# Patient Record
Sex: Male | Born: 1940 | Race: White | Hispanic: No | Smoking: Former smoker
Health system: Southern US, Community
[De-identification: ages and names within clinical notes are randomized; demographics above are authoritative.]

## PROBLEM LIST (undated history)

## (undated) ENCOUNTER — Ambulatory Visit: Admission: EM | Payer: Medicare Other | Source: Home / Self Care

## (undated) DIAGNOSIS — I4510 Unspecified right bundle-branch block: Secondary | ICD-10-CM

## (undated) DIAGNOSIS — E785 Hyperlipidemia, unspecified: Secondary | ICD-10-CM

## (undated) DIAGNOSIS — I480 Paroxysmal atrial fibrillation: Secondary | ICD-10-CM

## (undated) DIAGNOSIS — I10 Essential (primary) hypertension: Secondary | ICD-10-CM

## (undated) DIAGNOSIS — E119 Type 2 diabetes mellitus without complications: Secondary | ICD-10-CM

## (undated) DIAGNOSIS — I2510 Atherosclerotic heart disease of native coronary artery without angina pectoris: Secondary | ICD-10-CM

## (undated) DIAGNOSIS — I739 Peripheral vascular disease, unspecified: Secondary | ICD-10-CM

## (undated) DIAGNOSIS — I255 Ischemic cardiomyopathy: Secondary | ICD-10-CM

## (undated) HISTORY — DX: Hyperlipidemia, unspecified: E78.5

## (undated) HISTORY — DX: Peripheral vascular disease, unspecified: I73.9

## (undated) HISTORY — DX: Essential (primary) hypertension: I10

## (undated) HISTORY — DX: Atherosclerotic heart disease of native coronary artery without angina pectoris: I25.10

## (undated) HISTORY — DX: Type 2 diabetes mellitus without complications: E11.9

## (undated) HISTORY — DX: Unspecified right bundle-branch block: I45.10

## (undated) HISTORY — DX: Ischemic cardiomyopathy: I25.5

## (undated) HISTORY — DX: Paroxysmal atrial fibrillation: I48.0

---

## 1997-10-25 HISTORY — PX: CHOLECYSTECTOMY: SHX55

## 1999-01-16 ENCOUNTER — Ambulatory Visit (HOSPITAL_COMMUNITY): Admission: RE | Admit: 1999-01-16 | Discharge: 1999-01-16 | Payer: Self-pay | Admitting: Cardiology

## 1999-01-16 HISTORY — PX: CARDIAC CATHETERIZATION: SHX172

## 1999-01-29 ENCOUNTER — Ambulatory Visit (HOSPITAL_COMMUNITY): Admission: RE | Admit: 1999-01-29 | Discharge: 1999-01-29 | Payer: Self-pay | Admitting: Cardiology

## 1999-01-29 ENCOUNTER — Encounter: Payer: Self-pay | Admitting: Cardiology

## 1999-05-21 ENCOUNTER — Ambulatory Visit (HOSPITAL_COMMUNITY): Admission: RE | Admit: 1999-05-21 | Discharge: 1999-05-21 | Payer: Self-pay | Admitting: Internal Medicine

## 1999-05-21 ENCOUNTER — Encounter: Payer: Self-pay | Admitting: Internal Medicine

## 1999-05-21 HISTORY — PX: CARDIAC ELECTROPHYSIOLOGY STUDY AND ABLATION: SHX1294

## 2007-12-28 ENCOUNTER — Ambulatory Visit: Admission: RE | Admit: 2007-12-28 | Discharge: 2007-12-28 | Payer: Self-pay | Admitting: Family Medicine

## 2007-12-28 ENCOUNTER — Encounter (INDEPENDENT_AMBULATORY_CARE_PROVIDER_SITE_OTHER): Payer: Self-pay | Admitting: Orthopedic Surgery

## 2007-12-28 ENCOUNTER — Ambulatory Visit: Payer: Self-pay | Admitting: Vascular Surgery

## 2008-01-09 ENCOUNTER — Ambulatory Visit: Payer: Self-pay | Admitting: Vascular Surgery

## 2008-01-15 ENCOUNTER — Ambulatory Visit (HOSPITAL_COMMUNITY): Admission: RE | Admit: 2008-01-15 | Discharge: 2008-01-15 | Payer: Self-pay | Admitting: Vascular Surgery

## 2010-09-22 ENCOUNTER — Ambulatory Visit: Payer: Self-pay | Admitting: Cardiology

## 2010-11-15 ENCOUNTER — Encounter: Payer: Self-pay | Admitting: Vascular Surgery

## 2011-02-05 ENCOUNTER — Other Ambulatory Visit: Payer: Self-pay | Admitting: *Deleted

## 2011-02-05 DIAGNOSIS — I739 Peripheral vascular disease, unspecified: Secondary | ICD-10-CM

## 2011-02-05 MED ORDER — CILOSTAZOL 100 MG PO TABS: 100.0000 mg | ORAL_TABLET | Freq: Two times a day (BID) | ORAL | Status: DC

## 2011-02-05 NOTE — Telephone Encounter (Signed)
escribe medication per fax request  

## 2011-03-09 NOTE — Op Note (Signed)
NAME:  ANDRETTI, STONES                  ACCOUNT NO.:  [id number]   MEDICAL RECORD NO.:  [id number]          PATIENT TYPE:  AMB   LOCATION:  SDS                          FACILITY:  [facility]   PHYSICIAN:  Judeth Cornfield. Scot Dock, M.D.DATE OF BIRTH:  January 07, 1941   DATE OF PROCEDURE:  01/15/2008  DATE OF DISCHARGE:                               OPERATIVE REPORT   PREOPERATIVE DIAGNOSIS:  Rest pain of the right foot, with infrainguinal  arterial occlusive disease   POSTOPERATIVE DIAGNOSIS:  Rest pain of the right foot, with  infrainguinal arterial occlusive disease.   PROCEDURES:  1. Ultrasound-guided left common femoral artery access.  2. Aortogram, with bilateral iliac arteriogram and bilateral lower      extremity runoff.  3. Selective catheterization of the right external iliac artery.   SURGEON:  Judeth Cornfield. Scot Dock, M.D.   ASSISTANT:  Nurse.   ANESTHESIA:  Local, with sedation.   INDICATIONS:  This is a 70 year old gentleman who presented with  progressive ischemic symptoms in his right lower extremity.  He had  claudication, which progressed to the point of rest pain.  His symptoms  had come on gradually.  He had an ABI of 43% on the right, and by exam  he had evidence of infrainguinal arterial occlusive disease.  He was  brought in for diagnostic arteriography to determine what his options  would be for revascularization.   TECHNIQUE:  The patient was taken to the PV lab at [facility] and sedated with  1 mg of Versed and 50 mcg of fentanyl.  Both groins were prepped and  draped in the usual sterile fashion.  The skin was anesthetized with 1%  lidocaine on the left, and under ultrasound guidance the left common  femoral artery was cannulated and a guidewire introduced into the  infrarenal aorta under fluoroscopic control.  The tract over the wire  was dilated, and then a dilator and peel-away sheath were advanced over  the wire, and the dilator was removed.  The pigtail catheter was  positioned at the L1 vertebral body and flush aortogram obtained.  Catheter was then repositioned above the aortic bifurcation, and oblique  iliac projections were obtained.  Next, the pigtail catheter was  exchanged for an IMA catheter, which was positioned into the right  external iliac artery.  An angled Glidewire was advanced down into the  external iliac artery, and then the IMA catheter was exchanged for an  end-hole catheter, which was passed down into the external iliac artery.  Selective right external iliac arteriogram was obtained, with additional  spot films obtained of the right leg.  This catheter was then removed  over a wire, and left lower extremity runoff films obtained through the  left femoral sheath.   FINDINGS:  There is a small accessory renal artery on the right.  The  superior renal artery on the right has an approximately 40% proximal  stenosis.  There is a single renal artery on the left, with no  significant stenosis noted.  The infrarenal aorta, bilateral common  iliac arteries, bilateral external iliac arteries, and  bilateral  hypogastric arteries are patent.   On the right side, the common femoral, superficial femoral, and deep  femoral artery are patent.  The superficial femoral artery is then  occluded at the adductor canal, with some mild disease just proximal to  this.  There is reconstitution of a blind popliteal segment below the  knee which is quite diseased.  The anterior tibial, posterior tibial,  and peroneal arteries are occluded.  There is reconstitution of a blind  peroneal segment in the midsegment of the right leg.  The peroneal  artery is then occluded.  There is then reconstitution of the dorsalis  pedis artery in the right foot.  The artery is somewhat small but  patent.   On the left side, the common femoral, superficial femoral, and deep  femoral arteries are patent.  There is some mild diffuse disease of the  popliteal artery on the  left.  The popliteal artery is then occluded  below the knee.  The anterior tibial, posterior tibial, and peroneal  arteries are occluded.  Again, there is reconstitution of a blind  peroneal segment and the mid to distal third of the left leg.  There is  also reconstitution of the dorsalis pedis artery in the foot, which is  small but patent.   CONCLUSIONS:  1. Right superficial femoral artery occlusion at the adductor canal,      with severe tibial occlusive disease.  2. Occluded popliteal artery on the left, with severe tibial occlusive      disease.      Judeth Cornfield. Scot Dock, M.D.  Electronically Signed     CSD/MEDQ  D:  01/15/2008  T:  01/15/2008  Job:  [id number]   cc:   Claris Gower, M.D.

## 2011-03-09 NOTE — Consult Note (Signed)
NEW PATIENT CONSULTATION   Steve [name]  DOB:  01-18-1941                                       01/09/2008  [id number]   I saw the patient in the office today in consultation concerning  claudication in the right lower extremity and rest pain in the right  foot.  He was referred by Dr. Arelia Sneddon.  This is a pleasant 70 year old  gentleman who states that he has had some right calf claudication for  some time, but over the last 3 weeks, his symptoms have gradually become  worse.  He experiences pain in the right calf associated with ambulating  less than 20 yards.  This pain is relieved with rest.  He has also  developed some rest pain in the right foot.  He has no history of non-  healing ulcers.  He has had no significant thigh or hip claudication on  the right.  He has had no significant symptoms in the left leg.  He was  having some significant pain in his right great toe, but this pain has  improved.   PAST MEDICAL HISTORY:  Significant for non-insulin-dependent diabetes.  He was diagnosed with this in 2001.  He additionally has hypertension.  He also has elevated triglycerides.  He denies any history of previous  myocardial infarction, history of congestive heart failure, or history  of COPD.   FAMILY HISTORY:  There is no history of premature cardiovascular  disease.  His mother died from an intracranial bleed at age 31.   SOCIAL HISTORY:  He is married.  He has 1 child.  He does not smoke  tobacco.  He has 1 to 2 drinks a day.   REVIEW OF SYSTEMS AND MEDICATIONS:  Documented on the medical history  form in his chart.   PHYSICAL EXAMINATION:  This is a pleasant 69 year old gentleman who  appears his stated age.  Blood pressure is 147/83, heart rate is 72.  I  do not detect any carotid bruits.  There is no cervical lymphadenopathy.  Neck is supple.  Lungs are clear bilaterally to auscultation.  On  cardiac exam, he has a regular rate and rhythm.   Abdomen is soft and  nontender . I cannot palpate an aneurysm.  He has normal-pitched bowel  sounds.  He has palpable femoral pulses, which are normal.  I cannot  palpate a right popliteal or pedal pulse on the right.  He does have a  palpable left popliteal pulse with no pedal pulses on the left.  Both  feet are somewhat cool.  He has no ischemic ulcers.  He has no  significant lower extremity swelling.  Neurologic exam is nonfocal.   He did have a Doppler study done at [facility], which showed an ABI of  43% on the right and 76% on the left.  Great toe pressure on the right  was 30 mmHg and on the left 50 mmHg.   This patient has evidence of infrainguinal arterial occlusive disease  bilaterally, but more significantly on the right side.  Given that his  symptoms have progressed to the point of rest pain, I have recommended  that we proceed with arteriography to see what options he has for  revascularization.  I suspect that he has diffuse multilevel disease and  is likely not a good candidate for  endovascular approach.  We have  discussed the indications for arteriography and the potential  complications, including, but not limited to bleeding, arterial injury.  We have also discussed potentially proceeding with angioplasty if we  find a lesion amenable to angioplasty.  Again, we have discussed the  potential complications associated with this.  All of his questions are  answered and he is agreeable to proceed.  His procedure has been  scheduled for 01/21/2008.   Judeth Cornfield. Scot Dock, M.D.  Electronically Signed   CSD/MEDQ  D:  01/09/2008  T:  01/10/2008  Job:  [id number]   cc:   Claris Gower, M.D.

## 2011-03-11 ENCOUNTER — Other Ambulatory Visit: Payer: Self-pay | Admitting: Cardiology

## 2011-03-12 NOTE — Telephone Encounter (Signed)
escribe medication per fax request  

## 2011-05-27 ENCOUNTER — Encounter: Payer: Self-pay | Admitting: *Deleted

## 2011-05-31 ENCOUNTER — Encounter: Payer: Self-pay | Admitting: Cardiology

## 2011-05-31 ENCOUNTER — Ambulatory Visit (INDEPENDENT_AMBULATORY_CARE_PROVIDER_SITE_OTHER): Payer: Medicare Other | Admitting: Cardiology

## 2011-05-31 DIAGNOSIS — I739 Peripheral vascular disease, unspecified: Secondary | ICD-10-CM | POA: Insufficient documentation

## 2011-05-31 DIAGNOSIS — I10 Essential (primary) hypertension: Secondary | ICD-10-CM

## 2011-05-31 DIAGNOSIS — E785 Hyperlipidemia, unspecified: Secondary | ICD-10-CM

## 2011-05-31 DIAGNOSIS — E119 Type 2 diabetes mellitus without complications: Secondary | ICD-10-CM | POA: Insufficient documentation

## 2011-05-31 NOTE — Progress Notes (Signed)
Steve [name] Date of Birth: April 21, 1941   History of Present Illness: Steve [name] is seen today for followup. He states he is feeling very well. He still has discomfort in his feet and legs particularly at night with a burning sensation. His claudication symptoms are actually improved. It was noted on his recent blood work that his triglycerides were elevated to 334. His weight has also increased 3 pounds. He attributes this to eating too much sweets. His last A1c was 6%. He denies any chest pain or shortness of breath.  Current Outpatient Prescriptions on File Prior to Visit  Medication Sig Dispense Refill  . amLODipine (NORVASC) 5 MG tablet Take 5 mg by mouth daily.        Marland Kitchen atenolol (TENORMIN) 100 MG tablet Take 100 mg by mouth daily.        . cilostazol (PLETAL) 100 MG tablet TAKE 1 TABLET BY MOUTH TWICE A DAY  60 tablet  5  . fenofibrate micronized (LOFIBRA) 200 MG capsule Take 200 mg by mouth daily before breakfast.        . glyBURIDE (DIABETA) 5 MG tablet Take 5 mg by mouth 2 (two) times daily with a meal.        . lisinopril (PRINIVIL,ZESTRIL) 10 MG tablet Take 10 mg by mouth daily.        Marland Kitchen METFORMIN HCL PO Take by mouth.        . niacin 500 MG tablet Take 500 mg by mouth daily with breakfast.          No Known Allergies  Past Medical History  Diagnosis Date  . Diabetes mellitus     type 2  . Hypertension   . Peripheral arterial disease   . Hyperlipidemia     Past Surgical History  Procedure Date  . Cholecystectomy 1999  . Cardiac catheterization 01/16/1999    Est. EF of 65% -- Nonobstruction atherosclerotic coronary artery disease -- Normal left ventricular function      . Cardiac electrophysiology study and ablation 05/21/1999    Normal sinus funtion -- Mildly prolonged interatrial conduction times -- Normal A-V node funtion -- Normal His Purkinje system function -- fNo accessory pathway -- No inducible ventricular tachycardia in the presence of the or in the absence of  isoproterenol with programmed stimulation or with burst pacing -- Nikki Dom, M.D. FACC surgan            History  Smoking status  . Former Smoker -- 1.5 packs/day for 20 years  . Types: Cigarettes  . Quit date: 05/27/1979  Smokeless tobacco  . Not on file    History  Alcohol Use  . Yes    Family History  Problem Relation Age of Onset  . Stroke Mother 23    Cerebellar hemorrhage    Review of Systems: The review of systems is positive for exertional calf discomfort which has improved.  All other systems were reviewed and are negative.  Physical Exam: BP 138/76  Pulse 64  Ht 5\' 11"  (1.803 m)  Wt 205 lb 6.4 oz (93.169 kg)  BMI 28.65 kg/m2 Is an obese white male in no acute distress. His HEENT exam is unremarkable. He has no JVD, adenopathy, thyromegaly, or bruits. Lungs are clear. Cardiac exam reveals a regular rate and rhythm without gallop, murmur, or click. Abdomen is obese, soft, nontender. He has no masses or bruits. Extremities are without edema. He has poor pedal pulses. Skin is warm and dry. He  is alert and oriented x4. Cranial nerves II through XII are intact. LABORATORY DATA:   Assessment / Plan:

## 2011-05-31 NOTE — Patient Instructions (Signed)
Continue your current medications.  Cut out the sweets.  I will see you again in 6 months.

## 2011-05-31 NOTE — Assessment & Plan Note (Signed)
His claudication symptoms are improved on Pletal. Continue regular walking program.

## 2011-05-31 NOTE — Assessment & Plan Note (Signed)
Recent lipid panel showed total cholesterol 168, HDL 28, and triglycerides of 334. I stressed the importance of dietary modification with particular attention to avoid sweets. He needs to lose weight. He needs to continue aerobic activity.

## 2011-05-31 NOTE — Assessment & Plan Note (Signed)
Blood pressure is well controlled today. We'll continue with his lisinopril, amlodipine, and atenolol.

## 2011-07-19 LAB — COMPREHENSIVE METABOLIC PANEL
AST: 40 — ABNORMAL HIGH
Albumin: 4.1
Alkaline Phosphatase: 27 — ABNORMAL LOW
CO2: 23
Chloride: 102
Creatinine, Ser: 1.24
GFR calc Af Amer: >60
GFR calc non Af Amer: 58 — ABNORMAL LOW
Potassium: 3.8
Total Bilirubin: 0.8

## 2011-07-19 LAB — CBC
HCT: 37.7 — ABNORMAL LOW
MCV: 93.3
RBC: 4.04 — ABNORMAL LOW
WBC: 5.1

## 2011-11-30 ENCOUNTER — Other Ambulatory Visit: Payer: Self-pay | Admitting: *Deleted

## 2011-11-30 MED ORDER — CILOSTAZOL 100 MG PO TABS: 100.0000 mg | ORAL_TABLET | Freq: Two times a day (BID) | ORAL | Status: DC

## 2011-12-07 ENCOUNTER — Ambulatory Visit (INDEPENDENT_AMBULATORY_CARE_PROVIDER_SITE_OTHER): Payer: Medicare Other | Admitting: Cardiology

## 2011-12-07 ENCOUNTER — Other Ambulatory Visit: Payer: Self-pay | Admitting: Cardiology

## 2011-12-07 ENCOUNTER — Ambulatory Visit (INDEPENDENT_AMBULATORY_CARE_PROVIDER_SITE_OTHER)
Admission: RE | Admit: 2011-12-07 | Discharge: 2011-12-07 | Disposition: A | Discharge status: Home / Self Care | Payer: Medicare Other | Source: Ambulatory Visit | Attending: Cardiology | Admitting: Cardiology

## 2011-12-07 ENCOUNTER — Encounter (HOSPITAL_COMMUNITY): Payer: Self-pay | Admitting: Pharmacy Technician

## 2011-12-07 ENCOUNTER — Encounter: Payer: Self-pay | Admitting: Cardiology

## 2011-12-07 DIAGNOSIS — Z01818 Encounter for other preprocedural examination: Secondary | ICD-10-CM

## 2011-12-07 DIAGNOSIS — I10 Essential (primary) hypertension: Secondary | ICD-10-CM

## 2011-12-07 DIAGNOSIS — I200 Unstable angina: Secondary | ICD-10-CM

## 2011-12-07 DIAGNOSIS — E119 Type 2 diabetes mellitus without complications: Secondary | ICD-10-CM

## 2011-12-07 DIAGNOSIS — E785 Hyperlipidemia, unspecified: Secondary | ICD-10-CM

## 2011-12-07 DIAGNOSIS — I779 Disorder of arteries and arterioles, unspecified: Secondary | ICD-10-CM

## 2011-12-07 DIAGNOSIS — I2510 Atherosclerotic heart disease of native coronary artery without angina pectoris: Secondary | ICD-10-CM

## 2011-12-07 DIAGNOSIS — I739 Peripheral vascular disease, unspecified: Secondary | ICD-10-CM

## 2011-12-07 DIAGNOSIS — Z79899 Other long term (current) drug therapy: Secondary | ICD-10-CM

## 2011-12-07 DIAGNOSIS — Z951 Presence of aortocoronary bypass graft: Secondary | ICD-10-CM | POA: Insufficient documentation

## 2011-12-07 LAB — BASIC METABOLIC PANEL
CO2: 22 mEq/L (ref 19–32)
Glucose, Bld: 284 mg/dL — ABNORMAL HIGH (ref 70–99)
Potassium: 4.2 mEq/L (ref 3.5–5.1)
Sodium: 137 mEq/L (ref 135–145)

## 2011-12-07 LAB — CBC WITH DIFFERENTIAL/PLATELET
Basophils Absolute: 0 10*3/uL (ref 0.0–0.1)
Eosinophils Absolute: 0.1 10*3/uL (ref 0.0–0.7)
HCT: 37.9 % — ABNORMAL LOW (ref 39.0–52.0)
Hemoglobin: 13.1 g/dL (ref 13.0–17.0)
Lymphs Abs: 1.2 10*3/uL (ref 0.7–4.0)
MCHC: 34.7 g/dL (ref 30.0–36.0)
Neutro Abs: 4.7 10*3/uL (ref 1.4–7.7)
RDW: 13.1 % (ref 11.5–14.6)

## 2011-12-07 MED ORDER — NITROGLYCERIN 0.4 MG SL SUBL: 0.4000 mg | SUBLINGUAL_TABLET | SUBLINGUAL | Status: DC | PRN

## 2011-12-07 MED ORDER — ASPIRIN EC 81 MG PO TBEC: 81.0000 mg | DELAYED_RELEASE_TABLET | Freq: Every day | ORAL | Status: DC

## 2011-12-07 NOTE — Assessment & Plan Note (Signed)
He has a crescendo pattern of chest pain concerning for unstable angina. He has known coronary disease by cardiac catheterization in 2000 but was nonobstructive at that time. He has multiple cardiac risk factors. Given his very typical symptoms I recommended proceeding directly to cardiac catheterization with potential intervention if indicated. Procedure and risks were discussed in detail. Patient is agreeable to proceed. Baseline blood work and chest x-ray will be obtained today.

## 2011-12-07 NOTE — Assessment & Plan Note (Signed)
He'll need to hold his metformin for cardiac catheterization for 48 hours post procedure.

## 2011-12-07 NOTE — Assessment & Plan Note (Addendum)
Patient does have poor pedal pulses. We may need to consider extremity Doppler studies at a later date.

## 2011-12-07 NOTE — Patient Instructions (Addendum)
We will schedule you for a cardiac cath with possible intervention.  We will get blood work today and a chest Xray.  Take nitroglycerin sublingual prn chest pain. If pain is unresolved call 911.

## 2011-12-07 NOTE — Progress Notes (Signed)
[name] Date of Birth: Jun 18, 1941   History of Present Illness: [name] is seen today for followup. Over the past 2 months he has been experiencing symptoms of increased chest pain. He describes this as this central mid substernal chest pain without radiation. He denies any shortness of breath. He states he can no longer do anything physical without developing chest pain. This includes shoveling snow or raking. Typically if he rests his pain goes away within 15 minutes. He has noticed just walking up incline from his mailbox gives him chest pain. Today he did develop some mild chest discomfort just while drinking coffee. He has a known history of coronary disease with cardiac catheterization in 2000 showing nonobstructive disease. He does have multiple cardiac risk factors including diabetes, hypertension, and hyperlipidemia.  Current Outpatient Prescriptions on File Prior to Visit  Medication Sig Dispense Refill  . amLODipine (NORVASC) 5 MG tablet Take 5 mg by mouth daily.        Marland Kitchen atenolol (TENORMIN) 100 MG tablet Take 100 mg by mouth daily.        . fenofibrate micronized (LOFIBRA) 200 MG capsule Take 200 mg by mouth daily before breakfast.        . glyBURIDE (DIABETA) 5 MG tablet Take 5 mg by mouth 2 (two) times daily with a meal.        . lisinopril (PRINIVIL,ZESTRIL) 10 MG tablet Take 10 mg by mouth daily.        . niacin 500 MG tablet Take 500 mg by mouth daily with breakfast.        . DISCONTD: cilostazol (PLETAL) 100 MG tablet Take 1 tablet (100 mg total) by mouth 2 (two) times daily.  60 tablet  5    No Known Allergies  Past Medical History  Diagnosis Date  . Diabetes mellitus     type 2  . Hypertension   . Peripheral arterial disease   . Hyperlipidemia     Past Surgical History  Procedure Date  . Cholecystectomy 1999  . Cardiac catheterization 01/16/1999    Est. EF of 65% -- Nonobstruction atherosclerotic coronary artery disease -- Normal left ventricular  function      . Cardiac electrophysiology study and ablation 05/21/1999    Normal sinus funtion -- Mildly prolonged interatrial conduction times -- Normal A-V node funtion -- Normal His Purkinje system function -- fNo accessory pathway -- No inducible ventricular tachycardia in the presence of the or in the absence of isoproterenol with programmed stimulation or with burst pacing -- Nikki Dom, M.D. FACC surgan            History  Smoking status  . Former Smoker -- 1.5 packs/day for 20 years  . Types: Cigarettes  . Quit date: 05/27/1979  Smokeless tobacco  . Not on file    History  Alcohol Use  . Yes    Family History  Problem Relation Age of Onset  . Stroke Mother 68    Cerebellar hemorrhage    Review of Systems: The review of systems is positive for some exertional calf discomfort. He describes a burning sensation on the soles of his feet and at times his toes feel like they're on fire. He only has cramping in his calves at night. All other systems were reviewed and are negative.  Physical Exam: BP 124/66  Pulse 72  Ht 5\' 11"  (1.803 m)  Wt 205 lb 3.2 oz (93.078 kg)  BMI 28.62 kg/m2 Is an  obese white male in no acute distress. His HEENT exam is unremarkable. He has no JVD, adenopathy, thyromegaly, or bruits. Lungs are clear. Cardiac exam reveals a regular rate and rhythm without gallop, murmur, or click. Abdomen is obese, soft, nontender. He has no masses or bruits. Extremities are without edema. He has poor pedal pulses. Skin is warm and dry. He is alert and oriented x4. Cranial nerves II through XII are intact. LABORATORY DATA: ECG today demonstrates normal sinus rhythm with a left anterior fascicular block. He has voltage criteria for LVH. He has nonspecific T-wave abnormality.  Assessment / Plan:

## 2011-12-07 NOTE — Assessment & Plan Note (Signed)
He has a history of statin intolerance. He is now on fenofibrate and niacin.

## 2011-12-09 ENCOUNTER — Inpatient Hospital Stay (HOSPITAL_COMMUNITY)
Admission: RE | Admit: 2011-12-09 | Discharge: 2011-12-17 | DRG: 234 | Disposition: A | Discharge status: Home / Self Care | Payer: Medicare Other | Source: Ambulatory Visit | Attending: Cardiothoracic Surgery | Admitting: Cardiothoracic Surgery

## 2011-12-09 ENCOUNTER — Encounter (HOSPITAL_COMMUNITY)
Admission: RE | Disposition: A | Discharge status: Home / Self Care | Payer: Self-pay | Source: Ambulatory Visit | Attending: Cardiothoracic Surgery

## 2011-12-09 ENCOUNTER — Other Ambulatory Visit: Payer: Self-pay

## 2011-12-09 DIAGNOSIS — D62 Acute posthemorrhagic anemia: Secondary | ICD-10-CM | POA: Diagnosis not present

## 2011-12-09 DIAGNOSIS — E785 Hyperlipidemia, unspecified: Secondary | ICD-10-CM | POA: Diagnosis present

## 2011-12-09 DIAGNOSIS — I200 Unstable angina: Secondary | ICD-10-CM | POA: Diagnosis present

## 2011-12-09 DIAGNOSIS — I10 Essential (primary) hypertension: Secondary | ICD-10-CM | POA: Diagnosis present

## 2011-12-09 DIAGNOSIS — D696 Thrombocytopenia, unspecified: Secondary | ICD-10-CM | POA: Diagnosis not present

## 2011-12-09 DIAGNOSIS — K5900 Constipation, unspecified: Secondary | ICD-10-CM | POA: Diagnosis not present

## 2011-12-09 DIAGNOSIS — I2510 Atherosclerotic heart disease of native coronary artery without angina pectoris: Principal | ICD-10-CM | POA: Diagnosis present

## 2011-12-09 DIAGNOSIS — Z951 Presence of aortocoronary bypass graft: Secondary | ICD-10-CM | POA: Diagnosis present

## 2011-12-09 DIAGNOSIS — Z01810 Encounter for preprocedural cardiovascular examination: Secondary | ICD-10-CM

## 2011-12-09 DIAGNOSIS — Z87891 Personal history of nicotine dependence: Secondary | ICD-10-CM

## 2011-12-09 DIAGNOSIS — I471 Supraventricular tachycardia, unspecified: Secondary | ICD-10-CM | POA: Diagnosis not present

## 2011-12-09 DIAGNOSIS — I4891 Unspecified atrial fibrillation: Secondary | ICD-10-CM | POA: Diagnosis not present

## 2011-12-09 DIAGNOSIS — I4949 Other premature depolarization: Secondary | ICD-10-CM | POA: Diagnosis not present

## 2011-12-09 DIAGNOSIS — I739 Peripheral vascular disease, unspecified: Secondary | ICD-10-CM | POA: Diagnosis present

## 2011-12-09 DIAGNOSIS — E119 Type 2 diabetes mellitus without complications: Secondary | ICD-10-CM | POA: Diagnosis present

## 2011-12-09 DIAGNOSIS — E8779 Other fluid overload: Secondary | ICD-10-CM | POA: Diagnosis not present

## 2011-12-09 HISTORY — PX: LEFT HEART CATHETERIZATION WITH CORONARY ANGIOGRAM: SHX5451

## 2011-12-09 LAB — BLOOD GAS, ARTERIAL
Acid-base deficit: 2.6 mmol/L — ABNORMAL HIGH (ref 0.0–2.0)
Bicarbonate: 21.2 mEq/L (ref 20.0–24.0)
Drawn by: 352351
FIO2: 0.21 %
O2 Saturation: 96.1 %
Patient temperature: 97.8
TCO2: 22.2 mmol/L (ref 0–100)
pCO2 arterial: 32.7 mmHg — ABNORMAL LOW (ref 35.0–45.0)
pH, Arterial: 7.424 (ref 7.350–7.450)
pO2, Arterial: 75 mmHg — ABNORMAL LOW (ref 80.0–100.0)

## 2011-12-09 LAB — URINALYSIS, ROUTINE W REFLEX MICROSCOPIC
Bilirubin Urine: NEGATIVE
Glucose, UA: 100 mg/dL — AB
Hgb urine dipstick: NEGATIVE
Ketones, ur: NEGATIVE mg/dL
Nitrite: NEGATIVE
Protein, ur: NEGATIVE mg/dL
Specific Gravity, Urine: 1.033 — ABNORMAL HIGH (ref 1.005–1.030)
Urobilinogen, UA: 1 mg/dL (ref 0.0–1.0)
pH: 7 (ref 5.0–8.0)

## 2011-12-09 LAB — URINE MICROSCOPIC-ADD ON

## 2011-12-09 LAB — PREPARE RBC (CROSSMATCH)

## 2011-12-09 LAB — MRSA PCR SCREENING: MRSA by PCR: NEGATIVE

## 2011-12-09 LAB — GLUCOSE, CAPILLARY
Glucose-Capillary: 249 mg/dL — ABNORMAL HIGH (ref 70–99)
Glucose-Capillary: 192 mg/dL — ABNORMAL HIGH (ref 70–99)

## 2011-12-09 SURGERY — LEFT HEART CATHETERIZATION WITH CORONARY ANGIOGRAM
Anesthesia: LOCAL

## 2011-12-09 MED ORDER — LIDOCAINE HCL (PF) 1 % IJ SOLN
INTRAMUSCULAR | Status: AC
  Filled 2011-12-09: qty 30

## 2011-12-09 MED ORDER — CHLORHEXIDINE GLUCONATE 4 % EX LIQD
60.0000 mL | Freq: Once | CUTANEOUS | Status: DC
  Filled 2011-12-09: qty 60

## 2011-12-09 MED ORDER — CEFUROXIME SODIUM 1.5 G IJ SOLR
1.5000 g | INTRAMUSCULAR | Status: AC
  Administered 2011-12-10: 1.5 g via INTRAVENOUS
  Filled 2011-12-09: qty 1.5

## 2011-12-09 MED ORDER — FENOFIBRATE 160 MG PO TABS
160.0000 mg | ORAL_TABLET | Freq: Every day | ORAL | Status: DC
  Administered 2011-12-12 – 2011-12-17 (×6): 160 mg via ORAL
  Filled 2011-12-09 (×9): qty 1

## 2011-12-09 MED ORDER — SODIUM CHLORIDE 0.9 % IV SOLN
0.1000 ug/kg/h | INTRAVENOUS | Status: AC
  Administered 2011-12-10: 0.6 ug/kg/h via INTRAVENOUS
  Filled 2011-12-09: qty 4

## 2011-12-09 MED ORDER — SODIUM CHLORIDE 0.9 % IV SOLN: 250.0000 mL | INTRAVENOUS | Status: DC | PRN

## 2011-12-09 MED ORDER — NITROGLYCERIN 0.2 MG/ML ON CALL CATH LAB
INTRAVENOUS | Status: AC
  Filled 2011-12-09: qty 1

## 2011-12-09 MED ORDER — DIAZEPAM 5 MG PO TABS
5.0000 mg | ORAL_TABLET | ORAL | Status: AC
  Administered 2011-12-09: 5 mg via ORAL

## 2011-12-09 MED ORDER — ASPIRIN 81 MG PO CHEW
CHEWABLE_TABLET | ORAL | Status: AC
  Filled 2011-12-09: qty 1

## 2011-12-09 MED ORDER — DIAZEPAM 5 MG PO TABS
ORAL_TABLET | ORAL | Status: AC
  Filled 2011-12-09: qty 1

## 2011-12-09 MED ORDER — ACETAMINOPHEN 325 MG PO TABS: 650.0000 mg | ORAL_TABLET | ORAL | Status: DC | PRN

## 2011-12-09 MED ORDER — CHLORHEXIDINE GLUCONATE 4 % EX LIQD
60.0000 mL | Freq: Once | CUTANEOUS | Status: AC
  Administered 2011-12-10: 4 via TOPICAL
  Filled 2011-12-09: qty 60

## 2011-12-09 MED ORDER — NITROGLYCERIN 0.4 MG SL SUBL: 0.4000 mg | SUBLINGUAL_TABLET | SUBLINGUAL | Status: DC | PRN

## 2011-12-09 MED ORDER — ASPIRIN 81 MG PO CHEW
324.0000 mg | CHEWABLE_TABLET | ORAL | Status: AC
  Administered 2011-12-09: 324 mg via ORAL

## 2011-12-09 MED ORDER — INSULIN GLARGINE 100 UNIT/ML ~~LOC~~ SOLN
20.0000 [IU] | Freq: Every day | SUBCUTANEOUS | Status: DC
  Filled 2011-12-09: qty 3

## 2011-12-09 MED ORDER — DEXTROSE 5 % IV SOLN
750.0000 mg | INTRAVENOUS | Status: DC
  Filled 2011-12-09: qty 750

## 2011-12-09 MED ORDER — ASPIRIN 81 MG PO CHEW
CHEWABLE_TABLET | ORAL | Status: AC
  Filled 2011-12-09: qty 4

## 2011-12-09 MED ORDER — CHLORHEXIDINE GLUCONATE 4 % EX LIQD
60.0000 mL | Freq: Once | CUTANEOUS | Status: AC
  Administered 2011-12-09: 4 via TOPICAL
  Filled 2011-12-09: qty 60

## 2011-12-09 MED ORDER — SODIUM CHLORIDE 0.9 % IV SOLN
INTRAVENOUS | Status: DC
  Administered 2011-12-09: 100 mL/h via INTRAVENOUS

## 2011-12-09 MED ORDER — ASPIRIN EC 81 MG PO TBEC
81.0000 mg | DELAYED_RELEASE_TABLET | Freq: Every day | ORAL | Status: DC
  Filled 2011-12-09: qty 1

## 2011-12-09 MED ORDER — DEXTROSE 5 % IV SOLN: 750.0000 mg | INTRAVENOUS | Status: DC

## 2011-12-09 MED ORDER — SODIUM CHLORIDE 0.9 % IJ SOLN: 3.0000 mL | Freq: Two times a day (BID) | INTRAMUSCULAR | Status: DC

## 2011-12-09 MED ORDER — BISACODYL 5 MG PO TBEC
5.0000 mg | DELAYED_RELEASE_TABLET | Freq: Once | ORAL | Status: AC
  Administered 2011-12-09: 5 mg via ORAL
  Filled 2011-12-09: qty 1

## 2011-12-09 MED ORDER — DOPAMINE-DEXTROSE 3.2-5 MG/ML-% IV SOLN
2.0000 ug/kg/min | INTRAVENOUS | Status: DC
  Filled 2011-12-09: qty 250

## 2011-12-09 MED ORDER — SODIUM CHLORIDE 0.9 % IV SOLN
INTRAVENOUS | Status: AC
  Administered 2011-12-10: 70 mL/h via INTRAVENOUS
  Filled 2011-12-09: qty 40

## 2011-12-09 MED ORDER — LISINOPRIL 10 MG PO TABS
10.0000 mg | ORAL_TABLET | Freq: Every day | ORAL | Status: DC
  Filled 2011-12-09: qty 1

## 2011-12-09 MED ORDER — FENTANYL CITRATE 0.05 MG/ML IJ SOLN
INTRAMUSCULAR | Status: AC
  Filled 2011-12-09: qty 2

## 2011-12-09 MED ORDER — AMLODIPINE BESYLATE 5 MG PO TABS
5.0000 mg | ORAL_TABLET | Freq: Every day | ORAL | Status: DC
  Filled 2011-12-09: qty 1

## 2011-12-09 MED ORDER — SODIUM CHLORIDE 0.9 % IV SOLN
INTRAVENOUS | Status: AC
  Administered 2011-12-10: 4.4 [IU]/h via INTRAVENOUS
  Filled 2011-12-09: qty 1

## 2011-12-09 MED ORDER — HEPARIN (PORCINE) IN NACL 2-0.9 UNIT/ML-% IJ SOLN
INTRAMUSCULAR | Status: AC
  Filled 2011-12-09: qty 2000

## 2011-12-09 MED ORDER — MIDAZOLAM HCL 2 MG/2ML IJ SOLN
INTRAMUSCULAR | Status: AC
  Filled 2011-12-09: qty 2

## 2011-12-09 MED ORDER — GLYBURIDE 5 MG PO TABS
5.0000 mg | ORAL_TABLET | Freq: Two times a day (BID) | ORAL | Status: DC
  Administered 2011-12-09: 5 mg via ORAL
  Filled 2011-12-09 (×4): qty 1

## 2011-12-09 MED ORDER — POTASSIUM CHLORIDE 2 MEQ/ML IV SOLN
80.0000 meq | INTRAVENOUS | Status: DC
  Filled 2011-12-09: qty 40

## 2011-12-09 MED ORDER — ALPRAZOLAM 0.25 MG PO TABS: 0.2500 mg | ORAL_TABLET | ORAL | Status: DC | PRN

## 2011-12-09 MED ORDER — EPINEPHRINE HCL 1 MG/ML IJ SOLN
0.5000 ug/min | INTRAVENOUS | Status: DC
  Filled 2011-12-09: qty 4

## 2011-12-09 MED ORDER — TEMAZEPAM 15 MG PO CAPS: 15.0000 mg | ORAL_CAPSULE | Freq: Once | ORAL | Status: AC | PRN

## 2011-12-09 MED ORDER — ATENOLOL 100 MG PO TABS
100.0000 mg | ORAL_TABLET | Freq: Every day | ORAL | Status: DC
  Filled 2011-12-09: qty 1

## 2011-12-09 MED ORDER — DIAZEPAM 5 MG PO TABS
5.0000 mg | ORAL_TABLET | Freq: Once | ORAL | Status: AC
  Administered 2011-12-10: 5 mg via ORAL
  Filled 2011-12-09: qty 1

## 2011-12-09 MED ORDER — PHENYLEPHRINE HCL 10 MG/ML IJ SOLN
30.0000 ug/min | INTRAVENOUS | Status: DC
  Filled 2011-12-09: qty 2

## 2011-12-09 MED ORDER — ONDANSETRON HCL 4 MG/2ML IJ SOLN: 4.0000 mg | Freq: Four times a day (QID) | INTRAMUSCULAR | Status: DC | PRN

## 2011-12-09 MED ORDER — SODIUM CHLORIDE 0.9 % IJ SOLN: 3.0000 mL | INTRAMUSCULAR | Status: DC | PRN

## 2011-12-09 MED ORDER — VERAPAMIL HCL 2.5 MG/ML IV SOLN
INTRAVENOUS | Status: AC
  Administered 2011-12-10: 16:00:00
  Filled 2011-12-09 (×2): qty 2.5

## 2011-12-09 MED ORDER — NIACIN 500 MG PO TABS
500.0000 mg | ORAL_TABLET | Freq: Every day | ORAL | Status: DC
  Administered 2011-12-10 – 2011-12-16 (×3): 500 mg via ORAL
  Filled 2011-12-09 (×10): qty 1

## 2011-12-09 MED ORDER — SODIUM CHLORIDE 0.9 % IV SOLN
1.0000 mL/kg/h | INTRAVENOUS | Status: DC
  Administered 2011-12-09: 1 mL/kg/h via INTRAVENOUS

## 2011-12-09 MED ORDER — MAGNESIUM SULFATE 50 % IJ SOLN
40.0000 meq | INTRAMUSCULAR | Status: DC
  Filled 2011-12-09: qty 10

## 2011-12-09 MED ORDER — CILOSTAZOL 100 MG PO TABS
100.0000 mg | ORAL_TABLET | Freq: Two times a day (BID) | ORAL | Status: DC
  Filled 2011-12-09: qty 1

## 2011-12-09 MED ORDER — NITROGLYCERIN IN D5W 200-5 MCG/ML-% IV SOLN
2.0000 ug/min | INTRAVENOUS | Status: AC
  Administered 2011-12-10: 10 ug/min via INTRAVENOUS
  Filled 2011-12-09: qty 250

## 2011-12-09 MED ORDER — METOPROLOL TARTRATE 12.5 MG HALF TABLET
12.5000 mg | ORAL_TABLET | Freq: Once | ORAL | Status: AC
  Administered 2011-12-10: 12.5 mg via ORAL
  Filled 2011-12-09: qty 1

## 2011-12-09 MED ORDER — VANCOMYCIN HCL 1000 MG IV SOLR
1500.0000 mg | INTRAVENOUS | Status: AC
  Administered 2011-12-10: 1500 mg via INTRAVENOUS
  Filled 2011-12-09: qty 1500

## 2011-12-09 NOTE — Consults (Signed)
Cardiothoracic surgical consultation  Reason for consultation--severe three-vessel coronary artery disease with unstable angina, normal LV systolic function  Present illness The patient is a 71 year old Caucasian male diabetic ex-smoker with a history of coronary disease and progressive angina. The patient is noted his chest pain to increase in severity and frequency as well as with less amount of exertion. He had a episode of resting pain the day before admission. His cardiac enzymes are negative. He is in a sinus rhythm. Cardiac catheterization by Dr. Martinique demonstrated a 60% left main stenosis with a 90% ostial LAD stenosis 50% circumflex stenosis and a 80% stenosis of a codominant right coronary. EF was 60%. LVEDP 10 mmHg. The patient was recommended for surgical coronary revascularization.  Past medical history  Diabetes mellitus last A1c 6.1 Peripheral vascular disease bilateral SFA occlusion by arteriogram Hypertension No known drug allergies  Social history patient is retired Nature conservation officer. Is not smoking 30 years. He is married with adult shoulder. He remains very active around the house and yard. Alcohol intake minimal.  Family history--positive for diabetes mellitus, hypertension  Review of systems Constitutional review negative for fever weight loss HEENT review negative for dental complaints or difficulty swallowing he is edentulous Thoracic negative for history of thoracic trauma recent pneumonia abnormal chest x-ray Cardiac history of CAD, no history of murmur, remote history of arrhythmia for which he underwent EP mapping but no arrhythmia was elicited and an AICD was not placed Gastrointestinal status post cholecystectomy no history of blood per rectum or jaundice Endocrine positive diabetes negative for thyroid disease last hemoglobin A1c 6.1 vascular bilateral femoral disease with ABI 0.5 on the left 0.4 on the right Musculoskeletal no significant injuries to the lower  extremities  Neurologic no stroke or seizure physical exam  Physical exam  Vital signs blood pressure 130/70 pulse 80 regular saturation 97% on room air weight 205 BMI 28.7 General appearance 71 year old male his hospital room following cath in no distress HEENT pupils equal edentulous normocephalic Neck without JVD or bruit Lymphatics no palpable adenopathy in the neck Thorax no deformity or tenderness clear breath sounds Cardiac regular rhythm without murmur or gallop Abdomen soft nontender without pulsatile mass Extremities mild clubbing no tenderness cyanosis or edema Vascular nonpalpable pedal pulses but extremities warm with slight atrophic skin changes Neurologic alert and oriented without focal motor deficit  Laboratory data  Coronary. Is reviewed and he has severe left main and multivessel disease. Plan multivessel bypass grafting every 15. The procedure indications benefits risks and been discussed the patient he understands and agrees to proceed.

## 2011-12-09 NOTE — Op Note (Signed)
   Cardiac Catheterization Procedure Note  Name: Steve [name]. MRN: [id number] DOB: 1940/11/22  Procedure: Left Heart Cath, Selective Coronary Angiography, LV angiography  Indication: 71 year old white male with history of diabetes, hypertension, hyperlipidemia, and peripheral arterial disease presents with symptoms of new-onset angina. Symptoms began with exertion but are now occurring at rest.   Procedural details: The right groin was prepped, draped, and anesthetized with 1% lidocaine. Using modified Seldinger technique, a 5 French sheath was introduced into the left femoral artery. Standard Judkins catheters were used for coronary angiography and left ventriculography. Catheter exchanges were performed over a guidewire. There were no immediate procedural complications. The patient was transferred to the post catheterization recovery area for further monitoring.  Procedural Findings: Hemodynamics:  AO 121/57 with a mean of 79 mmHg. There is no significant aortic valve gradient. LV 122/14 mmHg   Coronary angiography: Coronary dominance: Left  Left mainstem: The left main coronary is heavily calcified. There is 50% narrowing in the distal left main.  Left anterior descending (LAD): This is a large vessel which is heavily calcified proximally. There is a 90-95% stenosis at the ostium. There is TIMI grade 2 flow to the distal LAD.  There is a ramus intermediate branch which has a 50% stenosis proximally.  Left circumflex (LCx): This is a large dominant vessel. There is 50% narrowing at the ostium at its takeoff from the left main. Otherwise no significant disease noted.  Right coronary artery (RCA): This is a nondominant vessel with an 80% stenosis in the mid vessel.  Left ventriculography: Left ventricular systolic function is normal, LVEF is estimated at 55-65%, there is no significant mitral regurgitation   Final Conclusions:   1. Severe obstructive atherosclerotic coronary  disease. There is moderate narrowing of the distal left main coronary. There is a critical ostial LAD stenosis. 2. Normal left ventricular function.  Recommendations: Given the recent acceleration of his chest pain patient be admitted to telemetry. We'll consult CT surgery for consideration of coronary bypass surgery.  Armandina Iman Martinique 12/09/2011, 1:33 PM

## 2011-12-09 NOTE — Progress Notes (Addendum)
Bilateral carotid artery duplex completed.  Preliminary report is 40-59% (low to mid range) ICA stenosis on the right and no evidence of significant stenosis on the left.  ABI indicates a moderate reduction in arterial flow bilaterally.

## 2011-12-09 NOTE — H&P (View-Only) (Signed)
[name] Date of Birth: 1940-12-06   History of Present Illness: [name] is seen today for followup. Over the past 2 months he has been experiencing symptoms of increased chest pain. He describes this as this central mid substernal chest pain without radiation. He denies any shortness of breath. He states he can no longer do anything physical without developing chest pain. This includes shoveling snow or raking. Typically if he rests his pain goes away within 15 minutes. He has noticed just walking up incline from his mailbox gives him chest pain. Today he did develop some mild chest discomfort just while drinking coffee. He has a known history of coronary disease with cardiac catheterization in 2000 showing nonobstructive disease. He does have multiple cardiac risk factors including diabetes, hypertension, and hyperlipidemia.  Current Outpatient Prescriptions on File Prior to Visit  Medication Sig Dispense Refill  . amLODipine (NORVASC) 5 MG tablet Take 5 mg by mouth daily.        Marland Kitchen atenolol (TENORMIN) 100 MG tablet Take 100 mg by mouth daily.        . fenofibrate micronized (LOFIBRA) 200 MG capsule Take 200 mg by mouth daily before breakfast.        . glyBURIDE (DIABETA) 5 MG tablet Take 5 mg by mouth 2 (two) times daily with a meal.        . lisinopril (PRINIVIL,ZESTRIL) 10 MG tablet Take 10 mg by mouth daily.        . niacin 500 MG tablet Take 500 mg by mouth daily with breakfast.        . DISCONTD: cilostazol (PLETAL) 100 MG tablet Take 1 tablet (100 mg total) by mouth 2 (two) times daily.  60 tablet  5    No Known Allergies  Past Medical History  Diagnosis Date  . Diabetes mellitus     type 2  . Hypertension   . Peripheral arterial disease   . Hyperlipidemia     Past Surgical History  Procedure Date  . Cholecystectomy 1999  . Cardiac catheterization 01/16/1999    Est. EF of 65% -- Nonobstruction atherosclerotic coronary artery disease -- Normal left ventricular  function      . Cardiac electrophysiology study and ablation 05/21/1999    Normal sinus funtion -- Mildly prolonged interatrial conduction times -- Normal A-V node funtion -- Normal His Purkinje system function -- fNo accessory pathway -- No inducible ventricular tachycardia in the presence of the or in the absence of isoproterenol with programmed stimulation or with burst pacing -- Nikki Dom, M.D. FACC surgan            History  Smoking status  . Former Smoker -- 1.5 packs/day for 20 years  . Types: Cigarettes  . Quit date: 05/27/1979  Smokeless tobacco  . Not on file    History  Alcohol Use  . Yes    Family History  Problem Relation Age of Onset  . Stroke Mother 26    Cerebellar hemorrhage    Review of Systems: The review of systems is positive for some exertional calf discomfort. He describes a burning sensation on the soles of his feet and at times his toes feel like they're on fire. He only has cramping in his calves at night. All other systems were reviewed and are negative.  Physical Exam: BP 124/66  Pulse 72  Ht 5\' 11"  (1.803 m)  Wt 205 lb 3.2 oz (93.078 kg)  BMI 28.62 kg/m2 Is an  obese white male in no acute distress. His HEENT exam is unremarkable. He has no JVD, adenopathy, thyromegaly, or bruits. Lungs are clear. Cardiac exam reveals a regular rate and rhythm without gallop, murmur, or click. Abdomen is obese, soft, nontender. He has no masses or bruits. Extremities are without edema. He has poor pedal pulses. Skin is warm and dry. He is alert and oriented x4. Cranial nerves II through XII are intact. LABORATORY DATA: ECG today demonstrates normal sinus rhythm with a left anterior fascicular block. He has voltage criteria for LVH. He has nonspecific T-wave abnormality.  Assessment / Plan:

## 2011-12-09 NOTE — Interval H&P Note (Signed)
History and Physical Interval Note:  12/09/2011 12:52 PM  [name]  has presented today for surgery, with the diagnosis of chest pain  The various methods of treatment have been discussed with the patient and family. After consideration of risks, benefits and other options for treatment, the patient has consented to  Procedure(s) (LRB): LEFT HEART CATHETERIZATION WITH CORONARY ANGIOGRAM (N/A) as a surgical intervention .  The patients' history has been reviewed, patient examined, no change in status, stable for surgery.  I have reviewed the patients' chart and labs.  Questions were answered to the patient's satisfaction.     Luana Shu, [facility] 12/09/2011 12:53 PM

## 2011-12-10 ENCOUNTER — Ambulatory Visit (HOSPITAL_COMMUNITY): Payer: Medicare Other

## 2011-12-10 ENCOUNTER — Encounter (HOSPITAL_COMMUNITY): Payer: Self-pay | Admitting: Certified Registered"

## 2011-12-10 ENCOUNTER — Ambulatory Visit (HOSPITAL_COMMUNITY): Payer: Medicare Other | Admitting: Certified Registered"

## 2011-12-10 ENCOUNTER — Other Ambulatory Visit: Payer: Self-pay

## 2011-12-10 ENCOUNTER — Encounter (HOSPITAL_COMMUNITY)
Admission: RE | Disposition: A | Discharge status: Home / Self Care | Payer: Self-pay | Source: Ambulatory Visit | Attending: Cardiothoracic Surgery

## 2011-12-10 DIAGNOSIS — I2510 Atherosclerotic heart disease of native coronary artery without angina pectoris: Secondary | ICD-10-CM

## 2011-12-10 HISTORY — PX: CORONARY ARTERY BYPASS GRAFT: SHX141

## 2011-12-10 LAB — POCT I-STAT 3, ART BLOOD GAS (G3+)
Acid-base deficit: 2 mmol/L (ref 0.0–2.0)
Acid-base deficit: 5 mmol/L — ABNORMAL HIGH (ref 0.0–2.0)
Bicarbonate: 22.1 mEq/L (ref 20.0–24.0)
Bicarbonate: 19.9 mEq/L — ABNORMAL LOW (ref 20.0–24.0)
O2 Saturation: 92 %
Patient temperature: 36.5
TCO2: 21 mmol/L (ref 0–100)
pCO2 arterial: 34.9 mmHg — ABNORMAL LOW (ref 35.0–45.0)
pH, Arterial: 7.391 (ref 7.350–7.450)
pH, Arterial: 7.403 (ref 7.350–7.450)
pH, Arterial: 7.41 (ref 7.350–7.450)
pO2, Arterial: 63 mmHg — ABNORMAL LOW (ref 80.0–100.0)

## 2011-12-10 LAB — POCT I-STAT 4, (NA,K, GLUC, HGB,HCT)
Glucose, Bld: 205 mg/dL — ABNORMAL HIGH (ref 70–99)
Glucose, Bld: 134 mg/dL — ABNORMAL HIGH (ref 70–99)
Glucose, Bld: 170 mg/dL — ABNORMAL HIGH (ref 70–99)
Glucose, Bld: 194 mg/dL — ABNORMAL HIGH (ref 70–99)
HCT: 34 % — ABNORMAL LOW (ref 39.0–52.0)
HCT: 26 % — ABNORMAL LOW (ref 39.0–52.0)
HCT: 26 % — ABNORMAL LOW (ref 39.0–52.0)
HCT: 24 % — ABNORMAL LOW (ref 39.0–52.0)
Hemoglobin: 11.6 g/dL — ABNORMAL LOW (ref 13.0–17.0)
Hemoglobin: 8.8 g/dL — ABNORMAL LOW (ref 13.0–17.0)
Hemoglobin: 8.5 g/dL — ABNORMAL LOW (ref 13.0–17.0)
Hemoglobin: 8.2 g/dL — ABNORMAL LOW (ref 13.0–17.0)
Hemoglobin: 10.9 g/dL — ABNORMAL LOW (ref 13.0–17.0)
Potassium: 5.1 mEq/L (ref 3.5–5.1)
Potassium: 4.1 mEq/L (ref 3.5–5.1)
Potassium: 3.9 mEq/L (ref 3.5–5.1)
Potassium: 4.7 mEq/L (ref 3.5–5.1)
Sodium: 141 mEq/L (ref 135–145)
Sodium: 136 mEq/L (ref 135–145)

## 2011-12-10 LAB — PROTIME-INR
INR: 1.45 (ref 0.00–1.49)
Prothrombin Time: 17.9 seconds — ABNORMAL HIGH (ref 11.6–15.2)

## 2011-12-10 LAB — BASIC METABOLIC PANEL
BUN: 10 mg/dL (ref 6–23)
CO2: 21 mEq/L (ref 19–32)
Calcium: 9.8 mg/dL (ref 8.4–10.5)
Chloride: 103 mEq/L (ref 96–112)
Creatinine, Ser: 1.27 mg/dL (ref 0.50–1.35)
GFR calc Af Amer: 63 mL/min — ABNORMAL LOW (ref >90)
GFR calc non Af Amer: 55 mL/min — ABNORMAL LOW (ref >90)
Glucose, Bld: 200 mg/dL — ABNORMAL HIGH (ref 70–99)
Potassium: 3.6 mEq/L (ref 3.5–5.1)
Sodium: 139 mEq/L (ref 135–145)

## 2011-12-10 LAB — PULMONARY FUNCTION TEST

## 2011-12-10 LAB — CBC
HCT: 43 % (ref 39.0–52.0)
Hemoglobin: 15.2 g/dL (ref 13.0–17.0)
MCH: 31.3 pg (ref 26.0–34.0)
MCH: 31.4 pg (ref 26.0–34.0)
MCHC: 35.3 g/dL (ref 30.0–36.0)
MCHC: 35.7 g/dL (ref 30.0–36.0)
MCV: 88.7 fL (ref 78.0–100.0)
Platelets: 127 10*3/uL — ABNORMAL LOW (ref 150–400)
Platelets: 112 10*3/uL — ABNORMAL LOW (ref 150–400)
RBC: 4.85 MIL/uL (ref 4.22–5.81)
RBC: 3.7 MIL/uL — ABNORMAL LOW (ref 4.22–5.81)
RDW: 13.1 % (ref 11.5–15.5)
WBC: 5.2 10*3/uL (ref 4.0–10.5)

## 2011-12-10 LAB — PLATELET COUNT: Platelets: 143 10*3/uL — ABNORMAL LOW (ref 150–400)

## 2011-12-10 LAB — HEMOGLOBIN AND HEMATOCRIT, BLOOD
HCT: 25.6 % — ABNORMAL LOW (ref 39.0–52.0)
Hemoglobin: 9.4 g/dL — ABNORMAL LOW (ref 13.0–17.0)

## 2011-12-10 LAB — HEMOGLOBIN A1C
Hgb A1c MFr Bld: 7 % — ABNORMAL HIGH (ref <5.7)
Mean Plasma Glucose: 154 mg/dL — ABNORMAL HIGH (ref <117)

## 2011-12-10 LAB — PREPARE RBC (CROSSMATCH)

## 2011-12-10 LAB — GLUCOSE, CAPILLARY

## 2011-12-10 SURGERY — CORONARY ARTERY BYPASS GRAFTING (CABG)
Anesthesia: General | Site: Chest | Wound class: Clean

## 2011-12-10 MED ORDER — LACTATED RINGERS IV SOLN
INTRAVENOUS | Status: DC | PRN
  Administered 2011-12-10: 13:00:00 via INTRAVENOUS

## 2011-12-10 MED ORDER — AMIODARONE HCL IN DEXTROSE 360-4.14 MG/200ML-% IV SOLN
INTRAVENOUS | Status: DC | PRN
  Administered 2011-12-10: 15 mg/min via INTRAVENOUS

## 2011-12-10 MED ORDER — ACETAMINOPHEN 650 MG RE SUPP
650.0000 mg | RECTAL | Status: AC
  Administered 2011-12-10: 650 mg via RECTAL

## 2011-12-10 MED ORDER — METOPROLOL TARTRATE 12.5 MG HALF TABLET
12.5000 mg | ORAL_TABLET | Freq: Two times a day (BID) | ORAL | Status: DC
  Administered 2011-12-12 – 2011-12-13 (×4): 12.5 mg via ORAL
  Filled 2011-12-10 (×7): qty 1

## 2011-12-10 MED ORDER — VECURONIUM BROMIDE 10 MG IV SOLR
INTRAVENOUS | Status: DC | PRN
  Administered 2011-12-10: 10 mg via INTRAVENOUS
  Administered 2011-12-10: 5 mg via INTRAVENOUS

## 2011-12-10 MED ORDER — MORPHINE SULFATE 4 MG/ML IJ SOLN
2.0000 mg | INTRAMUSCULAR | Status: DC | PRN
  Administered 2011-12-11: 4 mg via INTRAVENOUS
  Administered 2011-12-11: 3 mg via INTRAVENOUS
  Administered 2011-12-11: 4 mg via INTRAVENOUS
  Administered 2011-12-11: 1 mg via INTRAVENOUS
  Filled 2011-12-10 (×3): qty 1

## 2011-12-10 MED ORDER — METOPROLOL TARTRATE 1 MG/ML IV SOLN: 2.5000 mg | INTRAVENOUS | Status: DC | PRN

## 2011-12-10 MED ORDER — AMIODARONE HCL IN DEXTROSE 360-4.14 MG/200ML-% IV SOLN
60.0000 mg/h | INTRAVENOUS | Status: DC
  Filled 2011-12-10: qty 200

## 2011-12-10 MED ORDER — FAMOTIDINE IN NACL 20-0.9 MG/50ML-% IV SOLN
20.0000 mg | Freq: Two times a day (BID) | INTRAVENOUS | Status: AC
  Administered 2011-12-10: 20 mg via INTRAVENOUS

## 2011-12-10 MED ORDER — FENTANYL CITRATE 0.05 MG/ML IJ SOLN
INTRAMUSCULAR | Status: DC | PRN
  Administered 2011-12-10: 100 ug via INTRAVENOUS
  Administered 2011-12-10 (×2): 125 ug via INTRAVENOUS
  Administered 2011-12-10: 250 ug via INTRAVENOUS
  Administered 2011-12-10: 450 ug via INTRAVENOUS
  Administered 2011-12-10: 150 ug via INTRAVENOUS
  Administered 2011-12-10: 100 ug via INTRAVENOUS
  Administered 2011-12-10 (×2): 150 ug via INTRAVENOUS

## 2011-12-10 MED ORDER — SODIUM CHLORIDE 0.9 % IV SOLN: INTRAVENOUS | Status: DC

## 2011-12-10 MED ORDER — SODIUM CHLORIDE 0.45 % IV SOLN
INTRAVENOUS | Status: DC
  Administered 2011-12-10 (×2): via INTRAVENOUS

## 2011-12-10 MED ORDER — DEXTROSE 5 % IV SOLN
1.5000 g | Freq: Two times a day (BID) | INTRAVENOUS | Status: DC
  Administered 2011-12-10 – 2011-12-11 (×3): 1.5 g via INTRAVENOUS
  Filled 2011-12-10 (×4): qty 1.5

## 2011-12-10 MED ORDER — ACETAMINOPHEN 160 MG/5ML PO SOLN
975.0000 mg | Freq: Four times a day (QID) | ORAL | Status: AC
  Filled 2011-12-10: qty 40.6

## 2011-12-10 MED ORDER — ALBUMIN HUMAN 5 % IV SOLN
250.0000 mL | INTRAVENOUS | Status: AC | PRN
  Administered 2011-12-10 – 2011-12-11 (×4): 250 mL via INTRAVENOUS
  Filled 2011-12-10 (×2): qty 250

## 2011-12-10 MED ORDER — ACETAMINOPHEN 500 MG PO TABS
1000.0000 mg | ORAL_TABLET | Freq: Four times a day (QID) | ORAL | Status: AC
  Administered 2011-12-11 – 2011-12-15 (×18): 1000 mg via ORAL
  Filled 2011-12-10 (×19): qty 2

## 2011-12-10 MED ORDER — AMIODARONE HCL IN DEXTROSE 360-4.14 MG/200ML-% IV SOLN
60.0000 mg/h | INTRAVENOUS | Status: DC
  Administered 2011-12-10: 60 mg/h via INTRAVENOUS
  Administered 2011-12-12: 30 mg/h via INTRAVENOUS
  Filled 2011-12-10 (×12): qty 200

## 2011-12-10 MED ORDER — BISACODYL 5 MG PO TBEC
10.0000 mg | DELAYED_RELEASE_TABLET | Freq: Every day | ORAL | Status: DC
  Administered 2011-12-11 – 2011-12-13 (×3): 10 mg via ORAL
  Filled 2011-12-10 (×3): qty 2

## 2011-12-10 MED ORDER — HEMOSTATIC AGENTS (NO CHARGE) OPTIME
TOPICAL | Status: DC | PRN
  Administered 2011-12-10: 3 via TOPICAL

## 2011-12-10 MED ORDER — SODIUM CHLORIDE 0.9 % IV SOLN
0.1000 ug/kg/h | INTRAVENOUS | Status: DC
  Filled 2011-12-10: qty 2

## 2011-12-10 MED ORDER — DEXTROSE 5 % IV SOLN
10.0000 mg | INTRAVENOUS | Status: DC | PRN
  Administered 2011-12-10: 40 ug via INTRAVENOUS

## 2011-12-10 MED ORDER — INSULIN REGULAR HUMAN 100 UNIT/ML IJ SOLN
INTRAMUSCULAR | Status: DC
  Administered 2011-12-11: 03:00:00 via INTRAVENOUS
  Administered 2011-12-12: 6.2 [IU]/h via INTRAVENOUS
  Administered 2011-12-12: 10.5 [IU]/h via INTRAVENOUS
  Filled 2011-12-10 (×2): qty 1

## 2011-12-10 MED ORDER — SODIUM CHLORIDE 0.9 % IJ SOLN
3.0000 mL | Freq: Two times a day (BID) | INTRAMUSCULAR | Status: DC
  Administered 2011-12-11 (×2): 3 mL via INTRAVENOUS

## 2011-12-10 MED ORDER — HEMOSTATIC AGENTS (NO CHARGE) OPTIME
TOPICAL | Status: DC | PRN
  Administered 2011-12-10: 1 via TOPICAL

## 2011-12-10 MED ORDER — NITROGLYCERIN IN D5W 200-5 MCG/ML-% IV SOLN: 0.0000 ug/min | INTRAVENOUS | Status: DC

## 2011-12-10 MED ORDER — PROPOFOL 10 MG/ML IV EMUL
INTRAVENOUS | Status: DC | PRN
  Administered 2011-12-10: 50 mg via INTRAVENOUS

## 2011-12-10 MED ORDER — LACTATED RINGERS IV SOLN: 500.0000 mL | Freq: Once | INTRAVENOUS | Status: AC | PRN

## 2011-12-10 MED ORDER — LACTATED RINGERS IV SOLN: INTRAVENOUS | Status: DC

## 2011-12-10 MED ORDER — INSULIN REGULAR BOLUS VIA INFUSION
0.0000 [IU] | Freq: Three times a day (TID) | INTRAVENOUS | Status: DC
Start: 2011-12-11 — End: 2011-12-12
  Filled 2011-12-10: qty 10

## 2011-12-10 MED ORDER — METOPROLOL TARTRATE 25 MG/10 ML ORAL SUSPENSION
12.5000 mg | Freq: Two times a day (BID) | ORAL | Status: DC
  Filled 2011-12-10 (×7): qty 5

## 2011-12-10 MED ORDER — OXYCODONE HCL 5 MG PO TABS
5.0000 mg | ORAL_TABLET | ORAL | Status: DC | PRN
  Administered 2011-12-11 (×2): 10 mg via ORAL
  Administered 2011-12-11: 5 mg via ORAL
  Administered 2011-12-12: 10 mg via ORAL
  Filled 2011-12-10: qty 2
  Filled 2011-12-10: qty 1
  Filled 2011-12-10 (×2): qty 2

## 2011-12-10 MED ORDER — MAGNESIUM SULFATE 40 MG/ML IJ SOLN
4.0000 g | Freq: Once | INTRAMUSCULAR | Status: AC
  Administered 2011-12-10: 4 g via INTRAVENOUS
  Filled 2011-12-10: qty 100

## 2011-12-10 MED ORDER — DEXTROSE 5 % IV SOLN
0.7500 g | INTRAVENOUS | Status: DC | PRN
  Administered 2011-12-10: .75 g via INTRAVENOUS

## 2011-12-10 MED ORDER — POTASSIUM CHLORIDE 10 MEQ/50ML IV SOLN: 10.0000 meq | INTRAVENOUS | Status: AC

## 2011-12-10 MED ORDER — HEPARIN SODIUM (PORCINE) 1000 UNIT/ML IJ SOLN
INTRAMUSCULAR | Status: DC | PRN
  Administered 2011-12-10: 20000 [IU] via INTRAVENOUS
  Administered 2011-12-10: 5000 [IU] via INTRAVENOUS

## 2011-12-10 MED ORDER — LACTATED RINGERS IV SOLN
INTRAVENOUS | Status: DC
  Administered 2011-12-10: 13:00:00 via INTRAVENOUS

## 2011-12-10 MED ORDER — ACETAMINOPHEN 160 MG/5ML PO SOLN: 650.0000 mg | ORAL | Status: AC

## 2011-12-10 MED ORDER — SODIUM CHLORIDE 0.9 % IJ SOLN: 3.0000 mL | INTRAMUSCULAR | Status: DC | PRN

## 2011-12-10 MED ORDER — ONDANSETRON HCL 4 MG/2ML IJ SOLN: 4.0000 mg | Freq: Four times a day (QID) | INTRAMUSCULAR | Status: DC | PRN

## 2011-12-10 MED ORDER — SODIUM CHLORIDE 0.9 % IV SOLN
INTRAVENOUS | Status: DC | PRN
  Administered 2011-12-10: 19:00:00 via INTRAVENOUS

## 2011-12-10 MED ORDER — 0.9 % SODIUM CHLORIDE (POUR BTL) OPTIME
TOPICAL | Status: DC | PRN
  Administered 2011-12-10: 6000 mL

## 2011-12-10 MED ORDER — BISACODYL 10 MG RE SUPP: 10.0000 mg | Freq: Every day | RECTAL | Status: DC

## 2011-12-10 MED ORDER — DEXTROSE 5 % IV SOLN
0.0000 ug/min | INTRAVENOUS | Status: DC
  Filled 2011-12-10: qty 2

## 2011-12-10 MED ORDER — PANTOPRAZOLE SODIUM 40 MG PO TBEC
40.0000 mg | DELAYED_RELEASE_TABLET | Freq: Every day | ORAL | Status: DC
  Administered 2011-12-12 – 2011-12-16 (×5): 40 mg via ORAL
  Filled 2011-12-10 (×6): qty 1

## 2011-12-10 MED ORDER — MORPHINE SULFATE 2 MG/ML IJ SOLN
1.0000 mg | INTRAMUSCULAR | Status: AC | PRN
  Administered 2011-12-11: 2 mg via INTRAVENOUS
  Filled 2011-12-10: qty 2

## 2011-12-10 MED ORDER — PROTAMINE SULFATE 10 MG/ML IV SOLN
INTRAVENOUS | Status: DC | PRN
  Administered 2011-12-10: 380 mg via INTRAVENOUS

## 2011-12-10 MED ORDER — SODIUM CHLORIDE 0.9 % IV SOLN
250.0000 mL | INTRAVENOUS | Status: DC
  Administered 2011-12-11: 250 mL via INTRAVENOUS

## 2011-12-10 MED ORDER — VANCOMYCIN HCL 1000 MG IV SOLR
1000.0000 mg | Freq: Once | INTRAVENOUS | Status: AC
  Administered 2011-12-11: 1000 mg via INTRAVENOUS
  Filled 2011-12-10: qty 1000

## 2011-12-10 MED ORDER — MIDAZOLAM HCL 2 MG/2ML IJ SOLN: 2.0000 mg | INTRAMUSCULAR | Status: DC | PRN

## 2011-12-10 MED ORDER — DOCUSATE SODIUM 100 MG PO CAPS
200.0000 mg | ORAL_CAPSULE | Freq: Every day | ORAL | Status: DC
  Administered 2011-12-11 – 2011-12-14 (×4): 200 mg via ORAL
  Filled 2011-12-10 (×5): qty 2

## 2011-12-10 MED ORDER — MIDAZOLAM HCL 5 MG/5ML IJ SOLN
INTRAMUSCULAR | Status: DC | PRN
  Administered 2011-12-10 (×2): 3 mg via INTRAVENOUS
  Administered 2011-12-10: 4 mg via INTRAVENOUS
  Administered 2011-12-10: 1 mg via INTRAVENOUS

## 2011-12-10 MED ORDER — ASPIRIN 81 MG PO CHEW: 324.0000 mg | CHEWABLE_TABLET | Freq: Every day | ORAL | Status: DC

## 2011-12-10 MED ORDER — ASPIRIN EC 325 MG PO TBEC
325.0000 mg | DELAYED_RELEASE_TABLET | Freq: Every day | ORAL | Status: DC
  Administered 2011-12-12 – 2011-12-17 (×6): 325 mg via ORAL
  Filled 2011-12-10 (×7): qty 1

## 2011-12-10 SURGICAL SUPPLY — 136 items
ADAPTER CARDIO PERF ANTE/RETRO (ADAPTER) ×3 IMPLANT
ADPR PRFSN 84XANTGRD RTRGD (ADAPTER) ×1
APL SKNCLS STERI-STRIP NONHPOA (GAUZE/BANDAGES/DRESSINGS) ×3
APPLIER CLIP 9.375 MED OPEN (MISCELLANEOUS)
APPLIER CLIP 9.375 SM OPEN (CLIP)
APR CLP MED 9.3 20 MLT OPN (MISCELLANEOUS)
APR CLP SM 9.3 20 MLT OPN (CLIP)
ATTRACTOMAT 16X20 MAGNETIC DRP (DRAPES) ×3 IMPLANT
BAG DECANTER FOR FLEXI CONT (MISCELLANEOUS) ×3 IMPLANT
BANDAGE ACE 4 STERILE (GAUZE/BANDAGES/DRESSINGS) ×2 IMPLANT
BANDAGE ELASTIC 4 VELCRO ST LF (GAUZE/BANDAGES/DRESSINGS) ×3 IMPLANT
BANDAGE ELASTIC 6 VELCRO ST LF (GAUZE/BANDAGES/DRESSINGS) ×3 IMPLANT
BANDAGE GAUZE ELAST BULKY 4 IN (GAUZE/BANDAGES/DRESSINGS) ×3 IMPLANT
BASKET HEART  (ORDER IN 25'S) (MISCELLANEOUS) ×1
BASKET HEART (ORDER IN 25'S) (MISCELLANEOUS) ×1
BASKET HEART (ORDER IN 25S) (MISCELLANEOUS) ×1 IMPLANT
BENZOIN TINCTURE PRP APPL 2/3 (GAUZE/BANDAGES/DRESSINGS) ×6 IMPLANT
BLADE SAW STERNAL (BLADE) ×3 IMPLANT
BLADE SURG 11 STRL SS (BLADE) ×4 IMPLANT
BLADE SURG 12 STRL SS (BLADE) ×3 IMPLANT
BLADE SURG ROTATE 9660 (MISCELLANEOUS) ×2 IMPLANT
CANISTER SUCTION 2500CC (MISCELLANEOUS) ×3 IMPLANT
CANNULA GUNDRY RCSP 15FR (MISCELLANEOUS) ×3 IMPLANT
CATH CPB KIT VANTRIGT (MISCELLANEOUS) ×3 IMPLANT
CATH ROBINSON RED A/P 18FR (CATHETERS) ×9 IMPLANT
CATH THORACIC 28FR (CATHETERS) IMPLANT
CATH THORACIC 28FR RT ANG (CATHETERS) IMPLANT
CATH THORACIC 36FR (CATHETERS) IMPLANT
CATH THORACIC 36FR RT ANG (CATHETERS) ×6 IMPLANT
CLIP APPLIE 9.375 MED OPEN (MISCELLANEOUS) IMPLANT
CLIP APPLIE 9.375 SM OPEN (CLIP) IMPLANT
CLIP FOGARTY SPRING 6M (CLIP) IMPLANT
CLIP TI MEDIUM 24 (CLIP) IMPLANT
CLIP TI WIDE RED SMALL 24 (CLIP) ×2 IMPLANT
CLOSURE WOUND 1/2 X4 (GAUZE/BANDAGES/DRESSINGS) ×3
CLOTH BEACON ORANGE TIMEOUT ST (SAFETY) ×3 IMPLANT
CONN Y 3/8X3/8X3/8  BEN (MISCELLANEOUS)
CONN Y 3/8X3/8X3/8 BEN (MISCELLANEOUS) IMPLANT
COVER SURGICAL LIGHT HANDLE (MISCELLANEOUS) ×6 IMPLANT
CRADLE DONUT ADULT HEAD (MISCELLANEOUS) ×3 IMPLANT
DRAIN CHANNEL 32F RND 10.7 FF (WOUND CARE) ×3 IMPLANT
DRAPE CARDIOVASCULAR INCISE (DRAPES) ×3
DRAPE SLUSH MACHINE 52X66 (DRAPES) IMPLANT
DRAPE SLUSH/WARMER DISC (DRAPES) ×2 IMPLANT
DRAPE SRG 135X102X78XABS (DRAPES) ×1 IMPLANT
DRSG COVADERM 4X14 (GAUZE/BANDAGES/DRESSINGS) ×3 IMPLANT
ELECT BLADE 4.0 EZ CLEAN MEGAD (MISCELLANEOUS) ×3
ELECT BLADE 6.5 EXT (BLADE) ×3 IMPLANT
ELECT CAUTERY BLADE 6.4 (BLADE) ×3 IMPLANT
ELECT REM PT RETURN 9FT ADLT (ELECTROSURGICAL) ×6
ELECTRODE BLDE 4.0 EZ CLN MEGD (MISCELLANEOUS) ×1 IMPLANT
ELECTRODE REM PT RTRN 9FT ADLT (ELECTROSURGICAL) ×2 IMPLANT
GLOVE BIO SURGEON STRL SZ 6 (GLOVE) IMPLANT
GLOVE BIO SURGEON STRL SZ 6.5 (GLOVE) ×6 IMPLANT
GLOVE BIO SURGEON STRL SZ7 (GLOVE) ×4 IMPLANT
GLOVE BIO SURGEON STRL SZ7.5 (GLOVE) ×8 IMPLANT
GLOVE BIO SURGEONS STRL SZ 6.5 (GLOVE) ×6
GLOVE BIOGEL PI IND STRL 6 (GLOVE) IMPLANT
GLOVE BIOGEL PI IND STRL 6.5 (GLOVE) IMPLANT
GLOVE BIOGEL PI IND STRL 7.0 (GLOVE) IMPLANT
GLOVE BIOGEL PI INDICATOR 6 (GLOVE) ×6
GLOVE BIOGEL PI INDICATOR 6.5 (GLOVE)
GLOVE BIOGEL PI INDICATOR 7.0 (GLOVE) ×20
GLOVE EUDERMIC 7 POWDERFREE (GLOVE) IMPLANT
GLOVE ORTHO TXT STRL SZ7.5 (GLOVE) IMPLANT
GOWN PREVENTION PLUS XLARGE (GOWN DISPOSABLE) ×4 IMPLANT
GOWN STRL NON-REIN LRG LVL3 (GOWN DISPOSABLE) ×18 IMPLANT
HEMOSTAT POWDER SURGIFOAM 1G (HEMOSTASIS) ×9 IMPLANT
HEMOSTAT SURGICEL 2X14 (HEMOSTASIS) ×3 IMPLANT
INSERT FOGARTY 61MM (MISCELLANEOUS) IMPLANT
INSERT FOGARTY XLG (MISCELLANEOUS) IMPLANT
KIT BASIN OR (CUSTOM PROCEDURE TRAY) ×3 IMPLANT
KIT ROOM TURNOVER OR (KITS) ×3 IMPLANT
KIT SUCTION CATH 14FR (SUCTIONS) ×3 IMPLANT
KIT VASOVIEW W/TROCAR VH 2000 (KITS) ×3 IMPLANT
LEAD PACING MYOCARDI (MISCELLANEOUS) ×3 IMPLANT
MARKER GRAFT CORONARY BYPASS (MISCELLANEOUS) ×13 IMPLANT
NS IRRIG 1000ML POUR BTL (IV SOLUTION) ×17 IMPLANT
PACK OPEN HEART (CUSTOM PROCEDURE TRAY) ×3 IMPLANT
PAD ARMBOARD 7.5X6 YLW CONV (MISCELLANEOUS) ×6 IMPLANT
PENCIL BUTTON HOLSTER BLD 10FT (ELECTRODE) ×3 IMPLANT
PUNCH AORTIC ROTATE 4.0MM (MISCELLANEOUS) IMPLANT
PUNCH AORTIC ROTATE 4.5MM 8IN (MISCELLANEOUS) IMPLANT
PUNCH AORTIC ROTATE 5MM 8IN (MISCELLANEOUS) IMPLANT
SET CARDIOPLEGIA MPS 5001102 (MISCELLANEOUS) ×2 IMPLANT
SOLUTION ANTI FOG 6CC (MISCELLANEOUS) ×2 IMPLANT
SPONGE GAUZE 4X4 12PLY (GAUZE/BANDAGES/DRESSINGS) ×8 IMPLANT
SPONGE INTESTINAL PEANUT (DISPOSABLE) IMPLANT
SPONGE LAP 18X18 X RAY DECT (DISPOSABLE) IMPLANT
SPONGE LAP 4X18 X RAY DECT (DISPOSABLE) IMPLANT
STRIP CLOSURE SKIN 1/2X4 (GAUZE/BANDAGES/DRESSINGS) ×3 IMPLANT
SURGIFLO TRUKIT (HEMOSTASIS) ×2 IMPLANT
SUT BONE WAX W31G (SUTURE) ×3 IMPLANT
SUT MNCRL AB 4-0 PS2 18 (SUTURE) IMPLANT
SUT PROLENE 3 0 SH DA (SUTURE) IMPLANT
SUT PROLENE 3 0 SH1 36 (SUTURE) IMPLANT
SUT PROLENE 4 0 RB 1 (SUTURE) ×3
SUT PROLENE 4 0 SH DA (SUTURE) ×3 IMPLANT
SUT PROLENE 4-0 RB1 .5 CRCL 36 (SUTURE) ×1 IMPLANT
SUT PROLENE 5 0 C 1 36 (SUTURE) IMPLANT
SUT PROLENE 6 0 C 1 30 (SUTURE) ×2 IMPLANT
SUT PROLENE 6 0 CC (SUTURE) IMPLANT
SUT PROLENE 7 0 BV 1 (SUTURE) IMPLANT
SUT PROLENE 7 0 BV1 MDA (SUTURE) IMPLANT
SUT PROLENE 7 0 DA (SUTURE) IMPLANT
SUT PROLENE 7.0 RB 3 (SUTURE) ×9 IMPLANT
SUT PROLENE 8 0 BV175 6 (SUTURE) IMPLANT
SUT PROLENE BLUE 7 0 (SUTURE) ×3 IMPLANT
SUT PROLENE POLY MONO (SUTURE) IMPLANT
SUT SILK  1 MH (SUTURE)
SUT SILK 1 MH (SUTURE) IMPLANT
SUT SILK 2 0 SH CR/8 (SUTURE) ×2 IMPLANT
SUT SILK 3 0 SH CR/8 (SUTURE) IMPLANT
SUT STEEL 6MS V (SUTURE) ×6 IMPLANT
SUT STEEL STERNAL CCS#1 18IN (SUTURE) IMPLANT
SUT STEEL SZ 6 DBL 3X14 BALL (SUTURE) ×3 IMPLANT
SUT VIC AB 1 CTX 18 (SUTURE) IMPLANT
SUT VIC AB 1 CTX 36 (SUTURE) ×6
SUT VIC AB 1 CTX36XBRD ANBCTR (SUTURE) ×2 IMPLANT
SUT VIC AB 2-0 CT1 27 (SUTURE) ×3
SUT VIC AB 2-0 CT1 TAPERPNT 27 (SUTURE) IMPLANT
SUT VIC AB 2-0 CTX 27 (SUTURE) IMPLANT
SUT VIC AB 3-0 SH 27 (SUTURE)
SUT VIC AB 3-0 SH 27X BRD (SUTURE) IMPLANT
SUT VIC AB 3-0 X1 27 (SUTURE) IMPLANT
SUT VICRYL 4-0 PS2 18IN ABS (SUTURE) IMPLANT
SUTURE E-PAK OPEN HEART (SUTURE) ×3 IMPLANT
SYR TOOMEY 50ML (SYRINGE) ×4 IMPLANT
SYSTEM SAHARA CHEST DRAIN ATS (WOUND CARE) ×3 IMPLANT
TAPE CLOTH SURG 4X10 WHT LF (GAUZE/BANDAGES/DRESSINGS) ×4 IMPLANT
TOWEL OR 17X24 6PK STRL BLUE (TOWEL DISPOSABLE) ×6 IMPLANT
TOWEL OR 17X26 10 PK STRL BLUE (TOWEL DISPOSABLE) ×8 IMPLANT
TRAY FOLEY IC TEMP SENS 16FR (CATHETERS) ×3 IMPLANT
TUBING INSUFFLATION 10FT LAP (TUBING) ×3 IMPLANT
UNDERPAD 30X30 INCONTINENT (UNDERPADS AND DIAPERS) ×3 IMPLANT
WATER STERILE IRR 1000ML POUR (IV SOLUTION) ×6 IMPLANT

## 2011-12-10 NOTE — Brief Op Note (Signed)
12/09/2011 - 12/10/2011  7:28 PM  PATIENT:  Steve [name].  71 y.o. male  PRE-OPERATIVE DIAGNOSIS:  CAD with 70% left main stenosis POST-OPERATIVE DIAGNOSIS:  CAD with 70% left main stenosis  PROCEDURE:  Procedure(s) (LRB): CORONARY ARTERY BYPASS GRAFTING (CABG) (N/A) x4 (left IMA to LAD, sequential saphenous vein graft to OM1 and distal circumflex, saphenous vein graft to ramus intermediate) SURGEON:  Surgeon(s) and Role:    * Len Childs, MD - Primary  PHYSICIAN ASSISTANT Suzzanne Cloud PA-C:     ANESTHESIA:   general  EBL:  Total I/O In: 800 [I.V.:800] Out: 1285 [Urine:285; Blood:1000]  BLOOD ADMINISTERED: None  DRAINS: One mediastinal tube one left pleural tube   LOCAL MEDICATIONS USED: None  SPECIMEN:  None  DISPOSITION OF SPECIMEN:  N/A  COUNTS:  Correct    DICTATION: . Her eyes  PLAN OF CARE: To ICU in patient  PATIENT DISPOSITION:  ICU - intubated and hemodynamically stable.   Delay start of Pharmacological VTE agent (>24hrs) due to surgical blood loss or risk of bleeding: Yes

## 2011-12-10 NOTE — Progress Notes (Signed)
TELEMETRY: Reviewed telemetry pt in NSR: Filed Vitals:   12/09/11 1859 12/09/11 2047 12/10/11 0609 12/10/11 0641  BP: 149/66 142/79 154/77   Pulse: 71 63 62   Temp:  97.8 F (36.6 C) 97.9 F (36.6 C)   TempSrc:  Oral Oral   Resp:  19 18   Height:      Weight:    88.5 kg (195 lb 1.7 oz)  SpO2:  97% 95%     Intake/Output Summary (Last 24 hours) at 12/10/11 0841 Last data filed at 12/10/11 0831  Gross per 24 hour  Intake 410.05 ml  Output    950 ml  Net -539.95 ml    SUBJECTIVE Feels well. No chest pain overnight   LABS: Basic Metabolic Panel:  Basename 12/10/11 0700 12/07/11 1017  NA 139 137  K 3.6 4.2  CL 103 106  CO2 21 22  GLUCOSE 200* 284*  BUN 10 13  CREATININE 1.27 1.3  CALCIUM 9.8 9.1  MG -- --  PHOS -- --   Liver Function Tests: No results found for this basename: AST:2,ALT:2,ALKPHOS:2,BILITOT:2,PROT:2,ALBUMIN:2 in the last 72 hours No results found for this basename: LIPASE:2,AMYLASE:2 in the last 72 hours CBC:  Basename 12/10/11 0700 12/07/11 1017  WBC 5.2 6.5  NEUTROABS -- 4.7  HGB 15.2 13.1  HCT 43.0 37.9*  MCV 88.7 93.0  PLT 127* 157.0   Cardiac Enzymes: No results found for this basename: CKTOTAL:3,CKMB:3,CKMBINDEX:3,TROPONINI:3 in the last 72 hours BNP: No components found with this basename: POCBNP:3 D-Dimer: No results found for this basename: DDIMER:2 in the last 72 hours Hemoglobin A1C:  Basename 12/09/11 1826  HGBA1C 7.0*   Fasting Lipid Panel: No results found for this basename: CHOL,HDL,LDLCALC,TRIG,CHOLHDL,LDLDIRECT in the last 72 hours Thyroid Function Tests: No results found for this basename: TSH,T4TOTAL,FREET3,T3FREE,THYROIDAB in the last 72 hours Anemia Panel: No results found for this basename: VITAMINB12,FOLATE,FERRITIN,TIBC,IRON,RETICCTPCT in the last 72 hours  Radiology/Studies:  Dg Chest 2 View  12/07/2011  *RADIOLOGY REPORT*  Clinical Data: Pain in the midchest.  Hypertension.  Ex-smoker.  CHEST - 2 VIEW   Comparison: 01/15/2008.01/15/2008.  Findings: Examination is slightly rotated. The cardiac silhouette is normal size and shape. Ectasia and nonaneurysmal calcification of the thoracic aorta are seen.  Mediastinal and hilar contours appear stable.  No pulmonary edema, pneumonia, or pleural effusion is evident. On the PA image nipple density is again seen over the left base. Bones appear average for age.  IMPRESSION: No acute or active cardiopulmonary or pleural abnormality is evident.  Original Report Authenticated By: Delane Ginger, M.D.    PHYSICAL EXAM General: Well developed, well nourished, in no acute distress. Head: Normocephalic, atraumatic, sclera non-icteric, no xanthomas, nares are without discharge. edentulous Neck: Negative for carotid bruits. JVD not elevated. Lungs: Clear bilaterally to auscultation without wheezes, rales, or rhonchi. Breathing is unlabored. Heart: RRR S1 S2 without murmurs, rubs, or gallops.  Abdomen: Soft, non-tender, non-distended with normoactive bowel sounds. No hepatomegaly. No rebound/guarding. No obvious abdominal masses. Msk:  Strength and tone appears normal for age. Extremities: No clubbing, cyanosis or edema.  Distal pedal pulses nonpalpable. Left groin without hematoma Neuro: Alert and oriented X 3. Moves all extremities spontaneously. Psych:  Responds to questions appropriately with a normal affect.  ASSESSMENT AND PLAN: 1. Unstable angina. Severe 3 vessel CAD with critical ostial LAD stenosis. For CABG today. 2. DM 3. HL 4. HTN 5. PAD chronic- note arteriogram by Dr. Scot Dock in 2009.   Principal Problem:  *Unstable angina Active  Problems:  Hypertension  Peripheral arterial disease  Hyperlipidemia  Diabetes mellitus, type 2  CAD (coronary artery disease)    Signed, Ahni Bradwell Martin[place] MD 12/10/2011 8:41 AM

## 2011-12-10 NOTE — Progress Notes (Signed)
Patient has a procedure today and is NPO. Notified PA Barrett about morning meds and was told that it is okay to hold all meds due to procedure. All morning meds held per PA orders.

## 2011-12-10 NOTE — Transfer of Care (Signed)
Immediate Anesthesia Transfer of Care Note  Patient: Steve [name].  Procedure(s) Performed: Procedure(s) (LRB): CORONARY ARTERY BYPASS GRAFTING (CABG) (N/A)  Patient Location: SICU  Anesthesia Type: General  Level of Consciousness: sedated and unresponsive  Airway & Oxygen Therapy: Patient remains intubated per anesthesia plan  Post-op Assessment: Report given to PACU RN and Post -op Vital signs reviewed and stable  Post vital signs: Reviewed and stable  Complications: No apparent anesthesia complications

## 2011-12-10 NOTE — Anesthesia Postprocedure Evaluation (Signed)
  Anesthesia Post-op Note  Patient: Steve [name].  Procedure(s) Performed: Procedure(s) (LRB): CORONARY ARTERY BYPASS GRAFTING (CABG) (N/A)  Patient Location: PACU and SICU  Anesthesia Type: General  Level of Consciousness: Patient remains intubated per anesthesia plan  Airway and Oxygen Therapy: Patient remains intubated per anesthesia plan and Patient placed on Ventilator (see vital sign flow sheet for setting)  Post-op Pain: none  Post-op Assessment: Post-op Vital signs reviewed, Patient's Cardiovascular Status Stable, Respiratory Function Stable, Patent Airway and Pain level controlled  Post-op Vital Signs: stable  Complications: No apparent anesthesia complications

## 2011-12-10 NOTE — Preoperative (Signed)
Beta Blockers   Reason not to administer Beta Blockers:Not Applicable 

## 2011-12-10 NOTE — Anesthesia Preprocedure Evaluation (Addendum)
Anesthesia Evaluation  Patient identified by MRN, date of birth, ID band Patient awake    Reviewed: Allergy & Precautions, H&P , NPO status , Patient's Chart, lab work & pertinent test results, reviewed documented beta blocker date and time   Airway Mallampati: II TM Distance: <3 FB Neck ROM: Full  Mouth opening: Limited Mouth Opening  Dental  (+) Edentulous Upper and Edentulous Lower   Pulmonary  clear to auscultation  Pulmonary exam normal       Cardiovascular hypertension, Pt. on medications + angina with exertion + CAD Irregular Normal    Neuro/Psych    GI/Hepatic   Endo/Other  Diabetes mellitus-, Well Controlled, Type 2, Oral Hypoglycemic Agents  Renal/GU      Musculoskeletal   Abdominal (+)  Abdomen: soft. Bowel sounds: normal.  Peds  Hematology   Anesthesia Other Findings   Reproductive/Obstetrics                       Anesthesia Physical Anesthesia Plan  ASA: IV  Anesthesia Plan: General   Post-op Pain Management:    Induction: Intravenous  Airway Management Planned: Oral ETT  Additional Equipment:   Intra-op Plan:   Post-operative Plan: Post-operative intubation/ventilation  Informed Consent: I have reviewed the patients History and Physical, chart, labs and discussed the procedure including the risks, benefits and alternatives for the proposed anesthesia with the patient or authorized representative who has indicated his/her understanding and acceptance.   Dental advisory given  Plan Discussed with: CRNA, Anesthesiologist and Surgeon  Anesthesia Plan Comments:         Anesthesia Quick Evaluation

## 2011-12-10 NOTE — Progress Notes (Signed)
Patient ID: Steve Mask., male   DOB: Mar 13, 1941, 71 y.o.   MRN: [id number]    The patient was examined and preop studies reviewed. There has been no change from the prior exam and the patient is ready for surgery. Plan multivessel CABG for L main 3vessel CAD.

## 2011-12-10 NOTE — Brief Op Note (Signed)
12/09/2011 - 12/10/2011  6:03 PM  PATIENT:  Steve [name].  71 y.o. male  PRE-OPERATIVE DIAGNOSIS:  CAD  POST-OPERATIVE DIAGNOSIS:  CAD  PROCEDURE:  Procedure(s): CORONARY ARTERY BYPASS GRAFTING (CABG) x 4 (LIMA-LAD, SVG-Ramus, SVG-OM1-OM2), EVH Left leg  SURGEON:  Surgeon(s): Tharon Aquas Trigt III, MD  ASSISTANT: Suzzanne Cloud, PA-C  ANESTHESIA:   general  PATIENT CONDITION:  ICU - intubated and hemodynamically stable.  PRE-OPERATIVE WEIGHT: 89 kg

## 2011-12-11 ENCOUNTER — Inpatient Hospital Stay (HOSPITAL_COMMUNITY): Payer: Medicare Other

## 2011-12-11 LAB — PREPARE PLATELET PHERESIS: Unit division: 0

## 2011-12-11 LAB — CBC
HCT: 31.3 % — ABNORMAL LOW (ref 39.0–52.0)
Hemoglobin: 10 g/dL — ABNORMAL LOW (ref 13.0–17.0)
Hemoglobin: 10.8 g/dL — ABNORMAL LOW (ref 13.0–17.0)
MCH: 31.6 pg (ref 26.0–34.0)
MCHC: 35.7 g/dL (ref 30.0–36.0)
MCHC: 34.5 g/dL (ref 30.0–36.0)
MCV: 88.6 fL (ref 78.0–100.0)
RBC: 3.16 MIL/uL — ABNORMAL LOW (ref 4.22–5.81)
RBC: 3.46 MIL/uL — ABNORMAL LOW (ref 4.22–5.81)
WBC: 16.1 10*3/uL — ABNORMAL HIGH (ref 4.0–10.5)

## 2011-12-11 LAB — BASIC METABOLIC PANEL
CO2: 19 mEq/L (ref 19–32)
Calcium: 7.8 mg/dL — ABNORMAL LOW (ref 8.4–10.5)
Creatinine, Ser: 1.07 mg/dL (ref 0.50–1.35)
GFR calc non Af Amer: 67 mL/min — ABNORMAL LOW (ref >90)
Glucose, Bld: 125 mg/dL — ABNORMAL HIGH (ref 70–99)
Sodium: 142 mEq/L (ref 135–145)

## 2011-12-11 LAB — POCT I-STAT 3, ART BLOOD GAS (G3+)
Bicarbonate: 19.1 mEq/L — ABNORMAL LOW (ref 20.0–24.0)
O2 Saturation: 95 %
Patient temperature: 37.3
pCO2 arterial: 32 mmHg — ABNORMAL LOW (ref 35.0–45.0)
pCO2 arterial: 36.2 mmHg (ref 35.0–45.0)
pH, Arterial: 7.33 — ABNORMAL LOW (ref 7.350–7.450)
pH, Arterial: 7.337 — ABNORMAL LOW (ref 7.350–7.450)

## 2011-12-11 LAB — POCT I-STAT, CHEM 8
Creatinine, Ser: 1.4 mg/dL — ABNORMAL HIGH (ref 0.50–1.35)
Glucose, Bld: 199 mg/dL — ABNORMAL HIGH (ref 70–99)
Hemoglobin: 9.9 g/dL — ABNORMAL LOW (ref 13.0–17.0)
Potassium: 3.8 mEq/L (ref 3.5–5.1)

## 2011-12-11 LAB — GLUCOSE, CAPILLARY
Glucose-Capillary: 101 mg/dL — ABNORMAL HIGH (ref 70–99)
Glucose-Capillary: 103 mg/dL — ABNORMAL HIGH (ref 70–99)
Glucose-Capillary: 105 mg/dL — ABNORMAL HIGH (ref 70–99)
Glucose-Capillary: 121 mg/dL — ABNORMAL HIGH (ref 70–99)
Glucose-Capillary: 132 mg/dL — ABNORMAL HIGH (ref 70–99)
Glucose-Capillary: 140 mg/dL — ABNORMAL HIGH (ref 70–99)
Glucose-Capillary: 158 mg/dL — ABNORMAL HIGH (ref 70–99)

## 2011-12-11 LAB — MAGNESIUM: Magnesium: 2.3 mg/dL (ref 1.5–2.5)

## 2011-12-11 LAB — CREATININE, SERUM
Creatinine, Ser: 1.21 mg/dL (ref 0.50–1.35)
GFR calc Af Amer: 67 mL/min — ABNORMAL LOW (ref >90)
GFR calc non Af Amer: 58 mL/min — ABNORMAL LOW (ref >90)

## 2011-12-11 MED ORDER — INSULIN GLARGINE 100 UNIT/ML ~~LOC~~ SOLN
15.0000 [IU] | Freq: Two times a day (BID) | SUBCUTANEOUS | Status: DC
  Administered 2011-12-11 (×2): 15 [IU] via SUBCUTANEOUS
  Filled 2011-12-11: qty 3

## 2011-12-11 MED ORDER — FUROSEMIDE 10 MG/ML IJ SOLN
20.0000 mg | Freq: Four times a day (QID) | INTRAMUSCULAR | Status: AC
  Administered 2011-12-11 (×3): 20 mg via INTRAVENOUS
  Filled 2011-12-11 (×3): qty 2

## 2011-12-11 MED ORDER — INSULIN ASPART 100 UNIT/ML ~~LOC~~ SOLN
0.0000 [IU] | SUBCUTANEOUS | Status: DC
  Administered 2011-12-11: 2 [IU] via SUBCUTANEOUS
  Administered 2011-12-11 – 2011-12-12 (×2): 4 [IU] via SUBCUTANEOUS
  Administered 2011-12-12 – 2011-12-14 (×8): 2 [IU] via SUBCUTANEOUS
  Filled 2011-12-11: qty 3

## 2011-12-11 NOTE — Progress Notes (Signed)
   CARDIOTHORACIC SURGERY PROGRESS NOTE   R1 Day Post-Op Procedure(s) (LRB): CORONARY ARTERY BYPASS GRAFTING (CABG) (N/A)  Subjective: Looks good. Mild soreness in chest.  Breathing comfortably.  Objective: Vital signs: BP Readings from Last 1 Encounters:  12/11/11 125/58   Pulse Readings from Last 1 Encounters:  12/11/11 72   Resp Readings from Last 1 Encounters:  12/11/11 26   Temp Readings from Last 1 Encounters:  12/11/11 100 F (37.8 C)     Hemodynamics: PAP: (16-35)/(9-20) 34/16 mmHg CO:  [4 L/min-6.5 L/min] 6.5 L/min CI:  [1.9 L/min/m2-3.1 L/min/m2] 3.1 L/min/m2  Physical Exam:  Rhythm:   sinus  Breath sounds: clear  Heart sounds:  RRR  Incisions:  Dressings dry  Abdomen:  soft  Extremities:  warm   Intake/Output from previous day: 02/15 0701 - 02/16 0700 In: 6075.1 [P.O.:270; I.V.:3955.1; Blood:500; NG/GT:30; IV [id number] Out: 3905 [Urine:2485; Blood:1000; Chest Tube:420] Intake/Output this shift: Total I/O In: 100 [P.O.:60; I.V.:40] Out: 80 [Urine:60; Chest Tube:20]  Lab Results:  Basename 12/11/11 0430 12/10/11 2005 12/10/11 2000  WBC 11.6* -- 12.5*  HGB 10.0* 10.9* --  HCT 28.0* 32.0* --  PLT 115* -- 112*   BMET:  Basename 12/11/11 0430 12/10/11 2005 12/10/11 0700  NA 142 137 --  K 4.0 4.3 --  CL 111 -- 103  CO2 19 -- 21  GLUCOSE 125* 182* --  BUN 9 -- 10  CREATININE 1.07 -- 1.27  CALCIUM 7.8* -- 9.8    CBG (last 3)   Basename 12/11/11 1025 12/11/11 0920 12/11/11 0817  GLUCAP 98 105* 101*   ABG    Component Value Date/Time   PHART 7.330* 12/11/2011 0215   HCO3 19.1* 12/11/2011 0215   TCO2 20 12/11/2011 0215   ACIDBASEDEF 6.0* 12/11/2011 0215   O2SAT 90.0 12/11/2011 0215   CXR: Stable, clear  Assessment/Plan: S/P Procedure(s) (LRB): CORONARY ARTERY BYPASS GRAFTING (CABG) (N/A)  Doing well POD1 Expected post op acute blood loss anemia, mild Expected post op volume excess, mild DMII, still on insulin  drip   Mobilize  D/C tubes and lines  Diuresis  lantus insulin  Steve [name] 12/11/2011 11:22 AM

## 2011-12-11 NOTE — Progress Notes (Signed)
TCTS BRIEF SICU PROGRESS NOTE  1 Day Post-Op  S/P Procedure(s) (LRB): CORONARY ARTERY BYPASS GRAFTING (CABG) (N/A)   Stable day NSR BP stable UOP adequate  Plan: Continue current plan  Dortha Neighbors H 12/11/2011 6:02 PM

## 2011-12-11 NOTE — Progress Notes (Signed)
Pt sleeping, sats 87-89% on 6LNC, placed on venti mask 40%, sats 92-94%.

## 2011-12-11 NOTE — Procedures (Signed)
Extubation Procedure Note  Patient Details:   Name: Steve [name]. DOB: [birth year] MRN: [id number]   Airway Documentation:  Airway 8 mm (Active)  Secured at (cm) 23 cm 12/10/2011 11:36 PM  Measured From Lips 12/10/2011 11:36 PM  Secured By Rana Snare Tape 12/10/2011 11:36 PM    Evaluation  O2 sats: stable throughout Complications: No apparent complications Patient did tolerate procedure well. Bilateral Breath Sounds: Clear;Diminished   Yes Prior to extubation NIF -28, VC 552ml. Pt  Extubated and placed on 3l Wallace, RT instructed pt on use of IS.  Sigurd Sos 12/11/2011, 1:06 AM

## 2011-12-12 ENCOUNTER — Inpatient Hospital Stay (HOSPITAL_COMMUNITY): Payer: Medicare Other

## 2011-12-12 DIAGNOSIS — IMO0001 Reserved for inherently not codable concepts without codable children: Secondary | ICD-10-CM

## 2011-12-12 DIAGNOSIS — E1165 Type 2 diabetes mellitus with hyperglycemia: Secondary | ICD-10-CM

## 2011-12-12 LAB — BASIC METABOLIC PANEL
BUN: 12 mg/dL (ref 6–23)
Chloride: 103 mEq/L (ref 96–112)
GFR calc Af Amer: 66 mL/min — ABNORMAL LOW (ref >90)
Glucose, Bld: 193 mg/dL — ABNORMAL HIGH (ref 70–99)
Potassium: 3.9 mEq/L (ref 3.5–5.1)

## 2011-12-12 LAB — GLUCOSE, CAPILLARY
Glucose-Capillary: 109 mg/dL — ABNORMAL HIGH (ref 70–99)
Glucose-Capillary: 100 mg/dL — ABNORMAL HIGH (ref 70–99)
Glucose-Capillary: 100 mg/dL — ABNORMAL HIGH (ref 70–99)
Glucose-Capillary: 191 mg/dL — ABNORMAL HIGH (ref 70–99)
Glucose-Capillary: 137 mg/dL — ABNORMAL HIGH (ref 70–99)

## 2011-12-12 LAB — CBC
HCT: 28.2 % — ABNORMAL LOW (ref 39.0–52.0)
Hemoglobin: 9.7 g/dL — ABNORMAL LOW (ref 13.0–17.0)
MCHC: 34.4 g/dL (ref 30.0–36.0)

## 2011-12-12 MED ORDER — SODIUM CHLORIDE 0.9 % IJ SOLN
3.0000 mL | Freq: Two times a day (BID) | INTRAMUSCULAR | Status: DC
  Administered 2011-12-12 – 2011-12-16 (×10): 3 mL via INTRAVENOUS

## 2011-12-12 MED ORDER — POTASSIUM CHLORIDE CRYS ER 20 MEQ PO TBCR
20.0000 meq | EXTENDED_RELEASE_TABLET | Freq: Every day | ORAL | Status: DC
  Administered 2011-12-12 – 2011-12-14 (×3): 20 meq via ORAL
  Filled 2011-12-12 (×3): qty 1

## 2011-12-12 MED ORDER — MAGNESIUM HYDROXIDE 400 MG/5ML PO SUSP: 30.0000 mL | Freq: Every day | ORAL | Status: DC | PRN

## 2011-12-12 MED ORDER — ALBUTEROL SULFATE (5 MG/ML) 0.5% IN NEBU
2.5000 mg | INHALATION_SOLUTION | Freq: Four times a day (QID) | RESPIRATORY_TRACT | Status: DC
  Administered 2011-12-12 – 2011-12-15 (×14): 2.5 mg via RESPIRATORY_TRACT
  Filled 2011-12-12 (×14): qty 0.5

## 2011-12-12 MED ORDER — INSULIN GLARGINE 100 UNIT/ML ~~LOC~~ SOLN
30.0000 [IU] | Freq: Two times a day (BID) | SUBCUTANEOUS | Status: DC
  Administered 2011-12-12 – 2011-12-13 (×3): 30 [IU] via SUBCUTANEOUS

## 2011-12-12 MED ORDER — ALBUTEROL SULFATE (5 MG/ML) 0.5% IN NEBU
INHALATION_SOLUTION | RESPIRATORY_TRACT | Status: AC
  Administered 2011-12-12: 2.5 mg via RESPIRATORY_TRACT
  Filled 2011-12-12: qty 0.5

## 2011-12-12 MED ORDER — SODIUM CHLORIDE 0.9 % IV SOLN: 250.0000 mL | INTRAVENOUS | Status: DC | PRN

## 2011-12-12 MED ORDER — OXYCODONE HCL 5 MG PO TABS
5.0000 mg | ORAL_TABLET | ORAL | Status: DC | PRN
  Administered 2011-12-12: 10 mg via ORAL
  Administered 2011-12-12 – 2011-12-16 (×2): 5 mg via ORAL
  Filled 2011-12-12: qty 2
  Filled 2011-12-12 (×3): qty 1

## 2011-12-12 MED ORDER — FUROSEMIDE 40 MG PO TABS
40.0000 mg | ORAL_TABLET | Freq: Every day | ORAL | Status: DC
Start: 2011-12-12 — End: 2011-12-13
  Administered 2011-12-12: 40 mg via ORAL
  Filled 2011-12-12 (×2): qty 1

## 2011-12-12 MED ORDER — SODIUM CHLORIDE 0.9 % IJ SOLN: 3.0000 mL | INTRAMUSCULAR | Status: DC | PRN

## 2011-12-12 MED ORDER — MOVING RIGHT ALONG BOOK
Freq: Once | Status: AC
  Administered 2011-12-12: 12:00:00
  Filled 2011-12-12: qty 1

## 2011-12-12 NOTE — Progress Notes (Signed)
TCTS BRIEF SICU PROGRESS NOTE  2 Days Post-Op  S/P Procedure(s) (LRB): CORONARY ARTERY BYPASS GRAFTING (CABG) (N/A)   Stable day Waiting for bed on step down for transfer  Plan: Continue current plan  Rexene Alberts 12/12/2011 6:26 PM

## 2011-12-12 NOTE — Progress Notes (Signed)
   CARDIOTHORACIC SURGERY PROGRESS NOTE   R2 Days Post-Op Procedure(s) (LRB): CORONARY ARTERY BYPASS GRAFTING (CABG) (N/A)  Subjective: Feels weak, poor appetite, but otherwise no specific complaints  Objective: Vital signs: BP Readings from Last 1 Encounters:  12/12/11 117/53   Pulse Readings from Last 1 Encounters:  12/12/11 66   Resp Readings from Last 1 Encounters:  12/12/11 21   Temp Readings from Last 1 Encounters:  12/12/11 98.1 F (36.7 C) Oral    Hemodynamics: PAP: (33-37)/(16-18) 37/18 mmHg  Physical Exam:  Rhythm:   Sinus with PVC's, occasional bigeminy  Breath sounds: clear  Heart sounds:  RRR  Incisions:  Clean and dry  Abdomen:  Soft, non distended, non tender  Extremities:  warm   Intake/Output from previous day: 02/16 0701 - 02/17 0700 In: 1446.3 [P.O.:360; I.V.:980.3; IV Piggyback:106] Out: 1655 [Urine:1615; Chest Tube:40] Intake/Output this shift: Total I/O In: 81.5 [I.V.:81.5] Out: 50 [Urine:50]  Lab Results:  Basename 12/12/11 0400 12/11/11 1843 12/11/11 1630  WBC 13.4* -- 16.1*  HGB 9.7* 9.9* --  HCT 28.2* 29.0* --  PLT 110* -- 124*   BMET:  Basename 12/12/11 0400 12/11/11 1843 12/11/11 0430  NA 137 139 --  K 3.9 3.8 --  CL 103 105 --  CO2 23 -- 19  GLUCOSE 193* 199* --  BUN 12 8 --  CREATININE 1.23 1.40* --  CALCIUM 8.2* -- 7.8*    CBG (last 3)   Basename 12/12/11 0957 12/12/11 0854 12/12/11 0749  GLUCAP 92 102* 119*   ABG    Component Value Date/Time   PHART 7.330* 12/11/2011 0215   HCO3 19.1* 12/11/2011 0215   TCO2 21 12/11/2011 1843   ACIDBASEDEF 6.0* 12/11/2011 0215   O2SAT 90.0 12/11/2011 0215   CXR: Mild LLL atelectasis +/- small left effusion  Assessment/Plan: S/P Procedure(s) (LRB): CORONARY ARTERY BYPASS GRAFTING (CABG) (N/A)  Doing well POD2 Expected post op acute blood loss anemia, mild, stable Expected post op volume excess, mild, diuresing DMII now back on insulin drip No  arrhythmias   Mobilize  Diuresis  D/C amiodarone  Increase lantus insulin and wean insulin drip as tolerated  Transfer step down  Burgundy Matuszak H 12/12/2011 10:33 AM

## 2011-12-12 NOTE — Progress Notes (Signed)
Nursing  Pt restarted on insulin gtt at 0530 per previous orders and protocol.  CBG's climbing: 160, 189, 190 and 191 on labs this morning.  Noted pt is on Lantus 15 units BID and have received two doses.  Osa Craver RN

## 2011-12-13 ENCOUNTER — Encounter (HOSPITAL_COMMUNITY): Payer: Self-pay | Admitting: Cardiothoracic Surgery

## 2011-12-13 ENCOUNTER — Inpatient Hospital Stay (HOSPITAL_COMMUNITY): Payer: Medicare Other

## 2011-12-13 DIAGNOSIS — I200 Unstable angina: Secondary | ICD-10-CM

## 2011-12-13 DIAGNOSIS — E1165 Type 2 diabetes mellitus with hyperglycemia: Secondary | ICD-10-CM

## 2011-12-13 DIAGNOSIS — IMO0001 Reserved for inherently not codable concepts without codable children: Secondary | ICD-10-CM

## 2011-12-13 LAB — TYPE AND SCREEN
ABO/RH(D): O POS
Antibody Screen: NEGATIVE
Unit division: 0
Unit division: 0
Unit division: 0
Unit division: 0
Unit division: 0
Unit division: 0
Unit division: 0

## 2011-12-13 LAB — CBC
HCT: 28.1 % — ABNORMAL LOW (ref 39.0–52.0)
Hemoglobin: 9.6 g/dL — ABNORMAL LOW (ref 13.0–17.0)
RBC: 3.05 MIL/uL — ABNORMAL LOW (ref 4.22–5.81)
RDW: 13.8 % (ref 11.5–15.5)
WBC: 9.6 10*3/uL (ref 4.0–10.5)

## 2011-12-13 LAB — BASIC METABOLIC PANEL
BUN: 19 mg/dL (ref 6–23)
CO2: 23 mEq/L (ref 19–32)
Chloride: 105 mEq/L (ref 96–112)
GFR calc Af Amer: 77 mL/min — ABNORMAL LOW (ref >90)
Glucose, Bld: 128 mg/dL — ABNORMAL HIGH (ref 70–99)
Potassium: 3.6 mEq/L (ref 3.5–5.1)

## 2011-12-13 LAB — GLUCOSE, CAPILLARY

## 2011-12-13 MED ORDER — GLYBURIDE 5 MG PO TABS
5.0000 mg | ORAL_TABLET | Freq: Two times a day (BID) | ORAL | Status: DC
  Administered 2011-12-14 – 2011-12-17 (×7): 5 mg via ORAL
  Filled 2011-12-13 (×10): qty 1

## 2011-12-13 MED ORDER — POTASSIUM CHLORIDE CRYS ER 20 MEQ PO TBCR
40.0000 meq | EXTENDED_RELEASE_TABLET | Freq: Once | ORAL | Status: AC
  Administered 2011-12-13: 40 meq via ORAL
  Filled 2011-12-13: qty 2

## 2011-12-13 MED ORDER — METFORMIN HCL 500 MG PO TABS
500.0000 mg | ORAL_TABLET | Freq: Two times a day (BID) | ORAL | Status: DC
  Administered 2011-12-14 – 2011-12-17 (×7): 500 mg via ORAL
  Filled 2011-12-13 (×10): qty 1

## 2011-12-13 MED ORDER — ENSURE CLINICAL ST REVIGOR PO LIQD
237.0000 mL | Freq: Three times a day (TID) | ORAL | Status: DC
  Administered 2011-12-15 – 2011-12-16 (×3): 237 mL via ORAL
  Filled 2011-12-13 (×6): qty 237

## 2011-12-13 MED ORDER — LACTULOSE 10 GM/15ML PO SOLN
20.0000 g | Freq: Once | ORAL | Status: AC
  Administered 2011-12-13: 20 g via ORAL
  Filled 2011-12-13 (×2): qty 30

## 2011-12-13 MED ORDER — ENOXAPARIN SODIUM 40 MG/0.4ML ~~LOC~~ SOLN
40.0000 mg | SUBCUTANEOUS | Status: DC
  Administered 2011-12-13 – 2011-12-17 (×4): 40 mg via SUBCUTANEOUS
  Filled 2011-12-13 (×5): qty 0.4

## 2011-12-13 MED ORDER — FUROSEMIDE 10 MG/ML IJ SOLN
40.0000 mg | Freq: Every day | INTRAMUSCULAR | Status: DC
  Administered 2011-12-13 – 2011-12-14 (×2): 40 mg via INTRAVENOUS
  Filled 2011-12-13 (×3): qty 4

## 2011-12-13 MED FILL — Nitroglycerin IV Soln 5 MG/ML: INTRAVENOUS | Qty: 10 | Status: AC

## 2011-12-13 MED FILL — Heparin Sodium (Porcine) Inj 1000 Unit/ML: INTRAMUSCULAR | Qty: 10 | Status: AC

## 2011-12-13 MED FILL — Verapamil HCl IV Soln 2.5 MG/ML: INTRAVENOUS | Qty: 4 | Status: AC

## 2011-12-13 MED FILL — Potassium Chloride Inj 2 mEq/ML: INTRAVENOUS | Qty: 40 | Status: AC

## 2011-12-13 MED FILL — Magnesium Sulfate Inj 50%: INTRAMUSCULAR | Qty: 10 | Status: AC

## 2011-12-13 MED FILL — Lactated Ringer's Solution: INTRAVENOUS | Qty: 500 | Status: AC

## 2011-12-13 NOTE — Progress Notes (Signed)
UR Completed.  Vergie Living [id number] 12/13/2011

## 2011-12-13 NOTE — Progress Notes (Signed)
CARDIAC REHAB PHASE I   PRE:  Rate/Rhythm: 86 SR PVC  BP:  Supine: 157/97  Sitting:   Standing:    SaO2: 93 6L  MODE:  Ambulation: 350 ft   POST:  Rate/Rhythem: 95 SR PVC  BP:  Supine:   Sitting: 133/64  Standing:    SaO2: 87- 89 6L 0900-1000 Assisted X 1 used walker and O2 6L to ambulate. Gait steady with walker is DOE. Took two standing rest stops related to SOB.To recliner after walk with call light in reach.   Deon Pilling

## 2011-12-13 NOTE — Progress Notes (Signed)
   CARE MANAGEMENT NOTE 12/13/2011  Patient:  Steve [name], Steve [name]   Account Number:  [id number]  Date Initiated:  12/13/2011  Documentation initiated by:  Jeanluc Wegman  Subjective/Objective Assessment:   PT S/P CABG ON 12/10/11.  PTA, PT INDEPENDENT, LIVES WITH SPOUSE.     Action/Plan:   MET WITH PT TO DISCUSS DISCHARGE NEEDS.   Anticipated DC Date:  12/15/2011   Anticipated DC Plan:  [place]  CM consult      Choice offered to / List presented to:             Status of service:  In process, will continue to follow Medicare Important Message given?   (If response is "NO", the following Medicare IM given date fields will be blank) Date Medicare IM given:   Date Additional Medicare IM given:    Discharge Disposition:    Per UR Regulation:    Comments:  12/13/11 Bella Brummet,RN,BSN PT STATES WIFE TO PROVIDE 24HR CARE AT DC; SON WILL ASSIST WITH CARE AS WELL.  REQUESTS RW FOR HOME.  REFERRAL TO [facility] FOR DME NEEDS.  WILL CONT TO FOLLOW FOR HOME NEEDS AS PT PROGRESSES. Phone [phone]

## 2011-12-13 NOTE — Progress Notes (Signed)
3 Days Post-Op Procedure(s) (LRB): CORONARY ARTERY BYPASS GRAFTING (CABG) (N/A)  Subjective: Patient ate breakfast but not much appetite for lunch. Does have occasional nausea but no emesis.  Objective: Vital signs in last 24 hours: Patient Vitals for the past 24 hrs:  BP Temp Temp src Pulse Resp SpO2 Weight  12/13/11 0819 - - - - - 91 % -  12/13/11 0407 150/77 mmHg 98.5 F (36.9 C) Oral 70  18  92 % 204 lb (92.534 kg)  12/12/11 2038 151/65 mmHg 97.8 F (36.6 C) Oral 82  17  94 % 207 lb 4.8 oz (94.031 kg)  12/12/11 2000 143/52 mmHg - - 71  18  92 % -  12/12/11 1956 - 97.7 F (36.5 C) Oral - - - -  12/12/11 1900 141/63 mmHg - - 65  14  92 % -  12/12/11 1800 - - - 70  17  92 % -  12/12/11 1700 124/58 mmHg - - - 16  92 % -  12/12/11 1600 116/50 mmHg - - 62  16  91 % -  12/12/11 1543 - 97.9 F (36.6 C) Oral - - - -  12/12/11 1500 - - - 69  17  92 % -  12/12/11 1400 120/52 mmHg - - 63  16  91 % -  12/12/11 1300 114/65 mmHg - - 67  20  92 % -  12/12/11 1225 127/55 mmHg 98.8 F (37.1 C) Oral 72  20  90 % -  12/12/11 1200 127/55 mmHg - - 68  23  90 % -   Pre op weight 89  kg Current Weight  12/13/11 204 lb (92.534 kg)       Intake/Output from previous day: 02/17 0701 - 02/18 0700 In: 702.6 [P.O.:600; I.V.:102.6] Out: 630 [Urine:630]   Physical Exam:  Cardiovascular: RRR, no murmurs, gallops, or rubs. Pulmonary: Decreased at bases bilaterally; no rales, wheezes, or rhonchi. Abdomen: Soft, non tender, bowel sounds present. Extremities: Mild bilateral lower extremity edema. Wounds: Clean and dry.  No erythema or signs of infection.  Lab Results: CBC: Basename 12/13/11 0825 12/12/11 0400  WBC 9.6 13.4*  HGB 9.6* 9.7*  HCT 28.1* 28.2*  PLT 87* 110*   BMET:  Basename 12/13/11 0825 12/12/11 0400  NA 139 137  K 3.6 3.9  CL 105 103  CO2 23 23  GLUCOSE 128* 193*  BUN 19 12  CREATININE 1.08 1.23  CALCIUM 8.7 8.2*    PT/INR:  Basename 12/10/11 2000  LABPROT 17.9*    INR 1.45   ABG:  INR: Will add last result for INR, ABG once components are confirmed Will add last 4 CBG results once components are confirmed  Assessment/Plan:  1. CV - PVCs. 3 beats of Vtach.Would like to increase Lopressor to 25 mg bid when HR will tolerate. 2.  Pulmonary - Encourage incentive spirometer.CXR this am shows right pneumothorax, bilateral pleural effusions and atelectasis. Check CXR in am. 3. Volume Overload - Diurese. 4.  Acute blood loss anemia - H/H this am 9.6/28.1. 5.Supplement potassium 6.DM-CBGs 123/132/158.Pre op HGA1C 7.Stop scheduled insulin and restart oral medications at a reduced dose. 7.Thrombocytopenia-Platelets decreased from 110 to 87,000.On lovenox.Monitor. 8.LOC constipation 9.Protein shakes per patient request.    ZIMMERMAN,DONIELLE MPA-C 12/13/2011

## 2011-12-13 NOTE — Op Note (Signed)
NAME:  Steve [name], Steve [name]                       ACCOUNT NO.:  MEDICAL RECORD NO.:  [id number]  LOCATION:                                 FACILITY:  PHYSICIAN:  Ivin Poot, M.D.  DATE OF BIRTH:  Dec 11, 1940  DATE OF PROCEDURE:  12/10/2011 DATE OF DISCHARGE:                              OPERATIVE REPORT   OPERATION PERFORMED: 1. Coronary artery bypass grafting x4 (left internal mammary artery to     left anterior descending, saphenous vein graft to ramus     intermediate, sequential saphenous vein graft to obtuse marginal 1     and distal posterolateral circumflex). 2. Endoscopic harvest of left leg greater saphenous vein.  SURGEON:  Ivin Poot, M.D.  ASSISTANT:  Lars Pinks, PA-C.  ANESTHESIA:  General.  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  INDICATIONS:  The patient is a 71 year old Caucasian male with symptoms of accelerating angina-acute coronary syndrome, who presented to the hospital with some nonspecific EKG changes and negative enzymes. Cardiac catheterization by Dr. Loveah Like [place] demonstrated a significant left main stenosis, which compromised flow to the circumflex, ramus intermediate, and the LAD.  The right coronary was small and nondominant with some disease before it bifurcated into too small vessels.  LV function was fairly well preserved.  The patient was felt to be a candidate for surgical revascularization.  Prior to surgery, I examined the patient in his hospital room and reviewed the results of the cardiac cath with the patient and family.  I discussed indications and expected benefits of coronary artery bypass surgery for treatment of his coronary artery disease.  I reviewed the major details of surgery including use of general anesthesia, cardiopulmonary bypass, the expected hospital recovery, and the alternatives to surgery.  I  discussed with the patient risks to him of this operation including risks of stroke, MI, bleeding, infection, and death.  After reviewing these issues, he demonstrated his understanding and agreed to proceed with the surgery under what I felt was an informed consent.  OPERATIVE FINDINGS: 1. Severe multivessel coronary artery disease with adequate targets,     but with intramyocardial ramus and LAD. 2. Adequate quality conduit. 3. No blood transfusion products needed.  PROCEDURE:  The patient was brought to the operating room and placed supine on the operating table.  General anesthesia was induced under invasive hemodynamic monitoring.  A proper time-out was performed.  A chest incision was made as the saphenous vein was harvested endoscopically from the left leg.  The left internal mammary artery was harvested as a pedicle graft from its origin at the subclavian vessels. It was a good vessel with excellent flow.  The pericardium was opened and the sternal retractor was placed.  Pursestrings were placed in the ascending aorta and right atrium and heparin was administered.  When the ACT was documented as being therapeutic, the patient was cannulated and placed on cardiopulmonary bypass.  The coronaries were identified for grafting, and the mammary artery and vein grafts were prepared for the distal anastomoses.  Cardioplegic cannula was replaced  for both antegrade and retrograde cold blood cardioplegia and the patient was cooled to 32 degrees.  The aortic crossclamp was applied.  1 L of cold blood cardioplegia was delivered in split doses between the antegrade aortic and retrograde coronary sinus catheters.  There is good cardioplegic arrest and septal temperature dropped less than 14 degrees. Cardioplegia was delivered every 20 minutes while the cross-clamp was applied.  The distal coronary anastomoses were then performed.  The first distal anastomosis was to the ramus branch of the  LAD.  It was intramyocardial, 1.5 mm, and a reverse saphenous vein was sewn end-to-side with running 7- 0 Prolene with good flow.  The second and third distal anastomoses consisted of a sequential vein graft to the OM1 and distal posterolateral branch of the circumflex.  The OM1 was 1.5 mm vessel and the vein was sewn side-to-side with running 7-0 Prolene with good flow through the graft.  The third distal anastomosis was the continuation of the sequential vein graft to the posterolateral.  It was a large 1.5-1.6 mm vessel.  In the end of the vein, it was sewn end-to-side running 7-0 Prolene with good flow through graft.  Cardioplegia was redosed.  The fourth distal anastomosis was to the distal LAD.  More proximally, it was deeply intramyocardial.  The left IMA pedicle was brought through an opening in the left lateral pericardium and was brought down onto the LAD and sewn end-to-side with running 8-0 Prolene.  There was good flow through the anastomosis after briefly releasing the pedicle bulldog on the mammary artery.  The bulldog was reapplied and the pedicle was secured to the epicardium.  Cardioplegia was redosed.  The crossclamp was still in place, two proximal vein anastomoses were performed, one for the sequential vein graft to the OM1 posterolateral and one to the ramus intermediate.  Prior to tying down the final proximal anastomosis, air was vented from the coronaries with a dose of retrograde warm blood cardioplegia.  The crossclamp was removed.  Heart resumed a spontaneous rhythm.  The grafts were checked and each had good flow.  Hemostasis was documented at the proximal and distal anastomoses.  The patient was rewarmed and reperfused.  Temporary pacing wires were applied.  The lungs were re-expanded.  The patient was weaned off bypass without inotropes.  Cardiac output and blood pressure were stable.  Protamine was administered without adverse reaction.  The cannula was  removed.  The mediastinum was irrigated with warm saline. The superior pericardial fat was closed over the aorta.  One anterior mediastinal and one left pleural chest tube were placed and brought out through separate incisions.  The sternum was closed with steel wire. The pectoralis fascia was closed with a running #1 Vicryl and subcutaneous was closed with a 2-0 Vicryl.  The skin was closed with a 3- 0 subcuticular.  Sterile dressings were applied.  Total cardiopulmonary bypass time was 110 minutes.     Ivin Poot, M.D.    PV/MEDQ  D:  12/12/2011  T:  12/12/2011  Job:  [id number]  cc:   Donyale Berthold M. [place], M.D.

## 2011-12-14 ENCOUNTER — Inpatient Hospital Stay (HOSPITAL_COMMUNITY): Payer: Medicare Other

## 2011-12-14 ENCOUNTER — Other Ambulatory Visit: Payer: Self-pay

## 2011-12-14 LAB — BASIC METABOLIC PANEL
BUN: 14 mg/dL (ref 6–23)
Chloride: 100 mEq/L (ref 96–112)
GFR calc Af Amer: >90 mL/min (ref >90)
Potassium: 3.2 mEq/L — ABNORMAL LOW (ref 3.5–5.1)

## 2011-12-14 LAB — CBC
HCT: 28.8 % — ABNORMAL LOW (ref 39.0–52.0)
MCV: 91.7 fL (ref 78.0–100.0)
RBC: 3.14 MIL/uL — ABNORMAL LOW (ref 4.22–5.81)
WBC: 8.2 10*3/uL (ref 4.0–10.5)

## 2011-12-14 LAB — MAGNESIUM: Magnesium: 1.9 mg/dL (ref 1.5–2.5)

## 2011-12-14 LAB — GLUCOSE, CAPILLARY
Glucose-Capillary: 107 mg/dL — ABNORMAL HIGH (ref 70–99)
Glucose-Capillary: 149 mg/dL — ABNORMAL HIGH (ref 70–99)

## 2011-12-14 MED ORDER — INSULIN ASPART 100 UNIT/ML ~~LOC~~ SOLN
0.0000 [IU] | Freq: Three times a day (TID) | SUBCUTANEOUS | Status: DC
  Administered 2011-12-15 – 2011-12-17 (×5): 2 [IU] via SUBCUTANEOUS

## 2011-12-14 MED ORDER — GUAIFENESIN ER 600 MG PO TB12
600.0000 mg | ORAL_TABLET | Freq: Two times a day (BID) | ORAL | Status: DC
  Administered 2011-12-14 – 2011-12-17 (×7): 600 mg via ORAL
  Filled 2011-12-14 (×9): qty 1

## 2011-12-14 MED ORDER — METOPROLOL TARTRATE 12.5 MG HALF TABLET
12.5000 mg | ORAL_TABLET | Freq: Four times a day (QID) | ORAL | Status: DC
  Administered 2011-12-14 (×2): 12.5 mg via ORAL
  Filled 2011-12-14 (×6): qty 1

## 2011-12-14 MED ORDER — METOPROLOL TARTRATE 12.5 MG HALF TABLET
12.5000 mg | ORAL_TABLET | Freq: Once | ORAL | Status: AC
  Administered 2011-12-14: 12.5 mg via ORAL
  Filled 2011-12-14: qty 1

## 2011-12-14 MED ORDER — POTASSIUM CHLORIDE CRYS ER 20 MEQ PO TBCR
40.0000 meq | EXTENDED_RELEASE_TABLET | Freq: Once | ORAL | Status: AC
  Administered 2011-12-14: 40 meq via ORAL
  Filled 2011-12-14: qty 2

## 2011-12-14 MED ORDER — INSULIN ASPART 100 UNIT/ML ~~LOC~~ SOLN
0.0000 [IU] | SUBCUTANEOUS | Status: DC
  Filled 2011-12-14: qty 3

## 2011-12-14 MED ORDER — METOPROLOL TARTRATE 25 MG PO TABS
25.0000 mg | ORAL_TABLET | Freq: Four times a day (QID) | ORAL | Status: DC
  Administered 2011-12-14 – 2011-12-15 (×4): 25 mg via ORAL
  Filled 2011-12-14 (×8): qty 1

## 2011-12-14 MED FILL — Sodium Bicarbonate IV Soln 8.4%: INTRAVENOUS | Qty: 50 | Status: AC

## 2011-12-14 MED FILL — Lidocaine HCl IV Inj 20 MG/ML: INTRAVENOUS | Qty: 5 | Status: AC

## 2011-12-14 MED FILL — Heparin Sodium (Porcine) Inj 1000 Unit/ML: INTRAMUSCULAR | Qty: 30 | Status: AC

## 2011-12-14 MED FILL — Heparin Sodium (Porcine) Inj 1000 Unit/ML: INTRAMUSCULAR | Qty: 10 | Status: AC

## 2011-12-14 MED FILL — Sodium Chloride IV Soln 0.9%: INTRAVENOUS | Qty: 1000 | Status: AC

## 2011-12-14 MED FILL — Electrolyte-R (PH 7.4) Solution: INTRAVENOUS | Qty: 1000 | Status: AC

## 2011-12-14 MED FILL — Sodium Chloride Irrigation Soln 0.9%: Qty: 3000 | Status: AC

## 2011-12-14 MED FILL — Mannitol IV Soln 20%: INTRAVENOUS | Qty: 500 | Status: AC

## 2011-12-14 NOTE — Progress Notes (Signed)
Pt having runs of vtach 4-30 beats, PA on call notified, orders received, will continue to treat and monitor closely. Steve [name]

## 2011-12-14 NOTE — Plan of Care (Signed)
Problem: Phase III Progression Outcomes Goal: Transfer to PCTU/Telemetry POD Outcome: Completed in [facility] Date Met:  12/14/11 POD 3

## 2011-12-14 NOTE — Progress Notes (Signed)
Pt HR and rhythm is irregular with frequent PVCs,B/P 145/84,O2 sturation is 95% on 4LPM/[place],Dr.Gerhardt made aware with order to care cardiologist fellow to check the rhythm.Will continue to monitor. Tamila Gaulin Therapist, sports. At [facility] Dr Dorothea Ogle (cardiology) came with orders made.Will continue to monitor. Pilar Corrales Rn

## 2011-12-14 NOTE — Progress Notes (Addendum)
                    [address]            [place] [address]          [phone]     4 Days Post-Op Procedure(s) (LRB): CORONARY ARTERY BYPASS GRAFTING (CABG) (N/A)  Subjective: Feels better today.  Less SOB, but coughing more, nonproductive.  Objective: Vital signs in last 24 hours: Patient Vitals for the past 24 hrs:  BP Temp Temp src Pulse Resp SpO2 Weight  12/14/11 0849 - - - - - 92 % -  12/14/11 0300 149/57 mmHg 97.7 F (36.5 C) Oral 77  18  95 % 89 kg (196 lb 3.4 oz)  12/14/11 0030 138/64 mmHg 98.8 F (37.1 C) Oral 102  18  - -  12/13/11 2047 - - - - - 94 % -  12/13/11 2010 156/65 mmHg 98.9 F (37.2 C) Oral 79  18  92 % -  12/13/11 1343 153/65 mmHg 98.3 F (36.8 C) Oral 77  18  90 % -  12/13/11 1238 - - - - - 92 % -   Current Weight  12/14/11 89 kg (196 lb 3.4 oz)   Pre-op wt= 89 kg  Intake/Output from previous day: 02/18 0701 - 02/19 0700 In: 240 [P.O.:240] Out: 1050 [Urine:1050]  CBGs 132-141-133-132  PHYSICAL EXAM:  Heart: RRR, freq PVCs Lungs: clear Wound: clean and dry Extremities: trace LE edema  Lab Results: CBC: Basename 12/13/11 0825 12/12/11 0400  WBC 9.6 13.4*  HGB 9.6* 9.7*  HCT 28.1* 28.2*  PLT 87* 110*   BMET:  Basename 12/13/11 0825 12/12/11 0400  NA 139 137  K 3.6 3.9  CL 105 103  CO2 23 23  GLUCOSE 128* 193*  BUN 19 12  CREATININE 1.08 1.23  CALCIUM 8.7 8.2*    PT/INR: No results found for this basename: LABPROT,INR in the last 72 hours   Assessment/Plan: S/P Procedure(s) (LRB): CORONARY ARTERY BYPASS GRAFTING (CABG) (N/A) CV- SR with frequent PVCs.  BPs trending up. Will increase beta blocker dose and monitor. DM- sugars stable on po meds. Pulm- continue IS, wean O2, will add Mucinex . Vol overload- diurese CRPI   LOS: 5 days    COLLINS,GINA H 12/14/2011   seen by cardiology for rhythm last night, no note left Continue beta blocker  I have seen and examined [name] and agree  with the above assessment  and plan.  Grace Isaac MD [facility] [phone] Office [phone] 12/14/2011 3:35 PM

## 2011-12-14 NOTE — Progress Notes (Signed)
CARDIAC REHAB PHASE I   PRE:  Rate/Rhythm: 98SR  BP:  Supine:   Sitting: 159/71  Standing:    SaO2: 93%4L  MODE:  Ambulation: 240 ft   POST:  Rate/Rhythem: 126St with 3-6 beat runs VT  BP:  Supine:   Sitting: 152/70  Standing:    SaO2: 94%6L 1407-1425 Pt walked 240 ft on 6L oxygen with rolling walker and asst x 1. Did not need to rest during walk. Had monitor tech watch for arrhythmia. Pt had 3-6 beat runs VT during walk asymptomatically. To recliner with call bell after walk.  Pt back to 4L.  Jeani Sow

## 2011-12-15 ENCOUNTER — Other Ambulatory Visit: Payer: Self-pay

## 2011-12-15 LAB — GLUCOSE, CAPILLARY
Glucose-Capillary: 133 mg/dL — ABNORMAL HIGH (ref 70–99)
Glucose-Capillary: 101 mg/dL — ABNORMAL HIGH (ref 70–99)
Glucose-Capillary: 114 mg/dL — ABNORMAL HIGH (ref 70–99)

## 2011-12-15 LAB — BASIC METABOLIC PANEL
Chloride: 101 mEq/L (ref 96–112)
GFR calc Af Amer: >90 mL/min (ref >90)
Potassium: 3.5 mEq/L (ref 3.5–5.1)
Sodium: 137 mEq/L (ref 135–145)

## 2011-12-15 MED ORDER — OXYCODONE HCL 5 MG PO TABS: 5.0000 mg | ORAL_TABLET | ORAL | Status: AC | PRN

## 2011-12-15 MED ORDER — METFORMIN HCL 1000 MG PO TABS: 1000.0000 mg | ORAL_TABLET | Freq: Two times a day (BID) | ORAL | Status: DC

## 2011-12-15 MED ORDER — METOPROLOL TARTRATE 50 MG PO TABS: 50.0000 mg | ORAL_TABLET | Freq: Two times a day (BID) | ORAL | Status: DC

## 2011-12-15 MED ORDER — LISINOPRIL 10 MG PO TABS: 10.0000 mg | ORAL_TABLET | Freq: Every day | ORAL | Status: DC

## 2011-12-15 MED ORDER — ASPIRIN 325 MG PO TBEC: 325.0000 mg | DELAYED_RELEASE_TABLET | Freq: Every day | ORAL | Status: AC

## 2011-12-15 MED ORDER — LISINOPRIL 10 MG PO TABS
10.0000 mg | ORAL_TABLET | Freq: Every day | ORAL | Status: DC
  Administered 2011-12-16: 10 mg via ORAL
  Filled 2011-12-15 (×2): qty 1

## 2011-12-15 MED ORDER — POTASSIUM CHLORIDE CRYS ER 20 MEQ PO TBCR
EXTENDED_RELEASE_TABLET | ORAL | Status: AC
  Administered 2011-12-15: 20 meq via ORAL
  Filled 2011-12-15: qty 1

## 2011-12-15 MED ORDER — METOPROLOL TARTRATE 50 MG PO TABS
50.0000 mg | ORAL_TABLET | Freq: Two times a day (BID) | ORAL | Status: DC
  Administered 2011-12-15: 50 mg via ORAL
  Filled 2011-12-15 (×3): qty 1

## 2011-12-15 MED ORDER — FUROSEMIDE 40 MG PO TABS: 40.0000 mg | ORAL_TABLET | Freq: Every day | ORAL | Status: DC

## 2011-12-15 MED ORDER — FUROSEMIDE 40 MG PO TABS
40.0000 mg | ORAL_TABLET | Freq: Every day | ORAL | Status: DC
  Administered 2011-12-15 – 2011-12-17 (×3): 40 mg via ORAL
  Filled 2011-12-15 (×3): qty 1

## 2011-12-15 MED ORDER — LIDOCAINE HCL (CARDIAC) 10 MG/ML IV SOLN
100.0000 mg | Freq: Once | INTRAVENOUS | Status: AC
  Administered 2011-12-15: 100 mg via INTRAVENOUS
  Filled 2011-12-15: qty 10

## 2011-12-15 MED ORDER — GUAIFENESIN ER 600 MG PO TB12: 600.0000 mg | ORAL_TABLET | Freq: Two times a day (BID) | ORAL | Status: DC

## 2011-12-15 MED ORDER — POTASSIUM CHLORIDE CRYS ER 20 MEQ PO TBCR
20.0000 meq | EXTENDED_RELEASE_TABLET | Freq: Every day | ORAL | Status: DC
  Administered 2011-12-15: 20 meq via ORAL
  Filled 2011-12-15 (×3): qty 1

## 2011-12-15 MED ORDER — LIDOCAINE IN D5W 4-5 MG/ML-% IV SOLN
1.0000 mg/min | INTRAVENOUS | Status: DC
  Administered 2011-12-15: 1 mg/min via INTRAVENOUS
  Filled 2011-12-15: qty 500
  Filled 2011-12-15: qty 250

## 2011-12-15 MED ORDER — POTASSIUM CHLORIDE CRYS ER 20 MEQ PO TBCR
20.0000 meq | EXTENDED_RELEASE_TABLET | Freq: Two times a day (BID) | ORAL | Status: DC
  Administered 2011-12-15: 20 meq via ORAL
  Filled 2011-12-15 (×2): qty 1

## 2011-12-15 MED ORDER — POTASSIUM CHLORIDE CRYS ER 20 MEQ PO TBCR: 20.0000 meq | EXTENDED_RELEASE_TABLET | Freq: Every day | ORAL | Status: DC

## 2011-12-15 NOTE — Progress Notes (Addendum)
5 Days Post-Op Procedure(s) (LRB): CORONARY ARTERY BYPASS GRAFTING (CABG) (N/A)  Subjective: Patient receiving a breathing treatment this am. Wants to go home.  Objective: Vital signs in last 24 hours: Patient Vitals for the past 24 hrs:  BP Temp Temp src Pulse Resp SpO2 Weight  12/15/11 0613 159/72 mmHg 98.1 F (36.7 C) Oral 82  18  97 % 194 lb 0.1 oz (88 kg)  12/14/11 2123 150/64 mmHg 98.2 F (36.8 C) Oral 91  18  97 % -  12/14/11 2022 - - - - - 96 % -  12/14/11 1541 - - - - - 94 % -  12/14/11 1323 143/61 mmHg 98.7 F (37.1 C) Oral 83  20  95 % -  12/14/11 1311 143/67 mmHg - - 92  - - -  12/14/11 1155 - - - - - 91 % -  12/14/11 0849 - - - - - 92 % -   Pre op weight 89  kg Current Weight  12/15/11 194 lb 0.1 oz (88 kg)      Intake/Output from previous day: 02/19 0701 - 02/20 0700 In: 480 [P.O.:480] Out: 1750 [Urine:1750]   Physical Exam:  Cardiovascular: RRR, no murmurs, gallops, or rubs. Pulmonary: Diminished at bases; no rales, wheezes, or rhonchi. Abdomen: Soft, non tender, bowel sounds present. Extremities: Mild bilateral lower extremity edema. Wounds: Clean and dry.  No erythema or signs of infection.  Lab Results: CBC: Basename 12/14/11 1016 12/13/11 0825  WBC 8.2 9.6  HGB 9.9* 9.6*  HCT 28.8* 28.1*  PLT 126* 87*   BMET:  Basename 12/15/11 0503 12/14/11 1231  NA 137 140  K 3.5 3.2*  CL 101 100  CO2 25 27  GLUCOSE 121* 64*  BUN 16 14  CREATININE 0.93 0.97  CALCIUM 9.2 9.3    PT/INR: No results found for this basename: LABPROT,INR in the last 72 hours ABG:  INR: Will add last result for INR, ABG once components are confirmed Will add last 4 CBG results once components are confirmed  Assessment/Plan:  1. CV - SR. On lopressor 25 Q 6. Will change to 50 bid. 2.  Pulmonary - Encourage incentive spirometer.Wean off O2 as tolerates.Is requiring 4-6 L via [place]. May need home O2. 3. Volume Overload - Change IV Lasix to PO. 4.  Acute blood loss anemia  - H/H this am slightly increased to 9.9/28.8. 5.Thrombocytopenia-Platelets increased from 87 to 126,000. 6.DM-CBGs 107/149/133.Continue Glyburide 5 bid, Metformin 500 bid. 7.Loose stools so will stop stool softeners. 8.Possibly discharge in am.   ZIMMERMAN,DONIELLE MPA-C 12/15/2011   patient examined and medical record reviewed,agree with above note. [name],Ramiyah Mcclenahan 12/15/2011

## 2011-12-15 NOTE — Progress Notes (Signed)
Pt ambulatedabout 400 ft using walker on 4LPM/[place] tolerrated well. Lovetta Condie RN

## 2011-12-15 NOTE — Discharge Summary (Signed)
patient examined and medical record reviewed,agree with above note. [name],Steve [name] 12/15/2011

## 2011-12-15 NOTE — Discharge Instructions (Signed)
Activity: 1.May walk up steps                2.No lifting more than ten pounds for four weeks.                 3.No driving for four weeks.                4.Stop any activity that causes chest pain, shortness of breath, dizziness, sweating or excessive weakness.                5.Avoid straining.                6.Continue with your breathing exercises daily.  Diet: Diabetic diet and Low fat, Low salt diet  Wound Care: May shower.  Clean wounds with mild soap and water daily. Contact the office at [phone] if any problems arise. Coronary Artery Bypass Grafting Care After Refer to this sheet in the next few weeks. These instructions provide you with information on caring for yourself after your procedure. Your caregiver may also give you more specific instructions. Your treatment has been planned according to current medical practices, but problems sometimes occur. Call your caregiver if you have any problems or questions after your procedure.  Recovery from open heart surgery will be different for everyone. Some people feel well after 3 or 4 weeks, while for others it takes longer. After heart surgery, it may be normal to:  Not have an appetite, feel nauseated by the smell of food, or only want to eat a small amount.   Be constipated because of changes in your diet, activity, and medicines. Eat foods high in fiber. Add fresh fruits and vegetables to your diet. Stool softeners may be helpful.   Feel sad or unhappy. You may be frustrated or cranky. You may have good days and bad days. Do not give up. Talk to your caregiver if you do not feel better.   Feel weakness and fatigue. You many need physical therapy or cardiac rehabilitation to get your strength back.   Develop an irregular heartbeat called atrial fibrillation. Symptoms of atrial fibrillation are a fast, irregular heartbeat or feelings of fluttery heartbeats, shortness of breath, low blood pressure, and dizziness. If these symptoms  develop, see your caregiver right away.  MEDICATION  Have a list of all the medicines you will be taking when you leave the hospital. For every medicine, know the following:   Name.   Exact dose.   Time of day to be taken.   How often it should be taken.   Why you are taking it.   Ask which medicines should or should not be taken together. If you take more than one heart medicine, ask if it is okay to take them together. Some heart medicines should not be taken at the same time because they may lower your blood pressure too much.   Narcotic pain medicine can cause constipation. Eat fresh fruits and vegetables. Add fiber to your diet. Stool softener medicine may help relieve constipation.   Keep a copy of your medicines with you at all times.   Do not add or stop taking any medicine until you check with your caregiver.   Medicines can have side effects. Call your caregiver who prescribed the medicine if you:   Start throwing up, have diarrhea, or have stomach pain.   Feel dizzy or lightheaded when you stand up.   Feel your heart is skipping beats or is beating  too fast or too slow.   Develop a rash.   Notice unusual bruising or bleeding.  HOME CARE INSTRUCTIONS  After heart surgery, it is important to learn how to take your pulse. Have your caregiver show you how to take your pulse.   Use your incentive spirometer. Ask your caregiver how long after surgery you need to use it.  Care of your chest incision  Tell your caregiver right away if you notice clicking in your chest (sternum).   Support your chest with a pillow or your arms when you take deep breaths and cough.   Follow your caregiver's instructions about when you can bathe or swim.   Protect your incision from sunlight during the first year to keep the scar from getting dark.   Tell your caregiver if you notice:   Increased tenderness of your incision.   Increased redness or swelling around your incision.    Drainage or pus from your incision.  Care of your leg incision(s)  Avoid crossing your legs.   Avoid sitting for long periods of time. Change positions every half hour.   Elevate your leg(s) when you are sitting.   Check your leg(s) daily for swelling. Check the incisions for redness or drainage.   Wear your elastic stockings as told by your caregiver. Take them off at bedtime.  Diet  Diet is very important to heart health.   Eat plenty of fresh fruits and vegetables. Meats should be lean cut. Avoid canned, processed, and fried foods.   Talk to a dietician. They can teach you how to make healthy food and drink choices.  Weight  Weigh yourself every day. This is important because it helps to know if you are retaining fluid that may make your heart and lungs work harder.   Use the same scale each time.   Weigh yourself every morning at the same time. You should do this after you go to the bathroom, but before you eat breakfast.   Your weight will be more accurate if you do not wear any clothes.   Record your weight.   Tell your caregiver if you have gained 2 pounds or more overnight.  Activity Stop any activity at once if you have chest pain, shortness of breath, irregular heartbeats, or dizziness. Get help right away if you have any of these symptoms.  Bathing.  Avoid soaking in a bath or hot tub until your incisions are healed.   Rest. You need a balance of rest and activity.   Exercise. Exercise per your caregiver's advice. You may need physical therapy or cardiac rehabilitation to help strengthen your muscles and build your endurance.   Climbing stairs. Unless your caregiver tells you not to climb stairs, go up stairs slowly and rest if you tire. Do not pull yourself up by the handrail.   Driving a car. Follow your caregiver's advice on when you may drive. You may ride as a passenger at any time. When traveling for long periods of time in a car, get out of the car and  walk around for a few minutes every 2 hours.   Lifting. Avoid lifting, pushing, or pulling anything heavier than 10 pounds for 6 weeks after surgery or as told by your caregiver.   Returning to work. Check with your caregiver. People heal at different rates. Most people will be able to go back to work 6 to 12 weeks after surgery.   Sexual activity. You may resume sexual relations as told  by your caregiver.  SEEK MEDICAL CARE IF:  Any of your incisions are red, painful, or have any type of drainage coming from them.   You have an oral temperature above 102 F (38.9 C).   You have ankle or leg swelling.   You have pain in your legs.   You have weight gain of 2 or more pounds a day.   You feel dizzy or lightheaded when you stand up.  SEEK IMMEDIATE MEDICAL CARE IF:  You have angina or chest pain that goes to your jaw or arms. Call your local emergency services right away.   You have shortness of breath at rest or with activity.   You have a fast or irregular heartbeat (arrhythmia).   There is a "clicking" in your sternum when you move.   You have numbness or weakness in your arms or legs.  MAKE SURE YOU:  Understand these instructions.   Will watch your condition.   Will get help right away if you are not doing well or get worse.  Document Released: 04/30/2005 Document Revised: 06/23/2011 Document Reviewed: 12/16/2010 [facility] Patient Information 2012 [place].

## 2011-12-15 NOTE — Discharge Summary (Addendum)
Physician Discharge Summary  Patient ID: Steve [name]. MRN: [id number] DOB/AGE: 1941/02/12 71 y.o.  Admit date: 12/09/2011 Discharge date: 12/16/2011  Admission Diagnoses: 1.History of CAD 2.History of DM 3.History of hypertension 4.History of PAD 5.History of remote tobacco abuse  Discharge Diagnoses:  1.History of CAD 2.History of DM 3.History of hypertension 4.History of PAD 5.History of remote tobacco abuse 6.ABL anemia 7.Thrombocytopenia 8.PVCs,possible afib post op   Procedure (s): 1.Cardiac Catheterization done by Dr. Martinique 12/09/2011 Coronary dominance: Left  Left mainstem: The left main coronary is heavily calcified. There is 50% narrowing in the distal left main.  Left anterior descending (LAD): This is a large vessel which is heavily calcified proximally. There is a 90-95% stenosis at the ostium. There is TIMI grade 2 flow to the distal LAD.  There is a ramus intermediate branch which has a 50% stenosis proximally.  Left circumflex (LCx): This is a large dominant vessel. There is 50% narrowing at the ostium at its takeoff from the left main. Otherwise no significant disease noted.  Right coronary artery (RCA): This is a nondominant vessel with an 80% stenosis in the mid vessel.  Left ventriculography: Left ventricular systolic function is normal, LVEF is estimated at 55-65%, there is no significant mitral regurgitation  2.CORONARY ARTERY BYPASS GRAFTING (CABG) (N/A) x4 (left IMA to LAD, sequential saphenous vein graft to OM1 and distal circumflex, saphenous vein graft to ramus intermediate) with EVH from the left leg by Dr. Prescott Gum on 12/10/2011.   History of Presenting Illness: The patient is a 71 year old Caucasian male diabetic ex-smoker with a history of coronary disease and progressive angina. The patient  noted his chest pain to increase in severity and frequency as well as with less amount of exertion. He had an episode of resting pain the day before admission.  His cardiac enzymes were negative. . Cardiac catheterization by Dr. Martinique on 12/09/2011 demonstrated a 60% left main stenosis,  a 90% ostial LAD stenosis,a 50% circumflex stenosis, and a 80% stenosis of a codominant right coronary. His EF was 60% and LVEDP 10 mmHg. A cardiothoracic consultation was obtained with Dr. Prescott Gum for the consideration of coronary artery bypass grafting surgery. Potential risks, benefits, complications of the surgery discussed with the patient and he agreed to proceed. He underwent CABG x4 on 12/10/2011.  Brief Hospital Course:  He was extubated later the evening of surgery without difficulty. He remained afebrile and hemodynamic is stable. His Swan-Ganz, A-line, chest tubes, and Foley were all removed early in his postoperative course. He was started on a low-dose beta blocker, which was titrated accordingly. He was found to be volume overloaded and diuresed. He was also found have acute blood loss anemia. His H&H was low as 9.6 and 28.1. His last H&H was up to 9.9 and 20.8 He did not require postoperative transfusion. He also had thrombocytopenia postoperatively. His last platelet count was up to 126,000. He was requiring a couple liters of oxygen via nasal cannula postoperatively. He will most likely require oxygen at the time of discharge. He was felt surgically stable for transfer from the intensive care to [facility] for further convalescence on 12/12/2011. He was tolerating a diet and had a bowel movement. He continued to progress with cardiac rehabilitation. Epicardial pacing wires were removed on 12/15/2011. Chest tube sutures will be removed the day of discharge. Provided he remains afebrile, him in a stable, is weaned down to at least 2 L or off O2, and pending around evaluation, he'll  be surgically stable for discharge on 12/16/2011.  Filed Vitals:   12/15/11 0613  BP: 159/72  Pulse: 82  Temp: 98.1 F (36.7 C)  Resp: 18     Latest Vital Signs: Blood pressure 159/72,  pulse 82, temperature 98.1 F (36.7 C), temperature source Oral, resp. rate 18, height 5\' 10"  (1.778 m), weight 194 lb 0.1 oz (88 kg), SpO2 99.00%.  Physical Exam: Cardiovascular: RRR, no murmurs, gallops, or rubs.  Pulmonary: Diminished at bases; no rales, wheezes, or rhonchi.  Abdomen: Soft, non tender, bowel sounds present.  Extremities: Mild bilateral lower extremity edema.  Wounds: Clean and dry. No erythema or signs of infection.  Discharge Condition:Stable  Recent laboratory studies:  Lab Results  Component Value Date   WBC 8.2 12/14/2011   HGB 9.9* 12/14/2011   HCT 28.8* 12/14/2011   MCV 91.7 12/14/2011   PLT 126* 12/14/2011   Lab Results  Component Value Date   NA 137 12/15/2011   K 3.5 12/15/2011   CL 101 12/15/2011   CO2 25 12/15/2011   CREATININE 0.93 12/15/2011   GLUCOSE 121* 12/15/2011      Diagnostic Studies: Dg Chest 2 View  12/14/2011  *RADIOLOGY REPORT*  Clinical Data: Evaluate right-sided pneumothorax.  CHEST - 2 VIEW  Comparison: Multiple priors, most recently 12/13/2011.  Findings: There continues to be a trace right apical pneumothorax, significantly decreased compared to the prior examination.  Small bilateral pleural effusions are also present.  Bibasilar opacities are similar, and are favored to represent areas of bilateral lower lobe atelectasis and/or scarring (underlying air space consolidation is difficult to exclude but is not favored). Pulmonary vascular crowding without frank pulmonary edema.  Heart size is upper limits of normal. The patient is rotated to the right on today's exam, resulting in distortion of the mediastinal contours and reduced diagnostic sensitivity and specificity for mediastinal pathology.  Atherosclerosis in the thoracic aorta. Status post median sternotomy for CABG.  Epicardial pacing wires remain in place.  IMPRESSION: 1.  Decreasing small right sided hydropneumothorax. 2.  Unchanged small left pleural effusion. 3.  Unchanged bibasilar  atelectasis.  Original Report Authenticated By: Etheleen Mayhew, M.D.    Discharge Orders    Future Appointments: Provider: Department: Dept Phone: Center:   01/05/2012 12:30 PM Len Childs, MD [facility] [phone] [facility]      Discharge Medications: Medication List  As of 12/15/2011  1:12 PM   STOP taking these medications         amLODipine 5 MG tablet      atenolol 100 MG tablet      nitroGLYCERIN 0.4 MG SL tablet         TAKE these medications         aspirin 325 MG EC tablet   Take 1 tablet (325 mg total) by mouth daily.      cilostazol 100 MG tablet   Commonly known as: PLETAL   Take 100 mg by mouth 2 (two) times daily.      fenofibrate micronized 200 MG capsule   Commonly known as: LOFIBRA   Take 200 mg by mouth daily before breakfast.      furosemide 40 MG tablet   Commonly known as: LASIX   Take 1 tablet (40 mg total) by mouth daily. For 4 days then stop.      glyBURIDE 5 MG tablet   Commonly known as: DIABETA   Take 5 mg by mouth 2 (two) times daily with a meal.  guaiFENesin 600 MG 12 hr tablet   Commonly known as: MUCINEX   Take 1 tablet (600 mg total) by mouth 2 (two) times daily. For cough.      lisinopril 20 MG tablet   Commonly known as: PRINIVIL,ZESTRIL   Take 1 tablet 20 mg total) by mouth daily.      metFORMIN 1000 MG tablet   Commonly known as: GLUCOPHAGE   Take 1 tablet (1,000 mg total) by mouth 2 (two) times daily with a meal.      metoprolol 50 MG tablet   Commonly known as: LOPRESSOR   Take 1.5 tablet (75 mg total) by mouth 2 (two) times daily.      niacin 500 MG tablet   Take 500 mg by mouth daily with breakfast.      oxyCODONE 5 MG immediate release tablet   Commonly known as: Oxy IR/ROXICODONE   Take 1-2 tablets (5-10 mg total) by mouth every 4 (four) hours as needed for pain.      potassium chloride SA 20 MEQ tablet   Commonly known as: K-DUR,KLOR-CON   Take 1 tablet (20 mEq total) by mouth daily. For 4 days  the stop.      VICKS VAPOR INHALER IN   Inhale into the lungs daily as needed. For congestion Amiodarone 400 MG tablet Commonly known as: PACERONE Take 1 tablet (400 mg total) by mouth two times daily for 7 days;then take Amiodarone 400 mg  By mouth daily thereafter.            Follow Up Appointments: Follow-up Information    Follow up with VAN Wilber Oliphant, MD. (PA/LAT CXR to be taken on 01/05/2012 at 11:30 am;Appointment with Dr. Darcey Nora is on 01/05/2012 at 12:30 pm)    Contact information:   [place] [phone]       Follow up with Peter Martinique, MD. (Please call for a follow up appointment for 2 weeks)    Contact information:   [address]          Signed: Lars Pinks MPA-C 12/15/2011, 1:12 PM

## 2011-12-15 NOTE — Progress Notes (Signed)
CARDIAC REHAB PHASE I   PRE:  Rate/Rhythm: 84SR  BP:  Supine:   Sitting: 134/74  Standing:    SaO2: 97%3L  MODE:  Ambulation: 550 ft   POST:  Rate/Rhythem: 103-135 ST runs of3-4 beats VT  BP:  Supine:   Sitting: 152/84  Standing:    SaO2: walked on RA--stayed 89-94% until 300 ft when he dropped to 86% . Had pt rest. He immediately went to 94% and stayed above 89% for rest of walk 0855-0920 Pt did well on RA walk. Stated he did not feel SOB. Dropped only once to 86% when HR was also 135. Quickly recovered as soon as he stopped to rest. Used rolling walker to walk 550 ft. Left off oxygen in room.  Jeani Sow

## 2011-12-15 NOTE — Progress Notes (Signed)
EPW discontinued per protocol. Tips intact. Patient tolerated well. Last INR INR/Prothrombin Time on   .  Patient advised Bedrest X 1 hour. Steve [name] [place]

## 2011-12-16 ENCOUNTER — Other Ambulatory Visit: Payer: Self-pay

## 2011-12-16 DIAGNOSIS — I4719 Other supraventricular tachycardia: Secondary | ICD-10-CM | POA: Diagnosis not present

## 2011-12-16 DIAGNOSIS — I471 Supraventricular tachycardia: Secondary | ICD-10-CM | POA: Diagnosis not present

## 2011-12-16 LAB — BASIC METABOLIC PANEL
BUN: 18 mg/dL (ref 6–23)
CO2: 25 mEq/L (ref 19–32)
Calcium: 9.4 mg/dL (ref 8.4–10.5)
Chloride: 99 mEq/L (ref 96–112)
Creatinine, Ser: 1 mg/dL (ref 0.50–1.35)
GFR calc Af Amer: 85 mL/min — ABNORMAL LOW (ref >90)
GFR calc non Af Amer: 73 mL/min — ABNORMAL LOW (ref >90)
Glucose, Bld: 108 mg/dL — ABNORMAL HIGH (ref 70–99)
Potassium: 3.5 mEq/L (ref 3.5–5.1)
Sodium: 136 mEq/L (ref 135–145)

## 2011-12-16 LAB — GLUCOSE, CAPILLARY

## 2011-12-16 MED ORDER — METOPROLOL TARTRATE 25 MG PO TABS
75.0000 mg | ORAL_TABLET | Freq: Two times a day (BID) | ORAL | Status: DC
  Administered 2011-12-16 – 2011-12-17 (×3): 75 mg via ORAL
  Filled 2011-12-16 (×4): qty 1

## 2011-12-16 MED ORDER — MAGNESIUM OXIDE 400 MG PO TABS
400.0000 mg | ORAL_TABLET | Freq: Every day | ORAL | Status: DC
  Administered 2011-12-16 – 2011-12-17 (×2): 400 mg via ORAL
  Filled 2011-12-16 (×2): qty 1

## 2011-12-16 MED ORDER — AMIODARONE HCL 400 MG PO TABS: 400.0000 mg | ORAL_TABLET | Freq: Two times a day (BID) | ORAL | Status: DC

## 2011-12-16 MED ORDER — POTASSIUM CHLORIDE CRYS ER 20 MEQ PO TBCR
20.0000 meq | EXTENDED_RELEASE_TABLET | Freq: Every day | ORAL | Status: DC
  Administered 2011-12-16: 20 meq via ORAL
  Filled 2011-12-16: qty 1

## 2011-12-16 MED ORDER — AMIODARONE HCL 200 MG PO TABS
400.0000 mg | ORAL_TABLET | Freq: Two times a day (BID) | ORAL | Status: DC
  Administered 2011-12-16 – 2011-12-17 (×4): 400 mg via ORAL
  Filled 2011-12-16 (×5): qty 2

## 2011-12-16 MED ORDER — LIDOCAINE IN D5W 4-5 MG/ML-% IV SOLN
1.0000 mg/min | INTRAVENOUS | Status: DC
  Filled 2011-12-16: qty 250

## 2011-12-16 MED ORDER — AMIODARONE HCL IN DEXTROSE 360-4.14 MG/200ML-% IV SOLN
60.0000 mg/h | INTRAVENOUS | Status: DC
  Administered 2011-12-16: 60 mg/h via INTRAVENOUS
  Filled 2011-12-16 (×4): qty 200

## 2011-12-16 MED ORDER — AMIODARONE HCL IN DEXTROSE 360-4.14 MG/200ML-% IV SOLN
60.0000 mg/h | INTRAVENOUS | Status: AC
  Administered 2011-12-16: 60 mg/h via INTRAVENOUS
  Filled 2011-12-16: qty 200

## 2011-12-16 MED ORDER — POTASSIUM CHLORIDE CRYS ER 20 MEQ PO TBCR
40.0000 meq | EXTENDED_RELEASE_TABLET | Freq: Every day | ORAL | Status: DC
  Administered 2011-12-16 – 2011-12-17 (×2): 40 meq via ORAL
  Filled 2011-12-16: qty 2

## 2011-12-16 MED ORDER — METOPROLOL TARTRATE 50 MG PO TABS: 75.0000 mg | ORAL_TABLET | Freq: Two times a day (BID) | ORAL | Status: DC

## 2011-12-16 NOTE — Progress Notes (Signed)
CARDIAC REHAB PHASE I   PRE:  Rate/Rhythm: 66 SR with PAC and PVC  BP:  Supine:   Sitting: 110/60  Standing:   SaO2: 96 RA MODE:  Ambulation: 450 ft   POST:  Rate/Rhythem: 92 SR with PAC and PVC  BP:  Supine:   Sitting: 130/50  Standing:    SaO2: 97 RA [id number] Assisted X 1 and used walker to ambulate. Gait steady with walker. Does c/o of being more tired today, states he did not sleep well last night. VS stable. To recliner after walk with call light in reach.  Deon Pilling

## 2011-12-16 NOTE — Progress Notes (Signed)
Stopped amino drip per md order.  Will continue to monitor patient and monitor.

## 2011-12-16 NOTE — Progress Notes (Signed)
Pt. Having frequent PVC's and V-tach, heart rate increasing with ambulation. Pt encouraged to go back to room to rest. No complaints of pain or SOB. VS taken, B/P 161/78. EKG done, and MD notified and orders given. Orders carried out. Charge nurse called Physiological scientist and both said it was fine for patient to stay on this floor with medication he was given. Pt. Stable, and now resting will continue to monitor.

## 2011-12-16 NOTE — Progress Notes (Signed)
D/c'd CTS per MD order.  Steris applied.  Will continue to monitor.

## 2011-12-16 NOTE — Progress Notes (Signed)
Ambulated with patient 500 feet with rolling walker, assist x 1.  Tolerated well.  Heart rate was sinus in the 90's.  Returned to bed.  Will continue to monitor.

## 2011-12-16 NOTE — Progress Notes (Signed)
UR Completed.  Steve [name] [id number] 12/16/2011

## 2011-12-16 NOTE — Progress Notes (Signed)
Pt. Having V-tach and frequent PVC's. Pt asymptomatic, B/P 171/76, HR 88. Pulse ox 96 ra. EKG done, MD notified, gave orders to give Amiodarone PO and start pt on Amiodarone drip , orders were carried out. Pt on q2 hr vitals, will continue to monitor.

## 2011-12-16 NOTE — Progress Notes (Signed)
TELEMETRY: Reviewed telemetry pt in sinus tachy rate 109, Frequent PACs with aberrancy: Filed Vitals:   12/15/11 2100 12/16/11 0225 12/16/11 0334 12/16/11 0441  BP: 161/75 171/76 135/69 153/53  Pulse: 85 88 84 84  Temp:      TempSrc:    Oral  Resp:    18  Height:      Weight:      SpO2: 96% 96% 99% 97%    Intake/Output Summary (Last 24 hours) at 12/16/11 0813 Last data filed at 12/15/11 1700  Gross per 24 hour  Intake    240 ml  Output    401 ml  Net   -161 ml    SUBJECTIVE Feels well, no chest pain or SOB. Denies any palpitations.  LABS: Basic Metabolic Panel:  Basename 12/16/11 0330 12/15/11 0503 12/14/11 1231  NA 136 137 --  K 3.5 3.5 --  CL 99 101 --  CO2 25 25 --  GLUCOSE 108* 121* --  BUN 18 16 --  CREATININE 1.00 0.93 --  CALCIUM 9.4 9.2 --  MG -- -- 1.9  PHOS -- -- --   Liver Function Tests: No results found for this basename: AST:2,ALT:2,ALKPHOS:2,BILITOT:2,PROT:2,ALBUMIN:2 in the last 72 hours No results found for this basename: LIPASE:2,AMYLASE:2 in the last 72 hours CBC:  Basename 12/14/11 1016 12/13/11 0825  WBC 8.2 9.6  NEUTROABS -- --  HGB 9.9* 9.6*  HCT 28.8* 28.1*  MCV 91.7 92.1  PLT 126* 87*   Radiology/Studies:  Dg Chest 2 View  12/14/2011  *RADIOLOGY REPORT*  Clinical Data: Evaluate right-sided pneumothorax.  CHEST - 2 VIEW  Comparison: Multiple priors, most recently 12/13/2011.  Findings: There continues to be a trace right apical pneumothorax, significantly decreased compared to the prior examination.  Small bilateral pleural effusions are also present.  Bibasilar opacities are similar, and are favored to represent areas of bilateral lower lobe atelectasis and/or scarring (underlying air space consolidation is difficult to exclude but is not favored). Pulmonary vascular crowding without frank pulmonary edema.  Heart size is upper limits of normal. The patient is rotated to the right on today's exam, resulting in distortion of the  mediastinal contours and reduced diagnostic sensitivity and specificity for mediastinal pathology.  Atherosclerosis in the thoracic aorta. Status post median sternotomy for CABG.  Epicardial pacing wires remain in place.  IMPRESSION: 1.  Decreasing small right sided hydropneumothorax. 2.  Unchanged small left pleural effusion. 3.  Unchanged bibasilar atelectasis.  Original Report Authenticated By: Etheleen Mayhew, M.D.    PHYSICAL EXAM General: Well developed, well nourished, in no acute distress. Head: Normocephalic, atraumatic, sclera non-icteric, no xanthomas, nares are without discharge. Neck: Negative for carotid bruits. JVD not elevated. Lungs: Clear bilaterally to auscultation without wheezes, rales, or rhonchi. Breathing is unlabored. Heart: RRR S1 S2 without murmurs, rubs, or gallops.  Abdomen: Soft, non-tender, non-distended with normoactive bowel sounds. No hepatomegaly. No rebound/guarding. No obvious abdominal masses. Msk:  Strength and tone appears normal for age. Extremities: No clubbing, cyanosis or edema.  Distal pedal pulses are 2+ and equal bilaterally. Neuro: Alert and oriented X 3. Moves all extremities spontaneously. Psych:  Responds to questions appropriately with a normal affect.  ASSESSMENT AND PLAN: 1. Arrhythmia. I don't think this is Afib. He does have frequent PACs with runs and aberrancy. 2. CAD s/p CABG 3. Htn 4. PAD 5. DM  Plan: Amiodarone reasonable- will switch to po today. Would increase Lopressor dose since he is still hypertensive. If BP remains high  could increase ACEi.  Principal Problem:  *Unstable angina Active Problems:  Hypertension  Peripheral arterial disease  Hyperlipidemia  Diabetes mellitus, type 2  CAD (coronary artery disease)    Signed, Alexandera Kuntzman Martinique MD, [facility] 12/16/2011 8:18 AM

## 2011-12-16 NOTE — Progress Notes (Addendum)
Rn contacted MD, Nils Pyle concerning Md's previous order given to assigned Rn to start pt on continuous Lidocaine gtt. Per Rapid Response RN Glenard Haring, patient should be transferred to stepdown or ICU if placed on continuous Lidocaine gtt. Per MD, Nils Pyle new orders given,d/c lidocaine gtt, vitals q 2 hours,2l O2 n/c, pt's ectopy possible afib with wide QRS complex, d/c lidocaine gtt, start pt on continuous Amiodarone gtt no bolus @ 60mg  per hour. Orders placed in by Rn. Pt's vitals signs stable, 171/76, 965 ra, heart rate 88. Will continue to monitor.  Alize Borrayo,RN,BSN,PCCN

## 2011-12-16 NOTE — Progress Notes (Addendum)
6 Days Post-Op Procedure(s) (LRB): CORONARY ARTERY BYPASS GRAFTING (CABG) (N/A)  Subjective: Patient was walking and nurse made him get back into bed as was questionable brief NSVT then afib with RVR. Patient stated "he did not feel bad or feel his heart beating funny".  Objective: Vital signs in last 24 hours: Patient Vitals for the past 24 hrs:  BP Temp Temp src Pulse Resp SpO2  12/16/11 0441 153/53 mmHg - Oral 84  18  97 %  12/16/11 0334 135/69 mmHg - - 84  - 99 %  12/16/11 0225 171/76 mmHg - - 88  - 96 %  12/15/11 2100 161/75 mmHg - - 85  - 96 %  12/15/11 2054 161/78 mmHg 98.3 F (36.8 C) Oral 73  18  93 %  12/15/11 2016 - - - 92  18  92 %  12/15/11 1405 147/73 mmHg 98.2 F (36.8 C) Oral 79  18  93 %  12/15/11 0814 - - - - - 99 %   Pre op weight 89  kg Current Weight  12/15/11 194 lb 0.1 oz (88 kg)      Intake/Output from previous day: 02/20 0701 - 02/21 0700 In: 480 [P.O.:480] Out: 701 [Urine:700; Stool:1]   Physical Exam:  Cardiovascular: RRR, no murmurs, gallops, or rubs. Pulmonary: Diminished at bases; no rales, wheezes, or rhonchi. Abdomen: Soft, non tender, bowel sounds present. Extremities: Mild bilateral lower extremity edema. Wounds: Clean and dry.  No erythema or signs of infection.  Lab Results: CBC:  Basename 12/14/11 1016 12/13/11 0825  WBC 8.2 9.6  HGB 9.9* 9.6*  HCT 28.8* 28.1*  PLT 126* 87*   BMET:   Basename 12/16/11 0330 12/15/11 0503  NA 136 137  K 3.5 3.5  CL 99 101  CO2 25 25  GLUCOSE 108* 121*  BUN 18 16  CREATININE 1.00 0.93  CALCIUM 9.4 9.2    PT/INR: No results found for this basename: LABPROT,INR in the last 72 hours ABG:  INR: Will add last result for INR, ABG once components are confirmed Will add last 4 CBG results once components are confirmed  Assessment/Plan:  1. CV -Afib with RVR last evening. Questionalbe brief run of NSVT.SR/ PVCs this am.On Lopressor 50 bid.Stop Amiodarone gttp after oral Amiodarone  given. 2.  Pulmonary - Encourage incentive spirometer. 3. Volume Overload - Continue PO lasix. 4.  Acute blood loss anemia - Last H/H  slightly increased to 9.9/28.8. 5.Thrombocytopenia-Platelets increased from 87 to 126,000. 6.DM-CBGs 101/114/124 .Continue Glyburide 5 bid, Metformin 500 bid. 7.Supplement Potassium. 8.Possible discharge in am.     ZIMMERMAN,DONIELLE MPA-C 12/16/2011    patient examined and medical record reviewed,agree with above note.Rapid irreg HR c/w afib with aberrant conduction. Cont w/ amio and supp K+ [name],Kallen Mccrystal 12/16/2011

## 2011-12-17 ENCOUNTER — Encounter (HOSPITAL_COMMUNITY): Payer: Self-pay

## 2011-12-17 ENCOUNTER — Encounter: Payer: Self-pay | Admitting: Cardiothoracic Surgery

## 2011-12-17 MED ORDER — LISINOPRIL 20 MG PO TABS
20.0000 mg | ORAL_TABLET | Freq: Every day | ORAL | Status: DC
  Administered 2011-12-17: 20 mg via ORAL
  Filled 2011-12-17: qty 1

## 2011-12-17 MED ORDER — AMIODARONE HCL 400 MG PO TABS: 400.0000 mg | ORAL_TABLET | Freq: Two times a day (BID) | ORAL | Status: DC

## 2011-12-17 MED ORDER — LISINOPRIL 20 MG PO TABS: 20.0000 mg | ORAL_TABLET | Freq: Every day | ORAL | Status: DC

## 2011-12-17 NOTE — Progress Notes (Signed)
7 Days Post-Op Procedure(s) (LRB): CORONARY ARTERY BYPASS GRAFTING (CABG) (N/A)  Subjective: Patient without complaints-really wants to go home.  Objective: Vital signs in last 24 hours: Patient Vitals for the past 24 hrs:  BP Temp Temp src Pulse Resp SpO2 Weight  12/17/11 0502 156/73 mmHg 98 F (36.7 C) Oral 79  19  95 % 189 lb 6 oz (85.9 kg)  12/16/11 2225 - - - 77  - - -  12/16/11 2215 141/73 mmHg 97.4 F (36.3 C) Oral 77  18  93 % -  12/16/11 1345 121/66 mmHg 97 F (36.1 C) Oral 77  18  95 % -   Pre op weight 89  kg Current Weight  12/17/11 189 lb 6 oz (85.9 kg)      Intake/Output from previous day: 02/21 0701 - 02/22 0700 In: 2466.4 [P.O.:600; I.V.:1866.4] Out: 450 [Urine:450]   Physical Exam:  Cardiovascular: RRR, no murmurs, gallops, or rubs. Pulmonary: Diminished at bases; no rales, wheezes, or rhonchi. Abdomen: Soft, non tender, bowel sounds present. Extremities: Mild bilateral lower extremity edema. Wounds: Clean and dry.  No erythema or signs of infection.  Lab Results: CBC:  Basename 12/14/11 1016  WBC 8.2  HGB 9.9*  HCT 28.8*  PLT 126*   BMET:   Basename 12/16/11 0330 12/15/11 0503  NA 136 137  K 3.5 3.5  CL 99 101  CO2 25 25  GLUCOSE 108* 121*  BUN 18 16  CREATININE 1.00 0.93  CALCIUM 9.4 9.2    PT/INR: No results found for this basename: LABPROT,INR in the last 72 hours ABG:  INR: Will add last result for INR, ABG once components are confirmed Will add last 4 CBG results once components are confirmed  Assessment/Plan:  1. CV -Afib with RVR, NSVT, PVCs,and some aberrancy.Maintaining SR since mid day yesterday .Continue  Lopressor 75 bid, Amiodarone 400 bid, and will increae Lisinopril to 20 daily as SBP still remains eleveated. 2.  Pulmonary - Encourage incentive spirometer. 3. Volume Overload - Continue PO lasix. 4.  Acute blood loss anemia - Last H/H  slightly increased to 9.9/28.8. 5.Thrombocytopenia-Platelets increased from 87  to 126,000. 6.DM-CBGs  98/104/126.Continue Glyburide 5 bid, Metformin 500 bid. 7.Likely discharge later today.      Jerimy Johanson MPA-C 12/17/2011

## 2011-12-17 NOTE — Progress Notes (Signed)
   CARE MANAGEMENT NOTE 12/17/2011  Patient:  Steve [name], Steve [name]   Account Number:  [id number]  Date Initiated:  12/13/2011  Documentation initiated by:  Tamakia Porto  Subjective/Objective Assessment:   PT S/P CABG ON 12/10/11.  PTA, PT INDEPENDENT, LIVES WITH SPOUSE.     Action/Plan:   MET WITH PT TO DISCUSS DISCHARGE NEEDS.   Anticipated DC Date:  12/15/2011   Anticipated DC Plan:  [place]  CM consult      Choice offered to / List presented to:             Status of service:  Completed, signed off Medicare Important Message given?   (If response is "NO", the following Medicare IM given date fields will be blank) Date Medicare IM given:   Date Additional Medicare IM given:    Discharge Disposition:  HOME/SELF CARE  Per UR Regulation:    Comments:  12/17/11 Pamlea Finder,RN,BSN PT DISCHARGING [facility] NEEDED. Phone [phone]   12/13/11 [place] WIFE TO PROVIDE 24HR CARE AT DC; SON WILL ASSIST WITH CARE AS WELL.  REQUESTS RW FOR HOME.  REFERRAL TO [facility] FOR DME NEEDS.  WILL CONT TO FOLLOW FOR HOME NEEDS AS PT PROGRESSES. Phone [phone]

## 2011-12-17 NOTE — Progress Notes (Signed)
   TELEMETRY: Reviewed telemetry pt in sinus rhythm. Much less ectopy. No significant tachy. Filed Vitals:   12/16/11 1345 12/16/11 2215 12/16/11 2225 12/17/11 0502  BP: 121/66 141/73  156/73  Pulse: 77 77 77 79  Temp: 97 F (36.1 C) 97.4 F (36.3 C)  98 F (36.7 C)  TempSrc: Oral Oral  Oral  Resp: 18 18  19   Height:      Weight:    85.9 kg (189 lb 6 oz)  SpO2: 95% 93%  95%    Intake/Output Summary (Last 24 hours) at 12/17/11 0818 Last data filed at 12/17/11 0504  Gross per 24 hour  Intake 2466.39 ml  Output    450 ml  Net 2016.39 ml    SUBJECTIVE Feels well, no chest pain or SOB. Denies any palpitations.  LABS: Basic Metabolic Panel:  Basename 12/16/11 0330 12/15/11 0503 12/14/11 1231  NA 136 137 --  K 3.5 3.5 --  CL 99 101 --  CO2 25 25 --  GLUCOSE 108* 121* --  BUN 18 16 --  CREATININE 1.00 0.93 --  CALCIUM 9.4 9.2 --  MG -- -- 1.9  PHOS -- -- --   Liver Function Tests: No results found for this basename: AST:2,ALT:2,ALKPHOS:2,BILITOT:2,PROT:2,ALBUMIN:2 in the last 72 hours No results found for this basename: LIPASE:2,AMYLASE:2 in the last 72 hours CBC:  Basename 12/14/11 1016  WBC 8.2  NEUTROABS --  HGB 9.9*  HCT 28.8*  MCV 91.7  PLT 126*   Radiology/Studies:   PHYSICAL EXAM General: Well developed, well nourished, in no acute distress. Head: Normocephalic, atraumatic, sclera non-icteric, no xanthomas, nares are without discharge. Neck: Negative for carotid bruits. JVD not elevated. Lungs: Clear bilaterally to auscultation without wheezes, rales, or rhonchi. Breathing is unlabored. Heart: RRR S1 S2 without murmurs, rubs, or gallops.  Abdomen: Soft, non-tender, non-distended with normoactive bowel sounds. No hepatomegaly. No rebound/guarding.  Msk:  Strength and tone appears normal for age. Extremities: No clubbing, cyanosis or edema.  Distal pedal pulses are 2+ and equal bilaterally. Neuro: Alert and oriented X 3. Moves all extremities  spontaneously. Psych:  Responds to questions appropriately with a normal affect.  ASSESSMENT AND PLAN: 1. Arrhythmia.PACs with aberrancy. Short runs of PAT. Much better with med adjustment. 2. CAD s/p CABG 3. Htn 4. PAD 5. DM  Plan: Agree with DC plans as outlined.  Principal Problem:  *Unstable angina Active Problems:  Hypertension  Peripheral arterial disease  Hyperlipidemia  Diabetes mellitus, type 2  CAD (coronary artery disease)  PAT (paroxysmal atrial tachycardia)    Signed, Khyrin Trevathan Martinique MD, [facility] 12/17/2011 8:18 AM

## 2011-12-27 NOTE — Discharge Summary (Signed)
patient examined and medical record reviewed,agree with above note. [name],Juron Vorhees 12/27/2011

## 2011-12-31 ENCOUNTER — Other Ambulatory Visit: Payer: Self-pay | Admitting: Cardiothoracic Surgery

## 2011-12-31 DIAGNOSIS — I2510 Atherosclerotic heart disease of native coronary artery without angina pectoris: Secondary | ICD-10-CM

## 2012-01-05 ENCOUNTER — Ambulatory Visit
Admission: RE | Admit: 2012-01-05 | Discharge: 2012-01-05 | Disposition: A | Discharge status: Home / Self Care | Payer: Medicare Other | Source: Ambulatory Visit | Attending: Cardiothoracic Surgery | Admitting: Cardiothoracic Surgery

## 2012-01-05 ENCOUNTER — Ambulatory Visit (INDEPENDENT_AMBULATORY_CARE_PROVIDER_SITE_OTHER): Payer: Self-pay | Admitting: Physician Assistant

## 2012-01-05 ENCOUNTER — Encounter: Payer: Self-pay | Admitting: Cardiothoracic Surgery

## 2012-01-05 VITALS — BP 153/66 | HR 92 | Resp 20 | Ht 71.0 in | Wt 188.0 lb

## 2012-01-05 DIAGNOSIS — I2510 Atherosclerotic heart disease of native coronary artery without angina pectoris: Secondary | ICD-10-CM

## 2012-01-05 DIAGNOSIS — Z951 Presence of aortocoronary bypass graft: Secondary | ICD-10-CM

## 2012-01-05 NOTE — Progress Notes (Signed)
  HPI:  Patient returns for routine postoperative follow-up having undergone a CABG x 4 by Dr. Prescott Gum on 12/10/2011. Since hospital discharge the patient reports that he is" feeling better than ever". He denies any complaints.   Current Outpatient Prescriptions  Medication Sig Dispense Refill  . amiodarone (PACERONE) 400 MG tablet Take 1 tablet (400 mg total) by mouth 2 (two) times daily. For 7 days; then take Amiodarone 400 mg po daily thereafter.  60 tablet  1  . Aromatic Inhalants (VICKS VAPOR INHALER IN) Inhale into the lungs daily as needed. For congestion      . cilostazol (PLETAL) 100 MG tablet Take 100 mg by mouth 2 (two) times daily.      . fenofibrate micronized (LOFIBRA) 200 MG capsule Take 200 mg by mouth daily before breakfast.        . glyBURIDE (DIABETA) 5 MG tablet Take 5 mg by mouth 2 (two) times daily with a meal.        . guaiFENesin (MUCINEX) 600 MG 12 hr tablet Take 1 tablet (600 mg total) by mouth 2 (two) times daily. For cough.      Marland Kitchen lisinopril (PRINIVIL,ZESTRIL) 20 MG tablet Take 1 tablet (20 mg total) by mouth daily.  30 tablet  1  . metFORMIN (GLUCOPHAGE) 1000 MG tablet Take 1 tablet (1,000 mg total) by mouth 2 (two) times daily with a meal.      . metoprolol (LOPRESSOR) 50 MG tablet Take 1.5 tablets (75 mg total) by mouth 2 (two) times daily.  60 tablet  1  . niacin 500 MG tablet Take 500 mg by mouth daily with breakfast.        Vital Signs: BP 153/66, heart rate 92, respiration rate 20, O2 sat 90% on room air Physical Exam: Cardiovascular: Regular rate and rhythm; no murmurs, gallops, or rub. Pulmonary: Clear to auscultation bilaterally; no rales, wheezes, or rhonchi. Abdomen: Soft, nontender, bowel sounds present. Extremities: No cyanosis, clubbing, or edema. Wounds: Well healed and no signs of infection.  Diagnostic Tests: PA and lateral chest x-ray done today shows normal lung volumes, solution of previously seen small right apical pneumothorax, and no  pleural effusions  Impression and Plan: Overall, the patient is surgically stable. His systolic blood pressure is elevated upon this visit. I will not make any changes to his medications as he states he has an appointment to see Dr. Martinique in the morning. He takes an occasional narcotic when necessary for pain. He was instructed that providing is not taking any narcotics, he may begin driving short distances i.e. 30 minutes or less during the day. He may then gradually increase his frequency and duration as tolerates. He was encouraged to continue with sternal precautions i.e. no lifting more than 10 pounds for the next 3-4 weeks. He has been contacted by cardiac rehabilitation and I've instructed  and encouraged him to participate. He'll be seen on a when necessary basis by Dr. Prescott Gum and continue to be followed  by Dr. Martinique.

## 2012-01-06 ENCOUNTER — Ambulatory Visit (INDEPENDENT_AMBULATORY_CARE_PROVIDER_SITE_OTHER): Payer: Medicare Other | Admitting: Nurse Practitioner

## 2012-01-06 ENCOUNTER — Encounter: Payer: Self-pay | Admitting: Nurse Practitioner

## 2012-01-06 VITALS — BP 130/68 | HR 82 | Ht 71.0 in | Wt 184.0 lb

## 2012-01-06 DIAGNOSIS — I2510 Atherosclerotic heart disease of native coronary artery without angina pectoris: Secondary | ICD-10-CM

## 2012-01-06 DIAGNOSIS — Z951 Presence of aortocoronary bypass graft: Secondary | ICD-10-CM

## 2012-01-06 DIAGNOSIS — I471 Supraventricular tachycardia: Secondary | ICD-10-CM

## 2012-01-06 LAB — BASIC METABOLIC PANEL
BUN: 11 mg/dL (ref 6–23)
CO2: 20 mEq/L (ref 19–32)
Calcium: 9.6 mg/dL (ref 8.4–10.5)
Chloride: 106 mEq/L (ref 96–112)
Creatinine, Ser: 1.7 mg/dL — ABNORMAL HIGH (ref 0.4–1.5)
GFR: 42.5 mL/min — ABNORMAL LOW (ref >60.00)
Glucose, Bld: 66 mg/dL — ABNORMAL LOW (ref 70–99)
Potassium: 4.1 mEq/L (ref 3.5–5.1)
Sodium: 139 mEq/L (ref 135–145)

## 2012-01-06 LAB — CBC WITH DIFFERENTIAL/PLATELET
Basophils Absolute: 0 10*3/uL (ref 0.0–0.1)
Basophils Relative: 0.5 % (ref 0.0–3.0)
Eosinophils Absolute: 0.3 10*3/uL (ref 0.0–0.7)
Eosinophils Relative: 4.4 % (ref 0.0–5.0)
HCT: 31.7 % — ABNORMAL LOW (ref 39.0–52.0)
Hemoglobin: 10.5 g/dL — ABNORMAL LOW (ref 13.0–17.0)
Lymphocytes Relative: 10.2 % — ABNORMAL LOW (ref 12.0–46.0)
Lymphs Abs: 0.6 10*3/uL — ABNORMAL LOW (ref 0.7–4.0)
MCHC: 33.2 g/dL (ref 30.0–36.0)
MCV: 90 fl (ref 78.0–100.0)
Monocytes Absolute: 0.3 10*3/uL (ref 0.1–1.0)
Monocytes Relative: 5.6 % (ref 3.0–12.0)
Neutro Abs: 4.8 10*3/uL (ref 1.4–7.7)
Neutrophils Relative %: 79.3 % — ABNORMAL HIGH (ref 43.0–77.0)
Platelets: 185 10*3/uL (ref 150.0–400.0)
RBC: 3.53 Mil/uL — ABNORMAL LOW (ref 4.22–5.81)
RDW: 14 % (ref 11.5–14.6)
WBC: 6.1 10*3/uL (ref 4.5–10.5)

## 2012-01-06 MED ORDER — AMIODARONE HCL 400 MG PO TABS: 200.0000 mg | ORAL_TABLET | Freq: Every day | ORAL | Status: DC

## 2012-01-06 NOTE — Patient Instructions (Signed)
We are going to cut the amiodarone back to just 200 mg per day.  We are going to check some labs today.  I will see you back in about 3 weeks.  You may walk daily. Work up to 45 minutes per day. Ok to go to cardiac rehab.  Call the [facility] office at [phone] if you have any questions, problems or concerns.

## 2012-01-06 NOTE — Assessment & Plan Note (Signed)
Felt to have had some possible PAF post op. Has known PVC's. I have cut the amiodarone back to just 200 mg per day. Will see him back in 3 weeks. Hope to discontinue altogether at that visit. Patient is agreeable to this plan and will call if any problems develop in the interim.

## 2012-01-06 NOTE — Progress Notes (Signed)
Steve [name]. Date of Birth: 02/07/1941 Medical Record [id number]  History of Present Illness: Steve [name] is seen today for a post hospital visit. He is seen for Dr. Martinique. He has had recent CABG x 4 per Dr. Darcey Nora. He has done well. He did apparently have some PAF post op and is on amiodarone. He feels good. Says he feels the best he has in many years. Not short of breath. Not dizzy. No real soreness. No palpitations. Appetite is good. Bowels are ok. Seen by the PA at [facility] yesterday and was released.   Current Outpatient Prescriptions on File Prior to Visit  Medication Sig Dispense Refill  . Aromatic Inhalants (VICKS VAPOR INHALER IN) Inhale into the lungs daily as needed. For congestion      . cilostazol (PLETAL) 100 MG tablet Take 100 mg by mouth 2 (two) times daily.      . fenofibrate micronized (LOFIBRA) 200 MG capsule Take 200 mg by mouth daily before breakfast.        . glyBURIDE (DIABETA) 5 MG tablet Take 5 mg by mouth 2 (two) times daily with a meal.        . guaiFENesin (MUCINEX) 600 MG 12 hr tablet Take 1 tablet (600 mg total) by mouth 2 (two) times daily. For cough.      Marland Kitchen lisinopril (PRINIVIL,ZESTRIL) 20 MG tablet Take 1 tablet (20 mg total) by mouth daily.  30 tablet  1  . metFORMIN (GLUCOPHAGE) 1000 MG tablet Take 1 tablet (1,000 mg total) by mouth 2 (two) times daily with a meal.      . metoprolol (LOPRESSOR) 50 MG tablet Take 1.5 tablets (75 mg total) by mouth 2 (two) times daily.  60 tablet  1  . niacin 500 MG tablet Take 500 mg by mouth daily with breakfast.          No Known Allergies  Past Medical History  Diagnosis Date  . Diabetes mellitus     type 2  . Hypertension   . Peripheral arterial disease   . Hyperlipidemia   . CAD (coronary artery disease)     s/p CABG x 29 Nov 2011 with LIMA to LAD, SVG to OM1 and distal LCX, SVG to ramus intermediate  . PAF (paroxysmal atrial fibrillation)     post op atrial fib 11/2011    Past Surgical History  Procedure  Date  . Cholecystectomy 1999  . Cardiac catheterization 01/16/1999    Est. EF of 65% -- Nonobstruction atherosclerotic coronary artery disease -- Normal left ventricular function      . Cardiac electrophysiology study and ablation 05/21/1999    Normal sinus funtion -- Mildly prolonged interatrial conduction times -- Normal A-V node funtion -- Normal His Purkinje system function -- fNo accessory pathway -- No inducible ventricular tachycardia in the presence of the or in the absence of isoproterenol with programmed stimulation or with burst pacing -- Nikki Dom, M.D. FACC surgan          . Coronary artery bypass graft 12/10/2011    Procedure: CORONARY ARTERY BYPASS GRAFTING (CABG);  Surgeon: Tharon Aquas Adelene Idler, MD;  Location: [facility];  Service: Open Heart Surgery;  Laterality: N/A;  Coronary Artery bypass graft on pump times four utilizing left internal mammary artery and left saphenous vein harvested endoscopically    History  Smoking status  . Former Smoker -- 1.5 packs/day for 20 years  . Types: Cigarettes  . Quit date: 05/27/1979  Smokeless tobacco  .  Not on file    History  Alcohol Use No    Family History  Problem Relation Age of Onset  . Stroke Mother 60    Cerebellar hemorrhage    Review of Systems: The review of systems is per the HPI.  All other systems were reviewed and are negative.  Physical Exam: BP 130/68  Pulse 82  Ht 5\' 11"  (1.803 m)  Wt 184 lb (83.462 kg)  BMI 25.66 kg/m2 Patient is very pleasant and in no acute distress. Skin is warm and dry. Color is a little pale.  HEENT is unremarkable. Normocephalic/atraumatic. PERRL. Sclera are nonicteric. Neck is supple. No masses. No JVD. Lungs are clear. Cardiac exam shows a regular rate and rhythm. Abdomen is soft. Extremities are without edema. Gait and ROM are intact. No gross neurologic deficits noted.   LABORATORY DATA: EKG today shows sinus rhythm. PVC noted. Has T wave changes. Does have inferior Q's.    Dg Chest 2 View  01/05/2012  *RADIOLOGY REPORT*  Clinical Data: History of heart surgery in February 2013.  Follow- up 0.9.  CHEST - 2 VIEW  Comparison: Chest x-ray 12/14/2011.  Findings: Lung volumes are normal.  No consolidative airspace disease.  No pleural effusions.  No pneumothorax.  No pulmonary nodule or mass noted.  Pulmonary vasculature and the cardiomediastinal silhouette are within normal limits.  Status post median sternotomy for CABG with LIMA.  Atherosclerotic calcifications within the arch of the aorta.  Previously noted right apical pneumothorax is no longer visualized.  IMPRESSION: 1.  No radiographic evidence of acute cardiopulmonary disease. Previously noted right-sided hydropneumothorax no longer noted. 2.  Status post median sternotomy for CABG with a LIMA. 3.  Atherosclerosis.  Original Report Authenticated By: Etheleen Mayhew, M.D.   Assessment / Plan:

## 2012-01-06 NOTE — Assessment & Plan Note (Signed)
Patient is s/p CABG. He is doing very well. Has been released by CT surgery. CXR looks good. He is doing very well clinically. Will check labs today. Ok to go to cardiac rehab. We will see him back in 3 weeks. Patient is agreeable to this plan and will call if any problems develop in the interim.

## 2012-01-25 ENCOUNTER — Ambulatory Visit (INDEPENDENT_AMBULATORY_CARE_PROVIDER_SITE_OTHER): Payer: Medicare Other | Admitting: Nurse Practitioner

## 2012-01-25 ENCOUNTER — Other Ambulatory Visit: Payer: Self-pay | Admitting: *Deleted

## 2012-01-25 ENCOUNTER — Encounter: Payer: Self-pay | Admitting: Nurse Practitioner

## 2012-01-25 VITALS — BP 112/70 | HR 92 | Ht 71.0 in | Wt 183.0 lb

## 2012-01-25 DIAGNOSIS — Z951 Presence of aortocoronary bypass graft: Secondary | ICD-10-CM

## 2012-01-25 DIAGNOSIS — E785 Hyperlipidemia, unspecified: Secondary | ICD-10-CM

## 2012-01-25 DIAGNOSIS — I471 Supraventricular tachycardia, unspecified: Secondary | ICD-10-CM

## 2012-01-25 DIAGNOSIS — I2510 Atherosclerotic heart disease of native coronary artery without angina pectoris: Secondary | ICD-10-CM

## 2012-01-25 MED ORDER — METOPROLOL TARTRATE 50 MG PO TABS: 75.0000 mg | ORAL_TABLET | Freq: Two times a day (BID) | ORAL | Status: DC

## 2012-01-25 NOTE — Assessment & Plan Note (Signed)
Steve [name] looks good. He is felt to be doing well. He will be starting cardiac rehab later this week. We will see him back in about 4 to 6 weeks with fasting labs. His amiodarone is stopped today. I have refilled his Metoprolol. No other medicine changes today. Patient is agreeable to this plan and will call if any problems develop in the interim.

## 2012-01-25 NOTE — Assessment & Plan Note (Signed)
Had post op atrial fib. Remains in sinus rhythm. Amiodarone is stopped today. He will continue on his beta blocker therapy.

## 2012-01-25 NOTE — Patient Instructions (Signed)
I think you are doing well.  Ok to start cardiac rehab later this week.  Stop your amiodarone.  I have refilled your metoprolol  We will see you back in a month with fasting labs with Dr. Martinique.

## 2012-01-25 NOTE — Progress Notes (Signed)
Steve [name]. Date of Birth: 1940/11/20 Medical Record [id number]  History of Present Illness: Steve [name] is seen today for a 2 week follow up visit. He is seen for Dr. Martinique. He has had CABG x 4 in February. He did have some post op atrial fib. He is on low dose amiodarone. 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   He comes in today. He is here alone. He is doing quite well. He will be starting cardiac rehab later this week. No pain, shortness of breath or dizziness reported. Does get some occasional burning in his feet. Blood sugars have improved with a change in his diabetic medicines. Overall, he thinks he is doing quite well.   Current Outpatient Prescriptions on File Prior to Visit  Medication Sig Dispense Refill  . Aromatic Inhalants (VICKS VAPOR INHALER IN) Inhale into the lungs daily as needed. For congestion      . cilostazol (PLETAL) 100 MG tablet Take 100 mg by mouth 2 (two) times daily.      . fenofibrate micronized (LOFIBRA) 200 MG capsule Take 200 mg by mouth daily before breakfast.        . glyBURIDE (DIABETA) 5 MG tablet Take 5 mg by mouth 2 (two) times daily with a meal.        . lisinopril (PRINIVIL,ZESTRIL) 20 MG tablet Take 1 tablet (20 mg total) by mouth daily.  30 tablet  1  . metFORMIN (GLUCOPHAGE) 1000 MG tablet Take 1 tablet (1,000 mg total) by mouth 2 (two) times daily with a meal.      . niacin 500 MG tablet Take 500 mg by mouth daily with breakfast.        . DISCONTD: metoprolol (LOPRESSOR) 50 MG tablet Take 1.5 tablets (75 mg total) by mouth 2 (two) times daily.  60 tablet  1  . DISCONTD: furosemide (LASIX) 40 MG tablet Take 1 tablet (40 mg total) by mouth daily. For 4 days then stop.  4 tablet  0  . DISCONTD: potassium chloride SA (K-DUR,KLOR-CON) 20 MEQ tablet Take 1 tablet (20 mEq total) by mouth daily. For 4 days the stop.  4 tablet  0    No Known Allergies  Past Medical History  Diagnosis Date  . Diabetes mellitus     type 2  .  Hypertension   . Peripheral arterial disease   . Hyperlipidemia   . CAD (coronary artery disease)     s/p CABG x 29 Nov 2011 with LIMA to LAD, SVG to OM1 and distal LCX, SVG to ramus intermediate  . PAF (paroxysmal atrial fibrillation)     post op atrial fib 11/2011; short course of amiodarone; stopped 01/25/12  . Left bundle branch block   . Right bundle branch block     Past Surgical History  Procedure Date  . Cholecystectomy 1999  . Cardiac catheterization 01/16/1999    Est. EF of 65% -- Nonobstruction atherosclerotic coronary artery disease -- Normal left ventricular function      . Cardiac electrophysiology study and ablation 05/21/1999    Normal sinus funtion -- Mildly prolonged interatrial conduction times -- Normal A-V node funtion -- Normal His Purkinje system function -- fNo accessory pathway -- No inducible ventricular tachycardia in the presence of the or in the absence of isoproterenol with programmed stimulation or with burst pacing -- Nikki Dom, M.D. FACC surgan          . Coronary artery bypass graft 12/10/2011  Procedure: CORONARY ARTERY BYPASS GRAFTING (CABG);  Surgeon: Tharon Aquas Adelene Idler, MD;  Location: [place];  Service: Open Heart Surgery;  Laterality: N/A;  Coronary Artery bypass graft on pump times four utilizing left internal mammary artery and left saphenous vein harvested endoscopically    History  Smoking status  . Former Smoker -- 1.5 packs/day for 20 years  . Types: Cigarettes  . Quit date: 05/27/1979  Smokeless tobacco  . Not on file    History  Alcohol Use No    Family History  Problem Relation Age of Onset  . Stroke Mother 52    Cerebellar hemorrhage    Review of Systems: The review of systems is positive for burning in his feet. He has no cardiac complaints.  All other systems were reviewed and are negative.  Physical Exam: BP 112/70  Pulse 92  Ht 5\' 11"  (1.803 m)  Wt 183 lb (83.008 kg)  BMI 25.52 kg/m2 Patient is very pleasant  and in no acute distress. Skin is warm and dry. Color is normal.  HEENT is unremarkable. Normocephalic/atraumatic. PERRL. Sclera are nonicteric. Neck is supple. No masses. No JVD. Lungs are clear. Cardiac exam shows a regular rate and rhythm. Sternum looks good. Abdomen is soft. Extremities are without edema. Gait and ROM are intact. No gross neurologic deficits noted.  LABORATORY DATA: EKG today shows sinus rhythm. Left and right BBB noted. Tracing reviewed with Dr. Martinique.   Lab Results  Component Value Date   WBC 6.1 01/06/2012   HGB 10.5* 01/06/2012   HCT 31.7* 01/06/2012   PLT 185.0 01/06/2012   GLUCOSE 66* 01/06/2012   ALT 22 01/15/2008   AST 40* 01/15/2008   NA 139 01/06/2012   K 4.1 01/06/2012   CL 106 01/06/2012   CREATININE 1.7* 01/06/2012   BUN 11 01/06/2012   CO2 20 01/06/2012   INR 1.45 12/10/2011   HGBA1C 7.0* 12/09/2011     Assessment / Plan:

## 2012-01-27 ENCOUNTER — Encounter (HOSPITAL_COMMUNITY)
Admission: RE | Admit: 2012-01-27 | Discharge: 2012-01-27 | Disposition: A | Discharge status: Home / Self Care | Payer: Medicare Other | Source: Ambulatory Visit | Attending: Cardiology | Admitting: Cardiology

## 2012-01-27 DIAGNOSIS — I471 Supraventricular tachycardia, unspecified: Secondary | ICD-10-CM | POA: Insufficient documentation

## 2012-01-27 DIAGNOSIS — I2510 Atherosclerotic heart disease of native coronary artery without angina pectoris: Secondary | ICD-10-CM | POA: Insufficient documentation

## 2012-01-27 DIAGNOSIS — I200 Unstable angina: Secondary | ICD-10-CM | POA: Insufficient documentation

## 2012-01-27 DIAGNOSIS — I10 Essential (primary) hypertension: Secondary | ICD-10-CM | POA: Insufficient documentation

## 2012-01-27 DIAGNOSIS — Z87891 Personal history of nicotine dependence: Secondary | ICD-10-CM | POA: Insufficient documentation

## 2012-01-27 DIAGNOSIS — I4949 Other premature depolarization: Secondary | ICD-10-CM | POA: Insufficient documentation

## 2012-01-27 DIAGNOSIS — E785 Hyperlipidemia, unspecified: Secondary | ICD-10-CM | POA: Insufficient documentation

## 2012-01-27 DIAGNOSIS — I4891 Unspecified atrial fibrillation: Secondary | ICD-10-CM | POA: Insufficient documentation

## 2012-01-27 DIAGNOSIS — E119 Type 2 diabetes mellitus without complications: Secondary | ICD-10-CM | POA: Insufficient documentation

## 2012-01-27 DIAGNOSIS — Z951 Presence of aortocoronary bypass graft: Secondary | ICD-10-CM | POA: Insufficient documentation

## 2012-01-27 DIAGNOSIS — Z5189 Encounter for other specified aftercare: Secondary | ICD-10-CM | POA: Insufficient documentation

## 2012-01-27 NOTE — Progress Notes (Signed)
Cardiac Rehab Medication Review by a Pharmacist  Does the patient  feel that his/her medications are working for him/her?  yes  Has the patient been experiencing any side effects to the medications prescribed?  no  Does the patient measure his/her own blood pressure or blood glucose at home?  yes   Does the patient have any problems obtaining medications due to transportation or finances?   no  Understanding of regimen: good Understanding of indications: good Potential of compliance: good    Pharmacist comments:     Leland Her 01/27/2012 8:07 AM

## 2012-01-31 ENCOUNTER — Encounter (HOSPITAL_COMMUNITY)
Admission: RE | Admit: 2012-01-31 | Discharge: 2012-01-31 | Disposition: A | Discharge status: Home / Self Care | Payer: Medicare Other | Source: Ambulatory Visit | Attending: Cardiology | Admitting: Cardiology

## 2012-01-31 ENCOUNTER — Encounter (HOSPITAL_COMMUNITY): Payer: Self-pay

## 2012-01-31 LAB — GLUCOSE, CAPILLARY
Glucose-Capillary: 74 mg/dL (ref 70–99)
Glucose-Capillary: 124 mg/dL — ABNORMAL HIGH (ref 70–99)
Glucose-Capillary: 68 mg/dL — ABNORMAL LOW (ref 70–99)

## 2012-01-31 NOTE — Progress Notes (Addendum)
Pt CBG-74 upon arrival at cardiac rehab.  Pt given lemonade and graham crackers. Recheck cbg-68 fifteen minutes later.  Pt given 15gm of glucose gel. CBG- 124 twenty minutes later. Pt did not exercise today.   Pt had RD consult with Derek Mound, RD, Diabetic Coordinator.  Per Parke Simmers, pt instructed to add protein to lunch, as pt ate 2 tangerines for lunch today.  Pt also advised per Parke Simmers to hold glyburide qAM and check CBG BID.  Understanding verbalized-jrion,rn

## 2012-02-02 ENCOUNTER — Encounter (HOSPITAL_COMMUNITY)
Admission: RE | Admit: 2012-02-02 | Discharge: 2012-02-02 | Disposition: A | Discharge status: Home / Self Care | Payer: Medicare Other | Source: Ambulatory Visit | Attending: Cardiology | Admitting: Cardiology

## 2012-02-02 LAB — GLUCOSE, CAPILLARY
Glucose-Capillary: 83 mg/dL (ref 70–99)
Glucose-Capillary: 101 mg/dL — ABNORMAL HIGH (ref 70–99)

## 2012-02-04 ENCOUNTER — Encounter (HOSPITAL_COMMUNITY)
Admission: RE | Admit: 2012-02-04 | Discharge: 2012-02-04 | Disposition: A | Discharge status: Home / Self Care | Payer: Medicare Other | Source: Ambulatory Visit | Attending: Cardiology | Admitting: Cardiology

## 2012-02-06 ENCOUNTER — Other Ambulatory Visit: Payer: Self-pay | Admitting: Physician Assistant

## 2012-02-07 ENCOUNTER — Encounter (HOSPITAL_COMMUNITY)
Admission: RE | Admit: 2012-02-07 | Discharge: 2012-02-07 | Disposition: A | Discharge status: Home / Self Care | Payer: Medicare Other | Source: Ambulatory Visit | Attending: Cardiology | Admitting: Cardiology

## 2012-02-07 LAB — GLUCOSE, CAPILLARY
Glucose-Capillary: 89 mg/dL (ref 70–99)
Glucose-Capillary: 92 mg/dL (ref 70–99)

## 2012-02-07 NOTE — Progress Notes (Signed)
Nutrition Note Consult re: labile CBG's/ frequent low pre-exercise CBG's. Spoke with pt. Pt ate an apple turnover and an apple fritter for breakfast at 8 am. Pt did not check CBG prior to breakfast. Before lunch CBG 313 mg/dL, so pt states he "ate a tangerine and took the supplement Chromium." Pt has been holding Glyburide in the morning and at night despite being encouraged to hold only the am Glyburide.  Pre-exercise CBG 86 mg/dL. Pt again encouraged to decrease simple sugars in his diet and add protein to lunch (e.g. Peanut butter crackers and milk). Pt reports he eats an apple fritter "daily, but I don't have to." Pt eats egg substitute, sausage, juice, and coffee 3 mornings a week. Pt states he will try his "egg breakfast" before exercise. Continue client-centered nutrition education by RD as part of interdisciplinary care.  Monitor and evaluate progress toward nutrition goal with team.

## 2012-02-09 ENCOUNTER — Encounter (HOSPITAL_COMMUNITY)
Admission: RE | Admit: 2012-02-09 | Discharge: 2012-02-09 | Disposition: A | Discharge status: Home / Self Care | Payer: Medicare Other | Source: Ambulatory Visit | Attending: Cardiology | Admitting: Cardiology

## 2012-02-09 LAB — GLUCOSE, CAPILLARY
Glucose-Capillary: 171 mg/dL — ABNORMAL HIGH (ref 70–99)
Glucose-Capillary: 93 mg/dL (ref 70–99)

## 2012-02-11 ENCOUNTER — Encounter (HOSPITAL_COMMUNITY): Payer: Medicare Other

## 2012-02-14 ENCOUNTER — Encounter (HOSPITAL_COMMUNITY): Payer: Medicare Other

## 2012-02-14 NOTE — Progress Notes (Addendum)
                            Cardiac Rehabilitation Program Outcomes Report   Orientation:  01/27/2012 Graduate Date:   Discharge Date:  02/11/2012 # of sessions completed: 3  Cardiologist: Martinique Family MD:  Donnella Bi Time:  [id number]  A.  Exercise Program:  Tolerates exercise @ 2.0 METS for 30 minutes and Discharged  B.  Mental Health:    C.  Education/Instruction/Skills  Uses Perceived Exertion Scale and Attended 0 education classes    D.  Nutrition/Weight Control/Body Composition:  Patient has gained 1.6 kg BMI 26.8, Pt CBG's frequently too low for exercise. Pt consumes a significant amount of simple CHO. Pt needs continued encouragement to eat a more healthful diet. 02/16/12 1542 Derek Mound, Georgiann Hahn, RD, LDN, CDE E.  Blood Lipids  No results found for this basename: CHOL, HDL, LDLCALC, LDLDIRECT, TRIG, CHOLHDL  F.  Lifestyle Changes:   G.  Symptoms noted with exercise:  Hypoglycemic  Report Completed By:  Electronically signed by Thea Silversmith MS on Monday February 14 2012 at [place]  Comments:         Pt exited cardiac rehab program after 1 week of participation due to family obligations.  Pt states he plans to exercise on his own at home.  VSS, Telemetry-NSR, glucose low normal, often too low to exercise.  Pt will need MD encourgement to make positive lifestyle changes.

## 2012-02-16 ENCOUNTER — Encounter (HOSPITAL_COMMUNITY): Payer: Medicare Other

## 2012-02-18 ENCOUNTER — Encounter (HOSPITAL_COMMUNITY): Payer: Medicare Other

## 2012-02-21 ENCOUNTER — Encounter (HOSPITAL_COMMUNITY): Payer: Medicare Other

## 2012-02-23 ENCOUNTER — Encounter (HOSPITAL_COMMUNITY): Payer: Medicare Other

## 2012-02-25 ENCOUNTER — Ambulatory Visit (INDEPENDENT_AMBULATORY_CARE_PROVIDER_SITE_OTHER): Payer: Medicare Other | Admitting: Nurse Practitioner

## 2012-02-25 ENCOUNTER — Encounter (HOSPITAL_COMMUNITY): Payer: Medicare Other

## 2012-02-25 ENCOUNTER — Encounter: Payer: Self-pay | Admitting: Nurse Practitioner

## 2012-02-25 ENCOUNTER — Other Ambulatory Visit: Payer: Medicare Other

## 2012-02-25 VITALS — BP 132/70 | HR 74 | Ht 71.0 in | Wt 185.0 lb

## 2012-02-25 DIAGNOSIS — Z951 Presence of aortocoronary bypass graft: Secondary | ICD-10-CM

## 2012-02-25 DIAGNOSIS — E785 Hyperlipidemia, unspecified: Secondary | ICD-10-CM

## 2012-02-25 DIAGNOSIS — I2510 Atherosclerotic heart disease of native coronary artery without angina pectoris: Secondary | ICD-10-CM

## 2012-02-25 DIAGNOSIS — I10 Essential (primary) hypertension: Secondary | ICD-10-CM

## 2012-02-25 DIAGNOSIS — E119 Type 2 diabetes mellitus without complications: Secondary | ICD-10-CM

## 2012-02-25 LAB — LIPID PANEL
Cholesterol: 169 mg/dL (ref 0–200)
HDL: 32.6 mg/dL — ABNORMAL LOW (ref >39.00)
Total CHOL/HDL Ratio: 5
Triglycerides: 249 mg/dL — ABNORMAL HIGH (ref 0.0–149.0)
VLDL: 49.8 mg/dL — ABNORMAL HIGH (ref 0.0–40.0)

## 2012-02-25 LAB — CBC WITH DIFFERENTIAL/PLATELET
Basophils Absolute: 0 10*3/uL (ref 0.0–0.1)
Basophils Relative: 0.5 % (ref 0.0–3.0)
Eosinophils Absolute: 0.1 10*3/uL (ref 0.0–0.7)
Eosinophils Relative: 3.1 % (ref 0.0–5.0)
HCT: 34.5 % — ABNORMAL LOW (ref 39.0–52.0)
Hemoglobin: 11.6 g/dL — ABNORMAL LOW (ref 13.0–17.0)
Lymphocytes Relative: 15.1 % (ref 12.0–46.0)
Lymphs Abs: 0.7 10*3/uL (ref 0.7–4.0)
MCHC: 33.5 g/dL (ref 30.0–36.0)
MCV: 88.2 fl (ref 78.0–100.0)
Monocytes Absolute: 0.3 10*3/uL (ref 0.1–1.0)
Monocytes Relative: 6.2 % (ref 3.0–12.0)
Neutro Abs: 3.4 10*3/uL (ref 1.4–7.7)
Neutrophils Relative %: 75.1 % (ref 43.0–77.0)
Platelets: 154 10*3/uL (ref 150.0–400.0)
RBC: 3.91 Mil/uL — ABNORMAL LOW (ref 4.22–5.81)
RDW: 16.2 % — ABNORMAL HIGH (ref 11.5–14.6)
WBC: 4.6 10*3/uL (ref 4.5–10.5)

## 2012-02-25 LAB — HEPATIC FUNCTION PANEL
ALT: 29 U/L (ref 0–53)
AST: 50 U/L — ABNORMAL HIGH (ref 0–37)
Albumin: 4 g/dL (ref 3.5–5.2)
Alkaline Phosphatase: 31 U/L — ABNORMAL LOW (ref 39–117)
Bilirubin, Direct: 0.1 mg/dL (ref 0.0–0.3)
Total Bilirubin: 0.8 mg/dL (ref 0.3–1.2)
Total Protein: 6.7 g/dL (ref 6.0–8.3)

## 2012-02-25 LAB — BASIC METABOLIC PANEL
BUN: 11 mg/dL (ref 6–23)
CO2: 24 mEq/L (ref 19–32)
Calcium: 9.1 mg/dL (ref 8.4–10.5)
Chloride: 105 mEq/L (ref 96–112)
Creatinine, Ser: 1.3 mg/dL (ref 0.4–1.5)
GFR: 58.55 mL/min — ABNORMAL LOW (ref >60.00)
Glucose, Bld: 129 mg/dL — ABNORMAL HIGH (ref 70–99)
Potassium: 4.4 mEq/L (ref 3.5–5.1)
Sodium: 137 mEq/L (ref 135–145)

## 2012-02-25 LAB — LDL CHOLESTEROL, DIRECT: Direct LDL: 112.4 mg/dL

## 2012-02-25 NOTE — Progress Notes (Signed)
Steve [name]. Date of Birth: 1940/10/31 Medical Record [id number]  History of Present Illness: Steve [name] is seen today for a one month check. He is seen for Dr. Martinique. He had CABG x 4 in February. Has a normal EF. Has done well. Now off of amiodarone. Other problems include HTN, DM, and HLD. He does have some neuropathy of his feet.  He comes in today. He is here alone. He is doing quite well. Feels good. Had to stop cardiac rehab due to too many personal issues. Trying to walk some at home. No chest pain. Not short of breath. Blood sugars have been running low and he has been holding his glyburide. He is going to follow up with Dr. Arelia Sneddon. He is fasting today and needs labs. He is tolerating his medicines and overall, has no complaint.   Current Outpatient Prescriptions on File Prior to Visit  Medication Sig Dispense Refill  . Aromatic Inhalants (VICKS VAPOR INHALER IN) Inhale into the lungs daily as needed. For congestion      . aspirin 325 MG tablet Take 325 mg by mouth daily.      . cilostazol (PLETAL) 100 MG tablet Take 100 mg by mouth 2 (two) times daily.      . fenofibrate micronized (LOFIBRA) 200 MG capsule Take 200 mg by mouth daily before breakfast.        . Inositol Niacinate (NIACIN FLUSH FREE) 500 MG CAPS Take 500 mg by mouth daily.      Marland Kitchen lisinopril (PRINIVIL,ZESTRIL) 20 MG tablet TAKE 1 TABLET BY MOUTH EVERY DAY  30 tablet  1  . metFORMIN (GLUCOPHAGE) 1000 MG tablet Take 1 tablet (1,000 mg total) by mouth 2 (two) times daily with a meal.      . metoprolol (LOPRESSOR) 50 MG tablet Take 1.5 tablets (75 mg total) by mouth 2 (two) times daily.  270 tablet  3  . glyBURIDE (DIABETA) 5 MG tablet Take 5 mg by mouth 2 (two) times daily with a meal.        . DISCONTD: furosemide (LASIX) 40 MG tablet Take 1 tablet (40 mg total) by mouth daily. For 4 days then stop.  4 tablet  0  . DISCONTD: potassium chloride SA (K-DUR,KLOR-CON) 20 MEQ tablet Take 1 tablet (20 mEq total) by mouth daily.  For 4 days the stop.  4 tablet  0    No Known Allergies  Past Medical History  Diagnosis Date  . Diabetes mellitus     type 2  . Hypertension   . Peripheral arterial disease   . Hyperlipidemia   . CAD (coronary artery disease)     s/p CABG x 29 Nov 2011 with LIMA to LAD, SVG to OM1 and distal LCX, SVG to ramus intermediate  . PAF (paroxysmal atrial fibrillation)     post op atrial fib 11/2011; short course of amiodarone; stopped 01/25/12  . Left bundle branch block   . Right bundle branch block     Past Surgical History  Procedure Date  . Cholecystectomy 1999  . Cardiac catheterization 01/16/1999    Est. EF of 65% -- Nonobstruction atherosclerotic coronary artery disease -- Normal left ventricular function      . Cardiac electrophysiology study and ablation 05/21/1999    Normal sinus funtion -- Mildly prolonged interatrial conduction times -- Normal A-V node funtion -- Normal His Purkinje system function -- fNo accessory pathway -- No inducible ventricular tachycardia in the presence of the or in the absence  of isoproterenol with programmed stimulation or with burst pacing -- Nikki Dom, M.D. FACC surgan          . Coronary artery bypass graft 12/10/2011    Procedure: CORONARY ARTERY BYPASS GRAFTING (CABG);  Surgeon: Tharon Aquas Adelene Idler, MD;  Location: [facility];  Service: Open Heart Surgery;  Laterality: N/A;  Coronary Artery bypass graft on pump times four utilizing left internal mammary artery and left saphenous vein harvested endoscopically    History  Smoking status  . Former Smoker -- 1.5 packs/day for 20 years  . Types: Cigarettes  . Quit date: 05/27/1979  Smokeless tobacco  . Former User    History  Alcohol Use No    Family History  Problem Relation Age of Onset  . 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  All other systems were reviewed and are negative.  Physical  Exam: BP 132/70  Pulse 74  Ht 5\' 11"  (1.803 m)  Wt 185 lb (83.915 kg)  BMI 25.80 kg/m2 Patient is very pleasant and in no acute distress. Skin is warm and dry. Color is normal.  HEENT is unremarkable. Normocephalic/atraumatic. PERRL. Sclera are nonicteric. Neck is supple. No masses. No JVD. Lungs are clear. Cardiac exam shows a regular rate and rhythm. Abdomen is soft. Extremities are without edema. Gait and ROM are intact. No gross neurologic deficits noted.  LABORATORY DATA: PENDING  Assessment / Plan:

## 2012-02-25 NOTE — Patient Instructions (Signed)
I think you are doing well.  Stay on your current medicines.  We will check your labs today  I will have you see Dr. Martinique in 4 months.  Call the [facility] office at [phone] if you have any questions, problems or concerns.

## 2012-02-25 NOTE — Assessment & Plan Note (Signed)
Blood pressure looks good. No change with his medicines.

## 2012-02-25 NOTE — Assessment & Plan Note (Signed)
He has been holding his glyburide because of low blood sugars. He is planning on following up with Dr. Arelia Sneddon.

## 2012-02-25 NOTE — Assessment & Plan Note (Signed)
He is doing well post op. I encouraged him to stay active. No change in his medicines. We are check fasting labs today. Will see him back in 4 months.

## 2012-02-28 ENCOUNTER — Encounter (HOSPITAL_COMMUNITY): Payer: Medicare Other

## 2012-03-01 ENCOUNTER — Encounter (HOSPITAL_COMMUNITY): Payer: Medicare Other

## 2012-03-03 ENCOUNTER — Encounter (HOSPITAL_COMMUNITY): Payer: Medicare Other

## 2012-03-06 ENCOUNTER — Encounter (HOSPITAL_COMMUNITY): Payer: Medicare Other

## 2012-03-08 ENCOUNTER — Encounter (HOSPITAL_COMMUNITY): Payer: Medicare Other

## 2012-03-10 ENCOUNTER — Encounter (HOSPITAL_COMMUNITY): Payer: Medicare Other

## 2012-03-13 ENCOUNTER — Encounter (HOSPITAL_COMMUNITY): Payer: Medicare Other

## 2012-03-15 ENCOUNTER — Encounter (HOSPITAL_COMMUNITY): Payer: Medicare Other

## 2012-03-17 ENCOUNTER — Encounter (HOSPITAL_COMMUNITY): Payer: Medicare Other

## 2012-03-20 ENCOUNTER — Encounter (HOSPITAL_COMMUNITY): Payer: Medicare Other

## 2012-03-22 ENCOUNTER — Encounter (HOSPITAL_COMMUNITY): Payer: Medicare Other

## 2012-03-24 ENCOUNTER — Encounter (HOSPITAL_COMMUNITY): Payer: Medicare Other

## 2012-03-27 ENCOUNTER — Encounter (HOSPITAL_COMMUNITY): Payer: Medicare Other

## 2012-03-29 ENCOUNTER — Encounter (HOSPITAL_COMMUNITY): Payer: Medicare Other

## 2012-03-31 ENCOUNTER — Encounter (HOSPITAL_COMMUNITY): Payer: Medicare Other

## 2012-04-03 ENCOUNTER — Encounter (HOSPITAL_COMMUNITY): Payer: Medicare Other

## 2012-04-05 ENCOUNTER — Other Ambulatory Visit: Payer: Self-pay | Admitting: Cardiothoracic Surgery

## 2012-04-05 ENCOUNTER — Encounter (HOSPITAL_COMMUNITY): Payer: Medicare Other

## 2012-04-07 ENCOUNTER — Encounter (HOSPITAL_COMMUNITY): Payer: Medicare Other

## 2012-04-08 ENCOUNTER — Other Ambulatory Visit: Payer: Self-pay | Admitting: Cardiothoracic Surgery

## 2012-04-10 ENCOUNTER — Other Ambulatory Visit: Payer: Self-pay | Admitting: Cardiology

## 2012-04-10 ENCOUNTER — Encounter (HOSPITAL_COMMUNITY): Payer: Medicare Other

## 2012-04-12 ENCOUNTER — Encounter (HOSPITAL_COMMUNITY): Payer: Medicare Other

## 2012-04-14 ENCOUNTER — Encounter (HOSPITAL_COMMUNITY): Payer: Medicare Other

## 2012-04-17 ENCOUNTER — Encounter (HOSPITAL_COMMUNITY): Payer: Medicare Other

## 2012-04-19 ENCOUNTER — Encounter (HOSPITAL_COMMUNITY): Payer: Medicare Other

## 2012-04-21 ENCOUNTER — Encounter (HOSPITAL_COMMUNITY): Payer: Medicare Other

## 2012-04-24 ENCOUNTER — Encounter (HOSPITAL_COMMUNITY): Payer: Medicare Other

## 2012-04-26 ENCOUNTER — Encounter (HOSPITAL_COMMUNITY): Payer: Medicare Other

## 2012-04-28 ENCOUNTER — Encounter (HOSPITAL_COMMUNITY): Payer: Medicare Other

## 2012-05-01 ENCOUNTER — Encounter (HOSPITAL_COMMUNITY): Payer: Medicare Other

## 2012-05-03 ENCOUNTER — Encounter (HOSPITAL_COMMUNITY): Payer: Medicare Other

## 2012-05-05 ENCOUNTER — Encounter (HOSPITAL_COMMUNITY): Payer: Medicare Other

## 2012-05-16 ENCOUNTER — Other Ambulatory Visit: Payer: Self-pay | Admitting: *Deleted

## 2012-05-16 MED ORDER — CILOSTAZOL 100 MG PO TABS: 100.0000 mg | ORAL_TABLET | Freq: Two times a day (BID) | ORAL | Status: DC

## 2012-06-28 ENCOUNTER — Ambulatory Visit (INDEPENDENT_AMBULATORY_CARE_PROVIDER_SITE_OTHER): Payer: Medicare Other | Admitting: Cardiology

## 2012-06-28 ENCOUNTER — Encounter: Payer: Self-pay | Admitting: Cardiology

## 2012-06-28 VITALS — BP 154/78 | HR 88 | Ht 69.5 in | Wt 189.0 lb

## 2012-06-28 DIAGNOSIS — E119 Type 2 diabetes mellitus without complications: Secondary | ICD-10-CM

## 2012-06-28 DIAGNOSIS — I25810 Atherosclerosis of coronary artery bypass graft(s) without angina pectoris: Secondary | ICD-10-CM

## 2012-06-28 DIAGNOSIS — E785 Hyperlipidemia, unspecified: Secondary | ICD-10-CM

## 2012-06-28 DIAGNOSIS — I10 Essential (primary) hypertension: Secondary | ICD-10-CM

## 2012-06-28 MED ORDER — LISINOPRIL 20 MG PO TABS: 20.0000 mg | ORAL_TABLET | Freq: Two times a day (BID) | ORAL | Status: DC

## 2012-06-28 NOTE — Patient Instructions (Signed)
Increase lisinopril to 20 mg twice a day.  Continue your other medication.  I will see you again in 6 months.

## 2012-06-28 NOTE — Progress Notes (Signed)
Steve [name]. Date of Birth: Apr 09, 1941 Medical Record [id number]  History of Present Illness: Steve [name] is seen today for followup. He had CABG x 4 in February.  EF was normal.  Other problems include HTN, DM, and HLD. He does have some neuropathy of his feet. On followup today he reports he is feeling very well. The pain in his feet still limits his walking. He still has a little bit of soreness in the left parasternal region from his surgery. He denies any anginal symptoms or shortness of breath. He reports his last A1c was 5.2%. He does note that his blood pressure has been higher over the past month.   Current Outpatient Prescriptions on File Prior to Visit  Medication Sig Dispense Refill  . aspirin 325 MG tablet Take 325 mg by mouth daily.      . cilostazol (PLETAL) 100 MG tablet Take 1 tablet (100 mg total) by mouth 2 (two) times daily.  60 tablet  9  . fenofibrate micronized (LOFIBRA) 200 MG capsule Take 200 mg by mouth daily before breakfast.        . Inositol Niacinate (NIACIN FLUSH FREE) 500 MG CAPS Take 500 mg by mouth daily.      . metFORMIN (GLUCOPHAGE) 1000 MG tablet Take 1 tablet (1,000 mg total) by mouth 2 (two) times daily with a meal.      . metoprolol (LOPRESSOR) 50 MG tablet Take 1.5 tablets (75 mg total) by mouth 2 (two) times daily.  270 tablet  3  . DISCONTD: lisinopril (PRINIVIL,ZESTRIL) 20 MG tablet TAKE 1 TABLET BY MOUTH EVERY DAY  30 tablet  5  . DISCONTD: furosemide (LASIX) 40 MG tablet Take 1 tablet (40 mg total) by mouth daily. For 4 days then stop.  4 tablet  0  . DISCONTD: potassium chloride SA (K-DUR,KLOR-CON) 20 MEQ tablet Take 1 tablet (20 mEq total) by mouth daily. For 4 days the stop.  4 tablet  0    No Known Allergies  Past Medical History  Diagnosis Date  . Diabetes mellitus     type 2  . Hypertension   . Peripheral arterial disease   . Hyperlipidemia   . CAD (coronary artery disease)     s/p CABG x 29 Nov 2011 with LIMA to LAD, SVG to OM1 and  distal LCX, SVG to ramus intermediate  . PAF (paroxysmal atrial fibrillation)     post op atrial fib 11/2011; short course of amiodarone; stopped 01/25/12  . Left bundle branch block   . Right bundle branch block     Past Surgical History  Procedure Date  . Cholecystectomy 1999  . Cardiac catheterization 01/16/1999    Est. EF of 65% -- Nonobstruction atherosclerotic coronary artery disease -- Normal left ventricular function      . Cardiac electrophysiology study and ablation 05/21/1999    Normal sinus funtion -- Mildly prolonged interatrial conduction times -- Normal A-V node funtion -- Normal His Purkinje system function -- fNo accessory pathway -- No inducible ventricular tachycardia in the presence of the or in the absence of isoproterenol with programmed stimulation or with burst pacing -- Nikki Dom, M.D. FACC surgan          . Coronary artery bypass graft 12/10/2011    Procedure: CORONARY ARTERY BYPASS GRAFTING (CABG);  Surgeon: Tharon Aquas Adelene Idler, MD;  Location: [facility];  Service: Open Heart Surgery;  Laterality: N/A;  Coronary Artery bypass graft on pump times four utilizing  left internal mammary artery and left saphenous vein harvested endoscopically    History  Smoking status  . Former Smoker -- 1.5 packs/day for 20 years  . Types: Cigarettes  . Quit date: 05/27/1979  Smokeless tobacco  . Former User    History  Alcohol Use No    Family History  Problem Relation Age of Onset  . 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  All other systems were reviewed and are negative.  Physical Exam: BP 154/78  Pulse 88  Ht 5' 9.5" (1.765 m)  Wt 85.73 kg (189 lb)  BMI 27.51 kg/m2  SpO2 99% Patient is very pleasant and in no acute distress. Skin is warm and dry. Color is normal.  HEENT is unremarkable. Normocephalic/atraumatic. PERRL. Sclera are nonicteric. Neck is supple. No masses. No  JVD. Lungs are clear. Cardiac exam shows a regular rate and rhythm. Abdomen is soft. Extremities are without edema. Gait and ROM are intact. No gross neurologic deficits noted.  LABORATORY DATA:   Assessment / Plan: 1. Coronary disease status post CABG. He has made an excellent recovery. He has no restrictions at this time. We will continue with risk factor modification and followup in 6 months.  2. Diabetes mellitus type 2, controlled.  3. Hypertension, poorly controlled. I recommended increasing his lisinopril to 20 mg twice a day. Continue metoprolol.  4. Hyperlipidemia, controlled on fenofibrate, niacin, and fish oil.

## 2012-12-14 ENCOUNTER — Ambulatory Visit (INDEPENDENT_AMBULATORY_CARE_PROVIDER_SITE_OTHER): Payer: Medicare Other | Admitting: Cardiology

## 2012-12-14 ENCOUNTER — Encounter: Payer: Self-pay | Admitting: Cardiology

## 2012-12-14 VITALS — BP 154/70 | HR 77 | Ht 69.5 in | Wt 198.4 lb

## 2012-12-14 DIAGNOSIS — E785 Hyperlipidemia, unspecified: Secondary | ICD-10-CM

## 2012-12-14 DIAGNOSIS — I10 Essential (primary) hypertension: Secondary | ICD-10-CM

## 2012-12-14 DIAGNOSIS — I2510 Atherosclerotic heart disease of native coronary artery without angina pectoris: Secondary | ICD-10-CM

## 2012-12-14 DIAGNOSIS — E119 Type 2 diabetes mellitus without complications: Secondary | ICD-10-CM

## 2012-12-14 DIAGNOSIS — Z951 Presence of aortocoronary bypass graft: Secondary | ICD-10-CM

## 2012-12-14 MED ORDER — AMLODIPINE BESYLATE 2.5 MG PO TABS: 2.5000 mg | ORAL_TABLET | Freq: Every day | ORAL | Status: DC

## 2012-12-14 MED ORDER — LISINOPRIL 20 MG PO TABS: 20.0000 mg | ORAL_TABLET | Freq: Two times a day (BID) | ORAL | Status: DC

## 2012-12-14 MED ORDER — METOPROLOL TARTRATE 50 MG PO TABS: 75.0000 mg | ORAL_TABLET | Freq: Two times a day (BID) | ORAL | Status: DC

## 2012-12-14 MED ORDER — CILOSTAZOL 100 MG PO TABS: 100.0000 mg | ORAL_TABLET | Freq: Two times a day (BID) | ORAL | Status: DC

## 2012-12-14 NOTE — Patient Instructions (Signed)
Continue your current medication  We will add amlodipine 2.5 mg daily for your blood pressure.  Have Dr. Arelia Sneddon check fasting blood work and send me a copy. We may want to consider a statin drug.  I will see you in 6 months.

## 2012-12-15 NOTE — Progress Notes (Signed)
Steve [name]. Date of Birth: 1941/01/25 Medical Record [id number]  History of Present Illness: Steve [name] is seen today for followup. He had CABG x 4 in February 2013.  EF was normal.  Other problems include HTN, DM, and HLD. He does have some neuropathy of his feet. On followup today he reports he is feeling very well. He has a treadmill now and is planning to do more walking. He has gained 9 pounds since his last visit. His blood pressure has been elevated at home similar to his readings today.  Current Outpatient Prescriptions on File Prior to Visit  Medication Sig Dispense Refill  . aspirin 325 MG tablet Take 325 mg by mouth daily.      . fenofibrate micronized (LOFIBRA) 200 MG capsule Take 200 mg by mouth daily before breakfast.        . Inositol Niacinate (NIACIN FLUSH FREE) 500 MG CAPS Take 500 mg by mouth daily.      . metFORMIN (GLUCOPHAGE) 1000 MG tablet Take 1 tablet (1,000 mg total) by mouth 2 (two) times daily with a meal.      . Omega-3 Fatty Acids (OMEGA 3 PO) Take 1,200 mg by mouth 3 (three) times daily.      . [DISCONTINUED] furosemide (LASIX) 40 MG tablet Take 1 tablet (40 mg total) by mouth daily. For 4 days then stop.  4 tablet  0  . [DISCONTINUED] potassium chloride SA (K-DUR,KLOR-CON) 20 MEQ tablet Take 1 tablet (20 mEq total) by mouth daily. For 4 days the stop.  4 tablet  0   No current facility-administered medications on file prior to visit.    No Known Allergies  Past Medical History  Diagnosis Date  . Diabetes mellitus     type 2  . Hypertension   . Peripheral arterial disease   . Hyperlipidemia   . CAD (coronary artery disease)     s/p CABG x 29 Nov 2011 with LIMA to LAD, SVG to OM1 and distal LCX, SVG to ramus intermediate  . PAF (paroxysmal atrial fibrillation)     post op atrial fib 11/2011; short course of amiodarone; stopped 01/25/12  . Left bundle branch block   . Right bundle branch block     Past Surgical History  Procedure Laterality Date   . Cholecystectomy  1999  . Cardiac catheterization  01/16/1999    Est. EF of 65% -- Nonobstruction atherosclerotic coronary artery disease -- Normal left ventricular function      . Cardiac electrophysiology study and ablation  05/21/1999    Normal sinus funtion -- Mildly prolonged interatrial conduction times -- Normal A-V node funtion -- Normal His Purkinje system function -- fNo accessory pathway -- No inducible ventricular tachycardia in the presence of the or in the absence of isoproterenol with programmed stimulation or with burst pacing -- Nikki Dom, M.D. FACC surgan          . Coronary artery bypass graft  12/10/2011    Procedure: CORONARY ARTERY BYPASS GRAFTING (CABG);  Surgeon: Tharon Aquas Adelene Idler, MD;  Location: [facility];  Service: Open Heart Surgery;  Laterality: N/A;  Coronary Artery bypass graft on pump times four utilizing left internal mammary artery and left saphenous vein harvested endoscopically    History  Smoking status  . Former Smoker -- 1.50 packs/day for 20 years  . Types: Cigarettes  . Quit date: 05/27/1979  Smokeless tobacco  . Former User    History  Alcohol Use No  Family History  Problem Relation Age of Onset  . Stroke Mother 59    Cerebellar hemorrhage    Review of Systems: The review of systems is positive for neuropathy of his feet.  All other systems were reviewed and are negative.  Physical Exam: BP 154/70  Pulse 77  Ht 5' 9.5" (1.765 m)  Wt 198 lb 6.4 oz (89.994 kg)  BMI 28.89 kg/m2  SpO2 98% Patient is very pleasant and in no acute distress. Skin is warm and dry. Color is normal.  HEENT is unremarkable. Normocephalic/atraumatic. PERRL. Sclera are nonicteric. Neck is supple. No masses. No JVD. Lungs are clear. Cardiac exam shows a regular rate and rhythm. Abdomen is soft. Extremities are without edema. Gait and ROM are intact. No gross neurologic deficits noted.  LABORATORY DATA:   Assessment / Plan: 1. Coronary disease status  post CABG. He has made an excellent recovery. He has no restrictions at this time. We will continue with risk factor modification.  2. Diabetes mellitus type 2, controlled.  3. Hypertension still not optimally controlled despite increased ACE inhibitor dose. We will continue lisinopril and metoprolol. I've added amlodipine 2.5 mg daily.  4. Hyperlipidemia on fenofibrate, niacin, and fish oil. Apparently he has had some myalgias on Lipitor before. He needs fasting lab work. He is not fasting today so he is going to arrange to have this done with Dr. Arelia Sneddon.

## 2012-12-26 ENCOUNTER — Ambulatory Visit: Payer: Medicare Other | Admitting: Cardiology

## 2013-01-17 ENCOUNTER — Encounter: Payer: Self-pay | Admitting: Cardiology

## 2013-01-23 ENCOUNTER — Encounter: Payer: Self-pay | Admitting: Cardiology

## 2013-02-06 ENCOUNTER — Encounter: Payer: Self-pay | Admitting: Cardiology

## 2013-02-06 ENCOUNTER — Encounter: Payer: Self-pay | Admitting: *Deleted

## 2013-02-08 ENCOUNTER — Encounter: Payer: Self-pay | Admitting: Cardiology

## 2013-03-17 ENCOUNTER — Other Ambulatory Visit: Payer: Self-pay | Admitting: Cardiology

## 2013-03-20 ENCOUNTER — Telehealth: Payer: Self-pay | Admitting: Cardiology

## 2013-03-20 NOTE — Telephone Encounter (Signed)
I spoke with patient who states he is running out of his Amlodipine each month because Dr. Arelia Sneddon, his PCP increased his dose from one pill (2.5 mg) to 2 pills (5 mg) per day.  I advised patient that Dr. Arelia Sneddon should send a prescription with his recommended change to the patient's pharmacy to avoid error since he is the physician that prescribed this change.  Patient got angry and stated he would just quit taking the medication all together.  I explained to patient that we want to help him and that we are practicing caution so that medications do not get duplicated or that patient does not get confused on dosage.  Patient hung up.

## 2013-03-20 NOTE — Telephone Encounter (Signed)
cilostazol (PLETAL) 100 MG tablet 60 tablet 11 12/14/2012      Take 1 tablet (100 mg total) by mouth 2 (two) times daily. - Oral

## 2013-03-20 NOTE — Telephone Encounter (Signed)
New problem   Pt need a new prescription for Amlodipine besylate 2.5mg  [facility] It need to be change from 1 tablet a day to 2 tablets a day

## 2013-05-14 ENCOUNTER — Other Ambulatory Visit: Payer: Self-pay | Admitting: Cardiology

## 2013-05-14 NOTE — Telephone Encounter (Signed)
cilostazol (PLETAL) 100 MG tablet 60 tablet 11 12/14/2012 Take 1 tablet (100 mg total) by mouth 2 (two) times daily. - Oral

## 2013-05-16 ENCOUNTER — Other Ambulatory Visit: Payer: Self-pay | Admitting: Cardiology

## 2013-05-18 ENCOUNTER — Other Ambulatory Visit: Payer: Self-pay | Admitting: Cardiology

## 2013-05-18 NOTE — Telephone Encounter (Signed)
   Disp Refills Start End    cilostazol (PLETAL) 100 MG tablet 60 tablet 11 12/14/2012      Take 1 tablet (100 mg total) by mouth 2 (two) times daily. - Oral

## 2013-06-01 ENCOUNTER — Encounter: Payer: Self-pay | Admitting: Cardiology

## 2013-06-01 ENCOUNTER — Ambulatory Visit (INDEPENDENT_AMBULATORY_CARE_PROVIDER_SITE_OTHER): Payer: Medicare Other | Admitting: Cardiology

## 2013-06-01 VITALS — BP 168/64 | HR 76 | Ht 69.5 in | Wt 181.8 lb

## 2013-06-01 DIAGNOSIS — I2510 Atherosclerotic heart disease of native coronary artery without angina pectoris: Secondary | ICD-10-CM

## 2013-06-01 DIAGNOSIS — E785 Hyperlipidemia, unspecified: Secondary | ICD-10-CM

## 2013-06-01 DIAGNOSIS — I471 Supraventricular tachycardia: Secondary | ICD-10-CM

## 2013-06-01 DIAGNOSIS — I739 Peripheral vascular disease, unspecified: Secondary | ICD-10-CM

## 2013-06-01 DIAGNOSIS — I4719 Other supraventricular tachycardia: Secondary | ICD-10-CM

## 2013-06-01 DIAGNOSIS — I10 Essential (primary) hypertension: Secondary | ICD-10-CM

## 2013-06-01 MED ORDER — CILOSTAZOL 100 MG PO TABS: 100.0000 mg | ORAL_TABLET | Freq: Two times a day (BID) | ORAL | Status: DC

## 2013-06-01 NOTE — Patient Instructions (Addendum)
At your convenience we will schedule you for lower extremity arterial dopplers.  Continue your current medication  I will see you in 6 months.

## 2013-06-01 NOTE — Progress Notes (Addendum)
Steve [name]. Date of Birth: 10-Mar-1941 Medical Record [id number]  History of Present Illness: [name] is seen today for followup. He had CABG x 4 in February 2013.  EF was normal.  Other problems include HTN, DM, PVD, and HLD. He does have some neuropathy of his feet. On followup today he reports he is doing well from a cardiac standpoint. He has no symptoms of chest pain, shortness of breath, or palpitations. He has experienced fairly frequent leg cramps that tend to occur in the early morning while he is in bed. He does have a history of peripheral arterial disease. He had an angiogram in 2009 which showed occlusion of the right  SFA, severe right tibial disease, and left popliteal disease. He was treated medically. I cannot see that he has had any followup since then. Patient's wife is currently hospitalized with acute cholecystitis/cholangitis. He is very stressed about her situation.  Current Outpatient Prescriptions on File Prior to Visit  Medication Sig Dispense Refill  . aspirin 325 MG tablet Take 325 mg by mouth daily.      . fenofibrate micronized (LOFIBRA) 200 MG capsule Take 200 mg by mouth daily before breakfast.        . Inositol Niacinate (NIACIN FLUSH FREE) 500 MG CAPS Take 500 mg by mouth daily.      Marland Kitchen lisinopril (PRINIVIL,ZESTRIL) 20 MG tablet Take 1 tablet (20 mg total) by mouth 2 (two) times daily.  180 tablet  5  . metFORMIN (GLUCOPHAGE) 1000 MG tablet Take 1 tablet (1,000 mg total) by mouth 2 (two) times daily with a meal.      . Omega-3 Fatty Acids (OMEGA 3 PO) Take 1,200 mg by mouth 3 (three) times daily.      . [DISCONTINUED] furosemide (LASIX) 40 MG tablet Take 1 tablet (40 mg total) by mouth daily. For 4 days then stop.  4 tablet  0  . [DISCONTINUED] potassium chloride SA (K-DUR,KLOR-CON) 20 MEQ tablet Take 1 tablet (20 mEq total) by mouth daily. For 4 days the stop.  4 tablet  0   No current facility-administered medications on file prior to visit.    No Known  Allergies  Past Medical History  Diagnosis Date  . Diabetes mellitus     type 2  . Hypertension   . Peripheral arterial disease   . Hyperlipidemia   . CAD (coronary artery disease)     s/p CABG x 29 Nov 2011 with LIMA to LAD, SVG to OM1 and distal LCX, SVG to ramus intermediate  . PAF (paroxysmal atrial fibrillation)     post op atrial fib 11/2011; short course of amiodarone; stopped 01/25/12  . Left bundle branch block   . Right bundle branch block     Past Surgical History  Procedure Laterality Date  . Cholecystectomy  1999  . Cardiac catheterization  01/16/1999    Est. EF of 65% -- Nonobstruction atherosclerotic coronary artery disease -- Normal left ventricular function      . Cardiac electrophysiology study and ablation  05/21/1999    Normal sinus funtion -- Mildly prolonged interatrial conduction times -- Normal A-V node funtion -- Normal His Purkinje system function -- fNo accessory pathway -- No inducible ventricular tachycardia in the presence of the or in the absence of isoproterenol with programmed stimulation or with burst pacing -- Nikki Dom, M.D. FACC surgan          . Coronary artery bypass graft  12/10/2011  Procedure: CORONARY ARTERY BYPASS GRAFTING (CABG);  Surgeon: Tharon Aquas Adelene Idler, MD;  Location: [facility];  Service: Open Heart Surgery;  Laterality: N/A;  Coronary Artery bypass graft on pump times four utilizing left internal mammary artery and left saphenous vein harvested endoscopically    History  Smoking status  . Former Smoker -- 1.50 packs/day for 20 years  . Types: Cigarettes  . Quit date: 05/27/1979  Smokeless tobacco  . Former User    History  Alcohol Use No    Family History  Problem Relation Age of Onset  . Stroke Mother 24    Cerebellar hemorrhage    Review of Systems: The review of systems is positive for neuropathy of his feet.  All other systems were reviewed and are negative.  Physical Exam: BP 168/64  Pulse 76  Ht 5'  9.5" (1.765 m)  Wt 181 lb 12.8 oz (82.464 kg)  BMI 26.47 kg/m2 Patient is very pleasant and in no acute distress. Skin is warm and dry. Color is normal.  HEENT is unremarkable. Normocephalic/atraumatic. PERRL. Sclera are nonicteric. Neck is supple. No masses. No JVD. Lungs are clear. Cardiac exam shows a regular rate and rhythm. Abdomen is soft. Extremities are without edema. Gait and ROM are intact. No gross neurologic deficits noted.  LABORATORY DATA:  ECG demonstrates normal sinus rhythm with left anterior fascicular block. There is T wave inversion in the anterior leads. Compared to his ECG in April 2013 the right bundle branch block has resolved.  Assessment / Plan: 1. Coronary disease status post CABG. doing very well. We will continue with risk factor modification.  2. Diabetes mellitus type 2, controlled.  3. Hypertension continue medical therapy. Recent stressors have increased his blood pressure.  4. Hyperlipidemia on fenofibrate, niacin, and fish oil. History of intolerance to statins.  5. PAD. Arteriogram done in 2009. No recent followup. I recommended lower extremity arterial Dopplers and he is going to schedule these once his wife's condition is improved.

## 2013-07-05 ENCOUNTER — Other Ambulatory Visit: Payer: Self-pay | Admitting: *Deleted

## 2013-07-05 DIAGNOSIS — I2510 Atherosclerotic heart disease of native coronary artery without angina pectoris: Secondary | ICD-10-CM

## 2013-07-05 DIAGNOSIS — I739 Peripheral vascular disease, unspecified: Secondary | ICD-10-CM

## 2013-07-05 MED ORDER — CILOSTAZOL 100 MG PO TABS: 100.0000 mg | ORAL_TABLET | Freq: Two times a day (BID) | ORAL | Status: DC

## 2013-07-20 ENCOUNTER — Other Ambulatory Visit: Payer: Self-pay | Admitting: Cardiology

## 2013-10-10 ENCOUNTER — Encounter: Payer: Self-pay | Admitting: Cardiology

## 2013-11-29 ENCOUNTER — Ambulatory Visit: Payer: Medicare Other | Admitting: Cardiology

## 2013-12-18 ENCOUNTER — Ambulatory Visit (INDEPENDENT_AMBULATORY_CARE_PROVIDER_SITE_OTHER): Payer: Medicare Other | Admitting: Cardiology

## 2013-12-18 ENCOUNTER — Encounter: Payer: Self-pay | Admitting: Cardiology

## 2013-12-18 VITALS — BP 180/66 | HR 77 | Ht 70.0 in | Wt 190.0 lb

## 2013-12-18 DIAGNOSIS — E785 Hyperlipidemia, unspecified: Secondary | ICD-10-CM

## 2013-12-18 DIAGNOSIS — I2510 Atherosclerotic heart disease of native coronary artery without angina pectoris: Secondary | ICD-10-CM

## 2013-12-18 DIAGNOSIS — I739 Peripheral vascular disease, unspecified: Secondary | ICD-10-CM

## 2013-12-18 DIAGNOSIS — I10 Essential (primary) hypertension: Secondary | ICD-10-CM

## 2013-12-18 NOTE — Progress Notes (Signed)
Steve [name]. Date of Birth: 06-07-1941 Medical Record [id number]  History of Present Illness: Steve [name] is seen today for followup. He had CABG x 4 in February 2013.  EF was normal.  Other problems include HTN, DM, PVD, and HLD. He does have some neuropathy of his feet. On followup today he reports he is doing well from a cardiac standpoint. He has no symptoms of chest pain, shortness of breath, or palpitations. He does have significant claudication symptoms and feels this has progressed on the right.  He had an angiogram in 2009 which showed occlusion of the right  SFA, severe right tibial disease, and left popliteal disease. He was treated medically. ABIs in Feb 2013 showed moderate reduction bilaterally. Patient's wife is currently hospitalized and is going to need to go to a facility. He is very stressed about her situation. His BP is consistently high. His home medications do not correlate with his list here.  Current Outpatient Prescriptions on File Prior to Visit  Medication Sig Dispense Refill  . amLODipine (NORVASC) 2.5 MG tablet Take 5 mg by mouth daily.      Marland Kitchen aspirin 325 MG tablet Take 325 mg by mouth daily.      . cilostazol (PLETAL) 100 MG tablet Take 1 tablet (100 mg total) by mouth 2 (two) times daily.  90 tablet  3  . Inositol Niacinate (NIACIN FLUSH FREE) 500 MG CAPS Take 500 mg by mouth daily.      Marland Kitchen lisinopril (PRINIVIL,ZESTRIL) 20 MG tablet Take 1 tablet (20 mg total) by mouth 2 (two) times daily.  180 tablet  5  . lovastatin (MEVACOR) 20 MG tablet Take 20 mg by mouth at bedtime.      . metFORMIN (GLUCOPHAGE) 1000 MG tablet Take 1 tablet (1,000 mg total) by mouth 2 (two) times daily with a meal.      . metoprolol (LOPRESSOR) 50 MG tablet Take 50 mg by mouth 4 (four) times daily.      . [DISCONTINUED] furosemide (LASIX) 40 MG tablet Take 1 tablet (40 mg total) by mouth daily. For 4 days then stop.  4 tablet  0  . [DISCONTINUED] potassium chloride SA (K-DUR,KLOR-CON) 20 MEQ  tablet Take 1 tablet (20 mEq total) by mouth daily. For 4 days the stop.  4 tablet  0   No current facility-administered medications on file prior to visit.    No Known Allergies  Past Medical History  Diagnosis Date  . Diabetes mellitus     type 2  . Hypertension   . Peripheral arterial disease   . Hyperlipidemia   . CAD (coronary artery disease)     s/p CABG x 29 Nov 2011 with LIMA to LAD, SVG to OM1 and distal LCX, SVG to ramus intermediate  . PAF (paroxysmal atrial fibrillation)     post op atrial fib 11/2011; short course of amiodarone; stopped 01/25/12  . Right bundle branch block     Past Surgical History  Procedure Laterality Date  . Cholecystectomy  1999  . Cardiac catheterization  01/16/1999    Est. EF of 65% -- Nonobstruction atherosclerotic coronary artery disease -- Normal left ventricular function      . Cardiac electrophysiology study and ablation  05/21/1999    Normal sinus funtion -- Mildly prolonged interatrial conduction times -- Normal A-V node funtion -- Normal His Purkinje system function -- fNo accessory pathway -- No inducible ventricular tachycardia in the presence of the or in the absence of  isoproterenol with programmed stimulation or with burst pacing -- Nikki Dom, M.D. FACC surgan          . Coronary artery bypass graft  12/10/2011    Procedure: CORONARY ARTERY BYPASS GRAFTING (CABG);  Surgeon: Tharon Aquas Adelene Idler, MD;  Location: [facility];  Service: Open Heart Surgery;  Laterality: N/A;  Coronary Artery bypass graft on pump times four utilizing left internal mammary artery and left saphenous vein harvested endoscopically    History  Smoking status  . Former Smoker -- 1.50 packs/day for 20 years  . Types: Cigarettes  . Quit date: 05/27/1979  Smokeless tobacco  . Former User    History  Alcohol Use No    Family History  Problem Relation Age of Onset  . Stroke Mother 91    Cerebellar hemorrhage    Review of Systems: The review of  systems is positive for neuropathy of his feet.  All other systems were reviewed and are negative.  Physical Exam: BP 180/66  Pulse 77  Ht 5\' 10"  (1.778 m)  Wt 190 lb (86.183 kg)  BMI 27.26 kg/m2 Patient is very pleasant and in no acute distress. Skin is warm and dry. Color is normal.  HEENT is unremarkable. Normocephalic/atraumatic. PERRL. Sclera are nonicteric. Neck is supple. No masses. No JVD. Lungs are clear. Cardiac exam shows a regular rate and rhythm. Abdomen is soft. Extremities are without edema.  Pedal pulses are poor without discoloration or ulcers. Gait and ROM are intact. No gross neurologic deficits noted.  LABORATORY DATA:  Reviewed from August 2014. TSH .22, A1c 5.2%, Bun- 13, creatinine 1.2, Cholesterol-135, trig- 132, HDL-41, LDL-68. Other chemistries were normal.  Assessment / Plan: 1. Coronary disease status post CABG. doing very well. We will continue with risk factor modification.  2. Diabetes mellitus type 2, controlled.  3. Hypertension- poorly controlled. Will need medication adjustment. Need first to confirm what medications he is taking then we can make appropriate changes.   4. Hyperlipidemia on mevacor. History of intolerance to statins but is tolerating this so far. No longer on niacin.  5. PAD. Arteriogram done in 2009. No recent followup. I recommended lower extremity arterial Dopplers. May need referral depending on results.   6. Peripheral neuropathy.

## 2013-12-18 NOTE — Patient Instructions (Signed)
Call us back with the medications you are taking at home so we can update your records and adjust your blood pressure medication.  We will schedule you for dopplers on your legs if your circulation is bad I will have you see one of my colleagues who deals with this.  I will see you in 6 months.

## 2013-12-19 ENCOUNTER — Telehealth: Payer: Self-pay | Admitting: Cardiology

## 2013-12-19 MED ORDER — HYDRALAZINE HCL 25 MG PO TABS: 25.0000 mg | ORAL_TABLET | Freq: Three times a day (TID) | ORAL | Status: DC

## 2013-12-19 NOTE — Addendum Note (Signed)
Addended by: Golden Hurter D on: 12/19/2013 05:02 PM   Modules accepted: Orders, Medications

## 2013-12-19 NOTE — Telephone Encounter (Signed)
New message          Pt is no longer taking Inositol Niacinate 500mg 

## 2013-12-19 NOTE — Telephone Encounter (Signed)
Spoke to patient verified all medication with him.Dr.Jordan was told he advised to continue medications and add Hydralazine 25 mg three times a day.90 day prescription [facility] to [facility].

## 2013-12-19 NOTE — Telephone Encounter (Signed)
Returned call to patient he stated he is not taking Inositol Niacinate,will let Dr.Jordan know.

## 2013-12-19 NOTE — Addendum Note (Signed)
Addended by: Golden Hurter D on: 12/19/2013 05:06 PM   Modules accepted: Orders, Medications

## 2013-12-24 ENCOUNTER — Ambulatory Visit (HOSPITAL_COMMUNITY): Payer: Medicare Other | Attending: Cardiology

## 2013-12-24 DIAGNOSIS — E785 Hyperlipidemia, unspecified: Secondary | ICD-10-CM

## 2013-12-24 DIAGNOSIS — I471 Supraventricular tachycardia: Secondary | ICD-10-CM

## 2013-12-24 DIAGNOSIS — I10 Essential (primary) hypertension: Secondary | ICD-10-CM

## 2013-12-24 DIAGNOSIS — I739 Peripheral vascular disease, unspecified: Secondary | ICD-10-CM

## 2013-12-24 DIAGNOSIS — I2510 Atherosclerotic heart disease of native coronary artery without angina pectoris: Secondary | ICD-10-CM | POA: Insufficient documentation

## 2013-12-27 ENCOUNTER — Telehealth: Payer: Self-pay | Admitting: Cardiology

## 2013-12-27 NOTE — Telephone Encounter (Signed)
New message ° ° ° ° ° °Returning a nurses call °

## 2013-12-27 NOTE — Telephone Encounter (Signed)
Returned call to patient lower ext doppler results given.Patient wants to call back to schedule appointment with Dr.Arida.Stated his wife just died and her funeral was yesterday 12/26/13.

## 2014-01-16 ENCOUNTER — Other Ambulatory Visit: Payer: Self-pay | Admitting: Cardiology

## 2014-06-03 ENCOUNTER — Other Ambulatory Visit: Payer: Self-pay | Admitting: Cardiology

## 2014-06-25 ENCOUNTER — Encounter: Payer: Self-pay | Admitting: Cardiology

## 2014-06-25 ENCOUNTER — Ambulatory Visit (INDEPENDENT_AMBULATORY_CARE_PROVIDER_SITE_OTHER): Payer: Medicare Other | Admitting: Cardiology

## 2014-06-25 VITALS — BP 144/64 | HR 75 | Ht 70.0 in | Wt 194.0 lb

## 2014-06-25 DIAGNOSIS — I10 Essential (primary) hypertension: Secondary | ICD-10-CM

## 2014-06-25 DIAGNOSIS — I739 Peripheral vascular disease, unspecified: Secondary | ICD-10-CM

## 2014-06-25 DIAGNOSIS — I2510 Atherosclerotic heart disease of native coronary artery without angina pectoris: Secondary | ICD-10-CM

## 2014-06-25 NOTE — Patient Instructions (Signed)
Continue your current therapy  I will see you in 6 months.   

## 2014-06-25 NOTE — Progress Notes (Signed)
Steve [name]. Date of Birth: 26-Jun-1941 Medical Record [id number]  History of Present Illness: [name] is seen today for followup. He had CABG x 4 in February 2013.  EF was normal.  Other problems include HTN, DM, PVD, and HLD. 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 On followup today he reports he is doing well from a cardiac standpoint. He has no symptoms of chest pain, shortness of breath, or palpitations. He does have chronic claudication that he states is unchanged.  He had an angiogram in 2009 which showed occlusion of the right  SFA, severe right tibial disease, and left popliteal disease. LE dopplers in March are consistent with these findings. He was treated medically. He reports his BP is doing much better. On a trip to [place] he developed sever leg fatigue and his lovastatin and hydralazine were stopped with much improvement. He reports his last A1c was 5.1%. He is having a difficult time adjusting to the death of his wife of 24 years earlier this year.  Current Outpatient Prescriptions on File Prior to Visit  Medication Sig Dispense Refill  . amLODipine (NORVASC) 2.5 MG tablet Take 5 mg by mouth daily.      Marland Kitchen aspirin 325 MG tablet Take 325 mg by mouth daily.      . cilostazol (PLETAL) 100 MG tablet TAKE 1 TABLET TWICE A DAY  180 tablet  0  . lisinopril (PRINIVIL,ZESTRIL) 40 MG tablet Take 1 tablet (40 mg total) by mouth daily.  90 tablet  3  . metFORMIN (GLUCOPHAGE) 1000 MG tablet Take 1 tablet (1,000 mg total) by mouth 2 (two) times daily with a meal.      . [DISCONTINUED] furosemide (LASIX) 40 MG tablet Take 1 tablet (40 mg total) by mouth daily. For 4 days then stop.  4 tablet  0  . [DISCONTINUED] potassium chloride SA (K-DUR,KLOR-CON) 20 MEQ tablet Take 1 tablet (20 mEq total) by mouth daily. For 4 days the stop.  4 tablet  0   No current facility-administered medications on file prior to visit.    Allergies  Allergen Reactions  . Statins Other (See Comments)   Myalgias, muscle weakness    Past Medical History  Diagnosis Date  . Diabetes mellitus     type 2  . Hypertension   . Peripheral arterial disease   . Hyperlipidemia   . CAD (coronary artery disease)     s/p CABG x 29 Nov 2011 with LIMA to LAD, SVG to OM1 and distal LCX, SVG to ramus intermediate  . PAF (paroxysmal atrial fibrillation)     post op atrial fib 11/2011; short course of amiodarone; stopped 01/25/12  . Right bundle branch block     Past Surgical History  Procedure Laterality Date  . Cholecystectomy  1999  . Cardiac catheterization  01/16/1999    Est. EF of 65% -- Nonobstruction atherosclerotic coronary artery disease -- Normal left ventricular function      . Cardiac electrophysiology study and ablation  05/21/1999    Normal sinus funtion -- Mildly prolonged interatrial conduction times -- Normal A-V node funtion -- Normal His Purkinje system function -- fNo accessory pathway -- No inducible ventricular tachycardia in the presence of the or in the absence of isoproterenol with programmed stimulation or with burst pacing -- Nikki Dom, M.D. FACC surgan          . Coronary artery bypass graft  12/10/2011    Procedure: CORONARY ARTERY BYPASS GRAFTING (CABG);  Surgeon: Tharon Aquas Adelene Idler, MD;  Location: [facility];  Service: Open Heart Surgery;  Laterality: N/A;  Coronary Artery bypass graft on pump times four utilizing left internal mammary artery and left saphenous vein harvested endoscopically    History  Smoking status  . Former Smoker -- 1.50 packs/day for 20 years  . Types: Cigarettes  . Quit date: 05/27/1979  Smokeless tobacco  . Former User    History  Alcohol Use No    Family History  Problem Relation Age of Onset  . Stroke Mother 36    Cerebellar hemorrhage    Review of Systems: The review of systems is positive for neuropathy of his feet.  All other systems were reviewed and are negative.  Physical Exam: BP 144/64  Pulse 75  Ht 5\' 10"  (1.778 m)   Wt 194 lb (87.998 kg)  BMI 27.84 kg/m2 Patient is very pleasant and in no acute distress. Skin is warm and dry. Color is normal.  HEENT is unremarkable. Normocephalic/atraumatic. PERRL. Sclera are nonicteric. Neck is supple. No masses. No JVD. Lungs are clear. Cardiac exam shows a regular rate and rhythm. Abdomen is soft. Extremities are without edema.  Pedal pulses are poor without discoloration or ulcers. Gait and ROM are intact. No gross neurologic deficits noted.  LABORATORY DATA:  Ecg: NSR, LAFB, compared to 06/01/13 less T wave inversion anteriorly.  Assessment / Plan: 1. Coronary disease status post CABG. doing very well. We will continue with risk factor modification.  2. Diabetes mellitus type 2, controlled.  3. Hypertension- under fair control.  Continue amlodipine and ACEi.  4. Hyperlipidemia  History of intolerance to statins  5. PAD. Arteriogram done in 2009. Dopplers stable. Patient does not want to pursue further work up.  6. Peripheral neuropathy.

## 2014-09-04 ENCOUNTER — Other Ambulatory Visit: Payer: Self-pay

## 2014-09-04 MED ORDER — CILOSTAZOL 100 MG PO TABS: ORAL_TABLET | ORAL | Status: DC

## 2014-10-03 ENCOUNTER — Encounter (HOSPITAL_COMMUNITY): Payer: Self-pay | Admitting: Cardiology

## 2014-12-16 ENCOUNTER — Ambulatory Visit (INDEPENDENT_AMBULATORY_CARE_PROVIDER_SITE_OTHER): Payer: Medicare Other | Admitting: Cardiology

## 2014-12-16 ENCOUNTER — Encounter: Payer: Self-pay | Admitting: Cardiology

## 2014-12-16 VITALS — BP 172/64 | HR 98 | Ht 69.0 in | Wt 198.4 lb

## 2014-12-16 DIAGNOSIS — I2510 Atherosclerotic heart disease of native coronary artery without angina pectoris: Secondary | ICD-10-CM

## 2014-12-16 DIAGNOSIS — I10 Essential (primary) hypertension: Secondary | ICD-10-CM

## 2014-12-16 DIAGNOSIS — E1142 Type 2 diabetes mellitus with diabetic polyneuropathy: Secondary | ICD-10-CM

## 2014-12-16 DIAGNOSIS — I739 Peripheral vascular disease, unspecified: Secondary | ICD-10-CM

## 2014-12-16 NOTE — Progress Notes (Signed)
Steve [name]. Date of Birth: Jan 27, 1941 Medical Record [id number]  History of Present Illness: [name] is seen today for followup of CAD. He had CABG x 4 in February 2013.  EF was normal.  Other problems include HTN, DM, PVD, and HLD. 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 On followup today he reports he is doing OK. Admits that he is not very active. He has gained weight. He states he just doesn't care anymore since his wife died. He is depressed. He does not want to take anything for it. He is planning on doing some traveling this year and feels he will work thru it. He denies any chest pain or SOB. His major complaint is of neuropathy in his feet. Claudication symptoms are described as minor. He reports his last A1c was 5.3%. He has not monitored his BP.  Current Outpatient Prescriptions on File Prior to Visit  Medication Sig Dispense Refill  . aspirin 325 MG tablet Take 325 mg by mouth daily.    . cilostazol (PLETAL) 100 MG tablet TAKE 1 TABLET TWICE A DAY 180 tablet 3  . lisinopril (PRINIVIL,ZESTRIL) 40 MG tablet Take 1 tablet (40 mg total) by mouth daily. 90 tablet 3  . metFORMIN (GLUCOPHAGE) 1000 MG tablet Take 1 tablet (1,000 mg total) by mouth 2 (two) times daily with a meal.    . [DISCONTINUED] furosemide (LASIX) 40 MG tablet Take 1 tablet (40 mg total) by mouth daily. For 4 days then stop. 4 tablet 0  . [DISCONTINUED] potassium chloride SA (K-DUR,KLOR-CON) 20 MEQ tablet Take 1 tablet (20 mEq total) by mouth daily. For 4 days the stop. 4 tablet 0   No current facility-administered medications on file prior to visit.    Allergies  Allergen Reactions  . Statins Other (See Comments)    Myalgias, muscle weakness    Past Medical History  Diagnosis Date  . Diabetes mellitus     type 2  . Hypertension   . Peripheral arterial disease   . Hyperlipidemia   . CAD (coronary artery disease)     s/p CABG x 29 Nov 2011 with LIMA to LAD, SVG to OM1 and distal LCX, SVG to ramus  intermediate  . PAF (paroxysmal atrial fibrillation)     post op atrial fib 11/2011; short course of amiodarone; stopped 01/25/12  . Right bundle branch block     Past Surgical History  Procedure Laterality Date  . Cholecystectomy  1999  . Cardiac catheterization  01/16/1999    Est. EF of 65% -- Nonobstruction atherosclerotic coronary artery disease -- Normal left ventricular function      . Cardiac electrophysiology study and ablation  05/21/1999    Normal sinus funtion -- Mildly prolonged interatrial conduction times -- Normal A-V node funtion -- Normal His Purkinje system function -- fNo accessory pathway -- No inducible ventricular tachycardia in the presence of the or in the absence of isoproterenol with programmed stimulation or with burst pacing -- Nikki Dom, M.D. FACC surgan          . Coronary artery bypass graft  12/10/2011    Procedure: CORONARY ARTERY BYPASS GRAFTING (CABG);  Surgeon: Tharon Aquas Adelene Idler, MD;  Location: [facility];  Service: Open Heart Surgery;  Laterality: N/A;  Coronary Artery bypass graft on pump times four utilizing left internal mammary artery and left saphenous vein harvested endoscopically  . Left heart catheterization with coronary angiogram N/A 12/09/2011    Procedure: LEFT HEART CATHETERIZATION WITH  CORONARY ANGIOGRAM;  Surgeon: Kentrel Clevenger M Martinique, MD;  Location: [facility] CATH LAB;  Service: Cardiovascular;  Laterality: N/A;    History  Smoking status  . Former Smoker -- 1.50 packs/day for 20 years  . Types: Cigarettes  . Quit date: 05/27/1979  Smokeless tobacco  . Former User    History  Alcohol Use No    Family History  Problem Relation Age of Onset  . Stroke Mother 20    Cerebellar hemorrhage    Review of Systems: The review of systems is positive for neuropathy of his feet.  All other systems were reviewed and are negative.  Physical Exam: BP 172/64 mmHg  Pulse 98  Ht 5\' 9"  (1.753 m)  Wt 198 lb 6.4 oz (89.994 kg)  BMI 29.29  kg/m2 Patient is  pleasant and in no acute distress. Skin is warm and dry. Color is normal.  HEENT is unremarkable. Neck is supple. No masses. No JVD. Lungs are clear. Cardiac exam shows a regular rate and rhythm. Normal S1-2. no gallop or murmur. Abdomen is soft. No masses. Extremities are without edema.  Pedal pulses are poor without discoloration or ulcers. Gait and ROM are intact. No gross neurologic deficits noted.  LABORATORY DATA:    Assessment / Plan: 1. Coronary disease status post CABG. Doing very well- asymptomatic. We will continue with risk factor modification.  2. Diabetes mellitus type 2, controlled.  3. Hypertension- BP is high today. Diet is pretty poor. Recommend sodium restriction. He will monitor his BP at home. If it remains high we will increase his medication ?add Coreg.   Continue amlodipine and ACEi.  4. Hyperlipidemia  History of intolerance to statins  5. PAD. Arteriogram done in 2009. Dopplers stable. Patient does not want to pursue further work up.  6. Peripheral neuropathy.

## 2014-12-16 NOTE — Patient Instructions (Signed)
Keep an eye on your blood pressure and let me know if it stays high.   Eat more vegetables and fruits  I will see you in 6 months.

## 2015-01-29 ENCOUNTER — Telehealth: Payer: Self-pay | Admitting: Cardiology

## 2015-01-29 ENCOUNTER — Telehealth: Payer: Self-pay

## 2015-01-29 MED ORDER — CILOSTAZOL 100 MG PO TABS: ORAL_TABLET | ORAL | Status: DC

## 2015-01-29 NOTE — Telephone Encounter (Signed)
refill 

## 2015-01-29 NOTE — Telephone Encounter (Signed)
Returned call to patient he stated he needed refill for pletal sent to [facility].Stated [facility] out of.[facility].Advised to call back if [facility] is out of.

## 2015-01-29 NOTE — Telephone Encounter (Signed)
Please call,he says [facility] said that Cilostazol is not available. He will need another medicine or [facility] [address] please.

## 2015-01-29 NOTE — Telephone Encounter (Signed)
Received a message from [facility] temporally out of stock.Patient stated mail order pharmacy out of stock too.Patient advised will send message to [facility].

## 2015-02-03 ENCOUNTER — Telehealth: Payer: Self-pay | Admitting: Cardiology

## 2015-02-03 NOTE — Telephone Encounter (Signed)
Pt said you were going to call [facility] on Friday,he is still waiting to find out about his medicine.

## 2015-02-03 NOTE — Telephone Encounter (Signed)
Returned call to patient he stated pharmacy out of Pletal and they do not know when they will get in.Spoke to [place] he advised no other medication to replace.Advised he will have to wait until pletal becomes available.

## 2015-02-11 ENCOUNTER — Telehealth: Payer: Self-pay | Admitting: Cardiology

## 2015-02-11 NOTE — Telephone Encounter (Signed)
Returned call to patient he wanted Dr.Jordan to know pharmacy got pletal in.Stated shipment will be mailed today.

## 2015-02-11 NOTE — Telephone Encounter (Signed)
Pt called in wanting to inform Malachy Mood that [facility] is now going to fill his Cilostazol prescription .   thanks

## 2015-03-31 ENCOUNTER — Encounter (HOSPITAL_COMMUNITY): Payer: Self-pay | Admitting: Emergency Medicine

## 2015-03-31 ENCOUNTER — Emergency Department (HOSPITAL_COMMUNITY): Payer: Medicare Other

## 2015-03-31 ENCOUNTER — Other Ambulatory Visit: Payer: Self-pay

## 2015-03-31 ENCOUNTER — Observation Stay (HOSPITAL_COMMUNITY)
Admission: EM | Admit: 2015-03-31 | Discharge: 2015-04-01 | Disposition: A | Discharge status: Home / Self Care | Payer: Medicare Other | Attending: Internal Medicine | Admitting: Internal Medicine

## 2015-03-31 DIAGNOSIS — I2510 Atherosclerotic heart disease of native coronary artery without angina pectoris: Secondary | ICD-10-CM | POA: Insufficient documentation

## 2015-03-31 DIAGNOSIS — I480 Paroxysmal atrial fibrillation: Secondary | ICD-10-CM | POA: Insufficient documentation

## 2015-03-31 DIAGNOSIS — E785 Hyperlipidemia, unspecified: Secondary | ICD-10-CM | POA: Insufficient documentation

## 2015-03-31 DIAGNOSIS — R9431 Abnormal electrocardiogram [ECG] [EKG]: Secondary | ICD-10-CM

## 2015-03-31 DIAGNOSIS — R531 Weakness: Secondary | ICD-10-CM | POA: Insufficient documentation

## 2015-03-31 DIAGNOSIS — I471 Supraventricular tachycardia: Secondary | ICD-10-CM | POA: Diagnosis present

## 2015-03-31 DIAGNOSIS — R42 Dizziness and giddiness: Secondary | ICD-10-CM | POA: Insufficient documentation

## 2015-03-31 DIAGNOSIS — Z79899 Other long term (current) drug therapy: Secondary | ICD-10-CM | POA: Insufficient documentation

## 2015-03-31 DIAGNOSIS — N182 Chronic kidney disease, stage 2 (mild): Secondary | ICD-10-CM | POA: Diagnosis present

## 2015-03-31 DIAGNOSIS — R5383 Other fatigue: Secondary | ICD-10-CM | POA: Diagnosis present

## 2015-03-31 DIAGNOSIS — R61 Generalized hyperhidrosis: Secondary | ICD-10-CM | POA: Insufficient documentation

## 2015-03-31 DIAGNOSIS — R110 Nausea: Secondary | ICD-10-CM | POA: Insufficient documentation

## 2015-03-31 DIAGNOSIS — Z87891 Personal history of nicotine dependence: Secondary | ICD-10-CM | POA: Insufficient documentation

## 2015-03-31 DIAGNOSIS — I10 Essential (primary) hypertension: Secondary | ICD-10-CM | POA: Insufficient documentation

## 2015-03-31 DIAGNOSIS — E119 Type 2 diabetes mellitus without complications: Secondary | ICD-10-CM | POA: Insufficient documentation

## 2015-03-31 DIAGNOSIS — I739 Peripheral vascular disease, unspecified: Secondary | ICD-10-CM | POA: Insufficient documentation

## 2015-03-31 DIAGNOSIS — Z951 Presence of aortocoronary bypass graft: Secondary | ICD-10-CM | POA: Insufficient documentation

## 2015-03-31 DIAGNOSIS — I4891 Unspecified atrial fibrillation: Secondary | ICD-10-CM | POA: Insufficient documentation

## 2015-03-31 DIAGNOSIS — Z7982 Long term (current) use of aspirin: Secondary | ICD-10-CM | POA: Insufficient documentation

## 2015-03-31 DIAGNOSIS — I209 Angina pectoris, unspecified: Secondary | ICD-10-CM | POA: Diagnosis present

## 2015-03-31 DIAGNOSIS — G629 Polyneuropathy, unspecified: Secondary | ICD-10-CM | POA: Insufficient documentation

## 2015-03-31 DIAGNOSIS — I4510 Unspecified right bundle-branch block: Secondary | ICD-10-CM | POA: Diagnosis present

## 2015-03-31 DIAGNOSIS — R55 Syncope and collapse: Principal | ICD-10-CM | POA: Insufficient documentation

## 2015-03-31 DIAGNOSIS — Z888 Allergy status to other drugs, medicaments and biological substances status: Secondary | ICD-10-CM | POA: Insufficient documentation

## 2015-03-31 LAB — CBC WITH DIFFERENTIAL/PLATELET
BASOS ABS: 0 10*3/uL (ref 0.0–0.1)
BASOS PCT: 0 % (ref 0–1)
EOS PCT: 1 % (ref 0–5)
Eosinophils Absolute: 0.1 10*3/uL (ref 0.0–0.7)
HCT: 39.7 % (ref 39.0–52.0)
HEMOGLOBIN: 14.5 g/dL (ref 13.0–17.0)
LYMPHS ABS: 1.1 10*3/uL (ref 0.7–4.0)
LYMPHS PCT: 9 % — AB (ref 12–46)
MCH: 33.9 pg (ref 26.0–34.0)
MCHC: 36.5 g/dL — ABNORMAL HIGH (ref 30.0–36.0)
MCV: 92.8 fL (ref 78.0–100.0)
MONOS PCT: 8 % (ref 3–12)
Monocytes Absolute: 0.9 10*3/uL (ref 0.1–1.0)
Neutro Abs: 9.8 10*3/uL — ABNORMAL HIGH (ref 1.7–7.7)
Neutrophils Relative %: 82 % — ABNORMAL HIGH (ref 43–77)
PLATELETS: 137 10*3/uL — AB (ref 150–400)
RBC: 4.28 MIL/uL (ref 4.22–5.81)
RDW: 13.6 % (ref 11.5–15.5)
WBC: 12 10*3/uL — ABNORMAL HIGH (ref 4.0–10.5)

## 2015-03-31 LAB — I-STAT CHEM 8, ED
BUN: 8 mg/dL (ref 6–20)
CALCIUM ION: 1.2 mmol/L (ref 1.13–1.30)
Chloride: 102 mmol/L (ref 101–111)
Creatinine, Ser: 1.5 mg/dL — ABNORMAL HIGH (ref 0.61–1.24)
Glucose, Bld: 149 mg/dL — ABNORMAL HIGH (ref 65–99)
HCT: 43 % (ref 39.0–52.0)
Hemoglobin: 14.6 g/dL (ref 13.0–17.0)
POTASSIUM: 4 mmol/L (ref 3.5–5.1)
Sodium: 137 mmol/L (ref 135–145)
TCO2: 19 mmol/L (ref 0–100)

## 2015-03-31 LAB — I-STAT TROPONIN, ED: Troponin i, poc: 0.01 ng/mL (ref 0.00–0.08)

## 2015-03-31 MED ORDER — ASPIRIN 81 MG PO CHEW
324.0000 mg | CHEWABLE_TABLET | Freq: Once | ORAL | Status: AC
  Administered 2015-03-31: 324 mg via ORAL
  Filled 2015-03-31: qty 4

## 2015-03-31 MED ORDER — HEPARIN SODIUM (PORCINE) 5000 UNIT/ML IJ SOLN
INTRAMUSCULAR | Status: AC
  Administered 2015-03-31: 5000 [IU]
  Filled 2015-03-31: qty 1

## 2015-03-31 MED ORDER — SODIUM CHLORIDE 0.9 % IV BOLUS (SEPSIS)
500.0000 mL | Freq: Once | INTRAVENOUS | Status: AC
  Administered 2015-03-31: 500 mL via INTRAVENOUS

## 2015-03-31 MED ORDER — HEPARIN SODIUM (PORCINE) 1000 UNIT/ML IJ SOLN
5000.0000 [IU] | Freq: Once | INTRAMUSCULAR | Status: AC
  Filled 2015-03-31: qty 5

## 2015-03-31 MED ORDER — HEPARIN BOLUS VIA INFUSION: 4000.0000 [IU] | Freq: Once | INTRAVENOUS | Status: DC

## 2015-03-31 NOTE — ED Notes (Signed)
Code Stemi ; paged [facility]

## 2015-03-31 NOTE — ED Notes (Signed)
Per EMS:  Pt w/ a hx of quadruple bypass.  Pt had cataracts removal this morning.  Pt sts he had been straining to have a bowel movement ever since.  Pt was at home in his chair and began to experience cold sweats and was unable to think straight, talk or move.  At this time all deficits have resolved.  EKG unremarkable.  Pt alert and oriented in room at this time.

## 2015-03-31 NOTE — Consult Note (Signed)
Reason for Consult: EKG changes Referring Physician: Dr. Tamera Punt Primary Cardiologist: Dr. Martinique  [name] Kindley Jr. is an 74 y.o. male.  HPI: Steve [name] is a 74 yo man with PMH of CAd s/p 4v CABG 2/13, T2DM, PAD, hypertension and dyslipidemia and chronic neuropathy last seen by Dr. Martinique 12/16/14 who presents with feeling weak, dizzy and diaphoretic. He denies chest pain or shortness of breath. Stable claudication. ER physician initially called code STEMI given ECG with some subtle inferior ECG changes and history. However, Steve [name] was asymptomatic at the time except for needing to have a bowel movement. The subtle ECG changes (already with inferior Qs but subtle elevation and ST flattening in V2/V3) resolved with repeat ECG on my examination. Initial troponin 0.01. He denies recent change in exercise tolerance, chest pain or dizziness. He also had some mild epigastric discomfort.     Past Medical History  Diagnosis Date  . Diabetes mellitus     type 2  . Hypertension   . Peripheral arterial disease   . Hyperlipidemia   . CAD (coronary artery disease)     s/p CABG x 29 Nov 2011 with LIMA to LAD, SVG to OM1 and distal LCX, SVG to ramus intermediate  . PAF (paroxysmal atrial fibrillation)     post op atrial fib 11/2011; short course of amiodarone; stopped 01/25/12  . Right bundle branch block     Past Surgical History  Procedure Laterality Date  . Cholecystectomy  1999  . Cardiac catheterization  01/16/1999    Est. EF of 65% -- Nonobstruction atherosclerotic coronary artery disease -- Normal left ventricular function      . Cardiac electrophysiology study and ablation  05/21/1999    Normal sinus funtion -- Mildly prolonged interatrial conduction times -- Normal A-V node funtion -- Normal His Purkinje system function -- fNo accessory pathway -- No inducible ventricular tachycardia in the presence of the or in the absence of isoproterenol with programmed stimulation or with burst pacing --  Nikki Dom, M.D. FACC surgan          . Coronary artery bypass graft  12/10/2011    Procedure: CORONARY ARTERY BYPASS GRAFTING (CABG);  Surgeon: Tharon Aquas Adelene Idler, MD;  Location: [facility];  Service: Open Heart Surgery;  Laterality: N/A;  Coronary Artery bypass graft on pump times four utilizing left internal mammary artery and left saphenous vein harvested endoscopically  . Left heart catheterization with coronary angiogram N/A 12/09/2011    Procedure: LEFT HEART CATHETERIZATION WITH CORONARY ANGIOGRAM;  Surgeon: Peter M Martinique, MD;  Location: [facility] CATH LAB;  Service: Cardiovascular;  Laterality: N/A;    Family History  Problem Relation Age of Onset  . Stroke Mother 81    Cerebellar hemorrhage    Social History:  reports that he quit smoking about 35 years ago. His smoking use included Cigarettes. He has a 30 pack-year smoking history. He has quit using smokeless tobacco. He reports that he drinks about 2.4 oz of alcohol per week. He reports that he does not use illicit drugs.  Allergies:  Allergies  Allergen Reactions  . Statins Other (See Comments)    Myalgias, muscle weakness    Medications: I have reviewed the patient's current medications. Prior to Admission:  (Not in a hospital admission) Scheduled:  Results for orders placed or performed during the hospital encounter of 03/31/15 (from the past 48 hour(s))  CBC with Differential     Status: Abnormal   Collection Time: 03/31/15  10:35 PM  Result Value Ref Range   WBC 12.0 (H) 4.0 - 10.5 K/uL   RBC 4.28 4.22 - 5.81 MIL/uL   Hemoglobin 14.5 13.0 - 17.0 g/dL   HCT 39.7 39.0 - 52.0 %   MCV 92.8 78.0 - 100.0 fL   MCH 33.9 26.0 - 34.0 pg   MCHC 36.5 (H) 30.0 - 36.0 g/dL   RDW 13.6 11.5 - 15.5 %   Platelets 137 (L) 150 - 400 K/uL   Neutrophils Relative % 82 (H) 43 - 77 %   Neutro Abs 9.8 (H) 1.7 - 7.7 K/uL   Lymphocytes Relative 9 (L) 12 - 46 %   Lymphs Abs 1.1 0.7 - 4.0 K/uL   Monocytes Relative 8 3 - 12 %   Monocytes  Absolute 0.9 0.1 - 1.0 K/uL   Eosinophils Relative 1 0 - 5 %   Eosinophils Absolute 0.1 0.0 - 0.7 K/uL   Basophils Relative 0 0 - 1 %   Basophils Absolute 0.0 0.0 - 0.1 K/uL  I-stat troponin, ED     Status: None   Collection Time: 03/31/15 10:39 PM  Result Value Ref Range   Troponin i, poc 0.01 0.00 - 0.08 ng/mL   Comment 3            Comment: Due to the release kinetics of cTnI, a negative result within the first hours of the onset of symptoms does not rule out myocardial infarction with certainty. If myocardial infarction is still suspected, repeat the test at appropriate intervals.   I-stat Chem 8, ED     Status: Abnormal   Collection Time: 03/31/15 10:41 PM  Result Value Ref Range   Sodium 137 135 - 145 mmol/L   Potassium 4.0 3.5 - 5.1 mmol/L   Chloride 102 101 - 111 mmol/L   BUN 8 6 - 20 mg/dL   Creatinine, Ser 1.50 (H) 0.61 - 1.24 mg/dL   Glucose, Bld 149 (H) 65 - 99 mg/dL   Calcium, Ion 1.20 1.13 - 1.30 mmol/L   TCO2 19 0 - 100 mmol/L   Hemoglobin 14.6 13.0 - 17.0 g/dL   HCT 43.0 39.0 - 52.0 %    No results found.  Review of Systems  Constitutional: Positive for malaise/fatigue and diaphoresis. Negative for fever, chills and weight loss.  HENT: Negative for ear discharge and ear pain.   Eyes: Negative for double vision and photophobia.  Respiratory: Positive for shortness of breath. Negative for cough and hemoptysis.   Cardiovascular: Positive for claudication. Negative for chest pain and palpitations.  Gastrointestinal: Positive for diarrhea. Negative for vomiting and abdominal pain.  Genitourinary: Negative for dysuria and urgency.  Musculoskeletal: Negative for myalgias and neck pain.  Skin: Negative for rash.  Neurological: Positive for dizziness and weakness. Negative for tingling, tremors, speech change and headaches.  Endo/Heme/Allergies: Negative for polydipsia.  Psychiatric/Behavioral: Negative for depression, suicidal ideas and substance abuse.   Blood  pressure 148/89, pulse 85, temperature 98.4 F (36.9 C), temperature source Oral, resp. rate 20, height 5\' 10"  (1.778 m), weight 86.183 kg (190 lb), SpO2 98 %. Physical Exam  Nursing note and vitals reviewed. Constitutional: He is oriented to person, place, and time. He appears well-developed and well-nourished. No distress.  HENT:  Head: Normocephalic and atraumatic.  Nose: Nose normal.  Mouth/Throat: Oropharynx is clear and moist. No oropharyngeal exudate.  Eyes: Conjunctivae and EOM are normal. Pupils are equal, round, and reactive to light. No scleral icterus.  Neck: Normal range of  motion. Neck supple. No JVD present. No tracheal deviation present.  Cardiovascular: Normal rate, regular rhythm, normal heart sounds and intact distal pulses.  Exam reveals no gallop.   No murmur heard. Respiratory: Effort normal and breath sounds normal. No respiratory distress. He has no wheezes. He has no rales.  GI: Soft. Bowel sounds are normal. He exhibits no distension. There is no tenderness. There is no rebound.  Musculoskeletal: Normal range of motion. He exhibits no edema or tenderness.  Neurological: He is alert and oriented to person, place, and time. No cranial nerve deficit.  Skin: Skin is warm and dry. No rash noted. He is not diaphoretic. No erythema.  Psychiatric: He has a normal mood and affect. His behavior is normal. Thought content normal.  labs reviewed; K 4.0, Cr 1.5, trop 0.01, h/h 14.6/43 ECG with SR, LAE, inferior subtle ST elevation with old known q'waves, ST flattening V2/V3 that largely resolve with repeat ECG   Assessment/Plan:  Steve [name] is a 74 yo man with PMH of CAd s/p 4v CABG 2/13, T2DM, PAD, hypertension and dyslipidemia and chronic neuropathy last seen by Dr. Martinique 12/16/14 who presents with feeling weak, dizzy and diaphoretic. Differential diagnosis for his symptoms include vasospasm, hypovolemia, infection, electrolyte abnormality, ACS among many other etiologies.  He does have known CAD and risk factors (male, age > 2, known CAD/PAD, hypertension/dyslipidemia); however no recent symptoms. Given transient ECG changes but asymptomatic, favor observing overnight and potentially pursuing stress testing vs. Echo in AM.  Problem List Diaphoresis, Weakness ECG changes - subtle Known CAD PAD Dyslipidemia Hypertension Peripheral Neuropathy T2DM Plan 1. Favor observation overnight, trend cardiac biomarkers, telemetry, daily asa 81 mg 2. Update labs 3. Echocardiogram in AM vs. Stress test  4. Hold metformin  5. Continue home antihypertensive medications  Steve [name] 03/31/2015, 11:37 PM

## 2015-03-31 NOTE — ED Provider Notes (Signed)
CSN: [id number]     Arrival date & time 03/31/15  2208 History   First MD Initiated Contact with Patient 03/31/15 2225     Chief Complaint  Patient presents with  . Weakness  . Code STEMI     (Consider location/radiation/quality/duration/timing/severity/associated sxs/prior Treatment) HPI Comments: Patient presents after near syncopal episode. He has a history of diabetes and coronary artery disease. He had a four-vessel bypass in 2013.  He states that he was sitting in a chair today and started having episode of diaphoresis. He felt weak all over. He was unable to talk or walk because of the profound weakness. He had some nausea and shortness of breath. He denies any chest discomfort. He states he was weak all over. He did not have any unilateral weakness. He has not had these symptoms before. He did have cataract surgery this morning.  Patient is a 74 y.o. male presenting with weakness.  Weakness Associated symptoms include shortness of breath. Pertinent negatives include no chest pain, no abdominal pain and no headaches.    Past Medical History  Diagnosis Date  . Diabetes mellitus     type 2  . Hypertension   . Peripheral arterial disease   . Hyperlipidemia   . CAD (coronary artery disease)     s/p CABG x 29 Nov 2011 with LIMA to LAD, SVG to OM1 and distal LCX, SVG to ramus intermediate  . PAF (paroxysmal atrial fibrillation)     post op atrial fib 11/2011; short course of amiodarone; stopped 01/25/12  . Right bundle branch block    Past Surgical History  Procedure Laterality Date  . Cholecystectomy  1999  . Cardiac catheterization  01/16/1999    Est. EF of 65% -- Nonobstruction atherosclerotic coronary artery disease -- Normal left ventricular function      . Cardiac electrophysiology study and ablation  05/21/1999    Normal sinus funtion -- Mildly prolonged interatrial conduction times -- Normal A-V node funtion -- Normal His Purkinje system function -- fNo accessory pathway --  No inducible ventricular tachycardia in the presence of the or in the absence of isoproterenol with programmed stimulation or with burst pacing -- Nikki Dom, M.D. FACC surgan          . Coronary artery bypass graft  12/10/2011    Procedure: CORONARY ARTERY BYPASS GRAFTING (CABG);  Surgeon: Tharon Aquas Adelene Idler, MD;  Location: [place];  Service: Open Heart Surgery;  Laterality: N/A;  Coronary Artery bypass graft on pump times four utilizing left internal mammary artery and left saphenous vein harvested endoscopically  . Left heart catheterization with coronary angiogram N/A 12/09/2011    Procedure: LEFT HEART CATHETERIZATION WITH CORONARY ANGIOGRAM;  Surgeon: Peter M Martinique, MD;  Location: [facility] CATH LAB;  Service: Cardiovascular;  Laterality: N/A;   Family History  Problem Relation Age of Onset  . Stroke Mother 32    Cerebellar hemorrhage   History  Substance Use Topics  . Smoking status: Former Smoker -- 1.50 packs/day for 20 years    Types: Cigarettes    Quit date: 05/27/1979  . Smokeless tobacco: Former Systems developer  . Alcohol Use: 2.4 oz/week    4 Shots of liquor per week     Comment: 4 "canadian clubs each night"    Review of Systems  Constitutional: Positive for diaphoresis. Negative for fever, chills and fatigue.  HENT: Negative for congestion, rhinorrhea and sneezing.   Eyes: Negative.   Respiratory: Positive for shortness of breath. Negative for  cough and chest tightness.   Cardiovascular: Negative for chest pain and leg swelling.  Gastrointestinal: Positive for nausea. Negative for vomiting, abdominal pain, diarrhea and blood in stool.  Genitourinary: Negative for frequency, hematuria, flank pain and difficulty urinating.  Musculoskeletal: Negative for back pain and arthralgias.  Skin: Negative for rash.  Neurological: Positive for weakness (generalized weakness). Negative for dizziness, speech difficulty, numbness and headaches.      Allergies  Statins  Home Medications    Prior to Admission medications   Medication Sig Start Date End Date Taking? Authorizing Provider  amLODipine (NORVASC) 10 MG tablet Take 10 mg by mouth daily.   Yes Historical Provider, MD  aspirin 325 MG tablet Take 325 mg by mouth daily.   Yes Historical Provider, MD  cilostazol (PLETAL) 100 MG tablet TAKE 1 TABLET TWICE A DAY 01/29/15  Yes Peter M Martinique, MD  DUREZOL 0.05 % EMUL Place 1 drop into the right eye daily. Use for 1 month after procedure 03/10/15  Yes Historical Provider, MD  gentamicin (GARAMYCIN) 0.3 % ophthalmic solution Place 1 drop into the right eye 4 (four) times daily. For 1 week after procedure 03/10/15  Yes Historical Provider, MD  ILEVRO 0.3 % ophthalmic suspension Place 1 drop into the right eye daily. For 2 weeks after procedure 03/10/15  Yes Historical Provider, MD  lisinopril (PRINIVIL,ZESTRIL) 40 MG tablet Take 1 tablet (40 mg total) by mouth daily. 12/19/13  Yes Peter M Martinique, MD  metFORMIN (GLUCOPHAGE) 1000 MG tablet Take 1 tablet (1,000 mg total) by mouth 2 (two) times daily with a meal. 12/15/11  Yes Donielle Liston Alba, PA-C  metoprolol (LOPRESSOR) 100 MG tablet Take 1 tablet by mouth daily. 02/11/15  Yes Historical Provider, MD   BP 141/57 mmHg  Pulse 71  Temp(Src) 98.4 F (36.9 C) (Oral)  Resp 18  Ht 5\' 10"  (1.778 m)  Wt 190 lb (86.183 kg)  BMI 27.26 kg/m2  SpO2 95% Physical Exam  Constitutional: He is oriented to person, place, and time. He appears well-developed and well-nourished.  HENT:  Head: Normocephalic and atraumatic.  Eyes: Pupils are equal, round, and reactive to light.  Neck: Normal range of motion. Neck supple.  Cardiovascular: Normal rate, regular rhythm and normal heart sounds.   Pulmonary/Chest: Effort normal and breath sounds normal. No respiratory distress. He has no wheezes. He has no rales. He exhibits no tenderness.  Abdominal: Soft. Bowel sounds are normal. There is no tenderness. There is no rebound and no guarding.   Musculoskeletal: Normal range of motion. He exhibits no edema.  Lymphadenopathy:    He has no cervical adenopathy.  Neurological: He is alert and oriented to person, place, and time. He has normal strength. No cranial nerve deficit or sensory deficit. GCS eye subscore is 4. GCS verbal subscore is 5. GCS motor subscore is 6.  Skin: Skin is warm and dry. No rash noted.  Psychiatric: He has a normal mood and affect.    ED Course  Procedures (including critical care time) Labs Review Results for orders placed or performed during the hospital encounter of 03/31/15  CBC with Differential  Result Value Ref Range   WBC 12.0 (H) 4.0 - 10.5 K/uL   RBC 4.28 4.22 - 5.81 MIL/uL   Hemoglobin 14.5 13.0 - 17.0 g/dL   HCT 39.7 39.0 - 52.0 %   MCV 92.8 78.0 - 100.0 fL   MCH 33.9 26.0 - 34.0 pg   MCHC 36.5 (H) 30.0 - 36.0 g/dL  RDW 13.6 11.5 - 15.5 %   Platelets 137 (L) 150 - 400 K/uL   Neutrophils Relative % 82 (H) 43 - 77 %   Neutro Abs 9.8 (H) 1.7 - 7.7 K/uL   Lymphocytes Relative 9 (L) 12 - 46 %   Lymphs Abs 1.1 0.7 - 4.0 K/uL   Monocytes Relative 8 3 - 12 %   Monocytes Absolute 0.9 0.1 - 1.0 K/uL   Eosinophils Relative 1 0 - 5 %   Eosinophils Absolute 0.1 0.0 - 0.7 K/uL   Basophils Relative 0 0 - 1 %   Basophils Absolute 0.0 0.0 - 0.1 K/uL  I-stat Chem 8, ED  Result Value Ref Range   Sodium 137 135 - 145 mmol/L   Potassium 4.0 3.5 - 5.1 mmol/L   Chloride 102 101 - 111 mmol/L   BUN 8 6 - 20 mg/dL   Creatinine, Ser 1.50 (H) 0.61 - 1.24 mg/dL   Glucose, Bld 149 (H) 65 - 99 mg/dL   Calcium, Ion 1.20 1.13 - 1.30 mmol/L   TCO2 19 0 - 100 mmol/L   Hemoglobin 14.6 13.0 - 17.0 g/dL   HCT 43.0 39.0 - 52.0 %  I-stat troponin, ED  Result Value Ref Range   Troponin i, poc 0.01 0.00 - 0.08 ng/mL   Comment 3           Dg Chest Port 1 View  04/01/2015   CLINICAL DATA:  Weakness.  Code STEMI.  EXAM: PORTABLE CHEST - 1 VIEW  COMPARISON:  01/05/2012  FINDINGS: Patient is post median sternotomy.  The cardiomediastinal contours are normal. The lungs are clear. Pulmonary vasculature is normal. No consolidation, pleural effusion, or pneumothorax. No acute osseous abnormalities are seen.  IMPRESSION: Post median sternotomy.  No acute pulmonary process.   Electronically Signed   By: Jeb Levering M.D.   On: 04/01/2015 00:18      Imaging Review Dg Chest Port 1 View  04/01/2015   CLINICAL DATA:  Weakness.  Code STEMI.  EXAM: PORTABLE CHEST - 1 VIEW  COMPARISON:  01/05/2012  FINDINGS: Patient is post median sternotomy. The cardiomediastinal contours are normal. The lungs are clear. Pulmonary vasculature is normal. No consolidation, pleural effusion, or pneumothorax. No acute osseous abnormalities are seen.  IMPRESSION: Post median sternotomy.  No acute pulmonary process.   Electronically Signed   By: Jeb Levering M.D.   On: 04/01/2015 00:18     EKG Interpretation   Date/Time:  Monday March 31 2015 22:16:04 EDT Ventricular Rate:  87 PR Interval:  184 QRS Duration: 105 QT Interval:  381 QTC Calculation: 458 R Axis:   -71 Text Interpretation:  Sinus rhythm Left atrial enlargement Abnormal R-wave  progression, late transition Left ventricular hypertrophy Probable  inferior infarct, acute Abnrm T, consider ischemia, anterolateral lds  Baseline wander in lead(s) V6 ** ** ACUTE MI / STEMI ** ** Confirmed by  Amery Minasyan  MD, Lamira Borin ([id number]) on 03/31/2015 11:04:03 PM      MDM   Final diagnoses:  Near syncope    Pt presents with near syncopal episode, generalized weakness, diaphoresis and nausea and some shortness of breath. He states he didn't have any unilateral weakness or other symptoms that would be more suggestive of a stroke. He did state that he took a laxative earlier today for some constipation. He's symptom-free now other than some mild epigastric discomfort. His EKG on arrival showed ST elevation inferiorly with some ST depression and T-wave inversion laterally. I compared it  to his  prior EKG from September 2015 and the ST elevation appears new as compared to that EKG.  He was given ASA and heparin bolus.  A code STEMI was activated. The cardiology fellow, Dr. Claiborne Billings  did come down and see the patient. He did not feel that the EKG was significantly different from his prior EKG and canceled the code STEMI.  Dr. Claiborne Billings recommends admission to medicine for monitoring and serial enzymes.  Will consult unassigned medicine.  Pt's PCP is [facility]      Malvin Johns, MD 04/01/15 [phone]

## 2015-04-01 ENCOUNTER — Observation Stay (HOSPITAL_COMMUNITY): Payer: Medicare Other

## 2015-04-01 ENCOUNTER — Inpatient Hospital Stay (HOSPITAL_COMMUNITY): Payer: Medicare Other

## 2015-04-01 ENCOUNTER — Other Ambulatory Visit: Payer: Self-pay | Admitting: Cardiology

## 2015-04-01 DIAGNOSIS — R079 Chest pain, unspecified: Secondary | ICD-10-CM

## 2015-04-01 DIAGNOSIS — R42 Dizziness and giddiness: Secondary | ICD-10-CM

## 2015-04-01 DIAGNOSIS — I10 Essential (primary) hypertension: Secondary | ICD-10-CM

## 2015-04-01 DIAGNOSIS — R55 Syncope and collapse: Secondary | ICD-10-CM | POA: Diagnosis present

## 2015-04-01 DIAGNOSIS — Z951 Presence of aortocoronary bypass graft: Secondary | ICD-10-CM

## 2015-04-01 DIAGNOSIS — E1142 Type 2 diabetes mellitus with diabetic polyneuropathy: Secondary | ICD-10-CM | POA: Diagnosis not present

## 2015-04-01 DIAGNOSIS — I2510 Atherosclerotic heart disease of native coronary artery without angina pectoris: Secondary | ICD-10-CM | POA: Diagnosis not present

## 2015-04-01 DIAGNOSIS — R5383 Other fatigue: Secondary | ICD-10-CM

## 2015-04-01 DIAGNOSIS — N182 Chronic kidney disease, stage 2 (mild): Secondary | ICD-10-CM | POA: Diagnosis present

## 2015-04-01 DIAGNOSIS — I4510 Unspecified right bundle-branch block: Secondary | ICD-10-CM | POA: Diagnosis present

## 2015-04-01 DIAGNOSIS — I471 Supraventricular tachycardia: Secondary | ICD-10-CM

## 2015-04-01 DIAGNOSIS — I209 Angina pectoris, unspecified: Secondary | ICD-10-CM | POA: Diagnosis present

## 2015-04-01 DIAGNOSIS — E785 Hyperlipidemia, unspecified: Secondary | ICD-10-CM

## 2015-04-01 DIAGNOSIS — I739 Peripheral vascular disease, unspecified: Secondary | ICD-10-CM

## 2015-04-01 HISTORY — DX: Syncope and collapse: R55

## 2015-04-01 LAB — URINALYSIS, ROUTINE W REFLEX MICROSCOPIC
Bilirubin Urine: NEGATIVE
Glucose, UA: NEGATIVE mg/dL
HGB URINE DIPSTICK: NEGATIVE
KETONES UR: NEGATIVE mg/dL
NITRITE: NEGATIVE
PH: 7 (ref 5.0–8.0)
Protein, ur: 30 mg/dL — AB
SPECIFIC GRAVITY, URINE: 1.008 (ref 1.005–1.030)
Urobilinogen, UA: 1 mg/dL (ref 0.0–1.0)

## 2015-04-01 LAB — PROTIME-INR
INR: 1.09 (ref 0.00–1.49)
Prothrombin Time: 14.3 seconds (ref 11.6–15.2)

## 2015-04-01 LAB — GLUCOSE, CAPILLARY
GLUCOSE-CAPILLARY: 136 mg/dL — AB (ref 65–99)
Glucose-Capillary: 145 mg/dL — ABNORMAL HIGH (ref 65–99)
Glucose-Capillary: 138 mg/dL — ABNORMAL HIGH (ref 65–99)

## 2015-04-01 LAB — COMPREHENSIVE METABOLIC PANEL
ALT: 24 U/L (ref 17–63)
AST: 23 U/L (ref 15–41)
Albumin: 3.7 g/dL (ref 3.5–5.0)
Alkaline Phosphatase: 38 U/L (ref 38–126)
Anion gap: 8 (ref 5–15)
BUN: 7 mg/dL (ref 6–20)
CALCIUM: 8.8 mg/dL — AB (ref 8.9–10.3)
CO2: 24 mmol/L (ref 22–32)
Chloride: 103 mmol/L (ref 101–111)
Creatinine, Ser: 1.26 mg/dL — ABNORMAL HIGH (ref 0.61–1.24)
GFR calc Af Amer: >60 mL/min (ref >60)
GFR calc non Af Amer: 54 mL/min — ABNORMAL LOW (ref >60)
GLUCOSE: 153 mg/dL — AB (ref 65–99)
Potassium: 4.4 mmol/L (ref 3.5–5.1)
SODIUM: 135 mmol/L (ref 135–145)
Total Bilirubin: 0.7 mg/dL (ref 0.3–1.2)
Total Protein: 6 g/dL — ABNORMAL LOW (ref 6.5–8.1)

## 2015-04-01 LAB — CBC
HEMATOCRIT: 33.7 % — AB (ref 39.0–52.0)
Hemoglobin: 11.9 g/dL — ABNORMAL LOW (ref 13.0–17.0)
MCH: 32.7 pg (ref 26.0–34.0)
MCHC: 35.3 g/dL (ref 30.0–36.0)
MCV: 92.6 fL (ref 78.0–100.0)
PLATELETS: 129 10*3/uL — AB (ref 150–400)
RBC: 3.64 MIL/uL — AB (ref 4.22–5.81)
RDW: 13.6 % (ref 11.5–15.5)
WBC: 7.9 10*3/uL (ref 4.0–10.5)

## 2015-04-01 LAB — TROPONIN I: Troponin I: <0.03 ng/mL (ref <0.031)

## 2015-04-01 LAB — URINE MICROSCOPIC-ADD ON

## 2015-04-01 MED ORDER — ONDANSETRON HCL 4 MG PO TABS
4.0000 mg | ORAL_TABLET | Freq: Four times a day (QID) | ORAL | Status: DC | PRN
Start: 2015-04-01 — End: 2015-04-01

## 2015-04-01 MED ORDER — CILOSTAZOL 100 MG PO TABS
100.0000 mg | ORAL_TABLET | Freq: Two times a day (BID) | ORAL | Status: DC
  Administered 2015-04-01: 100 mg via ORAL
  Filled 2015-04-01 (×2): qty 1

## 2015-04-01 MED ORDER — INSULIN ASPART 100 UNIT/ML ~~LOC~~ SOLN: 0.0000 [IU] | Freq: Three times a day (TID) | SUBCUTANEOUS | Status: DC

## 2015-04-01 MED ORDER — SODIUM CHLORIDE 0.9 % IV SOLN
INTRAVENOUS | Status: DC
  Administered 2015-04-01: 05:00:00 via INTRAVENOUS

## 2015-04-01 MED ORDER — SODIUM CHLORIDE 0.9 % IJ SOLN
3.0000 mL | Freq: Two times a day (BID) | INTRAMUSCULAR | Status: DC
  Administered 2015-04-01 (×2): 3 mL via INTRAVENOUS

## 2015-04-01 MED ORDER — ACETAMINOPHEN 325 MG PO TABS: 650.0000 mg | ORAL_TABLET | Freq: Four times a day (QID) | ORAL | Status: DC | PRN

## 2015-04-01 MED ORDER — AMLODIPINE BESYLATE 10 MG PO TABS
10.0000 mg | ORAL_TABLET | Freq: Every day | ORAL | Status: DC
  Administered 2015-04-01: 10 mg via ORAL
  Filled 2015-04-01: qty 1

## 2015-04-01 MED ORDER — HEPARIN SODIUM (PORCINE) 5000 UNIT/ML IJ SOLN
5000.0000 [IU] | Freq: Three times a day (TID) | INTRAMUSCULAR | Status: DC
  Filled 2015-04-01 (×3): qty 1

## 2015-04-01 MED ORDER — INSULIN ASPART 100 UNIT/ML ~~LOC~~ SOLN: 0.0000 [IU] | Freq: Every day | SUBCUTANEOUS | Status: DC

## 2015-04-01 MED ORDER — LISINOPRIL 40 MG PO TABS
40.0000 mg | ORAL_TABLET | Freq: Every day | ORAL | Status: DC
  Administered 2015-04-01: 40 mg via ORAL
  Filled 2015-04-01: qty 1

## 2015-04-01 MED ORDER — ONDANSETRON HCL 4 MG/2ML IJ SOLN: 4.0000 mg | Freq: Four times a day (QID) | INTRAMUSCULAR | Status: DC | PRN

## 2015-04-01 MED ORDER — ASPIRIN 325 MG PO TABS
325.0000 mg | ORAL_TABLET | Freq: Every day | ORAL | Status: DC
  Administered 2015-04-01: 325 mg via ORAL
  Filled 2015-04-01: qty 1

## 2015-04-01 MED ORDER — PREDNISOLONE ACETATE 1 % OP SUSP
1.0000 [drp] | Freq: Every day | OPHTHALMIC | Status: DC
  Administered 2015-04-01: 1 [drp] via OPHTHALMIC
  Filled 2015-04-01: qty 1

## 2015-04-01 MED ORDER — TOBRAMYCIN 0.3 % OP SOLN
1.0000 [drp] | Freq: Four times a day (QID) | OPHTHALMIC | Status: DC
  Administered 2015-04-01 (×2): 1 [drp] via OPHTHALMIC
  Filled 2015-04-01: qty 5

## 2015-04-01 MED ORDER — METOPROLOL TARTRATE 50 MG PO TABS
50.0000 mg | ORAL_TABLET | Freq: Two times a day (BID) | ORAL | Status: DC
  Administered 2015-04-01 (×2): 50 mg via ORAL
  Filled 2015-04-01 (×3): qty 1

## 2015-04-01 MED ORDER — KETOROLAC TROMETHAMINE 0.5 % OP SOLN
1.0000 [drp] | Freq: Every day | OPHTHALMIC | Status: DC
  Administered 2015-04-01: 1 [drp] via OPHTHALMIC
  Filled 2015-04-01: qty 3

## 2015-04-01 MED ORDER — ACETAMINOPHEN 650 MG RE SUPP: 650.0000 mg | Freq: Four times a day (QID) | RECTAL | Status: DC | PRN

## 2015-04-01 NOTE — Progress Notes (Signed)
Patient is discharged from room 3E22 at this time. Alert and in stable condition. IV site d/c'd as well as tele. Instructions read to patient and understanding verbalized. Left unit via wheelchair with all belongings and family at side.

## 2015-04-01 NOTE — Discharge Instructions (Signed)
Near-Syncope Near-syncope (commonly known as near fainting) is sudden weakness, dizziness, or feeling like you might pass out. During an episode of near-syncope, you may also develop pale skin, have tunnel vision, or feel sick to your stomach (nauseous). Near-syncope may occur when getting up after sitting or while standing for a long time. It is caused by a sudden decrease in blood flow to the brain. This decrease can result from various causes or triggers, most of which are not serious. However, because near-syncope can sometimes be a sign of something serious, a medical evaluation is required. The specific cause is often not determined. HOME CARE INSTRUCTIONS  Monitor your condition for any changes. The following actions may help to alleviate any discomfort you are experiencing:  Have someone stay with you until you feel stable.  Lie down right away and prop your feet up if you start feeling like you might faint. Breathe deeply and steadily. Wait until all the symptoms have passed. Most of these episodes last only a few minutes. You may feel tired for several hours.   Drink enough fluids to keep your urine clear or pale yellow.   If you are taking blood pressure or heart medicine, get up slowly when seated or lying down. Take several minutes to sit and then stand. This can reduce dizziness.  Follow up with your health care provider as directed. SEEK IMMEDIATE MEDICAL CARE IF:   You have a severe headache.   You have unusual pain in the chest, abdomen, or back.   You are bleeding from the mouth or rectum, or you have black or tarry stool.   You have an irregular or very fast heartbeat.   You have repeated fainting or have seizure-like jerking during an episode.   You faint when sitting or lying down.   You have confusion.   You have difficulty walking.   You have severe weakness.   You have vision problems.  MAKE SURE YOU:   Understand these instructions.  Will  watch your condition.  Will get help right away if you are not doing well or get worse. Document Released: 10/11/2005 Document Revised: 10/16/2013 Document Reviewed: 03/16/2013 ExitCare Patient Information 2015 [employer]. This information is not intended to replace advice given to you by your health care provider. Make sure you discuss any questions you have with your health care provider.  

## 2015-04-01 NOTE — Progress Notes (Signed)
Subjective:  Denies SSCP, palpitations or Dyspnea Wants to go home   Objective:  Filed Vitals:   04/01/15 0100 04/01/15 0115 04/01/15 0205 04/01/15 0513  BP: 128/58 141/60 150/59 140/58  Pulse: 71 73 72 66  Temp:   97.6 F (36.4 C) 98.5 F (36.9 C)  TempSrc:   Oral Oral  Resp: 23 17 18 12   Height:   5\' 10"  (1.778 m)   Weight:   83.598 kg (184 lb 4.8 oz)   SpO2: 92% 90% 98% 97%    Intake/Output from previous day:  Intake/Output Summary (Last 24 hours) at 04/01/15 1136 Last data filed at 04/01/15 1046  Gross per 24 hour  Intake      0 ml  Output    675 ml  Net   -675 ml    Physical Exam: Affect appropriate Healthy:  appears stated age HEENT: normal Neck supple with no adenopathy JVP normal no bruits no thyromegaly Lungs clear with no wheezing and good diaphragmatic motion Heart:  S1/S2 no murmur, no rub, gallop or click PMI normal Abdomen: benighn, BS positve, no tenderness, no AAA no bruit.  No HSM or HJR Distal pulses intact with no bruits No edema Neuro non-focal Skin warm and dry No muscular weakness   Lab Results: Basic Metabolic Panel:  Recent Labs  03/31/15 2241 04/01/15 0344  NA 137 135  K 4.0 4.4  CL 102 103  CO2  --  24  GLUCOSE 149* 153*  BUN 8 7  CREATININE 1.50* 1.26*  CALCIUM  --  8.8*   Liver Function Tests:  Recent Labs  04/01/15 0344  AST 23  ALT 24  ALKPHOS 38  BILITOT 0.7  PROT 6.0*  ALBUMIN 3.7  CBC:  Recent Labs  03/31/15 2235 03/31/15 2241 04/01/15 0344  WBC 12.0*  --  7.9  NEUTROABS 9.8*  --   --   HGB 14.5 14.6 11.9*  HCT 39.7 43.0 33.7*  MCV 92.8  --  92.6  PLT 137*  --  129*   Cardiac Enzymes:  Recent Labs  04/01/15 0344 04/01/15 0937  TROPONINI <0.03 <0.03    Imaging: Mri Brain Without Contrast  04/01/2015   CLINICAL DATA:  Near-syncope. Generalized weakness, dizziness, and diaphoresis. Recent cataract surgery.  EXAM: MRI HEAD WITHOUT CONTRAST  TECHNIQUE: Multiplanar, multiecho pulse  sequences of the brain and surrounding structures were obtained without intravenous contrast.  COMPARISON:  None.  FINDINGS: There is no evidence of acute infarct, intracranial hemorrhage, mass, midline shift, or extra-axial fluid collection. There is mild to moderate generalized cerebral atrophy. Small foci of T2 hyperintensity scattered in the cerebral white matter bilaterally are nonspecific but compatible with minimal chronic small vessel ischemic disease. Small, chronic infarcts are present in both cerebellar hemispheres.  Prior right cataract extraction is noted. Prominent susceptibility artifact is noted which appears to originate from the soft tissues lateral to the left orbit, of uncertain etiology. There are small bilateral mastoid effusions. Paranasal sinuses are clear. Major intracranial vascular flow voids are preserved.  IMPRESSION: 1. No acute intracranial abnormality. 2. Chronic cerebellar infarcts. Minimal chronic small vessel ischemic disease in the cerebral white matter.   Electronically Signed   By: Logan Bores   On: 04/01/2015 08:19   Dg Chest Port 1 View  04/01/2015   CLINICAL DATA:  Weakness.  Code STEMI.  EXAM: PORTABLE CHEST - 1 VIEW  COMPARISON:  01/05/2012  FINDINGS: Patient is post median sternotomy. The cardiomediastinal contours are normal. The  lungs are clear. Pulmonary vasculature is normal. No consolidation, pleural effusion, or pneumothorax. No acute osseous abnormalities are seen.  IMPRESSION: Post median sternotomy.  No acute pulmonary process.   Electronically Signed   By: Jeb Levering M.D.   On: 04/01/2015 00:18    Cardiac Studies:  ECG:  SR LAD poor R wave progression   Telemetry:  NSR no arrhythmia   Echo:   Medications:   . amLODipine  10 mg Oral Daily  . aspirin  325 mg Oral Daily  . cilostazol  100 mg Oral BID  . heparin  5,000 Units Subcutaneous 3 times per day  . insulin aspart  0-5 Units Subcutaneous QHS  . insulin aspart  0-9 Units Subcutaneous  TID WC  . ketorolac  1 drop Right Eye Daily  . lisinopril  40 mg Oral Daily  . metoprolol  50 mg Oral BID  . prednisoLONE acetate  1 drop Right Eye Daily  . sodium chloride  3 mL Intravenous Q12H  . tobramycin  1 drop Right Eye 4 times per day     . sodium chloride 50 mL/hr at 04/01/15 0501    Assessment/Plan:  CAD:  Stable no pain r/o no acute ECG changes.  Continue ASA And beta blocker Outpatient f/u Dr Martinique Probably arrange lexiscan myovue  Neuropathy makes walking on treadmill difficult  Ok to d/c home   Jenkins Rouge 04/01/2015, 11:36 AM

## 2015-04-01 NOTE — Discharge Summary (Signed)
Physician Discharge Summary  Steve [name]. [id number] DOB: 1941-07-14 DOA: 03/31/2015  PCP: Leonard Downing, MD  Admit date: 03/31/2015 Discharge date: 04/01/2015  Time spent: 40 minutes  Recommendations for Outpatient Follow-up:  Patient will be discharged to home.  Patient will need to follow up with primary care provider within one week of discharge.  Patient will also need to follow up with cardiology, Dr. Martinique. Patient should continue medications as prescribed.  Patient should follow a Heart healthy/carb modified diet.   Discharge Diagnoses:  Near syncope History of coronary artery disease/abnormal EKG Diabetes mellitus, type II Peripheral vascular disease  Recent cataract surgery  Discharge Condition: Stable  Diet recommendation: Heart healthy/carb modified  Filed Weights   03/31/15 2220 04/01/15 0205  Weight: 86.183 kg (190 lb) 83.598 kg (184 lb 4.8 oz)    History of present illness:  On 04/01/2015 by Dr. Albin Fischer. is a 74 y.o. male with Past medical history of diabetes mellitus type 2, hypertension, peripheral vascular disease, coronary artery disease, atrial fibrillation. The patient is presenting with complaints of sudden onset of generalized weakness dizziness as well as diaphoresis. Patient mentions that he had a cataract surgery earlier in the morning which was not complicated and returned home. He had his dinner and he was doing fine. While he was sitting he suddenly felt dizzy and lightheaded as well as a pleasant nauseated and had significant sweating. He had difficulty ambulating and was stumbling all over. He did not have any focal deficit did not have any chest pain did not have any palpitation. Did not have any shortness of breath as well no abdominal pain no diarrhea or constipation. He denies any burning urination. On his arrival at code STEMI was called which was later CANCELED BY CARDIOLOGY. The patient is coming from home. And  at his baseline independent for most of his ADL.  Hospital Course:  Near Syncope -Denies lightheadedness or dizziness -Possibly due to recent cataract surgery -Orthostatic vitals fine -Cardiology consulted and will follow-up outpatient  History of CAD/Abnormal EKG -No complaints of chest pain -Continue aspirin, beta blocker, lisinopril -Troponins cycled and negative -Follow up with Dr. Martinique for possible Lexi scan Myoview -Patient has difficulty walking on the treadmill due to his neuropathy  Diabetes mellitus -Continue metformin  Peripheral vascular disease -Continue Pletal  Recent cataract surgery  -Continue eye drops  Procedures: None  Consultations: Cardiology  Discharge Exam: Filed Vitals:   04/01/15 0513  BP: 140/58  Pulse: 66  Temp: 98.5 F (36.9 C)  Resp: 12     General: Well developed, well nourished, NAD, appears stated age  HEENT: NCAT, PERRLA, EOMI, Anicteic Sclera, mucous membranes moist.  Neck: Supple, no JVD, no masses  Cardiovascular: S1 S2 auscultated, no rubs, murmurs or gallops. Regular rate and rhythm.  Respiratory: Clear to auscultation bilaterally with equal chest rise  Abdomen: Soft, nontender, nondistended, + bowel sounds  Extremities: warm dry without cyanosis clubbing or edema  Neuro: AAOx3, cranial nerves grossly intact. Strength 5/5 in patient's upper and lower extremities bilaterally  Skin: Without rashes exudates or nodules  Psych: Normal affect and demeanor with intact judgement and insight  Discharge Instructions      Discharge Instructions    Discharge instructions    Complete by:  As directed   Patient will be discharged to home.  Patient will need to follow up with primary care provider within one week of discharge.  Patient will also need to follow up with  cardiology, Dr. Martinique. Patient should continue medications as prescribed.  Patient should follow a Heart healthy/carb modified diet.              Medication List    TAKE these medications        amLODipine 10 MG tablet  Commonly known as:  NORVASC  Take 10 mg by mouth daily.     aspirin 325 MG tablet  Take 325 mg by mouth daily.     cilostazol 100 MG tablet  Commonly known as:  PLETAL  TAKE 1 TABLET TWICE A DAY     DUREZOL 0.05 % Emul  Generic drug:  Difluprednate  Place 1 drop into the right eye daily. Use for 1 month after procedure     gentamicin 0.3 % ophthalmic solution  Commonly known as:  GARAMYCIN  Place 1 drop into the right eye 4 (four) times daily. For 1 week after procedure     ILEVRO 0.3 % ophthalmic suspension  Generic drug:  nepafenac  Place 1 drop into the right eye daily. For 2 weeks after procedure     lisinopril 40 MG tablet  Commonly known as:  PRINIVIL,ZESTRIL  Take 1 tablet (40 mg total) by mouth daily.     metFORMIN 1000 MG tablet  Commonly known as:  GLUCOPHAGE  Take 1 tablet (1,000 mg total) by mouth 2 (two) times daily with a meal.     metoprolol 100 MG tablet  Commonly known as:  LOPRESSOR  Take 1 tablet by mouth daily.       Allergies  Allergen Reactions  . Statins Other (See Comments)    Myalgias, muscle weakness   Follow-up Information    Follow up with Peter Martinique, MD On 04/09/2015.   Specialty:  Cardiology   Why:  Wednesday @ 11:30 AM with Ignacia Bayley. Please go to the [facility] location. Spoke with [employer] information:   [address] [place] [address] [phone]       Follow up with Leonard Downing, MD. Schedule an appointment as soon as possible for a visit in 1 week.   Specialty:  Family Medicine   Why:  Hospital follow up   Contact information:   [place] [address] [phone]        The results of significant diagnostics from this hospitalization (including imaging, microbiology, ancillary and laboratory) are listed below for reference.    Significant Diagnostic Studies: Mri Brain Without  Contrast  04/01/2015   CLINICAL DATA:  Near-syncope. Generalized weakness, dizziness, and diaphoresis. Recent cataract surgery.  EXAM: MRI HEAD WITHOUT CONTRAST  TECHNIQUE: Multiplanar, multiecho pulse sequences of the brain and surrounding structures were obtained without intravenous contrast.  COMPARISON:  None.  FINDINGS: There is no evidence of acute infarct, intracranial hemorrhage, mass, midline shift, or extra-axial fluid collection. There is mild to moderate generalized cerebral atrophy. Small foci of T2 hyperintensity scattered in the cerebral white matter bilaterally are nonspecific but compatible with minimal chronic small vessel ischemic disease. Small, chronic infarcts are present in both cerebellar hemispheres.  Prior right cataract extraction is noted. Prominent susceptibility artifact is noted which appears to originate from the soft tissues lateral to the left orbit, of uncertain etiology. There are small bilateral mastoid effusions. Paranasal sinuses are clear. Major intracranial vascular flow voids are preserved.  IMPRESSION: 1. No acute intracranial abnormality. 2. Chronic cerebellar infarcts. Minimal chronic small vessel ischemic disease in the cerebral white matter.   Electronically  Signed   By: Logan Bores   On: 04/01/2015 08:19   Dg Chest Port 1 View  04/01/2015   CLINICAL DATA:  Weakness.  Code STEMI.  EXAM: PORTABLE CHEST - 1 VIEW  COMPARISON:  01/05/2012  FINDINGS: Patient is post median sternotomy. The cardiomediastinal contours are normal. The lungs are clear. Pulmonary vasculature is normal. No consolidation, pleural effusion, or pneumothorax. No acute osseous abnormalities are seen.  IMPRESSION: Post median sternotomy.  No acute pulmonary process.   Electronically Signed   By: Jeb Levering M.D.   On: 04/01/2015 00:18    Microbiology: No results found for this or any previous visit (from the past 240 hour(s)).   Labs: Basic Metabolic Panel:  Recent Labs Lab  03/31/15 2241 04/01/15 0344  NA 137 135  K 4.0 4.4  CL 102 103  CO2  --  24  GLUCOSE 149* 153*  BUN 8 7  CREATININE 1.50* 1.26*  CALCIUM  --  8.8*   Liver Function Tests:  Recent Labs Lab 04/01/15 0344  AST 23  ALT 24  ALKPHOS 38  BILITOT 0.7  PROT 6.0*  ALBUMIN 3.7   No results for input(s): LIPASE, AMYLASE in the last 168 hours. No results for input(s): AMMONIA in the last 168 hours. CBC:  Recent Labs Lab 03/31/15 2235 03/31/15 2241 04/01/15 0344  WBC 12.0*  --  7.9  NEUTROABS 9.8*  --   --   HGB 14.5 14.6 11.9*  HCT 39.7 43.0 33.7*  MCV 92.8  --  92.6  PLT 137*  --  129*   Cardiac Enzymes:  Recent Labs Lab 04/01/15 0344 04/01/15 0937  TROPONINI <0.03 <0.03   BNP: BNP (last 3 results) No results for input(s): BNP in the last 8760 hours.  ProBNP (last 3 results) No results for input(s): PROBNP in the last 8760 hours.  CBG:  Recent Labs Lab 04/01/15 0217 04/01/15 0654 04/01/15 1128  GLUCAP 136* 145* 138*       Signed:  Kaylise Blakeley  [facility] 04/01/2015, 2:14 PM

## 2015-04-01 NOTE — H&P (Signed)
[facility] History and Physical  Patient: Steve [name].  MRN: [id number]  DOB: 1940/12/09  DOS: the patient was seen and examined on 04/01/2015 PCP: Leonard Downing, MD  Referring physician: Dr. Lenor Derrick Chief Complaint: Dizziness  HPI: Reason Blan. is a 74 y.o. male with Past medical history of diabetes mellitus type 2, hypertension, peripheral vascular disease, coronary artery disease, atrial fibrillation. The patient is presenting with complaints of sudden onset of generalized weakness dizziness as well as diaphoresis. Patient mentions that he had a cataract surgery earlier in the morning which was not complicated and returned home. He had his dinner and he was doing fine. While he was sitting he suddenly felt dizzy and lightheaded as well as a pleasant nauseated and had significant sweating. He had difficulty ambulating and was stumbling all over. He did not have any focal deficit did not have any chest pain did not have any palpitation. Did not have any shortness of breath as well no abdominal pain no diarrhea or constipation. He denies any burning urination. On his arrival at code STEMI was called which was later CANCELED BY CARDIOLOGY.  The patient is coming from home. And at his baseline independent for most of his ADL.  Review of Systems: as mentioned in the history of present illness.  A comprehensive review of the other systems is negative.  Past Medical History  Diagnosis Date  . Diabetes mellitus     type 2  . Hypertension   . Peripheral arterial disease   . Hyperlipidemia   . CAD (coronary artery disease)     s/p CABG x 29 Nov 2011 with LIMA to LAD, SVG to OM1 and distal LCX, SVG to ramus intermediate  . PAF (paroxysmal atrial fibrillation)     post op atrial fib 11/2011; short course of amiodarone; stopped 01/25/12  . Right bundle branch block    Past Surgical History  Procedure Laterality Date  . Cholecystectomy  1999  . Cardiac catheterization   01/16/1999    Est. EF of 65% -- Nonobstruction atherosclerotic coronary artery disease -- Normal left ventricular function      . Cardiac electrophysiology study and ablation  05/21/1999    Normal sinus funtion -- Mildly prolonged interatrial conduction times -- Normal A-V node funtion -- Normal His Purkinje system function -- fNo accessory pathway -- No inducible ventricular tachycardia in the presence of the or in the absence of isoproterenol with programmed stimulation or with burst pacing -- Nikki Dom, M.D. FACC surgan          . Coronary artery bypass graft  12/10/2011    Procedure: CORONARY ARTERY BYPASS GRAFTING (CABG);  Surgeon: Tharon Aquas Adelene Idler, MD;  Location: [place];  Service: Open Heart Surgery;  Laterality: N/A;  Coronary Artery bypass graft on pump times four utilizing left internal mammary artery and left saphenous vein harvested endoscopically  . Left heart catheterization with coronary angiogram N/A 12/09/2011    Procedure: LEFT HEART CATHETERIZATION WITH CORONARY ANGIOGRAM;  Surgeon: Peter M Martinique, MD;  Location: [facility] CATH LAB;  Service: Cardiovascular;  Laterality: N/A;   Social History:  reports that he quit smoking about 35 years ago. His smoking use included Cigarettes. He has a 30 pack-year smoking history. He has quit using smokeless tobacco. He reports that he drinks about 2.4 oz of alcohol per week. He reports that he does not use illicit drugs.  Allergies  Allergen Reactions  . Statins Other (See Comments)  Myalgias, muscle weakness    Family History  Problem Relation Age of Onset  . Stroke Mother 45    Cerebellar hemorrhage    Prior to Admission medications   Medication Sig Start Date End Date Taking? Authorizing Provider  amLODipine (NORVASC) 10 MG tablet Take 10 mg by mouth daily.   Yes Historical Provider, MD  aspirin 325 MG tablet Take 325 mg by mouth daily.   Yes Historical Provider, MD  cilostazol (PLETAL) 100 MG tablet TAKE 1 TABLET TWICE A DAY  01/29/15  Yes Peter M Martinique, MD  DUREZOL 0.05 % EMUL Place 1 drop into the right eye daily. Use for 1 month after procedure 03/10/15  Yes Historical Provider, MD  gentamicin (GARAMYCIN) 0.3 % ophthalmic solution Place 1 drop into the right eye 4 (four) times daily. For 1 week after procedure 03/10/15  Yes Historical Provider, MD  ILEVRO 0.3 % ophthalmic suspension Place 1 drop into the right eye daily. For 2 weeks after procedure 03/10/15  Yes Historical Provider, MD  lisinopril (PRINIVIL,ZESTRIL) 40 MG tablet Take 1 tablet (40 mg total) by mouth daily. 12/19/13  Yes Peter M Martinique, MD  metFORMIN (GLUCOPHAGE) 1000 MG tablet Take 1 tablet (1,000 mg total) by mouth 2 (two) times daily with a meal. 12/15/11  Yes Donielle Liston Alba, PA-C  metoprolol (LOPRESSOR) 100 MG tablet Take 1 tablet by mouth daily. 02/11/15  Yes Historical Provider, MD    Physical Exam: Filed Vitals:   04/01/15 0100 04/01/15 0115 04/01/15 0205 04/01/15 0513  BP: 128/58 141/60 150/59 140/58  Pulse: 71 73 72 66  Temp:   97.6 F (36.4 C) 98.5 F (36.9 C)  TempSrc:   Oral Oral  Resp: 23 17 18 12   Height:   5\' 10"  (1.778 m)   Weight:   83.598 kg (184 lb 4.8 oz)   SpO2: 92% 90% 98% 97%    General: Alert, Awake and Oriented to Time, Place and Person. Appear in mild distress Eyes: PERRL ENT: Oral Mucosa clear moist. Neck: no JVD Cardiovascular: S1 and S2 Present, no Murmur, Peripheral Pulses Present Respiratory: Bilateral Air entry equal and Decreased,  Clear to Auscultation, no Crackles, no wheezes Abdomen: Bowel Sound present, Soft and non tender Skin: no Rash Extremities: no Pedal edema, no calf tenderness Neurologic: Mental status AAOx3, speech normal, attention normal,  Cranial Nerves PERRL, EOM normal and present, facial sensation to light touch present,  Motor strength bilateral equal strength 5/5,  Sensation present to light touch,  reflexes present knee and biceps, babinski negative,  Proprioception normal,    Cerebellar test difficult finger nose finger.   Labs on Admission:  CBC:  Recent Labs Lab 03/31/15 2235 03/31/15 2241 04/01/15 0344  WBC 12.0*  --  7.9  NEUTROABS 9.8*  --   --   HGB 14.5 14.6 11.9*  HCT 39.7 43.0 33.7*  MCV 92.8  --  92.6  PLT 137*  --  129*    CMP     Component Value Date/Time   NA 135 04/01/2015 0344   K 4.4 04/01/2015 0344   CL 103 04/01/2015 0344   CO2 24 04/01/2015 0344   GLUCOSE 153* 04/01/2015 0344   BUN 7 04/01/2015 0344   CREATININE 1.26* 04/01/2015 0344   CALCIUM 8.8* 04/01/2015 0344   PROT 6.0* 04/01/2015 0344   ALBUMIN 3.7 04/01/2015 0344   AST 23 04/01/2015 0344   ALT 24 04/01/2015 0344   ALKPHOS 38 04/01/2015 0344   BILITOT 0.7 04/01/2015 0344  GFRNONAA 54* 04/01/2015 0344   GFRAA >60 04/01/2015 0344    No results for input(s): LIPASE, AMYLASE in the last 168 hours.   Recent Labs Lab 04/01/15 0344  TROPONINI <0.03   BNP (last 3 results) No results for input(s): BNP in the last 8760 hours.  ProBNP (last 3 results) No results for input(s): PROBNP in the last 8760 hours.   Radiological Exams on Admission: Dg Chest Port 1 View  04/01/2015   CLINICAL DATA:  Weakness.  Code STEMI.  EXAM: PORTABLE CHEST - 1 VIEW  COMPARISON:  01/05/2012  FINDINGS: Patient is post median sternotomy. The cardiomediastinal contours are normal. The lungs are clear. Pulmonary vasculature is normal. No consolidation, pleural effusion, or pneumothorax. No acute osseous abnormalities are seen.  IMPRESSION: Post median sternotomy.  No acute pulmonary process.   Electronically Signed   By: Jeb Levering M.D.   On: 04/01/2015 00:18   EKG: Independently reviewed. normal sinus rhythm, nonspecific ST and T waves changes.  Assessment/Plan Principal Problem:   Near syncope Active Problems:   Hypertension   Peripheral arterial disease   Hyperlipidemia   Diabetes mellitus, type 2   CAD (coronary artery disease)   PAT (paroxysmal atrial tachycardia)    Dizziness and giddiness   1. Near syncope The patient is presenting with complaints of diaphoresis dizziness and generalized weakness. This happened after her cataract surgery. Patient had initial concerning changes on EKG therefore cardiology was consulted. Code STEMI was canceled. He has some difficulty with finger-nose-finger on exam. Although he is not sure whether he had that chronically. I'll get an MRI of the brain the morning. The patient is off for any TPA window period. If the MRI is positive neurology should be consulted. PTOT consultation.  2. Abnormal EKG. Initially there was code STEMI called but later on it was canceled. Currently we will follow serial troponin. Echocardiogram in the morning. Patient remains nothing by mouth after midnight.  3. History of diabetes mellitus. Holding metformin and placing him on sliding scale.  4. Peripheral vascular disease. Continuing Pletal.  5. Recent cataract surgery. Continuing eyedrops and eye precautions.  Advance goals of care discussion: Full code   DVT Prophylaxis: subcutaneous Heparin Nutrition: Nothing by mouth after midnight  Disposition: Admitted as observation, telemetry unit.  Author: Berle Mull, MD [facility] Pager: [phone] 04/01/2015  If 7PM-7AM, please contact night-coverage [email or web address] Password [email or web address]

## 2015-04-01 NOTE — Progress Notes (Signed)
  Echocardiogram 2D Echocardiogram has been performed.  Steve [name] 04/01/2015, 4:02 PM

## 2015-04-02 LAB — HEMOGLOBIN A1C
Hgb A1c MFr Bld: 5.3 % (ref 4.8–5.6)
Mean Plasma Glucose: 105 mg/dL

## 2015-04-09 ENCOUNTER — Ambulatory Visit (INDEPENDENT_AMBULATORY_CARE_PROVIDER_SITE_OTHER): Payer: Medicare Other | Admitting: Nurse Practitioner

## 2015-04-09 ENCOUNTER — Encounter: Payer: Self-pay | Admitting: Nurse Practitioner

## 2015-04-09 VITALS — BP 160/60 | HR 95 | Ht 70.0 in | Wt 191.6 lb

## 2015-04-09 DIAGNOSIS — E785 Hyperlipidemia, unspecified: Secondary | ICD-10-CM

## 2015-04-09 DIAGNOSIS — I255 Ischemic cardiomyopathy: Secondary | ICD-10-CM

## 2015-04-09 DIAGNOSIS — I10 Essential (primary) hypertension: Secondary | ICD-10-CM | POA: Insufficient documentation

## 2015-04-09 DIAGNOSIS — E119 Type 2 diabetes mellitus without complications: Secondary | ICD-10-CM | POA: Diagnosis not present

## 2015-04-09 DIAGNOSIS — I480 Paroxysmal atrial fibrillation: Secondary | ICD-10-CM | POA: Insufficient documentation

## 2015-04-09 DIAGNOSIS — I2510 Atherosclerotic heart disease of native coronary artery without angina pectoris: Secondary | ICD-10-CM | POA: Insufficient documentation

## 2015-04-09 NOTE — Patient Instructions (Signed)
Medication Instructions:  Your physician recommends that you continue on your current medications as directed. Please refer to the Current Medication list given to you today.  Labwork: NONE  Testing/Procedures: NONE  Follow-Up: Your physician recommends that you keep your scheduled appointment with Dr. Martinique.   Any Other Special Instructions Will Be Listed Below (If Applicable).

## 2015-04-09 NOTE — Progress Notes (Signed)
Patient Name: Steve [name]. Date of Encounter: 04/09/2015  Primary Care Provider:  Leonard Downing, MD Primary Cardiologist:  P. Martinique, MD   Chief Complaint  74 year old male with prior history of CAD status post recent hospitalization related to fatigue, dizziness, slurring of speech.   Past Medical History   Past Medical History  Diagnosis Date  . Type II diabetes mellitus   . Essential hypertension   . Peripheral arterial disease   . Hyperlipidemia   . CAD (coronary artery disease)     a. s/p CABG x 29 Nov 2011 with LIMA to LAD, SVG to OM1 and distal LCX, SVG to ramus intermediate  . PAF (paroxysmal atrial fibrillation)     a. post op atrial fib 11/2011; short course of amiodarone; stopped 01/25/12  . Right bundle branch block   . Intermittent claudication   . Ischemic cardiomyopathy     a. 03/2015 Echo: EF45-50%, Gr1 DD, mild MR.   Past Surgical History  Procedure Laterality Date  . Cholecystectomy  1999  . Cardiac catheterization  01/16/1999    Est. EF of 65% -- Nonobstruction atherosclerotic coronary artery disease -- Normal left ventricular function      . Cardiac electrophysiology study and ablation  05/21/1999    Normal sinus funtion -- Mildly prolonged interatrial conduction times -- Normal A-V node funtion -- Normal His Purkinje system function -- fNo accessory pathway -- No inducible ventricular tachycardia in the presence of the or in the absence of isoproterenol with programmed stimulation or with burst pacing -- Nikki Dom, M.D. FACC surgan          . Coronary artery bypass graft  12/10/2011    Procedure: CORONARY ARTERY BYPASS GRAFTING (CABG);  Surgeon: Tharon Aquas Adelene Idler, MD;  Location: [facility];  Service: Open Heart Surgery;  Laterality: N/A;  Coronary Artery bypass graft on pump times four utilizing left internal mammary artery and left saphenous vein harvested endoscopically  . Left heart catheterization with coronary angiogram N/A 12/09/2011   Procedure: LEFT HEART CATHETERIZATION WITH CORONARY ANGIOGRAM;  Surgeon: Peter M Martinique, MD;  Location: [facility] CATH LAB;  Service: Cardiovascular;  Laterality: N/A;    Allergies  Allergies  Allergen Reactions  . Statins Other (See Comments)    Myalgias, muscle weakness    HPI  44 rolled male with the above Problem list. He is status post coronary artery bypass grafting 4 2013. He says that since then, he has done well without any chest pain or dyspnea though his activity is fairly limited secondary to peripheral arterial disease and chronic intermittent claudication. On June 6, he underwent cataract surgery and was at home that evening when he had sudden onset of fatigue, imbalance, and slurring of his speech. He says that he felt he was drunk. Symptoms resolved within an hour. He did call EMS and they recommended evaluation in the emergency department. There, ECG was nonacute. An MRI of the brain was performed that showed no acute intracranial process the chronic cerebellar infarcts and small vessel disease was noted. He was observed overnight and evaluated by cardiology. Echocardiogram was performed and showed an EF of 45-50% with grade 1 diastolic dysfunction and mild mitral regurgitation. Cardiac markers remain negative. Recommendation was made for outpatient follow-up with consideration given to her to outpatient stress testing. Since discharge from the hospital, he has had no recurrence of neurologic type symptoms. He also does not have chest pain or dyspnea. He denies PND, orthopnea, dizziness, syncope, edema, or  early satiety. As above, he does have intermittent claudication which limits his activity. He states today that he is not interested in stress testing at this time.  Home Medications  Prior to Admission medications   Medication Sig Start Date End Date Taking? Authorizing Provider  amLODipine (NORVASC) 10 MG tablet Take 10 mg by mouth daily.   Yes Historical Provider, MD  aspirin 325  MG tablet Take 325 mg by mouth daily.   Yes Historical Provider, MD  cilostazol (PLETAL) 100 MG tablet Take 100 mg by mouth 2 (two) times daily.   Yes Historical Provider, MD  DUREZOL 0.05 % EMUL Place 1 drop into the right eye daily. Use for 1 month after procedure 03/10/15  Yes Historical Provider, MD  ILEVRO 0.3 % ophthalmic suspension Place 1 drop into the right eye daily. For 2 weeks after procedure 03/10/15  Yes Historical Provider, MD  lisinopril (PRINIVIL,ZESTRIL) 40 MG tablet Take 1 tablet (40 mg total) by mouth daily. 12/19/13  Yes Peter M Martinique, MD  metFORMIN (GLUCOPHAGE) 1000 MG tablet Take 1 tablet (1,000 mg total) by mouth 2 (two) times daily with a meal. 12/15/11  Yes Donielle Liston Alba, PA-C  metoprolol (LOPRESSOR) 100 MG tablet Take 1 tablet by mouth daily. 02/11/15  Yes Historical Provider, MD    Review of Systems  Intermittent claudication as above.  All other systems reviewed and are otherwise negative except as noted above.  Physical Exam  VS:  BP 160/60 mmHg  Pulse 95  Ht 5\' 10"  (1.778 m)  Wt 191 lb 9.6 oz (86.909 kg)  BMI 27.49 kg/m2  SpO2 98% , BMI Body mass index is 27.49 kg/(m^2). GEN: Well nourished, well developed, in no acute distress. HEENT: normal. Neck: Supple, no JVD, carotid bruits, or masses. Cardiac: RRR, no rubs, or gallops. 2/6 systolic murmur at the right upper sternal border. No clubbing, cyanosis. Trace bilateral malleolar edema.  Radials/DP/PT 2+ and equal bilaterally.  Respiratory:  Respirations regular and unlabored, clear to auscultation bilaterally. GI: Soft, nontender, nondistended, BS + x 4. MS: no deformity or atrophy. Skin: warm and dry, no rash. Neuro:  Strength and sensation are intact. Psych: Normal affect.  Accessory Clinical Findings  Patient refused ECG today. EKG from June 6 shows sinus rhythm rate of 82, left axis with old inferior infarct and poor R-wave progression.  Assessment & Plan  1.  Coronary artery disease: Status  post coronary artery bypass grafting in 2013. He has not been having any chest pain or dyspnea. He was recently admitted for strokelike symptoms and an echocardiogram during admission showed an EF of 45-50% without wall motion abnormalities. It was suggested that outpatient stress testing should be considered. We discussed this today. Patient is not interested in stress testing at that time and will notify us if he develops chest pain or dyspnea. He remains on aspirin, beta blocker therapy. He is intolerant to statins.  2. Ischemic cardiomyopathy: EF 4550% by echocardiogram on June 7. No evidence of volume overload. As above, he is not interested in stress testing at this time. He is on beta blocker and ACE inhibitor therapy.  3. Essential hypertension: Blood pressure elevated in clinic today however he says that this typically runs in the 130s. Will not make changes today and he will continue to follow his blood pressures at home. He remains on beta blocker, ACE inhibitor, and calcium channel blocker therapy.  4. Hyperlipidemia: He is intolerant to statins. He has not had any recent lipids in  our system. He could be considered for a PCKS9 inhibitor in the future.  5. Peripheral arterial disease: Patient has chronic intermittent claudication which she says is stable.  6. Recent history of stroke-like symptoms: Patient developed slurring of speech with unsteady gait following cataract surgery. MRI of the brain was negative for acute findings the chronic cerebellar infarcts were noted along with small vessel disease. It was ultimately felt that symptoms may have been related to anesthesia provided early in the day with cataract surgery. Patient has had no recurrence of symptoms. He is on aspirin.  7. Type 2 diabetes mellitus: Followed by internal medicine.  8. Disposition: Patient is going to [place] for a week and August and then has follow-up with Dr. Martinique. He will notify us if he develops any  symptoms prior to leaving for [place].  Murray Hodgkins, NP 04/09/2015, 11:59 AM

## 2015-04-16 ENCOUNTER — Telehealth (HOSPITAL_COMMUNITY): Payer: Self-pay | Admitting: *Deleted

## 2015-04-16 NOTE — Telephone Encounter (Signed)
Patient given detailed instructions per Myocardial Perfusion Study Information Sheet for test on 04/16/15 at 11:45.. Patient Notified to arrive 15 minutes early, and that it is imperative to arrive on time for appointment to keep from having the test rescheduled. Patient verbalized understanding. Hubbard Robinson, RN

## 2015-04-21 ENCOUNTER — Encounter (HOSPITAL_COMMUNITY): Payer: Self-pay | Admitting: *Deleted

## 2015-04-21 ENCOUNTER — Ambulatory Visit (HOSPITAL_COMMUNITY): Payer: Medicare Other

## 2015-04-21 NOTE — Progress Notes (Signed)
Patient showed up for Nuclear stress test, stated he did not want to have test done and left. Hubbard Robinson, RN

## 2015-06-19 ENCOUNTER — Ambulatory Visit (INDEPENDENT_AMBULATORY_CARE_PROVIDER_SITE_OTHER): Payer: Medicare Other | Admitting: Cardiology

## 2015-06-19 ENCOUNTER — Encounter: Payer: Self-pay | Admitting: Cardiology

## 2015-06-19 VITALS — BP 158/64 | HR 84 | Ht 70.0 in | Wt 182.4 lb

## 2015-06-19 DIAGNOSIS — I2510 Atherosclerotic heart disease of native coronary artery without angina pectoris: Secondary | ICD-10-CM | POA: Diagnosis not present

## 2015-06-19 DIAGNOSIS — I255 Ischemic cardiomyopathy: Secondary | ICD-10-CM | POA: Diagnosis not present

## 2015-06-19 DIAGNOSIS — Z951 Presence of aortocoronary bypass graft: Secondary | ICD-10-CM

## 2015-06-19 DIAGNOSIS — N182 Chronic kidney disease, stage 2 (mild): Secondary | ICD-10-CM | POA: Diagnosis not present

## 2015-06-19 DIAGNOSIS — I480 Paroxysmal atrial fibrillation: Secondary | ICD-10-CM

## 2015-06-19 DIAGNOSIS — I4510 Unspecified right bundle-branch block: Secondary | ICD-10-CM

## 2015-06-19 NOTE — Progress Notes (Signed)
Steve [name]. Date of Birth: December 25, 1940 Medical Record [id number]  History of Present Illness: Steve [name] is seen today for followup of CAD. He had CABG x 4 in February 2013.  EF was normal.  Other problems include HTN, DM, PVD, and HLD. 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 He was admitted in early June. He had cataract surgery that morning. In the evening he developed diaphoresis and slurred speech. Felt drunk. Symptoms lasted less than one hour. Cranial MRI showed no acute change with small old cerebellar infarcts. Ecg was unchanged. Troponin levels normal. Echo showed EF 45-50% with inferior HK. Overall it was felt that he may have had a reaction to anesthesia. On follow up today he feels great. No chest pain or SOB. Recently travelled to [place] without problems. Did a fair amount of walking.   Current Outpatient Prescriptions on File Prior to Visit  Medication Sig Dispense Refill  . amLODipine (NORVASC) 10 MG tablet Take 10 mg by mouth daily.    Marland Kitchen aspirin 325 MG tablet Take 325 mg by mouth daily.    . cilostazol (PLETAL) 100 MG tablet Take 100 mg by mouth 2 (two) times daily.    Marland Kitchen lisinopril (PRINIVIL,ZESTRIL) 40 MG tablet Take 1 tablet (40 mg total) by mouth daily. 90 tablet 3  . metFORMIN (GLUCOPHAGE) 1000 MG tablet Take 1 tablet (1,000 mg total) by mouth 2 (two) times daily with a meal.    . metoprolol (LOPRESSOR) 100 MG tablet Take 1 tablet by mouth daily.  0  . [DISCONTINUED] furosemide (LASIX) 40 MG tablet Take 1 tablet (40 mg total) by mouth daily. For 4 days then stop. 4 tablet 0  . [DISCONTINUED] potassium chloride SA (K-DUR,KLOR-CON) 20 MEQ tablet Take 1 tablet (20 mEq total) by mouth daily. For 4 days the stop. 4 tablet 0   No current facility-administered medications on file prior to visit.    Allergies  Allergen Reactions  . Statins Other (See Comments)    Myalgias, muscle weakness    Past Medical History  Diagnosis Date  . Type II diabetes mellitus   . Essential  hypertension   . Peripheral arterial disease   . Hyperlipidemia   . CAD (coronary artery disease)     a. s/p CABG x 29 Nov 2011 with LIMA to LAD, SVG to OM1 and distal LCX, SVG to ramus intermediate  . PAF (paroxysmal atrial fibrillation)     a. post op atrial fib 11/2011; short course of amiodarone; stopped 01/25/12  . Right bundle branch block   . Intermittent claudication   . Ischemic cardiomyopathy     a. 03/2015 Echo: EF45-50%, Gr1 DD, mild MR.    Past Surgical History  Procedure Laterality Date  . Cholecystectomy  1999  . Cardiac catheterization  01/16/1999    Est. EF of 65% -- Nonobstruction atherosclerotic coronary artery disease -- Normal left ventricular function      . Cardiac electrophysiology study and ablation  05/21/1999    Normal sinus funtion -- Mildly prolonged interatrial conduction times -- Normal A-V node funtion -- Normal His Purkinje system function -- fNo accessory pathway -- No inducible ventricular tachycardia in the presence of the or in the absence of isoproterenol with programmed stimulation or with burst pacing -- Nikki Dom, M.D. FACC surgan          . Coronary artery bypass graft  12/10/2011    Procedure: CORONARY ARTERY BYPASS GRAFTING (CABG);  Surgeon: Tharon Aquas Adelene Idler, MD;  Location: [facility];  Service: Open Heart Surgery;  Laterality: N/A;  Coronary Artery bypass graft on pump times four utilizing left internal mammary artery and left saphenous vein harvested endoscopically  . Left heart catheterization with coronary angiogram N/A 12/09/2011    Procedure: LEFT HEART CATHETERIZATION WITH CORONARY ANGIOGRAM;  Surgeon: Addalynne Golding M Martinique, MD;  Location: [facility] CATH LAB;  Service: Cardiovascular;  Laterality: N/A;    History  Smoking status  . Former Smoker -- 1.50 packs/day for 20 years  . Types: Cigarettes  . Quit date: 05/27/1979  Smokeless tobacco  . Former User    History  Alcohol Use  . 2.4 oz/week  . 4 Shots of liquor per week    Comment: 4  "canadian clubs each night"    Family History  Problem Relation Age of Onset  . Stroke Mother 45    Cerebellar hemorrhage    Review of Systems: The review of systems is positive for neuropathy of his feet.  All other systems were reviewed and are negative.  Physical Exam: BP 158/64 mmHg  Pulse 84  Ht 5\' 10"  (1.778 m)  Wt 82.736 kg (182 lb 6.4 oz)  BMI 26.17 kg/m2 Patient is  pleasant and in no acute distress. Skin is warm and dry. Color is normal.  HEENT is unremarkable. Neck is supple. No masses. No JVD. Lungs are clear. Cardiac exam shows a regular rate and rhythm. Normal S1-2. no gallop or murmur. Abdomen is soft. No masses. Extremities are without edema.  Pedal pulses are poor without discoloration or ulcers. Gait and ROM are intact. No gross neurologic deficits noted.  LABORATORY DATA:  Echo: 6.7.16:Study Conclusions  - Left ventricle: Technically difficult study. Significant hypokinesis of the inferior wall. EF 45-50%. Doppler parameters are consistent with abnormal left ventricular relaxation (grade 1 diastolic dysfunction). - Mitral valve: There was mild regurgitation. - Right ventricle: RV is poorly seen. TAPSE is normal. I suspect RV is OK. I have personally reviewed and interpreted this study.   Assessment / Plan: 1. Coronary disease status post CABG. Doing very well- asymptomatic. We will continue with risk factor modification.  2. Diabetes mellitus type 2, controlled.  3. Hypertension- BP is mildly elevated today. Recommend sodium restriction. He will monitor his BP at home.   Continue amlodipine, metoprolol, and ACEi.  4. Hyperlipidemia  History of intolerance to statins  5. PAD. Arteriogram done in 2009. Dopplers stable. Patient does not want to pursue further work up.  6. Peripheral neuropathy.  7. Ischemic cardiomyopathy. EF 45-50% with inferior HK. This is new since Cardiac cath pre bypass. He is asymptomatic and does not want further ischemic  work up. Will monitor for symptoms. He is on appropriate therapy with ACEi and beta blocker.   I will follow up in 6 months.

## 2015-06-19 NOTE — Patient Instructions (Signed)
Continue your current therapy  I will see you in 6 months.   

## 2015-08-08 ENCOUNTER — Other Ambulatory Visit: Payer: Self-pay | Admitting: Cardiology

## 2015-12-22 ENCOUNTER — Encounter: Payer: Self-pay | Admitting: Cardiology

## 2015-12-22 ENCOUNTER — Ambulatory Visit (INDEPENDENT_AMBULATORY_CARE_PROVIDER_SITE_OTHER): Payer: Medicare Other | Admitting: Cardiology

## 2015-12-22 VITALS — BP 180/90 | HR 88 | Ht 70.0 in | Wt 192.6 lb

## 2015-12-22 DIAGNOSIS — E1142 Type 2 diabetes mellitus with diabetic polyneuropathy: Secondary | ICD-10-CM | POA: Diagnosis not present

## 2015-12-22 DIAGNOSIS — I10 Essential (primary) hypertension: Secondary | ICD-10-CM | POA: Diagnosis not present

## 2015-12-22 DIAGNOSIS — I2510 Atherosclerotic heart disease of native coronary artery without angina pectoris: Secondary | ICD-10-CM | POA: Diagnosis not present

## 2015-12-22 DIAGNOSIS — N182 Chronic kidney disease, stage 2 (mild): Secondary | ICD-10-CM | POA: Diagnosis not present

## 2015-12-22 DIAGNOSIS — I255 Ischemic cardiomyopathy: Secondary | ICD-10-CM

## 2015-12-22 DIAGNOSIS — I739 Peripheral vascular disease, unspecified: Secondary | ICD-10-CM

## 2015-12-22 DIAGNOSIS — Z951 Presence of aortocoronary bypass graft: Secondary | ICD-10-CM

## 2015-12-22 MED ORDER — CHLORTHALIDONE 50 MG PO TABS: 50.0000 mg | ORAL_TABLET | Freq: Every day | ORAL | Status: DC

## 2015-12-22 NOTE — Patient Instructions (Signed)
Continue your current therapy  Add chlorthalidone 50 mg daily for blood pressure  Monitor your blood pressure at home  You need to have blood work in a couple of weeks to check your potassium and kidney function

## 2015-12-23 NOTE — Progress Notes (Signed)
Steve [name]. Date of Birth: 20-Oct-1941 Medical Record [id number]  History of Present Illness: Steve [name] is seen today for followup of CAD. He had CABG x 4 in February 2013.  EF was normal.  Other problems include HTN, DM, PVD, and HLD. 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  Echo in June 2016 showed EF 45-50% with inferior HK.   On follow up today he feels well. He did note some soreness in his legs but this improved with wearing support hose.  No chest pain or SOB. Doesn't check BP at home.   Current Outpatient Prescriptions on File Prior to Visit  Medication Sig Dispense Refill  . amLODipine (NORVASC) 10 MG tablet Take 10 mg by mouth daily.    Marland Kitchen aspirin 325 MG tablet Take 325 mg by mouth daily.    . cilostazol (PLETAL) 100 MG tablet TAKE 1 TABLET TWICE A DAY 180 tablet 3  . lisinopril (PRINIVIL,ZESTRIL) 40 MG tablet Take 1 tablet (40 mg total) by mouth daily. 90 tablet 3  . metFORMIN (GLUCOPHAGE) 1000 MG tablet Take 1 tablet (1,000 mg total) by mouth 2 (two) times daily with a meal.    . metoprolol (LOPRESSOR) 100 MG tablet Take 1 tablet by mouth daily.  0  . [DISCONTINUED] furosemide (LASIX) 40 MG tablet Take 1 tablet (40 mg total) by mouth daily. For 4 days then stop. 4 tablet 0  . [DISCONTINUED] potassium chloride SA (K-DUR,KLOR-CON) 20 MEQ tablet Take 1 tablet (20 mEq total) by mouth daily. For 4 days the stop. 4 tablet 0   No current facility-administered medications on file prior to visit.    Allergies  Allergen Reactions  . Statins Other (See Comments)    Myalgias, muscle weakness    Past Medical History  Diagnosis Date  . Type II diabetes mellitus ([place])   . Essential hypertension   . Peripheral arterial disease ([place])   . Hyperlipidemia   . CAD (coronary artery disease)     a. s/p CABG x 29 Nov 2011 with LIMA to LAD, SVG to OM1 and distal LCX, SVG to ramus intermediate  . PAF (paroxysmal atrial fibrillation) ([place])     a. post op atrial fib 11/2011; short course of  amiodarone; stopped 01/25/12  . Right bundle branch block   . Intermittent claudication ([place])   . Ischemic cardiomyopathy     a. 03/2015 Echo: EF45-50%, Gr1 DD, mild MR.    Past Surgical History  Procedure Laterality Date  . Cholecystectomy  1999  . Cardiac catheterization  01/16/1999    Est. EF of 65% -- Nonobstruction atherosclerotic coronary artery disease -- Normal left ventricular function      . Cardiac electrophysiology study and ablation  05/21/1999    Normal sinus funtion -- Mildly prolonged interatrial conduction times -- Normal A-V node funtion -- Normal His Purkinje system function -- fNo accessory pathway -- No inducible ventricular tachycardia in the presence of the or in the absence of isoproterenol with programmed stimulation or with burst pacing -- Nikki Dom, M.D. FACC surgan          . Coronary artery bypass graft  12/10/2011    Procedure: CORONARY ARTERY BYPASS GRAFTING (CABG);  Surgeon: Tharon Aquas Adelene Idler, MD;  Location: [facility];  Service: Open Heart Surgery;  Laterality: N/A;  Coronary Artery bypass graft on pump times four utilizing left internal mammary artery and left saphenous vein harvested endoscopically  . Left heart catheterization with coronary angiogram N/A 12/09/2011  Procedure: LEFT HEART CATHETERIZATION WITH CORONARY ANGIOGRAM;  Surgeon: Ralpheal Zappone M Martinique, MD;  Location: [facility] CATH LAB;  Service: Cardiovascular;  Laterality: N/A;    History  Smoking status  . Former Smoker -- 1.50 packs/day for 20 years  . Types: Cigarettes  . Quit date: 05/27/1979  Smokeless tobacco  . Former User    History  Alcohol Use  . 2.4 oz/week  . 4 Shots of liquor per week    Comment: 4 "canadian clubs each night"    Family History  Problem Relation Age of Onset  . Stroke Mother 63    Cerebellar hemorrhage    Review of Systems: The review of systems is positive for neuropathy of his feet.  All other systems were reviewed and are negative.  Physical Exam: BP  180/90 mmHg  Pulse 88  Ht 5\' 10"  (1.778 m)  Wt 87.363 kg (192 lb 9.6 oz)  BMI 27.64 kg/m2 Patient is  pleasant and in no acute distress. Skin is warm and dry. Color is normal.  HEENT is unremarkable. Neck is supple. No masses. No JVD. Lungs are clear. Cardiac exam shows a regular rate and rhythm. Normal S1-2. no gallop or murmur. Abdomen is soft. No masses. Extremities are without edema.  Pedal pulses are poor without discoloration or ulcers. Gait and ROM are intact. No gross neurologic deficits noted.  LABORATORY DATA:  Echo: 6.7.16:Study Conclusions  - Left ventricle: Technically difficult study. Significant hypokinesis of the inferior wall. EF 45-50%. Doppler parameters are consistent with abnormal left ventricular relaxation (grade 1 diastolic dysfunction). - Mitral valve: There was mild regurgitation. - Right ventricle: RV is poorly seen. TAPSE is normal. I suspect RV is OK.    Assessment / Plan: 1. Coronary disease status post CABG. Doing very well- asymptomatic. We will continue with risk factor modification.  2. Diabetes mellitus type 2, controlled.  3. Hypertension- BP is significantly elevated today.  Recommend sodium restriction.  Continue amlodipine, metoprolol, and ACEi- he is pretty maxed out on these meds. Will add Chlorthalidone 50 mg daily. He is scheduled to see Dr. Arelia Sneddon in a couple of weeks and will have a BMET checked at that time.   4. Hyperlipidemia  History of intolerance to statins  5. PAD. Arteriogram done in 2009. Dopplers stable. No worsening claudication.  6. Peripheral neuropathy.  7. Ischemic cardiomyopathy. EF 45-50% with inferior HK.  He is asymptomatic and does not want further ischemic work up. Will monitor for symptoms. He is on appropriate therapy with ACEi and beta blocker.   I will follow up in 6 months.

## 2016-04-05 ENCOUNTER — Telehealth: Payer: Self-pay | Admitting: Cardiology

## 2016-04-05 NOTE — Telephone Encounter (Signed)
FORWARD TO [id number]

## 2016-04-05 NOTE — Telephone Encounter (Signed)
New Message  Pt calling to sched recall for Aug/2017 w/ Dr Martinique- pt was made aware that their are no avail open appts w/ Dr Martinique thru the end of Sept/2017- Pt was offered APP appt- pt refused and requested to speak w/ RN. Please call back and discuss.

## 2016-04-06 NOTE — Telephone Encounter (Signed)
Returned call to patient.Stated he was doing good.Advised to call back the end of this month to schedule Oct follow up with Dr.Jordan.

## 2016-04-26 ENCOUNTER — Telehealth: Payer: Self-pay | Admitting: Cardiology

## 2016-04-26 NOTE — Telephone Encounter (Signed)
New Message  Pt requested to only speak w/ RN about following up w/ Dr Martinique. Please call back and discuss.  a

## 2016-04-26 NOTE — Telephone Encounter (Signed)
Returned call to patient.Stated he called to schedule Sept appt was told Dr.Jordan's schedule is full. Stated he is doing good.Advised I will call him back when Oct schedule comes out with a appointment.

## 2016-08-02 ENCOUNTER — Other Ambulatory Visit: Payer: Self-pay | Admitting: Cardiology

## 2016-08-04 NOTE — Progress Notes (Signed)
[name] Date of Birth: [birth year] Medical Record #[id number]  History of Present Illness: [name] is seen today for followup of CAD. He had CABG x 4 in February 2013.  EF was normal.  Other problems include HTN, DM, PVD, and HLD. 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  Echo in June 2016 showed EF 45-50% with inferior HK. On his last visit in February we recommended adding HCTZ  for uncontrolled HTN. He states he never took it due to concerns about his kidney function.  On follow up today he feels well. He has chronic soreness in his legs but this improved with wearing support hose.  No chest pain or SOB. States his BP at home is typically 563-149 systolic. No complaints. States he doesn't do a whole lot of activity. He reports lab work in June showed a triglyceride level of 206. A1c 4.9%. Other labs normal.  Current Outpatient Prescriptions on File Prior to Visit  Medication Sig Dispense Refill  . amLODipine (NORVASC) 10 MG tablet Take 10 mg by mouth daily.    Marland Kitchen aspirin 325 MG tablet Take 325 mg by mouth daily.    . cilostazol (PLETAL) 100 MG tablet TAKE 1 TABLET TWICE A DAY 180 tablet 0  . lisinopril (PRINIVIL,ZESTRIL) 40 MG tablet Take 1 tablet (40 mg total) by mouth daily. 90 tablet 3  . metFORMIN (GLUCOPHAGE) 1000 MG tablet Take 1 tablet (1,000 mg total) by mouth 2 (two) times daily with a meal.    . metoprolol (LOPRESSOR) 100 MG tablet Take 1 tablet by mouth daily.  0  . [DISCONTINUED] furosemide (LASIX) 40 MG tablet Take 1 tablet (40 mg total) by mouth daily. For 4 days then stop. 4 tablet 0  . [DISCONTINUED] potassium chloride SA (K-DUR,KLOR-CON) 20 MEQ tablet Take 1 tablet (20 mEq total) by mouth daily. For 4 days the stop. 4 tablet 0   No current facility-administered medications on file prior to visit.     Allergies  Allergen Reactions  . Statins Other (See Comments)    Myalgias, muscle weakness    Past Medical History:  Diagnosis Date  . CAD (coronary artery  disease)    a. s/p CABG x 29 Nov 2011 with LIMA to LAD, SVG to OM1 and distal LCX, SVG to ramus intermediate  . Essential hypertension   . Hyperlipidemia   . Intermittent claudication ([place])   . Ischemic cardiomyopathy    a. 03/2015 Echo: EF45-50%, Gr1 DD, mild MR.  Marland Kitchen PAF (paroxysmal atrial fibrillation) ([place])    a. post op atrial fib 11/2011; short course of amiodarone; stopped 01/25/12  . Peripheral arterial disease ([place])   . Right bundle branch block   . Type II diabetes mellitus ([place])     Past Surgical History:  Procedure Laterality Date  . CARDIAC CATHETERIZATION  01/16/1999   Est. EF of 65% -- Nonobstruction atherosclerotic coronary artery disease -- Normal left ventricular function      . CARDIAC ELECTROPHYSIOLOGY STUDY AND ABLATION  05/21/1999   Normal sinus funtion -- Mildly prolonged interatrial conduction times -- Normal A-V node funtion -- Normal His Purkinje system function -- fNo accessory pathway -- No inducible ventricular tachycardia in the presence of the or in the absence of isoproterenol with programmed stimulation or with burst pacing -- Nikki Dom, M.D. FACC surgan          . CHOLECYSTECTOMY  1999  . CORONARY ARTERY BYPASS GRAFT  12/10/2011   Procedure: CORONARY ARTERY BYPASS  GRAFTING (CABG);  Surgeon: Tharon Aquas Adelene Idler, MD;  Location: [place];  Service: Open Heart Surgery;  Laterality: N/A;  Coronary Artery bypass graft on pump times four utilizing left internal mammary artery and left saphenous vein harvested endoscopically  . LEFT HEART CATHETERIZATION WITH CORONARY ANGIOGRAM N/A 12/09/2011   Procedure: LEFT HEART CATHETERIZATION WITH CORONARY ANGIOGRAM;  Surgeon: Tyson Masin M Martinique, MD;  Location: [facility] CATH LAB;  Service: Cardiovascular;  Laterality: N/A;    History  Smoking Status  . Former Smoker  . Packs/day: 1.50  . Years: 20.00  . Types: Cigarettes  . Quit date: 05/27/1979  Smokeless Tobacco  . Former User    History  Alcohol Use  . 2.4 oz/week  . 4  Shots of liquor per week    Comment: 4 "canadian clubs each night"    Family History  Problem Relation Age of Onset  . Stroke Mother 50    Cerebellar hemorrhage    Review of Systems: The review of systems is positive for neuropathy of his feet.  All other systems were reviewed and are negative.  Physical Exam: BP (!) 166/58   Pulse 90   Ht 5\' 9"  (1.753 m)   Wt 192 lb 12.8 oz (87.5 kg)   BMI 28.47 kg/m  Patient is  pleasant and in no acute distress. Skin is warm and dry. Color is normal.  HEENT is unremarkable. Neck is supple. No masses. No JVD. Lungs are clear. Cardiac exam shows a regular rate and rhythm. Normal S1-2. no gallop or murmur. Abdomen is soft. No masses. Extremities are without edema.  Pedal pulses are poor without discoloration or ulcers. Gait and ROM are intact. No gross neurologic deficits noted.  LABORATORY DATA:  Echo: 6.7.16:Study Conclusions  - Left ventricle: Technically difficult study. Significant hypokinesis of the inferior wall. EF 45-50%. Doppler parameters are consistent with abnormal left ventricular relaxation (grade 1 diastolic dysfunction). - Mitral valve: There was mild regurgitation. - Right ventricle: RV is poorly seen. TAPSE is normal. I suspect RV is OK.  Ecg today shows NSR with rate 90. LAE, RBBB and LAHB. LVH with repolarization abnormality. Compared to prior Ecgs in 2014,2015,2016 the RBBB is new- but it was present in 2013. I have personally reviewed and interpreted this study.   Assessment / Plan: 1. Coronary disease status post CABG. Doing very well- asymptomatic. We will continue with risk factor modification.  2. Diabetes mellitus type 2, controlled.  3. Hypertension- BP is elevated today but improved from prior exam. Patient reports better BP readings at home. Does not want additional medication so will observe for now.  4. Hyperlipidemia  History of intolerance to statins. Labs from primary care requested.  5. PAD.  Arteriogram done in 2009. Dopplers stable. No worsening claudication.  6. Peripheral neuropathy.  7. Ischemic cardiomyopathy. EF 45-50% with inferior HK.  He is asymptomatic.  He is on appropriate therapy with ACEi and beta blocker.   I will follow up in 6 months.

## 2016-08-05 ENCOUNTER — Encounter: Payer: Self-pay | Admitting: Cardiology

## 2016-08-05 ENCOUNTER — Encounter (INDEPENDENT_AMBULATORY_CARE_PROVIDER_SITE_OTHER): Payer: Self-pay

## 2016-08-05 ENCOUNTER — Ambulatory Visit (INDEPENDENT_AMBULATORY_CARE_PROVIDER_SITE_OTHER): Payer: Medicare Other | Admitting: Cardiology

## 2016-08-05 VITALS — BP 166/58 | HR 90 | Ht 69.0 in | Wt 192.8 lb

## 2016-08-05 DIAGNOSIS — I10 Essential (primary) hypertension: Secondary | ICD-10-CM | POA: Diagnosis not present

## 2016-08-05 DIAGNOSIS — I255 Ischemic cardiomyopathy: Secondary | ICD-10-CM | POA: Diagnosis not present

## 2016-08-05 DIAGNOSIS — I739 Peripheral vascular disease, unspecified: Secondary | ICD-10-CM

## 2016-08-05 DIAGNOSIS — E7800 Pure hypercholesterolemia, unspecified: Secondary | ICD-10-CM | POA: Diagnosis not present

## 2016-08-05 DIAGNOSIS — I2510 Atherosclerotic heart disease of native coronary artery without angina pectoris: Secondary | ICD-10-CM

## 2016-08-05 DIAGNOSIS — I4510 Unspecified right bundle-branch block: Secondary | ICD-10-CM

## 2016-08-05 NOTE — Patient Instructions (Signed)
Continue your current therapy  We will request your lab work from Dr. Arelia Sneddon.  I will see you in 6 months.

## 2016-12-07 ENCOUNTER — Other Ambulatory Visit: Payer: Self-pay | Admitting: Cardiology

## 2016-12-08 NOTE — Telephone Encounter (Signed)
Rx(s) sent to pharmacy electronically.  

## 2017-01-25 NOTE — Progress Notes (Signed)
[name] Date of Birth: 26-Mar-1941 Medical Record #[id number]  History of Present Illness: Mr. Keysor is seen today for followup of CAD. He had CABG x 4 in February 2013.  EF was normal.  Other problems include HTN, DM, PVD, and HLD. 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  Echo in June 2016 showed EF 45-50% with inferior HK. On a prior visit  we recommended adding HCTZ  for uncontrolled HTN. He states he never took it due to concerns about his kidney function.   On follow up today he feels well. He has chronic soreness in his legs but this improved with wearing support hose. He still notes right leg tightness with exertion. Doesn't do a lot of walking. Mostly around the house.  No chest pain or SOB. States his BP at home is typically 941-740 systolic. He tells me that he WILL NOT take additional BP medication.  Current Outpatient Prescriptions on File Prior to Visit  Medication Sig Dispense Refill  . amLODipine (NORVASC) 10 MG tablet Take 10 mg by mouth daily.    Marland Kitchen aspirin 325 MG tablet Take 325 mg by mouth daily.    . cilostazol (PLETAL) 100 MG tablet TAKE 1 TABLET TWICE A DAY 180 tablet 2  . lisinopril (PRINIVIL,ZESTRIL) 40 MG tablet Take 1 tablet (40 mg total) by mouth daily. 90 tablet 3  . metFORMIN (GLUCOPHAGE) 1000 MG tablet Take 1 tablet (1,000 mg total) by mouth 2 (two) times daily with a meal.    . metoprolol (LOPRESSOR) 100 MG tablet Take 1 tablet by mouth daily.  0  . [DISCONTINUED] furosemide (LASIX) 40 MG tablet Take 1 tablet (40 mg total) by mouth daily. For 4 days then stop. 4 tablet 0  . [DISCONTINUED] potassium chloride SA (K-DUR,KLOR-CON) 20 MEQ tablet Take 1 tablet (20 mEq total) by mouth daily. For 4 days the stop. 4 tablet 0   No current facility-administered medications on file prior to visit.     Allergies  Allergen Reactions  . Statins Other (See Comments)    Myalgias, muscle weakness    Past Medical History:  Diagnosis Date  . CAD (coronary artery  disease)    a. s/p CABG x 29 Nov 2011 with LIMA to LAD, SVG to OM1 and distal LCX, SVG to ramus intermediate  . Essential hypertension   . Hyperlipidemia   . Intermittent claudication ([place])   . Ischemic cardiomyopathy    a. 03/2015 Echo: EF45-50%, Gr1 DD, mild MR.  Marland Kitchen PAF (paroxysmal atrial fibrillation) ([place])    a. post op atrial fib 11/2011; short course of amiodarone; stopped 01/25/12  . Peripheral arterial disease ([place])   . Right bundle branch block   . Type II diabetes mellitus ([place])     Past Surgical History:  Procedure Laterality Date  . CARDIAC CATHETERIZATION  01/16/1999   Est. EF of 65% -- Nonobstruction atherosclerotic coronary artery disease -- Normal left ventricular function      . CARDIAC ELECTROPHYSIOLOGY STUDY AND ABLATION  05/21/1999   Normal sinus funtion -- Mildly prolonged interatrial conduction times -- Normal A-V node funtion -- Normal His Purkinje system function -- fNo accessory pathway -- No inducible ventricular tachycardia in the presence of the or in the absence of isoproterenol with programmed stimulation or with burst pacing -- Nikki Dom, M.D. FACC surgan          . CHOLECYSTECTOMY  1999  . CORONARY ARTERY BYPASS GRAFT  12/10/2011   Procedure: CORONARY ARTERY  BYPASS GRAFTING (CABG);  Surgeon: Tharon Aquas Adelene Idler, MD;  Location: [place];  Service: Open Heart Surgery;  Laterality: N/A;  Coronary Artery bypass graft on pump times four utilizing left internal mammary artery and left saphenous vein harvested endoscopically  . LEFT HEART CATHETERIZATION WITH CORONARY ANGIOGRAM N/A 12/09/2011   Procedure: LEFT HEART CATHETERIZATION WITH CORONARY ANGIOGRAM;  Surgeon: Ailish Prospero M Martinique, MD;  Location: [facility] CATH LAB;  Service: Cardiovascular;  Laterality: N/A;    History  Smoking Status  . Former Smoker  . Packs/day: 1.50  . Years: 20.00  . Types: Cigarettes  . Quit date: 05/27/1979  Smokeless Tobacco  . Former User    History  Alcohol Use  . 2.4 oz/week  . 4  Shots of liquor per week    Comment: 4 "canadian clubs each night"    Family History  Problem Relation Age of Onset  . Stroke Mother 78    Cerebellar hemorrhage    Review of Systems: The review of systems is positive for neuropathy of his feet.  All other systems were reviewed and are negative.  Physical Exam: BP (!) 159/69 (BP Location: Left Arm)   Pulse 92   Ht 5\' 9"  (1.753 m)   Wt 193 lb 9.6 oz (87.8 kg)   BMI 28.59 kg/m  Patient is  pleasant and in no acute distress. Skin is warm and dry. Color is normal.  HEENT is unremarkable. Neck is supple. No masses. No JVD. Lungs are clear. Cardiac exam shows a regular rate and rhythm. Normal S1-2. no gallop or murmur. Abdomen is soft. No masses. Extremities are without edema.  Pedal pulses are poor without discoloration or ulcers. Gait and ROM are intact. No gross neurologic deficits noted.  LABORATORY DATA:  Echo: 6.7.16:Study Conclusions  - Left ventricle: Technically difficult study. Significant hypokinesis of the inferior wall. EF 45-50%. Doppler parameters are consistent with abnormal left ventricular relaxation (grade 1 diastolic dysfunction). - Mitral valve: There was mild regurgitation. - Right ventricle: RV is poorly seen. TAPSE is normal. I suspect RV is OK.  Labs from primary care April 07, 2016. Cholesterol 152, triglycerides 206, HDL 40, LDL 71. Creatinine and potassium normal. Patient reports last A1c 5.2%.   Assessment / Plan: 1. Coronary disease status post CABG. Doing very well- asymptomatic. We will continue with risk factor modification.  2. Diabetes mellitus type 2, controlled.  3. Hypertension- BP is consistently elevated. Patient refuses additional medication.   4. Hyperlipidemia  History of intolerance to statins. Last lipid panel satisfactory.  5. PAD. Arteriogram done in 2009. Last dopplers in March 2015. Will update now.Stable claudication. Encourage daily walking and checking feet daily for any  skin break down or sores.   6. Peripheral neuropathy.  7. Ischemic cardiomyopathy. EF 45-50% with inferior HK.  He is asymptomatic.  He is on appropriate therapy with ACEi and beta blocker.   I will follow up in 6 months.

## 2017-01-26 ENCOUNTER — Encounter: Payer: Self-pay | Admitting: Cardiology

## 2017-01-26 ENCOUNTER — Ambulatory Visit (INDEPENDENT_AMBULATORY_CARE_PROVIDER_SITE_OTHER): Payer: Medicare Other | Admitting: Cardiology

## 2017-01-26 VITALS — BP 159/69 | HR 92 | Ht 69.0 in | Wt 193.6 lb

## 2017-01-26 DIAGNOSIS — Z951 Presence of aortocoronary bypass graft: Secondary | ICD-10-CM

## 2017-01-26 DIAGNOSIS — I739 Peripheral vascular disease, unspecified: Secondary | ICD-10-CM | POA: Diagnosis not present

## 2017-01-26 DIAGNOSIS — I2510 Atherosclerotic heart disease of native coronary artery without angina pectoris: Secondary | ICD-10-CM | POA: Diagnosis not present

## 2017-01-26 DIAGNOSIS — I10 Essential (primary) hypertension: Secondary | ICD-10-CM

## 2017-01-26 DIAGNOSIS — E782 Mixed hyperlipidemia: Secondary | ICD-10-CM

## 2017-01-26 NOTE — Patient Instructions (Signed)
Continue your current therapy  We will repeat your lower extremity dopplers.

## 2017-02-02 ENCOUNTER — Other Ambulatory Visit: Payer: Self-pay | Admitting: Cardiology

## 2017-02-02 MED ORDER — LISINOPRIL 40 MG PO TABS: 40.0000 mg | ORAL_TABLET | Freq: Every day | ORAL | 3 refills | Status: DC

## 2017-02-02 NOTE — Telephone Encounter (Signed)
Rx(s) sent to pharmacy electronically.  

## 2017-02-02 NOTE — Telephone Encounter (Signed)
New message        *STAT* If patient is at the pharmacy, call can be transferred to refill team.   1. Which medications need to be refilled? (please list name of each medication and dose if known)  lisinopril 40mg   2. Which pharmacy/location (including street and city if local pharmacy) is medication to be sent to[facility]  3. Do they need a 30 day or 90 day supply? 90 day supply  They have old presc from dr wilkins-------please send in new presc

## 2017-02-14 ENCOUNTER — Other Ambulatory Visit: Payer: Self-pay | Admitting: Cardiology

## 2017-02-14 DIAGNOSIS — I739 Peripheral vascular disease, unspecified: Secondary | ICD-10-CM

## 2017-02-16 ENCOUNTER — Ambulatory Visit (HOSPITAL_COMMUNITY)
Admission: RE | Admit: 2017-02-16 | Discharge: 2017-02-16 | Disposition: A | Discharge status: Home / Self Care | Payer: Medicare Other | Source: Ambulatory Visit | Attending: Cardiovascular Disease | Admitting: Cardiovascular Disease

## 2017-02-16 DIAGNOSIS — I739 Peripheral vascular disease, unspecified: Secondary | ICD-10-CM

## 2017-07-28 NOTE — Progress Notes (Signed)
[name] Date of Birth: 01-19-1941 Medical Record #[id number]  History of Present Illness: [name] is seen today for followup of CAD. He had CABG x 4 in February 2013.  EF was normal.  Other problems include HTN, DM, PVD, and HLD. 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  Echo in June 2016 showed EF 45-50% with inferior HK. LE arterial dopplers done in April 2018 were stable compared to 2015. On a prior visit  we recommended adding HCTZ  for uncontrolled HTN. He states he never took it due to concerns about his kidney function and didn't want any additional therapy.   On follow up today he feels well. He has chronic pain  in his legs with walking R>L but he states this has not changed in several years.  He has chronic ankle edema and often uses support hose.  Doesn't do a lot of walking.   No chest pain or SOB.   Current Outpatient Prescriptions on File Prior to Visit  Medication Sig Dispense Refill  . amLODipine (NORVASC) 10 MG tablet Take 10 mg by mouth daily.    Marland Kitchen aspirin 325 MG tablet Take 325 mg by mouth daily.    . cilostazol (PLETAL) 100 MG tablet TAKE 1 TABLET TWICE A DAY 180 tablet 2  . lisinopril (PRINIVIL,ZESTRIL) 40 MG tablet Take 1 tablet (40 mg total) by mouth daily. 90 tablet 3  . metFORMIN (GLUCOPHAGE) 1000 MG tablet Take 1 tablet (1,000 mg total) by mouth 2 (two) times daily with a meal.    . metoprolol (LOPRESSOR) 100 MG tablet Take 1 tablet by mouth daily.  0  . [DISCONTINUED] furosemide (LASIX) 40 MG tablet Take 1 tablet (40 mg total) by mouth daily. For 4 days then stop. 4 tablet 0  . [DISCONTINUED] potassium chloride SA (K-DUR,KLOR-CON) 20 MEQ tablet Take 1 tablet (20 mEq total) by mouth daily. For 4 days the stop. 4 tablet 0   No current facility-administered medications on file prior to visit.     Allergies  Allergen Reactions  . Statins Other (See Comments)    Myalgias, muscle weakness    Past Medical History:  Diagnosis Date  . CAD (coronary artery  disease)    a. s/p CABG x 29 Nov 2011 with LIMA to LAD, SVG to OM1 and distal LCX, SVG to ramus intermediate  . Essential hypertension   . Hyperlipidemia   . Intermittent claudication ([place])   . Ischemic cardiomyopathy    a. 03/2015 Echo: EF45-50%, Gr1 DD, mild MR.  Marland Kitchen PAF (paroxysmal atrial fibrillation) ([place])    a. post op atrial fib 11/2011; short course of amiodarone; stopped 01/25/12  . Peripheral arterial disease ([place])   . Right bundle branch block   . Type II diabetes mellitus ([place])     Past Surgical History:  Procedure Laterality Date  . CARDIAC CATHETERIZATION  01/16/1999   Est. EF of 65% -- Nonobstruction atherosclerotic coronary artery disease -- Normal left ventricular function      . CARDIAC ELECTROPHYSIOLOGY STUDY AND ABLATION  05/21/1999   Normal sinus funtion -- Mildly prolonged interatrial conduction times -- Normal A-V node funtion -- Normal His Purkinje system function -- fNo accessory pathway -- No inducible ventricular tachycardia in the presence of the or in the absence of isoproterenol with programmed stimulation or with burst pacing -- Nikki Dom, M.D. FACC surgan          . CHOLECYSTECTOMY  1999  . CORONARY ARTERY BYPASS GRAFT  12/10/2011   Procedure: CORONARY ARTERY BYPASS GRAFTING (CABG);  Surgeon: Tharon Aquas Adelene Idler, MD;  Location: [place];  Service: Open Heart Surgery;  Laterality: N/A;  Coronary Artery bypass graft on pump times four utilizing left internal mammary artery and left saphenous vein harvested endoscopically  . LEFT HEART CATHETERIZATION WITH CORONARY ANGIOGRAM N/A 12/09/2011   Procedure: LEFT HEART CATHETERIZATION WITH CORONARY ANGIOGRAM;  Surgeon: Peter M Martinique, MD;  Location: [facility] CATH LAB;  Service: Cardiovascular;  Laterality: N/A;    History  Smoking Status  . Former Smoker  . Packs/day: 1.50  . Years: 20.00  . Types: Cigarettes  . Quit date: 05/27/1979  Smokeless Tobacco  . Former User    History  Alcohol Use  . 2.4 oz/week  . 4  Shots of liquor per week    Comment: 4 "canadian clubs each night"    Family History  Problem Relation Age of Onset  . Stroke Mother 42       Cerebellar hemorrhage    Review of Systems: The review of systems is positive for neuropathy of his feet.  All other systems were reviewed and are negative.  Physical Exam: BP (!) 156/67   Pulse 75   Ht 5\' 9"  (1.753 m)   Wt 193 lb 3.2 oz (87.6 kg)   BMI 28.53 kg/m  GENERAL:  Well appearing, overweight WM in NAD HEENT:  PERRL, EOMI, sclera are clear. Oropharynx is clear. NECK:  No jugular venous distention, carotid upstroke brisk and symmetric, no bruits, no thyromegaly or adenopathy LUNGS:  Clear to auscultation bilaterally CHEST:  Unremarkable HEART:  RRR,  PMI not displaced or sustained,S1 and S2 within normal limits, no S3, no S4: no clicks, no rubs, no murmurs ABD:  Soft, nontender. BS +, no masses or bruits. No hepatomegaly, no splenomegaly EXT:  Poor pedal pulses. 2+ edema localized to ankles.  SKIN:  Warm and dry.  No rashes NEURO:  Alert and oriented x 3. Cranial nerves II through XII intact. PSYCH:  Cognitively intact    LABORATORY DATA:   Ecg today shows NSR, LAD, LVH with repolarization abnormality. I have personally reviewed and interpreted this study.  Echo: 6.7.16:Study Conclusions  - Left ventricle: Technically difficult study. Significant hypokinesis of the inferior wall. EF 45-50%. Doppler parameters are consistent with abnormal left ventricular relaxation (grade 1 diastolic dysfunction). - Mitral valve: There was mild regurgitation. - Right ventricle: RV is poorly seen. TAPSE is normal. I suspect RV is OK.  Labs from primary care April 07, 2016. Cholesterol 152, triglycerides 206, HDL 40, LDL 71. Creatinine and potassium normal. Patient reports last A1c 5.2%.   Assessment / Plan: 1. Coronary disease status post CABG. Doing  well- asymptomatic. We will continue with ASA, metoprolol, amlodipine.  Intolerant to statins.  2. Diabetes mellitus type 2, controlled.  3. Hypertension- BP is  elevated. Patient refuses additional medication.   4. Hyperlipidemia  History of intolerance to statins. Labs followed by primary care.   5. PAD. Arteriogram done in 2009. Last dopplers in April 2018 unchanged from 2015.  Chronic Stable claudication. Patient does take good care of his feet. Doesn't walk more. Is really not interested in pursuing PV evaluation.   6. Peripheral neuropathy.  7. Ischemic cardiomyopathy. EF 45-50% with inferior HK.  He is asymptomatic.  He is on appropriate therapy with ACEi and beta blocker.   I will follow up in 6 months.

## 2017-08-01 ENCOUNTER — Ambulatory Visit (INDEPENDENT_AMBULATORY_CARE_PROVIDER_SITE_OTHER): Payer: Medicare Other | Admitting: Cardiology

## 2017-08-01 ENCOUNTER — Encounter: Payer: Self-pay | Admitting: Cardiology

## 2017-08-01 VITALS — BP 156/67 | HR 75 | Ht 69.0 in | Wt 193.2 lb

## 2017-08-01 DIAGNOSIS — I2510 Atherosclerotic heart disease of native coronary artery without angina pectoris: Secondary | ICD-10-CM

## 2017-08-01 DIAGNOSIS — I10 Essential (primary) hypertension: Secondary | ICD-10-CM | POA: Diagnosis not present

## 2017-08-01 DIAGNOSIS — Z951 Presence of aortocoronary bypass graft: Secondary | ICD-10-CM

## 2017-08-01 DIAGNOSIS — I4510 Unspecified right bundle-branch block: Secondary | ICD-10-CM | POA: Diagnosis not present

## 2017-08-01 DIAGNOSIS — I739 Peripheral vascular disease, unspecified: Secondary | ICD-10-CM

## 2017-08-01 DIAGNOSIS — E782 Mixed hyperlipidemia: Secondary | ICD-10-CM

## 2017-08-01 NOTE — Addendum Note (Signed)
Addended by: Kathyrn Lass on: 08/01/2017 10:58 AM   Modules accepted: Orders

## 2017-08-01 NOTE — Patient Instructions (Signed)
Continue your current therapy  I will see you in 6 months.   

## 2017-09-23 ENCOUNTER — Other Ambulatory Visit: Payer: Self-pay | Admitting: Cardiology

## 2018-02-02 ENCOUNTER — Encounter: Payer: Self-pay | Admitting: Cardiology

## 2018-02-06 NOTE — Progress Notes (Signed)
Steve [name]. Date of Birth: 03-27-1941 Medical Record #[id number]  History of Present Illness: Steve [name] is seen today for followup of CAD. He had CABG x 4 in February 2013.  EF was normal.  Other problems include HTN, DM, PVD, and HLD. 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  Echo in June 2016 showed EF 45-50% with inferior HK. LE arterial dopplers done in April 2018 were stable compared to 2015.    On follow up today he feels well. He sold his house and has moved in with his son.  He has chronic pain  in his legs with walking R>L but he states this has not changed in several years.  He has chronic ankle edema and often uses support hose.  Doesn't do a lot of walking. Enjoys playing pool at the [employer].   No chest pain or SOB. Reports last A1c in Dec. Was 4.9% and metformin dose reduced. Thinks cholesterol was OK.   Current Outpatient Medications on File Prior to Visit  Medication Sig Dispense Refill  . amLODipine (NORVASC) 10 MG tablet Take 10 mg by mouth daily.    Marland Kitchen aspirin 325 MG tablet Take 325 mg by mouth daily.    . cilostazol (PLETAL) 100 MG tablet TAKE 1 TABLET TWICE A DAY 180 tablet 2  . lisinopril (PRINIVIL,ZESTRIL) 40 MG tablet Take 1 tablet (40 mg total) by mouth daily. 90 tablet 3  . metFORMIN (GLUCOPHAGE) 1000 MG tablet Take 500 mg by mouth 2 (two) times daily with a meal.    . metoprolol (LOPRESSOR) 100 MG tablet Take 1 tablet by mouth daily.  0  . [DISCONTINUED] furosemide (LASIX) 40 MG tablet Take 1 tablet (40 mg total) by mouth daily. For 4 days then stop. 4 tablet 0  . [DISCONTINUED] potassium chloride SA (K-DUR,KLOR-CON) 20 MEQ tablet Take 1 tablet (20 mEq total) by mouth daily. For 4 days the stop. 4 tablet 0   No current facility-administered medications on file prior to visit.     Allergies  Allergen Reactions  . Statins Other (See Comments)    Myalgias, muscle weakness    Past Medical History:  Diagnosis Date  . CAD (coronary artery disease)    a.  s/p CABG x 29 Nov 2011 with LIMA to LAD, SVG to OM1 and distal LCX, SVG to ramus intermediate  . Essential hypertension   . Hyperlipidemia   . Intermittent claudication ([place])   . Ischemic cardiomyopathy    a. 03/2015 Echo: EF45-50%, Gr1 DD, mild MR.  Marland Kitchen PAF (paroxysmal atrial fibrillation) ([name])    a. post op atrial fib 11/2011; short course of amiodarone; stopped 01/25/12  . Peripheral arterial disease ([place])   . Right bundle branch block   . Type II diabetes mellitus ([place])     Past Surgical History:  Procedure Laterality Date  . CARDIAC CATHETERIZATION  01/16/1999   Est. EF of 65% -- Nonobstruction atherosclerotic coronary artery disease -- Normal left ventricular function      . CARDIAC ELECTROPHYSIOLOGY STUDY AND ABLATION  05/21/1999   Normal sinus funtion -- Mildly prolonged interatrial conduction times -- Normal A-V node funtion -- Normal His Purkinje system function -- fNo accessory pathway -- No inducible ventricular tachycardia in the presence of the or in the absence of isoproterenol with programmed stimulation or with burst pacing -- Nikki Dom, M.D. FACC surgan          . CHOLECYSTECTOMY  1999  . CORONARY ARTERY BYPASS GRAFT  12/10/2011   Procedure: CORONARY ARTERY BYPASS GRAFTING (CABG);  Surgeon: Tharon Aquas Adelene Idler, MD;  Location: [facility];  Service: Open Heart Surgery;  Laterality: N/A;  Coronary Artery bypass graft on pump times four utilizing left internal mammary artery and left saphenous vein harvested endoscopically  . LEFT HEART CATHETERIZATION WITH CORONARY ANGIOGRAM N/A 12/09/2011   Procedure: LEFT HEART CATHETERIZATION WITH CORONARY ANGIOGRAM;  Surgeon: Peter M Martinique, MD;  [facility];  Service: Cardiovascular;  Laterality: N/A;    Social History   Tobacco Use  Smoking Status Former Smoker  . Packs/day: 1.50  . Years: 20.00  . Pack years: 30.00  . Types: Cigarettes  . Last attempt to quit: 05/27/1979  . Years since quitting: 38.7  Smokeless Tobacco  Former Systems developer    Social History   Substance and Sexual Activity  Alcohol Use Yes  . Alcohol/week: 2.4 oz  . Types: 4 Shots of liquor per week   Comment: 4 "[employer] each night"    Family History  Problem Relation Age of Onset  . Stroke Mother 52       Cerebellar hemorrhage    Review of Systems: The review of systems is positive for neuropathy of his feet.  All other systems were reviewed and are negative.  Physical Exam: BP (!) 142/62   Pulse 78   Ht 5\' 9"  (1.753 m)   Wt 194 lb 6.4 oz (88.2 kg)   BMI 28.71 kg/m  GENERAL:  Well appearing,  WM in NAD HEENT:  PERRL, EOMI, sclera are clear. Oropharynx is clear. NECK:  No jugular venous distention, carotid upstroke brisk and symmetric, no bruits, no thyromegaly or adenopathy LUNGS:  Clear to auscultation bilaterally CHEST:  Unremarkable HEART:  RRR,  PMI not displaced or sustained,S1 and S2 within normal limits, no S3, no S4: no clicks, no rubs, no murmurs ABD:  Soft, nontender. BS +, no masses or bruits. No hepatomegaly, no splenomegaly EXT:  Poor pedal pulses. 1+ edema localized to ankles.  SKIN:  Warm and dry.  No rashes NEURO:  Alert and oriented x 3. Cranial nerves II through XII intact. PSYCH:  Cognitively intact    LABORATORY DATA:   Echo: 6.7.16:Study Conclusions  - Left ventricle: Technically difficult study. Significant hypokinesis of the inferior wall. EF 45-50%. Doppler parameters are consistent with abnormal left ventricular relaxation (grade 1 diastolic dysfunction). - Mitral valve: There was mild regurgitation. - Right ventricle: RV is poorly seen. TAPSE is normal. I suspect RV is OK.  Labs from primary care April 07, 2016. Cholesterol 152, triglycerides 206, HDL 40, LDL 71. Creatinine and potassium normal. Patient reports last A1c 5.2%.   Assessment / Plan: 1. Coronary disease status post CABG. Doing  well- asymptomatic. We will continue with ASA, metoprolol, amlodipine. Intolerant to  statins.  2. Diabetes mellitus type 2, excellent control.  3. Hypertension- BP control has improved. Continue current therapy  4. Hyperlipidemia  History of intolerance to statins. Labs followed by primary care.   5. PAD. Arteriogram done in 2009. Last dopplers in April 2018 unchanged from 2015.  Chronic Stable claudication. Patient does take good care of his feet. Continue to monitor.  6. Peripheral neuropathy.  7. Ischemic cardiomyopathy. EF 45-50% with inferior HK.  He is asymptomatic.  He is on appropriate therapy with ACEi and beta blocker.   I will follow up in 6 months.

## 2018-02-08 ENCOUNTER — Ambulatory Visit (INDEPENDENT_AMBULATORY_CARE_PROVIDER_SITE_OTHER): Payer: Medicare Other | Admitting: Cardiology

## 2018-02-08 ENCOUNTER — Encounter: Payer: Self-pay | Admitting: Cardiology

## 2018-02-08 VITALS — BP 142/62 | HR 78 | Ht 69.0 in | Wt 194.4 lb

## 2018-02-08 DIAGNOSIS — I10 Essential (primary) hypertension: Secondary | ICD-10-CM | POA: Diagnosis not present

## 2018-02-08 DIAGNOSIS — Z951 Presence of aortocoronary bypass graft: Secondary | ICD-10-CM

## 2018-02-08 DIAGNOSIS — I2510 Atherosclerotic heart disease of native coronary artery without angina pectoris: Secondary | ICD-10-CM | POA: Diagnosis not present

## 2018-02-08 DIAGNOSIS — I739 Peripheral vascular disease, unspecified: Secondary | ICD-10-CM

## 2018-02-08 DIAGNOSIS — E7800 Pure hypercholesterolemia, unspecified: Secondary | ICD-10-CM | POA: Diagnosis not present

## 2018-02-08 NOTE — Patient Instructions (Signed)
Continue your current therapy  I will see you in 6 months.   

## 2018-02-20 ENCOUNTER — Other Ambulatory Visit: Payer: Self-pay | Admitting: *Deleted

## 2018-02-20 MED ORDER — LISINOPRIL 40 MG PO TABS: 40.0000 mg | ORAL_TABLET | Freq: Every day | ORAL | 1 refills | Status: DC

## 2018-05-11 ENCOUNTER — Other Ambulatory Visit: Payer: Self-pay | Admitting: Family Medicine

## 2018-05-11 DIAGNOSIS — M79604 Pain in right leg: Secondary | ICD-10-CM

## 2018-05-11 DIAGNOSIS — M7989 Other specified soft tissue disorders: Secondary | ICD-10-CM

## 2018-05-12 ENCOUNTER — Ambulatory Visit
Admission: RE | Admit: 2018-05-12 | Discharge: 2018-05-12 | Disposition: A | Discharge status: Home / Self Care | Payer: Medicare Other | Source: Ambulatory Visit | Attending: Family Medicine | Admitting: Family Medicine

## 2018-05-12 DIAGNOSIS — M7989 Other specified soft tissue disorders: Secondary | ICD-10-CM

## 2018-05-12 DIAGNOSIS — M79604 Pain in right leg: Secondary | ICD-10-CM

## 2018-06-22 ENCOUNTER — Other Ambulatory Visit: Payer: Self-pay | Admitting: Cardiology

## 2018-06-23 NOTE — Telephone Encounter (Signed)
Rx sent to pharmacy   

## 2018-07-17 ENCOUNTER — Telehealth: Payer: Self-pay | Admitting: Cardiology

## 2018-07-17 MED ORDER — LISINOPRIL 40 MG PO TABS: 40.0000 mg | ORAL_TABLET | Freq: Every day | ORAL | 0 refills | Status: DC

## 2018-07-17 NOTE — Telephone Encounter (Signed)
Rx(s) sent to pharmacy electronically.  

## 2018-07-19 ENCOUNTER — Other Ambulatory Visit: Payer: Self-pay | Admitting: *Deleted

## 2018-07-19 ENCOUNTER — Encounter: Payer: Self-pay | Admitting: Hematology and Oncology

## 2018-07-19 ENCOUNTER — Telehealth: Payer: Self-pay | Admitting: Hematology and Oncology

## 2018-07-19 NOTE — Telephone Encounter (Signed)
New hem appt has een scheduled for the pt to see Dr. Audelia Hives on 9/30 at 1pm. Pt aware to arrive 30 minutes early. Letter and directions mailed.

## 2018-07-24 ENCOUNTER — Telehealth: Payer: Self-pay | Admitting: Hematology and Oncology

## 2018-07-24 ENCOUNTER — Emergency Department (HOSPITAL_COMMUNITY): Payer: Medicare Other

## 2018-07-24 ENCOUNTER — Encounter (HOSPITAL_COMMUNITY): Payer: Self-pay | Admitting: Emergency Medicine

## 2018-07-24 ENCOUNTER — Inpatient Hospital Stay: Payer: Medicare Other | Attending: Hematology and Oncology | Admitting: Hematology and Oncology

## 2018-07-24 ENCOUNTER — Telehealth: Payer: Self-pay | Admitting: *Deleted

## 2018-07-24 ENCOUNTER — Encounter: Payer: Self-pay | Admitting: Hematology and Oncology

## 2018-07-24 ENCOUNTER — Other Ambulatory Visit: Payer: Self-pay

## 2018-07-24 ENCOUNTER — Inpatient Hospital Stay: Payer: Medicare Other

## 2018-07-24 ENCOUNTER — Inpatient Hospital Stay (HOSPITAL_COMMUNITY)
Admission: EM | Admit: 2018-07-24 | Discharge: 2018-07-29 | DRG: 683 | Disposition: A | Discharge status: Home / Self Care | Payer: Medicare Other | Attending: Oncology | Admitting: Oncology

## 2018-07-24 VITALS — BP 186/69 | HR 60 | Temp 98.9°F | Resp 19 | Ht 69.0 in | Wt 196.8 lb

## 2018-07-24 DIAGNOSIS — I452 Bifascicular block: Secondary | ICD-10-CM | POA: Diagnosis present

## 2018-07-24 DIAGNOSIS — T447X5A Adverse effect of beta-adrenoreceptor antagonists, initial encounter: Secondary | ICD-10-CM | POA: Diagnosis present

## 2018-07-24 DIAGNOSIS — I5022 Chronic systolic (congestive) heart failure: Secondary | ICD-10-CM | POA: Diagnosis present

## 2018-07-24 DIAGNOSIS — I2510 Atherosclerotic heart disease of native coronary artery without angina pectoris: Secondary | ICD-10-CM | POA: Diagnosis present

## 2018-07-24 DIAGNOSIS — E876 Hypokalemia: Secondary | ICD-10-CM | POA: Insufficient documentation

## 2018-07-24 DIAGNOSIS — F329 Major depressive disorder, single episode, unspecified: Secondary | ICD-10-CM | POA: Diagnosis present

## 2018-07-24 DIAGNOSIS — E1151 Type 2 diabetes mellitus with diabetic peripheral angiopathy without gangrene: Secondary | ICD-10-CM | POA: Diagnosis present

## 2018-07-24 DIAGNOSIS — I255 Ischemic cardiomyopathy: Secondary | ICD-10-CM | POA: Diagnosis present

## 2018-07-24 DIAGNOSIS — Z79899 Other long term (current) drug therapy: Secondary | ICD-10-CM

## 2018-07-24 DIAGNOSIS — R45851 Suicidal ideations: Secondary | ICD-10-CM | POA: Diagnosis not present

## 2018-07-24 DIAGNOSIS — D509 Iron deficiency anemia, unspecified: Secondary | ICD-10-CM | POA: Insufficient documentation

## 2018-07-24 DIAGNOSIS — Z951 Presence of aortocoronary bypass graft: Secondary | ICD-10-CM | POA: Diagnosis not present

## 2018-07-24 DIAGNOSIS — R3911 Hesitancy of micturition: Secondary | ICD-10-CM | POA: Diagnosis present

## 2018-07-24 DIAGNOSIS — E1122 Type 2 diabetes mellitus with diabetic chronic kidney disease: Secondary | ICD-10-CM | POA: Diagnosis not present

## 2018-07-24 DIAGNOSIS — D649 Anemia, unspecified: Secondary | ICD-10-CM

## 2018-07-24 DIAGNOSIS — I82512 Chronic embolism and thrombosis of left femoral vein: Secondary | ICD-10-CM | POA: Diagnosis present

## 2018-07-24 DIAGNOSIS — M109 Gout, unspecified: Secondary | ICD-10-CM | POA: Diagnosis present

## 2018-07-24 DIAGNOSIS — Z8673 Personal history of transient ischemic attack (TIA), and cerebral infarction without residual deficits: Secondary | ICD-10-CM | POA: Diagnosis not present

## 2018-07-24 DIAGNOSIS — E872 Acidosis, unspecified: Secondary | ICD-10-CM

## 2018-07-24 DIAGNOSIS — E871 Hypo-osmolality and hyponatremia: Secondary | ICD-10-CM | POA: Diagnosis not present

## 2018-07-24 DIAGNOSIS — L03116 Cellulitis of left lower limb: Secondary | ICD-10-CM

## 2018-07-24 DIAGNOSIS — I70245 Atherosclerosis of native arteries of left leg with ulceration of other part of foot: Secondary | ICD-10-CM | POA: Diagnosis not present

## 2018-07-24 DIAGNOSIS — N189 Chronic kidney disease, unspecified: Secondary | ICD-10-CM

## 2018-07-24 DIAGNOSIS — R001 Bradycardia, unspecified: Secondary | ICD-10-CM | POA: Diagnosis present

## 2018-07-24 DIAGNOSIS — L97528 Non-pressure chronic ulcer of other part of left foot with other specified severity: Secondary | ICD-10-CM | POA: Diagnosis not present

## 2018-07-24 DIAGNOSIS — X58XXXA Exposure to other specified factors, initial encounter: Secondary | ICD-10-CM | POA: Diagnosis present

## 2018-07-24 DIAGNOSIS — Z823 Family history of stroke: Secondary | ICD-10-CM

## 2018-07-24 DIAGNOSIS — I5020 Unspecified systolic (congestive) heart failure: Secondary | ICD-10-CM | POA: Diagnosis not present

## 2018-07-24 DIAGNOSIS — L0390 Cellulitis, unspecified: Secondary | ICD-10-CM | POA: Diagnosis not present

## 2018-07-24 DIAGNOSIS — Z7984 Long term (current) use of oral hypoglycemic drugs: Secondary | ICD-10-CM

## 2018-07-24 DIAGNOSIS — N179 Acute kidney failure, unspecified: Secondary | ICD-10-CM | POA: Diagnosis present

## 2018-07-24 DIAGNOSIS — I480 Paroxysmal atrial fibrillation: Secondary | ICD-10-CM | POA: Diagnosis present

## 2018-07-24 DIAGNOSIS — I130 Hypertensive heart and chronic kidney disease with heart failure and stage 1 through stage 4 chronic kidney disease, or unspecified chronic kidney disease: Secondary | ICD-10-CM | POA: Diagnosis present

## 2018-07-24 DIAGNOSIS — Z7901 Long term (current) use of anticoagulants: Secondary | ICD-10-CM

## 2018-07-24 DIAGNOSIS — R351 Nocturia: Secondary | ICD-10-CM | POA: Diagnosis present

## 2018-07-24 DIAGNOSIS — E785 Hyperlipidemia, unspecified: Secondary | ICD-10-CM | POA: Diagnosis present

## 2018-07-24 DIAGNOSIS — D696 Thrombocytopenia, unspecified: Secondary | ICD-10-CM | POA: Diagnosis present

## 2018-07-24 DIAGNOSIS — I739 Peripheral vascular disease, unspecified: Secondary | ICD-10-CM

## 2018-07-24 DIAGNOSIS — E11621 Type 2 diabetes mellitus with foot ulcer: Secondary | ICD-10-CM | POA: Diagnosis present

## 2018-07-24 DIAGNOSIS — Z86718 Personal history of other venous thrombosis and embolism: Secondary | ICD-10-CM | POA: Insufficient documentation

## 2018-07-24 DIAGNOSIS — N182 Chronic kidney disease, stage 2 (mild): Secondary | ICD-10-CM | POA: Diagnosis present

## 2018-07-24 DIAGNOSIS — E875 Hyperkalemia: Secondary | ICD-10-CM | POA: Diagnosis present

## 2018-07-24 DIAGNOSIS — I4510 Unspecified right bundle-branch block: Secondary | ICD-10-CM | POA: Diagnosis present

## 2018-07-24 DIAGNOSIS — L97529 Non-pressure chronic ulcer of other part of left foot with unspecified severity: Secondary | ICD-10-CM | POA: Diagnosis present

## 2018-07-24 DIAGNOSIS — Z9049 Acquired absence of other specified parts of digestive tract: Secondary | ICD-10-CM

## 2018-07-24 DIAGNOSIS — I772 Rupture of artery: Secondary | ICD-10-CM | POA: Diagnosis not present

## 2018-07-24 DIAGNOSIS — I5042 Chronic combined systolic (congestive) and diastolic (congestive) heart failure: Secondary | ICD-10-CM | POA: Diagnosis not present

## 2018-07-24 DIAGNOSIS — T464X5A Adverse effect of angiotensin-converting-enzyme inhibitors, initial encounter: Secondary | ICD-10-CM | POA: Diagnosis present

## 2018-07-24 DIAGNOSIS — R195 Other fecal abnormalities: Secondary | ICD-10-CM | POA: Diagnosis present

## 2018-07-24 DIAGNOSIS — Z87891 Personal history of nicotine dependence: Secondary | ICD-10-CM

## 2018-07-24 DIAGNOSIS — I700 Atherosclerosis of aorta: Secondary | ICD-10-CM | POA: Diagnosis present

## 2018-07-24 HISTORY — DX: Acidosis, unspecified: E87.20

## 2018-07-24 LAB — COMPREHENSIVE METABOLIC PANEL
ALK PHOS: 59 U/L (ref 38–126)
ALT: 13 U/L (ref 0–44)
ALT: 13 U/L (ref 0–44)
ANION GAP: 7 (ref 5–15)
AST: 9 U/L — AB (ref 15–41)
AST: 12 U/L — ABNORMAL LOW (ref 15–41)
Albumin: 3.7 g/dL (ref 3.5–5.0)
Albumin: 3.9 g/dL (ref 3.5–5.0)
Alkaline Phosphatase: 64 U/L (ref 38–126)
Anion gap: 7 (ref 5–15)
BILIRUBIN TOTAL: 0.4 mg/dL (ref 0.3–1.2)
BILIRUBIN TOTAL: 0.6 mg/dL (ref 0.3–1.2)
BUN: 54 mg/dL — AB (ref 8–23)
BUN: 51 mg/dL — ABNORMAL HIGH (ref 8–23)
CALCIUM: 8.9 mg/dL (ref 8.9–10.3)
CHLORIDE: 109 mmol/L (ref 98–111)
CO2: 17 mmol/L — ABNORMAL LOW (ref 22–32)
CO2: 16 mmol/L — AB (ref 22–32)
CREATININE: 3.32 mg/dL — AB (ref 0.61–1.24)
CREATININE: 3.31 mg/dL — AB (ref 0.61–1.24)
Calcium: 8.8 mg/dL — ABNORMAL LOW (ref 8.9–10.3)
Chloride: 111 mmol/L (ref 98–111)
GFR, EST AFRICAN AMERICAN: 19 mL/min — AB (ref >60)
GFR, EST AFRICAN AMERICAN: 19 mL/min — AB (ref >60)
GFR, EST NON AFRICAN AMERICAN: 16 mL/min — AB (ref >60)
GFR, EST NON AFRICAN AMERICAN: 16 mL/min — AB (ref >60)
Glucose, Bld: 121 mg/dL — ABNORMAL HIGH (ref 70–99)
Glucose, Bld: 126 mg/dL — ABNORMAL HIGH (ref 70–99)
POTASSIUM: 5.5 mmol/L — AB (ref 3.5–5.1)
Potassium: 5.5 mmol/L — ABNORMAL HIGH (ref 3.5–5.1)
SODIUM: 134 mmol/L — AB (ref 135–145)
Sodium: 133 mmol/L — ABNORMAL LOW (ref 135–145)
TOTAL PROTEIN: 6.8 g/dL (ref 6.5–8.1)
TOTAL PROTEIN: 6.6 g/dL (ref 6.5–8.1)

## 2018-07-24 LAB — I-STAT VENOUS BLOOD GAS, ED
Acid-base deficit: 11 mmol/L — ABNORMAL HIGH (ref 0.0–2.0)
BICARBONATE: 15.1 mmol/L — AB (ref 20.0–28.0)
O2 Saturation: 25 %
PH VEN: 7.277 (ref 7.250–7.430)
TCO2: 16 mmol/L — ABNORMAL LOW (ref 22–32)
pCO2, Ven: 32.4 mmHg — ABNORMAL LOW (ref 44.0–60.0)
pO2, Ven: 19 mmHg — CL (ref 32.0–45.0)

## 2018-07-24 LAB — PROTIME-INR
INR: 1.3
Prothrombin Time: 16.1 seconds — ABNORMAL HIGH (ref 11.4–15.2)

## 2018-07-24 LAB — CBC (CANCER CENTER ONLY)
HEMATOCRIT: 28.6 % — AB (ref 38.4–49.9)
HEMOGLOBIN: 9.9 g/dL — AB (ref 13.0–17.1)
MCH: 30.8 pg (ref 27.2–33.4)
MCHC: 34.6 g/dL (ref 32.0–36.0)
MCV: 89.1 fL (ref 79.3–98.0)
Platelet Count: 128 10*3/uL — ABNORMAL LOW (ref 140–400)
RBC: 3.21 MIL/uL — ABNORMAL LOW (ref 4.20–5.82)
RDW: 15.6 % — ABNORMAL HIGH (ref 11.0–14.6)
WBC Count: 6.8 10*3/uL (ref 4.0–10.3)

## 2018-07-24 LAB — I-STAT TROPONIN, ED: Troponin i, poc: 0.02 ng/mL (ref 0.00–0.08)

## 2018-07-24 LAB — RETICULOCYTES
RBC.: 3.21 MIL/uL — ABNORMAL LOW (ref 4.20–5.82)
RETIC CT PCT: 2.8 % — AB (ref 0.8–1.8)
Retic Count, Absolute: 89.9 10*3/uL (ref 34.8–93.9)

## 2018-07-24 LAB — CBC
HCT: 29.2 % — ABNORMAL LOW (ref 39.0–52.0)
Hemoglobin: 9.7 g/dL — ABNORMAL LOW (ref 13.0–17.0)
MCH: 30.7 pg (ref 26.0–34.0)
MCHC: 33.2 g/dL (ref 30.0–36.0)
MCV: 92.4 fL (ref 78.0–100.0)
PLATELETS: 152 10*3/uL (ref 150–400)
RBC: 3.16 MIL/uL — ABNORMAL LOW (ref 4.22–5.81)
RDW: 15 % (ref 11.5–15.5)
WBC: 6.6 10*3/uL (ref 4.0–10.5)

## 2018-07-24 LAB — LACTATE DEHYDROGENASE: LDH: 122 U/L (ref 98–192)

## 2018-07-24 LAB — FERRITIN: Ferritin: 123 ng/mL (ref 24–336)

## 2018-07-24 LAB — IRON AND TIBC
IRON: 47 ug/dL (ref 42–163)
Saturation Ratios: 16 % — ABNORMAL LOW (ref 42–163)
TIBC: 285 ug/dL (ref 202–409)
UIBC: 238 ug/dL

## 2018-07-24 LAB — I-STAT CG4 LACTIC ACID, ED: Lactic Acid, Venous: 0.48 mmol/L — ABNORMAL LOW (ref 0.5–1.9)

## 2018-07-24 LAB — GLUCOSE, CAPILLARY: Glucose-Capillary: 101 mg/dL — ABNORMAL HIGH (ref 70–99)

## 2018-07-24 MED ORDER — CEFAZOLIN SODIUM-DEXTROSE 2-4 GM/100ML-% IV SOLN
2.0000 g | Freq: Two times a day (BID) | INTRAVENOUS | Status: DC
  Administered 2018-07-25 – 2018-07-26 (×4): 2 g via INTRAVENOUS
  Filled 2018-07-24 (×4): qty 100

## 2018-07-24 MED ORDER — SODIUM CHLORIDE 0.45 % IV SOLN
INTRAVENOUS | Status: DC
  Administered 2018-07-24: 20:00:00 via INTRAVENOUS

## 2018-07-24 MED ORDER — ACETAMINOPHEN 325 MG PO TABS
650.0000 mg | ORAL_TABLET | Freq: Four times a day (QID) | ORAL | Status: DC | PRN
  Filled 2018-07-24: qty 2

## 2018-07-24 MED ORDER — ACETAMINOPHEN 650 MG RE SUPP: 650.0000 mg | Freq: Four times a day (QID) | RECTAL | Status: DC | PRN

## 2018-07-24 MED ORDER — PIPERACILLIN-TAZOBACTAM 3.375 G IVPB 30 MIN
3.3750 g | Freq: Once | INTRAVENOUS | Status: AC
  Administered 2018-07-24: 3.375 g via INTRAVENOUS
  Filled 2018-07-24: qty 50

## 2018-07-24 MED ORDER — HEPARIN (PORCINE) IN NACL 100-0.45 UNIT/ML-% IJ SOLN
1550.0000 [IU]/h | INTRAMUSCULAR | Status: DC
  Administered 2018-07-25: 1200 [IU]/h via INTRAVENOUS
  Administered 2018-07-26: 1450 [IU]/h via INTRAVENOUS
  Administered 2018-07-27: 1550 [IU]/h via INTRAVENOUS
  Filled 2018-07-24 (×6): qty 250

## 2018-07-24 MED ORDER — SODIUM CHLORIDE 0.9% FLUSH
3.0000 mL | Freq: Two times a day (BID) | INTRAVENOUS | Status: DC
  Administered 2018-07-25 – 2018-07-29 (×6): 3 mL via INTRAVENOUS

## 2018-07-24 MED ORDER — INSULIN ASPART 100 UNIT/ML ~~LOC~~ SOLN
0.0000 [IU] | Freq: Three times a day (TID) | SUBCUTANEOUS | Status: DC
  Administered 2018-07-25: 1 [IU] via SUBCUTANEOUS

## 2018-07-24 NOTE — Patient Instructions (Signed)
July 24, 2018 We discussed in detail the results of your prior laboratory studies done at [facility] through Dr. Arelia Sneddon.  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  At the present time you are taking 1 iron tablet daily.  By your own admission, you have not been able to increase it to twice daily dosing on a consistent basis.  The good news is that you have been tolerant of oral iron, in that you have had no ill effects such as upset stomach, diarrhea, or constipation.  Laboratory studies will be obtained today to confirm the presence of anemia due to iron deficiency alone.  Those results are not available at the time of discharge.  Although we discussed intravenous iron replacement, as well as the advantages and disadvantages of intravenous iron versus oral iron intake, you decided to increase the oral iron supplement to 1 tablet twice daily for several days; followed by 1 tablet 3 times daily thereafter.  Do not take more than 3 tablets daily.  Barring any unforeseen complications, you are scheduled for repeat laboratory testing on November 11.  If there are problems that develop with regard to taking your iron supplements, or if you change your mind and want to replace your iron stores with intravenous iron, please call our office.  Please do not hesitate to call if any problems or questions arise in the interim.  It was a pleasure to meet with you.  Thank you! Ladona Ridgel, MD Hematology/Oncology [phone]

## 2018-07-24 NOTE — Telephone Encounter (Signed)
CMP results faxed to Dr Ranee Gosselin office.  Called Dr Tretha Sciara office and requested they call Dr Audelia Hives to discuss results.

## 2018-07-24 NOTE — Progress Notes (Signed)
[facility] Initial Consultation  Patient Name:  Steve [name]  DOB: 12-Dec-1940   Date of Service: July 24, 2018  Referring Provider: Leonard Downing, [place], [place] [address]   Consulting Physician: Henreitta Leber, MD Hematology/Oncology  Patient Care Team: Patient Care Team: Leonard Downing, MD  Reason for Referral: In the setting of anemia likely due to iron deficiency, currently on ferrous sulfate once daily, he presents now for further diagnostic and therapeutic recommendations.  History Present Illness: Steve, [name]. 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  His primary care physician is Dr.Wilson Arelia Sneddon, [facility].  He is also followed by Dr. Peter Martinique, cardiology.  Over the past several months, since March, he now lives with his son, daughter-in-law, and grandson.  His wife died of "dementia" 5 years ago.  He is alone at this first visit.  Although our outside records are limited, on July 13, 2018, a complete blood count showed hemoglobin 12.9 hematocrit 37.7 MCV 87 MCH 29.7 RDW 14.4 WBC 6.5 with 68% neutrophils 22% lymphocytes 5% monocytes 5%  eosinophils; platelets 164,000.  A comprehensive metabolic panel showed him 137 potassium 4.8 chloride 100 CO2 20 glucose 118 BUN 13 creatinine 1.48 calcium 9.0 total protein 7.0 albumin 4.4 total bilirubin 0.4 alkaline phosphatase 65 SGOT 13 SGPT 11.  Serum ferritin 27.  On August 15 the hemoglobin was repeated, it was 11.5 g/dL.  On September 11, complete blood count showed hemoglobin 11.8 hematocrit 34.3 MCV 90 MCH 30.9 WBC 6.2 with 75% neutrophils 13% lymphocytes 5% monocytes 1% eosinophils 1% basophils.  Uric acid 8.2.  In the distant past, Luiz was a heavy drinker while in the [employer] [phone]).  Until recently he had 2 drinks nightly, consisting of [employer].  He reports no cardiac dysrhythmia.  He has no seizure disorder or known stroke syndrome.  There is no thyroid disease.  He has no viral hepatitis, inflammatory bowel disease, or symptomatic diverticulosis.  He reports no peptic ulcer or gastroesophageal reflux disease.  He has diabetic nephropathy.  7 months earlier he had a urinary tract infection characterized by "burning."  He admits to degenerative joint disease involving the fingers bilaterally.  He has no former history of any blood disorders.  In July he had a deep vein thrombosis involving the left lower extremity.  Both his appetite and weight have remained stable.  He is not a vegetarian.  He eats twice daily.  He reports no rash or itching.  He bruises easily and has a laceration over the left lower forearm.  He reports no new visual changes or hearing deficit.  He denies cough, sore throat, orthopnea.  He has no dyspnea at rest.  He is chronic but stable exertional dyspnea unchanged over his usual baseline.  He denies any pain or difficulty in swallowing.  There is no fever, shaking chills, sweats, or flulike  symptoms.  He has no heartburn or indigestion.  He denies nausea, vomiting, diarrhea, or constipation.  He moves his bowels 1-2 times daily.  His most recent  colonoscopy was "at least 10 years earlier."  He cannot recall the endoscopist.  Since being on iron once daily for "several months,"  his stool is black.  No urinary frequency, urgency, hematuria, or dysuria are reported.  He has chronic stiffness in his fingers over the past 6 years.  His most recent flareup with gout was several months earlier involving mostly the left foot and greater left toe.  She still admits to pain in the lower extremities on a near constant basis worse when he walks, and is associated with numbness of the left foot and burning. He denies any numbness or tingling in the fingers.  He does experience positional dizziness and disequilibrium.  It is with this background he presents now for further diagnostic and therapeutic recommendations in the setting of anemia likely secondary to iron deficiency as outlined above.  Past Medical History:  Diagnosis Date  . CAD (coronary artery disease)    a. s/p CABG x 29 Nov 2011 with LIMA to LAD, SVG to OM1 and distal LCX, SVG to ramus intermediate  . Essential hypertension   . Hyperlipidemia   . Intermittent claudication ([place])   . Ischemic cardiomyopathy    a. 03/2015 Echo: EF45-50%, Gr1 DD, mild MR.  Marland Kitchen PAF (paroxysmal atrial fibrillation) ([place])    a. post op atrial fib 11/2011; short course of amiodarone; stopped 01/25/12  . Peripheral arterial disease ([place])   . Right bundle branch block   . Type II diabetes mellitus ([place])   Venous thromboembolic disease/DVT: Right lower extremity July, 2019  Past Surgical History:  Procedure Laterality Date  . CARDIAC CATHETERIZATION  01/16/1999   Est. EF of 65% -- Nonobstruction atherosclerotic coronary artery disease -- Normal left ventricular function      . CARDIAC ELECTROPHYSIOLOGY STUDY AND ABLATION  05/21/1999   Normal sinus funtion -- Mildly prolonged interatrial conduction times -- Normal A-V node funtion -- Normal His Purkinje system function -- fNo accessory pathway -- No inducible  ventricular tachycardia in the presence of the or in the absence of isoproterenol with programmed stimulation or with burst pacing -- Nikki Dom, M.D. FACC surgan          . CHOLECYSTECTOMY  1999  . CORONARY ARTERY BYPASS GRAFT  12/10/2011   Procedure: CORONARY ARTERY BYPASS GRAFTING (CABG);  Surgeon: Tharon Aquas Adelene Idler, MD;  Location: [facility];  Service: Open Heart Surgery;  Laterality: N/A;  Coronary Artery bypass graft on pump times four utilizing left internal mammary artery and left saphenous vein harvested endoscopically  . LEFT HEART CATHETERIZATION WITH CORONARY ANGIOGRAM N/A 12/09/2011   Procedure: LEFT HEART CATHETERIZATION WITH CORONARY ANGIOGRAM;  Surgeon: Peter M Martinique, MD;  Location: [facility] C[facility];  Service: Cardiovascular;  Laterality: N/A;   Family History  Problem Relation Age of Onset  . Stroke Mother 31       Cerebellar hemorrhage  Mother: Deceased: Age 24 years: Cerebral hemorrhage. Father: Deceased: Age 88 years: Accident Brothers (1): Deceased: COPD. He has no sisters  Social History   Socioeconomic History  . Marital status: Widowed    Spouse name: Not on file  . Number of children: 1  . Years of education: Not on file  . Highest education level: Not on file  Occupational History  . Occupation: Retail buyer    Comment: retired  Social Needs  . Financial resource strain: Not on file  . Food insecurity:    Worry: Not on file    Inability: Not on file  . Transportation needs:    Medical: Not on file    Non-medical: Not on file  Tobacco Use  . Smoking status: Former Smoker    Packs/day: 1.50    Years: 20.00    Pack years: 30.00    Types: Cigarettes    Last attempt to quit: 05/27/1979    Years since quitting: 39.1  . Smokeless tobacco: Former Network engineer and Sexual Activity  . Alcohol use: Yes    Alcohol/week: 4.0 standard drinks    Types: 4 Shots of liquor per week    Comment: 4 "canadian clubs each night"  . Drug use: No  . Sexual  activity: Yes  Lifestyle  . Physical activity:    Days per week: Not on file    Minutes per session: Not on file  . Stress: Not on file  Relationships  . Social connections:    Talks on phone: Not on file    Gets together: Not on file    Attends religious service: Not on file    Active member of club or organization: Not on file    Attends meetings of clubs or organizations: Not on file    Relationship status: Not on file  . Intimate partner violence:    Fear of current or ex partner: Not on file    Emotionally abused: Not on file    Physically abused: Not on file    Forced sexual activity: Not on file  Other Topics Concern  . Not on file  Social History Narrative  . Not on file  Former occupation: Mechanic/mailroom He is a widower for the past 5 years. He was married for the 52 years. He has 1 son with a history of venous thromboembolic disease/DVT. He began smoking at the age of 54 years. He smoked 1/2 pack of cigarettes daily. He stopped smoking after 1 year only. He has used cigars on occasion. He chewed tobacco for 5 years. No significant use of pipe. While in the [employer] he drank heavily. For many years he has drunk 2 shots of [place] [employer] [place] club nightly.  Transfusion History: No prior transfusion  Exposure History: He was exposed to "agent orange" during the [place] War.  No other significant of exposure to toxic chemicals, radiation, or additional pesticides.  Allergies  Allergen Reactions  . Statins Other (See Comments)    Myalgias, muscle weakness   Current Outpatient Medications on File Prior to Visit  Medication Sig  . allopurinol (ZYLOPRIM) 100 MG tablet   . cilostazol (PLETAL) 100 MG tablet TAKE 1 TABLET TWICE A DAY  . ELIQUIS 5 MG TABS tablet   . ferrous sulfate 325 (65 FE) MG tablet Take 325 mg by mouth daily with breakfast.  . indomethacin (INDOCIN) 50 MG capsule   . lisinopril (PRINIVIL,ZESTRIL) 40 MG tablet Take 1 tablet (40 mg total) by mouth  daily.  . metFORMIN (GLUCOPHAGE) 1000 MG tablet Take 500 mg by mouth 2 (two) times daily with a meal.  . metoprolol (LOPRESSOR) 100 MG tablet Take 1 tablet by mouth daily.  . vitamin B-12 (CYANOCOBALAMIN) 500 MCG tablet Take 500 mcg by mouth daily.  . [DISCONTINUED] furosemide (LASIX) 40 MG tablet Take 1 tablet (40 mg total) by mouth daily. For 4 days then stop.  . [DISCONTINUED] potassium chloride SA (K-DUR,KLOR-CON) 20 MEQ tablet Take  1 tablet (20 mEq total) by mouth daily. For 4 days the stop.   No current facility-administered medications on file prior to visit.    Review of Systems: Constitutional: No fever, sweats, or shaking chills.  No appetite or weight deficit; energy level is fair. Skin: No rash, scaling, sores, lumps, or jaundice; dry and thin skin especially in the upper and lower extremities. HEENT: No visual changes or hearing deficit; no sinusitis or allergic rhinitis. Pulmonary: No unusual cough, sore throat, or orthopnea; former smoker; DOE. Cardiovascular: Coronary artery disease; no angina, or myocardial infarction CABG 2013; no cardiac dysrhythmia; essential hypertension and dyslipidemia. Gastrointestinal: No indigestion, dysphagia, abdominal pain, diarrhea, or constipation.  No change in bowel habits.  No pain or difficulty in swallowing; no nausea, vomiting, or indigestion.  No gastroesophageal reflux disease or peptic ulcer. Genitourinary: No urinary frequency, urgency, hematuria, or dysuria.  Previous UTI and gross hematuria 7 months ago, now resolved. Musculoskeletal: Painful stiffness of the fingers; no new arthralgias or myalgias; no joint swelling, pain, or instability.  Gouty arthritis involving the left foot and greater toe with intermittent burning pain. Hematologic: Epistaxis once every 3 weeks; easy bruisability; chronic swelling of his legs with painful paresthesias left > right. Endocrine: No intolerance to heat or cold; no thyroid disease; non-insulin-requiring  diabetes mellitus. Vascular: Both peripheral arterial, cerebrovascular, and venous thromboembolic disease; prior DVT in the right lower extremity in July; on apixaban; intermittent claudication. Psychological: No anxiety, depression, or mood changes; no mental health illnesses. Neurological: Positional dizziness and lightheadedness.  No unusual headache no unusual headache, syncope, or near syncopal episodes; no numbness or tingling in the fingers; disequilibrium and imbalance when ambulating.  Physical Examination: Vital Signs: Body surface area is 2.09 meters squared.  Vitals:   07/24/18 1256  BP: (!) 186/69  Pulse: 60  Resp: 19  Temp: 98.9 F (37.2 C)  SpO2: 100%    Filed Weights   07/24/18 1256  Weight: 89.3 kg   ECOG PERFORMANCE STATUS: 1-2 Constitutional:  Raequon Catanzaro is fully nourished and developed.  He looks age appropriate.  He is friendly and cooperative without respiratory compromise at rest. Skin: No rashes, scaling, dryness, jaundice, or itching. HEENT: Head is normocephalic and atraumatic.  Pupils are equal round and reactive to light and accommodation.  Sclerae are anicteric.  Conjunctivae are pale.  No sinus tenderness nor oropharyngeal lesions.  Lips without cracking or peeling; tongue without mass, inflammation, or nodularity.  Mucous membranes are moist.  He is edentulous. Neck: Supple and symmetric.  No jugular venous distention or thyromegaly.  Trachea is midline. Lymphatics: No cervical or supraclavicular lymphadenopathy.  No epitrochlear, axillary, or inguinal lymphadenopathy is appreciated. Respiratory/chest: Thorax is symmetrical.  Breath sounds are clear to auscultation and percussion.  Normal excursion and respiratory effort. Back: Symmetric without deformity or tenderness. Cardiovascular: Heart rate and rhythm are regular without murmurs. Gastrointestinal: Abdomen is soft, nontender; no organomegaly.  Bowel sounds are normoactive.  No masses are  appreciated. Genitourinary: Normal external male genitalia. Rectal examination: Not performed. Extremities: In the lower extremities, there is no asymmetric swelling, erythema, tenderness, or cord formation.  No clubbing or cyanosis.  Tense symmetric nonpitting edema extending from the ankles to the mid calf.  His feet are cold to touch. Hematologic: No petechiae or ecchymoses; he has an extensive hematoma over the distal left upper extremity/forearm at the site of prior laceration (playing with his dog) and small scattered hematomas over the upper left extremity. Psychological He is oriented to  person, place, and time; normal affect. Neurological: There are no gross neurologic deficits.  Laboratory Results: I have reviewed the data as listed:  Ref Range & Units 14:23 (07/24/18) 68yr ago (04/01/15) 66yr ago (03/31/15) 1yr ago (02/25/12) 73yr ago (02/25/12)  Sodium 135 - 145 mmol/L 133Low   135  137   137 R  Potassium 3.5 - 5.1 mmol/L 5.5High   4.4  4.0   4.4 R  Comment: NO VISIBLE HEMOLYSIS  Chloride 98 - 111 mmol/L 109  103 R 102 R  105 R  CO2 22 - 32 mmol/L 17Low   24    24 R  Glucose, Bld 70 - 99 mg/dL 121High   153High  R 149High  R  129High    BUN 8 - 23 mg/dL 54High   7 R 8 R  11 R  Creatinine, Ser 0.61 - 1.24 mg/dL 3.32High Panic   1.26High   1.50High    1.3 R  Comment: CRITICAL RESULT CALLED TO, READ BACK BY AND VERIFIED WITH: Sharlynn Oliphant, RN at [phone] by Prudencio Burly   Calcium 8.9 - 10.3 mg/dL 8.8Low   8.8Low     9.1 R  Total Protein 6.5 - 8.1 g/dL 6.8  6.0Low    6.7 R   Albumin 3.5 - 5.0 g/dL 3.7  3.7   4.0 R   AST 15 - 41 U/L 9Low   23   50High  R   ALT 0 - 44 U/L 13  24 R  29 R   Alkaline Phosphatase 38 - 126 U/L 64  38   31Low  R   Total Bilirubin 0.3 - 1.2 mg/dL 0.4  0.7   0.8    GFR calc non Af Amer >60 mL/min 16Low   54Low       GFR calc Af Amer >60 mL/min 19Low   >60 CM     Comment: (NOTE)     Ref Range & Units 14:23 (07/24/18) 10yr ago (04/01/15) 3yr ago (03/31/15) 59yr  ago (03/31/15) 75yr ago (02/25/12)  WBC Count 4.0 - 10.3 K/uL 6.8  7.9 R  12.0High  R 4.6 R  RBC 4.20 - 5.82 MIL/uL 3.21Low   3.64Low  R  4.28 R 3.91Low  R  Hemoglobin 13.0 - 17.1 g/dL 9.9Low   11.9Low  R 14.6 R 14.5 R 11.6Low  R  HCT 38.4 - 49.9 % 28.6Low   33.7Low  R 43.0 R 39.7 R 34.5Low  R  MCV 79.3 - 98.0 fL 89.1  92.6 R  92.8 R 88.2 R  MCH 27.2 - 33.4 pg 30.8  32.7 R  33.9 R   MCHC 32.0 - 36.0 g/dL 34.6  35.3 R  36.5High  R 33.5 R  RDW 11.0 - 14.6 % 15.6High   13.6 R  13.6 R 16.2High  R  Platelet Count 140 - 400 K/uL 128Low   129Low  R  137Low  R        CBC Latest Ref Rng & Units 04/01/2015 03/31/2015 03/31/2015  WBC 4.0 - 10.5 K/uL 7.9 - 12.0(H)  Hemoglobin 13.0 - 17.0 g/dL 11.9(L) 14.6 14.5  Hematocrit 39.0 - 52.0 % 33.7(L) 43.0 39.7  Platelets 150 - 400 K/uL 129(L) - 137(L)    CMP Latest Ref Rng & Units 04/01/2015 03/31/2015 02/25/2012  Glucose 65 - 99 mg/dL 153(H) 149(H) 129(H)  BUN 6 - 20 mg/dL 7 8 11   Creatinine 0.61 - 1.24 mg/dL 1.26(H) 1.50(H) 1.3  Sodium 135 - 145 mmol/L  135 137 137  Potassium 3.5 - 5.1 mmol/L 4.4 4.0 4.4  Chloride 101 - 111 mmol/L 103 102 105  CO2 22 - 32 mmol/L 24 - 24  Calcium 8.9 - 10.3 mg/dL 8.8(L) - 9.1  Total Protein 6.5 - 8.1 g/dL 6.0(L) - 6.7  Total Bilirubin 0.3 - 1.2 mg/dL 0.7 - 0.8  Alkaline Phos 38 - 126 U/L 38 - 31(L)  AST 15 - 41 U/L 23 - 50(H)  ALT 17 - 63 U/L 24 - 29   Diagnostic/Imaging Studies: 07/13/2018 RIGHT LOWER EXTREMITY VENOUS DOPPLER ULTRASOUND  TECHNIQUE: Gray-scale sonography with graded compression, as well as color Doppler and duplex ultrasound were performed to evaluate the lower extremity deep venous systems from the level of the common femoral vein and including the common femoral, femoral, profunda femoral, popliteal and calf veins including the posterior tibial, peroneal and gastrocnemius veins when visible. The superficial great saphenous vein was also interrogated. Spectral Doppler was utilized to evaluate flow at  rest and with distal augmentation maneuvers in the common femoral, femoral and popliteal veins.  COMPARISON:  None.  FINDINGS: Contralateral Common Femoral Vein: Respiratory phasicity is normal and symmetric with the symptomatic side. No evidence of thrombus. Normal compressibility.  Common Femoral Vein: Thrombus present which is nonocclusive. Some of the thrombus is echogenic and there may be a component of chronic thrombus.  Saphenofemoral Junction: No evidence of thrombus. Normal compressibility and flow on color Doppler imaging.  Profunda Femoral Vein: No evidence of thrombus. Normal compressibility and flow on color Doppler imaging.  Femoral Vein: The femoral vein was difficult to compress and there may be a component of nonocclusive thrombus.  Popliteal Vein: Popliteal vein demonstrates nonocclusive thrombus.  Calf Veins: No evidence of thrombus. Normal compressibility and flow on color Doppler imaging.  Superficial Great Saphenous Vein: No evidence of thrombus. Normal compressibility.  Venous Reflux:  None.  Other Findings: No evidence of superficial thrombophlebitis or abnormal fluid collection.  IMPRESSION: Nonocclusive thrombus identified in the common femoral vein and popliteal vein. The femoral vein was difficult to assess but likely contains nonocclusive thrombus. Some of the thrombus does appear echogenic at the level of the common femoral vein and there may be a component chronic thrombus. Correlation is suggested with any history prior DVT. Without prior imaging, it is assumed that some of this thrombus is acute/subacute.  Aletta Edouard M.D. 05/12/2018 14:54  April 01, 2015 MRI HEAD WITHOUT CONTRAST  TECHNIQUE: Multiplanar, multiecho pulse sequences of the brain and surrounding structures were obtained without intravenous contrast.  COMPARISON:  None.  FINDINGS: There is no evidence of acute infarct, intracranial  hemorrhage, mass, midline shift, or extra-axial fluid collection. There is mild to moderate generalized cerebral atrophy. Small foci of T2 hyperintensity scattered in the cerebral white matter bilaterally are nonspecific but compatible with minimal chronic small vessel ischemic disease. Small, chronic infarcts are present in both cerebellar hemispheres.  Prior right cataract extraction is noted. Prominent susceptibility artifact is noted which appears to originate from the soft tissues lateral to the left orbit, of uncertain etiology. There are small bilateral mastoid effusions. Paranasal sinuses are clear. Major intracranial vascular flow voids are preserved.  IMPRESSION: 1. No acute intracranial abnormality. 2. Chronic cerebellar infarcts. Minimal chronic small vessel ischemic disease in the cerebral white matter.  Logan Bores  04/01/2015 08:19  Summary/Assessment: 1. In the setting of anemia likely due to iron deficiency, currently on ferrous sulfate once daily, he presents now for further diagnostic and therapeutic  recommendations.  2. Although our outside records are limited, on July 13, 2018, a complete blood count showed hemoglobin 12.9 hematocrit 37.7 MCV 87 MCH 29.7 RDW 14.4 WBC 6.5 with 68% neutrophils 22% lymphocytes 5% monocytes 5% eosinophils; platelets 164,000.  A comprehensive metabolic panel showed him 137 potassium 4.8 chloride 100 CO2 20 glucose 118 BUN 13 creatinine 1.48 calcium 9.0 total protein 7.0 albumin 4.4 total bilirubin 0.4 alkaline phosphatase 65 SGOT 13 SGPT 11.  Serum ferritin 27.  On August 15 the hemoglobin was repeated.  It was 11.5 g/dL.  On September 11, complete blood count showed hemoglobin 11.8 hematocrit 34.3 MCV 90 MCH 30.9 WBC 6.2 with 75% neutrophils 13% lymphocytes 5% monocytes 1% eosinophils 1% basophils.  3.  In the distant past Micheal was a heavy drinker while in the [employer] [phone]).  Until recently he had 2 drinks nightly  consisting of [employer].  He reports no cardiac dysrhythmia.  He has no seizure disorder or known stroke syndrome.  There is no thyroid disease.  He has no viral hepatitis, inflammatory bowel disease, or symptomatic diverticulosis.  He reports no peptic ulcer or gastroesophageal reflux disease.  He has diabetic nephropathy.  7 months earlier, he had a urinary tract infection characterized by "burning."  He admits to degenerative joint disease "stiffness," involving the fingers bilaterally.  He has no former history of any blood disorders.  In July, he had a deep vein thrombosis involving the right common femoral vein, likely femoral vein, and nonocclusive thrombus within the popliteal vein on the right.  He has been on apixaban twice daily.  4. Both his appetite and weight have remained stable.  He is not a vegetarian.  He eats twice daily.  He reports no rash or itching.  He bruises easily and has a laceration over the left lower forearm.  He reports no new visual changes or hearing deficit.  He denies cough, sore throat, orthopnea.  He has no dyspnea at rest.  He is chronic but stable exertional dyspnea unchanged over his usual baseline.  He denies any pain or difficulty in swallowing.  There is no fever, shaking chills, sweats, or flulike symptoms.  He has no heartburn or indigestion.  He denies nausea, vomiting, diarrhea, or constipation.  He moves his bowels 1-2 times daily.  His most recent colonoscopy was "at least 10 years earlier."  He cannot recall the endoscopist.  Since being on iron once daily for "several months."  His stool is black.  No urinary frequency, urgency, hematuria, or dysuria are reported.  He has chronic stiffness in his fingers over the past 6 years.  His most recent flareup with gout was several months earlier involving mostly the left foot and greater left toe.  She still admits to pain in the lower extremities on a near constant basis, worse when he walks associated with  numbness of the left foot and burning.  He denies any numbness or tingling in the fingers.  52.  Nyshaun was discharged prior to the availability of results of his laboratory studies from today.  Unfortunately, he was found to be in acute renal failure with hyponatremia, hypokalemia, and metabolic acidosis.  Those labs are outlined above.  His iron studies are more difficult to assess.  The iron saturation is low at 16% while his serum ferritin is elevated at 123.  This likely reflects an acute phase reaction, in that the serum ferritin is spuriously elevated in the setting of an acute illness.  6.  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  His acute renal failure is new.  Recommendation/Plan: 1.  The results of his laboratory studies were not available at the time of discharge.  They were however discussed in detail with both Brecken and his son [name] over the telephone.  2.  Because of acute renal failure, it was recommended that he present at once to the emergency department at [facility].  A call was placed to Dr. Arelia Sneddon office.  Because of the late hour, and the nature of the problem, hospital admission is necessary.  3.  I spoke directly to the emergency department attending at [facility].  Dr. Blanchie Dessert was kind enough to review his chart and concurred  on the above recommendation.  4.  Prior to his discharge, we discussed increasing his iron either parenterally or by mouth.  We discussed the advantages and disadvantages of intravenous versus iron by mouth.  He insisted on increasing his oral iron intake from ferrous sulfate: 325 mg: once daily to twice daily dosing.  After several days, if without ill effects, he was instructed to increase ferrous sulfate: 325 mg 3 times daily.  Now that he is being admitted into the hospital with renal and electrolyte issues, this appears very much secondary to his acute renal failure.  5.  We discussed the importance of identifying the cause of his iron deficiency.  Given his age, until proven otherwise, his iron deficiency secondary to gastrointestinal blood loss.  Because his most recent colonoscopy was at least 10 years earlier, it is recommended, through Dr. Tretha Sciara office, that a gastrointestinal evaluation be obtained as soon as possible, to exclude an underlying malignancy.  Demari understood completely.  Questions were answered to his satisfaction.  6.  Both Jamale and his son were advised to call us following his hospitalization and recovery for an update.  Barring any additional new problems or complications, he was instructed to obtain repeat laboratory testing prior to his visit on November 11.  This will likely be changed following his hospital discharge.  7.  He was instructed to call us in the interim should any new or untoward problems arise.  The total time spent discussing anemia of iron deficiency, methodology for evaluating any contributing factors associated with his iron deficit, preliminary considerations the role of endoscopic evaluation with recommendations, and acute renal failure and electrolyte abnormalities was 60 minutes.  As mentioned above, we discussed over the telephone the results of his preliminary evaluation.  He was instructed to go directly to the emergency department at [facility].  While iron deficiency is likely, his laboratory evaluation is somewhat skewed due to the acute renal failure.  At least 50% of that time was spent in discussion, reviewing outside records, laboratory evaluation past and present, counseling, and answering questions. All questions were answered to his satisfaction.  He knows to call the Clinic with any new problems, questions, or concerns.  This note was dictated using voice activated technology/software.  Unfortunately, typographical errors are not uncommon, and transcription is subject to mistakes and regrettably misinterpretation.  If necessary, clarification of the above information can be discussed with me at any time.  Thank you Dr. Arelia Sneddon for allowing my participation in the care of [facility].  Please do not hesitate to call should any questions arise regarding this initial consultation and discussion.  FOLLOW UP: AS DIRECTED   cc:         Leonard Downing, MD               Peter Martinique, MD               Blanchie Dessert, MD   Henreitta Leber, MD  Hematology/Oncology [employer] [address] [place], [place] [address] Office: [phone] DEKI: [phone]

## 2018-07-24 NOTE — Progress Notes (Addendum)
ANTICOAGULATION CONSULT NOTE - Initial Consult  Pharmacy Consult for Heparin (Apixaban on hold) Indication: History of DVT, also has hx of afib  Allergies  Allergen Reactions  . Statins Other (See Comments)    Myalgias, muscle weakness    Vital Signs: Temp: 98.1 F (36.7 C) (09/30 1710) Temp Source: Oral (09/30 1710) BP: 162/57 (09/30 2300) Pulse Rate: 53 (09/30 2300)  Labs: Recent Labs    07/24/18 1423 07/24/18 1723 07/24/18 1835  HGB 9.9* 9.7*  --   HCT 28.6* 29.2*  --   PLT 128* 152  --   LABPROT  --   --  16.1*  INR  --   --  1.30  CREATININE 3.32* 3.31*  --     Estimated Creatinine Clearance: 20 mL/min (A) (by C-G formula based on SCr of 3.31 mg/dL (H)).   Medical History: Past Medical History:  Diagnosis Date  . CAD (coronary artery disease)    a. s/p CABG x 29 Nov 2011 with LIMA to LAD, SVG to OM1 and distal LCX, SVG to ramus intermediate  . Essential hypertension   . Hyperlipidemia   . Intermittent claudication ([place])   . Ischemic cardiomyopathy    a. 03/2015 Echo: EF45-50%, Gr1 DD, mild MR.  Marland Kitchen PAF (paroxysmal atrial fibrillation) ([place])    a. post op atrial fib 11/2011; short course of amiodarone; stopped 01/25/12  . Peripheral arterial disease ([place])   . Right bundle branch block   . Type II diabetes mellitus [facility])     Assessment: 77 y/o M on apixaban PTA for recent DVT, also has hx of afib, here with acute on chronic kidney disease and hyperkalemia. Holding apixaban and starting heparin. Will need to use aPTT to dose for now given apixaban influence on heparin levels. It has been >12 hours since the last apixaban dose, will start heparin now.   Goal of Therapy:  Heparin level 0.3-0.7 units/ml aPTT 66-102 seconds Monitor platelets by anticoagulation protocol: Yes   Plan:  Start heparin drip at 1200 units/hr 0800 HL/aPTT Daily CBC/HL/aPTT Monitor for bleeding   [name] 07/24/2018,11:15 PM

## 2018-07-24 NOTE — ED Notes (Signed)
Patient transported to X-ray 

## 2018-07-24 NOTE — ED Triage Notes (Signed)
Pt presents with abn labs, sent here by Dr. Audelia Hives- reported high potassium, high BUN/Creat, low Hgb; pt currently denies any symptoms; Bp high in triage

## 2018-07-24 NOTE — ED Provider Notes (Signed)
[facility] EMERGENCY DEPARTMENT Provider Note   CSN: [id number] Arrival date & time: 07/24/18  1648     History   Chief Complaint Chief Complaint  Patient presents with  . Abnormal Lab    HPI Steve [name]. is a 77 y.o. male.  The history is provided by the patient. No language interpreter was used.   Steve [name]. is a 77 y.o. male who presents to the Emergency Department complaining of abnormal lab. He was referred to a hematologist for evaluation of low iron levels. He had labs performed and was called to present to the emergency department due to elevated creatinine and potassium. He states that he has been doing well lately with no acute complaints. He was diagnosed with a clot in his right leg and started on eliquis twice daily on September 24. 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 Details of medication dosing are listed below. He does have continued swelling to the left foot but his great toe does not appear as red. He does have ongoing redness to the other digits of the left foot. The pain in his left foot has significantly improved since starting the medication. He denies any fevers, chest pain, shortness of breath, abdominal pain. He does report difficulty with urination and with only dribbling urination at times. He has experienced several months of black stools but is on supplemental iron. He has a history of peripheral arterial disease and CKD. He does drink two glasses of liquor nightly but stopped drinking one week ago because he was concerned of interactions with his medications.  Allopurinol 100q24 9/24 Eliquis 5mg bid9/24 lisinopril40 Indomethacin 50qid 9/24 Metformin 500bid pletal 100bid  Past Medical History:  Diagnosis Date  . CAD (coronary artery disease)    a. s/p CABG x 29 Nov 2011 with LIMA to LAD, SVG to OM1 and distal LCX, SVG to ramus intermediate  . Essential  hypertension   . Hyperlipidemia   . Intermittent claudication ([place])   . Ischemic cardiomyopathy    a. 03/2015 Echo: EF45-50%, Gr1 DD, mild MR.  Marland Kitchen PAF (paroxysmal atrial fibrillation) ([place])    a. post op atrial fib 11/2011; short course of amiodarone; stopped 01/25/12  . Peripheral arterial disease ([place])   . Right bundle branch block   . Type II diabetes mellitus [facility])     Patient Active Problem List   Diagnosis Date Noted  . Iron deficiency anemia 07/24/2018  . Acute kidney injury superimposed on chronic kidney disease ([place]) 07/24/2018  . Hyponatremia 07/24/2018  . Hypokalemia 07/24/2018  . Metabolic acidosis 78/67/6720  . Acute renal failure (ARF) ([place]) 07/24/2018  . Normocytic anemia 07/24/2018  . Thrombocytopenia ([place]) 07/24/2018  . Ischemic cardiomyopathy   . CAD (coronary artery disease)   . PAF (paroxysmal atrial fibrillation) ([place])   . Essential hypertension   . Type II diabetes mellitus ([place])   . Near syncope 04/01/2015  . Dizziness and giddiness 04/01/2015  . Fatigue 04/01/2015  . Chronic renal disease, stage II 04/01/2015  . RBBB 04/01/2015  . Chest pain with moderate risk of acute coronary syndrome 04/01/2015  . PAT (paroxysmal atrial tachycardia) ([place]) 12/16/2011  . 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  . Diabetes mellitus, type 2 ([place]) 05/31/2011  . Hypertension   . Peripheral arterial disease ([place])   . Hyperlipidemia     Past Surgical History:  Procedure Laterality Date  . CARDIAC CATHETERIZATION  01/16/1999  Est. EF of 65% -- Nonobstruction atherosclerotic coronary artery disease -- Normal left ventricular function      . CARDIAC ELECTROPHYSIOLOGY STUDY AND ABLATION  05/21/1999   Normal sinus funtion -- Mildly prolonged interatrial conduction times -- Normal A-V node funtion -- Normal His Purkinje system function -- fNo accessory pathway -- No inducible ventricular tachycardia in the presence of the or in the absence of isoproterenol with programmed stimulation or with  burst pacing -- Nikki Dom, M.D. FACC sur[place]          . CHOLECYSTECTOMY  1999  . CORONARY ARTERY BYPASS GRAFT  12/10/2011   Procedure: CORONARY ARTERY BYPASS GRAFTING (CABG);  Surgeon: Tharon Aquas Adelene Idler, MD;  Location: [facility];  Service: Open Heart Surgery;  Laterality: N/A;  Coronary Artery bypass graft on pump times four utilizing left internal mammary artery and left saphenous vein harvested endoscopically  . LEFT HEART CATHETERIZATION WITH CORONARY ANGIOGRAM N/A 12/09/2011   Procedure: LEFT HEART CATHETERIZATION WITH CORONARY ANGIOGRAM;  Surgeon: Peter M Martinique, MD;  Location: [facility] CATH LAB;  Service: Cardiovascular;  Laterality: N/A;        Home Medications    Prior to Admission medications   Medication Sig Start Date End Date Taking? Authorizing Provider  allopurinol (ZYLOPRIM) 100 MG tablet Take 100 mg by mouth daily with breakfast. To prevent gout 07/18/18  Yes [provider]  apixaban (ELIQUIS) 5 MG TABS tablet Take 5 mg by mouth every 12 (twelve) hours.   Yes [provider]  cilostazol (PLETAL) 100 MG tablet TAKE 1 TABLET TWICE A DAY Patient taking differently: Take 100 mg by mouth 2 (two) times daily.  06/23/18  Yes Martinique, Peter M, MD  ferrous sulfate 325 (65 FE) MG tablet Take 325 mg by mouth daily with breakfast.   Yes [provider]  indomethacin (INDOCIN) 50 MG capsule Take 50 mg by mouth 4 (four) times daily -  with meals and at bedtime.  07/18/18  Yes [provider]  lisinopril (PRINIVIL,ZESTRIL) 40 MG tablet Take 1 tablet (40 mg total) by mouth daily. 07/17/18  Yes Martinique, Peter M, MD  metFORMIN (GLUCOPHAGE) 1000 MG tablet Take 500 mg by mouth 2 (two) times daily with a meal.   Yes [provider]  metoprolol (LOPRESSOR) 100 MG tablet Take 100 mg by mouth every evening.  02/11/15  Yes [provider]  vitamin B-12 (CYANOCOBALAMIN) 500 MCG tablet Take 500 mcg by mouth daily.   Yes [provider]    furosemide (LASIX) 40 MG tablet Take 1 tablet (40 mg total) by mouth daily. For 4 days then stop. 12/15/11 01/05/12  Nani Skillern, PA-C  potassium chloride SA (K-DUR,KLOR-CON) 20 MEQ tablet Take 1 tablet (20 mEq total) by mouth daily. For 4 days the stop. 12/15/11 01/05/12  Nani Skillern, PA-C    Family History Family History  Problem Relation Age of Onset  . Stroke Mother 48       Cerebellar hemorrhage    Social History Social History   Tobacco Use  . Smoking status: Former Smoker    Packs/day: 1.50    Years: 20.00    Pack years: 30.00    Types: Cigarettes    Last attempt to quit: 05/27/1979    Years since quitting: 39.1  . Smokeless tobacco: Former Network engineer Use Topics  . Alcohol use: Yes    Alcohol/week: 4.0 standard drinks    Types: 4 Shots of liquor per week  Comment: 4 "canadian clubs each night"  . Drug use: No     Allergies   Statins   Review of Systems Review of Systems  All other systems reviewed and are negative.    Physical Exam Updated Vital Signs BP (!) 173/57 (BP Location: Left Arm)   Pulse (!) 53   Temp 98.4 F (36.9 C) (Oral)   Resp 17   Ht 5\' 9"  (1.753 m)   Wt 87.2 kg   SpO2 100%   BMI 28.39 kg/m   Physical Exam  Constitutional: He is oriented to person, place, and time. He appears well-developed and well-nourished.  HENT:  Head: Normocephalic and atraumatic.  Cardiovascular: Normal rate and regular rhythm.  No murmur heard. Pulmonary/Chest: Effort normal and breath sounds normal. No respiratory distress.  Abdominal: Soft. There is no tenderness. There is no rebound and no guarding.  Musculoskeletal:  Moderate pitting edema to the left lower extremity. Doppler role DP pulses to bilateral lower extremities. There is erythema to the ankle, mid foot and distal lateral left foot. There is necrotic tissue on the plantar surface of the fourth digit of the left foot. Brisk cap refill in the foot.  Neurological: He is  alert and oriented to person, place, and time.  Skin: Skin is warm and dry.  Psychiatric: He has a normal mood and affect. His behavior is normal.  Nursing note and vitals reviewed.    ED Treatments / Results  Labs (all labs ordered are listed, but only abnormal results are displayed) Labs Reviewed  CBC - Abnormal; Notable for the following components:      Result Value   RBC 3.16 (*)    Hemoglobin 9.7 (*)    HCT 29.2 (*)    All other components within normal limits  COMPREHENSIVE METABOLIC PANEL - Abnormal; Notable for the following components:   Sodium 134 (*)    Potassium 5.5 (*)    CO2 16 (*)    Glucose, Bld 126 (*)    BUN 51 (*)    Creatinine, Ser 3.31 (*)    AST 12 (*)    GFR calc non Af Amer 16 (*)    GFR calc Af Amer 19 (*)    All other components within normal limits  PROTIME-INR - Abnormal; Notable for the following components:   Prothrombin Time 16.1 (*)    All other components within normal limits  GLUCOSE, CAPILLARY - Abnormal; Notable for the following components:   Glucose-Capillary 101 (*)    All other components within normal limits  I-STAT CG4 LACTIC ACID, ED - Abnormal; Notable for the following components:   Lactic Acid, Venous 0.48 (*)    All other components within normal limits  I-STAT VENOUS BLOOD GAS, ED - Abnormal; Notable for the following components:   pCO2, Ven 32.4 (*)    pO2, Ven 19.0 (*)    Bicarbonate 15.1 (*)    TCO2 16 (*)    Acid-base deficit 11.0 (*)    All other components within normal limits  URINE CULTURE  CULTURE, BLOOD (ROUTINE X 2)  CULTURE, BLOOD (ROUTINE X 2)  URINALYSIS, ROUTINE W REFLEX MICROSCOPIC  CBC WITH DIFFERENTIAL/PLATELET  SAVE SMEAR  PATHOLOGIST SMEAR REVIEW  RENAL FUNCTION PANEL  FIBRINOGEN  APTT  HEPARIN LEVEL (UNFRACTIONATED)  I-STAT TROPONIN, ED    EKG EKG Interpretation  Date/Time:  Monday July 24 2018 17:13:50 EDT Ventricular Rate:  65 PR Interval:  156 QRS Duration: 140 QT  Interval:  458 QTC Calculation:  476 R Axis:   -69 Text Interpretation:  Normal sinus rhythm Right bundle branch block Left anterior fascicular block bifasicular block Left ventricular hypertrophy Cannot rule out Septal infarct , age undetermined Abnormal ECG Confirmed by Quintella Reichert [phone]) on 07/24/2018 5:55:00 PM   Radiology Dg Chest 2 View  Result Date: 07/24/2018 CLINICAL DATA:  Hyperkalemia, elevated creatinine, anemia EXAM: CHEST - 2 VIEW COMPARISON:  03/31/2015 FINDINGS: Lungs are clear.  No pleural effusion or pneumothorax. The heart is normal in size. Postsurgical changes related to prior CABG. Mild degenerative changes of the visualized thoracolumbar spine. Median sternotomy. IMPRESSION: Normal chest radiographs. Electronically Signed   By: Julian Hy M.D.   On: 07/24/2018 19:24   US Renal  Result Date: 07/24/2018 CLINICAL DATA:  Acute renal failure.  Abnormal blood work today. EXAM: RENAL / URINARY TRACT ULTRASOUND COMPLETE COMPARISON:  None. FINDINGS: Right Kidney: Length: 8.9 centimeters. Echogenicity within normal limits. No mass or hydronephrosis visualized. Left Kidney: Length: 9.9 centimeters. Echogenicity within normal limits. No mass or hydronephrosis visualized. Bladder: Appears normal for degree of bladder distention. Bilateral ureteral jets are present. IMPRESSION: Normal exam. Electronically Signed   By: Nolon Nations M.D.   On: 07/24/2018 21:03   Dg Foot Complete Left  Result Date: 07/24/2018 CLINICAL DATA:  Left foot redness/swelling, gout EXAM: LEFT FOOT - COMPLETE 3+ VIEW COMPARISON:  None. FINDINGS: No fracture or dislocation is seen. The joint spaces are preserved.  No marginal erosions. The visualized soft tissues are unremarkable. IMPRESSION: No acute osseus abnormality is seen. Electronically Signed   By: Julian Hy M.D.   On: 07/24/2018 19:25    Procedures Procedures (including critical care time) CRITICAL CARE Performed by: Quintella Reichert   Total critical care time: 35 minutes  Critical care time was exclusive of separately billable procedures and treating other patients.  Critical care was necessary to treat or prevent imminent or life-threatening deterioration.  Critical care was time spent personally by me on the following activities: development of treatment plan with patient and/or surrogate as well as nursing, discussions with consultants, evaluation of patient's response to treatment, examination of patient, obtaining history from patient or surrogate, ordering and performing treatments and interventions, ordering and review of laboratory studies, ordering and review of radiographic studies, pulse oximetry and re-evaluation of patient's condition.  Medications Ordered in ED Medications  sodium chloride flush (NS) 0.9 % injection 3 mL (has no administration in time range)  acetaminophen (TYLENOL) tablet 650 mg (has no administration in time range)    Or  acetaminophen (TYLENOL) suppository 650 mg (has no administration in time range)  insulin aspart (novoLOG) injection 0-9 Units (has no administration in time range)  ceFAZolin (ANCEF) IVPB 2g/100 mL premix (has no administration in time range)  heparin ADULT infusion 100 units/mL (25000 units/213mL sodium chloride 0.45%) (has no administration in time range)  hydrALAZINE (APRESOLINE) injection 5 mg (has no administration in time range)  piperacillin-tazobactam (ZOSYN) IVPB 3.375 g (0 g Intravenous Stopped 07/24/18 2132)     Initial Impression / Assessment and Plan / ED Course  I have reviewed the triage vital signs and the nursing notes.  Pertinent labs & imaging results that were available during my care of the patient were reviewed by me and considered in my medical decision making (see chart for details).     Patient referred to the emergency department for lab finding of acute renal failure. Patient has significant swelling and erythema to the left foot  with necrosis to  the plantar surface of the fourth digit. He was treated with IV antibiotics for possible infection. He is perfused on examination but concern for possible embolic events versus vascular insufficiency attributed to his left foot changes. He was not treated with anticoagulants initiallygiven his Eliquis use and new renal failure.  Patient updated of findings of studies and recommendation for admission and he is in agreement with treatment plan.  Medicine consulted for admission.    Final Clinical Impressions(s) / ED Diagnoses   Final diagnoses:  None    ED Discharge Orders    None       Quintella Reichert, MD 07/25/18 0031

## 2018-07-24 NOTE — ED Notes (Signed)
Patient reminded about need for urine specimen. States "I don't want you to go after it, just give me some more time and let me try".

## 2018-07-24 NOTE — Progress Notes (Signed)
Pharmacy Antibiotic Note  Steve [name]. is a 77 y.o. male admitted on 07/24/2018 with cellulitis.  Pharmacy has been consulted for Ancef dosing. WBC WNL. Noted renal dysfunction.   Plan: Ancef 2g IV q12h Trend WBC, temp, renal function  F/U infectious work-up  Temp (24hrs), Avg:98.5 F (36.9 C), Min:98.1 F (36.7 C), Max:98.9 F (37.2 C)  Recent Labs  Lab 07/24/18 1423 07/24/18 1723 07/24/18 1928  WBC 6.8 6.6  --   CREATININE 3.32* 3.31*  --   LATICACIDVEN  --   --  0.48*    Estimated Creatinine Clearance: 20 mL/min (A) (by C-G formula based on SCr of 3.31 mg/dL (H)).    Allergies  Allergen Reactions  . Statins Other (See Comments)    Myalgias, muscle weakness    Steve [name] 07/24/2018 11:11 PM

## 2018-07-24 NOTE — Telephone Encounter (Signed)
Gave pt avs and calendar  °

## 2018-07-24 NOTE — H&P (Addendum)
Date: 07/24/2018               Patient Name:  Steve [name]. MRN: [id number]  DOB: 07/21/1941 Age / Sex: 77 y.o., male   PCP: Leonard Downing, MD         Medical Service: Internal Medicine Teaching Service         Attending Physician: Dr. Lucious Groves, DO    First Contact: Dr. Myrtie Hawk Pager: [phone]  Second Contact: Dr. Tarri Abernethy Pager: [phone]       After Hours (After 5p/  First Contact Pager: [phone]  weekends / holidays): Second Contact Pager: [phone]   Chief Complaint: AKI   History of Present Illness:  Mr. Troung is a 77yo male with PMH of TIIDM, CKD, HFrEF (45-50%) with grade 1 diastolic dysfunction (7035), PAD, paroxysmal atrial fibrillation, HTN, CAD s/p CABG 2013, recent diagnosis of DVT, gout and iron deficiency anemia presenting to the ED due to elevated creatinine and hyperkalemia found incidentally during his appointment with his hematologist earlier today.  He states he has noticed that during urination his stream has been starting and stopping and has been difficult to initiate over the past three months. He has also been waking up more in the middle of the night to urinate. He denies dysuria but states he had a UTI about six months ago treated with antibiotics and has had one once before this. Today he last urinated earlier in the afternoon and has been unable to urinate to provide a UA.    He also has had increasing inflammation and redness of his LLE that started two weeks ago and was initially thought to be a first time gout flare and treated with indomethacin. He states this is the first time he's been diagnosed with gout, but medication review shows he has been on allpurinol. His symptoms at first seemed to improve but then worsen. When his symptoms did not resolve treatment for gout was then repeated several days ago. He states he often has chronic leg pain due to PAD, but his left foot had been difficult to walk on due to the pain.   He was also placed on  eliquis one month ago (several months per hematologist's note) for right leg DVT. Since that time he has had nose bleeds on the right intermittently. He states it was also around this time he was diagnosed with iron deficiency. He was placed on an iron supplement but has no change in Hb and was sent to hematology for further workup.  He denies recent dizziness, fever, chills, recent illness, sick contacts, changes in bowel movement, bloody or black stool, nausea, vomiting, or weight loss.   Meds:  Current Meds  Medication Sig  . allopurinol (ZYLOPRIM) 100 MG tablet Take 100 mg by mouth daily with breakfast. To prevent gout  . apixaban (ELIQUIS) 5 MG TABS tablet Take 5 mg by mouth every 12 (twelve) hours.  . cilostazol (PLETAL) 100 MG tablet TAKE 1 TABLET TWICE A DAY (Patient taking differently: Take 100 mg by mouth 2 (two) times daily. )  . ferrous sulfate 325 (65 FE) MG tablet Take 325 mg by mouth daily with breakfast.  . indomethacin (INDOCIN) 50 MG capsule Take 50 mg by mouth 4 (four) times daily -  with meals and at bedtime.   Marland Kitchen lisinopril (PRINIVIL,ZESTRIL) 40 MG tablet Take 1 tablet (40 mg total) by mouth daily.  . metFORMIN (GLUCOPHAGE) 1000 MG tablet Take 500 mg by mouth  2 (two) times daily with a meal.  . metoprolol (LOPRESSOR) 100 MG tablet Take 100 mg by mouth every evening.   . vitamin B-12 (CYANOCOBALAMIN) 500 MCG tablet Take 500 mcg by mouth daily.     Allergies: Allergies as of 07/24/2018 - Review Complete 07/24/2018  Allergen Reaction Noted  . Statins Other (See Comments) 06/25/2014   Past Medical History:  Diagnosis Date  . CAD (coronary artery disease)    a. s/p CABG x 29 Nov 2011 with LIMA to LAD, SVG to OM1 and distal LCX, SVG to ramus intermediate  . Essential hypertension   . Hyperlipidemia   . Intermittent claudication ([place])   . Ischemic cardiomyopathy    a. 03/2015 Echo: EF45-50%, Gr1 DD, mild MR.  Marland Kitchen PAF (paroxysmal atrial fibrillation) ([place])    a. post op  atrial fib 11/2011; short course of amiodarone; stopped 01/25/12  . Peripheral arterial disease ([place])   . Right bundle branch block   . Type II diabetes mellitus ([facility])     Family History:  Family History  Problem Relation Age of Onset  . Stroke Mother 67       Cerebellar hemorrhage    Social History:  Lives at home with his son, daughter in law and grandchild in [place] He is a former smoker but quit over 30 years ago.  Drinks 2 drinks per night usually but hasn't had a drink in one week due to being on pain medication  Denies recreational drug use.   Review of Systems: A complete ROS was negative except as per HPI.   Physical Exam: Blood pressure (!) 162/57, pulse (!) 53, temperature 98.1 F (36.7 C), temperature source Oral, resp. rate 13, SpO2 99 %.  Constitution: NAD, pleasant, appears stated age HENT: [place], dried blood from earlier nose bleed Eyes: EOM intact, no scleral icterus Cardio: bradycardic, regular rhythm, no m/r/g Respiratory: clear to auscultation, no wheezing, rales, rhonchi Abdominal: +BS, NTTP, non-distended, soft MSK: +1 pitting edema, sensation intact LE bilaterally  Neuro: A&Ox3, cooperative Skin: LLE erythematous to mid calf, edematous > RLE, petechiae present, metatarsals shiny, inferior aspect with scabbing and dried blood   EKG: personally reviewed my interpretation is left axis deviation, bifascicular block & q waves present on previous EKG but new t-wave inversion.  CXR: personally reviewed my interpretation is no acute findings.   XR Foot: IMPRESSION: No acute osseus abnormality is seen. Electronically Signed   By: Julian Hy M.D.   On: 07/24/2018 19:25    CBC Latest Ref Rng & Units 07/25/2018 07/24/2018 07/24/2018  WBC 4.0 - 10.5 K/uL 5.9 6.6 6.8  Hemoglobin 13.0 - 17.0 g/dL 9.9(L) 9.7(L) 9.9(L)  Hematocrit 39.0 - 52.0 % 29.5(L) 29.2(L) 28.6(L)  Platelets 150 - 400 K/uL 149(L) 152 128(L)    CMP Latest Ref Rng & Units 07/25/2018  07/24/2018 07/24/2018  Glucose 70 - 99 mg/dL 120(H) 126(H) 121(H)  BUN 8 - 23 mg/dL 50(H) 51(H) 54(H)  Creatinine 0.61 - 1.24 mg/dL 3.15(H) 3.31(H) 3.32(HH)  Sodium 135 - 145 mmol/L 136 134(L) 133(L)  Potassium 3.5 - 5.1 mmol/L 4.7 5.5(H) 5.5(H)  Chloride 98 - 111 mmol/L 111 111 109  CO2 22 - 32 mmol/L 14(L) 16(L) 17(L)  Calcium 8.9 - 10.3 mg/dL 9.1 8.9 8.8(L)  Total Protein 6.5 - 8.1 g/dL - 6.6 6.8  Total Bilirubin 0.3 - 1.2 mg/dL - 0.6 0.4  Alkaline Phos 38 - 126 U/L - 59 64  AST 15 - 41 U/L - 12(L) 9(L)  ALT  0 - 44 U/L - 13 13   Urinalysis    Component Value Date/Time   COLORURINE YELLOW 07/25/2018 0000   APPEARANCEUR HAZY (A) 07/25/2018 0000   LABSPEC 1.009 07/25/2018 0000   PHURINE 5.0 07/25/2018 0000   GLUCOSEU NEGATIVE 07/25/2018 0000   HGBUR SMALL (A) 07/25/2018 0000   BILIRUBINUR NEGATIVE 07/25/2018 0000   KETONESUR NEGATIVE 07/25/2018 0000   PROTEINUR NEGATIVE 07/25/2018 0000   UROBILINOGEN 1.0 04/01/2015 0900   NITRITE NEGATIVE 07/25/2018 0000   LEUKOCYTESUR TRACE (A) 07/25/2018 0000      Assessment & Plan by Problem: Active Problems:   Acute renal failure (ARF) ([place])  77yo male with PMH of TIIDM, CKD, HFrEF (45-50%) with grade 1 diastolic dysfunction (6761), PAD, paroxysmal atrial fibrillation, HTN, CAD s/p CABG 2013, recent DVT and recently diagnosed iron deficiency anemia presenting to the ED due to elevated creatinine and hyperkalemia found incidentally during his appointment with his hematologist.   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 Recent changes in urination the last three months could indicate BPH althtough renal US and bladder scans have been wnl, without hydronephrosis or bladder scan showing outlet obstruction or requiring in & out cath. His medications and LLE cellulitis, which has been present >2 weeks and could potentially now be bacteremic, are also  possible contributing factors. Although less likely as his LLE does appear to be cellulities, his metatarsal ulcerations could be secondary to emboli and AKI secondary to renal artheroemboli.   - hold indomethacin, allopurinol, metformin, and cilostazol - UA & urine culture ordered - am RFP - q4h bladder scan  - strict I/O's - NaBicarb 650 mg qd  Iron Deficiency Anemia with Acute Thrombocytopenia Chronic DVT He is on ferrous iron qd, referred to hematology due to receiving iron supplement without resolution of anemia. Ferritin today is 123. Hematology entered handwritten labs from patient's PCP, but it appears the dates listed may not be completely correct. 11.5 (06/08/18) >> 11.8 (09/11) >> 12.9 (07/13/18)  He also was prescribed eliquis bid around the same time for RLE DVT of unclear etiology. He does have a history of paroxysmal afib, which appears to have been observed post-op CABG only per chart review and did not require anticoagulation. He has been having frequent nosebleeds since starting eliquis. The timeline of the hematologist's note and patient history are somewhat different, so it is unclear if his anemia began before or after starting eliquis. Last colonoscopy was >10 years ago. His INR is elevated but unreliable. His anemia could be a result of his worsening renal function as well but appears to preceed his AKI.   - APTT, fibrinogen  - hold eliquis for AKI - switch to heparin drip  - smear review  LLE Cellulitis  Treated as gout for the past two weeks without resolution of symptoms. Uric Acid 8.2 on 07/05/18. DVT thought to be less likely due to eliquis and Well's Score for DVT -1.    - received one dose zosyn in ED, switch to ancef - blood culturex2 - am CBC - PT eval   TIIDM: last HbA1c 4.9.   - hold metformin  - SSI sensitive  Diet: renal/carb modified VTE: heparin drip IVF: none Code: Full  Dispo: Admit patient to Inpatient with expected length of stay greater  than 2 midnights.  SignedMarty Heck, DO 07/24/2018, 11:33 PM  Pager: [phone]

## 2018-07-25 ENCOUNTER — Other Ambulatory Visit: Payer: Self-pay

## 2018-07-25 ENCOUNTER — Inpatient Hospital Stay (HOSPITAL_COMMUNITY): Payer: Medicare Other

## 2018-07-25 ENCOUNTER — Telehealth: Payer: Self-pay | Admitting: Hematology and Oncology

## 2018-07-25 DIAGNOSIS — D696 Thrombocytopenia, unspecified: Secondary | ICD-10-CM

## 2018-07-25 DIAGNOSIS — I2510 Atherosclerotic heart disease of native coronary artery without angina pectoris: Secondary | ICD-10-CM

## 2018-07-25 DIAGNOSIS — D509 Iron deficiency anemia, unspecified: Secondary | ICD-10-CM

## 2018-07-25 DIAGNOSIS — Z888 Allergy status to other drugs, medicaments and biological substances status: Secondary | ICD-10-CM

## 2018-07-25 DIAGNOSIS — R3911 Hesitancy of micturition: Secondary | ICD-10-CM

## 2018-07-25 DIAGNOSIS — L0390 Cellulitis, unspecified: Secondary | ICD-10-CM

## 2018-07-25 DIAGNOSIS — N182 Chronic kidney disease, stage 2 (mild): Secondary | ICD-10-CM

## 2018-07-25 DIAGNOSIS — E1151 Type 2 diabetes mellitus with diabetic peripheral angiopathy without gangrene: Secondary | ICD-10-CM

## 2018-07-25 DIAGNOSIS — E11621 Type 2 diabetes mellitus with foot ulcer: Secondary | ICD-10-CM

## 2018-07-25 DIAGNOSIS — Z951 Presence of aortocoronary bypass graft: Secondary | ICD-10-CM

## 2018-07-25 DIAGNOSIS — E1122 Type 2 diabetes mellitus with diabetic chronic kidney disease: Secondary | ICD-10-CM

## 2018-07-25 DIAGNOSIS — Z86718 Personal history of other venous thrombosis and embolism: Secondary | ICD-10-CM

## 2018-07-25 DIAGNOSIS — Z7984 Long term (current) use of oral hypoglycemic drugs: Secondary | ICD-10-CM

## 2018-07-25 DIAGNOSIS — M109 Gout, unspecified: Secondary | ICD-10-CM

## 2018-07-25 DIAGNOSIS — Z79899 Other long term (current) drug therapy: Secondary | ICD-10-CM

## 2018-07-25 DIAGNOSIS — Z8673 Personal history of transient ischemic attack (TIA), and cerebral infarction without residual deficits: Secondary | ICD-10-CM

## 2018-07-25 DIAGNOSIS — Z7901 Long term (current) use of anticoagulants: Secondary | ICD-10-CM

## 2018-07-25 DIAGNOSIS — Z87440 Personal history of urinary (tract) infections: Secondary | ICD-10-CM

## 2018-07-25 DIAGNOSIS — L03116 Cellulitis of left lower limb: Secondary | ICD-10-CM

## 2018-07-25 DIAGNOSIS — L97528 Non-pressure chronic ulcer of other part of left foot with other specified severity: Secondary | ICD-10-CM

## 2018-07-25 DIAGNOSIS — I5042 Chronic combined systolic (congestive) and diastolic (congestive) heart failure: Secondary | ICD-10-CM

## 2018-07-25 DIAGNOSIS — Z87891 Personal history of nicotine dependence: Secondary | ICD-10-CM

## 2018-07-25 DIAGNOSIS — N401 Enlarged prostate with lower urinary tract symptoms: Secondary | ICD-10-CM

## 2018-07-25 DIAGNOSIS — I130 Hypertensive heart and chronic kidney disease with heart failure and stage 1 through stage 4 chronic kidney disease, or unspecified chronic kidney disease: Secondary | ICD-10-CM

## 2018-07-25 DIAGNOSIS — I480 Paroxysmal atrial fibrillation: Secondary | ICD-10-CM

## 2018-07-25 DIAGNOSIS — R351 Nocturia: Secondary | ICD-10-CM

## 2018-07-25 DIAGNOSIS — N179 Acute kidney failure, unspecified: Principal | ICD-10-CM

## 2018-07-25 LAB — GLUCOSE, CAPILLARY
GLUCOSE-CAPILLARY: 107 mg/dL — AB (ref 70–99)
GLUCOSE-CAPILLARY: 106 mg/dL — AB (ref 70–99)
Glucose-Capillary: 128 mg/dL — ABNORMAL HIGH (ref 70–99)
Glucose-Capillary: 122 mg/dL — ABNORMAL HIGH (ref 70–99)

## 2018-07-25 LAB — SAVE SMEAR

## 2018-07-25 LAB — URINALYSIS, ROUTINE W REFLEX MICROSCOPIC
BILIRUBIN URINE: NEGATIVE
Glucose, UA: NEGATIVE mg/dL
Ketones, ur: NEGATIVE mg/dL
Nitrite: NEGATIVE
PH: 5 (ref 5.0–8.0)
Protein, ur: NEGATIVE mg/dL
SPECIFIC GRAVITY, URINE: 1.009 (ref 1.005–1.030)

## 2018-07-25 LAB — CBC WITH DIFFERENTIAL/PLATELET
ABS IMMATURE GRANULOCYTES: 0 10*3/uL (ref 0.0–0.1)
BASOS PCT: 1 %
Basophils Absolute: 0.1 10*3/uL (ref 0.0–0.1)
EOS ABS: 0.1 10*3/uL (ref 0.0–0.7)
Eosinophils Relative: 2 %
HEMATOCRIT: 29.5 % — AB (ref 39.0–52.0)
Hemoglobin: 9.9 g/dL — ABNORMAL LOW (ref 13.0–17.0)
Immature Granulocytes: 0 %
LYMPHS ABS: 1.6 10*3/uL (ref 0.7–4.0)
Lymphocytes Relative: 27 %
MCH: 30.8 pg (ref 26.0–34.0)
MCHC: 33.6 g/dL (ref 30.0–36.0)
MCV: 91.9 fL (ref 78.0–100.0)
MONO ABS: 0.3 10*3/uL (ref 0.1–1.0)
MONOS PCT: 5 %
Neutro Abs: 3.9 10*3/uL (ref 1.7–7.7)
Neutrophils Relative %: 65 %
PLATELETS: 149 10*3/uL — AB (ref 150–400)
RBC: 3.21 MIL/uL — ABNORMAL LOW (ref 4.22–5.81)
RDW: 15.3 % (ref 11.5–15.5)
WBC: 5.9 10*3/uL (ref 4.0–10.5)

## 2018-07-25 LAB — RENAL FUNCTION PANEL
ANION GAP: 11 (ref 5–15)
Albumin: 3.6 g/dL (ref 3.5–5.0)
BUN: 50 mg/dL — AB (ref 8–23)
CHLORIDE: 111 mmol/L (ref 98–111)
CO2: 14 mmol/L — ABNORMAL LOW (ref 22–32)
Calcium: 9.1 mg/dL (ref 8.9–10.3)
Creatinine, Ser: 3.15 mg/dL — ABNORMAL HIGH (ref 0.61–1.24)
GFR calc Af Amer: 20 mL/min — ABNORMAL LOW (ref >60)
GFR, EST NON AFRICAN AMERICAN: 17 mL/min — AB (ref >60)
Glucose, Bld: 120 mg/dL — ABNORMAL HIGH (ref 70–99)
PHOSPHORUS: 4.8 mg/dL — AB (ref 2.5–4.6)
POTASSIUM: 4.7 mmol/L (ref 3.5–5.1)
Sodium: 136 mmol/L (ref 135–145)

## 2018-07-25 LAB — CK: Total CK: 57 U/L (ref 49–397)

## 2018-07-25 LAB — APTT
APTT: 30 s (ref 24–36)
aPTT: 33 seconds (ref 24–36)

## 2018-07-25 LAB — OCCULT BLOOD, POC DEVICE: FECAL OCCULT BLD: NEGATIVE

## 2018-07-25 LAB — HEPARIN LEVEL (UNFRACTIONATED): Heparin Unfractionated: 2.14 IU/mL — ABNORMAL HIGH (ref 0.30–0.70)

## 2018-07-25 LAB — HAPTOGLOBIN: HAPTOGLOBIN: 100 mg/dL (ref 34–200)

## 2018-07-25 LAB — FIBRINOGEN: FIBRINOGEN: 493 mg/dL — AB (ref 210–475)

## 2018-07-25 MED ORDER — HYDRALAZINE HCL 20 MG/ML IJ SOLN: 5.0000 mg | Freq: Four times a day (QID) | INTRAMUSCULAR | Status: DC | PRN

## 2018-07-25 MED ORDER — SODIUM BICARBONATE 650 MG PO TABS
650.0000 mg | ORAL_TABLET | Freq: Two times a day (BID) | ORAL | Status: DC
  Administered 2018-07-25 – 2018-07-26 (×3): 650 mg via ORAL
  Filled 2018-07-25 (×3): qty 1

## 2018-07-25 NOTE — Progress Notes (Signed)
Pt voided, bladder scan was 147 ml. Will continue to monitor.

## 2018-07-25 NOTE — Progress Notes (Signed)
Pt received from ED, AO x4. Complained of intermittent left leg pain, 5/10. Able to ambulate with 1 person assist. Hypertensive on arrival. Breathing even and unlabored in RA. Connected to heart monitor, CCMD notified. Oriented to room and call bell system. Call light within reach. Will continue to monitor.

## 2018-07-25 NOTE — Progress Notes (Signed)
ANTICOAGULATION CONSULT NOTE - Initial Consult  Pharmacy Consult for Heparin (Apixaban on hold) Indication: History of DVT, also has hx of afib  Allergies  Allergen Reactions  . Statins Other (See Comments)    Myalgias, muscle weakness    Vital Signs: Temp: 97.8 F (36.6 C) (10/01 0413) Temp Source: Oral (10/01 0413) BP: 160/49 (10/01 0413) Pulse Rate: 57 (10/01 0413)  Labs: Recent Labs    07/24/18 1423 07/24/18 1723 07/24/18 1835 07/25/18 0012 07/25/18 0839  HGB 9.9* 9.7*  --  9.9*  --   HCT 28.6* 29.2*  --  29.5*  --   PLT 128* 152  --  149*  --   APTT  --   --   --   --  30  LABPROT  --   --  16.1*  --   --   INR  --   --  1.30  --   --   HEPARINUNFRC  --   --   --   --  2.14*  CREATININE 3.32* 3.31*  --  3.15*  --   CKTOTAL  --   --   --  57  --     Estimated Creatinine Clearance: 20.8 mL/min (A) (by C-G formula based on SCr of 3.15 mg/dL (H)).   Medical History: Past Medical History:  Diagnosis Date  . CAD (coronary artery disease)    a. s/p CABG x 29 Nov 2011 with LIMA to LAD, SVG to OM1 and distal LCX, SVG to ramus intermediate  . Essential hypertension   . Hyperlipidemia   . Intermittent claudication ([place])   . Ischemic cardiomyopathy    a. 03/2015 Echo: EF45-50%, Gr1 DD, mild MR.  Marland Kitchen PAF (paroxysmal atrial fibrillation) ([place])    a. post op atrial fib 11/2011; short course of amiodarone; stopped 01/25/12  . Peripheral arterial disease ([place])   . Right bundle branch block   . Type II diabetes mellitus [facility])     Assessment: 77 y/o M on apixaban PTA for recent DVT, also has hx of afib, here with acute on chronic kidney disease and hyperkalemia. Holding apixaban and starting heparin. Will need to use aPTT to dose for now given apixaban influence on heparin levels. It has been >12 hours since the last apixaban dose.   PTT came back basically normal this AM. However, Rn said that the IV line came out around 6AM and could be out for 1.5 hours. We will continue at  the same rate and repeat another level arround 2 today. HL level is above 2 due to apixaban.   Goal of Therapy:  Heparin level 0.3-0.7 units/ml aPTT 66-102 seconds Monitor platelets by anticoagulation protocol: Yes   Plan:  Continue heparin drip at 1200 units/hr HL at 1400 Daily CBC/HL/aPTT Monitor for bleeding   Onnie Boer, PharmD, BCIDP, AAHIVP, CPP Infectious Disease Pharmacist Pager: [phone] 07/25/2018 10:51 AM

## 2018-07-25 NOTE — Progress Notes (Signed)
Bladder scan was 120 ml, pt voided 100 ml after scan. Will continue to monitor.

## 2018-07-25 NOTE — Progress Notes (Signed)
VASCULAR LAB PRELIMINARY  ARTERIAL  ABI completed:   Right: Resting right ankle-brachial index indicates mild right lower extremity arterial disease. The right toe-brachial index is abnormal.   Left: Resting left ankle-brachial index indicates moderate left lower extremity arterial disease. The left toe-brachial index is abnormal.      RIGHT    LEFT    PRESSURE WAVEFORM  PRESSURE WAVEFORM  BRACHIAL 205 Triphasic BRACHIAL 206 Triphasic  DP 174 Dampened Monophasic DP 135 Dampened Monophasic  PT 149 Dampened Monophasic PT 109 Dampened Monophasic  GREAT TOE 94  GREAT TOE 47     RIGHT LEFT  ABI/TBI 0.84/0.46 0.66/0.23   Steve [name] (RDMS RVT) 07/25/18 4:51 PM

## 2018-07-25 NOTE — Evaluation (Signed)
Physical Therapy Evaluation Patient Details Name: Steve [name]. MRN: [id number] DOB: 1941/04/22 Today's Date: 07/25/2018   History of Present Illness  Pt is a 77 y.o. male admitted 07/24/18 from outside provider's office with elevated creatinine and hyperkalemia; worked up for AKI on CKD II. PMH includes anemia, chronic DVT, HF, DM2, PAD, a-fib, HTN, CAD s/p CABG (2013).    Clinical Impression  Pt presents with an overall decrease in functional mobility secondary to above. PTA, pt mod indep with intermittent use of RW or electric scooter secondary to LLE pain; lives with son who works during day. Today, pt able to perform ADLs, transfer, and amb at supervision-level; stability improved with use of RW. Pt would benefit from continued acute PT services to maximize functional mobility and independence prior to d/c home.     Follow Up Recommendations No PT follow up;Supervision - Intermittent    Equipment Recommendations  None recommended by PT    Recommendations for Other Services       Precautions / Restrictions Precautions Precautions: Fall Restrictions Weight Bearing Restrictions: No      Mobility  Bed Mobility Overal bed mobility: Independent                Transfers Overall transfer level: Independent                  Ambulation/Gait Ambulation/Gait assistance: Supervision Gait Distance (Feet): 200 Feet Assistive device: Rolling walker (2 wheeled);None Gait Pattern/deviations: Step-through pattern;Decreased stride length;Decreased weight shift to left Gait velocity: Decreased Gait velocity interpretation: 1.31 - 2.62 ft/sec, indicative of limited community ambulator General Gait Details: Initial amb with RW secondary to L foot pain, pt able to amb further without DME and intermittent UE support on hand rail secondary to pain; more antalgic amb without UE support. Supervision for balance. Pt able to amb around room pushing IV pole  Stairs             Wheelchair Mobility    Modified Rankin (Stroke Patients Only)       Balance Overall balance assessment: Needs assistance Sitting-balance support: No upper extremity supported Sitting balance-Leahy Scale: Good Sitting balance - Comments: Indep to don socks sitting EOB   Standing balance support: No upper extremity supported;During functional activity Standing balance-Leahy Scale: Fair                               Pertinent Vitals/Pain Pain Assessment: 0-10 Pain Score: 4  Pain Location: L foot Pain Descriptors / Indicators: Sore Pain Intervention(s): Monitored during session    Home Living Family/patient expects to be discharged to:: Private residence Living Arrangements: Children Available Help at Discharge: Family;Available PRN/intermittently Type of Home: House Home Access: Stairs to enter Entrance Stairs-Rails: None Entrance Stairs-Number of Steps: 2 Home Layout: Two level Home Equipment: Walker - 2 wheels;Electric scooter      Prior Function Level of Independence: Independent         Comments: Intermittent use of RW secondary to L foot pain     Hand Dominance        Extremity/Trunk Assessment   Upper Extremity Assessment Upper Extremity Assessment: Overall WFL for tasks assessed    Lower Extremity Assessment Lower Extremity Assessment: Overall WFL for tasks assessed;LLE deficits/detail LLE Deficits / Details: L foot redness/swelling       Communication   Communication: HOH  Cognition Arousal/Alertness: Awake/alert Behavior During Therapy: WFL for tasks assessed/performed Overall Cognitive Status:  Within Functional Limits for tasks assessed                                        General Comments      Exercises     Assessment/Plan    PT Assessment Patient needs continued PT services  PT Problem List Decreased activity tolerance;Decreased balance;Decreased mobility       PT Treatment Interventions DME  instruction;Gait training;Stair training;Functional mobility training;Therapeutic activities;Therapeutic exercise;Balance training;Patient/family education    PT Goals (Current goals can be found in the Care Plan section)  Acute Rehab PT Goals Patient Stated Goal: Decreased L foot pain PT Goal Formulation: With patient Time For Goal Achievement: 08/08/18 Potential to Achieve Goals: Fair    Frequency Min 3X/week   Barriers to discharge        Co-evaluation               AM-PAC PT "6 Clicks" Daily Activity  Outcome Measure Difficulty turning over in bed (including adjusting bedclothes, sheets and blankets)?: None Difficulty moving from lying on back to sitting on the side of the bed? : None Difficulty sitting down on and standing up from a chair with arms (e.g., wheelchair, bedside commode, etc,.)?: None Help needed moving to and from a bed to chair (including a wheelchair)?: A Little Help needed walking in hospital room?: A Little Help needed climbing 3-5 steps with a railing? : A Lot 6 Click Score: 20    End of Session Equipment Utilized During Treatment: Gait belt Activity Tolerance: Patient tolerated treatment well Patient left: in chair;with call bell/phone within reach;with chair alarm set Nurse Communication: Mobility status PT Visit Diagnosis: Other abnormalities of gait and mobility (R26.89);Pain Pain - Right/Left: Left Pain - part of body: Ankle and joints of foot    Time: 0300-9233 PT Time Calculation (min) (ACUTE ONLY): 20 min   Charges:   PT Evaluation $PT Eval Moderate Complexity: 1 Mod         Mabeline Caras, PT, DPT Acute Rehabilitation Services  Pager [phone] Office [place] 07/25/2018, 9:41 AM

## 2018-07-25 NOTE — Progress Notes (Signed)
Subjective: This morning, [name] reports pain in his left foot, though he notes this pain is somewhat better than yesterday. Otherwise, he had no complaints, and there were no acute events overnight. He is a pleasant man, and notes that he wants to do whatever he has to do to get better.   Objective: Vital signs in last 24 hours: Vitals:   07/24/18 2300 07/24/18 2344 07/25/18 0000 07/25/18 0413  BP: (!) 162/57 (!) 173/57 (!) 162/47 (!) 160/49  Pulse: (!) 53 (!) 53 (!) 55 (!) 57  Resp: 13 17 18 18   Temp:  98.4 F (36.9 C) 98.3 F (36.8 C) 97.8 F (36.6 C)  TempSrc:  Oral Oral Oral  SpO2: 99% 100% 100% 100%  Weight:  87.2 kg  87.2 kg  Height:  5\' 9"  (1.753 m)      Intake/Output Summary (Last 24 hours) at 07/25/2018 1029 Last data filed at 07/25/2018 0745 Gross per 24 hour  Intake 228.11 ml  Output 895 ml  Net -666.89 ml   General appearance: alert, cooperative, appears stated age and no distress Lungs: clear to auscultation bilaterally Heart: bradycardic, regular rhythm, no m/r/g; hypertensive; pedal pulses not palpable Abdomen: soft, non-tender; bowel sounds normal; no masses,  no organomegaly Skin: LLE erythematous to distal calf (appears decreased from the skin marking placed yesterday); scabbing present on shin; metatarsals shiny and erythematous; plantar surface of toes with scabbing and dried blood; RLE without erythema or edema    Lab Results: Hgb 9.9 HCT 29.5 Platelets 149  Bicarb 14 BUN 50 Cr 3.15 Phos 4.8  GFR 17  Ferritin 123 Iron 47 TIBC 285 Sat 16  Fibrinogen 493 (elevated) APTT 30 (normal) PT 16.1 (elevated) CK 57 (normal)  Unfractionated heparin 2.14 (elevated)  Fecal occult blood - negative  Micro Results: Blood cultures from 09/30 - no growth at 12 hours  UA - hazy, small Hgb, trace leukocytes, rare bacteria, hyaline casts present  Urine culture - pending  Studies/Results: I have reviewed the following images. CXR 09/30 - Impression:  normal Renal US 09/30 - Impression: R kidney: No mass or hydronephrosis, normal echogenicity. L kidney: no mass or hydronephrosis, normal echogenicity. Bladder normal.  Left food xray 09/30 - Impression: no acute osseus abnormalities seen.   Medications: I have reviewed the patient's current medications. Scheduled Meds: . insulin aspart  0-9 Units Subcutaneous TID WC  . sodium bicarbonate  650 mg Oral BID  . sodium chloride flush  3 mL Intravenous Q12H   Continuous Infusions: .  ceFAZolin (ANCEF) IV 2 g (07/25/18 0959)  . 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 His urinalysis revealed most likely not an intrarenal cause. His medications (indomethacin and allopurinol) are likely the cause of the acute renal failure. This morning, his BUN (50) and Cr (3.15) are improved from yesterday, so we will continue holding his  meds. His bicarb continues to be low, so will continue the sodium bicarb BID. - Continue to hold allopurinol, indomethacin, and cilostazol - Urine culture pending - q4h bladder scan, strict I/O's - NaBicarb 650 mg BID  Iron Deficiency Anemia with acute thrombocytopenia: Steve [name] is followed by hematology for IDA despite iron supplementation; he is on ferrous iron qd at home. His anemia could be a result of worsening renal  function or slow GI bleed (due to his recent black stools).  - Plan for pt to have an outpatient colonoscopy   Chronic DVT: Steve [name] has a recent history of RLE DVT with unclear etiology, and is on eliquis BID. He has had frequent nosebleeds since starting eliquis. PT/INR elevated (16.1), aPTT normal (30), fibrinogen elevated (493). - Continue holding eliquis - Per pharmacy, continue heparin drip at 1200 units/hr and daily CBC/HL/aPTT - Monitor for bleeding - Plan will be to transition back to Eliquis on discharge with close outpatient follow-up   LLE Cellulitis: This morning, it appears that the erythema on his LLE has decreased compared to the skin marking drawn yesterday. Will continue him on ancef, as his cellulitis appears to be improving. Blood cultures show no growth at 12 hours.  - Tylenol 650 mg q6h prn for mild pain or fever - PT eval ordered 09/30 - ABI and TBI indicative of arterial insufficiency   Type 2 Diabetes: Steve [name] last HbA1c was 4.9 (per previous note), so his metformin is being held. - Novolog TID with meals, sensitive scale   HTN: Pt has been hypertensive since arrival, with SBPs ranging from 140-186. - Hydralazine 5 mg IV, prn for SBP > 180; none given yet  Diet: renal/carb modified DVT ppx: heparin drip IVF: None Code: full   This is a Careers information officer Note.  The care of the patient was discussed with Dr. Myrtie Hawk and the assessment and plan formulated with their assistance.  Please see their attached note for official documentation of the daily encounter.   LOS: 1 day   Dimas Millin, Medical Student 07/25/2018, 11:31 AM   Attestation for Student Documentation:  I personally was present and performed or re-performed the history, physical exam and medical decision-making activities of this service and have verified that the service and findings are accurately documented in the student's note.  Ina Homes, MD 07/25/2018, 7:34 PM

## 2018-07-25 NOTE — Progress Notes (Signed)
Report received from [facility], ED nurse.

## 2018-07-25 NOTE — Telephone Encounter (Signed)
Following our initial office visit, the results of his laboratory studies were very surprising.  There was evidence of acute renal failure, hyponatremia, hyperkalemia, anemia, and metabolic acidosis.  These results were significantly different than the previously recorded outpatient labs from Dr. Arelia Sneddon office.  Dr. Tretha Sciara office was notified and called.  The results of his laboratory studies were faxed to his office. I spoke directly to his physician assistant Seth Bake.  Because of the late hour of the day, I recommended immediate emergency department evaluation at [facility].  I called directly to [place] and his son Steve [name].  We spoke during a three-way call.  I informed him of the results.  I recommended in the strongest of terms that he go directly to the emergency department at [facility] for immediate evaluation for his "acute renal failure."  I requested that they notify me following his hospital discharge for an update and possible change in his follow-up schedule.  Although he has iron deficiency anemia, replacing his iron at the present is a much lower priority given his azotemia and renal decompensation.  I placed a call to Dr. Blanchie Dessert, emergency medicine at [facility].  I gave her all of the pertinent laboratory data and my impression.  She was quite accommodating and agreed that Mr. Yoshino should be evaluated as soon as possible.  My initial consultation was dictated.

## 2018-07-25 NOTE — Progress Notes (Signed)
ANTICOAGULATION CONSULT NOTE - Initial Consult  Pharmacy Consult for Heparin (Apixaban on hold) Indication: History of DVT, also has hx of afib  Allergies  Allergen Reactions  . Statins Other (See Comments)    Myalgias, muscle weakness    Vital Signs: Temp: 98.4 F (36.9 C) (10/01 1414) Temp Source: Oral (10/01 0413) BP: 172/73 (10/01 1414) Pulse Rate: 56 (10/01 1414)  Labs: Recent Labs    07/24/18 1423 07/24/18 1723 07/24/18 1835 07/25/18 0012 07/25/18 0839 07/25/18 1419  HGB 9.9* 9.7*  --  9.9*  --   --   HCT 28.6* 29.2*  --  29.5*  --   --   PLT 128* 152  --  149*  --   --   APTT  --   --   --   --  30 33  LABPROT  --   --  16.1*  --   --   --   INR  --   --  1.30  --   --   --   HEPARINUNFRC  --   --   --   --  2.14*  --   CREATININE 3.32* 3.31*  --  3.15*  --   --   CKTOTAL  --   --   --  57  --   --     Estimated Creatinine Clearance: 20.8 mL/min (A) (by C-G formula based on SCr of 3.15 mg/dL (H)).   Medical History: Past Medical History:  Diagnosis Date  . CAD (coronary artery disease)    a. s/p CABG x 29 Nov 2011 with LIMA to LAD, SVG to OM1 and distal LCX, SVG to ramus intermediate  . Essential hypertension   . Hyperlipidemia   . Intermittent claudication ([place])   . Ischemic cardiomyopathy    a. 03/2015 Echo: EF45-50%, Gr1 DD, mild MR.  Marland Kitchen PAF (paroxysmal atrial fibrillation) ([place])    a. post op atrial fib 11/2011; short course of amiodarone; stopped 01/25/12  . Peripheral arterial disease ([place])   . Right bundle branch block   . Type II diabetes mellitus [facility])     Assessment: 77 y/o M on apixaban PTA for recent DVT, also has hx of afib, here with acute on chronic kidney disease and hyperkalemia. Holding apixaban and starting heparin. Will need to use aPTT to dose for now given apixaban influence on heparin levels. It has been >12 hours since the last apixaban dose.   PTT came back basically normal this AM. However, Rn said that the IV line came out  around 6AM and could be out for 1.5 hours. We will continue at the same rate and repeat another level arround 2 today. HL level is above 2 due to apixaban.   PTT at 2pm is still low. Will adjust rate and repeat level tonight.   Goal of Therapy:  Heparin level 0.3-0.7 units/ml aPTT 66-102 seconds Monitor platelets by anticoagulation protocol: Yes   Plan:  Increase heparin to 1400 units/hr 8hr PTT Daily CBC/HL/aPTT Monitor for bleeding   Onnie Boer, PharmD, Jake Samples, AAHIVP, CPP Infectious Disease Pharmacist Pager: [phone] 07/25/2018 3:26 PM

## 2018-07-26 ENCOUNTER — Telehealth: Payer: Self-pay | Admitting: Emergency Medicine

## 2018-07-26 DIAGNOSIS — I70245 Atherosclerosis of native arteries of left leg with ulceration of other part of foot: Secondary | ICD-10-CM

## 2018-07-26 DIAGNOSIS — I5020 Unspecified systolic (congestive) heart failure: Secondary | ICD-10-CM

## 2018-07-26 DIAGNOSIS — I2510 Atherosclerotic heart disease of native coronary artery without angina pectoris: Secondary | ICD-10-CM

## 2018-07-26 DIAGNOSIS — E11621 Type 2 diabetes mellitus with foot ulcer: Secondary | ICD-10-CM

## 2018-07-26 DIAGNOSIS — L97529 Non-pressure chronic ulcer of other part of left foot with unspecified severity: Secondary | ICD-10-CM

## 2018-07-26 DIAGNOSIS — N182 Chronic kidney disease, stage 2 (mild): Secondary | ICD-10-CM

## 2018-07-26 LAB — BASIC METABOLIC PANEL
Anion gap: 8 (ref 5–15)
BUN: 33 mg/dL — ABNORMAL HIGH (ref 8–23)
CO2: 18 mmol/L — ABNORMAL LOW (ref 22–32)
CREATININE: 2.15 mg/dL — AB (ref 0.61–1.24)
Calcium: 9 mg/dL (ref 8.9–10.3)
Chloride: 112 mmol/L — ABNORMAL HIGH (ref 98–111)
GFR calc Af Amer: 32 mL/min — ABNORMAL LOW (ref >60)
GFR calc non Af Amer: 28 mL/min — ABNORMAL LOW (ref >60)
GLUCOSE: 108 mg/dL — AB (ref 70–99)
Potassium: 5.4 mmol/L — ABNORMAL HIGH (ref 3.5–5.1)
Sodium: 138 mmol/L (ref 135–145)

## 2018-07-26 LAB — HEPARIN LEVEL (UNFRACTIONATED): Heparin Unfractionated: 1.18 IU/mL — ABNORMAL HIGH (ref 0.30–0.70)

## 2018-07-26 LAB — CBC
HEMATOCRIT: 29.3 % — AB (ref 39.0–52.0)
Hemoglobin: 9.6 g/dL — ABNORMAL LOW (ref 13.0–17.0)
MCH: 30.8 pg (ref 26.0–34.0)
MCHC: 32.8 g/dL (ref 30.0–36.0)
MCV: 93.9 fL (ref 78.0–100.0)
Platelets: 150 10*3/uL (ref 150–400)
RBC: 3.12 MIL/uL — AB (ref 4.22–5.81)
RDW: 15.9 % — ABNORMAL HIGH (ref 11.5–15.5)
WBC: 5.2 10*3/uL (ref 4.0–10.5)

## 2018-07-26 LAB — GLUCOSE, CAPILLARY
GLUCOSE-CAPILLARY: 144 mg/dL — AB (ref 70–99)
GLUCOSE-CAPILLARY: 89 mg/dL (ref 70–99)
GLUCOSE-CAPILLARY: 103 mg/dL — AB (ref 70–99)
Glucose-Capillary: 95 mg/dL (ref 70–99)

## 2018-07-26 LAB — APTT
APTT: 58 s — AB (ref 24–36)
APTT: 62 s — AB (ref 24–36)

## 2018-07-26 MED ORDER — SODIUM CHLORIDE 0.9 % IV SOLN: INTRAVENOUS | Status: DC | PRN

## 2018-07-26 MED ORDER — CEPHALEXIN 500 MG PO CAPS
500.0000 mg | ORAL_CAPSULE | Freq: Three times a day (TID) | ORAL | Status: DC
  Administered 2018-07-26 – 2018-07-29 (×9): 500 mg via ORAL
  Filled 2018-07-26 (×10): qty 1

## 2018-07-26 MED ORDER — STERILE WATER FOR INJECTION IV SOLN
Freq: Once | INTRAVENOUS | Status: AC
  Administered 2018-07-26: 18:00:00 via INTRAVENOUS
  Filled 2018-07-26: qty 850

## 2018-07-26 MED ORDER — CEPHALEXIN 500 MG PO CAPS: 500.0000 mg | ORAL_CAPSULE | Freq: Four times a day (QID) | ORAL | Status: DC

## 2018-07-26 MED ORDER — METOPROLOL TARTRATE 100 MG PO TABS
100.0000 mg | ORAL_TABLET | Freq: Every day | ORAL | Status: DC
  Administered 2018-07-26: 100 mg via ORAL
  Filled 2018-07-26 (×4): qty 1

## 2018-07-26 NOTE — Progress Notes (Signed)
PT Cancellation Note  Patient Details Name: Steve [name]. MRN: [id number] DOB: [birth year]   Cancelled Treatment:    Reason Eval/Treat Not Completed: Patient declined, no reason specified. Patient politely declining therapy services, stating he has already walked in room several times today. Will follow.  Ellamae Sia, PT, DPT Acute Rehabilitation Services Pager [phone] Office [phone]    Willy Eddy 07/26/2018, 2:41 PM

## 2018-07-26 NOTE — Progress Notes (Signed)
On assessment, it was noted that the patient only had one IV.  The patient is receiving a continuous heparin drip, as well as intermittent infusions of ancef.  Explained to patient that heparin is not supposed to run with other infusions into the same line, and that he needed a second IV.  Patient refused the second IV, stating "they've been running it like that the whole time I've been here."  Upon checking compatibility of heparin and ancef in [employer], it was noted that the two drugs are compatible.  Ancef was run along with heparin infusion without incident.  Will continue to monitor.

## 2018-07-26 NOTE — Progress Notes (Signed)
ANTICOAGULATION CONSULT NOTE - Initial Consult  Pharmacy Consult for Heparin (Apixaban on hold) Indication: History of DVT, also has hx of afib  Allergies  Allergen Reactions  . Statins Other (See Comments)    Myalgias, muscle weakness    Vital Signs: Temp: 97.9 F (36.6 C) (10/02 0454) Temp Source: Oral (10/02 0454) BP: 159/71 (10/02 0454) Pulse Rate: 57 (10/02 0454)  Labs: Recent Labs    07/24/18 1723 07/24/18 1835 07/25/18 0012  07/25/18 0839 07/25/18 1419 07/26/18 0019 07/26/18 0614 07/26/18 0710  HGB 9.7*  --  9.9*  --   --   --   --   --  9.6*  HCT 29.2*  --  29.5*  --   --   --   --   --  29.3*  PLT 152  --  149*  --   --   --   --   --  150  APTT  --   --   --    < > 30 33 62* 58*  --   LABPROT  --  16.1*  --   --   --   --   --   --   --   INR  --  1.30  --   --   --   --   --   --   --   HEPARINUNFRC  --   --   --   --  2.14*  --   --  1.18*  --   CREATININE 3.31*  --  3.15*  --   --   --   --  2.15*  --   CKTOTAL  --   --  57  --   --   --   --   --   --    < > = values in this interval not displayed.    Estimated Creatinine Clearance: 27.9 mL/min (A) (by C-G formula based on SCr of 2.15 mg/dL (H)).   Medical History: Past Medical History:  Diagnosis Date  . CAD (coronary artery disease)    a. s/p CABG x 29 Nov 2011 with LIMA to LAD, SVG to OM1 and distal LCX, SVG to ramus intermediate  . Essential hypertension   . Hyperlipidemia   . Intermittent claudication ([place])   . Ischemic cardiomyopathy    a. 03/2015 Echo: EF45-50%, Gr1 DD, mild MR.  Marland Kitchen PAF (paroxysmal atrial fibrillation) ([place])    a. post op atrial fib 11/2011; short course of amiodarone; stopped 01/25/12  . Peripheral arterial disease ([place])   . Right bundle branch block   . Type II diabetes mellitus [facility])     Assessment: 77 y/o M on apixaban PTA for recent DVT, also has hx of afib, here with acute on chronic kidney disease and hyperkalemia. Holding apixaban and starting heparin. Will need to  use aPTT to dose for now given apixaban influence on heparin levels.   APTT is just below goal this morning at 58 and was drawn early.  No overt bleeding or complications noted.  Goal of Therapy:  Heparin level 0.3-0.7 units/ml aPTT 66-102 seconds Monitor platelets by anticoagulation protocol: Yes   Plan:  Increase IV heparin to 1450 units/hr Daily CBC/HL/aPTT Monitor for bleeding F/u resuming apixaban  Harrietta Guardian, PharmD PGY1 Pharmacy Resident Phone [phone] 07/26/2018    10:40 AM

## 2018-07-26 NOTE — Progress Notes (Signed)
PHARMACY NOTE:  ANTIMICROBIAL RENAL DOSAGE ADJUSTMENT  Current antimicrobial regimen includes a mismatch between antimicrobial dosage and estimated renal function.  As per policy approved by the Pharmacy & Therapeutics and Medical Executive Committees, the antimicrobial dosage will be adjusted accordingly.  Current antimicrobial dosage:  Keflex 500 mg po every 6 hours  Indication: Cellulitis  Renal Function:  Estimated Creatinine Clearance: 27.9 mL/min (A) (by C-G formula based on SCr of 2.15 mg/dL (H)). []      On intermittent HD, scheduled: []      On CRRT    Antimicrobial dosage has been changed to:  500 mg po every 8 hours  Additional comments:   Thank you for allowing pharmacy to be a part of this patient's care.  Alycia Rossetti, PharmD, BCPS Pager: [phone] 1:14 PM

## 2018-07-26 NOTE — Progress Notes (Signed)
ANTICOAGULATION CONSULT NOTE - Initial Consult  Pharmacy Consult for Heparin (Apixaban on hold) Indication: History of DVT, also has hx of afib  Allergies  Allergen Reactions  . Statins Other (See Comments)    Myalgias, muscle weakness    Vital Signs: Temp: 98.2 F (36.8 C) (10/01 2021) Temp Source: Oral (10/01 2021) BP: 154/66 (10/01 2021) Pulse Rate: 74 (10/01 2021)  Labs: Recent Labs    07/24/18 1423 07/24/18 1723 07/24/18 1835 07/25/18 0012 07/25/18 0839 07/25/18 1419 07/26/18 0019  HGB 9.9* 9.7*  --  9.9*  --   --   --   HCT 28.6* 29.2*  --  29.5*  --   --   --   PLT 128* 152  --  149*  --   --   --   APTT  --   --   --   --  30 33 62*  LABPROT  --   --  16.1*  --   --   --   --   INR  --   --  1.30  --   --   --   --   HEPARINUNFRC  --   --   --   --  2.14*  --   --   CREATININE 3.32* 3.31*  --  3.15*  --   --   --   CKTOTAL  --   --   --  57  --   --   --     Estimated Creatinine Clearance: 20.8 mL/min (A) (by C-G formula based on SCr of 3.15 mg/dL (H)).   Medical History: Past Medical History:  Diagnosis Date  . CAD (coronary artery disease)    a. s/p CABG x 29 Nov 2011 with LIMA to LAD, SVG to OM1 and distal LCX, SVG to ramus intermediate  . Essential hypertension   . Hyperlipidemia   . Intermittent claudication ([place])   . Ischemic cardiomyopathy    a. 03/2015 Echo: EF45-50%, Gr1 DD, mild MR.  Marland Kitchen PAF (paroxysmal atrial fibrillation) ([place])    a. post op atrial fib 11/2011; short course of amiodarone; stopped 01/25/12  . Peripheral arterial disease ([place])   . Right bundle branch block   . Type II diabetes mellitus [facility])     Assessment: 77 y/o M on apixaban PTA for recent DVT, also has hx of afib, here with acute on chronic kidney disease and hyperkalemia. Holding apixaban and starting heparin. Will need to use aPTT to dose for now given apixaban influence on heparin levels. It has been >12 hours since the last apixaban dose.   APTT at goal this evening.   No overt bleeding or complications noted.  Goal of Therapy:  Heparin level 0.3-0.7 units/ml aPTT 66-102 seconds Monitor platelets by anticoagulation protocol: Yes   Plan:  Continue IV heparin at 1400 units/hr 8hr PTT Daily CBC/HL/aPTT Monitor for bleeding  Marguerite Olea, [facility] Clinical Pharmacist Phone [phone]  07/26/2018 1:25 AM

## 2018-07-26 NOTE — Progress Notes (Signed)
Internal Medicine Attending:   I saw and examined the patient. I reviewed the Dr Darcey Nora note and I agree with the resident's findings and plan as documented in the resident's note. Patient doing well, no acute complaints, cellulitis is improved, renal function improving.  ABI show worsened left leg disease, given ulcer on left foot we have asked Vascular surgery to consult, pending their evaluation I would like him to remain on heparin in case surgery is needed.

## 2018-07-26 NOTE — Consult Note (Addendum)
Hospital Consult    Reason for Consult:  Ulcers on left toes Requesting Physician:  Tarri Abernethy MRN #:  [id number]  History of Present Illness: This is a 77 y.o. male who has iron deficiency anemia.  He states he went and had his labs drawn earlier this week and received a call at home from his doctor saying he needed to go to the hospital bc his kidneys weren't working.   His creatinine on admission was 3.31 and today, it is down to 2.15.  He is still hyperkalemic with K+ of 5.4 this morning.   He states that his left foot starting hurting about a month ago and he was treated for gout.  He states that he had pain medication and it helped but when he woke up Tuesday morning, his pain was a lot worse.  He states he could barely get up and had to use his walker.  He states prior to all this happening, he could walk about a city block before having to stop and rest due to weakness in both legs (equally).  Once he rests, he can start back and walk about the same distance.  He states that since being in the hospital, the pain in his left foot is a little better.  He states he has had blood flow issues and was placed on a medication that did help him (Pletal).  He did not know he had wounds on the toes as he said he couldn't see them.   In 2009, he underwent aortogram by Dr. Scot Dock with the following findings: CONCLUSIONS:  1. Right superficial femoral artery occlusion at the adductor canal,      with severe tibial occlusive disease.  2. Occluded popliteal artery on the left, with severe tibial occlusive      disease.  He is on Eliquis for hx of right leg DVT.  He did not have an injury to that leg.  His son also has hx of DVT.   At present time, he is on IV heparin.  He does drink alcohol ([employer]), but has cut back.  He has a remote tobacco hx as he quit smoking 40 years ago.    He is on oral agents for diabetes.  He is not sure if he has neuropathy.    Prior to admission, he was on a beta  blocker and ACEI for blood pressure management.    Family hx is negative for AAA.  He has hx of CABG x 4 in 2013 by Dr. Nils Pyle.  His left greater saphenous vein was harvested.   Past Medical History:  Diagnosis Date  . CAD (coronary artery disease)    a. s/p CABG x 29 Nov 2011 with LIMA to LAD, SVG to OM1 and distal LCX, SVG to ramus intermediate  . Essential hypertension   . Hyperlipidemia   . Intermittent claudication ([place])   . Ischemic cardiomyopathy    a. 03/2015 Echo: EF45-50%, Gr1 DD, mild MR.  Marland Kitchen PAF (paroxysmal atrial fibrillation) ([place])    a. post op atrial fib 11/2011; short course of amiodarone; stopped 01/25/12  . Peripheral arterial disease ([place])   . Right bundle branch block   . Type II diabetes mellitus ([place])     Past Surgical History:  Procedure Laterality Date  . CARDIAC CATHETERIZATION  01/16/1999   Est. EF of 65% -- Nonobstruction atherosclerotic coronary artery disease -- Normal left ventricular function      . CARDIAC ELECTROPHYSIOLOGY STUDY AND ABLATION  05/21/1999  Normal sinus funtion -- Mildly prolonged interatrial conduction times -- Normal A-V node funtion -- Normal His Purkinje system function -- fNo accessory pathway -- No inducible ventricular tachycardia in the presence of the or in the absence of isoproterenol with programmed stimulation or with burst pacing -- Nikki Dom, M.D. FACC surgan          . CHOLECYSTECTOMY  1999  . CORONARY ARTERY BYPASS GRAFT  12/10/2011   Procedure: CORONARY ARTERY BYPASS GRAFTING (CABG);  Surgeon: Tharon Aquas Adelene Idler, MD;  Location: [place];  Service: Open Heart Surgery;  Laterality: N/A;  Coronary Artery bypass graft on pump times four utilizing left internal mammary artery and left saphenous vein harvested endoscopically  . LEFT HEART CATHETERIZATION WITH CORONARY ANGIOGRAM N/A 12/09/2011   Procedure: LEFT HEART CATHETERIZATION WITH CORONARY ANGIOGRAM;  Surgeon: Peter M Martinique, MD;  Location: [facility] CATH LAB;  Service:  Cardiovascular;  Laterality: N/A;    Allergies  Allergen Reactions  . Statins Other (See Comments)    Myalgias, muscle weakness    Prior to Admission medications   Medication Sig Start Date End Date Taking? Authorizing Provider  allopurinol (ZYLOPRIM) 100 MG tablet Take 100 mg by mouth daily with breakfast. To prevent gout 07/18/18  Yes [provider]  apixaban (ELIQUIS) 5 MG TABS tablet Take 5 mg by mouth every 12 (twelve) hours.   Yes [provider]  cilostazol (PLETAL) 100 MG tablet TAKE 1 TABLET TWICE A DAY Patient taking differently: Take 100 mg by mouth 2 (two) times daily.  06/23/18  Yes Martinique, Peter M, MD  ferrous sulfate 325 (65 FE) MG tablet Take 325 mg by mouth daily with breakfast.   Yes [provider]  indomethacin (INDOCIN) 50 MG capsule Take 50 mg by mouth 4 (four) times daily -  with meals and at bedtime.  07/18/18  Yes [provider]  lisinopril (PRINIVIL,ZESTRIL) 40 MG tablet Take 1 tablet (40 mg total) by mouth daily. 07/17/18  Yes Martinique, Peter M, MD  metFORMIN (GLUCOPHAGE) 1000 MG tablet Take 500 mg by mouth 2 (two) times daily with a meal.   Yes [provider]  metoprolol (LOPRESSOR) 100 MG tablet Take 100 mg by mouth every evening.  02/11/15  Yes [provider]  vitamin B-12 (CYANOCOBALAMIN) 500 MCG tablet Take 500 mcg by mouth daily.   Yes [provider]  furosemide (LASIX) 40 MG tablet Take 1 tablet (40 mg total) by mouth daily. For 4 days then stop. 12/15/11 01/05/12  Nani Skillern, PA-C  potassium chloride SA (K-DUR,KLOR-CON) 20 MEQ tablet Take 1 tablet (20 mEq total) by mouth daily. For 4 days the stop. 12/15/11 01/05/12  Nani Skillern, PA-C    Social History   Socioeconomic History  . Marital status: Widowed    Spouse name: Not on file  . Number of children: 1  . Years of education: Not on file  . Highest education level: Not on file  Occupational History  . 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 Financial resource strain: Not on file  . Food insecurity:    Worry: Not on file    Inability: Not on file  . Transportation needs:    Medical: Not on file    Non-medical: Not on file  Tobacco Use  . Smoking status: Former Smoker    Packs/day: 1.50    Years: 20.00    Pack years: 30.00    Types: Cigarettes  Last attempt to quit: 05/27/1979    Years since quitting: 39.1  . Smokeless tobacco: Former Network engineer and Sexual Activity  . Alcohol use: Yes    Alcohol/week: 4.0 standard drinks    Types: 4 Shots of liquor per week    Comment: 4 "canadian clubs each night"  . Drug use: No  . Sexual activity: Yes  Lifestyle  . Physical activity:    Days per week: Not on file    Minutes per session: Not on file  . Stress: Not on file  Relationships  . Social connections:    Talks on phone: Not on file    Gets together: Not on file    Attends religious service: Not on file    Active member of club or organization: Not on file    Attends meetings of clubs or organizations: Not on file    Relationship status: Not on file  . Intimate partner violence:    Fear of current or ex partner: Not on file    Emotionally abused: Not on file    Physically abused: Not on file    Forced sexual activity: Not on file  Other Topics Concern  . Not on file  Social History Narrative  . Not on file     Family History  Problem Relation Age of Onset  . Stroke Mother 58       Cerebellar hemorrhage    ROS: [x]  Positive   [ ]  Negative   [ ]  All sytems reviewed and are negative  Cardiac: []  chest pain/pressure []  palpitations []  SOB lying flat []  DOE [x]  hx CABG  Vascular: [x]  pain in legs while walking [x]  pain in left foot []  pain in legs at night [x]  non-healing ulcers-toes left foot [x]  hx of DVT-on Eliquis [x]  swelling in left leg/foot  Pulmonary: []  productive cough []  asthma/wheezing []  home O2  Neurologic: []   weakness in []  arms []  legs []  numbness in []  arms []  legs []  hx of CVA []  mini stroke [] difficulty speaking or slurred speech []  temporary loss of vision in one eye []  dizziness  Hematologic: []  hx of cancer []  bleeding problems []  problems with blood clotting easily  Endocrine:   [x]  diabetes []  thyroid disease  GI []  vomiting blood []  blood in stool [x]  dark stools (on iron supplement)  GU: [x]  CKD/renal failure []  HD--[]  M/W/F or []  T/T/S []  burning with urination []  blood in urine  Psychiatric: []  anxiety []  depression  Musculoskeletal: []  arthritis []  joint pain [x]  gout  Integumentary: []  rashes [x]  ulcers  Constitutional: []  fever []  chills   Physical Examination  Vitals:   07/25/18 2021 07/26/18 0454  BP: (!) 154/66 (!) 159/71  Pulse: 74 (!) 57  Resp: 20 20  Temp: 98.2 F (36.8 C) 97.9 F (36.6 C)  SpO2: 97% 100%   Body mass index is 27.6 kg/m.  General:  WDWN in NAD Gait: Not observed HENT: WNL, normocephalic Pulmonary: normal non-labored breathing, without Rales, rhonchi,  wheezing Cardiac: regular, without  Murmur without carotid bruits Abdomen:  soft, NT/ND, no masses Skin: without rashes Vascular Exam/Pulses:  Right Left  Radial 2+ (normal) 2+ (normal)  Femoral 2+ (normal) 2+ (normal)  DP monophasic monophasic  PT Unable to obtain monophasic  Peroneal monophasic monophasic   Extremities: left leg with mild swelling;     Musculoskeletal: no muscle wasting or atrophy  Neurologic: A&O X 3;  No focal weakness or paresthesias are detected; speech is fluent/normal  Psychiatric:  The pt has Normal affect. Lymph:  No inguinal lymphadenopathy   CBC    Component Value Date/Time   WBC 5.2 07/26/2018 0710   RBC 3.12 (L) 07/26/2018 0710   HGB 9.6 (L) 07/26/2018 0710   HGB 9.9 (L) 07/24/2018 1423   HCT 29.3 (L) 07/26/2018 0710   PLT 150 07/26/2018 0710   PLT 128 (L) 07/24/2018 1423   MCV 93.9 07/26/2018 0710   MCH 30.8  07/26/2018 0710   MCHC 32.8 07/26/2018 0710   RDW 15.9 (H) 07/26/2018 0710   LYMPHSABS 1.6 07/25/2018 0012   MONOABS 0.3 07/25/2018 0012   EOSABS 0.1 07/25/2018 0012   BASOSABS 0.1 07/25/2018 0012    BMET    Component Value Date/Time   NA 138 07/26/2018 0614   K 5.4 (H) 07/26/2018 0614   CL 112 (H) 07/26/2018 0614   CO2 18 (L) 07/26/2018 0614   GLUCOSE 108 (H) 07/26/2018 0614   BUN 33 (H) 07/26/2018 0614   CREATININE 2.15 (H) 07/26/2018 0614   CALCIUM 9.0 07/26/2018 0614   GFRNONAA 28 (L) 07/26/2018 0614   GFRAA 32 (L) 07/26/2018 0614    COAGS: Lab Results  Component Value Date   INR 1.30 07/24/2018   INR 1.09 04/01/2015   INR 1.45 12/10/2011     Non-Invasive Vascular Imaging:   ABI's 07/25/18: Right: Resting right ankle-brachial index indicates mild right lower extremity arterial disease. The right toe-brachial index is abnormal.   Left: Resting left ankle-brachial index indicates moderate left lower extremity arterial disease. The left toe-brachial index is abnormal.      RIGHT    LEFT    PRESSURE WAVEFORM  PRESSURE WAVEFORM  BRACHIAL 205 Triphasic BRACHIAL 206 Triphasic  DP 174 Dampened Monophasic DP 135 Dampened Monophasic  PT 149 Dampened Monophasic PT 109 Dampened Monophasic  GREAT TOE 94  GREAT TOE 47     RIGHT LEFT  ABI/TBI 0.84/0.46 0.66/0.23     Statin:  No.-allergy Beta Blocker:  Yes.   Aspirin:  No. ACEI:  Yes.   (PTA) ARB:  No. CCB use:  No Other antiplatelets/anticoagulants:  Yes.   Eliquis   ASSESSMENT/PLAN: This is a 77 y.o. male with hx of PAD, CAD, CKD II, DM with non healing ulcers on the toes of the left foot.   -pt with hx of PAD as he had an aortogram in 2009 by Dr. Scot Dock, which at that time revealed a right SFA occlusion with severe tibial dz and an occluded popliteal artery on the left with severe tibial occlusive dz. -he now has non healing wounds on the toes 2-5 on the left.  His ABI and TBI are decreased  bilaterally with left > right.    -He presented with acute renal failure. His creatinine is trending downward, but still 2.15 today.  He may need an arteriogram to evaluate his blood flow, but may need CO2 given his renal function.    -he does have hx of recent DVT in the right leg and was started on Eliquis.  He is currently on IV heparin.   -Dr. Donnetta Hutching to see the pt later on today.   Leontine Locket, PA-C [facility] [phone]   I have examined the patient, reviewed and agree with above.  The patient has had marked improvement in the erythema and pain in the dorsum and plantar aspect of his left foot on antibiotics.  Has had improvement in his renal insufficiency with hydration.  2+ femoral pulses bilaterally.  Absent popliteal and distal pulses bilaterally.  The patient had a formal arteriogram with Dr. Scot Dock 10 years ago which revealed left popliteal occlusion and severe tibial disease at that time.  Had long discussion with the patient and his family present.  Explained my concern that this may not be reconstructable.  Will need arteriography for further evaluation.  Would continue to hold Eliquis.  Continue heparin drip.  Continue hydration.  Will plan arteriography on Friday and make further recommendations pending these results  Curt Jews, MD 07/26/2018 7:07 PM

## 2018-07-26 NOTE — Progress Notes (Signed)
Subjective: This morning, Mr. Aungst continues to report pain in the toes of his left foot. He notes that he was having pain when he would walk to the bathroom yesterday. However, the pain was tolerable for him, so he did not take Tylenol. He also notes some burning pains in his toes while laying at rest in the bed. He denies chest pain and blood in his urine or stool. Otherwise, he says that he has no complaints, and his only concern is his left foot. He is anxious to get home.   Of note, at baseline for his PAD, he reports weakness with walking, and this makes him have to stop and rest.  Objective: Vital signs in last 24 hours: Vitals:   07/25/18 0413 07/25/18 1414 07/25/18 2021 07/26/18 0454  BP: (!) 160/49 (!) 172/73 (!) 154/66 (!) 159/71  Pulse: (!) 57 (!) 56 74 (!) 57  Resp: 18 16 20 20   Temp: 97.8 F (36.6 C) 98.4 F (36.9 C) 98.2 F (36.8 C) 97.9 F (36.6 C)  TempSrc: Oral  Oral Oral  SpO2: 100% 100% 97% 100%  Weight: 87.2 kg   84.8 kg  Height:       Weight change: -2.423 kg  Intake/Output Summary (Last 24 hours) at 07/26/2018 1141 Last data filed at 07/26/2018 0308 Gross per 24 hour  Intake 497.77 ml  Output 775 ml  Net -277.23 ml   General appearance: alert, cooperative, appears stated age and no distress Lungs: clear to auscultation bilaterally Heart: bradycardic, regular rhythm, no m/r/g; hypertensive; dorsal pedal pulses diminished but palpable Abdomen: soft, non-tender; bowel sounds normal; no masses,  no organomegaly Skin: LLE erythmatous to the ankle (appears decreased from yesterday); scabbing present on shin; metatarsals shiny and erythematous; plantar surface of left fourth toe with unstageable dry ulcer; RLE without erythema or edema   Lab Results: Hgb 9.6  BUN 33 Cr 2.15 Bicarb 18 K 5.4  Micro Results: Blood cultures from 09/30 - no growth at 48 hours   Studies/Results: ABI 10/01:  - Right ABI/TBI: 0.84/0.46 - Left ABI/TBI:  0.66/0.23  Medications: I have reviewed the patient's current medications. Scheduled Meds: . cephALEXin  500 mg Oral Q6H  . insulin aspart  0-9 Units Subcutaneous TID WC  . metoprolol tartrate  100 mg Oral Daily  . sodium chloride flush  3 mL Intravenous Q12H   Continuous Infusions: . sodium chloride    . heparin 1,400 Units/hr (07/25/18 1705)  .  sodium bicarbonate (isotonic) infusion in sterile water     PRN Meds:.sodium chloride, acetaminophen **OR** acetaminophen, hydrALAZINE   Assessment/Plan: Principal Problem:   Acute renal failure (ARF) (HCC) Active Problems:   Peripheral arterial disease (HCC)   Normocytic anemia   Thrombocytopenia (HCC)   Cellulitis of left anterior lower leg  Mr. 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 His bicarb (18) is improved from yesterday, but continues to be low.  - Continue to hold indomethacin, allopurinol, and cilostazol. Plan to restart cilostazol upon discharge.  - Hold NSAIDs - Switched to IV NaBicarb for one dose    - Urine  culture pending   LLE Cellulitis: This morning, it appears that the erythema on his LLE has decreased compared to yesterday (10/01). Today, we switched his IV ancef to oral cephalexin, 500 mg QID for 4 days. As his cellulitis appears to have already improved very well, we will have him take 4 days of oral cephalexin to complete a 5 day course of antibiotics.   PAD: The ABI/TBI from 10/10 revealed mild arterial disease in the right lower extremity (0.84/0/46) and moderate arterial disease in the left lower extremity (0.66/0.23). His left lower  extremity has worsened disease since last year, when his ABI/TBI was 0.89/0.34. We consulted vascular today, and suggested that he may need an arteriogram to evaluate blood flow.   Chronic DVT: Mr. Mcgue has a recent history of RLE DVT with unclear etiology. We will continue holding his eliquis, and continue heparin drip. - Monitor for bleeding - Plan to transition back to Eliquis at discharge with close outpatient follow-up  Iron Deficiency Anemia with acute thrombocytopenia: Mr. Brandt is followed by hematology for IDA despite iron supplementation; he is on ferrous iron qd at home. His anemia could be a result of worsening renal function or slow GI bleed (due to his recent black stools). Will recommend that he have an outpatient colonoscopy and continue following-up with his PCP.  Type 2 Diabetes: Mr. Montilla last HbA1c was 4.9 (per previous note), so his metformin is being held. He does not take insulin for his diabetes. His blood sugars have been mildly elevated throughout his hospital stay. - Novolog TID with meals, sensitive scale   HTN: Pt has been hypertensive since arrival, with SBPs ranging from 140-186. He was started on metoprolol 100 mg qd today.   Diet: renal/carb modified DVT ppx: heparin drip IVF: None Code: full  Dispo: Plan for discharge today or tomorrow, depending on note from Vascular.  This is a Careers information officer Note.  The care of the patient was discussed with Dr. Myrtie Hawk and the assessment and plan formulated with their assistance.  Please see their attached note for official documentation of the daily encounter.   LOS: 2 days   Dimas Millin, Medical Student 07/26/2018, 1:14 PM  I have seen and examined the patient, and reviewed the daily progress note by Nena Polio MS and discussed the care of the patient with her.   Signed:  Dewayne Hatch, MD 07/26/2018, 1:23 PM

## 2018-07-26 NOTE — Telephone Encounter (Signed)
Faxed 07/24/18 visit with MD Audelia Hives to MD Leonard Downing.  Fax received.

## 2018-07-27 DIAGNOSIS — F329 Major depressive disorder, single episode, unspecified: Secondary | ICD-10-CM

## 2018-07-27 DIAGNOSIS — R45851 Suicidal ideations: Secondary | ICD-10-CM

## 2018-07-27 LAB — GLUCOSE, CAPILLARY
GLUCOSE-CAPILLARY: 102 mg/dL — AB (ref 70–99)
GLUCOSE-CAPILLARY: 89 mg/dL (ref 70–99)
GLUCOSE-CAPILLARY: 98 mg/dL (ref 70–99)
Glucose-Capillary: 111 mg/dL — ABNORMAL HIGH (ref 70–99)

## 2018-07-27 LAB — BASIC METABOLIC PANEL
ANION GAP: 5 (ref 5–15)
BUN: 22 mg/dL (ref 8–23)
CHLORIDE: 111 mmol/L (ref 98–111)
CO2: 20 mmol/L — ABNORMAL LOW (ref 22–32)
Calcium: 8.4 mg/dL — ABNORMAL LOW (ref 8.9–10.3)
Creatinine, Ser: 1.79 mg/dL — ABNORMAL HIGH (ref 0.61–1.24)
GFR calc Af Amer: 40 mL/min — ABNORMAL LOW (ref >60)
GFR, EST NON AFRICAN AMERICAN: 34 mL/min — AB (ref >60)
Glucose, Bld: 102 mg/dL — ABNORMAL HIGH (ref 70–99)
POTASSIUM: 4.4 mmol/L (ref 3.5–5.1)
SODIUM: 136 mmol/L (ref 135–145)

## 2018-07-27 LAB — APTT: aPTT: 61 seconds — ABNORMAL HIGH (ref 24–36)

## 2018-07-27 LAB — HEMOGLOBIN AND HEMATOCRIT, BLOOD
HEMATOCRIT: 27.1 % — AB (ref 39.0–52.0)
HEMOGLOBIN: 9 g/dL — AB (ref 13.0–17.0)

## 2018-07-27 LAB — HEPARIN LEVEL (UNFRACTIONATED): Heparin Unfractionated: 0.64 IU/mL (ref 0.30–0.70)

## 2018-07-27 MED ORDER — LACTATED RINGERS IV SOLN: INTRAVENOUS | Status: AC

## 2018-07-27 MED ORDER — AMLODIPINE BESYLATE 5 MG PO TABS
5.0000 mg | ORAL_TABLET | Freq: Every day | ORAL | Status: DC
  Filled 2018-07-27: qty 1

## 2018-07-27 NOTE — Progress Notes (Signed)
Internal Medicine Attending:   I saw and examined the patient. I reviewed the resident's note and I agree with the resident's findings and plan as documented in the resident's note.  Left foot pain improved, errythema continues to regress. Renal function continues to have improvement.  Vasc Sx plans for arteriogram tomorrow, will plan to continue IV hydration and IV heparin in preparation.

## 2018-07-27 NOTE — Progress Notes (Signed)
Subjective: Mr. Steve Steve [name] appeared in good spirits this morning. Steve [name], he reports that the pain in his left foot is much better, though he still reports some pins and needles when he walks. He told us that he is ready for the arteriogram Steve [name], though he is concerned about the potential of having his foot amputated. He states, "I won't lie, I've thought about committing suicide." He says these are fleeting thoughts, and he tries to push these thoughts out of his mind. When asked if he had thought further about suicide, he stated that if he decided to go through with it, he would "just take a handful of aspirin and go to sleep." We discussed with him that if he has anymore of these thoughts, he should let us know.   Objective: Vital signs in last 24 hours: Vitals:   07/27/18 0548 07/27/18 1027 07/27/18 1030 07/27/18 1327  BP: (!) 179/58  (!) 154/58 (!) 137/93  Pulse: (!) 45 (!) 48 (!) 52 68  Resp:    18  Temp: 98.3 F (36.8 C)     TempSrc: Oral     SpO2: 98%   100%  Weight:      Height:        Intake/Output Summary (Last 24 hours) at 07/27/2018 1559 Last data filed at 07/27/2018 1129 Gross per 24 hour  Intake 1139.19 ml  Output 600 ml  Net 539.19 ml   General appearance: alert, cooperative, appears stated age, no distress  Lungs: clear to auscultation bilaterally Heart: bradycardic, regular rhythm; hypertensive, dorsal pedal pulses diminished by palpable. Abdomen: soft, non-tender; bowel sounds normal; no masses,  no organomegaly Skin: L foot erythematous (appears decreased from yesterday); scabbing present on shin; metatarsals shiny and erythematous; plantar surface of left toes with unstageable dry ulcer  Lab Results: Hgb 9.0  BUN 22 Cr 1.79 K 4.4 Bicarb 20  Micro Results: Blood cultures from 09/30 - no growth at 3 days  Studies/Results: ABI 10/01:  - Right ABI/TBI: 0.84/0.46 - Left ABI/TBI: 0.66/0.23   Medications: I have reviewed the patient's current  medications. Scheduled Meds: . amLODipine  5 mg Oral Daily  . cephALEXin  500 mg Oral Q8H  . metoprolol tartrate  100 mg Oral Daily  . sodium chloride flush  3 mL Intravenous Q12H   Continuous Infusions: . sodium chloride    . heparin 1,550 Units/hr (07/27/18 0449)  . lactated ringers     PRN Meds:.sodium chloride, acetaminophen **OR** acetaminophen   Assessment/Plan: Principal Problem:   Acute renal failure (ARF) (HCC) Active Problems:   Peripheral arterial disease (HCC)   Normocytic anemia   Thrombocytopenia (HCC)   Cellulitis of left anterior lower leg   Diabetic ulcer of toe of left foot associated with type 2 diabetes mellitus [facility])  Mr. Steve Steve [name] is a 77 yo male with a past medical history of TIIDM, HFrEF 45% (2016), CAD s/p CABG 2013, PAD, CVA, HTN, recent DVT, CKD, iron deficiency anemia, and recent diagnosis of gout who is here for acute renal failure and LLE cellulitis, awaiting arteriogram for PAD.  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 His bicarb (20) is improved from yesterday. Attempted to give Mr. Steve Steve [name] he will receive Steve [name] for his arteriogram, but he refused a new IV. - Continue to hold indomethacin, allopurinol, and cilostazol. Plan to restart cilostazol upon discharge.  -  Hold NSAIDs  LLE Cellulitis: This morning, it appears that the erythema on his LLE continues to decrease. He will continue for 3 more days on po cephalexin 500 mg TID.    PAD: Vascular saw Steve Steve [name] yesterday, and they are planning for an arteriogram Steve [name] (10/04). - NPO at midnight  Chronic DVT: Steve Steve [name] has a recent history of RLE DVT with unclear etiology. We will continue holding his eliquis, and continue heparin drip. Pharmacy following. Plan to transition back to Eliquis at discharge with close outpatient follow-up.   HTN: Pt has been  hypertensive since arrival, with SBPs ranging from 140-186. We had considered starting amlodipine Steve [name] for further BP control. However, he was noted to be bradycardic Steve [name], so his metoprolol dose was held and will wait to start amlodipine.  Iron Deficiency Anemia with acute thrombocytopenia: Mr. Steve Steve [name] is followed by hematology for IDA despite iron supplementation; he is on ferrous iron qd at home. His anemia could be a result of worsening renal function or slow GI bleed (due to his recent black stools). Will recommend that he have an outpatient colonoscopy and continue following-up with his PCP.  Type 2 Diabetes: Continue holding metformin. His blood sugars have been mildly elevated throughout his hospital stay. - Novolog TID with meals, sensitive scale   DHx of depressed mood: Currently has normal mood. normal affect. Normal judgment. No suicidal thought.  Encouraged to let us know if negative thoughts or suicidal ideation came back. -Monitor and follow up at out patinet  Diet: NPO at midnight DVT ppx: heparin drip [id number] Steve [name] Steve [name] Code: full Dispo: Plan for arteriogram Steve [name]  This is a Careers information officer Note. The care of the patient was discussed with Dr. Myrtie Steve [name] and the assessment and plan formulated with their assistance.  Please see their attached note for official documentation of the daily encounter.   LOS: 3 days   Dimas Millin, Medical Student 07/27/2018, 4:19 PM

## 2018-07-27 NOTE — Progress Notes (Signed)
Patient's HR 48-52 upon medication pass. Patient has scheduled Metoprolol and Amlodipine for 1000. Dr. Myrtie Hawk paged and stated to hold Metoprolol. Patient refusing Amlodipine at this time. Dr. Myrtie Hawk made aware. Will continue to monitor.

## 2018-07-27 NOTE — Care Management Important Message (Signed)
Important Message  Patient Details  Name: Steve [name]. MRN: [id number] Date of Birth: Apr 02, 1941   Medicare Important Message Given:  Yes    Deegan Valentino 07/27/2018, 3:20 PM

## 2018-07-27 NOTE — Progress Notes (Signed)
Physical Therapy Treatment & Discharge Patient Details Name: Steve [name]. MRN: [id number] DOB: 1941/08/15 Today's Date: 07/27/2018    History of Present Illness Pt is a 77 y.o. male admitted 07/24/18 from outside provider's office with elevated creatinine and hyperkalemia; worked up for AKI on CKD II. Planned arteriography on Friday 10/4. PMH includes anemia, chronic DVT, HF, DM2, PAD, a-fib, HTN, CAD s/p CABG (2013).   PT Comments    Pt has progressed well with mobility. Mod indep ambulating with RW to offload L foot pain. Pt reports he eventually plans to move to "rest home" after discharge (suggesting involving SW with this, which pt declined). Encouraged continue use of RW upon return home for added stability. Pt declined stair training. Has met short-term acute PT goals; has no further questions or concerns. Will d/c acute PT. Please reconsult if new needs arise.   Follow Up Recommendations  No PT follow up;Supervision - Intermittent     Equipment Recommendations  None recommended by PT    Recommendations for Other Services       Precautions / Restrictions Precautions Precautions: Fall Restrictions Weight Bearing Restrictions: No    Mobility  Bed Mobility Overal bed mobility: Independent                Transfers Overall transfer level: Independent                  Ambulation/Gait Ambulation/Gait assistance: Modified independent (Device/Increase time) Gait Distance (Feet): 300 Feet Assistive device: Rolling walker (2 wheeled);None Gait Pattern/deviations: Step-through pattern;Decreased stride length;Decreased weight shift to left Gait velocity: Decreased Gait velocity interpretation: 1.31 - 2.62 ft/sec, indicative of limited community ambulator General Gait Details: Initial amb with RW secondary to L foot pain, pt able to amb further without DME, although ultimately returning to use of RW secondary to L foot pain. Mod indep with amb using  RW   Stairs Stairs: (Pt declined stair training)           Wheelchair Mobility    Modified Rankin (Stroke Patients Only)       Balance Overall balance assessment: Needs assistance Sitting-balance support: No upper extremity supported Sitting balance-Leahy Scale: Good     Standing balance support: No upper extremity supported;During functional activity Standing balance-Leahy Scale: Good                              Cognition Arousal/Alertness: Awake/alert Behavior During Therapy: WFL for tasks assessed/performed Overall Cognitive Status: Within Functional Limits for tasks assessed                                        Exercises      General Comments General comments (skin integrity, edema, etc.): Reports he plans to move to "rest home" in the near future since son cannot be at home all the time to help if needed; pt seems to be looking forward to this. Asked if pt would like SW assist for this, but pt declined      Pertinent Vitals/Pain Pain Assessment: Faces Faces Pain Scale: Hurts a little bit Pain Location: L foot Pain Descriptors / Indicators: Sore Pain Intervention(s): Monitored during session    Home Living                      Prior Function  PT Goals (current goals can now be found in the care plan section) Progress towards PT goals: Goals met/education completed, patient discharged from PT    Frequency    Min 3X/week      PT Plan Current plan remains appropriate    Co-evaluation              AM-PAC PT "6 Clicks" Daily Activity  Outcome Measure  Difficulty turning over in bed (including adjusting bedclothes, sheets and blankets)?: None Difficulty moving from lying on back to sitting on the side of the bed? : None Difficulty sitting down on and standing up from a chair with arms (e.g., wheelchair, bedside commode, etc,.)?: None Help needed moving to and from a bed to chair  (including a wheelchair)?: A Little Help needed walking in hospital room?: A Little Help needed climbing 3-5 steps with a railing? : A Little 6 Click Score: 21    End of Session Equipment Utilized During Treatment: Gait belt Activity Tolerance: Patient tolerated treatment well Patient left: in bed;with call bell/phone within reach Nurse Communication: Mobility status PT Visit Diagnosis: Other abnormalities of gait and mobility (R26.89);Pain Pain - Right/Left: Left Pain - part of body: Ankle and joints of foot     Time: 0805-0819 PT Time Calculation (min) (ACUTE ONLY): 14 min  Charges:  $Gait Training: 8-22 mins                    Mabeline Caras, PT, DPT Acute Rehabilitation Services  Pager [phone] Office [place] 07/27/2018, 9:01 AM

## 2018-07-27 NOTE — Progress Notes (Signed)
Patient ID: Steve Mask., male   DOB: 1941-08-09, 77 y.o.   MRN: [id number] Comfortable this morning.  Creatinine now down to 1.79 For arteriography tomorrow to determine if there are any options for improvement of flow to his left foot.  Continuing heparin drip due to right leg DVT

## 2018-07-27 NOTE — Progress Notes (Signed)
[place] for Heparin (Apixaban on hold) Indication: History of DVT, also has hx of afib  Allergies  Allergen Reactions  . Statins Other (See Comments)    Myalgias, muscle weakness    Vital Signs: Temp: 98.7 F (37.1 C) (10/02 2136) BP: 165/68 (10/02 2136) Pulse Rate: 50 (10/02 2136)  Labs: Recent Labs    07/24/18 1723 07/24/18 1835 07/25/18 0012 07/25/18 0839  07/26/18 0019 07/26/18 0614 07/26/18 0710 07/27/18 0300  HGB 9.7*  --  9.9*  --   --   --   --  9.6* 9.0*  HCT 29.2*  --  29.5*  --   --   --   --  29.3* 27.1*  PLT 152  --  149*  --   --   --   --  150  --   APTT  --   --   --  30   < > 62* 58*  --  61*  LABPROT  --  16.1*  --   --   --   --   --   --   --   INR  --  1.30  --   --   --   --   --   --   --   HEPARINUNFRC  --   --   --  2.14*  --   --  1.18*  --  0.64  CREATININE 3.31*  --  3.15*  --   --   --  2.15*  --  1.79*  CKTOTAL  --   --  57  --   --   --   --   --   --    < > = values in this interval not displayed.    Estimated Creatinine Clearance: 33.5 mL/min (A) (by C-G formula based on SCr of 1.79 mg/dL (H)).   Medical History: Past Medical History:  Diagnosis Date  . CAD (coronary artery disease)    a. s/p CABG x 29 Nov 2011 with LIMA to LAD, SVG to OM1 and distal LCX, SVG to ramus intermediate  . Essential hypertension   . Hyperlipidemia   . Intermittent claudication ([place])   . Ischemic cardiomyopathy    a. 03/2015 Echo: EF45-50%, Gr1 DD, mild MR.  Marland Kitchen PAF (paroxysmal atrial fibrillation) ([place])    a. post op atrial fib 11/2011; short course of amiodarone; stopped 01/25/12  . Peripheral arterial disease ([place])   . Right bundle branch block   . Type II diabetes mellitus [facility])     Assessment: 77 y/o M on apixaban PTA for recent DVT, also has hx of afib, here with acute on chronic kidney disease and hyperkalemia. Holding apixaban and starting heparin. Will need to use aPTT to dose for now given apixaban  influence on heparin levels.   APTT is just below goal this morning at 61 sec.  No overt bleeding or complications noted.  Goal of Therapy:  Heparin level 0.3-0.7 units/ml aPTT 66-102 seconds Monitor platelets by anticoagulation protocol: Yes   Plan:  Increase IV heparin to 1550 units/hr Daily CBC/HL/aPTT Monitor for bleeding F/u resuming apixaban  Thanks for allowing pharmacy to be a part of this patient's care.  Excell Seltzer, PharmD Clinical Pharmacist  07/27/2018    4:45 AM

## 2018-07-27 NOTE — Progress Notes (Signed)
Notified MD oncall that pt has heparin drip infusing and pharmacy states that lactated ringers and heparin is not compatible. Attempted IV x1 unsuccessful. Pt states that nobody else is sticking him. MD stated ok to hold off on lactated ringer infusion. Will continue to monitor pt. Ranelle Oyster, RN

## 2018-07-27 NOTE — H&P (View-Only) (Signed)
Patient ID: Steve [name]., male   DOB: 1941-03-07, 77 y.o.   MRN: [id number] Comfortable this morning.  Creatinine now down to 1.79 For arteriography tomorrow to determine if there are any options for improvement of flow to his left foot.  Continuing heparin drip due to right leg DVT

## 2018-07-28 ENCOUNTER — Inpatient Hospital Stay (HOSPITAL_COMMUNITY)
Admission: EM | Disposition: A | Discharge status: Home / Self Care | Payer: Self-pay | Source: Home / Self Care | Attending: Internal Medicine

## 2018-07-28 DIAGNOSIS — I70245 Atherosclerosis of native arteries of left leg with ulceration of other part of foot: Secondary | ICD-10-CM

## 2018-07-28 HISTORY — PX: LOWER EXTREMITY ANGIOGRAM: SHX5508

## 2018-07-28 LAB — BASIC METABOLIC PANEL
ANION GAP: 9 (ref 5–15)
BUN: 20 mg/dL (ref 8–23)
CALCIUM: 8.7 mg/dL — AB (ref 8.9–10.3)
CO2: 20 mmol/L — ABNORMAL LOW (ref 22–32)
Chloride: 107 mmol/L (ref 98–111)
Creatinine, Ser: 1.71 mg/dL — ABNORMAL HIGH (ref 0.61–1.24)
GFR calc Af Amer: 42 mL/min — ABNORMAL LOW (ref >60)
GFR, EST NON AFRICAN AMERICAN: 36 mL/min — AB (ref >60)
GLUCOSE: 121 mg/dL — AB (ref 70–99)
Potassium: 4.2 mmol/L (ref 3.5–5.1)
Sodium: 136 mmol/L (ref 135–145)

## 2018-07-28 LAB — CBC
HCT: 28.6 % — ABNORMAL LOW (ref 39.0–52.0)
HEMOGLOBIN: 9.5 g/dL — AB (ref 13.0–17.0)
MCH: 30.8 pg (ref 26.0–34.0)
MCHC: 33.2 g/dL (ref 30.0–36.0)
MCV: 92.9 fL (ref 78.0–100.0)
Platelets: 126 10*3/uL — ABNORMAL LOW (ref 150–400)
RBC: 3.08 MIL/uL — ABNORMAL LOW (ref 4.22–5.81)
RDW: 15.4 % (ref 11.5–15.5)
WBC: 5.4 10*3/uL (ref 4.0–10.5)

## 2018-07-28 LAB — POCT ACTIVATED CLOTTING TIME: Activated Clotting Time: 120 seconds

## 2018-07-28 LAB — APTT: APTT: 43 s — AB (ref 24–36)

## 2018-07-28 LAB — GLUCOSE, CAPILLARY
GLUCOSE-CAPILLARY: 111 mg/dL — AB (ref 70–99)
Glucose-Capillary: 95 mg/dL (ref 70–99)
Glucose-Capillary: 106 mg/dL — ABNORMAL HIGH (ref 70–99)
Glucose-Capillary: 124 mg/dL — ABNORMAL HIGH (ref 70–99)

## 2018-07-28 LAB — HEPARIN LEVEL (UNFRACTIONATED): HEPARIN UNFRACTIONATED: 0.33 [IU]/mL (ref 0.30–0.70)

## 2018-07-28 SURGERY — ABDOMINAL AORTAGRAM
Anesthesia: LOCAL

## 2018-07-28 MED ORDER — LABETALOL HCL 5 MG/ML IV SOLN
10.0000 mg | INTRAVENOUS | Status: DC | PRN
  Administered 2018-07-28 (×2): 10 mg via INTRAVENOUS
  Filled 2018-07-28 (×2): qty 4

## 2018-07-28 MED ORDER — HYDRALAZINE HCL 20 MG/ML IJ SOLN: 10.0000 mg | INTRAMUSCULAR | Status: DC | PRN

## 2018-07-28 MED ORDER — HEPARIN (PORCINE) IN NACL 1000-0.9 UT/500ML-% IV SOLN
INTRAVENOUS | Status: AC
  Filled 2018-07-28: qty 1000

## 2018-07-28 MED ORDER — MIDAZOLAM HCL 2 MG/2ML IJ SOLN
INTRAMUSCULAR | Status: DC | PRN
  Administered 2018-07-28: 1 mg via INTRAVENOUS

## 2018-07-28 MED ORDER — ACETAMINOPHEN 325 MG PO TABS
650.0000 mg | ORAL_TABLET | ORAL | Status: DC | PRN
  Administered 2018-07-28: 650 mg via ORAL
  Filled 2018-07-28: qty 2

## 2018-07-28 MED ORDER — SODIUM CHLORIDE 0.9 % WEIGHT BASED INFUSION: 1.0000 mL/kg/h | INTRAVENOUS | Status: AC

## 2018-07-28 MED ORDER — HYDRALAZINE HCL 20 MG/ML IJ SOLN
INTRAMUSCULAR | Status: DC | PRN
  Administered 2018-07-28 (×2): 10 mg via INTRAVENOUS

## 2018-07-28 MED ORDER — SODIUM CHLORIDE 0.9% FLUSH
3.0000 mL | Freq: Two times a day (BID) | INTRAVENOUS | Status: DC
  Administered 2018-07-29: 3 mL via INTRAVENOUS

## 2018-07-28 MED ORDER — HEPARIN (PORCINE) IN NACL 100-0.45 UNIT/ML-% IJ SOLN
1550.0000 [IU]/h | INTRAMUSCULAR | Status: DC
  Administered 2018-07-28: 1550 [IU]/h via INTRAVENOUS
  Filled 2018-07-28 (×2): qty 250

## 2018-07-28 MED ORDER — FENTANYL CITRATE (PF) 100 MCG/2ML IJ SOLN
INTRAMUSCULAR | Status: AC
  Filled 2018-07-28: qty 2

## 2018-07-28 MED ORDER — IODIXANOL 320 MG/ML IV SOLN
INTRAVENOUS | Status: DC | PRN
  Administered 2018-07-28: 8 mL via INTRA_ARTERIAL

## 2018-07-28 MED ORDER — SODIUM CHLORIDE 0.9% FLUSH: 3.0000 mL | INTRAVENOUS | Status: DC | PRN

## 2018-07-28 MED ORDER — HYDRALAZINE HCL 20 MG/ML IJ SOLN
INTRAMUSCULAR | Status: AC
  Filled 2018-07-28: qty 1

## 2018-07-28 MED ORDER — HYDRALAZINE HCL 20 MG/ML IJ SOLN
5.0000 mg | INTRAMUSCULAR | Status: DC | PRN
  Administered 2018-07-28: 5 mg via INTRAVENOUS
  Filled 2018-07-28: qty 1

## 2018-07-28 MED ORDER — SODIUM CHLORIDE 0.9 % IV SOLN: 250.0000 mL | INTRAVENOUS | Status: DC | PRN

## 2018-07-28 MED ORDER — ONDANSETRON HCL 4 MG/2ML IJ SOLN: 4.0000 mg | Freq: Four times a day (QID) | INTRAMUSCULAR | Status: DC | PRN

## 2018-07-28 MED ORDER — MIDAZOLAM HCL 2 MG/2ML IJ SOLN
INTRAMUSCULAR | Status: AC
  Filled 2018-07-28: qty 2

## 2018-07-28 MED ORDER — LIDOCAINE HCL (PF) 1 % IJ SOLN
INTRAMUSCULAR | Status: DC | PRN
  Administered 2018-07-28: 10 mL

## 2018-07-28 MED ORDER — LIDOCAINE HCL (PF) 1 % IJ SOLN
INTRAMUSCULAR | Status: AC
  Filled 2018-07-28: qty 30

## 2018-07-28 MED ORDER — FENTANYL CITRATE (PF) 100 MCG/2ML IJ SOLN
INTRAMUSCULAR | Status: DC | PRN
  Administered 2018-07-28: 50 ug via INTRAVENOUS

## 2018-07-28 SURGICAL SUPPLY — 19 items
CATH ANGIO 5F PIGTAIL 65CM (CATHETERS) ×1 IMPLANT
CATH CROSS OVER TEMPO 5F (CATHETERS) ×1 IMPLANT
CATH QUICKCROSS SUPP .035X90CM (MICROCATHETER) ×1 IMPLANT
CATH SOFT-VU 4F 65 STRAIGHT (CATHETERS) IMPLANT
CATH SOFT-VU STRAIGHT 4F 65CM (CATHETERS) ×3
CATH STRAIGHT 5FR 65CM (CATHETERS) ×1 IMPLANT
DEVICE TORQUE .025-.038 (MISCELLANEOUS) ×1 IMPLANT
FILTER CO2 0.2 MICRON (VASCULAR PRODUCTS) ×1 IMPLANT
GUIDEWIRE ANGLED .035X150CM (WIRE) ×1 IMPLANT
KIT MICROPUNCTURE NIT STIFF (SHEATH) ×1 IMPLANT
KIT PV (KITS) ×3 IMPLANT
RESERVOIR CO2 (VASCULAR PRODUCTS) ×1 IMPLANT
SET FLUSH CO2 (MISCELLANEOUS) ×1 IMPLANT
SHEATH PINNACLE 5F 10CM (SHEATH) ×1 IMPLANT
SYR MEDRAD MARK V 150ML (SYRINGE) ×3 IMPLANT
TRANSDUCER W/STOPCOCK (MISCELLANEOUS) ×3 IMPLANT
TRAY PV CATH (CUSTOM PROCEDURE TRAY) ×3 IMPLANT
WIRE HITORQ VERSACORE ST 145CM (WIRE) ×1 IMPLANT
WIRE ROSEN-J .035X260CM (WIRE) ×1 IMPLANT

## 2018-07-28 NOTE — Progress Notes (Signed)
[place] for Heparin (Apixaban on hold) Indication: History of DVT, also has hx of afib  Allergies  Allergen Reactions  . Statins Other (See Comments)    Myalgias, muscle weakness    Vital Signs: Temp: 98.2 F (36.8 C) (10/04 1714) Temp Source: Oral (10/04 1714) BP: 171/55 (10/04 1714) Pulse Rate: 70 (10/04 1701)  Labs: Recent Labs    07/26/18 0614  07/26/18 0710 07/27/18 0300 07/28/18 0608  HGB  --    < > 9.6* 9.0* 9.5*  HCT  --   --  29.3* 27.1* 28.6*  PLT  --   --  150  --  126*  APTT 58*  --   --  61* 43*  HEPARINUNFRC 1.18*  --   --  0.64 0.33  CREATININE 2.15*  --   --  1.79* 1.71*   < > = values in this interval not displayed.    Estimated Creatinine Clearance: 38.2 mL/min (A) (by C-G formula based on SCr of 1.71 mg/dL (H)).   Assessment: 77 y/o M on apixaban PTA for recent DVT, also has hx of afib, here with acute on chronic kidney disease and hyperkalemia. Apixaban on hold. He is s/p LLE arteriogram. Pharmacy consulted to resume heparin drip 8 hrs post sheath removal. Sheath out ~16:45, no bleeding noted.  Goal of Therapy:  Heparin level 0.3-0.7 units/ml Monitor platelets by anticoagulation protocol: Yes   Plan:  Resume heparin drip at 1550 units/hr with no bolus on 10/5 at 00:45 8 hr heparin level Daily CBC, heparin level Monitor for bleeding F/u resuming apixaban    Renold Genta, PharmD, BCPS Clinical Pharmacist Clinical phone for 07/28/2018 until 10p is [phone] Please check AMION for all Pharmacist numbers by unit 07/28/2018 5:37 PM

## 2018-07-28 NOTE — Progress Notes (Signed)
Subjective: This morning, Steve [name] reports that he is feeling very well and that this is the best his left foot has felt. He continues to not need any Tylenol for pain. He is anxious to have his arteriogram today.  Of note, he explained that used to take amlodipine until ~3 months ago when he stopped due to swelling in his lower extremities. He would like to avoid taking amlodipine.  Objective: Vital signs in last 24 hours: Vitals:   07/27/18 2148 07/28/18 0501 07/28/18 0501 07/28/18 1334  BP: (!) 156/61  (!) 178/67 (!) 181/63  Pulse: (!) 53  (!) 53 (!) 50  Resp: 19  19   Temp: 98.2 F (36.8 C)  98.3 F (36.8 C) 97.9 F (36.6 C)  TempSrc: Oral  Oral Oral  SpO2: 97%  97% 98%  Weight:  86.7 kg    Height:        Intake/Output Summary (Last 24 hours) at 07/28/2018 1435 Last data filed at 07/28/2018 1041 Gross per 24 hour  Intake 426 ml  Output 950 ml  Net -524 ml   General appearance: alert, cooperative, appears stated age and no distress Lungs: clear to auscultation bilaterally Heart: bradycardic, regular rhythm; hypertensive; dorsal pedal pulses diminished but palpable Abdomen: soft, non-tender; bowel sounds normal; no masses,  no organomegaly Extremities: L foot mildly erythematous (decreased from yesterday); scabbing present on shin; metatarsals shiny and erythematous; plantar surface of left toes with unstageable dry ulcer  Mood and affect are normal. Has normal thought content   Lab Results: Hgb 9.5  BUN 20 Cr 1.71 K 4.2 Bicarb 20  Micro Results: Blood cultures from 09/30 - no growth at 4 days  Studies/Results: No results found.   Medications: I have reviewed the patient's current medications. Scheduled Meds: . cephALEXin  500 mg Oral Q8H  . metoprolol tartrate  100 mg Oral Daily  . sodium chloride flush  3 mL Intravenous Q12H   Continuous Infusions: . 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 His bicarb remains stable at 20.  - Continue to hold indomethacin, allopurinol, and cilostazol. Plan to restart cilostazol upon discharge. - Hold NSAIDs.  LLE Cellulitis: This morning, it appears that the erythema on his left foot continues to decrease. He will continue on po cephalexin 500 mg TID until 10/06.    PAD: Arteriogram performed today: Findings: 1. Markedly calcified aorta and common iliac arteries and external iliac arteries but no focal stenosis. 2.  Markedly calcified common femoral and superficial femoral artery. 3.  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  CLINICAL NOTE: Based on the arteriogram there are no options for revascularization in the  left lower extremity.   Chronic DVT:  Held heparin drip held during arteriogram. Will resume 8 h post arteriogram per vascular surgery. Pharmacy following. Plan to transition back to Eliquis at discharge with close outpatient follow-up.   HTN: Pt has been hypertensive since arrival, with SBPs ranging from 140-180s.We will not start him on amlodipine for further BP control, as he has had previous leg swelling when on amlodipine. We  will continue him on metoprolol while in the hospital and plan to restart his lisinopril upon discharge.  Iron Deficiency Anemia with acute thrombocytopenia: Steve [name] is followed by hematology for IDA despite iron supplementation; he is on ferrous iron qd at home. His anemia could be a result of worsening renal function or slow GI bleed (due to his recent black stools).Will recommend that he have an outpatient colonoscopyand continue following-up with his PCP.  Type 2 Diabetes: Continue holding metformin. His blood sugars have been mildly elevated throughout his hospital stay.  DHx of depressed mood: Steve [name] reported suicidal ideation yesterday morning (10/03). Today has normal mood and affect. Today he does not express any suicidal ideations.  -Monitor and follow up at out patient  Diet: regular diet after procedure DVT ppx: heparin drip [id number] Code: full Dispo: Arteriogram today  This is a Careers information officer Note.  The care of the patient was discussed with Dr. Myrtie Hawk and the assessment and plan formulated with their assistance.  Please see their attached note for official documentation of the daily encounter.   LOS: 4 days   Dimas Millin, Medical Student 07/28/2018, 3:04 PM   I have seen and examined the patient, and reviewed the daily progress note by Nena Polio, MS and discussed the care of the patient with them.   Signed:  Dewayne Hatch, MD 07/28/2018, 6:30 PM

## 2018-07-28 NOTE — Op Note (Signed)
   PATIENT: Steve [name].      MRN: [id number] DOB: 05-21-1941    DATE OF PROCEDURE: 07/28/2018  INDICATIONS:    [name]. is a 77 y.o. male was seen in consultation by Dr. Sherren Mocha Early with wounds on the left foot.  He was set up for an arteriogram to evaluate his options for revascularization.  PROCEDURE:    1.  Conscious sedation 2.  Ultrasound-guided access to the right common femoral artery 3.  Aortogram with CO2 with bilateral iliac arteriogram 4.  Selective catheterization of the left external iliac artery with left lower extremity runoff using CO 2 and a total of 8 cc of contrast  SURGEON: Judeth Cornfield. Scot Dock, MD, FACS  ANESTHESIA: Local with sedation  EBL: Minimal  TECHNIQUE: The patient was brought to the peripheral vascular lab and was sedated. The period of conscious sedation was 45 minutes.  During that time period, I was present face-to-face 100% of the time.  The patient was administered 1 mg of Versed and 50 mcg of fentanyl. The patient's heart rate, blood pressure, and oxygen saturation were monitored by the nurse continuously during the procedure.  Both groins were prepped and draped in the usual sterile fashion.  Under ultrasound guidance, after the skin was anesthetized, I cannulated the right common femoral artery with a micropuncture needle and a micropuncture sheath was introduced over a wire.  This was exchanged for a 5 [place] sheath over a Bentson wire.  By ultrasound the femoral artery was patent. A real-time image was obtained and placed in the chart.   A pigtail catheter was positioned at the L1 vertebral body and CO2 arch aortogram was obtained.  The catheter was positioned above the aortic bifurcation and this was exchanged for a crossover catheter which was positioned into the left common iliac artery.  Of note the aorta and iliac arteries were markedly calcified.  I was able to get an angled Glidewire down to the external iliac artery on the left.  I  then used a quick cross catheter and was able to advance this down into the external iliac artery.  We then used a Rosen wire for better support and were able to advance the quick cross catheter lower down in the external iliac artery.  CO2 arteriogram was obtained of the left lower extremity.  In order to get better visualization distally we did 1 picture with 8 cc of contrast of the lower leg.  The patient was transferred to the holding area for move of the sheath.  No immediate complications were noted.  FINDINGS:   1.  Markedly calcified aorta and common iliac arteries and external iliac arteries but no focal stenosis. 2.  Markedly calcified common femoral and superficial femoral artery. 3.  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  CLINICAL NOTE: Based on the arteriogram there are no options for revascularization in the left lower extremity.  [name], MD, FACS [facility]  DATE OF DICTATION:   07/28/2018

## 2018-07-28 NOTE — Progress Notes (Signed)
Internal Medicine Attending:   I saw and examined the patient. I reviewed the resident's note and I agree with the resident's findings and plan as documented in the resident's note.   Patient continues to report improvement of pain and left foot, serum creatinine appears to be stabilizing.  He underwent aortogram today unfortunately it does not appear that he has any sites for revascularization.  Given this will need to touch base with vascular surgery tomorrow to see if amputation should be considered.

## 2018-07-28 NOTE — Progress Notes (Signed)
Order for sheath removal verified per post procedural orders. Procedure explained to patient and Rt femoral artery access site assessed: level 0,bilateral doppler dorsalis pedis and posterior tibial pulses. 5French Sheath removed and manual pressure applied for 25 minutes. Pre, peri, & post procedural vitals: HR 70, RR 18-25, O2 Sat upper 98-100, BP 155-170/80, Pain 0 from groin site,bottom lt foot and toe was a 5 out of 10. Distal pulses remained intact after sheath removal. Access site level 0 and dressed with 4X4 gauze and tegaderm.  Clement Husbands, RN confirmed condition of site. Post procedural instructions discussed and return demonstration from patient. Bedrest starts  1645

## 2018-07-28 NOTE — Progress Notes (Signed)
Patient arrived to 4E room 15 at this time. Telemetry applied and v/s done. CCMD notified. CHG bath done. Patient oriented to room and how to call nurse with any needs. Patient instructed that he is on bedrest until 2045. Patient verbalized understanding. No needs expressed at this time.  Emelda Fear, RN

## 2018-07-28 NOTE — Interval H&P Note (Signed)
History and Physical Interval Note:  07/28/2018 2:58 PM  Steve [name].  has presented today for surgery, with the diagnosis of claudication  The various methods of treatment have been discussed with the patient and family. After consideration of risks, benefits and other options for treatment, the patient has consented to  Procedure(s): ARTERIAL ANGIOGRAPHY (N/A) as a surgical intervention .  The patient's history has been reviewed, patient examined, no change in status, stable for surgery.  I have reviewed the patient's chart and labs.  Questions were answered to the patient's satisfaction.     [name]

## 2018-07-28 NOTE — Progress Notes (Addendum)
[place] for Heparin (Apixaban on hold) Indication: History of DVT, also has hx of afib  Allergies  Allergen Reactions  . Statins Other (See Comments)    Myalgias, muscle weakness    Vital Signs: Temp: 98.3 F (36.8 C) (10/04 0501) Temp Source: Oral (10/04 0501) BP: 178/67 (10/04 0501) Pulse Rate: 53 (10/04 0501)  Labs: Recent Labs    07/26/18 0614  07/26/18 0710 07/27/18 0300 07/28/18 0608  HGB  --    < > 9.6* 9.0* 9.5*  HCT  --   --  29.3* 27.1* 28.6*  PLT  --   --  150  --  126*  APTT 58*  --   --  61* 43*  HEPARINUNFRC 1.18*  --   --  0.64 0.33  CREATININE 2.15*  --   --  1.79* 1.71*   < > = values in this interval not displayed.    Estimated Creatinine Clearance: 38.2 mL/min (A) (by C-G formula based on SCr of 1.71 mg/dL (H)).   Medical History: Past Medical History:  Diagnosis Date  . CAD (coronary artery disease)    a. s/p CABG x 29 Nov 2011 with LIMA to LAD, SVG to OM1 and distal LCX, SVG to ramus intermediate  . Essential hypertension   . Hyperlipidemia   . Intermittent claudication ([place])   . Ischemic cardiomyopathy    a. 03/2015 Echo: EF45-50%, Gr1 DD, mild MR.  Marland Kitchen PAF (paroxysmal atrial fibrillation) ([place])    a. post op atrial fib 11/2011; short course of amiodarone; stopped 01/25/12  . Peripheral arterial disease ([place])   . Right bundle branch block   . Type II diabetes mellitus [facility])     Assessment: 77 y/o M on apixaban PTA for recent DVT, also has hx of afib, here with acute on chronic kidney disease and hyperkalemia. Holding apixaban and starting heparin. Was using aPTT to dose given apixaban influence on heparin levels, but has been off apixaban for ~5 days, so will now use heparin levels  heparin level therapeutic 0.33.  No overt bleeding or complications noted. Arteriogram of LLE planned for today.  Goal of Therapy:  Heparin level 0.3-0.7 units/ml aPTT 66-102 seconds Monitor platelets by anticoagulation  protocol: Yes   Plan:  IV heparin to 1550 units/hr Daily CBC/ heparin level Monitor for bleeding F/u resuming apixaban or heparin after procedure today  Harrietta Guardian, PharmD PGY1 Pharmacy Resident 07/28/2018    10:39 AM

## 2018-07-28 NOTE — Progress Notes (Signed)
Paged Lee,MD with IM teaching services about continuing the heparin drip in anticipation of the patient's planned arteriogram for today.  MD returned page and said to continue infusing heparin drip at this time since we do not know the exact time the patient will be going for his procedure. MD also said patient could have his morning PO dose of Keflex with water even though he is strictly NPO. Will continue to monitor and treat per MD orders.

## 2018-07-28 NOTE — Discharge Summary (Signed)
Name: Steve [name]. MRN: [id number] DOB: 07-Mar-1941 77 y.o. PCP: Leonard Downing, MD  Date of Admission: 07/24/2018  5:02 PM Date of Discharge: 07/29/2018 Attending Physician: Lucious Groves, DO  Discharge Diagnosis: 1. Acute on Chronic Kidney Disease 2. Left Lower Extremity Cellulitis  3. Peripheral Artery Disease  Discharge Medications: Allergies as of 07/29/2018      Reactions   Statins Other (See Comments)   Myalgias, muscle weakness      Medication List    STOP taking these medications   indomethacin 50 MG capsule Commonly known as:  INDOCIN   lisinopril 40 MG tablet Commonly known as:  PRINIVIL,ZESTRIL   metoprolol tartrate 100 MG tablet Commonly known as:  LOPRESSOR     TAKE these medications   allopurinol 100 MG tablet Commonly known as:  ZYLOPRIM Take 100 mg by mouth daily with breakfast. To prevent gout   apixaban 5 MG Tabs tablet Commonly known as:  ELIQUIS Take 1 tablet (5 mg total) by mouth 2 (two) times daily. What changed:  when to take this   cephALEXin 500 MG capsule Commonly known as:  KEFLEX Take 1 capsule (500 mg total) by mouth every 8 (eight) hours for 2 days.   cilostazol 100 MG tablet Commonly known as:  PLETAL TAKE 1 TABLET TWICE A DAY   ferrous sulfate 325 (65 FE) MG tablet Take 325 mg by mouth daily with breakfast.   metFORMIN 1000 MG tablet Commonly known as:  GLUCOPHAGE Take 500 mg by mouth 2 (two) times daily with a meal.   vitamin B-12 500 MCG tablet Commonly known as:  CYANOCOBALAMIN Take 500 mcg by mouth daily.      Disposition and follow-up:   Mr.[name] Mijangos Jr. was discharged from [facility] in Stable condition.  At the hospital follow up visit please address:  1. AKI. Please recheck the patient's renal function within one week of discharge. Discussed restarting lisinopril. Peripheral artery disease. Please ensure the patient is scheduled to follow-up with Dr. Scot Dock of vascular  surgery.  2.  Labs / imaging needed at time of follow-up: Colonoscopy. BMP  3.  Pending labs/ test needing follow-up: none  Follow-up Appointments: Follow-up Information    Leonard Downing, MD. Schedule an appointment as soon as possible for a visit.   Specialty:  Family Medicine Contact information: [place] [address] [phone]        Angelia Mould, MD. Schedule an appointment as soon as possible for a visit.   Specialties:  Vascular Surgery, Cardiology Contact information: [place] [address] [place] by problem list: 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   1. 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 presented to the ED on 09/30 in his normal state of health with abnormal bloodwork by recommendation of his hematologist. He had lower extremity edema, hyperkalemia, elevated creatinine, metabolic acidosis, and anemia at presentation, significant for acute renal failure. He has had ~3 months of urinary symptoms. However, his bladder scans and renal US were normal, showing no hydronephrosis or mass with normal echogenicity, ruling out post-renal failure. His urinalysis  returned normal, making intrarenal a less likely cause. We suspected that the most likely cause of acute renal failure was Indomethacin, which he was started on two weeks ago for suspected gout. Therefore, his indomethacin and allopurinol were held. He was also started on sodium bicarb 650 mg BID for the metabolic acidosis. On 10/01, his BUN and Cr began to improve, and they continued to downtrend throughout his stay. His bicarb also improved. At discharge, his BUN was 20 and  Cr was 1.86. Please recheck a BMP in 1 week.  2. LLE Cellulitis: [name] was treated two weeks ago for gout of his LLE, with allopurinol and indomethacin. At presentation on 09/30, his LLE was erythematous up to mid calf, metatarsals were shiny, with scabbing and non-healing wounds on the plantar surface of his toes 2-5. It was suspected that this was actually cellulitis, rather than gout, and he was started on ancef for 2 days. His LLE became less erythematous each day, with only mild erythema on his left foot remaining. His blood cultures showed no growth at  4 days. He was switched to oral keflex for four days, to complete a five day course of antibiotics. We advise close follow-up with his PCP or with a clinic visit.  3. PAD: [name] has a known history of PAD, with ongoing pain in his left foot and left toes and the non-healing ulcers. ABI/TBI revealed mild arterial disease in his right lower extremity and moderate arterial disease in his left lower extremity. The ABI/TBI in his left lower extremity has worsened since that measured last year. We consulted Vascular, who performed an arteriogram on 10/04 which illustrated no options for revascularization. His arterial wounds were without discharge and vascular surgery recommended outpatient follow-up.   4. Iron Deficiency Anemia with acute thrombocytopenia: [name] has a history of iron deficiency anemia and is on ferrous iron daily. He sees a hematologist due to iron deficiency anemia despite supplementation. His Hgb normally ranges between 11.5-12.9, but was 9.9 at presentation on 09/30. He was also found to have thrombocytopenia. We advise that he follow-up with his PCP and plan for an outpatient colonoscopy to assess for bleeding in the GI tract.   5. Chronic DVT: [name] has a recent history of RLE DVT with unclear etiology, and is on eliquis BID. He has had frequent nosebleeds since starting eliquis. PT/INR, aPTT, and fibrinogen were  monitored. We held his eliquis and started him on heparin drip while he was in the hospital. At discharge, he was restarted on his eliquis.    6. Hypertension: We held [name] lisinopril due to acute renal failure. During his stay, he was on metoprolol for blood pressure control, though he remained mildly hypertensive. However, on discharge both his metoprolol and lisinopril were held due to AKI and bradycardia. Please reassess and restart these medications if appropriate.   Discharge Vitals:   BP (!) 151/61 (BP Location: Left Arm)   Pulse (!) 58   Temp 99.1 F (37.3 C) (Oral)   Resp (!) 22   Ht 5\' 9"  (1.753 m)   Wt 86.3 kg Comment: Scale A  SpO2 95%   BMI 28.10 kg/m   Pertinent Labs, Studies, and Procedures:  CXR 09/30 - Impression: normal Renal US 09/30 - Impression: R kidney: No mass or hydronephrosis, normal echogenicity. L kidney: no mass or hydronephrosis, normal echogenicity. Bladder normal.  Left food xray 09/30 - Impression: no acute osseus abnormalities seen.   ABI 10/01:  -  Right ABI/TBI: 0.84/0.46 - Left ABI/TBI: 0.66/0.23  Blood cultures negative at 4 days.  Discharge Instructions: Discharge Instructions    Diet - low sodium heart healthy   Complete by:  As directed    Discharge instructions   Complete by:  As directed    Please make an appointment with your primary care doctor to discuss resuming your metoprolol and lisinopril. Resume all other medications as prescribed, except the indomethacin.   Increase activity slowly   Complete by:  As directed      Mr. Maler, it was our pleasure to take care of you during your hospital stay. While you were here, we treated you for acute renal failure (your elevated kidney numbers) and for a cellulitis infection on your left leg and foot. We determined that the gout that you had been diagnosed with was actually cellulitis. You will no longer need to take Indomethacin, because this medication is for gout. Also, we would like  you to stop taking Indomethacin, because this is what caused your kidney problems.   For the cellulitis infection on your left foot, you will need to complete your course of antibiotics. Your antibiotic is Keflex (cephalexin) 500 mg. Please take this three times per day until you run out on October 6th.   Otherwise, we are restarting you on your home medications. Please follow-up with your primary care provider for close follow-up on your kidneys and anemia.   Signed: Ina Homes, MD 07/29/2018, 11:57 AM

## 2018-07-29 DIAGNOSIS — I772 Rupture of artery: Secondary | ICD-10-CM

## 2018-07-29 DIAGNOSIS — E872 Acidosis: Secondary | ICD-10-CM

## 2018-07-29 DIAGNOSIS — Z862 Personal history of diseases of the blood and blood-forming organs and certain disorders involving the immune mechanism: Secondary | ICD-10-CM

## 2018-07-29 DIAGNOSIS — I739 Peripheral vascular disease, unspecified: Secondary | ICD-10-CM

## 2018-07-29 LAB — CBC
HEMATOCRIT: 27.5 % — AB (ref 39.0–52.0)
HEMOGLOBIN: 9.3 g/dL — AB (ref 13.0–17.0)
MCH: 31.4 pg (ref 26.0–34.0)
MCHC: 33.8 g/dL (ref 30.0–36.0)
MCV: 92.9 fL (ref 78.0–100.0)
Platelets: 100 10*3/uL — ABNORMAL LOW (ref 150–400)
RBC: 2.96 MIL/uL — ABNORMAL LOW (ref 4.22–5.81)
RDW: 15.6 % — AB (ref 11.5–15.5)
WBC: 5.4 10*3/uL (ref 4.0–10.5)

## 2018-07-29 LAB — BASIC METABOLIC PANEL
Anion gap: 10 (ref 5–15)
BUN: 20 mg/dL (ref 8–23)
CHLORIDE: 107 mmol/L (ref 98–111)
CO2: 17 mmol/L — AB (ref 22–32)
CREATININE: 1.86 mg/dL — AB (ref 0.61–1.24)
Calcium: 8.2 mg/dL — ABNORMAL LOW (ref 8.9–10.3)
GFR calc Af Amer: 38 mL/min — ABNORMAL LOW (ref >60)
GFR, EST NON AFRICAN AMERICAN: 33 mL/min — AB (ref >60)
GLUCOSE: 147 mg/dL — AB (ref 70–99)
Potassium: 4 mmol/L (ref 3.5–5.1)
Sodium: 134 mmol/L — ABNORMAL LOW (ref 135–145)

## 2018-07-29 LAB — CULTURE, BLOOD (ROUTINE X 2)
CULTURE: NO GROWTH
Culture: NO GROWTH
Special Requests: ADEQUATE

## 2018-07-29 LAB — GLUCOSE, CAPILLARY
GLUCOSE-CAPILLARY: 104 mg/dL — AB (ref 70–99)
GLUCOSE-CAPILLARY: 142 mg/dL — AB (ref 70–99)

## 2018-07-29 LAB — HEPARIN LEVEL (UNFRACTIONATED): HEPARIN UNFRACTIONATED: 0.36 [IU]/mL (ref 0.30–0.70)

## 2018-07-29 MED ORDER — APIXABAN 5 MG PO TABS
5.0000 mg | ORAL_TABLET | Freq: Two times a day (BID) | ORAL | Status: DC
  Administered 2018-07-29: 5 mg via ORAL
  Filled 2018-07-29: qty 1

## 2018-07-29 MED ORDER — APIXABAN 5 MG PO TABS: 5.0000 mg | ORAL_TABLET | Freq: Two times a day (BID) | ORAL | 0 refills | Status: DC

## 2018-07-29 MED ORDER — CEPHALEXIN 500 MG PO CAPS: 500.0000 mg | ORAL_CAPSULE | Freq: Three times a day (TID) | ORAL | 0 refills | Status: AC

## 2018-07-29 NOTE — Progress Notes (Signed)
Subjective: Patient doing well this AM. He is anxious to go home. He spoke with vascular surgery today and they informed him that there are no options for revascularization. He will follow-up with them as an outpatient. He is tolerating PO intake without issue and ambulating without difficulty. Plans to move into a ALF after discharge. We discussed the plan to switch him back to Eliquis prior to DC today.  We also discussed the patient's SI he expressed 2-3 days prior. He says that it is no longer an option and that life is too precious. He has 4 grandchildren, a son, and a daughter in law that he wants to live for. He does not want to talk to a psychologist. He does not have access to guns and has never tried to harm himself in the past.   Objective: Vital signs in last 24 hours: Vitals:   07/28/18 2300 07/29/18 0000 07/29/18 0400 07/29/18 0540  BP: (!) 147/52 (!) 133/48 (!) 169/59   Pulse:      Resp: 19     Temp:      TempSrc:      SpO2: 97% 95%    Weight:    86.3 kg  Height:       General: Well nourished male in no acute distress Pulm: Good air movement with no wheezing or crackles  CV: Regular rhythm, bradycardia, no murmurs, no rubs  Extremities: LLE erythema and swelling significantly improved, stable arterial ulcers on the LLE without discharge or drainage.  Skin: Warm and dry  Neuro: Alert and oriented x 3  Assessment/Plan:  Steve [name] is a 77 year old male with HFrEF, CAD status post CABG 2013, PAD, CVA, hypertension, and recent right lower extremity DVT who presented to the emergency department with acute on chronic kidney disease secondary to medication use (Indomethacin). Subsequently admitted for further evaluation. During his admission he was noted to have arterial ulcers on the left lower extremity and vascular surgery was consulted.  Left lower extremity arterial ulcers Severe peripheral artery disease - Patient's foot pain remains stable without acute exacerbation    - Arterial ulcers on the left second through fourth toes appear dry without discharge - Underwent arteriogram with vascular surgery yesterday was found to have no good points for bypass or revascularization. - Vascular will follow-up with the patient outpatient.  - Transition back to Eliquis today   AKI on CKD. - Since admission the patient's renal function has began to improve and trend back towards baseline. - He continues to have a non-anion gap metabolic acidosis with bicarbonate 20. We will continue to monitor. Suspect this will improve as his renal function continues to improve. - Will need repeat labs in 1 to 2 weeks after discharge.  Left lower extremity non-purulent cellulitis - Patient's erythema and pain continue to improve - BC from 9/30 with no growth to date  - Continue cephalexin 500 mg TID until 10/6  DM - CBGs at goal, <180   Hypertension  - BP remains above goal  - Continuing metoprolol  - Will restart Lisinopril on discharge as long as renal function continues to improve   RLE DVT  - Previously on Eliquis  - Continuing IV heparin while in the hospital with plans to transition back to Eliquis on DC  Depression / SI - Long discussion with patient today. Good support system at home.  - No immediate threat to himself, okay for discharge  Dispo: Anticipated discharge today once transitioned back to Eliquis.  Steve Homes, MD 07/29/2018, 5:45 AM

## 2018-07-29 NOTE — Discharge Instructions (Signed)
Steve [name], it was our pleasure to take care of you during your hospital stay. While you were here, we treated you for acute renal failure (your elevated kidney numbers) and for a cellulitis infection on your left leg and foot. We determined that the gout that you had been diagnosed with was actually cellulitis. You will no longer need to take Indomethacin, because this medication is for gout. Also, we would like you to stop taking Indomethacin, because this is what caused your kidney problems.   For the cellulitis infection on your left foot, you will need to complete your course of antibiotics. Your antibiotic is Keflex (cephalexin) 500 mg. Please take this three times per day until you run out on October 6th.   Otherwise, we are restarting you on your home medications. Please follow-up with your primary care provider for close follow-up on your kidneys and anemia.   Please feel free to call if you have any questions or concerns

## 2018-07-29 NOTE — Progress Notes (Signed)
[facility] for Heparin to apixaban Indication: History of DVT, also has hx of afib  Allergies  Allergen Reactions  . Statins Other (See Comments)    Myalgias, muscle weakness    Vital Signs: BP: 169/59 (10/05 0400)  Labs: Recent Labs    07/27/18 0300 07/28/18 0608  HGB 9.0* 9.5*  HCT 27.1* 28.6*  PLT  --  126*  APTT 61* 43*  HEPARINUNFRC 0.64 0.33  CREATININE 1.79* 1.71*    Estimated Creatinine Clearance: 38.1 mL/min (A) (by C-G formula based on SCr of 1.71 mg/dL (H)).   Medical History: Past Medical History:  Diagnosis Date  . CAD (coronary artery disease)    a. s/p CABG x 29 Nov 2011 with LIMA to LAD, SVG to OM1 and distal LCX, SVG to ramus intermediate  . Essential hypertension   . Hyperlipidemia   . Intermittent claudication ([place])   . Ischemic cardiomyopathy    a. 03/2015 Echo: EF45-50%, Gr1 DD, mild MR.  Marland Kitchen PAF (paroxysmal atrial fibrillation) ([place])    a. post op atrial fib 11/2011; short course of amiodarone; stopped 01/25/12  . Peripheral arterial disease ([place])   . Right bundle branch block   . Type II diabetes mellitus [facility])     Assessment: 77 y/o M on apixaban PTA for recent DVT, also has hx of afib, here with acute on chronic kidney disease and hyperkalemia. Holding apixaban and starting heparin. Was using aPTT to dose given apixaban influence on heparin levels, but has been off apixaban for ~5 days, so will now use heparin levels  Pt now s/p arteriogram with no revascularization options. Pharmacy consulted to transition back to apixaban.  Goal of Therapy:  Heparin level 0.3-0.7 units/ml aPTT 66-102 seconds Monitor platelets by anticoagulation protocol: Yes   Plan:  -Stop IV heparin -Resume apixaban 5mg  BID -Pharmacy will sign off, reconsult as needed  Arrie Senate, PharmD, BCPS Clinical Pharmacist [phone] Please check AMION for all [place] numbers 07/29/2018

## 2018-07-29 NOTE — Progress Notes (Signed)
Patient is ready for discharge. He has had all of his questions regarding his discharge answered. He has all of his belongings. Patient has been taken off telemetry and CCMD has been notified. IV has been removed without complication catheter intact and patient tolerated well. Patient will be transported home by his son who is here with him. He will leave unit by wheelchair and meet his son at the front entrance.Marland Kitchen

## 2018-07-29 NOTE — Progress Notes (Signed)
   VASCULAR SURGERY ASSESSMENT & PLAN:   1 Day Post-Op s/p: Left lower extremity arteriogram with CO2 and minimal contrast.  He has no options for revascularization of the left lower extremity.  Currently the wounds on the left foot are stable.  I can follow this as an outpatient.  He is stable to go home from my standpoint.  He has had some issues with bradycardia and will defer this to the medical service to determine if he might need further cardiac work-up.   SUBJECTIVE:   No complaints this morning.  He wants to go home.  PHYSICAL EXAM:   Vitals:   07/28/18 2300 07/29/18 0000 07/29/18 0400 07/29/18 0540  BP: (!) 147/52 (!) 133/48 (!) 169/59   Pulse:      Resp: 19     Temp:      TempSrc:      SpO2: 97% 95%    Weight:    86.3 kg  Height:       Right groin is soft without hematoma.   The wounds on the left foot are stable.  LABS:   Lab Results  Component Value Date   WBC 5.4 07/28/2018   HGB 9.5 (L) 07/28/2018   HCT 28.6 (L) 07/28/2018   MCV 92.9 07/28/2018   PLT 126 (L) 07/28/2018   Lab Results  Component Value Date   CREATININE 1.71 (H) 07/28/2018   Lab Results  Component Value Date   INR 1.30 07/24/2018   CBG (last 3)  Recent Labs    07/28/18 1625 07/28/18 2136 07/29/18 0627  GLUCAP 106* 124* 104*    PROBLEM LIST:    Principal Problem:   Acute renal failure (ARF) (HCC) Active Problems:   Peripheral arterial disease (HCC)   Normocytic anemia   Thrombocytopenia (HCC)   Cellulitis of left anterior lower leg   Diabetic ulcer of toe of left foot associated with type 2 diabetes mellitus (HCC)   CURRENT MEDS:   . cephALEXin  500 mg Oral Q8H  . metoprolol tartrate  100 mg Oral Daily  . sodium chloride flush  3 mL Intravenous Q12H  . sodium chloride flush  3 mL Intravenous Q12H    Steve [name] Beeper: [phone] Office: [phone] 07/29/2018

## 2018-07-31 ENCOUNTER — Encounter (HOSPITAL_COMMUNITY): Payer: Self-pay | Admitting: Vascular Surgery

## 2018-08-01 MED FILL — Heparin Sod (Porcine)-NaCl IV Soln 1000 Unit/500ML-0.9%: INTRAVENOUS | Qty: 1000 | Status: AC

## 2018-08-14 NOTE — Progress Notes (Signed)
Steve [name]. Date of Birth: 11-25-1940 Medical Record #[id number]  History of Present Illness: Steve [name] is seen today for followup of CAD. He had CABG x 4 in February 2013.  EF was normal.  Other problems include HTN, DM, PVD, and HLD. 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  Echo in June 2016 showed EF 45-50% with inferior HK. LE arterial dopplers done in April 2018 were stable compared to 2015.   He was admitted in late September with AKI, LLE cellulitis, iron deficiency anemia. Had been on Indocin for ?gout. This was discontinued. ACEi held. Creatinine peaked at 3.3. Was 1.86 on DC. Renal US negative. Treated with antibiotics. Seen by vascular surgery. Had angiogram with occlusion of left popliteal and vessels below the knee. Nothing suitable for revascularization. His  Metoprolol was held also due to bradycardia with HR down to 49. He is on Eliquis now for chronic RLE DVT.    On follow up today he feels well. He is moving into [facility] assisted living tomorrow.   He still feels some burning in his left leg at times but it is much better than when he was in hospital.   Doesn't do a lot of walking.   No chest pain or SOB.  He had lab work this week with Dr. Arelia Sneddon. Reports A1c was 5.2% and metformin was stopped. States kidney function and Hgb better.   Current Outpatient Medications on File Prior to Visit  Medication Sig Dispense Refill  . allopurinol (ZYLOPRIM) 100 MG tablet Take 100 mg by mouth daily with breakfast. To prevent gout    . apixaban (ELIQUIS) 5 MG TABS tablet Take 1 tablet (5 mg total) by mouth 2 (two) times daily. 60 tablet 0  . cilostazol (PLETAL) 100 MG tablet TAKE 1 TABLET TWICE A DAY (Patient taking differently: Take 100 mg by mouth 2 (two) times daily. ) 180 tablet 0  . ferrous sulfate 325 (65 FE) MG tablet Take 325 mg by mouth daily with breakfast.    . vitamin B-12 (CYANOCOBALAMIN) 500 MCG tablet Take 500 mcg by mouth daily.    . [DISCONTINUED] furosemide (LASIX)  40 MG tablet Take 1 tablet (40 mg total) by mouth daily. For 4 days then stop. 4 tablet 0   No current facility-administered medications on file prior to visit.     Allergies  Allergen Reactions  . Statins Other (See Comments)    Myalgias, muscle weakness    Past Medical History:  Diagnosis Date  . CAD (coronary artery disease)    a. s/p CABG x 29 Nov 2011 with LIMA to LAD, SVG to OM1 and distal LCX, SVG to ramus intermediate  . Essential hypertension   . Hyperlipidemia   . Intermittent claudication ([place])   . Ischemic cardiomyopathy    a. 03/2015 Echo: EF45-50%, Gr1 DD, mild MR.  Marland Kitchen PAF (paroxysmal atrial fibrillation) ([place])    a. post op atrial fib 11/2011; short course of amiodarone; stopped 01/25/12  . Peripheral arterial disease ([place])   . Right bundle branch block   . Type II diabetes mellitus ([place])     Past Surgical History:  Procedure Laterality Date  . CARDIAC CATHETERIZATION  01/16/1999   Est. EF of 65% -- Nonobstruction atherosclerotic coronary artery disease -- Normal left ventricular function      . CARDIAC ELECTROPHYSIOLOGY STUDY AND ABLATION  05/21/1999   Normal sinus funtion -- Mildly prolonged interatrial conduction times -- Normal A-V node funtion -- Normal His Purkinje  system function -- fNo accessory pathway -- No inducible ventricular tachycardia in the presence of the or in the absence of isoproterenol with programmed stimulation or with burst pacing -- Nikki Dom, M.D. FACC surgan          . CHOLECYSTECTOMY  1999  . CORONARY ARTERY BYPASS GRAFT  12/10/2011   Procedure: CORONARY ARTERY BYPASS GRAFTING (CABG);  Surgeon: Tharon Aquas Adelene Idler, MD;  Location: [place];  Service: Open Heart Surgery;  Laterality: N/A;  Coronary Artery bypass graft on pump times four utilizing left internal mammary artery and left saphenous vein harvested endoscopically  . LEFT HEART CATHETERIZATION WITH CORONARY ANGIOGRAM N/A 12/09/2011   Procedure: LEFT HEART CATHETERIZATION WITH  CORONARY ANGIOGRAM;  Surgeon: Hussien Greenblatt M Martinique, MD;  Location: [facility] CATH LAB;  Service: Cardiovascular;  Laterality: N/A;  . LOWER EXTREMITY ANGIOGRAM Left 07/28/2018   Procedure: Lower Extremity Angiogram;  Surgeon: Angelia Mould, MD;  Location: [place] CV LAB;  Service: Cardiovascular;  Laterality: Left;    Social History   Tobacco Use  Smoking Status Former Smoker  . Packs/day: 1.50  . Years: 20.00  . Pack years: 30.00  . Types: Cigarettes  . Last attempt to quit: 05/27/1979  . Years since quitting: 39.2  Smokeless Tobacco Former Systems developer    Social History   Substance and Sexual Activity  Alcohol Use Yes  . Alcohol/week: 4.0 standard drinks  . Types: 4 Shots of liquor per week   Comment: 4 "canadian clubs each night"    Family History  Problem Relation Age of Onset  . Stroke Mother 31       Cerebellar hemorrhage    Review of Systems: The review of systems is positive for neuropathy of his feet.  All other systems were reviewed and are negative.  Physical Exam: BP (!) 148/72   Pulse (!) 102   Ht 5\' 9"  (1.753 m)   Wt 190 lb 3.2 oz (86.3 kg)   SpO2 98%   BMI 28.09 kg/m  GENERAL:  Well appearing,  WM in NAD HEENT:  PERRL, EOMI, sclera are clear. Oropharynx is clear. NECK:  No jugular venous distention, carotid upstroke brisk and symmetric, no bruits, no thyromegaly or adenopathy LUNGS:  Clear to auscultation bilaterally CHEST:  Unremarkable HEART:  RRR,  PMI not displaced or sustained,S1 and S2 within normal limits, no S3, no S4: no clicks, no rubs, no murmurs ABD:  Soft, nontender. BS +, no masses or bruits. No hepatomegaly, no splenomegaly EXT:  Poor pedal pulses. 1+ edema localized to left ankle SKIN:  Warm and dry.  No rashes NEURO:  Alert and oriented x 3. Cranial nerves II through XII intact. PSYCH:  Cognitively intact    LABORATORY DATA:   Lab Results  Component Value Date   WBC 5.4 07/29/2018   HGB 9.3 (L) 07/29/2018   HCT 27.5 (L) 07/29/2018    PLT 100 (L) 07/29/2018   GLUCOSE 147 (H) 07/29/2018   CHOL 169 02/25/2012   TRIG 249.0 (H) 02/25/2012   HDL 32.60 (L) 02/25/2012   LDLDIRECT 112.4 02/25/2012   ALT 13 07/24/2018   AST 12 (L) 07/24/2018   NA 134 (L) 07/29/2018   K 4.0 07/29/2018   CL 107 07/29/2018   CREATININE 1.86 (H) 07/29/2018   BUN 20 07/29/2018   CO2 17 (L) 07/29/2018   INR 1.30 07/24/2018   HGBA1C 5.3 04/01/2015     Echo: 6.7.16:Study Conclusions  - Left ventricle: Technically difficult study. Significant hypokinesis of  the inferior wall. EF 45-50%. Doppler parameters are consistent with abnormal left ventricular relaxation (grade 1 diastolic dysfunction). - Mitral valve: There was mild regurgitation. - Right ventricle: RV is poorly seen. TAPSE is normal. I suspect RV is OK.  Peripheral arteriogram 07/28/18: 1.  Markedly calcified aorta and common iliac arteries and external iliac arteries but no focal stenosis. 2.  Markedly calcified common femoral and superficial femoral artery. 3.  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  CLINICAL NOTE: Based on the arteriogram there are no options for revascularization in the left lower extremity.  Steve Mayo, MD, FACS [facility] from primary care April 07, 2016. Cholesterol 152, triglycerides 206, HDL 40, LDL 71. Creatinine and potassium normal. Patient reports last A1c 5.2%.   Assessment / Plan: 1. Coronary disease status post CABG. Doing  well- asymptomatic.   2. Diabetes mellitus type 2, excellent control. Off therapy now.   3. Hypertension- BP is mildly elevated. Off all anti-hypertensives. Will resume metoprolol at lower dose of 50 mg daily   4. Hyperlipidemia  History of intolerance to statins. Labs followed by primary care.   5. PAD. Arteriogram done in September 2019. Not a candidate for revascularization.  Continue medical management.  6. Peripheral neuropathy.  7. Ischemic cardiomyopathy. EF 45-50% with inferior HK.  He is asymptomatic.  No longer on ACEi due to AKI/CKD.   8. LLE cellulitis resolved.   9. Acute kidney injury. Request a copy of recent labs with Dr. Arelia Sneddon.  10. 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   I will follow up in 6 months.

## 2018-08-17 ENCOUNTER — Encounter: Payer: Self-pay | Admitting: Cardiology

## 2018-08-17 ENCOUNTER — Ambulatory Visit (INDEPENDENT_AMBULATORY_CARE_PROVIDER_SITE_OTHER): Payer: Medicare Other | Admitting: Cardiology

## 2018-08-17 VITALS — BP 148/72 | HR 102 | Ht 69.0 in | Wt 190.2 lb

## 2018-08-17 DIAGNOSIS — I2510 Atherosclerotic heart disease of native coronary artery without angina pectoris: Secondary | ICD-10-CM

## 2018-08-17 DIAGNOSIS — I10 Essential (primary) hypertension: Secondary | ICD-10-CM

## 2018-08-17 DIAGNOSIS — I739 Peripheral vascular disease, unspecified: Secondary | ICD-10-CM

## 2018-08-17 DIAGNOSIS — Z951 Presence of aortocoronary bypass graft: Secondary | ICD-10-CM | POA: Diagnosis not present

## 2018-08-17 NOTE — Patient Instructions (Signed)
Resume metoprolol 50 mg daily  Continue your other therapy  Follow up in 6 months

## 2018-09-04 ENCOUNTER — Ambulatory Visit: Payer: Medicare Other | Admitting: Hematology and Oncology

## 2018-09-04 ENCOUNTER — Other Ambulatory Visit: Payer: Medicare Other

## 2018-09-07 ENCOUNTER — Other Ambulatory Visit: Payer: Self-pay

## 2018-09-07 ENCOUNTER — Emergency Department (HOSPITAL_COMMUNITY): Payer: Medicare Other

## 2018-09-07 ENCOUNTER — Encounter (HOSPITAL_COMMUNITY): Payer: Self-pay

## 2018-09-07 ENCOUNTER — Inpatient Hospital Stay (HOSPITAL_COMMUNITY)
Admission: EM | Admit: 2018-09-07 | Discharge: 2018-09-10 | DRG: 872 | Disposition: A | Discharge status: Home / Self Care | Payer: Medicare Other | Attending: Internal Medicine | Admitting: Internal Medicine

## 2018-09-07 DIAGNOSIS — N320 Bladder-neck obstruction: Secondary | ICD-10-CM | POA: Diagnosis present

## 2018-09-07 DIAGNOSIS — N184 Chronic kidney disease, stage 4 (severe): Secondary | ICD-10-CM | POA: Diagnosis present

## 2018-09-07 DIAGNOSIS — Z7901 Long term (current) use of anticoagulants: Secondary | ICD-10-CM

## 2018-09-07 DIAGNOSIS — I4719 Other supraventricular tachycardia: Secondary | ICD-10-CM | POA: Diagnosis present

## 2018-09-07 DIAGNOSIS — R6520 Severe sepsis without septic shock: Secondary | ICD-10-CM | POA: Diagnosis not present

## 2018-09-07 DIAGNOSIS — I10 Essential (primary) hypertension: Secondary | ICD-10-CM | POA: Diagnosis present

## 2018-09-07 DIAGNOSIS — Z951 Presence of aortocoronary bypass graft: Secondary | ICD-10-CM | POA: Diagnosis not present

## 2018-09-07 DIAGNOSIS — Z86718 Personal history of other venous thrombosis and embolism: Secondary | ICD-10-CM | POA: Diagnosis not present

## 2018-09-07 DIAGNOSIS — A419 Sepsis, unspecified organism: Secondary | ICD-10-CM | POA: Diagnosis present

## 2018-09-07 DIAGNOSIS — I130 Hypertensive heart and chronic kidney disease with heart failure and stage 1 through stage 4 chronic kidney disease, or unspecified chronic kidney disease: Secondary | ICD-10-CM | POA: Diagnosis present

## 2018-09-07 DIAGNOSIS — I5022 Chronic systolic (congestive) heart failure: Secondary | ICD-10-CM | POA: Diagnosis present

## 2018-09-07 DIAGNOSIS — Z87440 Personal history of urinary (tract) infections: Secondary | ICD-10-CM

## 2018-09-07 DIAGNOSIS — D509 Iron deficiency anemia, unspecified: Secondary | ICD-10-CM | POA: Diagnosis present

## 2018-09-07 DIAGNOSIS — N179 Acute kidney failure, unspecified: Secondary | ICD-10-CM | POA: Diagnosis present

## 2018-09-07 DIAGNOSIS — Z87891 Personal history of nicotine dependence: Secondary | ICD-10-CM

## 2018-09-07 DIAGNOSIS — I2510 Atherosclerotic heart disease of native coronary artery without angina pectoris: Secondary | ICD-10-CM | POA: Diagnosis present

## 2018-09-07 DIAGNOSIS — I471 Supraventricular tachycardia: Secondary | ICD-10-CM | POA: Diagnosis present

## 2018-09-07 DIAGNOSIS — E1142 Type 2 diabetes mellitus with diabetic polyneuropathy: Secondary | ICD-10-CM | POA: Diagnosis not present

## 2018-09-07 DIAGNOSIS — E1151 Type 2 diabetes mellitus with diabetic peripheral angiopathy without gangrene: Secondary | ICD-10-CM | POA: Diagnosis present

## 2018-09-07 DIAGNOSIS — M109 Gout, unspecified: Secondary | ICD-10-CM | POA: Diagnosis present

## 2018-09-07 DIAGNOSIS — Z79899 Other long term (current) drug therapy: Secondary | ICD-10-CM | POA: Diagnosis not present

## 2018-09-07 DIAGNOSIS — R338 Other retention of urine: Secondary | ICD-10-CM | POA: Diagnosis not present

## 2018-09-07 DIAGNOSIS — E1122 Type 2 diabetes mellitus with diabetic chronic kidney disease: Secondary | ICD-10-CM | POA: Diagnosis present

## 2018-09-07 DIAGNOSIS — E785 Hyperlipidemia, unspecified: Secondary | ICD-10-CM | POA: Diagnosis present

## 2018-09-07 DIAGNOSIS — Z888 Allergy status to other drugs, medicaments and biological substances status: Secondary | ICD-10-CM

## 2018-09-07 DIAGNOSIS — I480 Paroxysmal atrial fibrillation: Secondary | ICD-10-CM | POA: Diagnosis present

## 2018-09-07 DIAGNOSIS — N390 Urinary tract infection, site not specified: Secondary | ICD-10-CM | POA: Diagnosis present

## 2018-09-07 DIAGNOSIS — E119 Type 2 diabetes mellitus without complications: Secondary | ICD-10-CM

## 2018-09-07 LAB — C DIFFICILE QUICK SCREEN W PCR REFLEX
C DIFFICILE (CDIFF) INTERP: NOT DETECTED
C Diff antigen: NEGATIVE
C Diff toxin: NEGATIVE

## 2018-09-07 LAB — URINALYSIS, ROUTINE W REFLEX MICROSCOPIC
Bilirubin Urine: NEGATIVE
GLUCOSE, UA: NEGATIVE mg/dL
KETONES UR: 5 mg/dL — AB
Nitrite: POSITIVE — AB
PROTEIN: 30 mg/dL — AB
Specific Gravity, Urine: 1.01 (ref 1.005–1.030)
WBC, UA: >50 WBC/hpf — ABNORMAL HIGH (ref 0–5)
pH: 5 (ref 5.0–8.0)

## 2018-09-07 LAB — CBC WITH DIFFERENTIAL/PLATELET
ABS IMMATURE GRANULOCYTES: 0.01 10*3/uL (ref 0.00–0.07)
Basophils Absolute: 0 10*3/uL (ref 0.0–0.1)
Basophils Relative: 0 %
Eosinophils Absolute: 0 10*3/uL (ref 0.0–0.5)
Eosinophils Relative: 0 %
HEMATOCRIT: 33 % — AB (ref 39.0–52.0)
HEMOGLOBIN: 10.6 g/dL — AB (ref 13.0–17.0)
IMMATURE GRANULOCYTES: 0 %
LYMPHS ABS: 0.2 10*3/uL — AB (ref 0.7–4.0)
LYMPHS PCT: 4 %
MCH: 30.9 pg (ref 26.0–34.0)
MCHC: 32.1 g/dL (ref 30.0–36.0)
MCV: 96.2 fL (ref 80.0–100.0)
MONOS PCT: 1 %
Monocytes Absolute: 0 10*3/uL — ABNORMAL LOW (ref 0.1–1.0)
NEUTROS ABS: 4.2 10*3/uL (ref 1.7–7.7)
NEUTROS PCT: 95 %
PLATELETS: 126 10*3/uL — AB (ref 150–400)
RBC: 3.43 MIL/uL — ABNORMAL LOW (ref 4.22–5.81)
RDW: 14.8 % (ref 11.5–15.5)
WBC: 4.4 10*3/uL (ref 4.0–10.5)
nRBC: 0 % (ref 0.0–0.2)

## 2018-09-07 LAB — COMPREHENSIVE METABOLIC PANEL
ALBUMIN: 3.2 g/dL — AB (ref 3.5–5.0)
ALT: 11 U/L (ref 0–44)
ANION GAP: 13 (ref 5–15)
AST: 22 U/L (ref 15–41)
Alkaline Phosphatase: 56 U/L (ref 38–126)
BILIRUBIN TOTAL: 1.2 mg/dL (ref 0.3–1.2)
BUN: 25 mg/dL — ABNORMAL HIGH (ref 8–23)
CO2: 17 mmol/L — AB (ref 22–32)
Calcium: 8.2 mg/dL — ABNORMAL LOW (ref 8.9–10.3)
Chloride: 104 mmol/L (ref 98–111)
Creatinine, Ser: 2.07 mg/dL — ABNORMAL HIGH (ref 0.61–1.24)
GFR calc Af Amer: 33 mL/min — ABNORMAL LOW (ref >60)
GFR calc non Af Amer: 29 mL/min — ABNORMAL LOW (ref >60)
GLUCOSE: 127 mg/dL — AB (ref 70–99)
POTASSIUM: 3.8 mmol/L (ref 3.5–5.1)
Sodium: 134 mmol/L — ABNORMAL LOW (ref 135–145)
Total Protein: 6.2 g/dL — ABNORMAL LOW (ref 6.5–8.1)

## 2018-09-07 LAB — I-STAT CG4 LACTIC ACID, ED
LACTIC ACID, VENOUS: 2.64 mmol/L — AB (ref 0.5–1.9)
Lactic Acid, Venous: 1.75 mmol/L (ref 0.5–1.9)

## 2018-09-07 LAB — LACTIC ACID, PLASMA
Lactic Acid, Venous: 1.9 mmol/L (ref 0.5–1.9)
Lactic Acid, Venous: 2.8 mmol/L (ref 0.5–1.9)

## 2018-09-07 LAB — MRSA PCR SCREENING: MRSA by PCR: NEGATIVE

## 2018-09-07 MED ORDER — BISACODYL 5 MG PO TBEC: 5.0000 mg | DELAYED_RELEASE_TABLET | Freq: Every day | ORAL | Status: DC | PRN

## 2018-09-07 MED ORDER — ALLOPURINOL 100 MG PO TABS
100.0000 mg | ORAL_TABLET | Freq: Every day | ORAL | Status: DC
  Administered 2018-09-08 – 2018-09-10 (×3): 100 mg via ORAL
  Filled 2018-09-07 (×4): qty 1

## 2018-09-07 MED ORDER — LACTATED RINGERS IV BOLUS (SEPSIS)
1000.0000 mL | Freq: Once | INTRAVENOUS | Status: AC
  Administered 2018-09-07: 1000 mL via INTRAVENOUS

## 2018-09-07 MED ORDER — CYANOCOBALAMIN 500 MCG PO TABS
500.0000 ug | ORAL_TABLET | Freq: Every day | ORAL | Status: DC
  Administered 2018-09-07 – 2018-09-10 (×4): 500 ug via ORAL
  Filled 2018-09-07 (×4): qty 1

## 2018-09-07 MED ORDER — ENOXAPARIN SODIUM 40 MG/0.4ML ~~LOC~~ SOLN: 40.0000 mg | SUBCUTANEOUS | Status: DC

## 2018-09-07 MED ORDER — SODIUM CHLORIDE 0.9 % IV SOLN
2.0000 g | INTRAVENOUS | Status: DC
  Administered 2018-09-08 – 2018-09-09 (×2): 2 g via INTRAVENOUS
  Filled 2018-09-07 (×3): qty 2

## 2018-09-07 MED ORDER — OXYCODONE HCL 5 MG PO TABS
5.0000 mg | ORAL_TABLET | ORAL | Status: DC | PRN
  Filled 2018-09-07: qty 1

## 2018-09-07 MED ORDER — APIXABAN 5 MG PO TABS
5.0000 mg | ORAL_TABLET | Freq: Two times a day (BID) | ORAL | Status: DC
  Administered 2018-09-07 – 2018-09-10 (×7): 5 mg via ORAL
  Filled 2018-09-07 (×7): qty 1

## 2018-09-07 MED ORDER — CILOSTAZOL 100 MG PO TABS
100.0000 mg | ORAL_TABLET | Freq: Two times a day (BID) | ORAL | Status: DC
  Administered 2018-09-07 – 2018-09-10 (×7): 100 mg via ORAL
  Filled 2018-09-07 (×7): qty 1

## 2018-09-07 MED ORDER — ONDANSETRON HCL 4 MG PO TABS: 4.0000 mg | ORAL_TABLET | Freq: Four times a day (QID) | ORAL | Status: DC | PRN

## 2018-09-07 MED ORDER — METOPROLOL TARTRATE 25 MG PO TABS
50.0000 mg | ORAL_TABLET | Freq: Every day | ORAL | Status: DC
  Administered 2018-09-07 – 2018-09-09 (×3): 50 mg via ORAL
  Filled 2018-09-07 (×4): qty 2

## 2018-09-07 MED ORDER — LOPERAMIDE HCL 2 MG PO CAPS
4.0000 mg | ORAL_CAPSULE | Freq: Once | ORAL | Status: AC
  Administered 2018-09-07: 4 mg via ORAL
  Filled 2018-09-07: qty 2

## 2018-09-07 MED ORDER — ACETAMINOPHEN 500 MG PO TABS
1000.0000 mg | ORAL_TABLET | Freq: Once | ORAL | Status: AC
  Administered 2018-09-07: 1000 mg via ORAL
  Filled 2018-09-07: qty 2

## 2018-09-07 MED ORDER — RINGERS IV SOLN
INTRAVENOUS | Status: DC
  Administered 2018-09-07: 12:00:00 via INTRAVENOUS

## 2018-09-07 MED ORDER — FERROUS SULFATE 325 (65 FE) MG PO TABS
325.0000 mg | ORAL_TABLET | Freq: Every day | ORAL | Status: DC
  Administered 2018-09-07 – 2018-09-10 (×4): 325 mg via ORAL
  Filled 2018-09-07 (×4): qty 1

## 2018-09-07 MED ORDER — SODIUM CHLORIDE 0.9 % IV SOLN
2.0000 g | Freq: Once | INTRAVENOUS | Status: AC
  Administered 2018-09-07: 2 g via INTRAVENOUS
  Filled 2018-09-07: qty 2

## 2018-09-07 MED ORDER — ACETAMINOPHEN 325 MG PO TABS
650.0000 mg | ORAL_TABLET | Freq: Four times a day (QID) | ORAL | Status: DC | PRN
  Administered 2018-09-08: 650 mg via ORAL
  Filled 2018-09-07: qty 2

## 2018-09-07 MED ORDER — ONDANSETRON HCL 4 MG/2ML IJ SOLN: 4.0000 mg | Freq: Four times a day (QID) | INTRAMUSCULAR | Status: DC | PRN

## 2018-09-07 MED ORDER — ENSURE ENLIVE PO LIQD
237.0000 mL | Freq: Two times a day (BID) | ORAL | Status: DC
  Administered 2018-09-08 – 2018-09-10 (×4): 237 mL via ORAL

## 2018-09-07 MED ORDER — ACETAMINOPHEN 650 MG RE SUPP: 650.0000 mg | Freq: Four times a day (QID) | RECTAL | Status: DC | PRN

## 2018-09-07 NOTE — ED Notes (Signed)
Attempted to in and out cath patient, unsuccessful. Pt states difficulty in the past as well. Condom cath placed on patient.

## 2018-09-07 NOTE — Progress Notes (Signed)
Patient arrived from ED via stretcher with foley catheter. Patient is Alert and oriented from [facility] independent living. At base line he used a walker  When needed.Patient placed on telemetry and foley care performed. Patient skin intact. Patient given welcome package and oriented to [place]. Bed alram set and call bell with8n reach.

## 2018-09-07 NOTE — Progress Notes (Signed)
Informed Dr. Earnest Conroy that patient has had several loos watery bowel movements. MD to place orders.

## 2018-09-07 NOTE — ED Provider Notes (Signed)
[place] EMERGENCY DEPARTMENT Provider Note   CSN: [id number] Arrival date & time: 09/07/18  4650     History   Chief Complaint Chief Complaint  Patient presents with  . Urinary Frequency  . Tremors    HPI Steve Tukes. is a 77 y.o. male.  HPI Patient is a 78 year old male who presents the emergency department with urinary burning and urinary frequency and found to have a fever of 105.6 on arrival to emergency department.  He has had chills and Reiger's.  Denies shortness of breath or cough.  Denies abdominal pain.  No back pain or flank pain.  States this feels like his prior urinary tract infections.  He does report new diarrhea last night.  Symptoms are mild to moderate in severity.  Heart rate 123 on arrival   Past Medical History:  Diagnosis Date  . CAD (coronary artery disease)    a. s/p CABG x 29 Nov 2011 with LIMA to LAD, SVG to OM1 and distal LCX, SVG to ramus intermediate  . Essential hypertension   . Hyperlipidemia   . Intermittent claudication ([place])   . Ischemic cardiomyopathy    a. 03/2015 Echo: EF45-50%, Gr1 DD, mild MR.  Marland Kitchen PAF (paroxysmal atrial fibrillation) ([place])    a. post op atrial fib 11/2011; short course of amiodarone; stopped 01/25/12  . Peripheral arterial disease ([place])   . Right bundle branch block   . Type II diabetes mellitus [facility])     Patient Active Problem List   Diagnosis Date Noted  . Diabetic ulcer of toe of left foot associated with type 2 diabetes mellitus ([place])   . Cellulitis of left anterior lower leg 07/25/2018  . Iron deficiency anemia 07/24/2018  . Acute kidney injury superimposed on chronic kidney disease ([place]) 07/24/2018  . Hyponatremia 07/24/2018  . Hypokalemia 07/24/2018  . Metabolic acidosis 35/46/5681  . Acute renal failure (ARF) ([place]) 07/24/2018  . Normocytic anemia 07/24/2018  . Thrombocytopenia ([place]) 07/24/2018  . Ischemic cardiomyopathy   . CAD (coronary artery disease)   . PAF (paroxysmal  atrial fibrillation) ([place])   . Essential hypertension   . Type II diabetes mellitus ([place])   . Near syncope 04/01/2015  . Dizziness and giddiness 04/01/2015  . Fatigue 04/01/2015  . Chronic renal disease, stage II 04/01/2015  . RBBB 04/01/2015  . Chest pain with moderate risk of acute coronary syndrome 04/01/2015  . PAT (paroxysmal atrial tachycardia) ([place]) 12/16/2011  . 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  . Diabetes mellitus, type 2 ([place]) 05/31/2011  . Hypertension   . PVD (peripheral vascular disease) ([place])   . Hyperlipidemia     Past Surgical History:  Procedure Laterality Date  . CARDIAC CATHETERIZATION  01/16/1999   Est. EF of 65% -- Nonobstruction atherosclerotic coronary artery disease -- Normal left ventricular function      . CARDIAC ELECTROPHYSIOLOGY STUDY AND ABLATION  05/21/1999   Normal sinus funtion -- Mildly prolonged interatrial conduction times -- Normal A-V node funtion -- Normal His Purkinje system function -- fNo accessory pathway -- No inducible ventricular tachycardia in the presence of the or in the absence of isoproterenol with programmed stimulation or with burst pacing -- Nikki Dom, M.D. FACC surgan          . CHOLECYSTECTOMY  1999  . CORONARY ARTERY BYPASS GRAFT  12/10/2011   Procedure: CORONARY ARTERY BYPASS GRAFTING (CABG);  Surgeon: Tharon Aquas Adelene Idler, MD;  Location: [place];  Service: Open Heart Surgery;  Laterality: N/A;  Coronary Artery bypass graft on pump times four utilizing left internal mammary artery and left saphenous vein harvested endoscopically  . LEFT HEART CATHETERIZATION WITH CORONARY ANGIOGRAM N/A 12/09/2011   Procedure: LEFT HEART CATHETERIZATION WITH CORONARY ANGIOGRAM;  Surgeon: Peter M Martinique, MD;  Location: [facility] CATH LAB;  Service: Cardiovascular;  Laterality: N/A;  . LOWER EXTREMITY ANGIOGRAM Left 07/28/2018   Procedure: Lower Extremity Angiogram;  Surgeon: Angelia Mould, MD;  Location: [place] CV LAB;  Service: Cardiovascular;   Laterality: Left;        Home Medications    Prior to Admission medications   Medication Sig Start Date End Date Taking? Authorizing Provider  allopurinol (ZYLOPRIM) 100 MG tablet Take 100 mg by mouth daily with breakfast. To prevent gout 07/18/18   [provider]  apixaban (ELIQUIS) 5 MG TABS tablet Take 1 tablet (5 mg total) by mouth 2 (two) times daily. 07/29/18   Ina Homes, MD  cilostazol (PLETAL) 100 MG tablet TAKE 1 TABLET TWICE A DAY Patient taking differently: Take 100 mg by mouth 2 (two) times daily.  06/23/18   Martinique, Peter M, MD  ferrous sulfate 325 (65 FE) MG tablet Take 325 mg by mouth daily with breakfast.    [provider]  vitamin B-12 (CYANOCOBALAMIN) 500 MCG tablet Take 500 mcg by mouth daily.    [provider]  furosemide (LASIX) 40 MG tablet Take 1 tablet (40 mg total) by mouth daily. For 4 days then stop. 12/15/11 01/05/12  Nani Skillern, PA-C    Family History Family History  Problem Relation Age of Onset  . Stroke Mother 45       Cerebellar hemorrhage    Social History Social History   Tobacco Use  . Smoking status: Former Smoker    Packs/day: 1.50    Years: 20.00    Pack years: 30.00    Types: Cigarettes    Last attempt to quit: 05/27/1979    Years since quitting: 39.3  . Smokeless tobacco: Former Network engineer Use Topics  . Alcohol use: Yes    Alcohol/week: 4.0 standard drinks    Types: 4 Shots of liquor per week    Comment: 4 "canadian clubs each night"  . Drug use: No     Allergies   Statins   Review of Systems Review of Systems  All other systems reviewed and are negative.    Physical Exam Updated Vital Signs BP (!) 159/104   Pulse (!) 124   Temp (!) 105.6 F (40.9 C) (Rectal)   Ht 5\' 9"  (1.753 m)   Wt 86.2 kg   SpO2 96%   BMI 28.06 kg/m   Physical Exam  Constitutional: He is oriented to person, place, and time. He appears well-developed and well-nourished.  HENT:  Head:  Normocephalic and atraumatic.  Eyes: EOM are normal.  Neck: Normal range of motion.  Cardiovascular: Normal rate, regular rhythm and normal heart sounds.  Pulmonary/Chest: Effort normal and breath sounds normal. No respiratory distress.  Abdominal: Soft. He exhibits no distension. There is no tenderness.  Musculoskeletal: Normal range of motion.  Neurological: He is alert and oriented to person, place, and time.  Skin: Skin is warm and dry.  Psychiatric: He has a normal mood and affect. Judgment normal.  Nursing note and vitals reviewed.    ED Treatments / Results  Labs (all labs ordered are listed, but only abnormal results are displayed) Labs Reviewed  COMPREHENSIVE METABOLIC PANEL - Abnormal;  Notable for the following components:      Result Value   Sodium 134 (*)    CO2 17 (*)    Glucose, Bld 127 (*)    BUN 25 (*)    Creatinine, Ser 2.07 (*)    Calcium 8.2 (*)    Total Protein 6.2 (*)    Albumin 3.2 (*)    GFR calc non Af Amer 29 (*)    GFR calc Af Amer 33 (*)    All other components within normal limits  CBC WITH DIFFERENTIAL/PLATELET - Abnormal; Notable for the following components:   RBC 3.43 (*)    Hemoglobin 10.6 (*)    HCT 33.0 (*)    Platelets 126 (*)    Lymphs Abs 0.2 (*)    Monocytes Absolute 0.0 (*)    All other components within normal limits  I-STAT CG4 LACTIC ACID, ED - Abnormal; Notable for the following components:   Lactic Acid, Venous 2.64 (*)    All other components within normal limits  CULTURE, BLOOD (ROUTINE X 2)  CULTURE, BLOOD (ROUTINE X 2)  URINE CULTURE  URINALYSIS, ROUTINE W REFLEX MICROSCOPIC    EKG EKG Interpretation  Date/Time:  Thursday September 07 2018 09:03:32 EST Ventricular Rate:  123 PR Interval:    QRS Duration: 119 QT Interval:  397 QTC Calculation: 568 R Axis:   -78 Text Interpretation:  Sinus  tachycardia Multiple ventricular premature complexes Right bundle branch block LVH with IVCD and secondary repol abnrm  Lateral leads are also involved Prolonged QT interval Artifact in lead(s) I III aVL Interpretation limited secondary to artifact Confirmed by Jola Schmidt [phone]) on 09/07/2018 10:04:11 AM   Radiology Dg Chest Portable 1 View  Result Date: 09/07/2018 CLINICAL DATA:  Fever, sepsis, UTI. Hx of coronary artery disease, hypertension, ischemic cardiomyopathy, paroxysmal afib, right bundle branch block, diabetes, CABG(2013), cardiac catheterization. Former smoker(1980). EXAM: PORTABLE CHEST 1 VIEW COMPARISON:  07/24/2018 FINDINGS: Median sternotomy and CABG. Heart size is normal. There are no focal consolidations or pleural effusions. No pulmonary edema. There is atherosclerotic calcification of the thoracic aorta IMPRESSION: Postoperative changes. No evidence for acute cardiopulmonary abnormality. Aortic atherosclerosis.  (ICD10-I70.0) Electronically Signed   By: Nolon Nations M.D.   On: 09/07/2018 09:44    Procedures .Critical Care Performed by: Jola Schmidt, MD Authorized by: Jola Schmidt, MD     CRITICAL CARE Performed by: Jola Schmidt Total critical care time: 31 minutes Critical care time was exclusive of separately billable procedures and treating other patients. Critical care was necessary to treat or prevent imminent or life-threatening deterioration. Critical care was time spent personally by me on the following activities: development of treatment plan with patient and/or surrogate as well as nursing, discussions with consultants, evaluation of patient's response to treatment, examination of patient, obtaining history from patient or surrogate, ordering and performing treatments and interventions, ordering and review of laboratory studies, ordering and review of radiographic studies, pulse oximetry and re-evaluation of patient's condition.   Medications Ordered in ED Medications  lactated ringers bolus 1,000 mL (1,000 mLs Intravenous New Bag/Given 09/07/18 0917)    And    lactated ringers bolus 1,000 mL (has no administration in time range)    And  lactated ringers bolus 1,000 mL (has no administration in time range)  ceFEPIme (MAXIPIME) 2 g in sodium chloride 0.9 % 100 mL IVPB (2 g Intravenous New Bag/Given 09/07/18 0922)  acetaminophen (TYLENOL) tablet 1,000 mg (1,000 mg Oral Given 09/07/18 0927)  Initial Impression / Assessment and Plan / ED Course  I have reviewed the triage vital signs and the nursing notes.  Pertinent labs & imaging results that were available during my care of the patient were reviewed by me and considered in my medical decision making (see chart for details).    Patient presents with urinary symptoms and a temp of 105.6.  Suspect urosepsis.  Lactate mildly elevated.  Tachycardic.  No hypotension.  No altered mental status.  Patient was given IV cefepime at this time.  Blood and urine cultures obtained.  Chest x-ray is clear.  Mild renal insufficiency.  No other significant laboratory derangements.  Patient be admitted to the hospital.  30 cc/kg bolus given.  Patient will continue to be monitored closely while in the emergency department.   Final Clinical Impressions(s) / ED Diagnoses   Final diagnoses:  Sepsis with acute organ dysfunction without septic shock, due to unspecified organism, unspecified type [facility])    ED Discharge Orders    None       Jola Schmidt, MD 09/07/18 1022

## 2018-09-07 NOTE — ED Triage Notes (Signed)
Pt arrives to ED from [place] with complaints of burning and frequent urination, also "explosive" diarrhea since last night. EMS reports pt is alert and oriented, temp of 100.9 with EMS, HR 108. Pt placed in position of comfort with bed locked and lowered, call bell in reach.

## 2018-09-07 NOTE — Progress Notes (Signed)
CRITICAL VALUE ALERT  Critical Value:  Lactic acid 2.8  Date & Time Notied:  09/07/2018 1813  Provider Notified: Dr. Earnest Conroy  Orders Received/Actions taken: no new orders recieved

## 2018-09-07 NOTE — H&P (Addendum)
History and Physical    DOA: 09/07/2018  PCP: Leonard Downing, MD  Patient coming from: ALF Chief Complaint: Dysuria and chills  HPI: Steve [name]. is a 77 y.o. male with history h/o CAD status post CABG, paroxysmal atrial fibrillation, hypertension, hyperlipidemia, cardiomyopathy with EF 45 to 50%, paroxysmal atrial fibrillation, peripheral vascular disease, diabetes mellitus was recently admitted to this [facility] with foot infection/AKI and discharged on October 5, now presents with complaints of dysuria for about 3 days and severe chills since yesterday.  He does not report fever at home but his temperature was elevated at 105 Fahrenheit rectal on presentation.  He does have a history of recurrent UTI and states this is the third episode this year.  Lab work in the ED showed elevated lactate at 2.6, WBC of 4.4, mild AKI compared to baseline.  Patient received IV cefepime and 3 L of IV fluid so far per sepsis protocol.  His repeat temp is 100.8 and he is requested to be admitted for further evaluation and management.   Review of Systems: As per HPI otherwise 10 point review of systems negative.    Past Medical History:  Diagnosis Date  . CAD (coronary artery disease)    a. s/p CABG x 29 Nov 2011 with LIMA to LAD, SVG to OM1 and distal LCX, SVG to ramus intermediate  . Essential hypertension   . Hyperlipidemia   . Intermittent claudication ([place])   . Ischemic cardiomyopathy    a. 03/2015 Echo: EF45-50%, Gr1 DD, mild MR.  Marland Kitchen PAF (paroxysmal atrial fibrillation) ([place])    a. post op atrial fib 11/2011; short course of amiodarone; stopped 01/25/12  . Peripheral arterial disease ([place])   . Right bundle branch block   . Type II diabetes mellitus ([place])     Past Surgical History:  Procedure Laterality Date  . CARDIAC CATHETERIZATION  01/16/1999   Est. EF of 65% -- Nonobstruction atherosclerotic coronary artery disease -- Normal left ventricular function      . CARDIAC  ELECTROPHYSIOLOGY STUDY AND ABLATION  05/21/1999   Normal sinus funtion -- Mildly prolonged interatrial conduction times -- Normal A-V node funtion -- Normal His Purkinje system function -- fNo accessory pathway -- No inducible ventricular tachycardia in the presence of the or in the absence of isoproterenol with programmed stimulation or with burst pacing -- Nikki Dom, M.D. FACC surgan          . CHOLECYSTECTOMY  1999  . CORONARY ARTERY BYPASS GRAFT  12/10/2011   Procedure: CORONARY ARTERY BYPASS GRAFTING (CABG);  Surgeon: Tharon Aquas Adelene Idler, MD;  Location: [place];  Service: Open Heart Surgery;  Laterality: N/A;  Coronary Artery bypass graft on pump times four utilizing left internal mammary artery and left saphenous vein harvested endoscopically  . LEFT HEART CATHETERIZATION WITH CORONARY ANGIOGRAM N/A 12/09/2011   Procedure: LEFT HEART CATHETERIZATION WITH CORONARY ANGIOGRAM;  Surgeon: Peter M Martinique, MD;  Location: [facility] CATH LAB;  Service: Cardiovascular;  Laterality: N/A;  . LOWER EXTREMITY ANGIOGRAM Left 07/28/2018   Procedure: Lower Extremity Angiogram;  Surgeon: Angelia Mould, MD;  Location: [facility] CV LAB;  Service: Cardiovascular;  Laterality: Left;    Social history:  reports that he quit smoking about 39 years ago. His smoking use included cigarettes. He has a 30.00 pack-year smoking history. He has quit using smokeless tobacco. He reports that he drinks about 4.0 standard drinks of alcohol per week. He reports that he does not use  drugs.   Allergies  Allergen Reactions  . Statins Other (See Comments)    Myalgias, muscle weakness    Family History  Problem Relation Age of Onset  . Stroke Mother 28       Cerebellar hemorrhage      Prior to Admission medications   Medication Sig Start Date End Date Taking? Authorizing Provider  allopurinol (ZYLOPRIM) 100 MG tablet Take 100 mg by mouth daily with breakfast. To prevent gout 07/18/18  Yes [provider]  apixaban (ELIQUIS) 5 MG TABS tablet Take 1 tablet (5 mg total) by mouth 2 (two) times daily. 07/29/18  Yes Helberg, Larkin Ina, MD  cilostazol (PLETAL) 100 MG tablet TAKE 1 TABLET TWICE A DAY Patient taking differently: Take 100 mg by mouth 2 (two) times daily.  06/23/18  Yes Martinique, Peter M, MD  ferrous sulfate 325 (65 FE) MG tablet Take 325 mg by mouth daily with breakfast.   Yes [provider]  metoprolol tartrate (LOPRESSOR) 100 MG tablet Take 50 mg by mouth daily.   Yes [provider]  vitamin B-12 (CYANOCOBALAMIN) 500 MCG tablet Take 500 mcg by mouth daily.   Yes [provider]  furosemide (LASIX) 40 MG tablet Take 1 tablet (40 mg total) by mouth daily. For 4 days then stop. 12/15/11 01/05/12  Nani Skillern, PA-C    Physical Exam: Vitals:   09/07/18 1145 09/07/18 1200 09/07/18 1215 09/07/18 1231  BP: (!) 148/65 (!) 150/61 (!) 151/72   Pulse: 100 100 (!) 101   Resp: (!) 29 (!) 24 (!) 27   Temp:    (!) 101.6 F (38.7 C)  TempSrc:    Rectal  SpO2: 97% 99% 97%   Weight:      Height:        Constitutional: NAD, calm, comfortable Vitals:   09/07/18 1145 09/07/18 1200 09/07/18 1215 09/07/18 1231  BP: (!) 148/65 (!) 150/61 (!) 151/72   Pulse: 100 100 (!) 101   Resp: (!) 29 (!) 24 (!) 27   Temp:    (!) 101.6 F (38.7 C)  TempSrc:    Rectal  SpO2: 97% 99% 97%   Weight:      Height:       Eyes: PERRL, lids and conjunctivae normal ENMT: Mucous membranes are moist. Posterior pharynx clear of any exudate or lesions.Normal dentition.  Neck: normal, supple, no masses, no thyromegaly Respiratory: clear to auscultation bilaterally, no wheezing, no crackles. Normal respiratory effort. No accessory muscle use.  Cardiovascular: Regular rate and rhythm, no murmurs / rubs / gallops. No extremity edema. 2+ pedal pulses. No carotid bruits.  Abdomen: no tenderness, no masses palpated. No hepatosplenomegaly. Bowel sounds positive.  Musculoskeletal: no clubbing  / cyanosis. No joint deformity upper and lower extremities. Good ROM, no contractures. Normal muscle tone.  Neurologic: CN 2-12 grossly intact. Sensation intact, DTR normal. Strength 5/5 in all 4.  Psychiatric: Normal judgment and insight. Alert and oriented x 3. Normal mood.  SKIN/catheters: no rashes, lesions, ulcers. No induration  Labs on Admission: I have personally reviewed following labs and imaging studies  CBC: Recent Labs  Lab 09/07/18 0908  WBC 4.4  NEUTROABS 4.2  HGB 10.6*  HCT 33.0*  MCV 96.2  PLT 269*   Basic Metabolic Panel: Recent Labs  Lab 09/07/18 0908  NA 134*  K 3.8  CL 104  CO2 17*  GLUCOSE 127*  BUN 25*  CREATININE 2.07*  CALCIUM 8.2*   GFR: Estimated Creatinine Clearance: 31.5  mL/min (A) (by C-G formula based on SCr of 2.07 mg/dL (H)). Liver Function Tests: Recent Labs  Lab 09/07/18 0908  AST 22  ALT 11  ALKPHOS 56  BILITOT 1.2  PROT 6.2*  ALBUMIN 3.2*   No results for input(s): LIPASE, AMYLASE in the last 168 hours. No results for input(s): AMMONIA in the last 168 hours. Coagulation Profile: No results for input(s): INR, PROTIME in the last 168 hours. Cardiac Enzymes: No results for input(s): CKTOTAL, CKMB, CKMBINDEX, TROPONINI in the last 168 hours. BNP (last 3 results) No results for input(s): PROBNP in the last 8760 hours. HbA1C: No results for input(s): HGBA1C in the last 72 hours. CBG: No results for input(s): GLUCAP in the last 168 hours. Lipid Profile: No results for input(s): CHOL, HDL, LDLCALC, TRIG, CHOLHDL, LDLDIRECT in the last 72 hours. Thyroid Function Tests: No results for input(s): TSH, T4TOTAL, FREET4, T3FREE, THYROIDAB in the last 72 hours. Anemia Panel: No results for input(s): VITAMINB12, FOLATE, FERRITIN, TIBC, IRON, RETICCTPCT in the last 72 hours. Urine analysis:    Component Value Date/Time   COLORURINE YELLOW 07/25/2018 0000   APPEARANCEUR HAZY (A) 07/25/2018 0000   LABSPEC 1.009 07/25/2018 0000    PHURINE 5.0 07/25/2018 0000   GLUCOSEU NEGATIVE 07/25/2018 0000   HGBUR SMALL (A) 07/25/2018 0000   BILIRUBINUR NEGATIVE 07/25/2018 0000   KETONESUR NEGATIVE 07/25/2018 0000   PROTEINUR NEGATIVE 07/25/2018 0000   UROBILINOGEN 1.0 04/01/2015 0900   NITRITE NEGATIVE 07/25/2018 0000   LEUKOCYTESUR TRACE (A) 07/25/2018 0000    Radiological Exams on Admission: Dg Chest Portable 1 View  Result Date: 09/07/2018 CLINICAL DATA:  Fever, sepsis, UTI. Hx of coronary artery disease, hypertension, ischemic cardiomyopathy, paroxysmal afib, right bundle branch block, diabetes, CABG(2013), cardiac catheterization. Former smoker(1980). EXAM: PORTABLE CHEST 1 VIEW COMPARISON:  07/24/2018 FINDINGS: Median sternotomy and CABG. Heart size is normal. There are no focal consolidations or pleural effusions. No pulmonary edema. There is atherosclerotic calcification of the thoracic aorta IMPRESSION: Postoperative changes. No evidence for acute cardiopulmonary abnormality. Aortic atherosclerosis.  (ICD10-I70.0) Electronically Signed   By: Nolon Nations M.D.   On: 09/07/2018 09:44    EKG: Independently reviewed.  LVH , atrial tachycardia, RBBB     Assessment and Plan:   1.  UTI with sepsis: Will admit with empiric antibiotics, IV fluids and trend serial lactate levels.  Will need straight cath to send UA/cultures.  Follow urine cultures and blood cultures.  Patient will benefit from urology evaluation as outpatient given recurrent UTI.  Check bladder scan for retention  2.  Mild AKI: Patient's baseline creatinine at 1.7-1.8.  Currently elevated at 2.0.  Repeat labs after IV hydration.  Rule out retention as discussed above.  Avoid nephrotoxic agents.  Hold Lasix /ACEI for now  3.  Paroxysmal atrial fibrillation: Patient denies any history of hematuria.  Resume anticoagulation and rate control agents.  4.  Peripheral arterial disease: Resume outpatient medications  5.  Diabetes mellitus:?  Diet controlled.   Patient states his hemoglobin A1c was 5.5 during last PCP visit 3 weeks back.  Sliding scale insulin for now  6.  CAD status post CABG, chronic systolic cardiomyopathy with EF 45 to 50%: Denies any chest pain or shortness of breath.  Resume home medications except Lasix/ACEI  DVT prophylaxis: On anticoagulation  Code Status: Full code  Family Communication: Discussed with patient. Health care proxy would be his Son [name]  Consults called: None Admission status:  Patient admitted as inpatient as anticipated LOS  greater than 2 midnights    Guilford Shi MD [facility] Pager [phone]  If 7PM-7AM, please contact night-coverage [email or web address] Password [facility]  09/07/2018, 12:46 PM   Addendum: Nurse reported diarrhea on arrival to the floor . Stool for C.diff ordered.

## 2018-09-07 NOTE — Progress Notes (Signed)
Pharmacy Antibiotic Note  Steve [name]. is a 77 y.o. male admitted on 09/07/2018 with sepsis.  Pharmacy has been consulted for Cefepime  dosing.  Plan: Cefepime 2 grams IV q24hr Will follow renal function, cultures and sensitivities.  Height: 5\' 9"  (175.3 cm) Weight: 190 lb 0.6 oz (86.2 kg) IBW/kg (Calculated) : 70.7  Temp (24hrs), Avg:105.6 F (40.9 C), Min:105.6 F (40.9 C), Max:105.6 F (40.9 C)  Recent Labs  Lab 09/07/18 0908 09/07/18 0923  WBC 4.4  --   CREATININE 2.07*  --   LATICACIDVEN  --  2.64*    Estimated Creatinine Clearance: 31.5 mL/min (A) (by C-G formula based on SCr of 2.07 mg/dL (H)).    Allergies  Allergen Reactions  . Statins Other (See Comments)    Myalgias, muscle weakness    Antimicrobials this admission: 11/14 Cefepime >>   Thank you for allowing pharmacy to be a part of this patient's care.  Alanda Slim, PharmD, [facility] Clinical Pharmacist Please see AMION for all Pharmacists' Contact Phone Numbers 09/07/2018, 9:55 AM

## 2018-09-08 ENCOUNTER — Inpatient Hospital Stay (HOSPITAL_COMMUNITY): Payer: Medicare Other

## 2018-09-08 DIAGNOSIS — N179 Acute kidney failure, unspecified: Secondary | ICD-10-CM

## 2018-09-08 DIAGNOSIS — R6520 Severe sepsis without septic shock: Secondary | ICD-10-CM

## 2018-09-08 DIAGNOSIS — A419 Sepsis, unspecified organism: Principal | ICD-10-CM

## 2018-09-08 DIAGNOSIS — N184 Chronic kidney disease, stage 4 (severe): Secondary | ICD-10-CM | POA: Diagnosis present

## 2018-09-08 LAB — BASIC METABOLIC PANEL
Anion gap: 10 (ref 5–15)
BUN: 23 mg/dL (ref 8–23)
CO2: 19 mmol/L — ABNORMAL LOW (ref 22–32)
CREATININE: 1.96 mg/dL — AB (ref 0.61–1.24)
Calcium: 7.7 mg/dL — ABNORMAL LOW (ref 8.9–10.3)
Chloride: 105 mmol/L (ref 98–111)
GFR calc Af Amer: 36 mL/min — ABNORMAL LOW (ref >60)
GFR, EST NON AFRICAN AMERICAN: 31 mL/min — AB (ref >60)
Glucose, Bld: 111 mg/dL — ABNORMAL HIGH (ref 70–99)
Potassium: 3.6 mmol/L (ref 3.5–5.1)
SODIUM: 134 mmol/L — AB (ref 135–145)

## 2018-09-08 LAB — CBC
HEMATOCRIT: 24.9 % — AB (ref 39.0–52.0)
Hemoglobin: 8.4 g/dL — ABNORMAL LOW (ref 13.0–17.0)
MCH: 32.2 pg (ref 26.0–34.0)
MCHC: 33.7 g/dL (ref 30.0–36.0)
MCV: 95.4 fL (ref 80.0–100.0)
Platelets: 104 10*3/uL — ABNORMAL LOW (ref 150–400)
RBC: 2.61 MIL/uL — ABNORMAL LOW (ref 4.22–5.81)
RDW: 15 % (ref 11.5–15.5)
WBC: 7.3 10*3/uL (ref 4.0–10.5)
nRBC: 0 % (ref 0.0–0.2)

## 2018-09-08 MED ORDER — TAMSULOSIN HCL 0.4 MG PO CAPS
0.4000 mg | ORAL_CAPSULE | Freq: Every day | ORAL | Status: DC
  Administered 2018-09-08 – 2018-09-10 (×3): 0.4 mg via ORAL
  Filled 2018-09-08 (×3): qty 1

## 2018-09-08 MED ORDER — LOPERAMIDE HCL 2 MG PO CAPS
2.0000 mg | ORAL_CAPSULE | Freq: Four times a day (QID) | ORAL | Status: DC | PRN
  Administered 2018-09-08 (×2): 2 mg via ORAL
  Filled 2018-09-08 (×2): qty 1

## 2018-09-08 NOTE — Progress Notes (Addendum)
PROGRESS NOTE                                                                                                                                                                                                             Patient Demographics:    Steve [name], is a 77 y.o. male, DOB - [birth year], RSW:[id number]  Admit date - 09/07/2018   Admitting Physician Guilford Shi, MD  Outpatient Primary MD for the patient is Leonard Downing, MD  LOS - 1  Chief Complaint  Patient presents with  . Urinary Frequency  . Tremors       Brief Narrative -  77 year old Caucasian male who lives in an assisted living facility with known history of CAD status post CABG, paroxysmal atrial fibrillation [place] vas 2 score of at least 3 on Eliquis, creatinine 2, hypertension, dyslipidemia, ischemic cardiomyopathy with a known EF of around 50%, PAD, DVT, DM type II who was recently admitted for ARF about 6 weeks ago comes back from assisted living facility with chief complaints of inability to pass urine and dysuria.  He was diagnosed with bladder obstruction, UTI, Foley was placed and he was admitted to the hospital.   Subjective:    [name] today has, No headache, No chest pain, No abdominal pain - No Nausea, No new weakness tingling or numbness, No Cough - SOB.     Assessment  & Plan :     1.  Bladder outlet obstruction causing UTI.  Placed on Flomax and Foley, urine cultures pending, sepsis pathophysiology has resolved, will switch Maxipime to Rocephin, check renal ultrasound to rule out underlying undiagnosed BPH.  Will definitely require outpatient urology follow-up as he had ARF related admission few weeks ago likely again due to BPH.  2.  Paroxysmal atrial fibrillation [place] vas 2 score of at least 4.  Continue Eliquis dosed by pharmacy, continue beta-blocker at home dose.  3.  History of DVT.  On Eliquis.  4.  Hypertension.  Continue beta-blocker at home dose and monitor.  5.   History of gout.  On allopurinol.  6.  CAD with history of chronic ischemic cardiomyopathy with chronic mild systolic CHF EF 09%.  Currently compensated no other acute issues.  7.  PAD.  Continue cilostazol.  8.  History of iron deficiency anemia.  Mild drop due to him dilution from IV fluids, continue oral iron supplementation.  No signs of active acute bleed.  9. ? H/O DM type II.  Diet controlled.  Lab Results  Component Value Date   HGBA1C 5.3 04/01/2015     Family Communication  : None present  Code Status : Full  Disposition Plan  : To ALF once better, switch to MedSurg  Consults  : None  Procedures  :   Renal ultrasound -   DVT Prophylaxis  : Eliquis  Lab Results  Component Value Date   PLT 104 (L) 09/08/2018    Diet :  Diet Order            Diet Heart Room service appropriate? Yes; Fluid consistency: Thin  Diet effective now               Inpatient Medications Scheduled Meds: . allopurinol  100 mg Oral Q breakfast  . apixaban  5 mg Oral BID  . cilostazol  100 mg Oral BID  . feeding supplement (ENSURE ENLIVE)  237 mL Oral BID BM  . ferrous sulfate  325 mg Oral Q breakfast  . metoprolol tartrate  50 mg Oral Daily  . tamsulosin  0.4 mg Oral Daily  . vitamin B-12  500 mcg Oral Daily   Continuous Infusions: . ceFEPime (MAXIPIME) IV    . ringers 125 mL/hr at 09/07/18 1221   PRN Meds:.acetaminophen **OR** acetaminophen, bisacodyl, ondansetron **OR** ondansetron (ZOFRAN) IV, oxyCODONE  Antibiotics  :   Anti-infectives (From admission, onward)   Start     Dose/Rate Route Frequency Ordered Stop   09/08/18 0900  ceFEPIme (MAXIPIME) 2 g in sodium chloride 0.9 % 100 mL IVPB     2 g 200 mL/hr over 30 Minutes Intravenous Every 24 hours 09/07/18 0957     09/07/18 0915  ceFEPIme (MAXIPIME) 2 g in sodium chloride 0.9 % 100 mL IVPB     2 g 200 mL/hr over 30 Minutes Intravenous  Once 09/07/18 0905 09/07/18 0952          Objective:   Vitals:    09/07/18 1506 09/07/18 1700 09/07/18 2104 09/08/18 0610  BP: (!) 154/66  128/80 (!) 135/55  Pulse: 97  79 64  Resp: 18   18  Temp: 99.1 F (37.3 C) 100.3 F (37.9 C) 99.3 F (37.4 C) 98.1 F (36.7 C)  TempSrc: Oral Oral Oral Oral  SpO2: 100%  98% 98%  Weight:      Height:        Wt Readings from Last 3 Encounters:  09/07/18 86.2 kg  08/17/18 86.3 kg  07/29/18 86.3 kg     Intake/Output Summary (Last 24 hours) at 09/08/2018 0932 Last data filed at 09/08/2018 0705 Gross per 24 hour  Intake 3707.59 ml  Output 2100 ml  Net 1607.59 ml     Physical Exam  Awake Alert, Oriented X 3, No new F.N deficits, Normal affect [place].AT,PERRAL Supple Neck,No JVD, No cervical lymphadenopathy appriciated.  Symmetrical Chest wall movement, Good air movement bilaterally, CTAB RRR,No Gallops,Rubs or new Murmurs, No Parasternal Heave +ve B.Sounds, Abd Soft, No tenderness, No organomegaly appriciated, No rebound - guarding or rigidity.  Foley in place No Cyanosis, Clubbing or edema, No new Rash or bruise     Data Review:    CBC Recent Labs  Lab 09/07/18 0908 09/08/18 0219  WBC 4.4 7.3  HGB 10.6* 8.4*  HCT 33.0* 24.9*  PLT 126* 104*  MCV 96.2 95.4  MCH 30.9 32.2  MCHC 32.1 33.7  RDW 14.8 15.0  LYMPHSABS 0.2*  --   MONOABS 0.0*  --   EOSABS 0.0  --  BASOSABS 0.0  --     Chemistries  Recent Labs  Lab 09/07/18 0908 09/08/18 0219  NA 134* 134*  K 3.8 3.6  CL 104 105  CO2 17* 19*  GLUCOSE 127* 111*  BUN 25* 23  CREATININE 2.07* 1.96*  CALCIUM 8.2* 7.7*  AST 22  --   ALT 11  --   ALKPHOS 56  --   BILITOT 1.2  --    ------------------------------------------------------------------------------------------------------------------ No results for input(s): CHOL, HDL, LDLCALC, TRIG, CHOLHDL, LDLDIRECT in the last 72 hours.  Lab Results  Component Value Date   HGBA1C 5.3 04/01/2015    ------------------------------------------------------------------------------------------------------------------ No results for input(s): TSH, T4TOTAL, T3FREE, THYROIDAB in the last 72 hours.  Invalid input(s): FREET3 ------------------------------------------------------------------------------------------------------------------ No results for input(s): VITAMINB12, FOLATE, FERRITIN, TIBC, IRON, RETICCTPCT in the last 72 hours.  Coagulation profile No results for input(s): INR, PROTIME in the last 168 hours.  No results for input(s): DDIMER in the last 72 hours.  Cardiac Enzymes No results for input(s): CKMB, TROPONINI, MYOGLOBIN in the last 168 hours.  Invalid input(s): CK ------------------------------------------------------------------------------------------------------------------ No results found for: BNP  Micro Results Recent Results (from the past 240 hour(s))  Blood Culture (routine x 2)     Status: None (Preliminary result)   Collection Time: 09/07/18  9:05 AM  Result Value Ref Range Status   Specimen Description BLOOD RIGHT ANTECUBITAL  Final   Special Requests   Final    BOTTLES DRAWN AEROBIC AND ANAEROBIC Blood Culture adequate volume   Culture   Final    NO GROWTH < 24 HOURS Performed at [facility], [address], [place], [place] [address]    Report Status PENDING  Incomplete  Blood Culture (routine x 2)     Status: None (Preliminary result)   Collection Time: 09/07/18  9:08 AM  Result Value Ref Range Status   Specimen Description BLOOD SITE NOT SPECIFIED  Final   Special Requests   Final    BOTTLES DRAWN AEROBIC AND ANAEROBIC Blood Culture adequate volume   Culture   Final    NO GROWTH < 24 HOURS Performed at [facility], [place] [address], [place], [place] [address]    Report Status PENDING  Incomplete  MRSA PCR Screening     Status: None   Collection Time: 09/07/18  3:35 PM  Result Value Ref Range Status   MRSA by PCR NEGATIVE  NEGATIVE Final    Comment:        The GeneXpert MRSA Assay (FDA approved for NASAL specimens only), is one component of a comprehensive MRSA colonization surveillance program. It is not intended to diagnose MRSA infection nor to guide or monitor treatment for MRSA infections. Performed at [facility], [place] [address] [place], [place] [address]   C difficile quick scan w PCR reflex     Status: None   Collection Time: 09/07/18  6:20 PM  Result Value Ref Range Status   C Diff antigen NEGATIVE NEGATIVE Final   C Diff toxin NEGATIVE NEGATIVE Final   C Diff interpretation No C. difficile detected.  Final    Comment: Performed at [facility], [place] [address] [place],  [address]    Radiology Reports Dg Chest Portable 1 View  Result Date: 09/07/2018 CLINICAL DATA:  Fever, sepsis, UTI. Hx of coronary artery disease, hypertension, ischemic cardiomyopathy, paroxysmal afib, right bundle branch block, diabetes, CABG(2013), cardiac catheterization. Former smoker(1980). EXAM: PORTABLE CHEST 1 VIEW COMPARISON:  07/24/2018 FINDINGS:  Median sternotomy and CABG. Heart size is normal. There are no focal consolidations or pleural effusions. No pulmonary edema. There is atherosclerotic calcification of the thoracic aorta IMPRESSION: Postoperative changes. No evidence for acute cardiopulmonary abnormality. Aortic atherosclerosis.  (ICD10-I70.0) Electronically Signed   By: Nolon Nations M.D.   On: 09/07/2018 09:44    Time Spent in minutes  30   Lala Lund M.D on 09/08/2018 at 9:32 AM  To page go to [email or web address] - password [facility]

## 2018-09-08 NOTE — Progress Notes (Signed)
Initial Nutrition Assessment  DOCUMENTATION CODES:   Not applicable  INTERVENTION:  -Downgrade to Dysphagia 3 (mechanical soft diet)   -Continue Ensure Enlive BID   NUTRITION DIAGNOSIS:   Inadequate oral intake related to decreased appetite as evidenced by per patient/family report.  GOAL:   Patient will meet greater than or equal to 90% of their needs  MONITOR:   PO intake, Supplement acceptance, Weight trends  REASON FOR ASSESSMENT:   Malnutrition Screening Tool    ASSESSMENT:  Mr. Guidice is a 77 yo male with PMH of CAD s/p CABG, paroxysmal a.fib, HTN, HLD, cardiomyopathy with EF 45-50%, PAD, DM, recent admission for foot infection/AKI discharged 10/5, now presenting with complaints of dysuria ongoing 3 days and severe chills, admitted for sepsis with mild AKI.   Visited pt at bedside during lunch. Asked pt how he has been eating and pt says "not good, this food is not worth a damn!" Pt complains that food is cold and too hard to chew as he has no teeth. Offered to switch pt to softer diet and he is agreeable. He likes the Ensure supplements.   Pt had poor appetite PTA for 3 days due to feeling sick and had decreased appetite. He stated that he had 1 meal in a day and a half. PTA pt was living at independent living facility, where he cooks his own breakfast and receives lunch and dinner at congregate meals. He says the food there is very good and he usually eats well.   Asked pt if he has lost wt, pt says "I don't know". States his UBW is 184#. Obtained bed wt 86.5 kg (190 lb).   Noted pt had diarrhea in bedside commode. Pt has had diarrhea since admission, has been C.Diff tested and is negative.  Noted pt says that he "hates" oatmeal and grits, would rather have scrambled eggs and potatoes for breakfast. Will note these dislikes in meal system.   Will downgrade to mechanical soft diet for ease of intake, and continue Ensure supplementation during hospitalization.   Meds:  ferrous sulfate, B12, Ensure  Labs: sodium 134  NUTRITION - FOCUSED PHYSICAL EXAM:    Most Recent Value  Orbital Region  No depletion  Upper Arm Region  No depletion  Thoracic and Lumbar Region  No depletion  Buccal Region  No depletion  Temple Region  No depletion  Clavicle Bone Region  No depletion  Clavicle and Acromion Bone Region  No depletion  Scapular Bone Region  No depletion  Dorsal Hand  Mild depletion  Patellar Region  Mild depletion  Anterior Thigh Region  Mild depletion  Posterior Calf Region  Mild depletion  Edema (RD Assessment)  None  Hair  Reviewed  Eyes  Reviewed  Mouth  Reviewed [no teeth]  Skin  Reviewed  Nails  Reviewed      Diet Order:   Diet Order            Diet Heart Room service appropriate? Yes; Fluid consistency: Thin  Diet effective now              EDUCATION NEEDS:   No education needs have been identified at this time  Skin:  Skin Assessment: Reviewed RN Assessment  Last BM:  11/15 type 7  Height:   Ht Readings from Last 1 Encounters:  09/07/18 5\' 9"  (1.753 m)    Weight:   Wt Readings from Last 1 Encounters:  09/07/18 86.2 kg    Ideal Body Weight:  72.7  kg  BMI:  Body mass index is 28.06 kg/m.  Estimated Nutritional Needs:   Kcal:  1900-2200  Protein:  95-110 gm  Fluid:  >/=1.9 L    Mikesha Migliaccio, Dietetic Intern [phone]

## 2018-09-09 DIAGNOSIS — E1142 Type 2 diabetes mellitus with diabetic polyneuropathy: Secondary | ICD-10-CM

## 2018-09-09 DIAGNOSIS — N184 Chronic kidney disease, stage 4 (severe): Secondary | ICD-10-CM

## 2018-09-09 LAB — URINE CULTURE: Culture: 80000 — AB

## 2018-09-09 MED ORDER — LACTATED RINGERS IV SOLN
INTRAVENOUS | Status: DC
  Administered 2018-09-09 – 2018-09-10 (×4): via INTRAVENOUS

## 2018-09-09 NOTE — Progress Notes (Signed)
This RN Data processing manager, Pharmacy, and ED. Ringer's Solution unavailable. Bodenheimer, NP notified. LR order entered per Silas Sacramento, NP.

## 2018-09-09 NOTE — Evaluation (Signed)
Physical Therapy Evaluation Patient Details Name: Steve [name]. MRN: [id number] DOB: 1941/04/28 Today's Date: 09/09/2018   History of Present Illness  77 year old male who lives in an assisted living facility with known history of CAD status post CABG, paroxysmal atrial fibrillation on Eliquis, creatinine 2, hypertension, dyslipidemia, ischemic cardiomyopathy with a known EF of around 50%, PAD, DVT, DM type II who was recently admitted for ARF about 6 weeks ago comes back from assisted living facility with chief complaints of inability to pass urine and dysuria.  He was diagnosed with bladder obstruction, UTI.  Clinical Impression  Patient evaluated by Physical Therapy with no further acute PT needs identified. All education has been completed and the patient has no further questions. Demonstrates ability to ambulate without physical assist from PT up to 275 feet, lightly using RW. At baseline pt rarely uses RW but feels a little weak from his current diagnosis. States he will do a few exercises at the gym within his ALF when he returns to get his strength back and enjoys his independence. See below for any follow-up Physical Therapy or equipment needs. PT is signing off. Thank you for this referral.     Follow Up Recommendations No PT follow up    Equipment Recommendations  None recommended by PT    Recommendations for Other Services       Precautions / Restrictions Precautions Precautions: Fall Restrictions Weight Bearing Restrictions: No      Mobility  Bed Mobility               General bed mobility comments: In recliner  Transfers Overall transfer level: Modified independent               General transfer comment: No assist required, mild effort with rise from chair.  Ambulation/Gait Ambulation/Gait assistance: Modified independent (Device/Increase time) Gait Distance (Feet): 275 Feet Assistive device: Rolling walker (2 wheeled) Gait Pattern/deviations:  Step-through pattern;Trunk flexed Gait velocity: WNL   General Gait Details: Slight forward lean on RW, using minimally. No overt instability noted.  Stairs            Wheelchair Mobility    Modified Rankin (Stroke Patients Only)       Balance Overall balance assessment: Mild deficits observed, not formally tested                                           Pertinent Vitals/Pain Pain Assessment: No/denies pain    Home Living Family/patient expects to be discharged to:: ([facility])                      Prior Function Level of Independence: Independent         Comments: Intermittent use of RW secondary to L foot pain     Hand Dominance        Extremity/Trunk Assessment   Upper Extremity Assessment Upper Extremity Assessment: Defer to OT evaluation    Lower Extremity Assessment Lower Extremity Assessment: Overall WFL for tasks assessed       Communication   Communication: HOH  Cognition Arousal/Alertness: Awake/alert Behavior During Therapy: WFL for tasks assessed/performed Overall Cognitive Status: Within Functional Limits for tasks assessed  General Comments General comments (skin integrity, edema, etc.): Romberg Eyes Open wnl, wide BOS EC WNL, Romberg Eyes closed LOB able to correct.    Exercises     Assessment/Plan    PT Assessment Patent does not need any further PT services  PT Problem List         PT Treatment Interventions      PT Goals (Current goals can be found in the Care Plan section)  Acute Rehab PT Goals Patient Stated Goal: Go back to [facility]. I do not want physical therapy. PT Goal Formulation: All assessment and education complete, DC therapy    Frequency     Barriers to discharge        Co-evaluation               AM-PAC PT "6 Clicks" Daily Activity  Outcome Measure Difficulty turning over in bed (including  adjusting bedclothes, sheets and blankets)?: None Difficulty moving from lying on back to sitting on the side of the bed? : None Difficulty sitting down on and standing up from a chair with arms (e.g., wheelchair, bedside commode, etc,.)?: A Little Help needed moving to and from a bed to chair (including a wheelchair)?: None Help needed walking in hospital room?: None Help needed climbing 3-5 steps with a railing? : None 6 Click Score: 23    End of Session Equipment Utilized During Treatment: Gait belt Activity Tolerance: Patient tolerated treatment well Patient left: in chair;with call bell/phone within reach;with chair alarm set   PT Visit Diagnosis: Unsteadiness on feet (R26.81)    Time: 1000-1014 PT Time Calculation (min) (ACUTE ONLY): 14 min   Charges:   PT Evaluation $PT Eval Low Complexity: Esparto, PT, DPT  Ellouise Newer 09/09/2018, 10:39 AM

## 2018-09-09 NOTE — Progress Notes (Addendum)
PROGRESS NOTE                                                                                                                                                                                                             Patient Demographics:    Steve [name], is a 77 y.o. male, DOB - 1941-04-17, EZM:[id number]  Admit date - 09/07/2018   Admitting Physician Guilford Shi, MD  Outpatient Primary MD for the patient is Leonard Downing, MD  LOS - 2  Chief Complaint  Patient presents with  . Urinary Frequency  . Tremors       Brief Narrative -  77 year old Caucasian male who lives in an assisted living facility with known history of CAD status post CABG, paroxysmal atrial fibrillation [place] vas 2 score of at least 3 on Eliquis, creatinine 2, hypertension, dyslipidemia, ischemic cardiomyopathy with a known EF of around 50%, PAD, DVT, DM type II who was recently admitted for ARF about 6 weeks ago comes back from assisted living facility with chief complaints of inability to pass urine and dysuria.  He was diagnosed with bladder obstruction, UTI, Foley was placed and he was admitted to the hospital.   Subjective:   Patient in bed, appears comfortable, denies any headache, no fever, no chest pain or pressure, no shortness of breath , no abdominal pain. No focal weakness.  Says he cannot tolerate Foley and it has to be removed.    Assessment  & Plan :     1.  Bladder outlet obstruction causing UTI with Sepsis.  Really suspicious for BPH, placed on Flomax, he is now refusing Foley hence we will take it out on 09/09/2018 may need to be replaced, urine cultures noted growing pan resistant Enterobacter species, currently on Maxipime, likely will require Cipro for 3 to 4 days upon discharge with outpatient urology follow-up.  Continue Flomax.  Sepsis pathophysiology has resolved.  2.  Paroxysmal atrial fibrillation [place] vas 2 score of at least 4.  Continue Eliquis dosed by pharmacy,  continue beta-blocker at home dose.  3.  History of DVT.  On Eliquis.  4.  Hypertension.  Continue beta-blocker at home dose and monitor.  5.  History of gout.  On allopurinol.  6.  CAD with history of chronic ischemic cardiomyopathy with chronic mild systolic CHF EF 50%.  Currently compensated no other acute issues.  7.  PAD.  Continue cilostazol.  8.  History of iron deficiency anemia.  Mild drop due to him  dilution from IV fluids, continue oral iron supplementation.  No signs of active acute bleed.  9.  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  Baseline creatinine around 1.9.  Improved with hydration and initial Foley placement, continue to monitor.  10. ? H/O DM type II.  Diet controlled.   Lab Results  Component Value Date   HGBA1C 5.3 04/01/2015     Family Communication  : None present  Code Status : Full  Disposition Plan  : To ALF once better, switch to MedSurg  Consults  : None  Procedures  :   Renal ultrasound -nonacute  DVT Prophylaxis  : Eliquis  Lab Results  Component Value Date   PLT 104 (L) 09/08/2018    Diet :  Diet Order            DIET DYS 3 Room service appropriate? Yes; Fluid consistency: Thin  Diet effective now               Inpatient Medications Scheduled Meds: . allopurinol  100 mg Oral Q breakfast  . apixaban  5 mg Oral BID  . cilostazol  100 mg Oral BID  . feeding supplement (ENSURE ENLIVE)  237 mL Oral BID BM  . ferrous sulfate  325 mg Oral Q breakfast  . metoprolol tartrate  50 mg Oral Daily  . tamsulosin  0.4 mg Oral Daily  . vitamin B-12  500 mcg Oral Daily   Continuous Infusions: . ceFEPime (MAXIPIME) IV 2 g (09/09/18 0929)  . lactated ringers 125 mL/hr at 09/09/18 0700   PRN Meds:.acetaminophen **OR** acetaminophen, bisacodyl, loperamide, ondansetron **OR** ondansetron (ZOFRAN) IV, oxyCODONE  Antibiotics  :   Anti-infectives (From admission, onward)   Start     Dose/Rate Route Frequency Ordered Stop   09/08/18 0900  ceFEPIme  (MAXIPIME) 2 g in sodium chloride 0.9 % 100 mL IVPB     2 g 200 mL/hr over 30 Minutes Intravenous Every 24 hours 09/07/18 0957     09/07/18 0915  ceFEPIme (MAXIPIME) 2 g in sodium chloride 0.9 % 100 mL IVPB     2 g 200 mL/hr over 30 Minutes Intravenous  Once 09/07/18 0905 09/07/18 0952          Objective:   Vitals:   09/08/18 0610 09/08/18 1300 09/08/18 2118 09/09/18 0619  BP: (!) 135/55 (!) 136/58 (!) 168/73 (!) 157/75  Pulse: 64 66 96 76  Resp: 18 18 18 18   Temp: 98.1 F (36.7 C) 98.7 F (37.1 C) (!) 100.4 F (38 C) 98.7 F (37.1 C)  TempSrc: Oral Oral    SpO2: 98% 98% 92% 94%  Weight:      Height:        Wt Readings from Last 3 Encounters:  09/07/18 86.2 kg  08/17/18 86.3 kg  07/29/18 86.3 kg     Intake/Output Summary (Last 24 hours) at 09/09/2018 0944 Last data filed at 09/09/2018 0700 Gross per 24 hour  Intake 2664.17 ml  Output 2675 ml  Net -10.83 ml     Physical Exam  Awake Alert, Oriented X 3, No new F.N deficits, Normal affect [place].AT,PERRAL Supple Neck,No JVD, No cervical lymphadenopathy appriciated.  Symmetrical Chest wall movement, Good air movement bilaterally, CTAB RRR,No Gallops, Rubs or new Murmurs, No Parasternal Heave +ve B.Sounds, Abd Soft, No tenderness, No organomegaly appriciated, No rebound - guarding or rigidity. No Cyanosis, Clubbing or edema, No new Rash or bruise     Data Review:    CBC Recent Labs  Lab 09/07/18 0908 09/08/18 0219  WBC 4.4 7.3  HGB 10.6* 8.4*  HCT 33.0* 24.9*  PLT 126* 104*  MCV 96.2 95.4  MCH 30.9 32.2  MCHC 32.1 33.7  RDW 14.8 15.0  LYMPHSABS 0.2*  --   MONOABS 0.0*  --   EOSABS 0.0  --   BASOSABS 0.0  --     Chemistries  Recent Labs  Lab 09/07/18 0908 09/08/18 0219  NA 134* 134*  K 3.8 3.6  CL 104 105  CO2 17* 19*  GLUCOSE 127* 111*  BUN 25* 23  CREATININE 2.07* 1.96*  CALCIUM 8.2* 7.7*  AST 22  --   ALT 11  --   ALKPHOS 56  --   BILITOT 1.2  --     ------------------------------------------------------------------------------------------------------------------ No results for input(s): CHOL, HDL, LDLCALC, TRIG, CHOLHDL, LDLDIRECT in the last 72 hours.  Lab Results  Component Value Date   HGBA1C 5.3 04/01/2015   ------------------------------------------------------------------------------------------------------------------ No results for input(s): TSH, T4TOTAL, T3FREE, THYROIDAB in the last 72 hours.  Invalid input(s): FREET3 ------------------------------------------------------------------------------------------------------------------ No results for input(s): VITAMINB12, FOLATE, FERRITIN, TIBC, IRON, RETICCTPCT in the last 72 hours.  Coagulation profile No results for input(s): INR, PROTIME in the last 168 hours.  No results for input(s): DDIMER in the last 72 hours.  Cardiac Enzymes No results for input(s): CKMB, TROPONINI, MYOGLOBIN in the last 168 hours.  Invalid input(s): CK ------------------------------------------------------------------------------------------------------------------ No results found for: BNP  Micro Results Recent Results (from the past 240 hour(s))  Blood Culture (routine x 2)     Status: None (Preliminary result)   Collection Time: 09/07/18  9:05 AM  Result Value Ref Range Status   Specimen Description BLOOD RIGHT ANTECUBITAL  Final   Special Requests   Final    BOTTLES DRAWN AEROBIC AND ANAEROBIC Blood Culture adequate volume   Culture   Final    NO GROWTH 2 DAYS Performed at [facility], [address], [place], [place] [address]    Report Status PENDING  Incomplete  Blood Culture (routine x 2)     Status: None (Preliminary result)   Collection Time: 09/07/18  9:08 AM  Result Value Ref Range Status   Specimen Description BLOOD SITE NOT SPECIFIED  Final   Special Requests   Final    BOTTLES DRAWN AEROBIC AND ANAEROBIC Blood Culture adequate volume   Culture   Final    NO  GROWTH 2 DAYS Performed at [facility], [address] [place], [place] [address]    Report Status PENDING  Incomplete  Urine culture     Status: Abnormal   Collection Time: 09/07/18 12:54 PM  Result Value Ref Range Status   Specimen Description URINE, RANDOM  Final   Special Requests   Final    NONE Performed at [facility], [place] [address], [place],  [address]    Culture 80,000 COLONIES/mL ENTEROBACTER SPECIES (A)  Final   Report Status 09/09/2018 FINAL  Final   Organism ID, Bacteria ENTEROBACTER SPECIES (A)  Final      Susceptibility   Enterobacter species - MIC*    CEFAZOLIN >=64 RESISTANT Resistant     CEFTRIAXONE 16 INTERMEDIATE Intermediate     CIPROFLOXACIN <=0.25 SENSITIVE Sensitive     GENTAMICIN <=1 SENSITIVE Sensitive     IMIPENEM <=0.25 SENSITIVE Sensitive     NITROFURANTOIN 64 INTERMEDIATE Intermediate     TRIMETH/SULFA <=20 SENSITIVE Sensitive     PIP/TAZO >=128 RESISTANT Resistant     *  80,000 COLONIES/mL ENTEROBACTER SPECIES  MRSA PCR Screening     Status: None   Collection Time: 09/07/18  3:35 PM  Result Value Ref Range Status   MRSA by PCR NEGATIVE NEGATIVE Final    Comment:        The GeneXpert MRSA Assay (FDA approved for NASAL specimens only), is one component of a comprehensive MRSA colonization surveillance program. It is not intended to diagnose MRSA infection nor to guide or monitor treatment for MRSA infections. Performed at [facility], [place] [address], [place], [place] [address]   C difficile quick scan w PCR reflex     Status: None   Collection Time: 09/07/18  6:20 PM  Result Value Ref Range Status   C Diff antigen NEGATIVE NEGATIVE Final   C Diff toxin NEGATIVE NEGATIVE Final   C Diff interpretation No C. difficile detected.  Final    Comment: Performed at [facility], [place] [address] [place], [place] [address]    Radiology Reports US Renal  Result Date: 09/08/2018 CLINICAL DATA:  Acute renal  failure. EXAM: RENAL / URINARY TRACT ULTRASOUND COMPLETE COMPARISON:  All renal ultrasound of July 24, 2018 FINDINGS: Right Kidney: Renal measurements: 11.9 x 5.4 x 5.9 cm = volume: 199 mL . Echogenicity within normal limits. No mass or hydronephrosis visualized. Left Kidney: Renal measurements: 12.9 x 6.9 x 5.5 cm = volume: 256 mL. Echogenicity within normal limits. No mass or hydronephrosis visualized. Bladder: The urinary bladder is decompressed by a Foley catheter. IMPRESSION: Normal renal ultrasound examination. Electronically Signed   By: David  Martinique M.D.   On: 09/08/2018 10:05   Dg Chest Portable 1 View  Result Date: 09/07/2018 CLINICAL DATA:  Fever, sepsis, UTI. Hx of coronary artery disease, hypertension, ischemic cardiomyopathy, paroxysmal afib, right bundle branch block, diabetes, CABG(2013), cardiac catheterization. Former smoker(1980). EXAM: PORTABLE CHEST 1 VIEW COMPARISON:  07/24/2018 FINDINGS: Median sternotomy and CABG. Heart size is normal. There are no focal consolidations or pleural effusions. No pulmonary edema. There is atherosclerotic calcification of the thoracic aorta IMPRESSION: Postoperative changes. No evidence for acute cardiopulmonary abnormality. Aortic atherosclerosis.  (ICD10-I70.0) Electronically Signed   By: Nolon Nations M.D.   On: 09/07/2018 09:44    Time Spent in minutes  30   Lala Lund M.D on 09/09/2018 at 9:44 AM  To page go to [email or web address] - password [facility]

## 2018-09-09 NOTE — Progress Notes (Signed)
Bodenheimer, NP paged to clarify Ringer's solution order. Per pharmacy LR and Ringer's Solution are different. This RN could not find any information in the notes regarding the IVF. Bodenheimer, NP states that he is not sure why the Ringer's Solution would be necessary but prefers to continue the Ringer's Solution at this time. Attending MD to address in AM.

## 2018-09-10 DIAGNOSIS — I10 Essential (primary) hypertension: Secondary | ICD-10-CM

## 2018-09-10 DIAGNOSIS — I471 Supraventricular tachycardia: Secondary | ICD-10-CM

## 2018-09-10 DIAGNOSIS — R338 Other retention of urine: Secondary | ICD-10-CM

## 2018-09-10 LAB — BLOOD CULTURE ID PANEL (REFLEXED)
ACINETOBACTER BAUMANNII: NOT DETECTED
CANDIDA ALBICANS: NOT DETECTED
CANDIDA GLABRATA: NOT DETECTED
CANDIDA PARAPSILOSIS: NOT DETECTED
CANDIDA TROPICALIS: NOT DETECTED
Candida krusei: NOT DETECTED
Carbapenem resistance: NOT DETECTED
ENTEROBACTER CLOACAE COMPLEX: DETECTED — AB
ESCHERICHIA COLI: NOT DETECTED
Enterobacteriaceae species: DETECTED — AB
Enterococcus species: NOT DETECTED
Haemophilus influenzae: NOT DETECTED
KLEBSIELLA PNEUMONIAE: NOT DETECTED
Klebsiella oxytoca: NOT DETECTED
Listeria monocytogenes: NOT DETECTED
Neisseria meningitidis: NOT DETECTED
PROTEUS SPECIES: NOT DETECTED
Pseudomonas aeruginosa: NOT DETECTED
SERRATIA MARCESCENS: NOT DETECTED
STAPHYLOCOCCUS SPECIES: NOT DETECTED
Staphylococcus aureus (BCID): NOT DETECTED
Streptococcus agalactiae: NOT DETECTED
Streptococcus pneumoniae: NOT DETECTED
Streptococcus pyogenes: NOT DETECTED
Streptococcus species: NOT DETECTED

## 2018-09-10 LAB — CBC
HEMATOCRIT: 24.5 % — AB (ref 39.0–52.0)
Hemoglobin: 8.3 g/dL — ABNORMAL LOW (ref 13.0–17.0)
MCH: 32 pg (ref 26.0–34.0)
MCHC: 33.9 g/dL (ref 30.0–36.0)
MCV: 94.6 fL (ref 80.0–100.0)
NRBC: 0 % (ref 0.0–0.2)
Platelets: 81 10*3/uL — ABNORMAL LOW (ref 150–400)
RBC: 2.59 MIL/uL — ABNORMAL LOW (ref 4.22–5.81)
RDW: 14.5 % (ref 11.5–15.5)
WBC: 5.6 10*3/uL (ref 4.0–10.5)

## 2018-09-10 LAB — BASIC METABOLIC PANEL
ANION GAP: 10 (ref 5–15)
BUN: 14 mg/dL (ref 8–23)
CO2: 21 mmol/L — AB (ref 22–32)
Calcium: 8.1 mg/dL — ABNORMAL LOW (ref 8.9–10.3)
Chloride: 104 mmol/L (ref 98–111)
Creatinine, Ser: 1.35 mg/dL — ABNORMAL HIGH (ref 0.61–1.24)
GFR calc Af Amer: 56 mL/min — ABNORMAL LOW (ref >60)
GFR calc non Af Amer: 48 mL/min — ABNORMAL LOW (ref >60)
GLUCOSE: 139 mg/dL — AB (ref 70–99)
Potassium: 3.6 mmol/L (ref 3.5–5.1)
Sodium: 135 mmol/L (ref 135–145)

## 2018-09-10 MED ORDER — TAMSULOSIN HCL 0.4 MG PO CAPS: 0.4000 mg | ORAL_CAPSULE | Freq: Every day | ORAL | 0 refills | Status: AC

## 2018-09-10 MED ORDER — CIPROFLOXACIN HCL 500 MG PO TABS: 500.0000 mg | ORAL_TABLET | Freq: Two times a day (BID) | ORAL | 0 refills | Status: AC

## 2018-09-10 MED ORDER — PROBIOTIC & ACIDOPHILUS EX ST PO CAPS: 1.0000 | ORAL_CAPSULE | Freq: Two times a day (BID) | ORAL | 0 refills | Status: DC

## 2018-09-10 NOTE — Progress Notes (Signed)
PHARMACY - PHYSICIAN COMMUNICATION CRITICAL VALUE ALERT - BLOOD CULTURE IDENTIFICATION (BCID)  Steve [name]. is an 77 y.o. male who presented to [facility] on 09/07/2018 with a chief complaint of urinary frequency and tremors  Assessment:  1/4 BC positive  Name of physician (or Provider) Contacted: Dr Candiss Norse  Current antibiotics: cefepime  Changes to prescribed antibiotics recommended:  none  Results for orders placed or performed during the hospital encounter of 09/07/18  Blood Culture ID Panel (Reflexed) (Collected: 09/07/2018  9:08 AM)  Result Value Ref Range   Enterococcus species NOT DETECTED NOT DETECTED   Listeria monocytogenes NOT DETECTED NOT DETECTED   Staphylococcus species NOT DETECTED NOT DETECTED   Staphylococcus aureus (BCID) NOT DETECTED NOT DETECTED   Streptococcus species NOT DETECTED NOT DETECTED   Streptococcus agalactiae NOT DETECTED NOT DETECTED   Streptococcus pneumoniae NOT DETECTED NOT DETECTED   Streptococcus pyogenes NOT DETECTED NOT DETECTED   Acinetobacter baumannii NOT DETECTED NOT DETECTED   Enterobacteriaceae species DETECTED (A) NOT DETECTED   Enterobacter cloacae complex DETECTED (A) NOT DETECTED   Escherichia coli NOT DETECTED NOT DETECTED   Klebsiella oxytoca NOT DETECTED NOT DETECTED   Klebsiella pneumoniae NOT DETECTED NOT DETECTED   Proteus species NOT DETECTED NOT DETECTED   Serratia marcescens NOT DETECTED NOT DETECTED   Carbapenem resistance NOT DETECTED NOT DETECTED   Haemophilus influenzae NOT DETECTED NOT DETECTED   Neisseria meningitidis NOT DETECTED NOT DETECTED   Pseudomonas aeruginosa NOT DETECTED NOT DETECTED   Candida albicans NOT DETECTED NOT DETECTED   Candida glabrata NOT DETECTED NOT DETECTED   Candida krusei NOT DETECTED NOT DETECTED   Candida parapsilosis NOT DETECTED NOT DETECTED   Candida tropicalis NOT DETECTED NOT DETECTED    Beverlee Nims 09/10/2018  7:36 AM

## 2018-09-10 NOTE — Progress Notes (Signed)
Patient voided 300 ml. PVR at most 455 ml. This RN asked patient to try to urinate again. Patient attempted to stand and use urinal only a few minutes, stating that he does not have to urinate. When cath mentioned, patient refuses, stating that he will not have a catheter again even if it kills him. Bodenheimer, NP notified. Will continue to monitor.

## 2018-09-10 NOTE — Discharge Instructions (Signed)
Follow with Primary MD Leonard Downing, MD in 7 days   Get CBC, BMP checked  by Primary MD n 5-7 days    Activity: As tolerated with Full fall precautions use walker/cane & assistance as needed  Disposition Home    Diet: Soft heart healthy diet with aspiration precautions  Special Instructions: If you have smoked or chewed Tobacco  in the last 2 yrs please stop smoking, stop any regular Alcohol  and or any Recreational drug use.  On your next visit with your primary care physician please Get Medicines reviewed and adjusted.  Please request your Prim.MD to go over all Hospital Tests and Procedure/Radiological results at the follow up, please get all Hospital records sent to your Prim MD by signing hospital release before you go home.  If you experience worsening of your admission symptoms, develop shortness of breath, life threatening emergency, suicidal or homicidal thoughts you must seek medical attention immediately by calling 911 or calling your MD immediately  if symptoms less severe.  You Must read complete instructions/literature along with all the possible adverse reactions/side effects for all the Medicines you take and that have been prescribed to you. Take any new Medicines after you have completely understood and accpet all the possible adverse reactions/side effects.

## 2018-09-10 NOTE — Discharge Summary (Signed)
Steve [name]. WER:[id number] DOB: 07/23/1941 DOA: 09/07/2018  PCP: Leonard Downing, MD  Admit date: 09/07/2018  Discharge date: 09/10/2018  Admitted From: Home Disposition:  Home   Recommendations for Outpatient Follow-up:   Follow up with PCP in 1-2 weeks  PCP Please obtain BMP/CBC, 2 view CXR in 1week,  (see Discharge instructions)   PCP Please follow up on the following pending results:    Home Health: None   Equipment/Devices: None  Consultations: None Discharge Condition: Stable   CODE STATUS: Full   Diet Recommendation: Soft Heart Healthy     Chief Complaint  Patient presents with  . Urinary Frequency  . Tremors     Brief history of present illness from the day of admission and additional interim summary    77 year old Caucasian male who lives in an assisted living facility with known history of CAD status post CABG, paroxysmal atrial fibrillation [place] vas 2 score of at least 3 on Eliquis, creatinine 2, hypertension, dyslipidemia, ischemic cardiomyopathy with a known EF of around 50%, PAD, DVT, DM type II who was recently admitted for ARF about 6 weeks ago comes back from assisted living facility with chief complaints of inability to pass urine and dysuria.  He was diagnosed with bladder obstruction, UTI, Foley was placed and he was admitted to the hospital.                                                                 Hospital Course    1.  Bladder outlet obstruction causing UTI with Sepsis.  Really suspicious for BPH, placed on Flomax and Foley initially, Foley was removed on 09/09/2018 has tolerated Foley removal well for 24 hours, continue Flomax upon discharge along with urology follow-up outpatient, urine cultures and blood cultures growing Enterobacter but he has showed good  differences on IV antibiotics here, no fever or leukocytosis, clinically bacteremia arising from UTI has defervesced.  He will get 4 more day supply of oral Cipro with probiotic combination as he is resistant to various other oral antibiotics, he will follow with urology and PCP within a week of discharge.  He is symptom-free back to baseline.  Did well with PT.  2.  Paroxysmal atrial fibrillation [place] vas 2 score of at least 4.  Continue Eliquis dosed by pharmacy, continue beta-blocker at home dose.  3.  History of DVT.  On Eliquis.  4.  Hypertension.  Continue beta-blocker at home dose and monitor.  5.  History of gout.  On allopurinol.  6.  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  Currently compensated no other acute issues.  7.  PAD.  Continue cilostazol.  8.  History of iron deficiency anemia.  Mild drop due to him dilution from IV  fluids, continue oral iron supplementation.  No signs of active acute bleed.  9.  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  Baseline creatinine around 1.9.  Improved with hydration and initial Foley placement, will be to monitor outpatient.  10. ? H/O DM type II.  Diet controlled.   Discharge diagnosis     Principal Problem:   Sepsis ([place]) Active Problems:   Diabetes mellitus, type 2 (HCC)   PAT (paroxysmal atrial tachycardia) (HCC)   CAD (coronary artery disease)   Essential hypertension   CKD (chronic kidney disease), symptom management only, stage 4 (severe) ([place])    Discharge instructions    Discharge Instructions    Diet - low sodium heart healthy   Complete by:  As directed    Discharge instructions   Complete by:  As directed    Follow with Primary MD Leonard Downing, MD in 7 days   Get CBC, BMP checked  by Primary MD n 5-7 days    Activity: As tolerated with Full fall precautions use walker/cane & assistance as needed  Disposition Home    Diet: Soft heart healthy diet with  aspiration precautions  Special Instructions: If you have smoked or chewed Tobacco  in the last 2 yrs please stop smoking, stop any regular Alcohol  and or any Recreational drug use.  On your next visit with your primary care physician please Get Medicines reviewed and adjusted.  Please request your Prim.MD to go over all Hospital Tests and Procedure/Radiological results at the follow up, please get all Hospital records sent to your Prim MD by signing hospital release before you go home.  If you experience worsening of your admission symptoms, develop shortness of breath, life threatening emergency, suicidal or homicidal thoughts you must seek medical attention immediately by calling 911 or calling your MD immediately  if symptoms less severe.  You Must read complete instructions/literature along with all the possible adverse reactions/side effects for all the Medicines you take and that have been prescribed to you. Take any new Medicines after you have completely understood and accpet all the possible adverse reactions/side effects.   Increase activity slowly   Complete by:  As directed       Discharge Medications   Allergies as of 09/10/2018      Reactions   Statins Other (See Comments)   Myalgias, muscle weakness      Medication List    TAKE these medications   allopurinol 100 MG tablet Commonly known as:  ZYLOPRIM Take 100 mg by mouth daily with breakfast. To prevent gout   apixaban 5 MG Tabs tablet Commonly known as:  ELIQUIS Take 1 tablet (5 mg total) by mouth 2 (two) times daily.   cilostazol 100 MG tablet Commonly known as:  PLETAL TAKE 1 TABLET TWICE A DAY   ciprofloxacin 500 MG tablet Commonly known as:  CIPRO Take 1 tablet (500 mg total) by mouth 2 (two) times daily for 4 days.   ferrous sulfate 325 (65 FE) MG tablet Take 325 mg by mouth daily with breakfast.   metoprolol tartrate 100 MG tablet Commonly known as:  LOPRESSOR Take 50 mg by mouth daily.     PROBIOTIC & ACIDOPHILUS EX ST Caps Take 1 capsule by mouth 2 (two) times daily. Switch to any p.o. twice daily 10-day supply probiotic   tamsulosin 0.4 MG Caps capsule Commonly known as:  FLOMAX Take 1 capsule (0.4 mg total) by mouth daily after supper.   vitamin B-12 500 MCG tablet  Commonly known as:  CYANOCOBALAMIN Take 500 mcg by mouth daily.       Follow-up Information    Leonard Downing, MD. Schedule an appointment as soon as possible for a visit in 1 week(s).   Specialty:  Family Medicine Contact information: [place] [address] [phone]        Alexis Frock, MD. Schedule an appointment as soon as possible for a visit in 1 week(s).   Specialty:  Urology Why:  Recurrent urinary retention and UTI Contact information: [place] [address] [phone]           Major procedures and Radiology Reports - PLEASE review detailed and final reports thoroughly  -      US Renal  Result Date: 09/08/2018 CLINICAL DATA:  Acute renal failure. EXAM: RENAL / URINARY TRACT ULTRASOUND COMPLETE COMPARISON:  All renal ultrasound of July 24, 2018 FINDINGS: Right Kidney: Renal measurements: 11.9 x 5.4 x 5.9 cm = volume: 199 mL . Echogenicity within normal limits. No mass or hydronephrosis visualized. Left Kidney: Renal measurements: 12.9 x 6.9 x 5.5 cm = volume: 256 mL. Echogenicity within normal limits. No mass or hydronephrosis visualized. Bladder: The urinary bladder is decompressed by a Foley catheter. IMPRESSION: Normal renal ultrasound examination. Electronically Signed   By: David  Martinique M.D.   On: 09/08/2018 10:05   Dg Chest Portable 1 View  Result Date: 09/07/2018 CLINICAL DATA:  Fever, sepsis, UTI. Hx of coronary artery disease, hypertension, ischemic cardiomyopathy, paroxysmal afib, right bundle branch block, diabetes, CABG(2013), cardiac catheterization. Former smoker(1980). EXAM: PORTABLE CHEST 1 VIEW COMPARISON:   07/24/2018 FINDINGS: Median sternotomy and CABG. Heart size is normal. There are no focal consolidations or pleural effusions. No pulmonary edema. There is atherosclerotic calcification of the thoracic aorta IMPRESSION: Postoperative changes. No evidence for acute cardiopulmonary abnormality. Aortic atherosclerosis.  (ICD10-I70.0) Electronically Signed   By: Nolon Nations M.D.   On: 09/07/2018 09:44    Micro Results     Recent Results (from the past 240 hour(s))  Blood Culture (routine x 2)     Status: None (Preliminary result)   Collection Time: 09/07/18  9:05 AM  Result Value Ref Range Status   Specimen Description BLOOD RIGHT ANTECUBITAL  Final   Special Requests   Final    BOTTLES DRAWN AEROBIC AND ANAEROBIC Blood Culture adequate volume   Culture   Final    NO GROWTH 2 DAYS Performed at [facility], [address] [place], [place] [address]    Report Status PENDING  Incomplete  Blood Culture (routine x 2)     Status: None (Preliminary result)   Collection Time: 09/07/18  9:08 AM  Result Value Ref Range Status   Specimen Description BLOOD SITE NOT SPECIFIED  Final   Special Requests   Final    BOTTLES DRAWN AEROBIC AND ANAEROBIC Blood Culture adequate volume   Culture  Setup Time   Final    GRAM NEGATIVE RODS ANAEROBIC BOTTLE ONLY CRITICAL RESULT CALLED TO, READ BACK BY AND VERIFIED WITH: PHARMD LORA S [phone] FCP Performed at [facility], [place] [address], [place], [place] [address]    Culture GRAM NEGATIVE RODS  Final   Report Status PENDING  Incomplete  Blood Culture ID Panel (Reflexed)     Status: Abnormal   Collection Time: 09/07/18  9:08 AM  Result Value Ref Range Status   Enterococcus species NOT DETECTED NOT DETECTED Final   Listeria  monocytogenes NOT DETECTED NOT DETECTED Final   Staphylococcus species NOT DETECTED NOT DETECTED Final   Staphylococcus aureus (BCID) NOT DETECTED NOT DETECTED Final   Streptococcus species NOT DETECTED NOT DETECTED  Final   Streptococcus agalactiae NOT DETECTED NOT DETECTED Final   Streptococcus pneumoniae NOT DETECTED NOT DETECTED Final   Streptococcus pyogenes NOT DETECTED NOT DETECTED Final   Acinetobacter baumannii NOT DETECTED NOT DETECTED Final   Enterobacteriaceae species DETECTED (A) NOT DETECTED Final    Comment: Enterobacteriaceae represent a large family of gram-negative bacteria, not a single organism. CRITICAL RESULT CALLED TO, READ BACK BY AND VERIFIED WITH: PHARMD LORA S [phone] FCP    Enterobacter cloacae complex DETECTED (A) NOT DETECTED Final    Comment: CRITICAL RESULT CALLED TO, READ BACK BY AND VERIFIED WITH: PHARMD LORA S [phone] FCP    Escherichia coli NOT DETECTED NOT DETECTED Final   Klebsiella oxytoca NOT DETECTED NOT DETECTED Final   Klebsiella pneumoniae NOT DETECTED NOT DETECTED Final   Proteus species NOT DETECTED NOT DETECTED Final   Serratia marcescens NOT DETECTED NOT DETECTED Final   Carbapenem resistance NOT DETECTED NOT DETECTED Final   Haemophilus influenzae NOT DETECTED NOT DETECTED Final   Neisseria meningitidis NOT DETECTED NOT DETECTED Final   Pseudomonas aeruginosa NOT DETECTED NOT DETECTED Final   Candida albicans NOT DETECTED NOT DETECTED Final   Candida glabrata NOT DETECTED NOT DETECTED Final   Candida krusei NOT DETECTED NOT DETECTED Final   Candida parapsilosis NOT DETECTED NOT DETECTED Final   Candida tropicalis NOT DETECTED NOT DETECTED Final    Comment: Performed at [facility], [place]. [address], [place], [place] [address]  Urine culture     Status: Abnormal   Collection Time: 09/07/18 12:54 PM  Result Value Ref Range Status   Specimen Description URINE, RANDOM  Final   Special Requests   Final    NONE Performed at [facility], [place] [address], [place], [place] [address]    Culture 80,000 COLONIES/mL ENTEROBACTER SPECIES (A)  Final   Report Status 09/09/2018 FINAL  Final   Organism ID, Bacteria ENTEROBACTER SPECIES (A)   Final      Susceptibility   Enterobacter species - MIC*    CEFAZOLIN >=64 RESISTANT Resistant     CEFTRIAXONE 16 INTERMEDIATE Intermediate     CIPROFLOXACIN <=0.25 SENSITIVE Sensitive     GENTAMICIN <=1 SENSITIVE Sensitive     IMIPENEM <=0.25 SENSITIVE Sensitive     NITROFURANTOIN 64 INTERMEDIATE Intermediate     TRIMETH/SULFA <=20 SENSITIVE Sensitive     PIP/TAZO >=128 RESISTANT Resistant     * 80,000 COLONIES/mL ENTEROBACTER SPECIES  MRSA PCR Screening     Status: None   Collection Time: 09/07/18  3:35 PM  Result Value Ref Range Status   MRSA by PCR NEGATIVE NEGATIVE Final    Comment:        The GeneXpert MRSA Assay (FDA approved for NASAL specimens only), is one component of a comprehensive MRSA colonization surveillance program. It is not intended to diagnose MRSA infection nor to guide or monitor treatment for MRSA infections. Performed at [facility], [place] [address], [place], [place] [address]   C difficile quick scan w PCR reflex     Status: None   Collection Time: 09/07/18  6:20 PM  Result Value Ref Range Status   C Diff antigen NEGATIVE NEGATIVE Final   C Diff toxin NEGATIVE NEGATIVE Final   C Diff interpretation No C.  difficile detected.  Final    Comment: Performed at [facility], [place] [address] [place], [place] [address]    Today   Subjective    Detrick Dani today has no headache,no chest abdominal pain,no new weakness tingling or numbness, feels much better wants to go home today.   Objective   Blood pressure (!) 166/69, pulse 82, temperature 97.9 F (36.6 C), temperature source Oral, resp. rate 19, height 5\' 9"  (1.753 m), weight 86.2 kg, SpO2 91 %.   Intake/Output Summary (Last 24 hours) at 09/10/2018 0906 Last data filed at 09/10/2018 0737 Gross per 24 hour  Intake 1650 ml  Output 1730 ml  Net -80 ml    Exam Awake Alert, Oriented x 3, No new F.N deficits, Normal affect [place].AT,PERRAL Supple Neck,No JVD, No cervical lymphadenopathy  appriciated.  Symmetrical Chest wall movement, Good air movement bilaterally, CTAB RRR,No Gallops,Rubs or new Murmurs, No Parasternal Heave +ve B.Sounds, Abd Soft, Non tender, No organomegaly appriciated, No rebound -guarding or rigidity. No Cyanosis, Clubbing or edema, No new Rash or bruise   Data Review   CBC w Diff:  Lab Results  Component Value Date   WBC 5.6 09/10/2018   HGB 8.3 (L) 09/10/2018   HGB 9.9 (L) 07/24/2018   HCT 24.5 (L) 09/10/2018   PLT 81 (L) 09/10/2018   PLT 128 (L) 07/24/2018   LYMPHOPCT 4 09/07/2018   MONOPCT 1 09/07/2018   EOSPCT 0 09/07/2018   BASOPCT 0 09/07/2018    CMP:  Lab Results  Component Value Date   NA 135 09/10/2018   K 3.6 09/10/2018   CL 104 09/10/2018   CO2 21 (L) 09/10/2018   BUN 14 09/10/2018   CREATININE 1.35 (H) 09/10/2018   PROT 6.2 (L) 09/07/2018   ALBUMIN 3.2 (L) 09/07/2018   BILITOT 1.2 09/07/2018   ALKPHOS 56 09/07/2018   AST 22 09/07/2018   ALT 11 09/07/2018  .   Total Time in preparing paper work, data evaluation and todays exam - 75 minutes  Lala Lund M.D on 09/10/2018 at 9:06 AM  [facility]   Office  [phone]

## 2018-09-10 NOTE — Progress Notes (Addendum)
Patient states that he has not had anymore episodes, and denies complaints at this time. Patient states that he did have some strange dreams. PVR at most 535 ml. Patient still denies pain/ discomfort. Patient stated that he does not need to urinate anymore. Patient states that he just needs to be put on the right medication. Patient states that he might agree to temporary cath after consideration, but will not be discharged home with one. Patient refusing cath if ordered at this time though. Bodenheimer, NP notified. Will continue to monitor.

## 2018-09-10 NOTE — Progress Notes (Addendum)
Patient called RN in room c/o SOB. When this RN entered room, patient resting in bed on RA, no obvious distress. Patient stated that he had not fallen asleep since bladder scan at [place]. BP 161/71, SpO2 96% on RA. HOB elevated to 25 degrees. Patient states that approximately 30 minutes ago, he became SOB, felt heaviness medial chest, and had a sharp pain medial forehead. Patient states this episode lasted a few seconds, he would feel fine, then this would happen again for a few seconds. This was intermittent for approximately 30 minutes. While RN in patient's room, he denied SOB, chest heaviness, and pain. Patient then stated that maybe he was dreaming, he is unsure. Patient encouraged to notify staff if this occurs again. Bodenheimer, NP notified via telephone. Per Silas [place], NP continue to monitor, notify him if this occurs again. Bodenheimer, NP aware that patient is not on tele. Will continue to monitor.

## 2018-09-10 NOTE — Progress Notes (Addendum)
CSW spoke with Linton Rump at [facility] concerning pt's return there.  [facility] will not need anything for disposition for return. No further needs at this time.  CSW signing off.  Reed Breech LCSWA [phone]

## 2018-09-10 NOTE — Progress Notes (Signed)
[name] to be D/C'd Home per MD order.  Discussed with the patient and all questions fully answered.  VSS, Skin clean, dry and intact without evidence of skin break down, no evidence of skin tears noted. IV catheter discontinued intact. Site without signs and symptoms of complications. Dressing and pressure applied.  An After Visit Summary was printed, reviewed and given to the patient and patient's son. Patient received prescriptions.   D/c education completed with patient/family including follow up instructions, medication list, d/c activities limitations if indicated, with other d/c instructions as indicated by MD - patient able to verbalize understanding, all questions fully answered.   Patient instructed to return to ED, call 911, or call MD for any changes in condition.   Patient escorted via [facility] by staff nurse, and D/C home via private auto.  Howard Pouch 09/10/2018 2:49 PM

## 2018-09-11 LAB — CULTURE, BLOOD (ROUTINE X 2): Special Requests: ADEQUATE

## 2018-09-12 LAB — CULTURE, BLOOD (ROUTINE X 2)
CULTURE: NO GROWTH
SPECIAL REQUESTS: ADEQUATE

## 2018-09-29 ENCOUNTER — Other Ambulatory Visit: Payer: Self-pay

## 2018-09-29 MED ORDER — CILOSTAZOL 100 MG PO TABS: 100.0000 mg | ORAL_TABLET | Freq: Two times a day (BID) | ORAL | 3 refills | Status: DC

## 2018-09-29 NOTE — Addendum Note (Signed)
Addended by: Jacqulynn Cadet on: 09/29/2018 09:19 AM   Modules accepted: Orders

## 2018-12-15 ENCOUNTER — Other Ambulatory Visit: Payer: Self-pay

## 2018-12-15 ENCOUNTER — Inpatient Hospital Stay (HOSPITAL_COMMUNITY)
Admission: EM | Admit: 2018-12-15 | Discharge: 2018-12-19 | DRG: 287 | Disposition: A | Discharge status: Home / Self Care | Payer: Medicare Other | Attending: Interventional Cardiology | Admitting: Interventional Cardiology

## 2018-12-15 ENCOUNTER — Emergency Department (HOSPITAL_COMMUNITY): Payer: Medicare Other

## 2018-12-15 ENCOUNTER — Encounter (HOSPITAL_COMMUNITY): Payer: Self-pay | Admitting: Emergency Medicine

## 2018-12-15 ENCOUNTER — Ambulatory Visit (HOSPITAL_COMMUNITY): Payer: Medicare Other

## 2018-12-15 DIAGNOSIS — I4510 Unspecified right bundle-branch block: Secondary | ICD-10-CM | POA: Diagnosis present

## 2018-12-15 DIAGNOSIS — E1151 Type 2 diabetes mellitus with diabetic peripheral angiopathy without gangrene: Secondary | ICD-10-CM | POA: Diagnosis present

## 2018-12-15 DIAGNOSIS — I4891 Unspecified atrial fibrillation: Secondary | ICD-10-CM

## 2018-12-15 DIAGNOSIS — I2510 Atherosclerotic heart disease of native coronary artery without angina pectoris: Secondary | ICD-10-CM | POA: Diagnosis present

## 2018-12-15 DIAGNOSIS — Z79899 Other long term (current) drug therapy: Secondary | ICD-10-CM

## 2018-12-15 DIAGNOSIS — I209 Angina pectoris, unspecified: Secondary | ICD-10-CM

## 2018-12-15 DIAGNOSIS — I160 Hypertensive urgency: Secondary | ICD-10-CM

## 2018-12-15 DIAGNOSIS — E785 Hyperlipidemia, unspecified: Secondary | ICD-10-CM | POA: Diagnosis present

## 2018-12-15 DIAGNOSIS — Z888 Allergy status to other drugs, medicaments and biological substances status: Secondary | ICD-10-CM | POA: Diagnosis not present

## 2018-12-15 DIAGNOSIS — I25119 Atherosclerotic heart disease of native coronary artery with unspecified angina pectoris: Secondary | ICD-10-CM | POA: Diagnosis not present

## 2018-12-15 DIAGNOSIS — I25110 Atherosclerotic heart disease of native coronary artery with unstable angina pectoris: Secondary | ICD-10-CM | POA: Diagnosis present

## 2018-12-15 DIAGNOSIS — E782 Mixed hyperlipidemia: Secondary | ICD-10-CM | POA: Diagnosis not present

## 2018-12-15 DIAGNOSIS — Z7901 Long term (current) use of anticoagulants: Secondary | ICD-10-CM

## 2018-12-15 DIAGNOSIS — R0789 Other chest pain: Secondary | ICD-10-CM | POA: Diagnosis present

## 2018-12-15 DIAGNOSIS — N183 Chronic kidney disease, stage 3 (moderate): Secondary | ICD-10-CM | POA: Diagnosis present

## 2018-12-15 DIAGNOSIS — Z955 Presence of coronary angioplasty implant and graft: Secondary | ICD-10-CM | POA: Diagnosis not present

## 2018-12-15 DIAGNOSIS — N184 Chronic kidney disease, stage 4 (severe): Secondary | ICD-10-CM | POA: Diagnosis not present

## 2018-12-15 DIAGNOSIS — I480 Paroxysmal atrial fibrillation: Secondary | ICD-10-CM | POA: Diagnosis present

## 2018-12-15 DIAGNOSIS — I214 Non-ST elevation (NSTEMI) myocardial infarction: Secondary | ICD-10-CM | POA: Diagnosis not present

## 2018-12-15 DIAGNOSIS — I25700 Atherosclerosis of coronary artery bypass graft(s), unspecified, with unstable angina pectoris: Secondary | ICD-10-CM

## 2018-12-15 DIAGNOSIS — I255 Ischemic cardiomyopathy: Secondary | ICD-10-CM | POA: Diagnosis present

## 2018-12-15 DIAGNOSIS — Z87891 Personal history of nicotine dependence: Secondary | ICD-10-CM | POA: Diagnosis not present

## 2018-12-15 DIAGNOSIS — I129 Hypertensive chronic kidney disease with stage 1 through stage 4 chronic kidney disease, or unspecified chronic kidney disease: Secondary | ICD-10-CM | POA: Diagnosis present

## 2018-12-15 DIAGNOSIS — I340 Nonrheumatic mitral (valve) insufficiency: Secondary | ICD-10-CM | POA: Diagnosis not present

## 2018-12-15 DIAGNOSIS — E1122 Type 2 diabetes mellitus with diabetic chronic kidney disease: Secondary | ICD-10-CM | POA: Diagnosis present

## 2018-12-15 DIAGNOSIS — I25118 Atherosclerotic heart disease of native coronary artery with other forms of angina pectoris: Secondary | ICD-10-CM | POA: Diagnosis not present

## 2018-12-15 HISTORY — DX: Unspecified atrial fibrillation: I48.91

## 2018-12-15 LAB — COMPREHENSIVE METABOLIC PANEL
ALK PHOS: 40 U/L (ref 38–126)
ALT: 14 U/L (ref 0–44)
AST: 20 U/L (ref 15–41)
Albumin: 3.9 g/dL (ref 3.5–5.0)
Anion gap: 9 (ref 5–15)
BILIRUBIN TOTAL: 1.4 mg/dL — AB (ref 0.3–1.2)
BUN: 13 mg/dL (ref 8–23)
CALCIUM: 9 mg/dL (ref 8.9–10.3)
CO2: 26 mmol/L (ref 22–32)
CREATININE: 1.51 mg/dL — AB (ref 0.61–1.24)
Chloride: 103 mmol/L (ref 98–111)
GFR, EST AFRICAN AMERICAN: 50 mL/min — AB (ref >60)
GFR, EST NON AFRICAN AMERICAN: 43 mL/min — AB (ref >60)
Glucose, Bld: 164 mg/dL — ABNORMAL HIGH (ref 70–99)
Potassium: 4 mmol/L (ref 3.5–5.1)
Sodium: 138 mmol/L (ref 135–145)
TOTAL PROTEIN: 7.2 g/dL (ref 6.5–8.1)

## 2018-12-15 LAB — URINALYSIS, ROUTINE W REFLEX MICROSCOPIC
BACTERIA UA: NONE SEEN
BILIRUBIN URINE: NEGATIVE
Glucose, UA: NEGATIVE mg/dL
Hgb urine dipstick: NEGATIVE
KETONES UR: NEGATIVE mg/dL
LEUKOCYTE UA: NEGATIVE
Nitrite: POSITIVE — AB
PH: 7 (ref 5.0–8.0)
PROTEIN: 30 mg/dL — AB
Specific Gravity, Urine: 1.006 (ref 1.005–1.030)

## 2018-12-15 LAB — APTT
aPTT: 28 seconds (ref 24–36)
aPTT: 30 seconds (ref 24–36)

## 2018-12-15 LAB — CBC
HEMATOCRIT: 39.7 % (ref 39.0–52.0)
HEMOGLOBIN: 13.4 g/dL (ref 13.0–17.0)
MCH: 33.3 pg (ref 26.0–34.0)
MCHC: 33.8 g/dL (ref 30.0–36.0)
MCV: 98.5 fL (ref 80.0–100.0)
PLATELETS: 120 10*3/uL — AB (ref 150–400)
RBC: 4.03 MIL/uL — AB (ref 4.22–5.81)
RDW: 13.7 % (ref 11.5–15.5)
WBC: 5.7 10*3/uL (ref 4.0–10.5)
nRBC: 0 % (ref 0.0–0.2)

## 2018-12-15 LAB — ECHOCARDIOGRAM COMPLETE

## 2018-12-15 LAB — TROPONIN I: Troponin I: <0.03 ng/mL (ref <0.03)

## 2018-12-15 LAB — I-STAT TROPONIN, ED: Troponin i, poc: 0.02 ng/mL (ref 0.00–0.08)

## 2018-12-15 LAB — MAGNESIUM: MAGNESIUM: 1.7 mg/dL (ref 1.7–2.4)

## 2018-12-15 LAB — TSH: TSH: 2.241 u[IU]/mL (ref 0.350–4.500)

## 2018-12-15 LAB — PHOSPHORUS: PHOSPHORUS: 3.7 mg/dL (ref 2.5–4.6)

## 2018-12-15 LAB — BRAIN NATRIURETIC PEPTIDE: B Natriuretic Peptide: 289.5 pg/mL — ABNORMAL HIGH (ref 0.0–100.0)

## 2018-12-15 MED ORDER — METOPROLOL TARTRATE 50 MG PO TABS
50.0000 mg | ORAL_TABLET | Freq: Two times a day (BID) | ORAL | Status: DC
  Administered 2018-12-15 – 2018-12-17 (×5): 50 mg via ORAL
  Filled 2018-12-15 (×6): qty 1

## 2018-12-15 MED ORDER — ONDANSETRON HCL 4 MG PO TABS: 4.0000 mg | ORAL_TABLET | Freq: Four times a day (QID) | ORAL | Status: DC | PRN

## 2018-12-15 MED ORDER — APIXABAN 5 MG PO TABS: 5.0000 mg | ORAL_TABLET | Freq: Two times a day (BID) | ORAL | Status: DC

## 2018-12-15 MED ORDER — CILOSTAZOL 100 MG PO TABS
100.0000 mg | ORAL_TABLET | Freq: Two times a day (BID) | ORAL | Status: DC
  Administered 2018-12-15 – 2018-12-17 (×5): 100 mg via ORAL
  Filled 2018-12-15 (×6): qty 1

## 2018-12-15 MED ORDER — DOCUSATE SODIUM 100 MG PO CAPS
100.0000 mg | ORAL_CAPSULE | Freq: Every day | ORAL | Status: DC
  Administered 2018-12-15 – 2018-12-18 (×4): 100 mg via ORAL
  Filled 2018-12-15 (×4): qty 1

## 2018-12-15 MED ORDER — ONDANSETRON HCL 4 MG/2ML IJ SOLN: 4.0000 mg | Freq: Four times a day (QID) | INTRAMUSCULAR | Status: DC | PRN

## 2018-12-15 MED ORDER — ACETAMINOPHEN 650 MG RE SUPP: 650.0000 mg | Freq: Four times a day (QID) | RECTAL | Status: DC | PRN

## 2018-12-15 MED ORDER — HEPARIN (PORCINE) 25000 UT/250ML-% IV SOLN
1750.0000 [IU]/h | INTRAVENOUS | Status: DC
  Administered 2018-12-15: 1200 [IU]/h via INTRAVENOUS
  Administered 2018-12-16: 1450 [IU]/h via INTRAVENOUS
  Administered 2018-12-16 – 2018-12-18 (×3): 1750 [IU]/h via INTRAVENOUS
  Filled 2018-12-15 (×5): qty 250

## 2018-12-15 MED ORDER — VITAMIN B-12 100 MCG PO TABS
500.0000 ug | ORAL_TABLET | Freq: Every day | ORAL | Status: DC
  Administered 2018-12-15: 500 ug via ORAL
  Administered 2018-12-16: 100 ug via ORAL
  Administered 2018-12-17 – 2018-12-18 (×2): 500 ug via ORAL
  Filled 2018-12-15 (×2): qty 5
  Filled 2018-12-15: qty 1
  Filled 2018-12-15: qty 5

## 2018-12-15 MED ORDER — ISOSORBIDE MONONITRATE ER 30 MG PO TB24
30.0000 mg | ORAL_TABLET | Freq: Every day | ORAL | Status: DC
  Administered 2018-12-15 – 2018-12-18 (×4): 30 mg via ORAL
  Filled 2018-12-15 (×4): qty 1

## 2018-12-15 MED ORDER — TAMSULOSIN HCL 0.4 MG PO CAPS
0.4000 mg | ORAL_CAPSULE | Freq: Every day | ORAL | Status: DC
  Administered 2018-12-15 – 2018-12-18 (×4): 0.4 mg via ORAL
  Filled 2018-12-15 (×4): qty 1

## 2018-12-15 MED ORDER — ACETAMINOPHEN 325 MG PO TABS: 650.0000 mg | ORAL_TABLET | Freq: Four times a day (QID) | ORAL | Status: DC | PRN

## 2018-12-15 MED ORDER — AMLODIPINE BESYLATE 10 MG PO TABS
10.0000 mg | ORAL_TABLET | Freq: Every day | ORAL | Status: DC
  Administered 2018-12-15 – 2018-12-18 (×4): 10 mg via ORAL
  Filled 2018-12-15: qty 1
  Filled 2018-12-15: qty 2
  Filled 2018-12-15 (×2): qty 1

## 2018-12-15 MED ORDER — FERROUS SULFATE 325 (65 FE) MG PO TABS
325.0000 mg | ORAL_TABLET | Freq: Every day | ORAL | Status: DC
  Administered 2018-12-16 – 2018-12-17 (×2): 325 mg via ORAL
  Filled 2018-12-15 (×4): qty 1

## 2018-12-15 MED ORDER — METOPROLOL TARTRATE 50 MG PO TABS: 50.0000 mg | ORAL_TABLET | Freq: Every evening | ORAL | Status: DC

## 2018-12-15 NOTE — ED Notes (Signed)
Waiting for room to be clean on floor

## 2018-12-15 NOTE — Progress Notes (Signed)
Patient transferred from ED.  NPO since last night.  BP 169/90, HR 66. Cardiology team C notified.

## 2018-12-15 NOTE — Consult Note (Addendum)
The patient has been seen in conjunction with Ina Homes, MD. All aspects of care have been considered and discussed. The patient has been personally interviewed, examined, and all clinical data has been reviewed.   Sudden onset of recurring chest tightness, dyspnea, and palpitations occurring earlier this morning before breakfast or any medications.  Episodes will be sudden onset and then dissipate.  After multiple recurrences of discomfort he came to the emergency room for evaluation.  EKG rhythm strip by EMS demonstrate self terminating episodes of atrial fibrillation with rapid ventricular response.  At the time of exam he is asymptomatic.  Blood pressures been extremely elevated, likely related to not having had medications yet this morning.  He will have a 2D Doppler echocardiogram to assess LV function, serial markers to rule out myocardial infarction, be placed on telemetry to determine if he is having recurrent A. fib as the source of his discomfort.  Will likely need to have an ischemic evaluation to rule out bypass graft failure.  Will hold apixaban and start IV heparin in case the patient needs coronary angiography.    CARDIOLOGY H&P   Patient ID: Damyon Mullane.; [id number]; Apr 13, 1941   Admit date: 12/15/2018 Date of Consult: 12/15/2018  Primary Care Provider: Leonard Downing, MD Primary Cardiologist: Dr. Peter Martinique  Primary Electrophysiologist:  None   Patient Profile:   [name] is a 78 y.o. male with a hx of CAD s/p CABG x 4 in 2013, PAF on Eliquis, HTN, DM, PAD, and HLD who is being seen today for the evaluation of NSVT.  History of Present Illness:   Mr. Leece was in his usual state of health until he woke up this morning and was preparing to go down for breakfast. When he started to ambulate he began to feel substernal chest pressure, shortness of breath, and facial flushing. He subsequently sat down and his symptoms subsided. Over the course  of the next 2 to 3 hours with any type of movement he had recurrence of the symptoms. He finally told the assisted living facility to call the ambulance. He did have bypass in 2013 but states that he has never had chest pressure like this before. And since 2013 has had no issues with his heart. He is typically able to ambulate without any kind of chest discomfort or shortness of breath he is more limited by pain in his LEs.  Upon EMS arrival they found him to be resting comfortably in no acute distress. His blood pressure was found to be 217/112. He was hooked up to cardiac monitoring where he had a couple runs of non-sustained ventricular tachycardia. It does not appear that he was symptomatic during these episodes as he states he has been asymptomatic since just prior to EMS arrival. In the emergency department he was found to be hypertensive and EKG illustrated sinus rhythm with a right bundle branch block and frequent PVCs. These EKGs are stable compared to prior.  He denies any recent fevers, chills, headaches, rhinorrhea, cough, shortness of breath, abdominal pain, nausea/vomiting, diarrhea, constipation, myalgias, arthralgias, worsening lower extremity edema, increased abdominal girth, PND, orthopnea.  Past Medical History:  Diagnosis Date  . CAD (coronary artery disease)    a. s/p CABG x 29 Nov 2011 with LIMA to LAD, SVG to OM1 and distal LCX, SVG to ramus intermediate  . Essential hypertension   . Hyperlipidemia   . Intermittent claudication ([place])   . Ischemic cardiomyopathy    a.  03/2015 Echo: EF45-50%, Gr1 DD, mild MR.  Marland Kitchen PAF (paroxysmal atrial fibrillation) ([place])    a. post op atrial fib 11/2011; short course of amiodarone; stopped 01/25/12  . Peripheral arterial disease ([place])   . Right bundle branch block   . Type II diabetes mellitus ([place])    Past Surgical History:  Procedure Laterality Date  . CARDIAC CATHETERIZATION  01/16/1999   Est. EF of 65% -- Nonobstruction atherosclerotic  coronary artery disease -- Normal left ventricular function      . CARDIAC ELECTROPHYSIOLOGY STUDY AND ABLATION  05/21/1999   Normal sinus funtion -- Mildly prolonged interatrial conduction times -- Normal A-V node funtion -- Normal His Purkinje system function -- fNo accessory pathway -- No inducible ventricular tachycardia in the presence of the or in the absence of isoproterenol with programmed stimulation or with burst pacing -- Nikki Dom, M.D. FACC surgan          . CHOLECYSTECTOMY  1999  . CORONARY ARTERY BYPASS GRAFT  12/10/2011   Procedure: CORONARY ARTERY BYPASS GRAFTING (CABG);  Surgeon: Tharon Aquas Adelene Idler, MD;  Location: [facility];  Service: Open Heart Surgery;  Laterality: N/A;  Coronary Artery bypass graft on pump times four utilizing left internal mammary artery and left saphenous vein harvested endoscopically  . LEFT HEART CATHETERIZATION WITH CORONARY ANGIOGRAM N/A 12/09/2011   Procedure: LEFT HEART CATHETERIZATION WITH CORONARY ANGIOGRAM;  Surgeon: Peter M Martinique, MD;  Location: [facility];  Service: Cardiovascular;  Laterality: N/A;  . LOWER EXTREMITY ANGIOGRAM Left 07/28/2018   Procedure: Lower Extremity Angiogram;  Surgeon: Angelia Mould, MD;  Location: [facility] CV LAB;  Service: Cardiovascular;  Laterality: Left;    Home Medications:  Prior to Admission medications   Medication Sig Start Date End Date Taking? Authorizing Provider  allopurinol (ZYLOPRIM) 100 MG tablet Take 100 mg by mouth daily with breakfast. To prevent gout 07/18/18  Yes [provider]  apixaban (ELIQUIS) 5 MG TABS tablet Take 1 tablet (5 mg total) by mouth 2 (two) times daily. 07/29/18  Yes Helberg, Larkin Ina, MD  cilostazol (PLETAL) 100 MG tablet Take 1 tablet (100 mg total) by mouth 2 (two) times daily. 09/29/18  Yes Martinique, Peter M, MD  docusate sodium (COLACE) 100 MG capsule Take 100 mg by mouth daily.   Yes [provider]  ferrous sulfate 325 (65 FE) MG tablet Take 325 mg  by mouth daily with breakfast.   Yes [provider]  metoprolol tartrate (LOPRESSOR) 100 MG tablet Take 50 mg by mouth every evening.    Yes [provider]  Probiotic Product (PROBIOTIC & ACIDOPHILUS EX ST) CAPS Take 1 capsule by mouth 2 (two) times daily. Switch to any p.o. twice daily 10-day supply probiotic Patient taking differently: Take 1 capsule by mouth 2 (two) times daily.  09/10/18  Yes Thurnell Lose, MD  vitamin B-12 (CYANOCOBALAMIN) 500 MCG tablet Take 500 mcg by mouth daily.   Yes [provider]  tamsulosin (FLOMAX) 0.4 MG CAPS capsule Take 0.4 mg by mouth at bedtime. 11/01/18   [provider]  furosemide (LASIX) 40 MG tablet Take 1 tablet (40 mg total) by mouth daily. For 4 days then stop. 12/15/11 01/05/12  Nani Skillern, PA-C   Inpatient Medications: Scheduled Meds:  Continuous Infusions:  PRN Meds:  Allergies:    Allergies  Allergen Reactions  . Statins Other (See Comments)    Myalgias, muscle weakness   Social History:   Social History  Socioeconomic History  . Marital status: Widowed    Spouse name: Not on file  . Number of children: 1  . Years of education: Not on file  . Highest education level: Not on file  Occupational History  . 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 Financial resource strain: Not on file  . Food insecurity:    Worry: Not on file    Inability: Not on file  . Transportation needs:    Medical: Not on file    Non-medical: Not on file  Tobacco Use  . Smoking status: Former Smoker    Packs/day: 1.50    Years: 20.00    Pack years: 30.00    Types: Cigarettes    Last attempt to quit: 05/27/1979    Years since quitting: 39.5  . Smokeless tobacco: Former Network engineer and Sexual Activity  . Alcohol use: Yes    Alcohol/week: 4.0 standard drinks    Types: 4 Shots of liquor per week    Comment: 4 "canadian clubs each night"  . Drug use: No  . Sexual activity:  Yes  Lifestyle  . Physical activity:    Days per week: Not on file    Minutes per session: Not on file  . Stress: Not on file  Relationships  . Social connections:    Talks on phone: Not on file    Gets together: Not on file    Attends religious service: Not on file    Active member of club or organization: Not on file    Attends meetings of clubs or organizations: Not on file    Relationship status: Not on file  . Intimate partner violence:    Fear of current or ex partner: Not on file    Emotionally abused: Not on file    Physically abused: Not on file    Forced sexual activity: Not on file  Other Topics Concern  . Not on file  Social History Narrative  . Not on file    Family History:   Family History  Problem Relation Age of Onset  . Stroke Mother 44       Cerebellar hemorrhage    ROS:  Performed and all others negative.  Physical Exam/Data:   Vitals:   12/15/18 0815 12/15/18 0846 12/15/18 1031  BP: (!) 191/121 (!) 180/98 (!) 185/94  Pulse: 99 69 79  Resp: (!) 21 17 (!) 21  Temp: 98.4 F (36.9 C)    TempSrc: Oral    SpO2: 98% 98% 97%   No intake or output data in the 24 hours ending 12/15/18 1105 There were no vitals filed for this visit. There is no height or weight on file to calculate BMI.   General:  Well nourished, well developed, in no acute distress HEENT: Normal Lymph: No adenopathy Neck: No JVD Endocrine:  No thryomegaly Vascular: No carotid bruits; FA pulses 2+ bilaterally without bruits  Cardiac:  Normal S1, S2; RRR; no murmur  Lungs:  Clear to auscultation bilaterally, no wheezing, rhonchi or rales  Abd: Soft, nontender, no hepatomegaly  Ext: Mild RLE edema around the ankle Musculoskeletal:  No deformities, BUE and BLE strength normal and equal Skin: Warm and dry  Neuro:  CNs 2-12 intact, no focal abnormalities noted Psych:  Normal affect   EKG:  The EKG was personally reviewed and demonstrates: NSR with RBBB and some PVCs  Telemetry:   Telemetry was personally reviewed and demonstrates: Episode of NSVT  Relevant CV Studies:  TTE 03/31/2018  - Left ventricle: Technically difficult study. Significant   hypokinesis of the inferior wall. EF 45-50%. Doppler parameters   are consistent with abnormal left ventricular relaxation (grade 1   diastolic dysfunction). - Mitral valve: There was mild regurgitation. - Right ventricle: RV is poorly seen. TAPSE is normal. I suspect RV   is OK.  Laboratory Data:  Chemistry Recent Labs  Lab 12/15/18 0826  NA 138  K 4.0  CL 103  CO2 26  GLUCOSE 164*  BUN 13  CREATININE 1.51*  CALCIUM 9.0  GFRNONAA 43*  GFRAA 50*  ANIONGAP 9    Recent Labs  Lab 12/15/18 0826  PROT 7.2  ALBUMIN 3.9  AST 20  ALT 14  ALKPHOS 40  BILITOT 1.4*   Hematology Recent Labs  Lab 12/15/18 0826  WBC 5.7  RBC 4.03*  HGB 13.4  HCT 39.7  MCV 98.5  MCH 33.3  MCHC 33.8  RDW 13.7  PLT 120*   Cardiac Enzymes Recent Labs  Lab 12/15/18 0826  TROPONINI <0.03   No results for input(s): TROPIPOC in the last 168 hours.   BNPNo results for input(s): BNP, PROBNP in the last 168 hours.   DDimer No results for input(s): DDIMER in the last 168 hours.  Radiology/Studies:  Dg Chest 2 View  Result Date: 12/15/2018 CLINICAL DATA:  Hot flash and chest pain EXAM: CHEST - 2 VIEW COMPARISON:  September 07, 2018 FINDINGS: The heart size and mediastinal contours are stable. There is no focal infiltrate, pulmonary edema, or pleural effusion. Mild atelectasis is identified in the right lung base. The visualized skeletal structures are unremarkable. IMPRESSION: No focal pneumonia or pulmonary edema. Mild atelectasis of right lung base. Electronically Signed   By: Abelardo Diesel M.D.   On: 12/15/2018 09:05   Assessment and Plan:   Herron Fero. is a 78 y.o. male with a hx of CAD s/p CABG x 4 in 2013, HTN, DM, PAD, and HLD who is being seen today for exertional CP/SHOB in the setting of severe HTN and  NSVT  Exertional CP/SHOB Hypertension   - BP 217/112 on admission. Possible his severe HTN is causing his exertional symptoms. Would recommend getting better BP control then ambulating him. If he has recurrent symptoms then he may need repeat cath. - Continue metoprolol  - Could add amlodipine and diuretic therapy   CAD s/p CABG x 4 2013  Ischemic Cardiomyopathy  - Presented today with exertional chest pressure, SHOB and facial flushing  - ECG without ischemic changes. Repeat in AM  - Initial troponin negative. Would trend troponin  - Continue Metoprolol 50 mg QHS - Not on ACE due to CKD  - Unable to tolerate statin therapy  - Possible his symptoms are related to angina or arrhythmia but would recommend better BP control then re-assessing   PAF - Reviewed EMS runsheet and strips at bedside.  - EKG here stable compared to prior  - Known ischemic cardiomyopathy with last LVEF 45-50%  - Would recheck echo  - Hold eliquis and start IV heparin in case patient needs to go to cath lab  PAD - No options for surgical or endovascular repair per Dr. Scot Dock  - Is currently on Cilostazol. I would recommend discontinuing this given the patient's ischemic cardiomyopathy; however, he does not have any signs/symptoms of HF.  Will discuss the case further with Dr. Tamala Julian.   For questions or updates, please contact [facility] Please  consult [email or web address] for contact info under Cardiology/STEMI.   Signed, Ina Homes, MD 12/15/2018 11:05 AM

## 2018-12-15 NOTE — ED Notes (Signed)
Attempted report 

## 2018-12-15 NOTE — Progress Notes (Signed)
ANTICOAGULATION CONSULT NOTE - Follow Up Consult  Pharmacy Consult for heparin Indication: AFib + DVT   Patient Measurements: Heparin Dosing Weight: 86 kg  Vital Signs: Temp: 97.7 F (36.5 C) (02/21 2123) Temp Source: Oral (02/21 1730) BP: 158/75 (02/21 2123) Pulse Rate: 64 (02/21 2123)  Labs: Recent Labs    12/15/18 0826 12/15/18 1142 12/15/18 1307 12/15/18 1755 12/15/18 2014  HGB 13.4  --   --   --   --   HCT 39.7  --   --   --   --   PLT 120*  --   --   --   --   APTT  --   --  28  --  30  CREATININE 1.51*  --   --   --   --   TROPONINI <0.03 <0.03  --  <0.03  --     Assessment: 78 year old on apixaban PTA for AFib and remote history of a DVT. Last dose was yesterday evening. He is admitted now due to chest pressure and SOB. He will be transitioned to heparin. H/h wnl, plts 120 but appear to run low at baseline.  Initial aPTT is subtherapeutic at 30. No issues with infusion per RN and no s/s of bleeding noted.   Goal of Therapy:  Heparin level 0.3-0.7 units/ml aPTT 66-102 seconds Monitor platelets by anticoagulation protocol: Yes    Plan:  -Increase heparin infusion to 1450 units/hr -F/u 8 hr aPTT/HL -Daily HL, CBC, aPTT   Albertina Parr, PharmD., BCPS Clinical Pharmacist Clinical phone for 12/15/18 until 3:30pm: [phone] If after 3:30pm, please refer to [facility] for unit-specific pharmacist

## 2018-12-15 NOTE — Progress Notes (Signed)
Bilateral renal artery duplex exam completed. Please see preliminary notes on CV PROC under chart review. Berdene Askari H Starling Christofferson(RDMS RVT) 12/15/18 12:55 PM

## 2018-12-15 NOTE — ED Notes (Signed)
Echo in progress at bedside.

## 2018-12-15 NOTE — ED Notes (Signed)
Pt to vascular.

## 2018-12-15 NOTE — Progress Notes (Signed)
  Echocardiogram 2D Echocardiogram has been performed.  Steve [name] 12/15/2018, 2:49 PM

## 2018-12-15 NOTE — ED Provider Notes (Signed)
[facility] EMERGENCY DEPARTMENT Provider Note   CSN: [id number] Arrival date & time: 12/15/18  7371    History   Chief Complaint Chief Complaint  Patient presents with  . Irregular Heart Beat    HPI Steve [name]. is a 78 y.o. male with a PMH of CAD, s/p CABG in 2013, HTN, HLD, Type 2 DM, and PAF on Eliquis presenting with intermittent middle non radiating chest pain onset today at 7am while sitting on chair. Patient arrived via EMS from [employer]. Patient describes chest pain as pressure and states nothing made it better or worse. Patient states chest pain resolved on its own. Patient reports associated shortness of breath and sweats. Patient states symptoms resolved in the ambulance. EMS reports ventricular tachycardia and reports patient converted on his own. Patient denies nausea, vomiting, or abdominal pain. Patient reports cough, congestion, rhinorrhea, and diarrhea for 1 week. Patient reports chronic leg edema and states edema is at baseline. Patient denies fever or sick exposures. Patient denies recent surgeries or recent travel. Patient denies taking his medications today.     HPI  Past Medical History:  Diagnosis Date  . CAD (coronary artery disease)    a. s/p CABG x 29 Nov 2011 with LIMA to LAD, SVG to OM1 and distal LCX, SVG to ramus intermediate  . Essential hypertension   . Hyperlipidemia   . Intermittent claudication ([place])   . Ischemic cardiomyopathy    a. 03/2015 Echo: EF45-50%, Gr1 DD, mild MR.  Marland Kitchen PAF (paroxysmal atrial fibrillation) ([place])    a. post op atrial fib 11/2011; short course of amiodarone; stopped 01/25/12  . Peripheral arterial disease ([place])   . Right bundle branch block   . Type II diabetes mellitus [facility])     Patient Active Problem List   Diagnosis Date Noted  . Atrial fibrillation ([place]) 12/15/2018  . CKD (chronic kidney disease), symptom management only, stage 4 (severe) ([place]) 09/08/2018  . Sepsis  ([place]) 09/07/2018  . Diabetic ulcer of toe of left foot associated with type 2 diabetes mellitus ([place])   . Cellulitis of left anterior lower leg 07/25/2018  . Iron deficiency anemia 07/24/2018  . Acute kidney injury superimposed on chronic kidney disease ([place]) 07/24/2018  . Hyponatremia 07/24/2018  . Hypokalemia 07/24/2018  . Metabolic acidosis 04/19/9484  . Acute renal failure (ARF) ([place]) 07/24/2018  . Normocytic anemia 07/24/2018  . Thrombocytopenia ([place]) 07/24/2018  . Ischemic cardiomyopathy   . CAD (coronary artery disease)   . PAF (paroxysmal atrial fibrillation) ([place])   . Essential hypertension   . Type II diabetes mellitus ([place])   . Near syncope 04/01/2015  . Dizziness and giddiness 04/01/2015  . Fatigue 04/01/2015  . Chronic renal disease, stage II 04/01/2015  . RBBB 04/01/2015  . Chest pain with moderate risk of acute coronary syndrome 04/01/2015  . PAT (paroxysmal atrial tachycardia) ([place]) 12/16/2011  . 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  . Diabetes mellitus, type 2 ([place]) 05/31/2011  . Hypertension   . PVD (peripheral vascular disease) ([place])   . Hyperlipidemia     Past Surgical History:  Procedure Laterality Date  . CARDIAC CATHETERIZATION  01/16/1999   Est. EF of 65% -- Nonobstruction atherosclerotic coronary artery disease -- Normal left ventricular function      . CARDIAC ELECTROPHYSIOLOGY STUDY AND ABLATION  05/21/1999   Normal sinus funtion -- Mildly prolonged interatrial conduction times -- Normal A-V node funtion -- Normal His Purkinje system function -- fNo accessory  pathway -- No inducible ventricular tachycardia in the presence of the or in the absence of isoproterenol with programmed stimulation or with burst pacing -- Nikki Dom, M.D. FACC surgan          . CHOLECYSTECTOMY  1999  . CORONARY ARTERY BYPASS GRAFT  12/10/2011   Procedure: CORONARY ARTERY BYPASS GRAFTING (CABG);  Surgeon: Tharon Aquas Adelene Idler, MD;  Location: [facility];  Service: Open Heart Surgery;   Laterality: N/A;  Coronary Artery bypass graft on pump times four utilizing left internal mammary artery and left saphenous vein harvested endoscopically  . LEFT HEART CATHETERIZATION WITH CORONARY ANGIOGRAM N/A 12/09/2011   Procedure: LEFT HEART CATHETERIZATION WITH CORONARY ANGIOGRAM;  Surgeon: Peter M Martinique, MD;  Location: [facility] C[facility] LAB;  Service: Cardiovascular;  Laterality: N/A;  . LOWER EXTREMITY ANGIOGRAM Left 07/28/2018   Procedure: Lower Extremity Angiogram;  Surgeon: Angelia Mould, MD;  Location: [facility] CV LAB;  Service: Cardiovascular;  Laterality: Left;        Home Medications    Prior to Admission medications   Medication Sig Start Date End Date Taking? Authorizing Provider  allopurinol (ZYLOPRIM) 100 MG tablet Take 100 mg by mouth daily with breakfast. To prevent gout 07/18/18  Yes [provider]  apixaban (ELIQUIS) 5 MG TABS tablet Take 1 tablet (5 mg total) by mouth 2 (two) times daily. 07/29/18  Yes Helberg, Larkin Ina, MD  cilostazol (PLETAL) 100 MG tablet Take 1 tablet (100 mg total) by mouth 2 (two) times daily. 09/29/18  Yes Martinique, Peter M, MD  docusate sodium (COLACE) 100 MG capsule Take 100 mg by mouth daily.   Yes [provider]  ferrous sulfate 325 (65 FE) MG tablet Take 325 mg by mouth daily with breakfast.   Yes [provider]  metoprolol tartrate (LOPRESSOR) 100 MG tablet Take 50 mg by mouth every evening.    Yes [provider]  Probiotic Product (PROBIOTIC & ACIDOPHILUS EX ST) CAPS Take 1 capsule by mouth 2 (two) times daily. Switch to any p.o. twice daily 10-day supply probiotic Patient taking differently: Take 1 capsule by mouth 2 (two) times daily.  09/10/18  Yes Thurnell Lose, MD  vitamin B-12 (CYANOCOBALAMIN) 500 MCG tablet Take 500 mcg by mouth daily.   Yes [provider]  tamsulosin (FLOMAX) 0.4 MG CAPS capsule Take 0.4 mg by mouth at bedtime. 11/01/18   [provider]  furosemide (LASIX) 40  MG tablet Take 1 tablet (40 mg total) by mouth daily. For 4 days then stop. 12/15/11 01/05/12  Nani Skillern, PA-C    Family History Family History  Problem Relation Age of Onset  . Stroke Mother 70       Cerebellar hemorrhage    Social History Social History   Tobacco Use  . Smoking status: Former Smoker    Packs/day: 1.50    Years: 20.00    Pack years: 30.00    Types: Cigarettes    Last attempt to quit: 05/27/1979    Years since quitting: 39.5  . Smokeless tobacco: Former Network engineer Use Topics  . Alcohol use: Yes    Alcohol/week: 4.0 standard drinks    Types: 4 Shots of liquor per week    Comment: 4 "canadian clubs each night"  . Drug use: No     Allergies   Statins   Review of Systems Review of Systems  Constitutional: Positive for diaphoresis. Negative for activity change, appetite change, chills, fatigue, fever and unexpected  weight change.  HENT: Positive for congestion and rhinorrhea. Negative for sore throat.   Eyes: Negative for visual disturbance.  Respiratory: Positive for cough, chest tightness and shortness of breath. Negative for wheezing.   Cardiovascular: Positive for chest pain. Negative for palpitations and leg swelling.  Gastrointestinal: Positive for diarrhea. Negative for abdominal pain, nausea and vomiting.  Endocrine: Negative for cold intolerance and heat intolerance.  Genitourinary: Negative for dysuria.  Musculoskeletal: Negative for back pain and myalgias.  Skin: Negative for rash.  Allergic/Immunologic: Negative for immunocompromised state.  Neurological: Negative for dizziness, syncope, weakness and light-headedness.  Psychiatric/Behavioral: Negative for agitation and behavioral problems. The patient is not nervous/anxious.     Physical Exam Updated Vital Signs BP (!) 185/94   Pulse 79   Temp 98.4 F (36.9 C) (Oral)   Resp (!) 21   SpO2 97%   Physical Exam Vitals signs and nursing note reviewed.  Constitutional:       General: He is not in acute distress.    Appearance: He is well-developed. He is not diaphoretic.  HENT:     Head: Normocephalic and atraumatic.     Right Ear: Tympanic membrane, ear canal and external ear normal.     Left Ear: Tympanic membrane, ear canal and external ear normal.     Nose: Congestion and rhinorrhea present.     Mouth/Throat:     Mouth: Mucous membranes are moist.     Pharynx: No oropharyngeal exudate or posterior oropharyngeal erythema.  Eyes:     Extraocular Movements: Extraocular movements intact.     Conjunctiva/sclera: Conjunctivae normal.     Pupils: Pupils are equal, round, and reactive to light.  Neck:     Musculoskeletal: Normal range of motion and neck supple.     Vascular: No JVD.  Cardiovascular:     Rate and Rhythm: Normal rate and regular rhythm.     Pulses: Normal pulses.          Radial pulses are 2+ on the right side and 2+ on the left side.       Dorsalis pedis pulses are 2+ on the right side and 2+ on the left side.     Heart sounds: Normal heart sounds. No murmur. No friction rub. No gallop.   Pulmonary:     Effort: Pulmonary effort is normal. No respiratory distress.     Breath sounds: Normal breath sounds. No wheezing or rales.  Chest:     Chest wall: No tenderness.  Abdominal:     Palpations: Abdomen is soft.     Tenderness: There is no abdominal tenderness.  Musculoskeletal: Normal range of motion.     Right lower leg: Edema (1+ pitting edema noted on ankles bilaterally.) present.     Left lower leg: Edema present.  Skin:    General: Skin is warm.     Capillary Refill: Capillary refill takes less than 2 seconds.     Coloration: Skin is not pale.     Findings: No rash.  Neurological:     Mental Status: He is alert and oriented to person, place, and time.    ED Treatments / Results  Labs (all labs ordered are listed, but only abnormal results are displayed) Labs Reviewed  CBC - Abnormal; Notable for the following components:       Result Value   RBC 4.03 (*)    Platelets 120 (*)    All other components within normal limits  COMPREHENSIVE METABOLIC PANEL - Abnormal; Notable  for the following components:   Glucose, Bld 164 (*)    Creatinine, Ser 1.51 (*)    Total Bilirubin 1.4 (*)    GFR calc non Af Amer 43 (*)    GFR calc Af Amer 50 (*)    All other components within normal limits  URINALYSIS, ROUTINE W REFLEX MICROSCOPIC - Abnormal; Notable for the following components:   Protein, ur 30 (*)    Nitrite POSITIVE (*)    All other components within normal limits  MAGNESIUM  PHOSPHORUS  TROPONIN I  BRAIN NATRIURETIC PEPTIDE  TROPONIN I  TROPONIN I  TROPONIN I  TSH  I-STAT TROPONIN, ED    EKG EKG Interpretation  Date/Time:  Friday December 15 2018 08:33:37 EST Ventricular Rate:  94 PR Interval:    QRS Duration: 143 QT Interval:  424 QTC Calculation: 531 R Axis:   -77 Text Interpretation:  Sinus rhythm Atrial premature complex Probable left atrial enlargement RBBB and LAFB Left ventricular hypertrophy No significant change was found Confirmed by Jola Schmidt [phone] on 12/15/2018 8:37:08 AM   Radiology Dg Chest 2 View  Result Date: 12/15/2018 CLINICAL DATA:  Hot flash and chest pain EXAM: CHEST - 2 VIEW COMPARISON:  September 07, 2018 FINDINGS: The heart size and mediastinal contours are stable. There is no focal infiltrate, pulmonary edema, or pleural effusion. Mild atelectasis is identified in the right lung base. The visualized skeletal structures are unremarkable. IMPRESSION: No focal pneumonia or pulmonary edema. Mild atelectasis of right lung base. Electronically Signed   By: Abelardo Diesel M.D.   On: 12/15/2018 09:05    Procedures Procedures (including critical care time)  Medications Ordered in ED Medications  metoprolol tartrate (LOPRESSOR) tablet 50 mg (has no administration in time range)  docusate sodium (COLACE) capsule 100 mg (has no administration in time range)  tamsulosin (FLOMAX)  capsule 0.4 mg (has no administration in time range)  apixaban (ELIQUIS) tablet 5 mg (has no administration in time range)  cilostazol (PLETAL) tablet 100 mg (has no administration in time range)  ferrous sulfate tablet 325 mg (has no administration in time range)  vitamin B-12 (CYANOCOBALAMIN) tablet 500 mcg (has no administration in time range)  acetaminophen (TYLENOL) tablet 650 mg (has no administration in time range)    Or  acetaminophen (TYLENOL) suppository 650 mg (has no administration in time range)  ondansetron (ZOFRAN) tablet 4 mg (has no administration in time range)    Or  ondansetron (ZOFRAN) injection 4 mg (has no administration in time range)  isosorbide mononitrate (IMDUR) 24 hr tablet 30 mg (has no administration in time range)  amLODipine (NORVASC) tablet 10 mg (has no administration in time range)     Initial Impression / Assessment and Plan / ED Course  I have reviewed the triage vital signs and the nursing notes.  Pertinent labs & imaging results that were available during my care of the patient were reviewed by me and considered in my medical decision making (see chart for details).  Clinical Course as of Dec 15 1154  Fri Dec 15, 2018  0908 No focal pneumonia or pulmonary edema noted on CXR. Mild atelectasis of right lung base present.    DG Chest 2 View [AH]  0915 Elevated creatinine and low GFR, similar to previous values.   Creatinine(!): 1.51 [AH]  0923 Chloride: 103 [AH]    Clinical Course User Index [AH] Arville Lime, PA-C      Concern for cardiac etiology of Chest Pain. Cardiology has  been consulted and state they will evaluate patient. Pt has been re-evaluated prior to consult and VSS, NAD, heart RRR, pain 0/10, lungs CTAB. EKG reveals sinus rhythm with left ventricular hypertrophy. EMS strip reveals possible ST elevation in inferior leads. First round of cardiac enzymes negative. Cardiology suspect symptoms are likely due to paroxsymal rapid  atrial fibrillation. Cardiology has agreed to admit and monitor patient. This case was discussed with Dr. Venora Maples who has seen the patient and agrees with plan to admit.   Final Clinical Impressions(s) / ED Diagnoses   Final diagnoses:  Hypertensive urgency  Atypical chest pain    ED Discharge Orders    None       Julienne Kass 12/15/18 1157    Jola Schmidt, MD 12/15/18 1335

## 2018-12-15 NOTE — Progress Notes (Signed)
ANTICOAGULATION CONSULT NOTE - Initial Consult  Pharmacy Consult for heparin Indication: AFib + DVT   Patient Measurements: Heparin Dosing Weight: 86 kg  Vital Signs: Temp: 98.4 F (36.9 C) (02/21 0815) Temp Source: Oral (02/21 0815) BP: 183/96 (02/21 1215) Pulse Rate: 69 (02/21 1215)  Labs: Recent Labs    12/15/18 0826  HGB 13.4  HCT 39.7  PLT 120*  CREATININE 1.51*  TROPONINI <0.03    Assessment: 78 year old on apixaban PTA for AFib and remote history of a DVT. Last dose was yesterday evening. He is admitted now due to chest pressure and SOB. He will be transitioned to heparin. H/h wnl, plts 120 but appear to run low at baseline.   Goal of Therapy:  Heparin level 0.3-0.7 units/ml Monitor platelets by anticoagulation protocol: Yes    Plan:  -Heparin infusion at 1200 units/hr -Daily HL, CBC, aPTT -Check aPTT 8 hours after infusion begins   Steve [name] 12/15/2018,12:40 PM

## 2018-12-15 NOTE — ED Notes (Signed)
Pharm called to verify meds

## 2018-12-15 NOTE — ED Notes (Signed)
KEEP PT NPO

## 2018-12-15 NOTE — H&P (Signed)
  The patient has been seen in conjunction with Ina Homes, MD. All aspects of care have been considered and discussed. The patient has been personally interviewed, examined, and all clinical data has been reviewed.   Sudden onset of recurring chest tightness, dyspnea, and palpitations occurring earlier this morning before breakfast or any medications.  Episodes will be sudden onset and then dissipate.  After multiple recurrences of discomfort he came to the emergency room for evaluation.  EKG rhythm strip by EMS demonstrate self terminating episodes of atrial fibrillation with rapid ventricular response.  At the time of exam he is asymptomatic.  Blood pressures been extremely elevated, likely related to not having had medications yet this morning.  He will have a 2D Doppler echocardiogram to assess LV function, serial markers to rule out myocardial infarction, be placed on telemetry to determine if he is having recurrent A. fib as the source of his discomfort.  Will likely need to have an ischemic evaluation to rule out bypass graft failure.  Will hold apixaban and start IV heparin in case the patient needs coronary angiography.    Please see the official note listed under consult but intended as H and P.

## 2018-12-15 NOTE — ED Notes (Signed)
Report given to [facility], [employer]or, [employer]

## 2018-12-15 NOTE — ED Notes (Signed)
Pt stood to use urinal and bedside commode. This NT assisted pt to stand, but pt denied further assistance. Pt standing at side of bed with urinal

## 2018-12-15 NOTE — ED Triage Notes (Addendum)
Pt was in lobby of [facility]- said he felt hot and had chest pain. EMS on scene and Vtach then converted on his own. Was hypertensive. Has not had breakfast or taken any meds.

## 2018-12-16 ENCOUNTER — Other Ambulatory Visit: Payer: Self-pay

## 2018-12-16 DIAGNOSIS — N183 Chronic kidney disease, stage 3 (moderate): Secondary | ICD-10-CM

## 2018-12-16 DIAGNOSIS — I255 Ischemic cardiomyopathy: Secondary | ICD-10-CM

## 2018-12-16 DIAGNOSIS — I25110 Atherosclerotic heart disease of native coronary artery with unstable angina pectoris: Secondary | ICD-10-CM

## 2018-12-16 LAB — CBC
HCT: 34.1 % — ABNORMAL LOW (ref 39.0–52.0)
Hemoglobin: 12.1 g/dL — ABNORMAL LOW (ref 13.0–17.0)
MCH: 34.4 pg — ABNORMAL HIGH (ref 26.0–34.0)
MCHC: 35.5 g/dL (ref 30.0–36.0)
MCV: 96.9 fL (ref 80.0–100.0)
NRBC: 0 % (ref 0.0–0.2)
Platelets: 111 10*3/uL — ABNORMAL LOW (ref 150–400)
RBC: 3.52 MIL/uL — AB (ref 4.22–5.81)
RDW: 13.7 % (ref 11.5–15.5)
WBC: 6.4 10*3/uL (ref 4.0–10.5)

## 2018-12-16 LAB — TROPONIN I: Troponin I: <0.03 ng/mL (ref <0.03)

## 2018-12-16 LAB — HEPARIN LEVEL (UNFRACTIONATED): Heparin Unfractionated: 0.76 IU/mL — ABNORMAL HIGH (ref 0.30–0.70)

## 2018-12-16 LAB — APTT
APTT: 35 s (ref 24–36)
APTT: 64 s — AB (ref 24–36)

## 2018-12-16 LAB — BASIC METABOLIC PANEL
ANION GAP: 8 (ref 5–15)
BUN: 15 mg/dL (ref 8–23)
CO2: 23 mmol/L (ref 22–32)
Calcium: 8.6 mg/dL — ABNORMAL LOW (ref 8.9–10.3)
Chloride: 105 mmol/L (ref 98–111)
Creatinine, Ser: 1.4 mg/dL — ABNORMAL HIGH (ref 0.61–1.24)
GFR calc non Af Amer: 47 mL/min — ABNORMAL LOW (ref >60)
GFR, EST AFRICAN AMERICAN: 55 mL/min — AB (ref >60)
Glucose, Bld: 119 mg/dL — ABNORMAL HIGH (ref 70–99)
Potassium: 3.4 mmol/L — ABNORMAL LOW (ref 3.5–5.1)
SODIUM: 136 mmol/L (ref 135–145)

## 2018-12-16 NOTE — Progress Notes (Signed)
ANTICOAGULATION CONSULT NOTE - Follow Up Consult  Pharmacy Consult for heparin Indication: AFib + DVT  Patient Measurements: Heparin Dosing Weight: 86 kg  Vital Signs: Temp: 97.2 F (36.2 C) (02/22 1329) Temp Source: Oral (02/22 1329) BP: 149/69 (02/22 1329) Pulse Rate: 65 (02/22 1329)  Labs: Recent Labs    12/15/18 0826 12/15/18 1142  12/15/18 1755 12/15/18 2014 12/16/18 0016 12/16/18 0625 12/16/18 1832  HGB 13.4  --   --   --   --  12.1*  --   --   HCT 39.7  --   --   --   --  34.1*  --   --   PLT 120*  --   --   --   --  111*  --   --   APTT  --   --    < >  --  30  --  35 64*  HEPARINUNFRC  --   --   --   --   --   --  0.76*  --   CREATININE 1.51*  --   --   --   --  1.40*  --   --   TROPONINI <0.03 <0.03  --  <0.03  --  <0.03  --   --    < > = values in this interval not displayed.    Assessment: 53 yoM admitted for chest pressure and SOB. PMH s/f afib and remote DVT on apixaban PTA (last dose 2/21 PM). Apixaban held for possible ischemic evaluation and pharmacy consulted to dose IV heparin. Will dose via aPTT given DOAC influence on heparin levels. aPTT still remains slightly below goal range at 64 following rate increase this AM. Hgb 12.1 stable, pltc 111 (appears low at baseline). No issues with infusion or interruptions confirmed with RN. No bleeding noted.  Goal of Therapy:  Heparin level 0.3-0.7 units/ml aPTT 66-102 seconds Monitor platelets by anticoagulation protocol: Yes   Plan:  Increase heparin gtt to 1750 units/hr Recheck aPTT in 8 hours Daily heparin level/aPTT and CBC Monitor s/sx of bleeding  Vaughn Frieze N. Gerarda Fraction, PharmD, [place] PGY2 Infectious Diseases Pharmacy Resident Phone: [phone] 12/16/2018

## 2018-12-16 NOTE — Plan of Care (Signed)
  Problem: Clinical Measurements: Goal: Respiratory complications will improve Outcome: Progressing Note:  No s/s of respiratory complications noted.  Stable on room air.

## 2018-12-16 NOTE — Progress Notes (Signed)
Progress Note  Patient Name: Steve [name]. Date of Encounter: 12/16/2018  Primary Cardiologist: Dr. Peter Martinique  Subjective   No chest pain or shortness of breath at rest.  No palpitations.  Slept well.  Inpatient Medications    Scheduled Meds: . amLODipine  10 mg Oral Daily  . cilostazol  100 mg Oral BID  . docusate sodium  100 mg Oral Daily  . ferrous sulfate  325 mg Oral Q breakfast  . isosorbide mononitrate  30 mg Oral Daily  . metoprolol tartrate  50 mg Oral BID  . tamsulosin  0.4 mg Oral QHS  . vitamin B-12  500 mcg Oral Daily   Continuous Infusions: . heparin 1,450 Units/hr (12/16/18 0654)   PRN Meds: acetaminophen **OR** acetaminophen, ondansetron **OR** ondansetron (ZOFRAN) IV   Vital Signs    Vitals:   12/15/18 2123 12/16/18 0622 12/16/18 0631 12/16/18 0902  BP: (!) 158/75  (!) 188/111 (!) 168/70  Pulse: 64  89 75  Resp: 17  20   Temp: 97.7 F (36.5 C)  97.7 F (36.5 C)   TempSrc:      SpO2: 98%  100%   Weight:  82.5 kg    Height:        Intake/Output Summary (Last 24 hours) at 12/16/2018 1106 Last data filed at 12/16/2018 0900 Gross per 24 hour  Intake 652.26 ml  Output 700 ml  Net -47.74 ml   Filed Weights   12/15/18 1506 12/16/18 0622  Weight: 85.4 kg 82.5 kg    Telemetry    Sinus rhythm.  Occasional PVCs.  Personally reviewed.  ECG    Tracing from 12/16/2018 shows sinus rhythm with LVH, right bundle branch block, left anterior fascicular block, left atrial enlargement.  Personally reviewed.  Physical Exam   GEN:  Elderly male.  No acute distress.   Neck: No JVD. Cardiac: RRR, soft systolic murmur, no gallop.  Respiratory: Nonlabored. Clear to auscultation bilaterally. GI: Soft, nontender, bowel sounds present. MS: No edema; No deformity. Neuro:  Nonfocal. Psych: Alert and oriented x 3. Normal affect.  Labs    Chemistry Recent Labs  Lab 12/15/18 0826 12/16/18 0016  NA 138 136  K 4.0 3.4*  CL 103 105  CO2 26 23    GLUCOSE 164* 119*  BUN 13 15  CREATININE 1.51* 1.40*  CALCIUM 9.0 8.6*  PROT 7.2  --   ALBUMIN 3.9  --   AST 20  --   ALT 14  --   ALKPHOS 40  --   BILITOT 1.4*  --   GFRNONAA 43* 47*  GFRAA 50* 55*  ANIONGAP 9 8     Hematology Recent Labs  Lab 12/15/18 0826 12/16/18 0016  WBC 5.7 6.4  RBC 4.03* 3.52*  HGB 13.4 12.1*  HCT 39.7 34.1*  MCV 98.5 96.9  MCH 33.3 34.4*  MCHC 33.8 35.5  RDW 13.7 13.7  PLT 120* 111*    Cardiac Enzymes Recent Labs  Lab 12/15/18 0826 12/15/18 1142 12/15/18 1755 12/16/18 0016  TROPONINI <0.03 <0.03 <0.03 <0.03    Recent Labs  Lab 12/15/18 1144  TROPIPOC 0.02     BNP Recent Labs  Lab 12/15/18 0826  BNP 289.5*     Radiology    Dg Chest 2 View  Result Date: 12/15/2018 CLINICAL DATA:  Hot flash and chest pain EXAM: CHEST - 2 VIEW COMPARISON:  September 07, 2018 FINDINGS: The heart size and mediastinal contours are stable. There is no focal  infiltrate, pulmonary edema, or pleural effusion. Mild atelectasis is identified in the right lung base. The visualized skeletal structures are unremarkable. IMPRESSION: No focal pneumonia or pulmonary edema. Mild atelectasis of right lung base. Electronically Signed   By: Abelardo Diesel M.D.   On: 12/15/2018 09:05   Vas US Renal Artery Duplex  Result Date: 12/15/2018 ABDOMINAL VISCERAL Indications: HTN High Risk Factors: Hypertension. Limitations: Air/bowel gas, patient discomfort and Abdominal pain. Comparison Study: No comparison study available Performing Technologist: Rudell Cobb  Examination Guidelines: A complete evaluation includes B-mode imaging, spectral Doppler, color Doppler, and power Doppler as needed of all accessible portions of each vessel. Bilateral testing is considered an integral part of a complete examination. Limited examinations for reoccurring indications may be performed as noted.  Duplex Findings: +----------------------+--------+--------+------+--------------+ Mesenteric             PSV cm/sEDV cm/sPlaque   Comments    +----------------------+--------+--------+------+--------------+ Aorta Prox              152      13                        +----------------------+--------+--------+------+--------------+ Celiac Artery Proximal  140                                +----------------------+--------+--------+------+--------------+ SMA Origin              205                 high resistant +----------------------+--------+--------+------+--------------+  +------------------+--------+--------+-------+ Right Renal ArteryPSV cm/sEDV cm/sComment +------------------+--------+--------+-------+ Origin              176      68           +------------------+--------+--------+-------+ Proximal             52      12           +------------------+--------+--------+-------+ Mid                  41      7            +------------------+--------+--------+-------+ Distal               40      10           +------------------+--------+--------+-------+ +-----------------+--------+--------+-------+ Left Renal ArteryPSV cm/sEDV cm/sComment +-----------------+--------+--------+-------+ Origin             142      40           +-----------------+--------+--------+-------+ Proximal            77      14           +-----------------+--------+--------+-------+ Mid                 48      9            +-----------------+--------+--------+-------+ Distal              49      10           +-----------------+--------+--------+-------+ +------------+--------+--------+----+-----------+--------+--------+----+ Right KidneyPSV cm/sEDV cm/sRI  Left KidneyPSV cm/sEDV cm/sRI   +------------+--------+--------+----+-----------+--------+--------+----+ Upper Pole  17      5       0.73Upper Pole 21      6       0.71 +------------+--------+--------+----+-----------+--------+--------+----+ Mid  16      4       0.74Mid        18       5       0.72 +------------+--------+--------+----+-----------+--------+--------+----+ Lower Pole  18      4       0.79Lower Pole 16      4       0.74 +------------+--------+--------+----+-----------+--------+--------+----+ Hilar       24      5       0.79Hilar      23      4       0.83 +------------+--------+--------+----+-----------+--------+--------+----+ +------------------+-----+------------------+-----+ Right Kidney           Left Kidney             +------------------+-----+------------------+-----+ RAR                    RAR                     +------------------+-----+------------------+-----+ RAR (manual)      1.15 RAR (manual)      0.93  +------------------+-----+------------------+-----+ Cortex                 Cortex                  +------------------+-----+------------------+-----+ Cortex thickness       Corex thickness         +------------------+-----+------------------+-----+ Kidney length (cm)10.10Kidney length (cm)11.60 +------------------+-----+------------------+-----+  Summary: Renal:  Right: No evidence of right renal artery stenosis. Abnormal right        Resistive Index. RRV flow present. Normal size right kidney. Left:  No evidence of left renal artery stenosis. Abnormal left        Resisitve Index. LRV flow present. Normal size of left        kidney.  *See table(s) above for measurements and observations.  Diagnosing physician: [name] MD  Electronically signed by [name] MD on 12/15/2018 at 2:35:16 PM.    Final     Cardiac Studies   Echocardiogram 12/15/2018:  1. The left ventricle has moderately reduced systolic function, with an ejection fraction of 35-40%. The cavity size was mildly dilated. Left ventricular diastolic Doppler parameters are consistent with impaired relaxation.  2. Moderate hypokinesis of the basal left ventricular inferoseptal wall.  3. The right ventricle has normal systolic function.  The cavity was normal. There is no increase in right ventricular wall thickness. Right ventricular systolic pressure could not be assessed.  4. The mitral valve is normal in structure. Mild calcification of the mitral valve leaflet. There is mild mitral annular calcification present. Mitral valve regurgitation is mild to moderate by color flow Doppler.  5. The tricuspid valve is normal in structure.  6. The aortic valve is tricuspid.  7. The aortic root is normal in size and structure.  8. There is mild dilatation of the ascending aorta.  Patient Profile     78 y.o. male with a history of multivessel CAD status post CABG in 2013, paroxysmal atrial fibrillation on Eliquis, hypertension, type 2 diabetes mellitus, hyperlipidemia, and PAD.  He presents with recent onset chest pain, brief episodes of PAF, possibly NSVT.  Follow-up echocardiogram shows reduction in LVEF to the range of 35 to 40%.  Assessment & Plan    1.  Unstable angina.  Cardiac enzymes negative.  ECG reviewed and nonspecific.  2.  Multivessel CAD status post CABG  in 2013.  3.  Cardiomyopathy, LVEF 35 to 40% by recent follow-up echocardiogram with moderate hypokinesis of the basal inferoseptal wall.  This represents a reduction in function compared to prior testing.  4.  CKD stage III, creatinine 1.4.  5.  Paroxysmal atrial fibrillation, currently in sinus rhythm.  Eliquis held with transition to heparin in anticipation of possible invasive testing.  Reviewed presentation and follow-up testing with patient.  We discussed arranging a diagnostic cardiac catheterization on Monday for evaluation of native coronary and bypass graft anatomy in light of recent angina symptoms and also reduction in LVEF.  Risks and benefits discussed, he is in agreement to proceed.  Continue heparin for now.  Signed, Rozann Lesches, MD  12/16/2018, 11:06 AM

## 2018-12-16 NOTE — Plan of Care (Signed)
  Problem: Clinical Measurements: Goal: Cardiovascular complication will be avoided Outcome: Progressing Note:  No s/s of cardiovascular complication noted.  NSR on telemetry.  VSS.

## 2018-12-16 NOTE — Progress Notes (Signed)
ANTICOAGULATION CONSULT NOTE - Follow Up Consult  Pharmacy Consult for heparin Indication: AFib + DVT   Patient Measurements: Heparin Dosing Weight: 86 kg  Vital Signs: Temp: 97.7 F (36.5 C) (02/22 0631) BP: 168/70 (02/22 0902) Pulse Rate: 75 (02/22 0902)  Labs: Recent Labs    12/15/18 0826 12/15/18 1142 12/15/18 1307 12/15/18 1755 12/15/18 2014 12/16/18 0016 12/16/18 0625  HGB 13.4  --   --   --   --  12.1*  --   HCT 39.7  --   --   --   --  34.1*  --   PLT 120*  --   --   --   --  111*  --   APTT  --   --  28  --  30  --  35  HEPARINUNFRC  --   --   --   --   --   --  0.76*  CREATININE 1.51*  --   --   --   --  1.40*  --   TROPONINI <0.03 <0.03  --  <0.03  --  <0.03  --     Assessment: 78 year old on apixaban PTA for AFib and remote history of a DVT. Last dose was yesterday evening. He is admitted now due to chest pressure and SOB. He will be transitioned to heparin. H/h wnl, plts 120 but appear to run low at baseline.  Repeat aPTT remains subtherapeutic, heparin level affected by DOAC. Continuing to hold apixaban for possible ischemic evaluation. CBC stable.   Goal of Therapy:  Heparin level 0.3-0.7 units/ml aPTT 66-102 seconds Monitor platelets by anticoagulation protocol: Yes    Plan:  -Increase heparin infusion to 1650 units/hr -Recheck aPTT in [place], PharmD, BCPS Clinical Pharmacist [phone] Please check AMION for all [place] numbers 12/16/2018

## 2018-12-17 LAB — BASIC METABOLIC PANEL
Anion gap: 12 (ref 5–15)
BUN: 15 mg/dL (ref 8–23)
CALCIUM: 8.7 mg/dL — AB (ref 8.9–10.3)
CO2: 20 mmol/L — ABNORMAL LOW (ref 22–32)
Chloride: 104 mmol/L (ref 98–111)
Creatinine, Ser: 1.55 mg/dL — ABNORMAL HIGH (ref 0.61–1.24)
GFR calc Af Amer: 49 mL/min — ABNORMAL LOW (ref >60)
GFR calc non Af Amer: 42 mL/min — ABNORMAL LOW (ref >60)
Glucose, Bld: 142 mg/dL — ABNORMAL HIGH (ref 70–99)
Potassium: 3.4 mmol/L — ABNORMAL LOW (ref 3.5–5.1)
Sodium: 136 mmol/L (ref 135–145)

## 2018-12-17 LAB — CBC
HCT: 34.5 % — ABNORMAL LOW (ref 39.0–52.0)
Hemoglobin: 11.9 g/dL — ABNORMAL LOW (ref 13.0–17.0)
MCH: 33 pg (ref 26.0–34.0)
MCHC: 34.5 g/dL (ref 30.0–36.0)
MCV: 95.6 fL (ref 80.0–100.0)
Platelets: 109 10*3/uL — ABNORMAL LOW (ref 150–400)
RBC: 3.61 MIL/uL — ABNORMAL LOW (ref 4.22–5.81)
RDW: 13.3 % (ref 11.5–15.5)
WBC: 5.7 10*3/uL (ref 4.0–10.5)
nRBC: 0 % (ref 0.0–0.2)

## 2018-12-17 LAB — HEPARIN LEVEL (UNFRACTIONATED): Heparin Unfractionated: 0.86 IU/mL — ABNORMAL HIGH (ref 0.30–0.70)

## 2018-12-17 LAB — PROTIME-INR
INR: 1.06
Prothrombin Time: 13.7 seconds (ref 11.4–15.2)

## 2018-12-17 LAB — APTT: aPTT: 100 seconds — ABNORMAL HIGH (ref 24–36)

## 2018-12-17 MED ORDER — ASPIRIN 81 MG PO CHEW
81.0000 mg | CHEWABLE_TABLET | ORAL | Status: AC
  Administered 2018-12-18: 81 mg via ORAL
  Filled 2018-12-17: qty 1

## 2018-12-17 MED ORDER — SODIUM CHLORIDE 0.9 % IV SOLN: 250.0000 mL | INTRAVENOUS | Status: DC | PRN

## 2018-12-17 MED ORDER — SODIUM CHLORIDE 0.9 % WEIGHT BASED INFUSION: 1.0000 mL/kg/h | INTRAVENOUS | Status: DC

## 2018-12-17 MED ORDER — ASPIRIN EC 81 MG PO TBEC
81.0000 mg | DELAYED_RELEASE_TABLET | Freq: Every day | ORAL | Status: DC
  Administered 2018-12-17 – 2018-12-18 (×2): 81 mg via ORAL
  Filled 2018-12-17 (×2): qty 1

## 2018-12-17 MED ORDER — SODIUM CHLORIDE 0.9 % WEIGHT BASED INFUSION
3.0000 mL/kg/h | INTRAVENOUS | Status: DC
  Administered 2018-12-17: 3 mL/kg/h via INTRAVENOUS

## 2018-12-17 MED ORDER — SODIUM CHLORIDE 0.9% FLUSH: 3.0000 mL | INTRAVENOUS | Status: DC | PRN

## 2018-12-17 MED ORDER — SODIUM CHLORIDE 0.9% FLUSH: 3.0000 mL | Freq: Two times a day (BID) | INTRAVENOUS | Status: DC

## 2018-12-17 MED ORDER — POTASSIUM CHLORIDE ER 10 MEQ PO TBCR
40.0000 meq | EXTENDED_RELEASE_TABLET | Freq: Once | ORAL | Status: AC
  Administered 2018-12-17: 40 meq via ORAL
  Filled 2018-12-17 (×2): qty 4

## 2018-12-17 NOTE — Progress Notes (Signed)
ANTICOAGULATION CONSULT NOTE - Follow Up Consult  Pharmacy Consult for heparin Indication: AFib + DVT  Patient Measurements: Heparin Dosing Weight: 86 kg  Vital Signs: Temp: 97.9 F (36.6 C) (02/23 0534) Temp Source: Oral (02/23 0534) BP: 147/72 (02/23 0534) Pulse Rate: 61 (02/23 0534)  Labs: Recent Labs    12/15/18 0826 12/15/18 1142  12/15/18 1755  12/16/18 0016 12/16/18 0625 12/16/18 1832 12/17/18 0438  HGB 13.4  --   --   --   --  12.1*  --   --  11.9*  HCT 39.7  --   --   --   --  34.1*  --   --  34.5*  PLT 120*  --   --   --   --  111*  --   --  109*  APTT  --   --    < >  --    < >  --  35 64* 100*  HEPARINUNFRC  --   --   --   --   --   --  0.76*  --  0.86*  CREATININE 1.51*  --   --   --   --  1.40*  --   --  1.55*  TROPONINI <0.03 <0.03  --  <0.03  --  <0.03  --   --   --    < > = values in this interval not displayed.    Assessment: 45 yoM admitted for chest pressure and SOB. PMH s/f afib and remote DVT on apixaban PTA (last dose 2/21 PM). Apixaban held for possible ischemic evaluation and pharmacy consulted to dose IV heparin. Will dose via aPTT given DOAC influence on heparin levels.  APTT this am therapeutic  Goal of Therapy:  Heparin level 0.3-0.7 units/ml aPTT 66-102 seconds Monitor platelets by anticoagulation protocol: Yes   Plan:  Continue heparin gtt at 1750 units/hr Daily heparin level/aPTT and CBC Monitor s/sx of bleeding  Thanks for allowing pharmacy to be a part of this patient's care.  Excell Seltzer, PharmD Clinical Pharmacist

## 2018-12-17 NOTE — Progress Notes (Signed)
Progress Note  Patient Name: Steve [name]. Date of Encounter: 12/17/2018  Primary Cardiologist: Dr. Peter Martinique  Subjective   No recurrent chest pain or shortness of breath.  No palpitations.  No abdominal pain.  Had some transient leg discomfort this morning.  Inpatient Medications    Scheduled Meds: . amLODipine  10 mg Oral Daily  . cilostazol  100 mg Oral BID  . docusate sodium  100 mg Oral Daily  . ferrous sulfate  325 mg Oral Q breakfast  . isosorbide mononitrate  30 mg Oral Daily  . metoprolol tartrate  50 mg Oral BID  . tamsulosin  0.4 mg Oral QHS  . vitamin B-12  500 mcg Oral Daily   Continuous Infusions: . heparin 1,750 Units/hr (12/16/18 2208)   PRN Meds: acetaminophen **OR** acetaminophen, ondansetron **OR** ondansetron (ZOFRAN) IV   Vital Signs    Vitals:   12/16/18 1329 12/16/18 2034 12/16/18 2206 12/17/18 0534  BP: (!) 149/69 (!) 153/73  (!) 147/72  Pulse: 65 74 83 61  Resp:      Temp: (!) 97.2 F (36.2 C) 98 F (36.7 C)  97.9 F (36.6 C)  TempSrc: Oral Oral  Oral  SpO2: 100% 96%  96%  Weight:    81.5 kg  Height:        Intake/Output Summary (Last 24 hours) at 12/17/2018 0919 Last data filed at 12/17/2018 0538 Gross per 24 hour  Intake 591.93 ml  Output 375 ml  Net 216.93 ml   Filed Weights   12/15/18 1506 12/16/18 0622 12/17/18 0534  Weight: 85.4 kg 82.5 kg 81.5 kg    Telemetry    Sinus rhythm.  Personally reviewed.  ECG    Tracing from 12/16/2018 shows a sinus rhythm with LVH, right bundle branch block, left anterior fascicular block.  Personally reviewed.  Physical Exam   GEN:  Elderly male.  No acute distress.   Neck: No JVD. Cardiac: RRR, soft systolic murmur, no gallop.  Respiratory: Nonlabored. Clear to auscultation bilaterally. GI: Soft, nontender, bowel sounds present. MS: No edema; No deformity. Neuro:  Nonfocal. Psych: Alert and oriented x 3. Normal affect.  Labs    Chemistry Recent Labs  Lab 12/15/18 0826  12/16/18 0016 12/17/18 0438  NA 138 136 136  K 4.0 3.4* 3.4*  CL 103 105 104  CO2 26 23 20*  GLUCOSE 164* 119* 142*  BUN 13 15 15   CREATININE 1.51* 1.40* 1.55*  CALCIUM 9.0 8.6* 8.7*  PROT 7.2  --   --   ALBUMIN 3.9  --   --   AST 20  --   --   ALT 14  --   --   ALKPHOS 40  --   --   BILITOT 1.4*  --   --   GFRNONAA 43* 47* 42*  GFRAA 50* 55* 49*  ANIONGAP 9 8 12      Hematology Recent Labs  Lab 12/15/18 0826 12/16/18 0016 12/17/18 0438  WBC 5.7 6.4 5.7  RBC 4.03* 3.52* 3.61*  HGB 13.4 12.1* 11.9*  HCT 39.7 34.1* 34.5*  MCV 98.5 96.9 95.6  MCH 33.3 34.4* 33.0  MCHC 33.8 35.5 34.5  RDW 13.7 13.7 13.3  PLT 120* 111* 109*    Cardiac Enzymes Recent Labs  Lab 12/15/18 0826 12/15/18 1142 12/15/18 1755 12/16/18 0016  TROPONINI <0.03 <0.03 <0.03 <0.03    Recent Labs  Lab 12/15/18 1144  TROPIPOC 0.02     BNP Recent Labs  Lab  12/15/18 0826  BNP 289.5*     Radiology    Vas US Renal Artery Duplex  Result Date: 12/15/2018 ABDOMINAL VISCERAL Indications: HTN High Risk Factors: Hypertension. Limitations: Air/bowel gas, patient discomfort and Abdominal pain. Comparison Study: No comparison study available Performing Technologist: Rudell Cobb  Examination Guidelines: A complete evaluation includes B-mode imaging, spectral Doppler, color Doppler, and power Doppler as needed of all accessible portions of each vessel. Bilateral testing is considered an integral part of a complete examination. Limited examinations for reoccurring indications may be performed as noted.  Duplex Findings: +----------------------+--------+--------+------+--------------+ Mesenteric            PSV cm/sEDV cm/sPlaque   Comments    +----------------------+--------+--------+------+--------------+ Aorta Prox              152      13                        +----------------------+--------+--------+------+--------------+ Celiac Artery Proximal  140                                 +----------------------+--------+--------+------+--------------+ SMA Origin              205                 high resistant +----------------------+--------+--------+------+--------------+  +------------------+--------+--------+-------+ Right Renal ArteryPSV cm/sEDV cm/sComment +------------------+--------+--------+-------+ Origin              176      68           +------------------+--------+--------+-------+ Proximal             52      12           +------------------+--------+--------+-------+ Mid                  41      7            +------------------+--------+--------+-------+ Distal               40      10           +------------------+--------+--------+-------+ +-----------------+--------+--------+-------+ Left Renal ArteryPSV cm/sEDV cm/sComment +-----------------+--------+--------+-------+ Origin             142      40           +-----------------+--------+--------+-------+ Proximal            77      14           +-----------------+--------+--------+-------+ Mid                 48      9            +-----------------+--------+--------+-------+ Distal              49      10           +-----------------+--------+--------+-------+ +------------+--------+--------+----+-----------+--------+--------+----+ Right KidneyPSV cm/sEDV cm/sRI  Left KidneyPSV cm/sEDV cm/sRI   +------------+--------+--------+----+-----------+--------+--------+----+ Upper Pole  17      5       0.73Upper Pole 21      6       0.71 +------------+--------+--------+----+-----------+--------+--------+----+ Mid         16      4       0.74Mid        18      5  0.72 +------------+--------+--------+----+-----------+--------+--------+----+ Lower Pole  18      4       0.79Lower Pole 16      4       0.74 +------------+--------+--------+----+-----------+--------+--------+----+ Hilar       24      5       0.79Hilar      23      4       0.83  +------------+--------+--------+----+-----------+--------+--------+----+ +------------------+-----+------------------+-----+ Right Kidney           Left Kidney             +------------------+-----+------------------+-----+ RAR                    RAR                     +------------------+-----+------------------+-----+ RAR (manual)      1.15 RAR (manual)      0.93  +------------------+-----+------------------+-----+ Cortex                 Cortex                  +------------------+-----+------------------+-----+ Cortex thickness       Corex thickness         +------------------+-----+------------------+-----+ Kidney length (cm)10.10Kidney length (cm)11.60 +------------------+-----+------------------+-----+  Summary: Renal:  Right: No evidence of right renal artery stenosis. Abnormal right        Resistive Index. RRV flow present. Normal size right kidney. Left:  No evidence of left renal artery stenosis. Abnormal left        Resisitve Index. LRV flow present. Normal size of left        kidney.  *See table(s) above for measurements and observations.  Diagnosing physician: [name] MD  Electronically signed by [name] MD on 12/15/2018 at 2:35:16 PM.    Final     Cardiac Studies   Echocardiogram 12/15/2018: 1. The left ventricle has moderately reduced systolic function, with an ejection fraction of 35-40%. The cavity size was mildly dilated. Left ventricular diastolic Doppler parameters are consistent with impaired relaxation. 2. Moderate hypokinesis of the basal left ventricular inferoseptal wall. 3. The right ventricle has normal systolic function. The cavity was normal. There is no increase in right ventricular wall thickness. Right ventricular systolic pressure could not be assessed. 4. The mitral valve is normal in structure. Mild calcification of the mitral valve leaflet. There is mild mitral annular calcification present. Mitral valve  regurgitation is mild to moderate by color flow Doppler. 5. The tricuspid valve is normal in structure. 6. The aortic valve is tricuspid. 7. The aortic root is normal in size and structure. 8. There is mild dilatation of the ascending aorta.  Patient Profile     78 y.o. male with a history of multivessel CAD status post CABG in 2013, paroxysmal atrial fibrillation on Eliquis, hypertension, type 2 diabetes mellitus, hyperlipidemia, and PAD.  He presents with recent onset chest pain, brief episodes of PAF, possibly NSVT.  Follow-up echocardiogram shows reduction in LVEF to the range of 35 to 40%.  Assessment & Plan    1.  Unstable angina.  Troponin I levels are negative.  No recurrent chest pain under observation.  2.  Multivessel CAD status post CABG in 2013.  3.  Cardiomyopathy, LVEF recently documented in the range of 35 to 40% with moderate hypokinesis of the basal inferoseptal wall.  This has decreased in comparison to previous evaluation.  4.  Paroxysmal atrial  fibrillation, currently maintaining sinus rhythm.  He is on heparin with Eliquis held in anticipation of diagnostic cardiac catheterization.  5.  CKD stage III, creatinine 1.4-1.5.  Patient is scheduled for diagnostic cardiac catheterization tomorrow for assessment of native and bypass graft anatomy.  Orders have been written.  Continue Lopressor, Norvasc, Imdur, and heparin.  Will also start aspirin for now.  Signed, Rozann Lesches, MD  12/17/2018, 9:19 AM  \

## 2018-12-17 NOTE — H&P (View-Only) (Signed)
Progress Note  Patient Name: Steve [name]. Date of Encounter: 12/17/2018  Primary Cardiologist: Dr. Peter Martinique  Subjective   No recurrent chest pain or shortness of breath.  No palpitations.  No abdominal pain.  Had some transient leg discomfort this morning.  Inpatient Medications    Scheduled Meds: . amLODipine  10 mg Oral Daily  . cilostazol  100 mg Oral BID  . docusate sodium  100 mg Oral Daily  . ferrous sulfate  325 mg Oral Q breakfast  . isosorbide mononitrate  30 mg Oral Daily  . metoprolol tartrate  50 mg Oral BID  . tamsulosin  0.4 mg Oral QHS  . vitamin B-12  500 mcg Oral Daily   Continuous Infusions: . heparin 1,750 Units/hr (12/16/18 2208)   PRN Meds: acetaminophen **OR** acetaminophen, ondansetron **OR** ondansetron (ZOFRAN) IV   Vital Signs    Vitals:   12/16/18 1329 12/16/18 2034 12/16/18 2206 12/17/18 0534  BP: (!) 149/69 (!) 153/73  (!) 147/72  Pulse: 65 74 83 61  Resp:      Temp: (!) 97.2 F (36.2 C) 98 F (36.7 C)  97.9 F (36.6 C)  TempSrc: Oral Oral  Oral  SpO2: 100% 96%  96%  Weight:    81.5 kg  Height:        Intake/Output Summary (Last 24 hours) at 12/17/2018 0919 Last data filed at 12/17/2018 0538 Gross per 24 hour  Intake 591.93 ml  Output 375 ml  Net 216.93 ml   Filed Weights   12/15/18 1506 12/16/18 0622 12/17/18 0534  Weight: 85.4 kg 82.5 kg 81.5 kg    Telemetry    Sinus rhythm.  Personally reviewed.  ECG    Tracing from 12/16/2018 shows a sinus rhythm with LVH, right bundle branch block, left anterior fascicular block.  Personally reviewed.  Physical Exam   GEN:  Elderly male.  No acute distress.   Neck: No JVD. Cardiac: RRR, soft systolic murmur, no gallop.  Respiratory: Nonlabored. Clear to auscultation bilaterally. GI: Soft, nontender, bowel sounds present. MS: No edema; No deformity. Neuro:  Nonfocal. Psych: Alert and oriented x 3. Normal affect.  Labs    Chemistry Recent Labs  Lab 12/15/18 0826  12/16/18 0016 12/17/18 0438  NA 138 136 136  K 4.0 3.4* 3.4*  CL 103 105 104  CO2 26 23 20*  GLUCOSE 164* 119* 142*  BUN 13 15 15   CREATININE 1.51* 1.40* 1.55*  CALCIUM 9.0 8.6* 8.7*  PROT 7.2  --   --   ALBUMIN 3.9  --   --   AST 20  --   --   ALT 14  --   --   ALKPHOS 40  --   --   BILITOT 1.4*  --   --   GFRNONAA 43* 47* 42*  GFRAA 50* 55* 49*  ANIONGAP 9 8 12      Hematology Recent Labs  Lab 12/15/18 0826 12/16/18 0016 12/17/18 0438  WBC 5.7 6.4 5.7  RBC 4.03* 3.52* 3.61*  HGB 13.4 12.1* 11.9*  HCT 39.7 34.1* 34.5*  MCV 98.5 96.9 95.6  MCH 33.3 34.4* 33.0  MCHC 33.8 35.5 34.5  RDW 13.7 13.7 13.3  PLT 120* 111* 109*    Cardiac Enzymes Recent Labs  Lab 12/15/18 0826 12/15/18 1142 12/15/18 1755 12/16/18 0016  TROPONINI <0.03 <0.03 <0.03 <0.03    Recent Labs  Lab 12/15/18 1144  TROPIPOC 0.02     BNP Recent Labs  Lab  12/15/18 0826  BNP 289.5*     Radiology    Vas US Renal Artery Duplex  Result Date: 12/15/2018 ABDOMINAL VISCERAL Indications: HTN High Risk Factors: Hypertension. Limitations: Air/bowel gas, patient discomfort and Abdominal pain. Comparison Study: No comparison study available Performing Technologist: Rudell Cobb  Examination Guidelines: A complete evaluation includes B-mode imaging, spectral Doppler, color Doppler, and power Doppler as needed of all accessible portions of each vessel. Bilateral testing is considered an integral part of a complete examination. Limited examinations for reoccurring indications may be performed as noted.  Duplex Findings: +----------------------+--------+--------+------+--------------+ Mesenteric            PSV cm/sEDV cm/sPlaque   Comments    +----------------------+--------+--------+------+--------------+ Aorta Prox              152      13                        +----------------------+--------+--------+------+--------------+ Celiac Artery Proximal  140                                 +----------------------+--------+--------+------+--------------+ SMA Origin              205                 high resistant +----------------------+--------+--------+------+--------------+  +------------------+--------+--------+-------+ Right Renal ArteryPSV cm/sEDV cm/sComment +------------------+--------+--------+-------+ Origin              176      68           +------------------+--------+--------+-------+ Proximal             52      12           +------------------+--------+--------+-------+ Mid                  41      7            +------------------+--------+--------+-------+ Distal               40      10           +------------------+--------+--------+-------+ +-----------------+--------+--------+-------+ Left Renal ArteryPSV cm/sEDV cm/sComment +-----------------+--------+--------+-------+ Origin             142      40           +-----------------+--------+--------+-------+ Proximal            77      14           +-----------------+--------+--------+-------+ Mid                 48      9            +-----------------+--------+--------+-------+ Distal              49      10           +-----------------+--------+--------+-------+ +------------+--------+--------+----+-----------+--------+--------+----+ Right KidneyPSV cm/sEDV cm/sRI  Left KidneyPSV cm/sEDV cm/sRI   +------------+--------+--------+----+-----------+--------+--------+----+ Upper Pole  17      5       0.73Upper Pole 21      6       0.71 +------------+--------+--------+----+-----------+--------+--------+----+ Mid         16      4       0.74Mid        18      5  0.72 +------------+--------+--------+----+-----------+--------+--------+----+ Lower Pole  18      4       0.79Lower Pole 16      4       0.74 +------------+--------+--------+----+-----------+--------+--------+----+ Hilar       24      5       0.79Hilar      23      4       0.83  +------------+--------+--------+----+-----------+--------+--------+----+ +------------------+-----+------------------+-----+ Right Kidney           Left Kidney             +------------------+-----+------------------+-----+ RAR                    RAR                     +------------------+-----+------------------+-----+ RAR (manual)      1.15 RAR (manual)      0.93  +------------------+-----+------------------+-----+ Cortex                 Cortex                  +------------------+-----+------------------+-----+ Cortex thickness       Corex thickness         +------------------+-----+------------------+-----+ Kidney length (cm)10.10Kidney length (cm)11.60 +------------------+-----+------------------+-----+  Summary: Renal:  Right: No evidence of right renal artery stenosis. Abnormal right        Resistive Index. RRV flow present. Normal size right kidney. Left:  No evidence of left renal artery stenosis. Abnormal left        Resisitve Index. LRV flow present. Normal size of left        kidney.  *See table(s) above for measurements and observations.  Diagnosing physician: [name] MD  Electronically signed by [name] MD on 12/15/2018 at 2:35:16 PM.    Final     Cardiac Studies   Echocardiogram 12/15/2018: 1. The left ventricle has moderately reduced systolic function, with an ejection fraction of 35-40%. The cavity size was mildly dilated. Left ventricular diastolic Doppler parameters are consistent with impaired relaxation. 2. Moderate hypokinesis of the basal left ventricular inferoseptal wall. 3. The right ventricle has normal systolic function. The cavity was normal. There is no increase in right ventricular wall thickness. Right ventricular systolic pressure could not be assessed. 4. The mitral valve is normal in structure. Mild calcification of the mitral valve leaflet. There is mild mitral annular calcification present. Mitral valve  regurgitation is mild to moderate by color flow Doppler. 5. The tricuspid valve is normal in structure. 6. The aortic valve is tricuspid. 7. The aortic root is normal in size and structure. 8. There is mild dilatation of the ascending aorta.  Patient Profile     78 y.o. male with a history of multivessel CAD status post CABG in 2013, paroxysmal atrial fibrillation on Eliquis, hypertension, type 2 diabetes mellitus, hyperlipidemia, and PAD.  He presents with recent onset chest pain, brief episodes of PAF, possibly NSVT.  Follow-up echocardiogram shows reduction in LVEF to the range of 35 to 40%.  Assessment & Plan    1.  Unstable angina.  Troponin I levels are negative.  No recurrent chest pain under observation.  2.  Multivessel CAD status post CABG in 2013.  3.  Cardiomyopathy, LVEF recently documented in the range of 35 to 40% with moderate hypokinesis of the basal inferoseptal wall.  This has decreased in comparison to previous evaluation.  4.  Paroxysmal atrial  fibrillation, currently maintaining sinus rhythm.  He is on heparin with Eliquis held in anticipation of diagnostic cardiac catheterization.  5.  CKD stage III, creatinine 1.4-1.5.  Patient is scheduled for diagnostic cardiac catheterization tomorrow for assessment of native and bypass graft anatomy.  Orders have been written.  Continue Lopressor, Norvasc, Imdur, and heparin.  Will also start aspirin for now.  Signed, Rozann Lesches, MD  12/17/2018, 9:19 AM  \

## 2018-12-18 ENCOUNTER — Encounter (HOSPITAL_COMMUNITY): Payer: Self-pay | Admitting: Interventional Cardiology

## 2018-12-18 ENCOUNTER — Encounter (HOSPITAL_COMMUNITY)
Admission: EM | Disposition: A | Discharge status: Home / Self Care | Payer: Self-pay | Source: Home / Self Care | Attending: Interventional Cardiology

## 2018-12-18 DIAGNOSIS — E782 Mixed hyperlipidemia: Secondary | ICD-10-CM

## 2018-12-18 DIAGNOSIS — I209 Angina pectoris, unspecified: Secondary | ICD-10-CM

## 2018-12-18 DIAGNOSIS — I25119 Atherosclerotic heart disease of native coronary artery with unspecified angina pectoris: Secondary | ICD-10-CM

## 2018-12-18 DIAGNOSIS — I25118 Atherosclerotic heart disease of native coronary artery with other forms of angina pectoris: Secondary | ICD-10-CM

## 2018-12-18 DIAGNOSIS — N184 Chronic kidney disease, stage 4 (severe): Secondary | ICD-10-CM

## 2018-12-18 HISTORY — PX: LEFT HEART CATH AND CORS/GRAFTS ANGIOGRAPHY: CATH118250

## 2018-12-18 LAB — BASIC METABOLIC PANEL
ANION GAP: 10 (ref 5–15)
BUN: 15 mg/dL (ref 8–23)
CO2: 20 mmol/L — ABNORMAL LOW (ref 22–32)
Calcium: 8.7 mg/dL — ABNORMAL LOW (ref 8.9–10.3)
Chloride: 108 mmol/L (ref 98–111)
Creatinine, Ser: 1.52 mg/dL — ABNORMAL HIGH (ref 0.61–1.24)
GFR calc Af Amer: 50 mL/min — ABNORMAL LOW (ref >60)
GFR, EST NON AFRICAN AMERICAN: 43 mL/min — AB (ref >60)
GLUCOSE: 137 mg/dL — AB (ref 70–99)
Potassium: 3.8 mmol/L (ref 3.5–5.1)
Sodium: 138 mmol/L (ref 135–145)

## 2018-12-18 LAB — CBC
HCT: 33.7 % — ABNORMAL LOW (ref 39.0–52.0)
HEMOGLOBIN: 12 g/dL — AB (ref 13.0–17.0)
MCH: 34.3 pg — ABNORMAL HIGH (ref 26.0–34.0)
MCHC: 35.6 g/dL (ref 30.0–36.0)
MCV: 96.3 fL (ref 80.0–100.0)
Platelets: 117 10*3/uL — ABNORMAL LOW (ref 150–400)
RBC: 3.5 MIL/uL — ABNORMAL LOW (ref 4.22–5.81)
RDW: 13.4 % (ref 11.5–15.5)
WBC: 5.6 10*3/uL (ref 4.0–10.5)
nRBC: 0 % (ref 0.0–0.2)

## 2018-12-18 LAB — HEPARIN LEVEL (UNFRACTIONATED): Heparin Unfractionated: 0.7 IU/mL (ref 0.30–0.70)

## 2018-12-18 LAB — APTT: aPTT: 83 seconds — ABNORMAL HIGH (ref 24–36)

## 2018-12-18 LAB — GLUCOSE, CAPILLARY: Glucose-Capillary: 101 mg/dL — ABNORMAL HIGH (ref 70–99)

## 2018-12-18 LAB — POCT ACTIVATED CLOTTING TIME: Activated Clotting Time: 114 seconds

## 2018-12-18 SURGERY — LEFT HEART CATH AND CORS/GRAFTS ANGIOGRAPHY
Anesthesia: LOCAL

## 2018-12-18 MED ORDER — METOPROLOL SUCCINATE ER 100 MG PO TB24
100.0000 mg | ORAL_TABLET | Freq: Every day | ORAL | Status: DC
  Administered 2018-12-18: 100 mg via ORAL
  Filled 2018-12-18: qty 1

## 2018-12-18 MED ORDER — OXYCODONE HCL 5 MG PO TABS: 5.0000 mg | ORAL_TABLET | ORAL | Status: DC | PRN

## 2018-12-18 MED ORDER — APIXABAN 5 MG PO TABS: 5.0000 mg | ORAL_TABLET | Freq: Two times a day (BID) | ORAL | Status: DC

## 2018-12-18 MED ORDER — HEPARIN (PORCINE) IN NACL 1000-0.9 UT/500ML-% IV SOLN
INTRAVENOUS | Status: DC | PRN
  Administered 2018-12-18: 500 mL

## 2018-12-18 MED ORDER — HEPARIN (PORCINE) IN NACL 1000-0.9 UT/500ML-% IV SOLN
INTRAVENOUS | Status: AC
  Filled 2018-12-18: qty 1000

## 2018-12-18 MED ORDER — ISOSORBIDE MONONITRATE ER 30 MG PO TB24: 30.0000 mg | ORAL_TABLET | Freq: Every day | ORAL | 1 refills | Status: DC

## 2018-12-18 MED ORDER — ACETAMINOPHEN 325 MG PO TABS: 650.0000 mg | ORAL_TABLET | ORAL | Status: DC | PRN

## 2018-12-18 MED ORDER — IOHEXOL 350 MG/ML SOLN
INTRAVENOUS | Status: DC | PRN
  Administered 2018-12-18: 80 mL via INTRA_ARTERIAL

## 2018-12-18 MED ORDER — SODIUM CHLORIDE 0.9 % IV SOLN
INTRAVENOUS | Status: AC
  Administered 2018-12-18: 12:00:00 via INTRAVENOUS

## 2018-12-18 MED ORDER — EZETIMIBE 10 MG PO TABS
10.0000 mg | ORAL_TABLET | Freq: Every day | ORAL | Status: DC
  Administered 2018-12-18: 10 mg via ORAL
  Filled 2018-12-18: qty 1

## 2018-12-18 MED ORDER — MIDAZOLAM HCL 2 MG/2ML IJ SOLN
INTRAMUSCULAR | Status: AC
  Filled 2018-12-18: qty 2

## 2018-12-18 MED ORDER — LIDOCAINE HCL (PF) 1 % IJ SOLN
INTRAMUSCULAR | Status: AC
  Filled 2018-12-18: qty 30

## 2018-12-18 MED ORDER — SODIUM CHLORIDE 0.9 % IV SOLN: 250.0000 mL | INTRAVENOUS | Status: DC | PRN

## 2018-12-18 MED ORDER — FENTANYL CITRATE (PF) 100 MCG/2ML IJ SOLN
INTRAMUSCULAR | Status: AC
  Filled 2018-12-18: qty 2

## 2018-12-18 MED ORDER — SODIUM CHLORIDE 0.9% FLUSH: 3.0000 mL | INTRAVENOUS | Status: DC | PRN

## 2018-12-18 MED ORDER — EZETIMIBE 10 MG PO TABS: 10.0000 mg | ORAL_TABLET | Freq: Every day | ORAL | 1 refills | Status: DC

## 2018-12-18 MED ORDER — SODIUM CHLORIDE 0.9 % IV SOLN
INTRAVENOUS | Status: AC | PRN
  Administered 2018-12-18: 82 mL/h via INTRAVENOUS

## 2018-12-18 MED ORDER — METOPROLOL SUCCINATE ER 100 MG PO TB24: 100.0000 mg | ORAL_TABLET | Freq: Every day | ORAL | 3 refills | Status: AC

## 2018-12-18 MED ORDER — FENTANYL CITRATE (PF) 100 MCG/2ML IJ SOLN
INTRAMUSCULAR | Status: DC | PRN
  Administered 2018-12-18: 25 ug via INTRAVENOUS

## 2018-12-18 MED ORDER — MIDAZOLAM HCL 2 MG/2ML IJ SOLN
INTRAMUSCULAR | Status: DC | PRN
  Administered 2018-12-18: 1 mg via INTRAVENOUS

## 2018-12-18 MED ORDER — ONDANSETRON HCL 4 MG/2ML IJ SOLN: 4.0000 mg | Freq: Four times a day (QID) | INTRAMUSCULAR | Status: DC | PRN

## 2018-12-18 MED ORDER — SODIUM CHLORIDE 0.9% FLUSH
3.0000 mL | Freq: Two times a day (BID) | INTRAVENOUS | Status: DC
  Administered 2018-12-18 (×2): 3 mL via INTRAVENOUS

## 2018-12-18 MED ORDER — LIDOCAINE HCL (PF) 1 % IJ SOLN
INTRAMUSCULAR | Status: DC | PRN
  Administered 2018-12-18: 12 mL

## 2018-12-18 MED ORDER — AMLODIPINE BESYLATE 10 MG PO TABS: 10.0000 mg | ORAL_TABLET | Freq: Every day | ORAL | 3 refills | Status: DC

## 2018-12-18 MED ORDER — APIXABAN 5 MG PO TABS
5.0000 mg | ORAL_TABLET | Freq: Two times a day (BID) | ORAL | Status: DC
  Administered 2018-12-18: 5 mg via ORAL
  Filled 2018-12-18: qty 1

## 2018-12-18 SURGICAL SUPPLY — 10 items
CATH INFINITI 5 FR IM (CATHETERS) ×1 IMPLANT
CATH INFINITI 5FR MPB2 (CATHETERS) ×1 IMPLANT
CATH LAUNCHER 5F EBU3.5 (CATHETERS) ×1 IMPLANT
KIT HEART LEFT (KITS) ×2 IMPLANT
PACK CARDIAC CATHETERIZATION (CUSTOM PROCEDURE TRAY) ×2 IMPLANT
SHEATH PINNACLE 5F 10CM (SHEATH) ×1 IMPLANT
SHEATH PROBE COVER 6X72 (BAG) ×1 IMPLANT
TRANSDUCER W/STOPCOCK (MISCELLANEOUS) ×2 IMPLANT
TUBING CIL FLEX 10 FLL-RA (TUBING) ×2 IMPLANT
WIRE EMERALD 3MM-J .035X150CM (WIRE) ×1 IMPLANT

## 2018-12-18 NOTE — Discharge Summary (Addendum)
Discharge Summary    Patient ID: Steve [name], Steve [name]  MRN: [id number], DOB/AGE: March 30, 1941 78 y.o.  Admit date: 12/15/2018 Discharge date: 12/18/2018  Primary Care Provider: Leonard Downing Primary Cardiologist: Dr. Martinique  Discharge Diagnoses    Principal Problem:   PAF (paroxysmal atrial fibrillation) ([place]) Active Problems:   CAD (coronary artery disease)   Ischemic cardiomyopathy   CKD (chronic kidney disease), symptom management only, stage 4 (severe) ([place])   Hyperlipidemia   RBBB   Angina pectoris ([facility])   Allergies Allergies  Allergen Reactions  . Statins Other (See Comments)    Myalgias, muscle weakness    Diagnostic Studies/Procedures    TTE: 12/15/2018  IMPRESSIONS    1. The left ventricle has moderately reduced systolic function, with an ejection fraction of 35-40%. The cavity size was mildly dilated. Left ventricular diastolic Doppler parameters are consistent with impaired relaxation.  2. Moderate hypokinesis of the basal left ventricular inferoseptal wall.  3. The right ventricle has normal systolic function. The cavity was normal. There is no increase in right ventricular wall thickness. Right ventricular systolic pressure could not be assessed.  4. The mitral valve is normal in structure. Mild calcification of the mitral valve leaflet. There is mild mitral annular calcification present. Mitral valve regurgitation is mild to moderate by color flow Doppler.  5. The tricuspid valve is normal in structure.  6. The aortic valve is tricuspid.  7. The aortic root is normal in size and structure.  8. There is mild dilatation of the ascending aorta.  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  Patent LIMA to the LAD.  Heavily calcified left main with patency into the circumflex and ramus intermedius.  Ramus  intermedius contains 70 to 80% stenosis in the proximal to mid vessel at the site of previous bypass graft insertion.  Circumflex is widely patent.  Second obtuse marginal contains segmental 50% proximal narrowing.  Right coronary is nondominant and contains 99% mid vessel stenosis.  LVEDP 22 mmHg.  RECOMMENDATIONS:   Medical therapy for obstructive coronary disease.  There is a PCI target if the patient is having sufficient angina to warrant intervention on the ramus intermedius.  In the absence of symptoms and with negative markers, medical therapy seems most appropriate.  This would also avoid dual antithrombotic therapy and exposure to increased bleeding risk.  Discussed with Dr. Irish Lack. _____________   History of Present Illness    78 y.o. male with a hx of CAD s/p CABG x 4 in 2013, PAF on Eliquis, HTN, DM, PAD, and HLD.  Steve [name] was in his usual state of health until he woke up the morning of admission and was preparing to go down for breakfast. When he started to ambulate he began to feel substernal chest pressure, shortness of breath, and facial flushing. He subsequently sat down and his symptoms subsided. Over the course of the next 2 to 3 hours with any type of movement he had recurrence of the symptoms. He finally told the assisted living facility to call the ambulance. He did have bypass in 2013 but stated that he had never had chest pressure like this before. And since 2013 had had no issues with his heart. He is typically able to ambulate without any kind of chest discomfort or shortness of breath.  Upon EMS arrival they found him to be  resting comfortably in no acute distress. His blood pressure was found to be 217/112. He was hooked up to cardiac monitoring where he had a couple runs of non-sustained ventricular tachycardia. It did not appear that he was symptomatic during these episodes as he stated he had been asymptomatic since just prior to EMS arrival. In the  emergency department he was found to be hypertensive and EKG illustrated sinus rhythm with a right bundle branch block and frequent PVCs. These EKGs are stable compared to prior. EMS strips reviewed by MD showed self terminating episodes of atrial fibrillation  He denied any recent fevers, chills, headaches, rhinorrhea, cough, shortness of breath, abdominal pain, nausea/vomiting, diarrhea, constipation, myalgias, arthralgias, worsening lower extremity edema, increased abdominal girth, PND, orthopnea. Given symptoms he was admitted for further work up.   Hospital Course     Echo showed a decline in his EF to 35-40% with moderate hypokinesis of the basal inferoseptal walls. Given this decline, he was set up for cardiac cath. Eliquis was held on admission and placed on IV heparin. Troponins neg. EKG without ischemia. He underwent cardiac catheterization with occluded SVG-ramus, SVG-LCX/OM, with patent LIMA-LAD. Ramus with 70-80% at the site of graft insertion. Given his symptoms resolved on admission and no recurrence while admitted. It was felt that since no recurrence, will treat medically at this time. Goal will be to control rhythm. Added Zetia this admission given statin intolerance. Consider added PSCK9s as an outpatient. Given his decline in his EF, pletal was stopped. His metoprolol was switched to Toprol XL 100mg  daily, and amlodipine added. Stable post cath, and able to ambulate without complications.   [name] was seen by Dr. Irish Lack and determined stable for discharge home. Follow up in the office has been arranged. Medications are listed below.  _____________  Discharge Vitals Blood pressure (!) 148/62, pulse 66, temperature 97.6 F (36.4 C), temperature source Oral, resp. rate (!) 21, height 5\' 9"  (1.753 m), weight 82.3 kg, SpO2 100 %.  Filed Weights   12/16/18 0622 12/17/18 0534 12/18/18 0640  Weight: 82.5 kg 81.5 kg 82.3 kg    Labs & Radiologic Studies    CBC Recent Labs     12/17/18 0438 12/18/18 0431  WBC 5.7 5.6  HGB 11.9* 12.0*  HCT 34.5* 33.7*  MCV 95.6 96.3  PLT 109* 458*   Basic Metabolic Panel Recent Labs    12/17/18 0438 12/18/18 0431  NA 136 138  K 3.4* 3.8  CL 104 108  CO2 20* 20*  GLUCOSE 142* 137*  BUN 15 15  CREATININE 1.55* 1.52*  CALCIUM 8.7* 8.7*   Liver Function Tests No results for input(s): AST, ALT, ALKPHOS, BILITOT, PROT, ALBUMIN in the last 72 hours. No results for input(s): LIPASE, AMYLASE in the last 72 hours. Cardiac Enzymes Recent Labs    12/15/18 1755 12/16/18 0016  TROPONINI <0.03 <0.03   BNP Invalid input(s): POCBNP D-Dimer No results for input(s): DDIMER in the last 72 hours. Hemoglobin A1C No results for input(s): HGBA1C in the last 72 hours. Fasting Lipid Panel No results for input(s): CHOL, HDL, LDLCALC, TRIG, CHOLHDL, LDLDIRECT in the last 72 hours. Thyroid Function Tests Recent Labs    12/15/18 1755  TSH 2.241   _____________  Dg Chest 2 View  Result Date: 12/15/2018 CLINICAL DATA:  Hot flash and chest pain EXAM: CHEST - 2 VIEW COMPARISON:  September 07, 2018 FINDINGS: The heart size and mediastinal contours are stable. There is no focal infiltrate, pulmonary  edema, or pleural effusion. Mild atelectasis is identified in the right lung base. The visualized skeletal structures are unremarkable. IMPRESSION: No focal pneumonia or pulmonary edema. Mild atelectasis of right lung base. Electronically Signed   By: Abelardo Diesel M.D.   On: 12/15/2018 09:05   Vas US Renal Artery Duplex  Result Date: 12/15/2018 ABDOMINAL VISCERAL Indications: HTN High Risk Factors: Hypertension. Limitations: Air/bowel gas, patient discomfort and Abdominal pain. Comparison Study: No comparison study available Performing Technologist: Rudell Cobb  Examination Guidelines: A complete evaluation includes B-mode imaging, spectral Doppler, color Doppler, and power Doppler as needed of all accessible portions of each vessel.  Bilateral testing is considered an integral part of a complete examination. Limited examinations for reoccurring indications may be performed as noted.  Duplex Findings: +----------------------+--------+--------+------+--------------+ Mesenteric            PSV cm/sEDV cm/sPlaque   Comments    +----------------------+--------+--------+------+--------------+ Aorta Prox              152      13                        +----------------------+--------+--------+------+--------------+ Celiac Artery Proximal  140                                +----------------------+--------+--------+------+--------------+ SMA Origin              205                 high resistant +----------------------+--------+--------+------+--------------+  +------------------+--------+--------+-------+ Right Renal ArteryPSV cm/sEDV cm/sComment +------------------+--------+--------+-------+ Origin              176      68           +------------------+--------+--------+-------+ Proximal             52      12           +------------------+--------+--------+-------+ Mid                  41      7            +------------------+--------+--------+-------+ Distal               40      10           +------------------+--------+--------+-------+ +-----------------+--------+--------+-------+ Left Renal ArteryPSV cm/sEDV cm/sComment +-----------------+--------+--------+-------+ Origin             142      40           +-----------------+--------+--------+-------+ Proximal            77      14           +-----------------+--------+--------+-------+ Mid                 48      9            +-----------------+--------+--------+-------+ Distal              49      10           +-----------------+--------+--------+-------+ +------------+--------+--------+----+-----------+--------+--------+----+ Right KidneyPSV cm/sEDV cm/sRI  Left KidneyPSV cm/sEDV cm/sRI    +------------+--------+--------+----+-----------+--------+--------+----+ Upper Pole  17      5       0.73Upper Pole 21      6       0.71 +------------+--------+--------+----+-----------+--------+--------+----+ Mid  16      4       0.74Mid        18      5       0.72 +------------+--------+--------+----+-----------+--------+--------+----+ Lower Pole  18      4       0.79Lower Pole 16      4       0.74 +------------+--------+--------+----+-----------+--------+--------+----+ Hilar       24      5       0.79Hilar      23      4       0.83 +------------+--------+--------+----+-----------+--------+--------+----+ +------------------+-----+------------------+-----+ Right Kidney           Left Kidney             +------------------+-----+------------------+-----+ RAR                    RAR                     +------------------+-----+------------------+-----+ RAR (manual)      1.15 RAR (manual)      0.93  +------------------+-----+------------------+-----+ Cortex                 Cortex                  +------------------+-----+------------------+-----+ Cortex thickness       Corex thickness         +------------------+-----+------------------+-----+ Kidney length (cm)10.10Kidney length (cm)11.60 +------------------+-----+------------------+-----+  Summary: Renal:  Right: No evidence of right renal artery stenosis. Abnormal right        Resistive Index. RRV flow present. Normal size right kidney. Left:  No evidence of left renal artery stenosis. Abnormal left        Resisitve Index. LRV flow present. Normal size of left        kidney.  *See table(s) above for measurements and observations.  Diagnosing physician: [name] MD  Electronically signed by [name] MD on 12/15/2018 at 2:35:16 PM.    Final    Disposition   Pt is being discharged home today in good condition.  Follow-up Plans & Appointments    Follow-up Information     Martinique, Peter M, MD Follow up.   Specialty:  Cardiology Why:  The office will call you with a follow up appt.  Contact information: [place] [address] [phone]          Discharge Instructions    Call MD for:  redness, tenderness, or signs of infection (pain, swelling, redness, odor or green/yellow discharge around incision site)   Complete by:  As directed    Diet - low sodium heart healthy   Complete by:  As directed    Discharge instructions   Complete by:  As directed    Groin Site Care Refer to this sheet in the next few weeks. These instructions provide you with information on caring for yourself after your procedure. Your caregiver may also give you more specific instructions. Your treatment has been planned according to current medical practices, but problems sometimes occur. Call your caregiver if you have any problems or questions after your procedure. HOME CARE INSTRUCTIONS You may shower 24 hours after the procedure. Remove the bandage (dressing) and gently wash the site with plain soap and water. Gently pat the site dry.  Do not apply powder or lotion to the site.  Do not sit in a bathtub, swimming pool,  or whirlpool for 5 to 7 days.  No bending, squatting, or lifting anything over 10 pounds (4.5 kg) as directed by your caregiver.  Inspect the site at least twice daily.  Do not drive home if you are discharged the same day of the procedure. Have someone else drive you.  You may drive 24 hours after the procedure unless otherwise instructed by your caregiver.  What to expect: Any bruising will usually fade within 1 to 2 weeks.  Blood that collects in the tissue (hematoma) may be painful to the touch. It should usually decrease in size and tenderness within 1 to 2 weeks.  SEEK IMMEDIATE MEDICAL CARE IF: You have unusual pain at the groin site or down the affected leg.  You have redness, warmth, swelling, or pain at the groin site.    You have drainage (other than a small amount of blood on the dressing).  You have chills.  You have a fever or persistent symptoms for more than 72 hours.  You have a fever and your symptoms suddenly get worse.  Your leg becomes pale, cool, tingly, or numb.  You have heavy bleeding from the site. Hold pressure on the site. .  Please do not restart your Eliquis until 8pm this evening. Monitor for signs of bleed, especially around cath site for the next several days.   Increase activity slowly   Complete by:  As directed      Discharge Medications     Medication List    STOP taking these medications   cilostazol 100 MG tablet Commonly known as:  PLETAL   metoprolol tartrate 100 MG tablet Commonly known as:  LOPRESSOR     TAKE these medications   allopurinol 100 MG tablet Commonly known as:  ZYLOPRIM Take 100 mg by mouth daily with breakfast. To prevent gout   amLODipine 10 MG tablet Commonly known as:  NORVASC Take 1 tablet (10 mg total) by mouth daily. Start taking on:  December 19, 2018   apixaban 5 MG Tabs tablet Commonly known as:  ELIQUIS Take 1 tablet (5 mg total) by mouth 2 (two) times daily.   docusate sodium 100 MG capsule Commonly known as:  COLACE Take 100 mg by mouth daily.   ezetimibe 10 MG tablet Commonly known as:  ZETIA Take 1 tablet (10 mg total) by mouth daily. Start taking on:  December 19, 2018   ferrous sulfate 325 (65 FE) MG tablet Take 325 mg by mouth daily with breakfast.   isosorbide mononitrate 30 MG 24 hr tablet Commonly known as:  IMDUR Take 1 tablet (30 mg total) by mouth daily. Start taking on:  December 19, 2018   metoprolol succinate 100 MG 24 hr tablet Commonly known as:  TOPROL-XL Take 1 tablet (100 mg total) by mouth daily. Take with or immediately following a meal. Start taking on:  December 19, 2018   PROBIOTIC & ACIDOPHILUS EX ST Caps Take 1 capsule by mouth 2 (two) times daily. Switch to any p.o. twice daily 10-day  supply probiotic What changed:  additional instructions   tamsulosin 0.4 MG Caps capsule Commonly known as:  FLOMAX Take 0.4 mg by mouth at bedtime.   vitamin B-12 500 MCG tablet Commonly known as:  CYANOCOBALAMIN Take 500 mcg by mouth daily.       Acute coronary syndrome (MI, NSTEMI, STEMI, etc) this admission?: No.     Outstanding Labs/Studies   Consider PCSK9 as an outpatient.   Duration of Discharge Encounter  Greater than 30 minutes including physician time.  Signed, Reino Bellis NP-C 12/18/2018, 4:27 PM   I have examined the patient and reviewed assessment and plan and discussed with patient.  Agree with above as stated.    GEN: Well nourished, well developed, in no acute distress  HEENT: normal  Neck: no JVD, carotid bruits, or masses Cardiac: RRR; no murmurs, rubs, or gallops,no edema  Respiratory:  clear to auscultation bilaterally, normal work of breathing GI: soft, nontender, nondistended,  MS: no deformity or atrophy , no hematoma in the right groin Skin: warm and dry, no rash Neuro:  Strength and sensation are intact Psych: euthymic mood, full affect  OK for discharge.  Eliquis restarted.  If she has angina, could consider PCI of native ramus.  Angina was more likely related to high HR in the setting of AFib.  Eliquis for stroke prevention.  Consider Amio if AFib returns.  Larae Grooms

## 2018-12-18 NOTE — Progress Notes (Deleted)
Pt d/c home. Pt has no new concerns, alert and oriented. Pt is on room air, d/c instruction with teach back provided. Pt verbalize understanding. Pt will be transported out of hospital by family

## 2018-12-18 NOTE — Plan of Care (Signed)
Pt completed goals of car eon this admission

## 2018-12-18 NOTE — Progress Notes (Signed)
Site area: Right groin a 5 french arterial sheath was removed by Allegra Lai RN  Site Prior to Removal:  Level 0  Pressure Applied For 20 MINUTES    Bedrest Beginning at 1115am  Manual:   Yes.    Patient Status During Pull:  stable  Post Pull Groin Site:  Level 0  Post Pull Instructions Given:  Yes.    Post Pull Pulses Present:  Yes.    Dressing Applied:  Yes.    Comments:  VS remain stable

## 2018-12-18 NOTE — Progress Notes (Signed)
Nurse paged with concern for DC plan - patient has no one to go home or stay with him tonight and they are concerned about him being on his own. Family will be available in AM. Given age and cath today will plan to keep overnight and DC in AM once family available to receive patient for observation. Zohar Laing PA-C

## 2018-12-18 NOTE — CV Procedure (Signed)
   Coronary angiography performed via right femoral using real-time vascular ultrasound guidance.  Occluded saphenous vein graft to the ramus.  Occluded saphenous vein graft sequential to the circumflex obtuse marginal.  Patent LIMA to the LAD.  Ramus contains 70 to 80% stenosis at the site of previous graft insertion.  Circumflex is widely patent.  LAD is totally occluded and heavily calcified from distal left main to mid LAD.  Elevated LVEDP, 25 mmHg.  Total contrast 80 cc.  Overall, bypass graft occlusion.  Threatened territory is in the distribution of the ramus intermedius.  Ramus contains a severe stenosis, 75 to 80% at the site of bypass graft insertion.  This could be treated with stent if needed.  Since anginal symptoms are only occurring with episodes of atrial fibrillation, medical therapy for the ramus lesion if able to control angina would prevent dual antithrombotic therapy (Eliquis plus Plavix).  Treatment goal would be to control rhythm and keep the patient in sinus.

## 2018-12-18 NOTE — Progress Notes (Addendum)
Progress Note  Patient Name: Steve [name]. Date of Encounter: 12/18/2018  Primary Cardiologist: No primary care provider on file.   Subjective   Feeling well this morning.   Inpatient Medications    Scheduled Meds: . amLODipine  10 mg Oral Daily  . aspirin EC  81 mg Oral Daily  . cilostazol  100 mg Oral BID  . docusate sodium  100 mg Oral Daily  . ferrous sulfate  325 mg Oral Q breakfast  . isosorbide mononitrate  30 mg Oral Daily  . metoprolol tartrate  50 mg Oral BID  . sodium chloride flush  3 mL Intravenous Q12H  . tamsulosin  0.4 mg Oral QHS  . vitamin B-12  500 mcg Oral Daily   Continuous Infusions: . sodium chloride    . sodium chloride 1 mL/kg/hr (12/18/18 0053)  . heparin 1,750 Units/hr (12/18/18 0324)   PRN Meds: sodium chloride, acetaminophen **OR** acetaminophen, ondansetron **OR** ondansetron (ZOFRAN) IV, sodium chloride flush   Vital Signs    Vitals:   12/17/18 2049 12/17/18 2225 12/18/18 0638 12/18/18 0640  BP: (!) 156/73  138/67   Pulse: 72 72 64   Resp:  19 18   Temp:  97.6 F (36.4 C) 97.8 F (36.6 C)   TempSrc:  Oral    SpO2:  98% 99%   Weight:    82.3 kg  Height:        Intake/Output Summary (Last 24 hours) at 12/18/2018 0834 Last data filed at 12/18/2018 0640 Gross per 24 hour  Intake 1350.96 ml  Output 1051 ml  Net 299.96 ml   Last 3 Weights 12/18/2018 12/17/2018 12/16/2018  Weight (lbs) 181 lb 8 oz 179 lb 11.2 oz 181 lb 12.8 oz  Weight (kg) 82.328 kg 81.511 kg 82.464 kg      Telemetry    SR with BBB  - Personally Reviewed  ECG    SR with LVH and RBBB - Personally Reviewed  Physical Exam   GEN: No acute distress.   Neck: No JVD Cardiac: RRR, no murmurs, rubs, or gallops.  Respiratory: Clear to auscultation bilaterally. GI: Soft, nontender, non-distended  MS: No edema; No deformity. Neuro:  Nonfocal  Psych: Normal affect   Labs    Chemistry Recent Labs  Lab 12/15/18 0826 12/16/18 0016 12/17/18 0438  12/18/18 0431  NA 138 136 136 138  K 4.0 3.4* 3.4* 3.8  CL 103 105 104 108  CO2 26 23 20* 20*  GLUCOSE 164* 119* 142* 137*  BUN 13 15 15 15   CREATININE 1.51* 1.40* 1.55* 1.52*  CALCIUM 9.0 8.6* 8.7* 8.7*  PROT 7.2  --   --   --   ALBUMIN 3.9  --   --   --   AST 20  --   --   --   ALT 14  --   --   --   ALKPHOS 40  --   --   --   BILITOT 1.4*  --   --   --   GFRNONAA 43* 47* 42* 43*  GFRAA 50* 55* 49* 50*  ANIONGAP 9 8 12 10      Hematology Recent Labs  Lab 12/16/18 0016 12/17/18 0438 12/18/18 0431  WBC 6.4 5.7 5.6  RBC 3.52* 3.61* 3.50*  HGB 12.1* 11.9* 12.0*  HCT 34.1* 34.5* 33.7*  MCV 96.9 95.6 96.3  MCH 34.4* 33.0 34.3*  MCHC 35.5 34.5 35.6  RDW 13.7 13.3 13.4  PLT 111* 109* 117*  Cardiac Enzymes Recent Labs  Lab 12/15/18 0826 12/15/18 1142 12/15/18 1755 12/16/18 0016  TROPONINI <0.03 <0.03 <0.03 <0.03    Recent Labs  Lab 12/15/18 1144  TROPIPOC 0.02     BNP Recent Labs  Lab 12/15/18 0826  BNP 289.5*     DDimer No results for input(s): DDIMER in the last 168 hours.   Radiology    No results found.  Cardiac Studies   Echocardiogram 12/15/2018: 1. The left ventricle has moderately reduced systolic function, with an ejection fraction of 35-40%. The cavity size was mildly dilated. Left ventricular diastolic Doppler parameters are consistent with impaired relaxation. 2. Moderate hypokinesis of the basal left ventricular inferoseptal wall. 3. The right ventricle has normal systolic function. The cavity was normal. There is no increase in right ventricular wall thickness. Right ventricular systolic pressure could not be assessed. 4. The mitral valve is normal in structure. Mild calcification of the mitral valve leaflet. There is mild mitral annular calcification present. Mitral valve regurgitation is mild to moderate by color flow Doppler. 5. The tricuspid valve is normal in structure. 6. The aortic valve is tricuspid. 7. The aortic root  is normal in size and structure. 8. There is mild dilatation of the ascending aorta.  Patient Profile     78 y.o. male with a history of multivessel CAD status post CABG in 2013, paroxysmal atrial fibrillation on Eliquis, hypertension, type 2 diabetes mellitus, hyperlipidemia, and PAD. He presents with recent onset chest pain, brief episodes of PAF, possibly NSVT.Follow-up echocardiogram shows reduction in LVEF to the range of 35 to 40%.  Assessment & Plan    1.  Unstable angina: Had a pressure like sensation prior to admission, but none since then.  Troponin I levels are negative.  No recurrent chest pain under observation. Given symptoms and hx, planned for cardiac cath today  2.  Multivessel CAD status post 4v CABG in 2013. -- asa and plavix  3.  Cardiomyopathy: LVEF recently documented in the range of 35 to 40% with moderate hypokinesis of the basal inferoseptal wall. Decline since echo in 2016.  -- switch metoprolol to Toprol XL. May consider ACE/ARB prior to dc if Cr stable. -- given decline in EF will stop pletal as this is contraindicated   4.  Paroxysmal atrial fibrillation: currently maintaining sinus rhythm.  He is on heparin with Eliquis held in anticipation of diagnostic cardiac catheterization.  5.  CKD stage III: creatinine 1.4-1.5.  6. HL: statin intolerance. Will add Zetia. Consider PCSK9s as outpatient.   For questions or updates, please contact [place] Please consult [email or web address] for contact info under    Signed, Reino Bellis, NP  12/18/2018, 8:34 AM     I have examined the patient and reviewed assessment and plan and discussed with patient.  Agree with above as stated.   I personally reviewed the cath films.  He has disease in a ramus, but his angina has been controlled since his heart rate is been controlled.  I agree with medical therapy.  We will restart Eliquis tomorrow given the cath today.  Possible discharge later today.  Larae Grooms

## 2018-12-18 NOTE — Interval H&P Note (Signed)
Cath Lab Visit (complete for each Cath Lab visit)  Clinical Evaluation Leading to the Procedure:   ACS: Yes.    Non-ACS:    Anginal Classification: CCS [name]  Anti-ischemic medical therapy: Minimal Therapy (1 class of medications)  Non-Invasive Test Results: No non-invasive testing performed  Prior CABG: Previous CABG      History and Physical Interval Note:  12/18/2018 9:44 AM  [name]  has presented today for surgery, with the diagnosis of unstable angina  The various methods of treatment have been discussed with the patient and family. After consideration of risks, benefits and other options for treatment, the patient has consented to  Procedure(s): LEFT HEART CATH AND CORS/GRAFTS ANGIOGRAPHY (N/A) as a surgical intervention .  The patient's history has been reviewed, patient examined, no change in status, stable for surgery.  I have reviewed the patient's chart and labs.  Questions were answered to the patient's satisfaction.     Steve [name]

## 2018-12-18 NOTE — Progress Notes (Signed)
ANTICOAGULATION CONSULT NOTE - Follow Up Consult  Pharmacy Consult for heparin Indication: AFib + DVT  Patient Measurements: Heparin Dosing Weight: 86 kg  Vital Signs: Temp: 97.8 F (36.6 C) (02/24 0638) Temp Source: Oral (02/23 2225) BP: 138/67 (02/24 3419) Pulse Rate: 64 (02/24 0638)  Labs: Recent Labs    12/15/18 1142  12/15/18 1755  12/16/18 0016 12/16/18 6222 12/16/18 1832 12/17/18 0438 12/17/18 1157 12/18/18 0431  HGB  --   --   --   --  12.1*  --   --  11.9*  --  12.0*  HCT  --   --   --   --  34.1*  --   --  34.5*  --  33.7*  PLT  --   --   --   --  111*  --   --  109*  --  117*  APTT  --    < >  --    < >  --  35 64* 100*  --  83*  LABPROT  --   --   --   --   --   --   --   --  13.7  --   INR  --   --   --   --   --   --   --   --  1.06  --   HEPARINUNFRC  --   --   --   --   --  0.76*  --  0.86*  --  0.70  CREATININE  --   --   --   --  1.40*  --   --  1.55*  --  1.52*  TROPONINI <0.03  --  <0.03  --  <0.03  --   --   --   --   --    < > = values in this interval not displayed.    Assessment: 80 yoM on apixaban PTA for PAF and remote DVT (last dose 2/21 pm) admitted with CP and started on IV heparin. Heparin levels remain skewed by DOAC use, aPTT within therapeutic range. CBC stable, cardiac cath planned today.   Goal of Therapy:  Heparin level 0.3-0.7 units/ml aPTT 66-102 seconds Monitor platelets by anticoagulation protocol: Yes   Plan:  -Continue heparin gtt at 1750 units/hr -Daily heparin level/aPTT and CBC -Monitor s/sx of bleeding   Arrie Senate, PharmD, BCPS Clinical Pharmacist [phone] Please check AMION for all [place] numbers 12/18/2018

## 2018-12-18 NOTE — Plan of Care (Signed)
  Problem: Clinical Measurements: Goal: Will remain free from infection Outcome: Progressing Note:  No s/s of infection noted.   Problem: Coping: Goal: Level of anxiety will decrease Outcome: Progressing Note:  No s/s of anxiety noted.

## 2018-12-19 LAB — CBC
HCT: 32.5 % — ABNORMAL LOW (ref 39.0–52.0)
Hemoglobin: 11.2 g/dL — ABNORMAL LOW (ref 13.0–17.0)
MCH: 33.5 pg (ref 26.0–34.0)
MCHC: 34.5 g/dL (ref 30.0–36.0)
MCV: 97.3 fL (ref 80.0–100.0)
NRBC: 0 % (ref 0.0–0.2)
PLATELETS: 108 10*3/uL — AB (ref 150–400)
RBC: 3.34 MIL/uL — ABNORMAL LOW (ref 4.22–5.81)
RDW: 13.6 % (ref 11.5–15.5)
WBC: 5.4 10*3/uL (ref 4.0–10.5)

## 2018-12-19 LAB — LIPID PANEL
Cholesterol: 136 mg/dL (ref 0–200)
HDL: 38 mg/dL — ABNORMAL LOW (ref >40)
LDL Cholesterol: 65 mg/dL (ref 0–99)
Total CHOL/HDL Ratio: 3.6 RATIO
Triglycerides: 166 mg/dL — ABNORMAL HIGH (ref <150)
VLDL: 33 mg/dL (ref 0–40)

## 2018-12-19 NOTE — Progress Notes (Signed)
Pt stable, DC home with his son via wheelchair

## 2019-01-03 NOTE — Progress Notes (Signed)
Cardiology Office Note   Date:  01/04/2019   ID:  Steve Mask., DOB [birth year], MRN [id number]  PCP:  Steve Downing, MD  Cardiologist:  Dr.Jordan  No chief complaint on file.    History of Present Illness: Steve [name]. is a 78 y.o. male who presents for post hospitalization follow up with known history of CAD s/p CABG X 4 in 2013, PAF with anticoagulation on Eliquis, HTN, PAD, HL and DM. He was admitted with chest pain. EMS documented BP of 217/112, and non-sustained ventricular tachycardia.   Echocardiogram revealed reduced EF of 35%-40%, and moderate hypokinesis of the basal inferoseptal walls. Given this decreased EF, cardiac cath was planned, Cath revealed occluded SVG-ramus, SVG-LCX/OM, with patent LIMA-LAD. Ramus with 70-80% at the site of graft insertion. He was switched to Toprol XL 100 mg daily. Amlodipine was added, Due to reduced EF Pletal was discontinued. He has a history of statin intolerance, and therefore Zeta was started and consideration for addition of PSCK-9 inhibitors would be discussed on follow up. LDL was found to be 65, however, and therefore may not be needed at this time.   He comes today without any complaints. He is walking each day without symptoms. He is medically compliant. He still has some soreness in his legs with walking but this does not stop him from doing so. He denies fluid retention.   Past Medical History:  Diagnosis Date  . CAD (coronary artery disease)    a. s/p CABG x 29 Nov 2011 with LIMA to LAD, SVG to OM1 and distal LCX, SVG to ramus intermediate  . Essential hypertension   . Hyperlipidemia   . Intermittent claudication ([place])   . Ischemic cardiomyopathy    a. 03/2015 Echo: EF45-50%, Gr1 DD, mild MR.  Marland Kitchen PAF (paroxysmal atrial fibrillation) ([place])    a. post op atrial fib 11/2011; short course of amiodarone; stopped 01/25/12  . Peripheral arterial disease ([place])   . Right bundle branch block   . Type II diabetes mellitus ([place])      Past Surgical History:  Procedure Laterality Date  . CARDIAC CATHETERIZATION  01/16/1999   Est. EF of 65% -- Nonobstruction atherosclerotic coronary artery disease -- Normal left ventricular function      . CARDIAC ELECTROPHYSIOLOGY STUDY AND ABLATION  05/21/1999   Normal sinus funtion -- Mildly prolonged interatrial conduction times -- Normal A-V node funtion -- Normal His Purkinje system function -- fNo accessory pathway -- No inducible ventricular tachycardia in the presence of the or in the absence of isoproterenol with programmed stimulation or with burst pacing -- Nikki Dom, M.D. FACC surgan          . CHOLECYSTECTOMY  1999  . CORONARY ARTERY BYPASS GRAFT  12/10/2011   Procedure: CORONARY ARTERY BYPASS GRAFTING (CABG);  Surgeon: Tharon Aquas Adelene Idler, MD;  Location: [facility];  Service: Open Heart Surgery;  Laterality: N/A;  Coronary Artery bypass graft on pump times four utilizing left internal mammary artery and left saphenous vein harvested endoscopically  . LEFT HEART CATH AND CORS/GRAFTS ANGIOGRAPHY N/A 12/18/2018   Procedure: LEFT HEART CATH AND CORS/GRAFTS ANGIOGRAPHY;  Surgeon: Belva Crome, MD;  Location: [facility] CV LAB;  Service: Cardiovascular;  Laterality: N/A;  . LEFT HEART CATHETERIZATION WITH CORONARY ANGIOGRAM N/A 12/09/2011   Procedure: LEFT HEART CATHETERIZATION WITH CORONARY ANGIOGRAM;  Surgeon: Peter M Martinique, MD;  [facility];  Service: Cardiovascular;  Laterality: N/A;  . LOWER EXTREMITY ANGIOGRAM  Left 07/28/2018   Procedure: Lower Extremity Angiogram;  Surgeon: Angelia Mould, MD;  Location: [facility] CV LAB;  Service: Cardiovascular;  Laterality: Left;     Current Outpatient Medications  Medication Sig Dispense Refill  . allopurinol (ZYLOPRIM) 100 MG tablet Take 100 mg by mouth daily with breakfast. To prevent gout    . amLODipine (NORVASC) 10 MG tablet Take 1 tablet (10 mg total) by mouth daily. 30 tablet 3  . apixaban (ELIQUIS) 5  MG TABS tablet Take 1 tablet (5 mg total) by mouth 2 (two) times daily. 60 tablet 0  . docusate sodium (COLACE) 100 MG capsule Take 100 mg by mouth daily.    Marland Kitchen ezetimibe (ZETIA) 10 MG tablet Take 1 tablet (10 mg total) by mouth daily. 30 tablet 1  . ferrous sulfate 325 (65 FE) MG tablet Take 325 mg by mouth daily with breakfast.    . isosorbide mononitrate (IMDUR) 30 MG 24 hr tablet Take 1 tablet (30 mg total) by mouth daily. 30 tablet 1  . metoprolol succinate (TOPROL-XL) 100 MG 24 hr tablet Take 1 tablet (100 mg total) by mouth daily. Take with or immediately following a meal. 30 tablet 3  . Probiotic Product (PROBIOTIC & ACIDOPHILUS EX ST) CAPS Take 1 capsule by mouth 2 (two) times daily. Switch to any p.o. twice daily 10-day supply probiotic (Patient taking differently: Take 1 capsule by mouth 2 (two) times daily. ) 20 capsule 0  . tamsulosin (FLOMAX) 0.4 MG CAPS capsule Take 0.4 mg by mouth at bedtime.    . vitamin B-12 (CYANOCOBALAMIN) 500 MCG tablet Take 500 mcg by mouth daily.     No current facility-administered medications for this visit.     Allergies:   Statins    Social History:  The patient  reports that he quit smoking about 39 years ago. His smoking use included cigarettes. He has a 30.00 pack-year smoking history. He has quit using smokeless tobacco. He reports current alcohol use of about 4.0 standard drinks of alcohol per week. He reports that he does not use drugs.   Family History:  The patient's family history includes Stroke (age of onset: 33) in his mother.    ROS: All other systems are reviewed and negative. Unless otherwise mentioned in H&P    PHYSICAL EXAM: VS:  BP (!) 148/64   Pulse 74   Ht 5\' 9"  (1.753 m)   Wt 193 lb (87.5 kg)   SpO2 97%   BMI 28.50 kg/m  , BMI Body mass index is 28.5 kg/m. GEN: Well nourished, well developed, in no acute distress HEENT: normal Neck: no JVD, carotid bruits, or masses Cardiac: RRR 1/6 systolic murmurs, rubs, or  gallops,no edema  Respiratory:  Clear to auscultation bilaterally, normal work of breathing GI: soft, nontender, nondistended, + BS MS: no deformity or atrophy Skin: warm and dry, no rash Neuro:  Strength and sensation are intact Psych: euthymic mood, full affect   EKG: Not competed this office visit.   Recent Labs: 12/15/2018: ALT 14; B Natriuretic Peptide 289.5; Magnesium 1.7; TSH 2.241 12/18/2018: BUN 15; Creatinine, Ser 1.52; Potassium 3.8; Sodium 138 12/19/2018: Hemoglobin 11.2; Platelets 108    Lipid Panel    Component Value Date/Time   CHOL 136 12/19/2018 0327   TRIG 166 (H) 12/19/2018 0327   HDL 38 (L) 12/19/2018 0327   CHOLHDL 3.6 12/19/2018 0327   VLDL 33 12/19/2018 0327   LDLCALC 65 12/19/2018 0327   LDLDIRECT 112.4 02/25/2012 0855  Wt Readings from Last 3 Encounters:  01/04/19 193 lb (87.5 kg)  12/19/18 180 lb 4.8 oz (81.8 kg)  09/07/18 190 lb 0.6 oz (86.2 kg)      Other studies Reviewed: TTE: 12/15/2018  IMPRESSIONS   1. The left ventricle has moderately reduced systolic function, with an ejection fraction of 35-40%. The cavity size was mildly dilated. Left ventricular diastolic Doppler parameters are consistent with impaired relaxation. 2. Moderate hypokinesis of the basal left ventricular inferoseptal wall. 3. The right ventricle has normal systolic function. The cavity was normal. There is no increase in right ventricular wall thickness. Right ventricular systolic pressure could not be assessed. 4. The mitral valve is normal in structure. Mild calcification of the mitral valve leaflet. There is mild mitral annular calcification present. Mitral valve regurgitation is mild to moderate by color flow Doppler. 5. The tricuspid valve is normal in structure. 6. The aortic valve is tricuspid. 7. The aortic root is normal in size and structure. 8. There is mild dilatation of the ascending aorta.  Cath: 12/18/2018   Bypass graft failure with  occluded saphenous vein sequential graft to the obtuse marginal and distal circumflex, and saphenous vein graft to the ramus intermedius.  Patent LIMA to the LAD.  Heavily calcified left main with patency into the circumflex and ramus intermedius.  Ramus intermedius contains 70 to 80% stenosis in the proximal to mid vessel at the site of previous bypass graft insertion.  Circumflex is widely patent. Second obtuse marginal contains segmental 50% proximal narrowing  Right coronary is nondominant and contains 99% mid vessel stenosis.  LVEDP 22 mmHg.  RECOMMENDATIONS:   Medical therapy for obstructive coronary disease. There is a PCI target if the patient is having sufficient angina to warrant intervention on the ramus intermedius. In the absence of symptoms and with negative markers, medical therapy seems most appropriate. This would also avoid dual antithrombotic therapy and exposure to increased bleeding risk.  Discussed with Dr. Irish Lack.   ASSESSMENT AND PLAN:  1.  CAD: Recent hospitalization for chest discomfort. Cardiac cath as above. He is being treated medically. He denies any cardiac complaints. He will be followed closely for new symptoms. He is encouraged to continue to be active. Will see him again in 3 months.   2. Hypertension: BP is much better controlled than during hospitalization. Amlodipine was added and he was switched to Toprol XL 100 mg daily. He is medically compliant.. No changes in his regimen at this time.   3. ICM: EF of 35%-40%. He is euvolemic at this time, some impaired relaxation noted on echo. He is not currently on a diuretic.   4. PAF Continues on Eliquis. No complaints of bleeding.   5. PAD: He was taken off of Pletal during admission due to reduced EF. He denies significant pain at this time, and continues to walk everyday.   6. Hypercholesterolemia: He remains on Zetia and is not tolerant of statin. Most recent labs revealed LDL of 65. Will  follow this in 6 months. : Current medicines are reviewed at length with the patient today.    Labs/ tests ordered today include: None  Phill Myron. [place] Pugh, ANP, AACC   01/04/2019 [place] [address] Office [phone] Fax [phone]

## 2019-01-04 ENCOUNTER — Ambulatory Visit (INDEPENDENT_AMBULATORY_CARE_PROVIDER_SITE_OTHER): Payer: Medicare Other | Admitting: Adult Health

## 2019-01-04 ENCOUNTER — Encounter: Payer: Self-pay | Admitting: Adult Health

## 2019-01-04 VITALS — BP 148/64 | HR 74 | Ht 69.0 in | Wt 193.0 lb

## 2019-01-04 DIAGNOSIS — I739 Peripheral vascular disease, unspecified: Secondary | ICD-10-CM | POA: Diagnosis not present

## 2019-01-04 DIAGNOSIS — I43 Cardiomyopathy in diseases classified elsewhere: Secondary | ICD-10-CM | POA: Diagnosis not present

## 2019-01-04 DIAGNOSIS — I5022 Chronic systolic (congestive) heart failure: Secondary | ICD-10-CM

## 2019-01-04 DIAGNOSIS — I4510 Unspecified right bundle-branch block: Secondary | ICD-10-CM

## 2019-01-04 DIAGNOSIS — I2510 Atherosclerotic heart disease of native coronary artery without angina pectoris: Secondary | ICD-10-CM

## 2019-01-04 NOTE — Patient Instructions (Signed)
Follow-Up: Keep your scheduled follow up appointment WITH Peter Martinique, MD or one of the following Advanced Practice Providers on your designated Care Team:  Almyra Deforest, PA-C  Fabian Sharp, [place]   Medication Instructions:  NO CHANGES- Your physician recommends that you continue on your current medications as directed. Please refer to the Current Medication list given to you today. If you need a refill on your cardiac medications before your next appointment, please call your pharmacy. Labwork: When you have labs (blood work) and your tests are completely normal, you will receive your results ONLY by [facility] (if you have MyChart) -OR- A paper copy in the mail.  At [facility], you and your health needs are our priority.  As part of our continuing mission to provide you with exceptional heart care, we have created designated Provider Care Teams.  These Care Teams include your primary Cardiologist (physician) and Advanced Practice Providers (APPs -  Physician Assistants and Nurse Practitioners) who all work together to provide you with the care you need, when you need it.  Thank you for choosing [facility] at [facility]!!

## 2019-02-08 ENCOUNTER — Other Ambulatory Visit: Payer: Self-pay | Admitting: Cardiology

## 2019-02-08 NOTE — Telephone Encounter (Signed)
This is Dr. Jordan's pt. °

## 2019-02-08 NOTE — Telephone Encounter (Signed)
Isosorbide and zetia refilled.

## 2019-02-26 ENCOUNTER — Ambulatory Visit: Payer: Medicare Other | Admitting: Cardiology

## 2019-04-09 ENCOUNTER — Other Ambulatory Visit: Payer: Self-pay | Admitting: Cardiology

## 2019-04-16 ENCOUNTER — Encounter (HOSPITAL_COMMUNITY): Payer: Self-pay

## 2019-04-16 ENCOUNTER — Inpatient Hospital Stay (HOSPITAL_COMMUNITY)
Admission: EM | Admit: 2019-04-16 | Discharge: 2019-04-18 | DRG: 639 | Disposition: A | Discharge status: Home / Self Care | Payer: Medicare Other | Attending: Internal Medicine | Admitting: Internal Medicine

## 2019-04-16 ENCOUNTER — Observation Stay (HOSPITAL_COMMUNITY): Payer: Medicare Other

## 2019-04-16 ENCOUNTER — Other Ambulatory Visit: Payer: Self-pay

## 2019-04-16 DIAGNOSIS — E1110 Type 2 diabetes mellitus with ketoacidosis without coma: Secondary | ICD-10-CM | POA: Diagnosis not present

## 2019-04-16 DIAGNOSIS — E1151 Type 2 diabetes mellitus with diabetic peripheral angiopathy without gangrene: Secondary | ICD-10-CM | POA: Diagnosis present

## 2019-04-16 DIAGNOSIS — Z9049 Acquired absence of other specified parts of digestive tract: Secondary | ICD-10-CM

## 2019-04-16 DIAGNOSIS — R739 Hyperglycemia, unspecified: Secondary | ICD-10-CM | POA: Diagnosis not present

## 2019-04-16 DIAGNOSIS — I10 Essential (primary) hypertension: Secondary | ICD-10-CM

## 2019-04-16 DIAGNOSIS — I480 Paroxysmal atrial fibrillation: Secondary | ICD-10-CM

## 2019-04-16 DIAGNOSIS — I2510 Atherosclerotic heart disease of native coronary artery without angina pectoris: Secondary | ICD-10-CM | POA: Diagnosis present

## 2019-04-16 DIAGNOSIS — Z1159 Encounter for screening for other viral diseases: Secondary | ICD-10-CM

## 2019-04-16 DIAGNOSIS — Z951 Presence of aortocoronary bypass graft: Secondary | ICD-10-CM

## 2019-04-16 DIAGNOSIS — E860 Dehydration: Secondary | ICD-10-CM | POA: Diagnosis present

## 2019-04-16 DIAGNOSIS — E876 Hypokalemia: Secondary | ICD-10-CM | POA: Diagnosis present

## 2019-04-16 DIAGNOSIS — N183 Chronic kidney disease, stage 3 (moderate): Secondary | ICD-10-CM | POA: Diagnosis present

## 2019-04-16 DIAGNOSIS — I129 Hypertensive chronic kidney disease with stage 1 through stage 4 chronic kidney disease, or unspecified chronic kidney disease: Secondary | ICD-10-CM | POA: Diagnosis present

## 2019-04-16 DIAGNOSIS — I255 Ischemic cardiomyopathy: Secondary | ICD-10-CM | POA: Diagnosis not present

## 2019-04-16 DIAGNOSIS — Z823 Family history of stroke: Secondary | ICD-10-CM

## 2019-04-16 DIAGNOSIS — E785 Hyperlipidemia, unspecified: Secondary | ICD-10-CM | POA: Diagnosis present

## 2019-04-16 DIAGNOSIS — E1100 Type 2 diabetes mellitus with hyperosmolarity without nonketotic hyperglycemic-hyperosmolar coma (NKHHC): Principal | ICD-10-CM | POA: Diagnosis present

## 2019-04-16 DIAGNOSIS — E1122 Type 2 diabetes mellitus with diabetic chronic kidney disease: Secondary | ICD-10-CM | POA: Diagnosis present

## 2019-04-16 DIAGNOSIS — Z87891 Personal history of nicotine dependence: Secondary | ICD-10-CM

## 2019-04-16 DIAGNOSIS — Z66 Do not resuscitate: Secondary | ICD-10-CM | POA: Diagnosis present

## 2019-04-16 DIAGNOSIS — E119 Type 2 diabetes mellitus without complications: Secondary | ICD-10-CM

## 2019-04-16 DIAGNOSIS — Z7901 Long term (current) use of anticoagulants: Secondary | ICD-10-CM

## 2019-04-16 LAB — BASIC METABOLIC PANEL
Anion gap: 11 (ref 5–15)
Anion gap: 10 (ref 5–15)
Anion gap: 8 (ref 5–15)
BUN: 11 mg/dL (ref 8–23)
BUN: 10 mg/dL (ref 8–23)
BUN: 9 mg/dL (ref 8–23)
CO2: 18 mmol/L — ABNORMAL LOW (ref 22–32)
CO2: 17 mmol/L — ABNORMAL LOW (ref 22–32)
CO2: 18 mmol/L — ABNORMAL LOW (ref 22–32)
Calcium: 8.3 mg/dL — ABNORMAL LOW (ref 8.9–10.3)
Calcium: 8 mg/dL — ABNORMAL LOW (ref 8.9–10.3)
Calcium: 7.9 mg/dL — ABNORMAL LOW (ref 8.9–10.3)
Chloride: 94 mmol/L — ABNORMAL LOW (ref 98–111)
Chloride: 99 mmol/L (ref 98–111)
Chloride: 105 mmol/L (ref 98–111)
Creatinine, Ser: 1.5 mg/dL — ABNORMAL HIGH (ref 0.61–1.24)
Creatinine, Ser: 1.31 mg/dL — ABNORMAL HIGH (ref 0.61–1.24)
Creatinine, Ser: 1.21 mg/dL (ref 0.61–1.24)
GFR calc Af Amer: 50 mL/min — ABNORMAL LOW (ref >60)
GFR calc Af Amer: 59 mL/min — ABNORMAL LOW (ref >60)
GFR calc Af Amer: >60 mL/min (ref >60)
GFR calc non Af Amer: 43 mL/min — ABNORMAL LOW (ref >60)
GFR calc non Af Amer: 51 mL/min — ABNORMAL LOW (ref >60)
GFR calc non Af Amer: 56 mL/min — ABNORMAL LOW (ref >60)
Glucose, Bld: 651 mg/dL (ref 70–99)
Glucose, Bld: 499 mg/dL — ABNORMAL HIGH (ref 70–99)
Glucose, Bld: 288 mg/dL — ABNORMAL HIGH (ref 70–99)
Potassium: 3 mmol/L — ABNORMAL LOW (ref 3.5–5.1)
Potassium: 2.8 mmol/L — ABNORMAL LOW (ref 3.5–5.1)
Potassium: 3.3 mmol/L — ABNORMAL LOW (ref 3.5–5.1)
Sodium: 123 mmol/L — ABNORMAL LOW (ref 135–145)
Sodium: 126 mmol/L — ABNORMAL LOW (ref 135–145)
Sodium: 131 mmol/L — ABNORMAL LOW (ref 135–145)

## 2019-04-16 LAB — POCT I-STAT EG7
Acid-base deficit: 5 mmol/L — ABNORMAL HIGH (ref 0.0–2.0)
Bicarbonate: 19 mmol/L — ABNORMAL LOW (ref 20.0–28.0)
Calcium, Ion: 1.13 mmol/L — ABNORMAL LOW (ref 1.15–1.40)
HCT: 28 % — ABNORMAL LOW (ref 39.0–52.0)
Hemoglobin: 9.5 g/dL — ABNORMAL LOW (ref 13.0–17.0)
O2 Saturation: 90 %
Potassium: 3.1 mmol/L — ABNORMAL LOW (ref 3.5–5.1)
Sodium: 125 mmol/L — ABNORMAL LOW (ref 135–145)
TCO2: 20 mmol/L — ABNORMAL LOW (ref 22–32)
pCO2, Ven: 29.5 mmHg — ABNORMAL LOW (ref 44.0–60.0)
pH, Ven: 7.416 (ref 7.250–7.430)
pO2, Ven: 56 mmHg — ABNORMAL HIGH (ref 32.0–45.0)

## 2019-04-16 LAB — CBC
HCT: 27.5 % — ABNORMAL LOW (ref 39.0–52.0)
Hemoglobin: 10.2 g/dL — ABNORMAL LOW (ref 13.0–17.0)
MCH: 32.9 pg (ref 26.0–34.0)
MCHC: 37.1 g/dL — ABNORMAL HIGH (ref 30.0–36.0)
MCV: 88.7 fL (ref 80.0–100.0)
Platelets: 143 10*3/uL — ABNORMAL LOW (ref 150–400)
RBC: 3.1 MIL/uL — ABNORMAL LOW (ref 4.22–5.81)
RDW: 11.9 % (ref 11.5–15.5)
WBC: 6.4 10*3/uL (ref 4.0–10.5)
nRBC: 0 % (ref 0.0–0.2)

## 2019-04-16 LAB — CBG MONITORING, ED
Glucose-Capillary: >600 mg/dL (ref 70–99)
Glucose-Capillary: 449 mg/dL — ABNORMAL HIGH (ref 70–99)
Glucose-Capillary: 364 mg/dL — ABNORMAL HIGH (ref 70–99)
Glucose-Capillary: 345 mg/dL — ABNORMAL HIGH (ref 70–99)

## 2019-04-16 LAB — GLUCOSE, CAPILLARY: Glucose-Capillary: 206 mg/dL — ABNORMAL HIGH (ref 70–99)

## 2019-04-16 LAB — URINALYSIS, ROUTINE W REFLEX MICROSCOPIC
Bacteria, UA: NONE SEEN
Bilirubin Urine: NEGATIVE
Glucose, UA: >=500 mg/dL — AB
Hgb urine dipstick: NEGATIVE
Ketones, ur: NEGATIVE mg/dL
Leukocytes,Ua: NEGATIVE
Nitrite: NEGATIVE
Protein, ur: NEGATIVE mg/dL
Specific Gravity, Urine: 1.018 (ref 1.005–1.030)
pH: 5 (ref 5.0–8.0)

## 2019-04-16 LAB — SARS CORONAVIRUS 2 BY RT PCR (HOSPITAL ORDER, PERFORMED IN ~~LOC~~ HOSPITAL LAB): SARS Coronavirus 2: NEGATIVE

## 2019-04-16 MED ORDER — POTASSIUM CHLORIDE CRYS ER 20 MEQ PO TBCR
40.0000 meq | EXTENDED_RELEASE_TABLET | Freq: Once | ORAL | Status: AC
  Administered 2019-04-16: 40 meq via ORAL
  Filled 2019-04-16: qty 2

## 2019-04-16 MED ORDER — DEXTROSE-NACL 5-0.45 % IV SOLN: INTRAVENOUS | Status: DC

## 2019-04-16 MED ORDER — INSULIN REGULAR(HUMAN) IN NACL 100-0.9 UT/100ML-% IV SOLN: INTRAVENOUS | Status: DC

## 2019-04-16 MED ORDER — ONDANSETRON HCL 4 MG PO TABS: 4.0000 mg | ORAL_TABLET | Freq: Four times a day (QID) | ORAL | Status: DC | PRN

## 2019-04-16 MED ORDER — DEXTROSE-NACL 5-0.45 % IV SOLN
INTRAVENOUS | Status: DC
  Administered 2019-04-16: 23:00:00 via INTRAVENOUS

## 2019-04-16 MED ORDER — APIXABAN 5 MG PO TABS
5.0000 mg | ORAL_TABLET | Freq: Two times a day (BID) | ORAL | Status: DC
  Administered 2019-04-16 – 2019-04-18 (×4): 5 mg via ORAL
  Filled 2019-04-16 (×5): qty 1

## 2019-04-16 MED ORDER — VITAMIN B-12 100 MCG PO TABS
500.0000 ug | ORAL_TABLET | Freq: Every day | ORAL | Status: DC
  Administered 2019-04-17 – 2019-04-18 (×2): 500 ug via ORAL
  Filled 2019-04-16 (×2): qty 5

## 2019-04-16 MED ORDER — POTASSIUM CHLORIDE 10 MEQ/100ML IV SOLN
10.0000 meq | INTRAVENOUS | Status: AC
  Administered 2019-04-16 (×4): 10 meq via INTRAVENOUS
  Filled 2019-04-16 (×4): qty 100

## 2019-04-16 MED ORDER — ONDANSETRON HCL 4 MG/2ML IJ SOLN: 4.0000 mg | Freq: Four times a day (QID) | INTRAMUSCULAR | Status: DC | PRN

## 2019-04-16 MED ORDER — SODIUM CHLORIDE 0.9 % IV SOLN
INTRAVENOUS | Status: DC
  Administered 2019-04-16: 19:00:00 via INTRAVENOUS

## 2019-04-16 MED ORDER — EZETIMIBE 10 MG PO TABS
10.0000 mg | ORAL_TABLET | Freq: Every day | ORAL | Status: DC
  Administered 2019-04-17: 10 mg via ORAL
  Filled 2019-04-16 (×2): qty 1

## 2019-04-16 MED ORDER — ACETAMINOPHEN 650 MG RE SUPP: 650.0000 mg | Freq: Four times a day (QID) | RECTAL | Status: DC | PRN

## 2019-04-16 MED ORDER — SODIUM CHLORIDE 0.9 % IV SOLN
INTRAVENOUS | Status: AC
  Administered 2019-04-16: 18:00:00 via INTRAVENOUS

## 2019-04-16 MED ORDER — AMLODIPINE BESYLATE 10 MG PO TABS
10.0000 mg | ORAL_TABLET | Freq: Every day | ORAL | Status: DC
  Administered 2019-04-16 – 2019-04-17 (×2): 10 mg via ORAL
  Filled 2019-04-16: qty 1
  Filled 2019-04-16: qty 2

## 2019-04-16 MED ORDER — INSULIN REGULAR(HUMAN) IN NACL 100-0.9 UT/100ML-% IV SOLN
INTRAVENOUS | Status: DC
  Administered 2019-04-16: 3.9 [IU]/h via INTRAVENOUS
  Filled 2019-04-16: qty 100

## 2019-04-16 MED ORDER — ACETAMINOPHEN 325 MG PO TABS
650.0000 mg | ORAL_TABLET | Freq: Four times a day (QID) | ORAL | Status: DC | PRN
  Administered 2019-04-17: 650 mg via ORAL
  Filled 2019-04-16: qty 2

## 2019-04-16 MED ORDER — SODIUM CHLORIDE 0.9 % IV BOLUS
1000.0000 mL | Freq: Once | INTRAVENOUS | Status: AC
  Administered 2019-04-16: 1000 mL via INTRAVENOUS

## 2019-04-16 MED ORDER — POTASSIUM CHLORIDE CRYS ER 20 MEQ PO TBCR: 40.0000 meq | EXTENDED_RELEASE_TABLET | ORAL | Status: DC

## 2019-04-16 MED ORDER — SODIUM CHLORIDE 0.9 % IV SOLN
INTRAVENOUS | Status: DC
  Administered 2019-04-16: 18:00:00 via INTRAVENOUS

## 2019-04-16 MED ORDER — METOPROLOL SUCCINATE ER 100 MG PO TB24
100.0000 mg | ORAL_TABLET | Freq: Every day | ORAL | Status: DC
  Administered 2019-04-16 – 2019-04-17 (×2): 100 mg via ORAL
  Filled 2019-04-16 (×2): qty 1

## 2019-04-16 MED ORDER — TAMSULOSIN HCL 0.4 MG PO CAPS
0.4000 mg | ORAL_CAPSULE | Freq: Every day | ORAL | Status: DC
  Administered 2019-04-16 – 2019-04-17 (×2): 0.4 mg via ORAL
  Filled 2019-04-16 (×2): qty 1

## 2019-04-16 MED ORDER — DOCUSATE SODIUM 100 MG PO CAPS
100.0000 mg | ORAL_CAPSULE | Freq: Every day | ORAL | Status: DC
  Administered 2019-04-17 – 2019-04-18 (×2): 100 mg via ORAL
  Filled 2019-04-16 (×2): qty 1

## 2019-04-16 MED ORDER — ISOSORBIDE MONONITRATE ER 30 MG PO TB24
30.0000 mg | ORAL_TABLET | Freq: Every day | ORAL | Status: DC
  Administered 2019-04-17 – 2019-04-18 (×2): 30 mg via ORAL
  Filled 2019-04-16 (×2): qty 1

## 2019-04-16 NOTE — ED Notes (Signed)
CBG=364 

## 2019-04-16 NOTE — ED Notes (Signed)
Admitting MD at bedside to assess pt for admission 

## 2019-04-16 NOTE — ED Notes (Signed)
Pt lives at [facility] facility.

## 2019-04-16 NOTE — ED Provider Notes (Addendum)
Medical screening examination/treatment/procedure(s) were conducted as a shared visit with non-physician practitioner(s) and myself.  I personally evaluated the patient during the encounter.  EKG Interpretation  Date/Time:  Monday April 16 2019 14:11:00 EDT Ventricular Rate:  73 PR Interval:    QRS Duration: 157 QT Interval:  485 QTC Calculation: 535 R Axis:   -76 Text Interpretation:  Sinus rhythm Left atrial enlargement Right bundle branch block Left ventricular hypertrophy Inferior infarct, old Probable anterior infarct, age indeterminate No significant change since last tracing Confirmed by Merrily Pew [id number]) on 04/16/2019 3:40:18 PM  Patient has been having sporadic headaches.  He does not qualify the most severe.  When he stood up today he felt lightheaded and "saw stars".  He has not had fever, visual changes, weakness numbness or tingling of extremities.  Patient is alert and appropriate.  Movements are coordinated purposeful symmetric.  No neurologic deficit.  Patient's blood sugar is significantly elevated.  He had been taken off of his metformin and is untreated at this time.  Not show signs of mental status change.  With anion gap and significant elevation at patient's age we will initiate insulin drip and plan to admit for hyperosmolar hyperglycemia without encehpalopathy.   CRITICAL CARE Performed by: Charlesetta Shanks   Total critical care time: 30  minutes  Critical care time was exclusive of separately billable procedures and treating other patients.  Critical care was necessary to treat or prevent imminent or life-threatening deterioration.  Critical care was time spent personally by me on the following activities: development of treatment plan with patient and/or surrogate as well as nursing, discussions with consultants, evaluation of patient's response to treatment, examination of patient, obtaining history from patient or surrogate, ordering and performing treatments  and interventions, ordering and review of laboratory studies, ordering and review of radiographic studies, pulse oximetry and re-evaluation of patient's condition. Charlesetta Shanks, MD 04/26/19 [place], MD 04/26/19 1051

## 2019-04-16 NOTE — ED Notes (Signed)
Reports being taken off of medications over a year ago for an A1C of 5.0

## 2019-04-16 NOTE — ED Notes (Signed)
ED TO INPATIENT HANDOFF REPORT  ED Nurse Name and Phone #: Aeris Hersman [phone]  S Name/Age/Gender [name] 78 y.o. male Room/Bed: 013C/013C  Code Status   Code Status: DNR  Home/SNF/Other Nursing Home Patient oriented to: self, place, time and situation Is this baseline? Yes   Triage Complete: Triage complete  Chief Complaint ha  Triage Note Patient stood up today and saw "stars"  He reports intermittent headaches recently.  His blood sugar read high on ems equipment .   No pain at this time.    Allergies Allergies  Allergen Reactions  . Statins Other (See Comments)    Myalgias, muscle weakness    Level of Care/Admitting Diagnosis ED Disposition    ED Disposition Condition [facility]: [place] [[id number]]  Level of Care: Progressive [102]  I expect the patient will be discharged within 24 hours: No (not a candidate for 5C-Observation unit)  Covid Evaluation: Screening Protocol (No Symptoms)  Diagnosis: DKA (diabetic ketoacidoses) [facility]) [761950]  Admitting Physician: Aline August [[id number]]  Attending Physician: Aline August [[id number]]  PT Class (Do Not Modify): Observation [104]  PT Acc Code (Do Not Modify): Observation [10022]       B Medical/Surgery History Past Medical History:  Diagnosis Date  . CAD (coronary artery disease)    a. s/p CABG x 29 Nov 2011 with LIMA to LAD, SVG to OM1 and distal LCX, SVG to ramus intermediate  . Essential hypertension   . Hyperlipidemia   . Intermittent claudication ([place])   . Ischemic cardiomyopathy    a. 03/2015 Echo: EF45-50%, Gr1 DD, mild MR.  Marland Kitchen PAF (paroxysmal atrial fibrillation) ([place])    a. post op atrial fib 11/2011; short course of amiodarone; stopped 01/25/12  . Peripheral arterial disease ([place])   . Right bundle branch block   . Type II diabetes mellitus ([place])    Past Surgical History:  Procedure Laterality Date  . CARDIAC CATHETERIZATION  01/16/1999   Est. EF of 65%  -- Nonobstruction atherosclerotic coronary artery disease -- Normal left ventricular function      . CARDIAC ELECTROPHYSIOLOGY STUDY AND ABLATION  05/21/1999   Normal sinus funtion -- Mildly prolonged interatrial conduction times -- Normal A-V node funtion -- Normal His Purkinje system function -- fNo accessory pathway -- No inducible ventricular tachycardia in the presence of the or in the absence of isoproterenol with programmed stimulation or with burst pacing -- Nikki Dom, M.D. [facility] surgan          . CHOLECYSTECTOMY  1999  . CORONARY ARTERY BYPASS GRAFT  12/10/2011   Procedure: CORONARY ARTERY BYPASS GRAFTING (CABG);  Surgeon: Tharon Aquas Adelene Idler, MD;  Location: [facility];  Service: Open Heart Surgery;  Laterality: N/A;  Coronary Artery bypass graft on pump times four utilizing left internal mammary artery and left saphenous vein harvested endoscopically  . LEFT HEART CATH AND CORS/GRAFTS ANGIOGRAPHY N/A 12/18/2018   Procedure: LEFT HEART CATH AND CORS/GRAFTS ANGIOGRAPHY;  Surgeon: Belva Crome, MD;  Location: [facility];  Service: Cardiovascular;  Laterality: N/A;  . LEFT HEART CATHETERIZATION WITH CORONARY ANGIOGRAM N/A 12/09/2011   Procedure: LEFT HEART CATHETERIZATION WITH CORONARY ANGIOGRAM;  Surgeon: Peter M Martinique, MD;  Location: [facility] CATH LAB;  Service: Cardiovascular;  Laterality: N/A;  . LOWER EXTREMITY ANGIOGRAM Left 07/28/2018   Procedure: Lower Extremity Angiogram;  Surgeon: Angelia Mould, MD;  Location: [place] [facility];  Service: Cardiovascular;  Laterality: Left;  A IV Location/Drains/Wounds Patient Lines/Drains/Airways Status   Active Line/Drains/Airways    Name:   Placement date:   Placement time:   Site:   Days:   Peripheral IV 04/16/19 Right Forearm   04/16/19    1455    Forearm   less than 1          Intake/Output Last 24 hours  Intake/Output Summary (Last 24 hours) at 04/16/2019 1938 Last data filed at 04/16/2019 1938 Gross per 24 hour   Intake -  Output 400 ml  Net -400 ml    Labs/Imaging Results for orders placed or performed during the hospital encounter of 04/16/19 (from the past 48 hour(s))  CBG monitoring, ED     Status: Abnormal   Collection Time: 04/16/19  2:07 PM  Result Value Ref Range   Glucose-Capillary >600 (HH) 70 - 99 mg/dL  Basic metabolic panel     Status: Abnormal   Collection Time: 04/16/19  2:52 PM  Result Value Ref Range   Sodium 123 (L) 135 - 145 mmol/L   Potassium 3.0 (L) 3.5 - 5.1 mmol/L   Chloride 94 (L) 98 - 111 mmol/L   CO2 18 (L) 22 - 32 mmol/L   Glucose, Bld 651 (HH) 70 - 99 mg/dL    Comment: CRITICAL RESULT CALLED TO, READ BACK BY AND VERIFIED WITH: K BROWN RN AT 1534 ON [id number] BY K FORSYTH    BUN 11 8 - 23 mg/dL   Creatinine, Ser 1.50 (H) 0.61 - 1.24 mg/dL   Calcium 8.3 (L) 8.9 - 10.3 mg/dL   GFR calc non Af Amer 43 (L) >60 mL/min   GFR calc Af Amer 50 (L) >60 mL/min   Anion gap 11 5 - 15    Comment: Performed at [facility], [address] [place], [place] [address]  CBC     Status: Abnormal   Collection Time: 04/16/19  2:52 PM  Result Value Ref Range   WBC 6.4 4.0 - 10.5 K/uL   RBC 3.10 (L) 4.22 - 5.81 MIL/uL   Hemoglobin 10.2 (L) 13.0 - 17.0 g/dL   HCT 27.5 (L) 39.0 - 52.0 %   MCV 88.7 80.0 - 100.0 fL   MCH 32.9 26.0 - 34.0 pg   MCHC 37.1 (H) 30.0 - 36.0 g/dL   RDW 11.9 11.5 - 15.5 %   Platelets 143 (L) 150 - 400 K/uL   nRBC 0.0 0.0 - 0.2 %    Comment: Performed at [facility], [place] [address], [place], [place] [address]  POCT I-Stat EG7     Status: Abnormal   Collection Time: 04/16/19  2:59 PM  Result Value Ref Range   pH, Ven 7.416 7.250 - 7.430   pCO2, Ven 29.5 (L) 44.0 - 60.0 mmHg   pO2, Ven 56.0 (H) 32.0 - 45.0 mmHg   Bicarbonate 19.0 (L) 20.0 - 28.0 mmol/L   TCO2 20 (L) 22 - 32 mmol/L   O2 Saturation 90.0 %   Acid-base deficit 5.0 (H) 0.0 - 2.0 mmol/L   Sodium 125 (L) 135 - 145 mmol/L   Potassium 3.1 (L) 3.5 - 5.1 mmol/L   Calcium, Ion  1.13 (L) 1.15 - 1.40 mmol/L   HCT 28.0 (L) 39.0 - 52.0 %   Hemoglobin 9.5 (L) 13.0 - 17.0 g/dL   Patient temperature HIDE    Sample type VENOUS   Basic metabolic panel     Status: Abnormal   Collection Time: 04/16/19  5:28 PM  Result Value Ref Range   Sodium 126 (L) 135 - 145 mmol/L   Potassium 2.8 (L) 3.5 - 5.1 mmol/L   Chloride 99 98 - 111 mmol/L   CO2 17 (L) 22 - 32 mmol/L   Glucose, Bld 499 (H) 70 - 99 mg/dL   BUN 10 8 - 23 mg/dL   Creatinine, Ser 1.31 (H) 0.61 - 1.24 mg/dL   Calcium 8.0 (L) 8.9 - 10.3 mg/dL   GFR calc non Af Amer 51 (L) >60 mL/min   GFR calc Af Amer 59 (L) >60 mL/min   Anion gap 10 5 - 15    Comment: Performed at [facility] [address] [place], [place] [address]  SARS Coronavirus 2 (CEPHEID - Performed in [place] hospital lab), Hosp Order     Status: None   Collection Time: 04/16/19  5:38 PM   Specimen: Nasopharyngeal Swab  Result Value Ref Range   SARS Coronavirus 2 NEGATIVE NEGATIVE    Comment: (NOTE) If result is NEGATIVE SARS-CoV-2 target nucleic acids are NOT DETECTED. The SARS-CoV-2 RNA is generally detectable in upper and lower  respiratory specimens during the acute phase of infection. The lowest  concentration of SARS-CoV-2 viral copies this assay can detect is 250  copies / mL. A negative result does not preclude SARS-CoV-2 infection  and should not be used as the sole basis for treatment or other  patient management decisions.  A negative result may occur with  improper specimen collection / handling, submission of specimen other  than nasopharyngeal swab, presence of viral mutation(s) within the  areas targeted by this assay, and inadequate number of viral copies  (<250 copies / mL). A negative result must be combined with clinical  observations, patient history, and epidemiological information. If result is POSITIVE SARS-CoV-2 target nucleic acids are DETECTED. The SARS-CoV-2 RNA is generally detectable in upper and lower   respiratory specimens dur ing the acute phase of infection.  Positive  results are indicative of active infection with SARS-CoV-2.  Clinical  correlation with patient history and other diagnostic information is  necessary to determine patient infection status.  Positive results do  not rule out bacterial infection or co-infection with other viruses. If result is PRESUMPTIVE POSTIVE SARS-CoV-2 nucleic acids MAY BE PRESENT.   A presumptive positive result was obtained on the submitted specimen  and confirmed on repeat testing.  While 2019 novel coronavirus  (SARS-CoV-2) nucleic acids may be present in the submitted sample  additional confirmatory testing may be necessary for epidemiological  and / or clinical management purposes  to differentiate between  SARS-CoV-2 and other Sarbecovirus currently known to infect humans.  If clinically indicated additional testing with an alternate test  methodology [phone]) is advised. The SARS-CoV-2 RNA is generally  detectable in upper and lower respiratory sp ecimens during the acute  phase of infection. The expected result is Negative. Fact Sheet for Patients:  StrictlyIdeas.no Fact Sheet for Healthcare Providers: BankingDealers.co.za This test is not yet approved or cleared by the [place] FDA and has been authorized for detection and/or diagnosis of SARS-CoV-2 by FDA under an Emergency Use Authorization (EUA).  This EUA will remain in effect (meaning this test can be used) for the duration of the COVID-19 declaration under Section 564(b)(1) of the Act, 21 U.S.C. section 360bbb-3(b)(1), unless the authorization is terminated or revoked sooner. Performed at [name], [place] [address] [place], [place] [address]   Urinalysis, Routine w reflex microscopic  Status: Abnormal   Collection Time: 04/16/19  6:19 PM  Result Value Ref Range   Color, Urine STRAW (A) YELLOW   APPearance  CLEAR CLEAR   Specific Gravity, Urine 1.018 1.005 - 1.030   pH 5.0 5.0 - 8.0   Glucose, UA >=500 (A) NEGATIVE mg/dL   Hgb urine dipstick NEGATIVE NEGATIVE   Bilirubin Urine NEGATIVE NEGATIVE   Ketones, ur NEGATIVE NEGATIVE mg/dL   Protein, ur NEGATIVE NEGATIVE mg/dL   Nitrite NEGATIVE NEGATIVE   Leukocytes,Ua NEGATIVE NEGATIVE   WBC, UA 0-5 0 - 5 WBC/hpf   Bacteria, UA NONE SEEN NONE SEEN    Comment: Performed at [facility] [address], [place], [place] [address]  CBG monitoring, ED     Status: Abnormal   Collection Time: 04/16/19  6:38 PM  Result Value Ref Range   Glucose-Capillary 449 (H) 70 - 99 mg/dL   Comment 1 Notify RN    Dg Chest 2 View  Result Date: 04/16/2019 CLINICAL DATA:  Patient stood up today and saw "stars" He reports intermittent headaches recently. His blood sugar read high on ems equipment. Per pt no chest pain nor other symptom now. Hx of diabetes, HTN, CAD, PAF, Right BBB, ischemic cardiomyopathy. Elevated Glucose EXAM: CHEST - 2 VIEW COMPARISON:  None. FINDINGS: Sternotomy wires overlie normal cardiac silhouette. Normal pulmonary vasculature. No effusion, infiltrate, or pneumothorax. No acute osseous abnormality. Of 5 IMPRESSION: No acute cardiopulmonary process. Electronically Signed   By: Suzy Bouchard M.D.   On: 04/16/2019 16:49    Pending Labs Unresulted Labs (From admission, onward)    Start     Ordered   04/17/19 0500  CBC  Tomorrow morning,   R     04/16/19 1649   04/17/19 0500  Comprehensive metabolic panel  Tomorrow morning,   R     04/16/19 1649   04/17/19 0500  Hemoglobin A1c  Tomorrow morning,   R     04/16/19 1652   04/16/19 4010  Basic metabolic panel  Now then every 4 hours,   R (with STAT occurrences)     04/16/19 1651          Vitals/Pain Today's Vitals   04/16/19 1715 04/16/19 1820 04/16/19 1845 04/16/19 1930  BP:  (!) 150/66 (!) 151/65   Pulse:   65   Resp:  16 18   Temp:  98.3 F (36.8 C)    TempSrc:  Oral     SpO2:  98% 98%   Weight:      Height:      PainSc: 0-No pain   0-No pain    Isolation Precautions No active isolations  Medications Medications  sodium chloride 0.9 % bolus 1,000 mL (0 mLs Intravenous Stopped 04/16/19 1830)    And  0.9 %  sodium chloride infusion ( Intravenous Stopped 04/16/19 1929)  amLODipine (NORVASC) tablet 10 mg (has no administration in time range)  ezetimibe (ZETIA) tablet 10 mg (has no administration in time range)  isosorbide mononitrate (IMDUR) 24 hr tablet 30 mg (has no administration in time range)  metoprolol succinate (TOPROL-XL) 24 hr tablet 100 mg (has no administration in time range)  docusate sodium (COLACE) capsule 100 mg (has no administration in time range)  tamsulosin (FLOMAX) capsule 0.4 mg (has no administration in time range)  vitamin B-12 (CYANOCOBALAMIN) tablet 500 mcg (has no administration in time range)  apixaban (ELIQUIS) tablet 5 mg (has no administration in time range)  0.9 %  sodium  chloride infusion ( Intravenous Stopped 04/16/19 1931)  0.9 %  sodium chloride infusion ( Intravenous Bolus from Bag 04/16/19 1826)  dextrose 5 %-0.45 % sodium chloride infusion (has no administration in time range)  insulin regular, human (MYXREDLIN) 100 units/ 100 mL infusion (3.9 Units/hr Intravenous New Bag/Given 04/16/19 1909)  acetaminophen (TYLENOL) tablet 650 mg (has no administration in time range)    Or  acetaminophen (TYLENOL) suppository 650 mg (has no administration in time range)  ondansetron (ZOFRAN) tablet 4 mg (has no administration in time range)    Or  ondansetron (ZOFRAN) injection 4 mg (has no administration in time range)  potassium chloride 10 mEq in 100 mL IVPB (10 mEq Intravenous New Bag/Given 04/16/19 1842)  sodium chloride 0.9 % bolus 1,000 mL (0 mLs Intravenous Stopped 04/16/19 1829)  potassium chloride SA (K-DUR) CR tablet 40 mEq (40 mEq Oral Given 04/16/19 1841)    Mobility walks with device Moderate fall risk   Focused  Assessments Cardiac Assessment Handoff:    Lab Results  Component Value Date   CKTOTAL 57 07/25/2018   TROPONINI <0.03 12/16/2018   No results found for: DDIMER Does the Patient currently have chest pain? No     R Recommendations: See Admitting Provider Note  Report given to:   Additional Notes:  CBG check due again at 2010

## 2019-04-16 NOTE — ED Notes (Signed)
Order food tray .  

## 2019-04-16 NOTE — ED Provider Notes (Signed)
[facility] EMERGENCY DEPARTMENT Provider Note   CSN: [id number] Arrival date & time: 04/16/19  1400     History   Chief Complaint Chief Complaint  Patient presents with   Headache    HPI Steve [name]. is a 77 y.o. male.     HPI Patient presents to the emergency department after seeing stars and a slight headache earlier today.  The patient states that his blood pressure was checked at the facility and it was normal but that when EMS arrived they checked his blood sugar and it was elevated.  Patient states he was taken off his diabetes medications which was metformin 1 year ago by his doctor.  Patient states he has had no other symptoms and had no other issues recently.  The patient denies chest pain, shortness of breath,blurred vision, neck pain, fever, cough, weakness, numbness, dizziness, anorexia, edema, abdominal pain, nausea, vomiting, diarrhea, rash, back pain, dysuria, hematemesis, bloody stool, near syncope, or syncope. Past Medical History:  Diagnosis Date   CAD (coronary artery disease)    a. s/p CABG x 29 Nov 2011 with LIMA to LAD, SVG to OM1 and distal LCX, SVG to ramus intermediate   Essential hypertension    Hyperlipidemia    Intermittent claudication ([place])    Ischemic cardiomyopathy    a. 03/2015 Echo: EF45-50%, Gr1 DD, mild MR.   PAF (paroxysmal atrial fibrillation) ([place])    a. post op atrial fib 11/2011; short course of amiodarone; stopped 01/25/12   Peripheral arterial disease ([place])    Right bundle branch block    Type II diabetes mellitus [facility]     Patient Active Problem List   Diagnosis Date Noted   Atrial fibrillation ([place]) 12/15/2018   CKD (chronic kidney disease), symptom management only, stage 4 (severe) ([place]) 09/08/2018   Sepsis ([place]) 09/07/2018   Diabetic ulcer of toe of left foot associated with type 2 diabetes mellitus ([place])    Cellulitis of left anterior lower leg 07/25/2018   Iron deficiency anemia 07/24/2018     Acute kidney injury superimposed on chronic kidney disease ([place]) 07/24/2018   Hyponatremia 07/24/2018   Hypokalemia 08/18/8526   Metabolic acidosis 78/24/2353   Acute renal failure (ARF) ([place]) 07/24/2018   Normocytic anemia 07/24/2018   Thrombocytopenia ([place]) 07/24/2018   Ischemic cardiomyopathy    CAD (coronary artery disease)    PAF (paroxysmal atrial fibrillation) ([place])    Essential hypertension    Type II diabetes mellitus ([place])    Near syncope 04/01/2015   Dizziness and giddiness 04/01/2015   Fatigue 04/01/2015   Chronic renal disease, stage II 04/01/2015   RBBB 04/01/2015   Angina pectoris ([place]) 04/01/2015   PAT (paroxysmal atrial tachycardia) ([place]) 12/16/2011   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   Diabetes mellitus, type 2 ([place]) 05/31/2011   Hypertension    PVD (peripheral vascular disease) ([place])    Hyperlipidemia     Past Surgical History:  Procedure Laterality Date   CARDIAC CATHETERIZATION  01/16/1999   Est. EF of 65% -- Nonobstruction atherosclerotic coronary artery disease -- Normal left ventricular function       CARDIAC ELECTROPHYSIOLOGY STUDY AND ABLATION  05/21/1999   Normal sinus funtion -- Mildly prolonged interatrial conduction times -- Normal A-V node funtion -- Normal His Purkinje system function -- fNo accessory pathway -- No inducible ventricular tachycardia in the presence of the or in the absence of isoproterenol with programmed stimulation or with burst pacing -- Nikki Dom, M.D. [facility] surgan  CHOLECYSTECTOMY  1999   CORONARY ARTERY BYPASS GRAFT  12/10/2011   Procedure: CORONARY ARTERY BYPASS GRAFTING (CABG);  Surgeon: Tharon Aquas Adelene Idler, MD;  Location: [facility];  Service: Open Heart Surgery;  Laterality: N/A;  Coronary Artery bypass graft on pump times four utilizing left internal mammary artery and left saphenous vein harvested endoscopically   LEFT HEART CATH AND CORS/GRAFTS ANGIOGRAPHY N/A 12/18/2018   Procedure: LEFT  HEART CATH AND CORS/GRAFTS ANGIOGRAPHY;  Surgeon: Belva Crome, MD;  Location: [facility] [facility];  Service: Cardiovascular;  Laterality: N/A;   LEFT HEART CATHETERIZATION WITH CORONARY ANGIOGRAM N/A 12/09/2011   Procedure: LEFT HEART CATHETERIZATION WITH CORONARY ANGIOGRAM;  Surgeon: Peter M Martinique, MD;  Location: [facility] CATH LAB;  Service: Cardiovascular;  Laterality: N/A;   LOWER EXTREMITY ANGIOGRAM Left 07/28/2018   Procedure: Lower Extremity Angiogram;  Surgeon: Angelia Mould, MD;  Location: [place];  Service: Cardiovascular;  Laterality: Left;        Home Medications    Prior to Admission medications   Medication Sig Start Date End Date Taking? Authorizing Provider  allopurinol (ZYLOPRIM) 100 MG tablet Take 100 mg by mouth daily with breakfast. To prevent gout 07/18/18   [provider]  amLODipine (NORVASC) 10 MG tablet Take 1 tablet by mouth once daily 04/09/19   Cheryln Manly, NP  apixaban (ELIQUIS) 5 MG TABS tablet Take 1 tablet (5 mg total) by mouth 2 (two) times daily. 07/29/18   Ina Homes, MD  docusate sodium (COLACE) 100 MG capsule Take 100 mg by mouth daily.    [provider]  ezetimibe (ZETIA) 10 MG tablet Take 1 tablet by mouth once daily 02/08/19   Martinique, Peter M, MD  ferrous sulfate 325 (65 FE) MG tablet Take 325 mg by mouth daily with breakfast.    [provider]  isosorbide mononitrate (IMDUR) 30 MG 24 hr tablet Take 1 tablet by mouth once daily 02/08/19   Martinique, Peter M, MD  metoprolol succinate (TOPROL-XL) 100 MG 24 hr tablet Take 1 tablet (100 mg total) by mouth daily. Take with or immediately following a meal. 12/19/18   Cheryln Manly, NP  Probiotic Product (PROBIOTIC & ACIDOPHILUS EX ST) CAPS Take 1 capsule by mouth 2 (two) times daily. Switch to any p.o. twice daily 10-day supply probiotic Patient taking differently: Take 1 capsule by mouth 2 (two) times daily.  09/10/18   Thurnell Lose, MD  tamsulosin  (FLOMAX) 0.4 MG CAPS capsule Take 0.4 mg by mouth at bedtime. 11/01/18   [provider]  vitamin B-12 (CYANOCOBALAMIN) 500 MCG tablet Take 500 mcg by mouth daily.    [provider]  furosemide (LASIX) 40 MG tablet Take 1 tablet (40 mg total) by mouth daily. For 4 days then stop. 12/15/11 01/05/12  Nani Skillern, PA-C    Family History Family History  Problem Relation Age of Onset   Stroke Mother 48       Cerebellar hemorrhage    Social History Social History   Tobacco Use   Smoking status: Former Smoker    Packs/day: 1.50    Years: 20.00    Pack years: 30.00    Types: Cigarettes    Quit date: 05/27/1979    Years since quitting: 39.9   Smokeless tobacco: Former Systems developer  Substance Use Topics   Alcohol use: Yes    Alcohol/week: 4.0 standard drinks    Types: 4 Shots of liquor per week  Comment: 4 "canadian clubs each night"   Drug use: No     Allergies   Statins   Review of Systems Review of Systems All other systems negative except as documented in the HPI. All pertinent positives and negatives as reviewed in the HPI.  Physical Exam Updated Vital Signs BP 139/73    Pulse 71    Temp 98.2 F (36.8 C) (Oral)    Resp 16    Ht 5\' 9"  (1.753 m)    Wt 87.1 kg    SpO2 97%    BMI 28.35 kg/m   Physical Exam Vitals signs and nursing note reviewed.  Constitutional:      General: He is not in acute distress.    Appearance: He is well-developed.  HENT:     Head: Normocephalic and atraumatic.  Eyes:     Pupils: Pupils are equal, round, and reactive to light.  Neck:     Musculoskeletal: Normal range of motion and neck supple.  Cardiovascular:     Rate and Rhythm: Normal rate and regular rhythm.     Heart sounds: Normal heart sounds. No murmur. No friction rub. No gallop.   Pulmonary:     Effort: Pulmonary effort is normal. No respiratory distress.     Breath sounds: Normal breath sounds. No wheezing.  Abdominal:     General: Bowel sounds are  normal. There is no distension.     Palpations: Abdomen is soft.     Tenderness: There is no abdominal tenderness.  Skin:    General: Skin is warm and dry.     Capillary Refill: Capillary refill takes less than 2 seconds.     Findings: No erythema or rash.  Neurological:     Mental Status: He is alert and oriented to person, place, and time.     Motor: No abnormal muscle tone.     Coordination: Coordination normal.  Psychiatric:        Behavior: Behavior normal.      ED Treatments / Results  Labs (all labs ordered are listed, but only abnormal results are displayed) Labs Reviewed  CBG MONITORING, ED - Abnormal; Notable for the following components:      Result Value   Glucose-Capillary >600 (*)    All other components within normal limits  BASIC METABOLIC PANEL  CBC  URINALYSIS, ROUTINE W REFLEX MICROSCOPIC  CBG MONITORING, ED  I-STAT VENOUS BLOOD GAS, ED    EKG    Radiology No results found.  Procedures Procedures (including critical care time)  Medications Ordered in ED Medications  sodium chloride 0.9 % bolus 1,000 mL (1,000 mLs Intravenous New Bag/Given 04/16/19 1455)     Initial Impression / Assessment and Plan / ED Course  I have reviewed the triage vital signs and the nursing notes.  Pertinent labs & imaging results that were available during my care of the patient were reviewed by me and considered in my medical decision making (see chart for details).        Patient is having no symptoms at this time.  The patient will be given treatment for his blood sugar.  Await the laboratory results to determine if he is in early DKA or DKA.  Final Clinical Impressions(s) / ED Diagnoses   Final diagnoses:  None    ED Discharge Orders    None       Dalia Heading, PA-C 04/16/19 1502    Charlesetta Shanks, MD 04/26/19 1051

## 2019-04-16 NOTE — H&P (Signed)
History and Physical    Steve [name]. NWG:[id number] DOB: [birth year] DOA: 04/16/2019  PCP: Leonard Downing, MD   Patient coming from: ALF  I have personally briefly reviewed patient's old medical records in [place]  Chief Complaint: Headache  HPI: Steve [name]. is a 78 y.o. male with medical history significant of coronary disease status post CABG, proximal A. fib on Eliquis, hypertension, hyperlipidemia, diabetes mellitus type 2 currently not on any oral medications, ischemic cardiomyopathy presented from his assisted living facility with complaints of seeing stars and a slight headache earlier today.  Patient states that his blood pressure was checked at the facility and it was normal.  When EMS arrived, his blood sugar was extremely elevated.  He has been off his metformin for more than a year.  Patient denies any chest pain, fever, cough, weakness, numbness, dizziness, shortness of breath, blurred vision, loss of consciousness or seizures.  ED Course: His blood sugar was elevated at 651, anion gap is 11.  He was started on insulin drip.  Hospitalist service was called to evaluate the patient.  Review of Systems: As per HPI otherwise 10 point systems were reviewed and were negative.   Past Medical History:  Diagnosis Date  . CAD (coronary artery disease)    a. s/p CABG x 29 Nov 2011 with LIMA to LAD, SVG to OM1 and distal LCX, SVG to ramus intermediate  . Essential hypertension   . Hyperlipidemia   . Intermittent claudication ([place])   . Ischemic cardiomyopathy    a. 03/2015 Echo: EF45-50%, Gr1 DD, mild MR.  Marland Kitchen PAF (paroxysmal atrial fibrillation) ([place])    a. post op atrial fib 11/2011; short course of amiodarone; stopped 01/25/12  . Peripheral arterial disease ([place])   . Right bundle branch block   . Type II diabetes mellitus ([place])     Past Surgical History:  Procedure Laterality Date  . CARDIAC CATHETERIZATION  01/16/1999   Est. EF of 65% -- Nonobstruction  atherosclerotic coronary artery disease -- Normal left ventricular function      . CARDIAC ELECTROPHYSIOLOGY STUDY AND ABLATION  05/21/1999   Normal sinus funtion -- Mildly prolonged interatrial conduction times -- Normal A-V node funtion -- Normal His Purkinje system function -- fNo accessory pathway -- No inducible ventricular tachycardia in the presence of the or in the absence of isoproterenol with programmed stimulation or with burst pacing -- Nikki Dom, M.D. FACC surgan          . CHOLECYSTECTOMY  1999  . CORONARY ARTERY BYPASS GRAFT  12/10/2011   Procedure: CORONARY ARTERY BYPASS GRAFTING (CABG);  Surgeon: Tharon Aquas Adelene Idler, MD;  Location: [facility];  Service: Open Heart Surgery;  Laterality: N/A;  Coronary Artery bypass graft on pump times four utilizing left internal mammary artery and left saphenous vein harvested endoscopically  . LEFT HEART CATH AND CORS/GRAFTS ANGIOGRAPHY N/A 12/18/2018   Procedure: LEFT HEART CATH AND CORS/GRAFTS ANGIOGRAPHY;  Surgeon: Belva Crome, MD;  Location: [facility];  Service: Cardiovascular;  Laterality: N/A;  . LEFT HEART CATHETERIZATION WITH CORONARY ANGIOGRAM N/A 12/09/2011   Procedure: LEFT HEART CATHETERIZATION WITH CORONARY ANGIOGRAM;  Surgeon: Peter M Martinique, MD;  Location[facility] CATH LAB;  Service: Cardiovascular;  Laterality: N/A;  . LOWER EXTREMITY ANGIOGRAM Left 07/28/2018   Procedure: Lower Extremity Angiogram;  Surgeon: Angelia Mould, MD;  Location: [facility];  Service: Cardiovascular;  Laterality: Left;   Social history  reports  that he quit smoking about 39 years ago. His smoking use included cigarettes. He has a 30.00 pack-year smoking history. He has quit using smokeless tobacco. He reports current alcohol use of about 4.0 standard drinks of alcohol per week. He reports that he does not use drugs.  Allergies  Allergen Reactions  . Statins Other (See Comments)    Myalgias, muscle weakness    Family History   Problem Relation Age of Onset  . Stroke Mother 88       Cerebellar hemorrhage    Prior to Admission medications   Medication Sig Start Date End Date Taking? Authorizing Provider  amLODipine (NORVASC) 10 MG tablet Take 1 tablet by mouth once daily Patient taking differently: Take 10 mg by mouth daily with supper.  04/09/19  Yes Cheryln Manly, NP  apixaban (ELIQUIS) 5 MG TABS tablet Take 1 tablet (5 mg total) by mouth 2 (two) times daily. Patient taking differently: Take 5 mg by mouth 2 (two) times daily with a meal.  07/29/18  Yes Helberg, Larkin Ina, MD  docusate sodium (COLACE) 100 MG capsule Take 100 mg by mouth daily with breakfast.    Yes [provider]  ezetimibe (ZETIA) 10 MG tablet Take 1 tablet by mouth once daily Patient taking differently: Take 10 mg by mouth daily with supper.  02/08/19  Yes Martinique, Peter M, MD  isosorbide mononitrate (IMDUR) 30 MG 24 hr tablet Take 1 tablet by mouth once daily Patient taking differently: Take 30 mg by mouth daily with breakfast.  02/08/19  Yes Martinique, Peter M, MD  metoprolol succinate (TOPROL-XL) 100 MG 24 hr tablet Take 1 tablet (100 mg total) by mouth daily. Take with or immediately following a meal. Patient taking differently: Take 100 mg by mouth daily with supper. Take with or immediately following a meal. 12/19/18  Yes Reino Bellis B, NP  tamsulosin (FLOMAX) 0.4 MG CAPS capsule Take 0.4 mg by mouth daily after supper.  11/01/18  Yes [provider]  vitamin B-12 (CYANOCOBALAMIN) 500 MCG tablet Take 500 mcg by mouth daily with breakfast.    Yes [provider]  furosemide (LASIX) 40 MG tablet Take 1 tablet (40 mg total) by mouth daily. For 4 days then stop. 12/15/11 01/05/12  Nani Skillern, PA-C    Physical Exam: Vitals:   04/16/19 1403 04/16/19 1404 04/16/19 1445  BP: (!) 155/73  139/73  Pulse: 77  71  Resp: 17  16  Temp: 98.2 F (36.8 C)    TempSrc: Oral    SpO2: 98%  97%  Weight:  87.1 kg    Height:  5\' 9"  (1.753 m)     Constitutional: NAD, calm, comfortable Vitals:   04/16/19 1403 04/16/19 1404 04/16/19 1445  BP: (!) 155/73  139/73  Pulse: 77  71  Resp: 17  16  Temp: 98.2 F (36.8 C)    TempSrc: Oral    SpO2: 98%  97%  Weight:  87.1 kg   Height:  5\' 9"  (1.753 m)    Eyes: PERRL, lids and conjunctivae normal ENMT: Mucous membranes are dry.  Posterior pharynx clear of any exudate or lesions. Neck: normal, supple, no masses, no thyromegaly Respiratory: bilateral decreased breath sounds at bases, no wheezing, no crackles. Normal respiratory effort. No accessory muscle use.  Cardiovascular: S1 S2 positive, rate controlled. No extremity edema. 2+ pedal pulses.  Abdomen: no tenderness, no masses palpated. No hepatosplenomegaly. Bowel sounds positive.  Musculoskeletal: no clubbing / cyanosis. No joint deformity upper  and lower extremities.  Skin: no rashes, lesions, ulcers. No induration Neurologic: CN 2-12 grossly intact. Moving extremities. No focal neurologic deficits.  Psychiatric: Normal judgment and insight. Alert and oriented x 3. Normal mood.   Labs on Admission: I have personally reviewed following labs and imaging studies  CBC: Recent Labs  Lab 04/16/19 1452 04/16/19 1459  WBC 6.4  --   HGB 10.2* 9.5*  HCT 27.5* 28.0*  MCV 88.7  --   PLT 143*  --    Basic Metabolic Panel: Recent Labs  Lab 04/16/19 1452 04/16/19 1459  NA 123* 125*  K 3.0* 3.1*  CL 94*  --   CO2 18*  --   GLUCOSE 651*  --   BUN 11  --   CREATININE 1.50*  --   CALCIUM 8.3*  --    GFR: Estimated Creatinine Clearance: 42.9 mL/min (A) (by C-G formula based on SCr of 1.5 mg/dL (H)). Liver Function Tests: No results for input(s): AST, ALT, ALKPHOS, BILITOT, PROT, ALBUMIN in the last 168 hours. No results for input(s): LIPASE, AMYLASE in the last 168 hours. No results for input(s): AMMONIA in the last 168 hours. Coagulation Profile: No results for input(s): INR, PROTIME in the last  168 hours. Cardiac Enzymes: No results for input(s): CKTOTAL, CKMB, CKMBINDEX, TROPONINI in the last 168 hours. BNP (last 3 results) No results for input(s): PROBNP in the last 8760 hours. HbA1C: No results for input(s): HGBA1C in the last 72 hours. CBG: Recent Labs  Lab 04/16/19 1407  GLUCAP >600*   Lipid Profile: No results for input(s): CHOL, HDL, LDLCALC, TRIG, CHOLHDL, LDLDIRECT in the last 72 hours. Thyroid Function Tests: No results for input(s): TSH, T4TOTAL, FREET4, T3FREE, THYROIDAB in the last 72 hours. Anemia Panel: No results for input(s): VITAMINB12, FOLATE, FERRITIN, TIBC, IRON, RETICCTPCT in the last 72 hours. Urine analysis:    Component Value Date/Time   COLORURINE YELLOW 12/15/2018 0923   APPEARANCEUR CLEAR 12/15/2018 0923   LABSPEC 1.006 12/15/2018 0923   PHURINE 7.0 12/15/2018 0923   GLUCOSEU NEGATIVE 12/15/2018 0923   HGBUR NEGATIVE 12/15/2018 0923   BILIRUBINUR NEGATIVE 12/15/2018 0923   KETONESUR NEGATIVE 12/15/2018 0923   PROTEINUR 30 (A) 12/15/2018 0923   UROBILINOGEN 1.0 04/01/2015 0900   NITRITE POSITIVE (A) 12/15/2018 0923   LEUKOCYTESUR NEGATIVE 12/15/2018 0923    Radiological Exams on Admission: Dg Chest 2 View  Result Date: 04/16/2019 CLINICAL DATA:  Patient stood up today and saw "stars" He reports intermittent headaches recently. His blood sugar read high on ems equipment. Per pt no chest pain nor other symptom now. Hx of diabetes, HTN, CAD, PAF, Right BBB, ischemic cardiomyopathy. Elevated Glucose EXAM: CHEST - 2 VIEW COMPARISON:  None. FINDINGS: Sternotomy wires overlie normal cardiac silhouette. Normal pulmonary vasculature. No effusion, infiltrate, or pneumothorax. No acute osseous abnormality. Of 5 IMPRESSION: No acute cardiopulmonary process. Electronically Signed   By: Suzy Bouchard M.D.   On: 04/16/2019 16:49     Assessment/Plan Principal Problem:   DKA (diabetic ketoacidoses) ([place]) Active Problems:   Hypertension    Hyperlipidemia   Diabetes mellitus, type 2 ([place])   Hx of CABG   Ischemic cardiomyopathy   CAD (coronary artery disease)   PAF (paroxysmal atrial fibrillation) ([facility])  Hyperglycemic hyperosmolar state Diabetes type 2, currently not on any oral medications -Presented with blood sugar of 651 with anion gap 11.  Patient has been started on insulin drip.  Continue insulin drip along with IV fluids.  Check BMP every  4 hours.  N.p.o. for now.  Once blood sugars start improving, then will transition to long-acting insulin.  Hemoglobin A1c in a.m.  Diabetes coordinator consult.  Hyponatremia -Probably pseudohyponatremia from hyperglycemia.  Might be also secondary to dehydration -IV fluids as above.  Monitor.  Hypokalemia -Replace.  Repeat labs  History of coronary artery disease status post CABG Chronic ischemic cardiomyopathy Paroxysmal A. Fib Hypertension -Last echo in February 2020 showed EF of 35 to 40%. -Cardiology in February 2020 had recommended medical management. -Currently compensated.  Continue metoprolol, isosorbide mononitrate, Eliquis and amlodipine  Chronic renal disease stage III  -Creatinine stable.  Monitor  Hyperlipidemia -Continue ezetimibe  DVT prophylaxis: Eliquis Code Status: DNR Family Communication: Spoke to [employer] on phone on 04/16/2019 disposition Plan: Back to ALF once condition improves in 1 to 2 days Consults called: None Admission status: Observation/progressive care unit  Severity of Illness: The appropriate patient status for this patient is OBSERVATION. Observation status is judged to be reasonable and necessary in order to provide the required intensity of service to ensure the patient's safety. The patient's presenting symptoms, physical exam findings, and initial radiographic and laboratory data in the context of their medical condition is felt to place them at decreased risk for further clinical deterioration. Furthermore, it is anticipated  that the patient will be medically stable for discharge from the hospital within 2 midnights of admission. The following factors support the patient status of observation.   " The patient's presenting symptoms include headache. " The physical exam findings include dehydration. " The initial radiographic and laboratory data are extreme hyperglycemia.      Aline August MD [facility]  04/16/2019, 4:53 PM

## 2019-04-16 NOTE — ED Triage Notes (Signed)
Patient stood up today and saw "stars"  He reports intermittent headaches recently.  His blood sugar read high on ems equipment .   No pain at this time.

## 2019-04-16 NOTE — ED Notes (Signed)
Glucose of 651 called to this RN, primary RN and provider notified.

## 2019-04-17 DIAGNOSIS — I480 Paroxysmal atrial fibrillation: Secondary | ICD-10-CM | POA: Diagnosis present

## 2019-04-17 DIAGNOSIS — R739 Hyperglycemia, unspecified: Secondary | ICD-10-CM | POA: Diagnosis present

## 2019-04-17 DIAGNOSIS — E785 Hyperlipidemia, unspecified: Secondary | ICD-10-CM | POA: Diagnosis present

## 2019-04-17 DIAGNOSIS — I2510 Atherosclerotic heart disease of native coronary artery without angina pectoris: Secondary | ICD-10-CM | POA: Diagnosis present

## 2019-04-17 DIAGNOSIS — E1122 Type 2 diabetes mellitus with diabetic chronic kidney disease: Secondary | ICD-10-CM | POA: Diagnosis present

## 2019-04-17 DIAGNOSIS — E860 Dehydration: Secondary | ICD-10-CM | POA: Diagnosis present

## 2019-04-17 DIAGNOSIS — Z66 Do not resuscitate: Secondary | ICD-10-CM | POA: Diagnosis present

## 2019-04-17 DIAGNOSIS — N183 Chronic kidney disease, stage 3 (moderate): Secondary | ICD-10-CM | POA: Diagnosis present

## 2019-04-17 DIAGNOSIS — I10 Essential (primary) hypertension: Secondary | ICD-10-CM | POA: Diagnosis not present

## 2019-04-17 DIAGNOSIS — I25118 Atherosclerotic heart disease of native coronary artery with other forms of angina pectoris: Secondary | ICD-10-CM

## 2019-04-17 DIAGNOSIS — E1110 Type 2 diabetes mellitus with ketoacidosis without coma: Secondary | ICD-10-CM | POA: Diagnosis present

## 2019-04-17 DIAGNOSIS — Z9049 Acquired absence of other specified parts of digestive tract: Secondary | ICD-10-CM | POA: Diagnosis not present

## 2019-04-17 DIAGNOSIS — E1142 Type 2 diabetes mellitus with diabetic polyneuropathy: Secondary | ICD-10-CM | POA: Diagnosis not present

## 2019-04-17 DIAGNOSIS — E1100 Type 2 diabetes mellitus with hyperosmolarity without nonketotic hyperglycemic-hyperosmolar coma (NKHHC): Secondary | ICD-10-CM | POA: Diagnosis present

## 2019-04-17 DIAGNOSIS — Z87891 Personal history of nicotine dependence: Secondary | ICD-10-CM | POA: Diagnosis not present

## 2019-04-17 DIAGNOSIS — I255 Ischemic cardiomyopathy: Secondary | ICD-10-CM | POA: Diagnosis present

## 2019-04-17 DIAGNOSIS — E1151 Type 2 diabetes mellitus with diabetic peripheral angiopathy without gangrene: Secondary | ICD-10-CM | POA: Diagnosis present

## 2019-04-17 DIAGNOSIS — E876 Hypokalemia: Secondary | ICD-10-CM | POA: Diagnosis present

## 2019-04-17 DIAGNOSIS — Z951 Presence of aortocoronary bypass graft: Secondary | ICD-10-CM | POA: Diagnosis not present

## 2019-04-17 DIAGNOSIS — Z7901 Long term (current) use of anticoagulants: Secondary | ICD-10-CM | POA: Diagnosis not present

## 2019-04-17 DIAGNOSIS — I129 Hypertensive chronic kidney disease with stage 1 through stage 4 chronic kidney disease, or unspecified chronic kidney disease: Secondary | ICD-10-CM | POA: Diagnosis present

## 2019-04-17 DIAGNOSIS — Z1159 Encounter for screening for other viral diseases: Secondary | ICD-10-CM | POA: Diagnosis not present

## 2019-04-17 DIAGNOSIS — Z823 Family history of stroke: Secondary | ICD-10-CM | POA: Diagnosis not present

## 2019-04-17 LAB — CBC
HCT: 26.8 % — ABNORMAL LOW (ref 39.0–52.0)
Hemoglobin: 9.7 g/dL — ABNORMAL LOW (ref 13.0–17.0)
MCH: 32.7 pg (ref 26.0–34.0)
MCHC: 36.2 g/dL — ABNORMAL HIGH (ref 30.0–36.0)
MCV: 90.2 fL (ref 80.0–100.0)
Platelets: 135 10*3/uL — ABNORMAL LOW (ref 150–400)
RBC: 2.97 MIL/uL — ABNORMAL LOW (ref 4.22–5.81)
RDW: 12.4 % (ref 11.5–15.5)
WBC: 6.3 10*3/uL (ref 4.0–10.5)
nRBC: 0 % (ref 0.0–0.2)

## 2019-04-17 LAB — BASIC METABOLIC PANEL
Anion gap: 7 (ref 5–15)
Anion gap: 7 (ref 5–15)
BUN: 8 mg/dL (ref 8–23)
BUN: 6 mg/dL — ABNORMAL LOW (ref 8–23)
CO2: 19 mmol/L — ABNORMAL LOW (ref 22–32)
CO2: 20 mmol/L — ABNORMAL LOW (ref 22–32)
Calcium: 8.1 mg/dL — ABNORMAL LOW (ref 8.9–10.3)
Calcium: 8.1 mg/dL — ABNORMAL LOW (ref 8.9–10.3)
Chloride: 108 mmol/L (ref 98–111)
Chloride: 108 mmol/L (ref 98–111)
Creatinine, Ser: 1.15 mg/dL (ref 0.61–1.24)
Creatinine, Ser: 1.25 mg/dL — ABNORMAL HIGH (ref 0.61–1.24)
GFR calc Af Amer: >60 mL/min (ref >60)
GFR calc Af Amer: >60 mL/min (ref >60)
GFR calc non Af Amer: 60 mL/min — ABNORMAL LOW (ref >60)
GFR calc non Af Amer: 54 mL/min — ABNORMAL LOW (ref >60)
Glucose, Bld: 180 mg/dL — ABNORMAL HIGH (ref 70–99)
Glucose, Bld: 138 mg/dL — ABNORMAL HIGH (ref 70–99)
Potassium: 2.9 mmol/L — ABNORMAL LOW (ref 3.5–5.1)
Potassium: 2.7 mmol/L — CL (ref 3.5–5.1)
Sodium: 134 mmol/L — ABNORMAL LOW (ref 135–145)
Sodium: 135 mmol/L (ref 135–145)

## 2019-04-17 LAB — GLUCOSE, CAPILLARY
Glucose-Capillary: 128 mg/dL — ABNORMAL HIGH (ref 70–99)
Glucose-Capillary: 160 mg/dL — ABNORMAL HIGH (ref 70–99)
Glucose-Capillary: 162 mg/dL — ABNORMAL HIGH (ref 70–99)
Glucose-Capillary: 164 mg/dL — ABNORMAL HIGH (ref 70–99)
Glucose-Capillary: 171 mg/dL — ABNORMAL HIGH (ref 70–99)
Glucose-Capillary: 172 mg/dL — ABNORMAL HIGH (ref 70–99)
Glucose-Capillary: 173 mg/dL — ABNORMAL HIGH (ref 70–99)
Glucose-Capillary: 177 mg/dL — ABNORMAL HIGH (ref 70–99)
Glucose-Capillary: 178 mg/dL — ABNORMAL HIGH (ref 70–99)
Glucose-Capillary: 179 mg/dL — ABNORMAL HIGH (ref 70–99)
Glucose-Capillary: 184 mg/dL — ABNORMAL HIGH (ref 70–99)
Glucose-Capillary: 197 mg/dL — ABNORMAL HIGH (ref 70–99)
Glucose-Capillary: 198 mg/dL — ABNORMAL HIGH (ref 70–99)

## 2019-04-17 LAB — MAGNESIUM: Magnesium: 1.6 mg/dL — ABNORMAL LOW (ref 1.7–2.4)

## 2019-04-17 LAB — HEMOGLOBIN A1C
Hgb A1c MFr Bld: 10.8 % — ABNORMAL HIGH (ref 4.8–5.6)
Mean Plasma Glucose: 263.26 mg/dL

## 2019-04-17 MED ORDER — LIVING WELL WITH DIABETES BOOK
Freq: Once | Status: AC
  Administered 2019-04-17: 17:00:00
  Filled 2019-04-17: qty 1

## 2019-04-17 MED ORDER — INSULIN DETEMIR 100 UNIT/ML ~~LOC~~ SOLN
18.0000 [IU] | SUBCUTANEOUS | Status: DC
  Administered 2019-04-17 – 2019-04-18 (×2): 18 [IU] via SUBCUTANEOUS
  Filled 2019-04-17 (×2): qty 0.18

## 2019-04-17 MED ORDER — POTASSIUM CHLORIDE CRYS ER 20 MEQ PO TBCR
30.0000 meq | EXTENDED_RELEASE_TABLET | ORAL | Status: AC
  Administered 2019-04-17 (×2): 30 meq via ORAL
  Filled 2019-04-17 (×2): qty 1

## 2019-04-17 MED ORDER — INSULIN ASPART 100 UNIT/ML ~~LOC~~ SOLN
0.0000 [IU] | Freq: Three times a day (TID) | SUBCUTANEOUS | Status: DC
  Administered 2019-04-17 (×2): 3 [IU] via SUBCUTANEOUS
  Administered 2019-04-18 (×2): 5 [IU] via SUBCUTANEOUS

## 2019-04-17 MED ORDER — INSULIN ASPART 100 UNIT/ML ~~LOC~~ SOLN: 0.0000 [IU] | Freq: Every day | SUBCUTANEOUS | Status: DC

## 2019-04-17 MED ORDER — INSULIN STARTER KIT- SYRINGES (ENGLISH)
1.0000 | Freq: Once | Status: AC
  Administered 2019-04-18: 1
  Filled 2019-04-17 (×2): qty 1

## 2019-04-17 MED ORDER — POTASSIUM CHLORIDE CRYS ER 20 MEQ PO TBCR
40.0000 meq | EXTENDED_RELEASE_TABLET | ORAL | Status: AC
  Administered 2019-04-17 (×2): 40 meq via ORAL
  Filled 2019-04-17 (×2): qty 2

## 2019-04-17 MED ORDER — MAGNESIUM SULFATE 2 GM/50ML IV SOLN
2.0000 g | Freq: Once | INTRAVENOUS | Status: AC
  Administered 2019-04-17: 2 g via INTRAVENOUS
  Filled 2019-04-17: qty 50

## 2019-04-17 NOTE — Evaluation (Signed)
Physical Therapy Evaluation Patient Details Name: Steve [name]. MRN: [id number] DOB: 02/27/1941 Today's Date: 04/17/2019   History of Present Illness  Patient presented to the hosptial with DKA after reporting seeing stars. His blood sugar is back down today and he reports he is feeling better but still a little weak. PMH: Right bundle brach block , DMII, peripheral neuropathy, CAD, Intermittent caludication   Clinical Impression  Patient has no need for skilled therapy at this time. His gait distance was somewhat limited by neuropathy and claudication but this is baseline. He had no syncope or loss of balance with mobility.     Follow Up Recommendations No PT follow up    Equipment Recommendations  None recommended by PT    Recommendations for Other Services       Precautions / Restrictions Precautions Precautions: Fall Restrictions Weight Bearing Restrictions: No      Mobility  Bed Mobility Overal bed mobility: Independent             General bed mobility comments: Patient sat up EOBN without assist   Transfers Overall transfer level: Independent Equipment used: Rolling walker (2 wheeled)             General transfer comment: stood without assist for balance or strength   Ambulation/Gait Ambulation/Gait assistance: Supervision Gait Distance (Feet): 100 Feet Assistive device: Rolling walker (2 wheeled) Gait Pattern/deviations: WFL(Within Functional Limits) Gait velocity: decreased   General Gait Details: Patient reported some pain in his feet after he had walked but he reports this is baseline   Science writer    Modified Rankin (Stroke Patients Only)       Balance Overall balance assessment: No apparent balance deficits (not formally assessed)                                           Pertinent Vitals/Pain Pain Assessment: (minor discofort in feet as he ambualted )    Home Living  Family/patient expects to be discharged to:: Other (Comment)(indepdnent living )                 Additional Comments: Patient was going to a dining hall for his meals but since quarentine has been having meals in his room     Prior Function Level of Independence: Independent         Comments: Intermittent use of RW secondary to L foot pain     Hand Dominance   Dominant Hand: Right    Extremity/Trunk Assessment   Upper Extremity Assessment Upper Extremity Assessment: Overall WFL for tasks assessed    Lower Extremity Assessment Lower Extremity Assessment: Overall WFL for tasks assessed       Communication   Communication: HOH  Cognition Arousal/Alertness: Awake/alert Behavior During Therapy: WFL for tasks assessed/performed Overall Cognitive Status: Within Functional Limits for tasks assessed                                        General Comments      Exercises     Assessment/Plan    PT Assessment Patent does not need any further PT services  PT Problem List         PT Treatment Interventions  PT Goals (Current goals can be found in the Care Plan section)  Acute Rehab PT Goals Patient Stated Goal: to go home  PT Goal Formulation: With patient Time For Goal Achievement: 04/24/19 Potential to Achieve Goals: Good    Frequency     Barriers to discharge        Co-evaluation               AM-PAC PT "6 Clicks" Mobility  Outcome Measure Help needed turning from your back to your side while in a flat bed without using bedrails?: None Help needed moving from lying on your back to sitting on the side of a flat bed without using bedrails?: None Help needed moving to and from a bed to a chair (including a wheelchair)?: None Help needed standing up from a chair using your arms (e.g., wheelchair or bedside chair)?: None Help needed to walk in hospital room?: None Help needed climbing 3-5 steps with a railing? : None 6 Click  Score: 24    End of Session Equipment Utilized During Treatment: Gait belt Activity Tolerance: Patient tolerated treatment well Patient left: in bed;with call bell/phone within reach;with bed alarm set(per patient request ) Nurse Communication: Mobility status PT Visit Diagnosis: Other abnormalities of gait and mobility (R26.89)    Time: 1030-1314 PT Time Calculation (min) (ACUTE ONLY): 20 min   Charges:   PT Evaluation $PT Eval Low Complexity: 1 Low            Carney Living PT DPT  04/17/2019, 1:46 PM

## 2019-04-17 NOTE — Progress Notes (Signed)
Patient educated on insulin injections. Pt performed one injection with guidance. States he feels comfortable with a little more practice and guidance. Spoke to Son and patient and dosing. Son expressed concern and encouraged easiest dosing method possible.

## 2019-04-17 NOTE — TOC Initial Note (Addendum)
Transition of Care (TOC) - Initial/Assessment Note    Patient Details  Name: Steve [name]. MRN: [id number] Date of Birth: 19-May-1941  [facility] CM/SW Contact:    Benard Halsted, LCSW Phone Number: 04/17/2019, 5:32 PM  Clinical Narrative:                 Patient resides at [facility] and will return at discharge. Facility will need FL2 and DC Summary faxed at discharge.   Expected Discharge Plan: Assisted Living Barriers to Discharge: Continued Medical Work up   Patient Goals and CMS Choice Patient states their goals for this hospitalization and ongoing recovery are:: Return to ALF CMS Medicare.gov Compare Post Acute Care list provided to:: (NA) Choice offered to / list presented to : NA  Expected Discharge Plan and Services Expected Discharge Plan: Assisted Living In-house Referral: Clinical Social Work Discharge Planning Services: NA Post Acute Care Choice: NA Living arrangements for the past 2 months: Assisted Living Facility                           HH Arranged: NA          Prior Living Arrangements/Services Living arrangements for the past 2 months: [place] Lives with:: Facility Resident Patient language and need for interpreter reviewed:: Yes Do you feel safe going back to the place where you live?: Yes      Need for Family Participation in Patient Care: No (Comment) Care giver support system in place?: Yes (comment)   Criminal Activity/Legal Involvement Pertinent to Current Situation/Hospitalization: No - Comment as needed  Activities of Daily Living Home Assistive Devices/Equipment: None ADL Screening (condition at time of admission) Patient's cognitive ability adequate to safely complete daily activities?: Yes Is the patient deaf or have difficulty hearing?: Yes Does the patient have difficulty seeing, even when wearing glasses/contacts?: No Does the patient have difficulty concentrating, remembering, or  making decisions?: No Patient able to express need for assistance with ADLs?: Yes Does the patient have difficulty dressing or bathing?: No Independently performs ADLs?: Yes (appropriate for developmental age) Does the patient have difficulty walking or climbing stairs?: No Weakness of Legs: None Weakness of Arms/Hands: None  Permission Sought/Granted Permission sought to share information with : Facility Art therapist granted to share information with : Yes, Verbal Permission Granted     Permission granted to share info w AGENCY: [facility]        Emotional Assessment Appearance:: Appears stated age Attitude/Demeanor/Rapport: Engaged Affect (typically observed): Accepting, Appropriate Orientation: : Oriented to Self, Oriented to Place, Oriented to  Time, Oriented to Situation Alcohol / Substance Use: Not Applicable Psych Involvement: No (comment)  Admission diagnosis:  Hyperglycemia [R73.9] Patient Active Problem List   Diagnosis Date Noted  . DKA (diabetic ketoacidoses) ([place]) 04/16/2019  . Atrial fibrillation ([place]) 12/15/2018  . CKD (chronic kidney disease), symptom management only, stage 4 (severe) ([place]) 09/08/2018  . Sepsis ([place]) 09/07/2018  . Diabetic ulcer of toe of left foot associated with type 2 diabetes mellitus ([place])   . Cellulitis of left anterior lower leg 07/25/2018  . Iron deficiency anemia 07/24/2018  . Acute kidney injury superimposed on chronic kidney disease ([place]) 07/24/2018  . Hyponatremia 07/24/2018  . Hypokalemia 07/24/2018  . Metabolic acidosis 97/35/3299  . Acute renal failure (ARF) ([place]) 07/24/2018  . Normocytic anemia 07/24/2018  . Thrombocytopenia ([place]) 07/24/2018  . Ischemic cardiomyopathy   . CAD (  coronary artery disease)   . PAF (paroxysmal atrial fibrillation) ([place])   . Essential hypertension   . Type II diabetes mellitus ([place])   . Near syncope 04/01/2015  . Dizziness and giddiness 04/01/2015  . Fatigue  04/01/2015  . Chronic renal disease, stage II 04/01/2015  . RBBB 04/01/2015  . Angina pectoris ([place]) 04/01/2015  . PAT (paroxysmal atrial tachycardia) ([place]) 12/16/2011  . 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  . Diabetes mellitus, type 2 ([place]) 05/31/2011  . Hypertension   . PVD (peripheral vascular disease) ([place])   . Hyperlipidemia    PCP:  Leonard Downing, MD Pharmacy:   [facility] [address] [place]), [place] - [address] [place] ([place]) [place] [address] Phone: [phone] Fax: [phone]  [facility] [place], [place] [address] [place] [address] Phone: [phone] Fax: [phone]     Social Determinants of Health (SDOH) Interventions    Readmission Risk Interventions No flowsheet data found.

## 2019-04-17 NOTE — Progress Notes (Addendum)
Inpatient Diabetes Program Recommendations  AACE/ADA: New Consensus Statement on Inpatient Glycemic Control  Target Ranges:  Prepandial:   less than 140 mg/dL      Peak postprandial:   less than 180 mg/dL (1-2 hours)      Critically ill patients:  140 - 180 mg/dL  Results for Steve [name], Steve [name] (MRN [id number]) as of 04/17/2019 08:13  Ref. Range 04/17/2019 00:24 04/17/2019 01:39 04/17/2019 02:38 04/17/2019 03:44 04/17/2019 04:43 04/17/2019 05:52 04/17/2019 06:53 04/17/2019 08:05  Glucose-Capillary Latest Ref Range: 70 - 99 mg/dL 162 (H) 164 (H) 178 (H) 172 (H) 173 (H) 179 (H) 184 (H) 177 (H)   Results for Steve [name], Steve [name] (MRN [id number]) as of 04/17/2019 08:13  Ref. Range 04/16/2019 14:52  Glucose Latest Ref Range: 70 - 99 mg/dL [facility])  Results for Steve [name], Steve [name] (MRN [id number]) as of 04/17/2019 08:13  Ref. Range 04/17/2019 02:35  Hemoglobin A1C Latest Ref Range: 4.8 - 5.6 % 10.8 (H)   Review of Glycemic Control  Diabetes history: DM2 Outpatient Diabetes medications: None (was on Metformin in the past but off for over 1 year) Current orders for Inpatient glycemic control: IV insulin drip  Inpatient Diabetes Program Recommendations:   Insulin: At time of transition from IV to SQ insulin, please consider ordering Lantus 18 units Q24H (based on 87.4 kg x 0.2 units), CBGs Q4H, and Novolog 0-9 units Q4H.  HgbA1C: A1C 10.8% on 04/17/19 indicating an average glucose of 263 mg/dl over the past 2-3 months.  NOTE: Will plan to see patient today.  Addendum 04/17/19@14 :30-Spoke with patient about diabetes and home regimen for diabetes control. Patient reports being followed by PCP for diabetes management. Patient reports that he was taken off Metformin about 1 year ago and that he has not checked glucose at home since then. Patient reports that he lives at an independent living facility so he takes care of himself and is responsible for his own medications. Patient reports that he can call the [place] at the facility if he needs any help with anything.  Patient reports that his glucose or labs have not been done in several months due to COVID restrictions at his facility.  Discussed A1C results (10.8% on 04/17/19) and explained that current A1C indicates an average glucose of 263 mg/dl over the past 2-3 months. Discussed glucose and A1C goals. Discussed importance of checking CBGs and maintaining good CBG control to prevent long-term and short-term complications. Explained how hyperglycemia leads to damage within blood vessels which lead to the common complications seen with uncontrolled diabetes. Stressed to the patient the importance of improving glycemic control to prevent further complications from uncontrolled diabetes. Discussed impact of nutrition, exercise, stress, sickness, and medications on diabetes control. Discussed using insulin as an outpatient and patient reports that he is fine with taking insulin if needed for DM control.  Discussed Lantus and Novolog insulin and explained how they both work.  Explained that insulin is available with vial/syringe and insulin pens. Patient states that he would prefer to use an insulin vial and syringe. Informed patient that an insulin starter kit would be ordered which he can read over and refer back to at home if he needs to. Explained and demonstrated proper technique on how to draw up insulin with vial and syringe. Showed patient how to draw up air, inject air into vial, draw up insulin, and inject. Patient demonstrated competency with insulin administration by vial and syringe.informed patient that RNs will be asking  him to self inject while inpatient to ensure he is comfortable with self injecting insulin. Discussed hypoglycemia along with proper treatment.  Encouraged patient to check glucose 3-4 times per day (before meals and at bedtime) and to keep a log book of glucose readings and DM medication taken which patient will need to take to doctor  appointments. Explained how the doctor can use the log book to continue to make adjustments with DM medications if needed.  Patient verbalized understanding of information discussed and reports no further questions at this time related to diabetes. RN to continue working with patient on insulin injections before discharge and allow patient to administer own insulin injections while inpatient.   Thanks, Barnie Alderman, RN, MSN, CDE Diabetes Coordinator Inpatient Diabetes Program [phone] (Team Pager from 8am to 5pm)

## 2019-04-17 NOTE — Progress Notes (Signed)
Spoke to Pt's son. Updated him on patient condition and plan of care. Answered questions to satisfaction. Will continue to update as needed.

## 2019-04-17 NOTE — Plan of Care (Signed)

## 2019-04-17 NOTE — Progress Notes (Signed)
Patient ID: Steve [name]., male   DOB: 1941/07/04, 78 y.o.   MRN: [id number]  PROGRESS NOTE    Steve [name].  TGG:[id number] DOB: 1940-11-06 DOA: 04/16/2019 PCP: Leonard Downing, MD   Brief Narrative:  78 y.o. male with medical history significant of coronary disease status post CABG, proximal A. fib on Eliquis, hypertension, hyperlipidemia, diabetes mellitus type 2 currently not on any oral medications, ischemic cardiomyopathy presented from his assisted living facility with complaints of seeing stars and a slight headache on 04/16/2019. His blood sugar was elevated at 651, anion gap is 11.  He was started on insulin drip.   Assessment & Plan:   Principal Problem:   DKA (diabetic ketoacidoses) ([place]) Active Problems:   Hypertension   Hyperlipidemia   Diabetes mellitus, type 2 ([place])   Hx of CABG   Ischemic cardiomyopathy   CAD (coronary artery disease)   PAF (paroxysmal atrial fibrillation) ([facility])  Hyperglycemic hyperosmolar state Diabetes type 2, uncontrolled, currently not on any oral medications -Presented with blood sugar of 651 with anion gap 11.  Patient was started on insulin and IV fluids.  Latest BMP showed anion gap of 7 and much improved blood sugars.   Will transition to long-acting insulin.  Continue CBGs with SSI.  Hemoglobin A1c 10.8.  Diabetes coordinator consult. -Patient might need long-acting insulin upon discharge as well.  Hyponatremia -Probably pseudohyponatremia from hyperglycemia.  Might be also secondary to dehydration -Much improved.  Monitor  Hypokalemia -Replace.  Repeat labs -We will check magnesium level as well.  History of coronary artery disease status post CABG Chronic ischemic cardiomyopathy Paroxysmal A. Fib Hypertension -Last echo in February 2020 showed EF of 35 to 40%. -Cardiology in February 2020 had recommended medical management. -Currently compensated.  Continue metoprolol, isosorbide mononitrate, Eliquis and amlodipine.  No  signs of volume overload.  Outpatient follow-up with cardiology.  Chronic renal disease stage III  -Creatinine stable.  Monitor  Hyperlipidemia -Continue ezetimibe  DVT prophylaxis: Eliquis Code Status: DNR Family Communication: Spoke to [employer] on phone on 04/16/2019 disposition Plan: Back to ALF tomorrow if patient remains stable with controlled blood sugars. Consultants: None  Procedures: None  Antimicrobials: None   Subjective: Patient seen and examined at bedside.  He denies any current headache.  Does not complain of seeing stars anymore.  No overnight fever or vomiting.  Objective: Vitals:   04/16/19 2100 04/16/19 2200 04/16/19 2318 04/17/19 0440  BP: (!) 141/66 (!) 147/68 (!) 144/64   Pulse: 70 64 73   Resp: 15 13    Temp:   98.7 F (37.1 C) 97.7 F (36.5 C)  TempSrc:   Oral Oral  SpO2: 95% 96% 100%   Weight:    87.4 kg  Height:        Intake/Output Summary (Last 24 hours) at 04/17/2019 0848 Last data filed at 04/17/2019 0440 Gross per 24 hour  Intake 418.32 ml  Output 1375 ml  Net -956.68 ml   Filed Weights   04/16/19 1404 04/17/19 0440  Weight: 87.1 kg 87.4 kg    Examination:  General exam: Appears calm and comfortable.  No distress Respiratory system: Bilateral decreased breath sounds at bases.  No wheezing Cardiovascular system: S1 & S2 heard, Rate controlled Gastrointestinal system: Abdomen is nondistended, soft and nontender. Normal bowel sounds heard. Extremities: No cyanosis, clubbing, edema  Central nervous system: Alert and oriented. No focal neurological deficits. Moving extremities Skin: No rashes, lesions or ulcers Psychiatry: Judgement and insight  appear normal. Mood & affect appropriate.     Data Reviewed: I have personally reviewed following labs and imaging studies  CBC: Recent Labs  Lab 04/16/19 1452 04/16/19 1459  WBC 6.4  --   HGB 10.2* 9.5*  HCT 27.5* 28.0*  MCV 88.7  --   PLT 143*  --    Basic Metabolic  Panel: Recent Labs  Lab 04/16/19 1452 04/16/19 1459 04/16/19 1728 04/16/19 2216 04/17/19 0235  NA 123* 125* 126* 131* 134*  K 3.0* 3.1* 2.8* 3.3* 2.9*  CL 94*  --  99 105 108  CO2 18*  --  17* 18* 19*  GLUCOSE 651*  --  499* 288* 180*  BUN 11  --  10 9 8   CREATININE 1.50*  --  1.31* 1.21 1.15  CALCIUM 8.3*  --  8.0* 7.9* 8.1*   GFR: Estimated Creatinine Clearance: 56.1 mL/min (by C-G formula based on SCr of 1.15 mg/dL). Liver Function Tests: No results for input(s): AST, ALT, ALKPHOS, BILITOT, PROT, ALBUMIN in the last 168 hours. No results for input(s): LIPASE, AMYLASE in the last 168 hours. No results for input(s): AMMONIA in the last 168 hours. Coagulation Profile: No results for input(s): INR, PROTIME in the last 168 hours. Cardiac Enzymes: No results for input(s): CKTOTAL, CKMB, CKMBINDEX, TROPONINI in the last 168 hours. BNP (last 3 results) No results for input(s): PROBNP in the last 8760 hours. HbA1C: Recent Labs    04/17/19 0235  HGBA1C 10.8*   CBG: Recent Labs  Lab 04/17/19 0344 04/17/19 0443 04/17/19 0552 04/17/19 0653 04/17/19 0805  GLUCAP 172* 173* 179* 184* 177*   Lipid Profile: No results for input(s): CHOL, HDL, LDLCALC, TRIG, CHOLHDL, LDLDIRECT in the last 72 hours. Thyroid Function Tests: No results for input(s): TSH, T4TOTAL, FREET4, T3FREE, THYROIDAB in the last 72 hours. Anemia Panel: No results for input(s): VITAMINB12, FOLATE, FERRITIN, TIBC, IRON, RETICCTPCT in the last 72 hours. Sepsis Labs: No results for input(s): PROCALCITON, LATICACIDVEN in the last 168 hours.  Recent Results (from the past 240 hour(s))  SARS Coronavirus 2 (CEPHEID - Performed in [facility] lab), Hosp Order     Status: None   Collection Time: 04/16/19  5:38 PM   Specimen: Nasopharyngeal Swab  Result Value Ref Range Status   SARS Coronavirus 2 NEGATIVE NEGATIVE Final    Comment: (NOTE) If result is NEGATIVE SARS-CoV-2 target nucleic acids are NOT  DETECTED. The SARS-CoV-2 RNA is generally detectable in upper and lower  respiratory specimens during the acute phase of infection. The lowest  concentration of SARS-CoV-2 viral copies this assay can detect is 250  copies / mL. A negative result does not preclude SARS-CoV-2 infection  and should not be used as the sole basis for treatment or other  patient management decisions.  A negative result may occur with  improper specimen collection / handling, submission of specimen other  than nasopharyngeal swab, presence of viral mutation(s) within the  areas targeted by this assay, and inadequate number of viral copies  (<250 copies / mL). A negative result must be combined with clinical  observations, patient history, and epidemiological information. If result is POSITIVE SARS-CoV-2 target nucleic acids are DETECTED. The SARS-CoV-2 RNA is generally detectable in upper and lower  respiratory specimens dur ing the acute phase of infection.  Positive  results are indicative of active infection with SARS-CoV-2.  Clinical  correlation with patient history and other diagnostic information is  necessary to determine patient infection status.  Positive  results do  not rule out bacterial infection or co-infection with other viruses. If result is PRESUMPTIVE POSTIVE SARS-CoV-2 nucleic acids MAY BE PRESENT.   A presumptive positive result was obtained on the submitted specimen  and confirmed on repeat testing.  While 2019 novel coronavirus  (SARS-CoV-2) nucleic acids may be present in the submitted sample  additional confirmatory testing may be necessary for epidemiological  and / or clinical management purposes  to differentiate between  SARS-CoV-2 and other Sarbecovirus currently known to infect humans.  If clinically indicated additional testing with an alternate test  methodology [phone]) is advised. The SARS-CoV-2 RNA is generally  detectable in upper and lower respiratory sp ecimens during  the acute  phase of infection. The expected result is Negative. Fact Sheet for Patients:  StrictlyIdeas.no Fact Sheet for Healthcare Providers: BankingDealers.co.za This test is not yet approved or cleared by the [place] FDA and has been authorized for detection and/or diagnosis of SARS-CoV-2 by FDA under an Emergency Use Authorization (EUA).  This EUA will remain in effect (meaning this test can be used) for the duration of the COVID-19 declaration under Section 564(b)(1) of the Act, 21 U.S.C. section 360bbb-3(b)(1), unless the authorization is terminated or revoked sooner. Performed at [facility], [place] [address] [place], [place] [address]          Radiology Studies: Dg Chest 2 View  Result Date: 04/16/2019 CLINICAL DATA:  Patient stood up today and saw "stars" He reports intermittent headaches recently. His blood sugar read high on ems equipment. Per pt no chest pain nor other symptom now. Hx of diabetes, HTN, CAD, PAF, Right BBB, ischemic cardiomyopathy. Elevated Glucose EXAM: CHEST - 2 VIEW COMPARISON:  None. FINDINGS: Sternotomy wires overlie normal cardiac silhouette. Normal pulmonary vasculature. No effusion, infiltrate, or pneumothorax. No acute osseous abnormality. Of 5 IMPRESSION: No acute cardiopulmonary process. Electronically Signed   By: Suzy Bouchard M.D.   On: 04/16/2019 16:49        Scheduled Meds: . amLODipine  10 mg Oral Q supper  . apixaban  5 mg Oral BID WC  . docusate sodium  100 mg Oral Q breakfast  . ezetimibe  10 mg Oral Q supper  . insulin aspart  0-15 Units Subcutaneous TID WC  . insulin aspart  0-5 Units Subcutaneous QHS  . insulin detemir  18 Units Subcutaneous Q24H  . isosorbide mononitrate  30 mg Oral Q breakfast  . metoprolol succinate  100 mg Oral Q supper  . tamsulosin  0.4 mg Oral QPC supper  . vitamin B-12  500 mcg Oral Q breakfast   Continuous Infusions: . sodium chloride  Stopped (04/16/19 1929)  . sodium chloride 125 mL/hr at 04/16/19 1826  . dextrose 5 % and 0.45% NaCl 100 mL/hr at 04/16/19 2304  . insulin 2.5 Units/hr (04/17/19 0655)     LOS: 0 days        Aline August, MD [facility] 04/17/2019, 8:48 AM

## 2019-04-18 DIAGNOSIS — E1142 Type 2 diabetes mellitus with diabetic polyneuropathy: Secondary | ICD-10-CM

## 2019-04-18 DIAGNOSIS — E782 Mixed hyperlipidemia: Secondary | ICD-10-CM

## 2019-04-18 LAB — BASIC METABOLIC PANEL
Anion gap: 8 (ref 5–15)
BUN: 5 mg/dL — ABNORMAL LOW (ref 8–23)
CO2: 18 mmol/L — ABNORMAL LOW (ref 22–32)
Calcium: 8.5 mg/dL — ABNORMAL LOW (ref 8.9–10.3)
Chloride: 109 mmol/L (ref 98–111)
Creatinine, Ser: 1.18 mg/dL (ref 0.61–1.24)
GFR calc Af Amer: >60 mL/min (ref >60)
GFR calc non Af Amer: 58 mL/min — ABNORMAL LOW (ref >60)
Glucose, Bld: 190 mg/dL — ABNORMAL HIGH (ref 70–99)
Potassium: 3.3 mmol/L — ABNORMAL LOW (ref 3.5–5.1)
Sodium: 135 mmol/L (ref 135–145)

## 2019-04-18 LAB — MAGNESIUM: Magnesium: 1.8 mg/dL (ref 1.7–2.4)

## 2019-04-18 LAB — GLUCOSE, CAPILLARY
Glucose-Capillary: 174 mg/dL — ABNORMAL HIGH (ref 70–99)
Glucose-Capillary: 216 mg/dL — ABNORMAL HIGH (ref 70–99)
Glucose-Capillary: 232 mg/dL — ABNORMAL HIGH (ref 70–99)

## 2019-04-18 MED ORDER — POTASSIUM CHLORIDE CRYS ER 20 MEQ PO TBCR
40.0000 meq | EXTENDED_RELEASE_TABLET | Freq: Once | ORAL | Status: AC
  Administered 2019-04-18: 08:00:00 40 meq via ORAL
  Filled 2019-04-18: qty 2

## 2019-04-18 MED ORDER — BLOOD GLUCOSE METER KIT: PACK | 1 refills | Status: DC

## 2019-04-18 MED ORDER — "INSULIN SYRINGE 31G X 5/16"" 0.3 ML MISC": 2 refills | Status: DC

## 2019-04-18 MED ORDER — INSULIN DETEMIR 100 UNIT/ML ~~LOC~~ SOLN: 18.0000 [IU] | SUBCUTANEOUS | 2 refills | Status: DC

## 2019-04-18 MED ORDER — INSULIN ASPART 100 UNIT/ML ~~LOC~~ SOLN: 0.0000 [IU] | Freq: Three times a day (TID) | SUBCUTANEOUS | 2 refills | Status: DC

## 2019-04-18 NOTE — Plan of Care (Signed)
°  Problem: Education: °Goal: Knowledge of General Education information will improve °Description: Including pain rating scale, medication(s)/side effects and non-pharmacologic comfort measures °Outcome: Adequate for Discharge °  °Problem: Clinical Measurements: °Goal: Ability to maintain clinical measurements within normal limits will improve °Outcome: Adequate for Discharge °Goal: Will remain free from infection °Outcome: Adequate for Discharge °Goal: Diagnostic test results will improve °Outcome: Adequate for Discharge °Goal: Respiratory complications will improve °Outcome: Adequate for Discharge °Goal: Cardiovascular complication will be avoided °Outcome: Adequate for Discharge °  °

## 2019-04-18 NOTE — Discharge Instructions (Signed)
Hyperglycemia Hyperglycemia occurs when the level of sugar (glucose) in the blood is too high. Glucose is a type of sugar that provides the body's main source of energy. Certain hormones (insulin and glucagon) control the level of glucose in the blood. Insulin lowers blood glucose, and glucagon increases blood glucose. Hyperglycemia can result from having too little insulin in the bloodstream, or from the body not responding normally to insulin. Hyperglycemia occurs most often in people who have diabetes (diabetes mellitus), but it can happen in people who do not have diabetes. It can develop quickly, and it can be life-threatening if it causes you to become severely dehydrated (diabetic ketoacidosis or hyperglycemic hyperosmolar state). Severe hyperglycemia is a medical emergency. What are the causes? If you have diabetes, hyperglycemia may be caused by:  Diabetes medicine.  Medicines that increase blood glucose or affect your diabetes control.  Not eating enough, or not eating often enough.  Changes in physical activity level.  Being sick or having an infection. If you have prediabetes or undiagnosed diabetes:  Hyperglycemia may be caused by those conditions. If you do not have diabetes, hyperglycemia may be caused by:  Certain medicines, including steroid medicines, beta-blockers, epinephrine, and thiazide diuretics.  Stress.  Serious illness.  Surgery.  Diseases of the pancreas.  Infection. What increases the risk? Hyperglycemia is more likely to develop in people who have risk factors for diabetes, such as:  Having a family member with diabetes.  Having a gene for type 1 diabetes that is passed from parent to child (inherited).  Living in an area with cold weather conditions.  Exposure to certain viruses.  Certain conditions in which the body's disease-fighting (immune) system attacks itself (autoimmune disorders).  Being overweight or obese.  Having an inactive  (sedentary) lifestyle.  Having been diagnosed with insulin resistance.  Having a history of prediabetes, gestational diabetes, or polycystic ovarian syndrome (PCOS).  Being of American-Indian, African-American, Hispanic/Latino, or Asian/Pacific Islander descent. What are the signs or symptoms? Hyperglycemia may not cause any symptoms. If you do have symptoms, they may include early warning signs, such as:  Increased thirst.  Hunger.  Feeling very tired.  Needing to urinate more often than usual.  Blurry vision. Other symptoms may develop if hyperglycemia gets worse, such as:  Dry mouth.  Loss of appetite.  Fruity-smelling breath.  Weakness.  Unexpected or rapid weight gain or weight loss.  Tingling or numbness in the hands or feet.  Headache.  Skin that does not quickly return to normal after being lightly pinched and released (poor skin turgor).  Abdominal pain.  Cuts or bruises that are slow to heal. How is this diagnosed? Hyperglycemia is diagnosed with a blood test to measure your blood glucose level. This blood test is usually done while you are having symptoms. Your health care provider may also do a physical exam and review your medical history. You may have more tests to determine the cause of your hyperglycemia, such as:  A fasting blood glucose (FBG) test. You will not be allowed to eat (you will fast) for at least 8 hours before a blood sample is taken.  An A1c (hemoglobin A1c) blood test. This provides information about blood glucose control over the previous 2-3 months.  An oral glucose tolerance test (OGTT). This measures your blood glucose at two times: ? After fasting. This is your baseline blood glucose level. ? Two hours after drinking a beverage that contains glucose. How is this treated? Treatment depends on the cause  of your hyperglycemia. Treatment may include:  Taking medicine to regulate your blood glucose levels. If you take insulin or  other diabetes medicines, your medicine or dosage may be adjusted.  Lifestyle changes, such as exercising more, eating healthier foods, or losing weight.  Treating an illness or infection, if this caused your hyperglycemia.  Checking your blood glucose more often.  Stopping or reducing steroid medicines, if these caused your hyperglycemia. If your hyperglycemia becomes severe and it results in hyperglycemic hyperosmolar state, you must be hospitalized and given IV fluids. Follow these instructions at home:  General instructions  Take over-the-counter and prescription medicines only as told by your health care provider.  Do not use any products that contain nicotine or tobacco, such as cigarettes and e-cigarettes. If you need help quitting, ask your health care provider.  Limit alcohol intake to no more than 1 drink per day for nonpregnant women and 2 drinks per day for men. One drink equals 12 oz of beer, 5 oz of wine, or 1 oz of hard liquor.  Learn to manage stress. If you need help with this, ask your health care provider.  Keep all follow-up visits as told by your health care provider. This is important. Eating and drinking   Maintain a healthy weight.  Exercise regularly, as directed by your health care provider.  Stay hydrated, especially when you exercise, get sick, or spend time in hot temperatures.  Eat healthy foods, such as: ? Lean proteins. ? Complex carbohydrates. ? Fresh fruits and vegetables. ? Low-fat dairy products. ? Healthy fats.  Drink enough fluid to keep your urine clear or pale yellow. If you have diabetes:  Make sure you know the symptoms of hyperglycemia.  Follow your diabetes management plan, as told by your health care provider. Make sure you: ? Take your insulin and medicines as directed. ? Follow your exercise plan. ? Follow your meal plan. Eat on time, and do not skip meals. ? Check your blood glucose as often as directed. Make sure to  check your blood glucose before and after exercise. If you exercise longer or in a different way than usual, check your blood glucose more often. ? Follow your sick day plan whenever you cannot eat or drink normally. Make this plan in advance with your health care provider.  Share your diabetes management plan with people in your workplace, school, and household.  Check your urine for ketones when you are ill and as told by your health care provider.  Carry a medical alert card or wear medical alert jewelry. Contact a health care provider if:  Your blood glucose is at or above 240 mg/dL (13.3 mmol/L) for 2 days in a row.  You have problems keeping your blood glucose in your target range.  You have frequent episodes of hyperglycemia. Get help right away if:  You have difficulty breathing.  You have a change in how you think, feel, or act (mental status).  You have nausea or vomiting that does not go away. These symptoms may represent a serious problem that is an emergency. Do not wait to see if the symptoms will go away. Get medical help right away. Call your local emergency services (911 in the [place] Do not drive yourself to the hospital. Summary  Hyperglycemia occurs when the level of sugar (glucose) in the blood is too high.  Hyperglycemia is diagnosed with a blood test to measure your blood glucose level. This blood test is usually done while you are  having symptoms. Your health care provider may also do a physical exam and review your medical history.  If you have diabetes, follow your diabetes management plan as told by your health care provider.  Contact your health care provider if you have problems keeping your blood glucose in your target range. This information is not intended to replace advice given to you by your health care provider. Make sure you discuss any questions you have with your health care provider. Document Released: 04/06/2001 Document Revised: 06/28/2016  Document Reviewed: 06/28/2016 Elsevier Interactive Patient Education  2019 [employer].   Blood Glucose Monitoring, Adult Monitoring your blood sugar (glucose) is an important part of managing your diabetes (diabetes mellitus). Blood glucose monitoring involves checking your blood glucose as often as directed and keeping a record (log) of your results over time. Checking your blood glucose regularly and keeping a blood glucose log can:  Help you and your health care provider adjust your diabetes management plan as needed, including your medicines or insulin.  Help you understand how food, exercise, illnesses, and medicines affect your blood glucose.  Let you know what your blood glucose is at any time. You can quickly find out if you have low blood glucose (hypoglycemia) or high blood glucose (hyperglycemia). Your health care provider will set individualized treatment goals for you. Your goals will be based on your age, other medical conditions you have, and how you respond to diabetes treatment. Generally, the goal of treatment is to maintain the following blood glucose levels:  Before meals (preprandial): 80-130 mg/dL (4.4-7.2 mmol/L).  After meals (postprandial): below 180 mg/dL (10 mmol/L).  A1c level: less than 7%. Supplies needed:  Blood glucose meter.  Test strips for your meter. Each meter has its own strips. You must use the strips that came with your meter.  A needle to prick your finger (lancet). Do not use a lancet more than one time.  A device that holds the lancet (lancing device).  A journal or log book to write down your results. How to check your blood glucose  1. Wash your hands with soap and water. 2. Prick the side of your finger (not the tip) with the lancet. Use a different finger each time. 3. Gently rub the finger until a small drop of blood appears. 4. Follow instructions that come with your meter for inserting the test strip, applying blood to the  strip, and using your blood glucose meter. 5. Write down your result and any notes. Some meters allow you to use areas of your body other than your finger (alternative sites) to test your blood. The most common alternative sites are:  Forearm.  Thigh.  Palm of the hand. If you think you may have hypoglycemia, or if you have a history of not knowing when your blood glucose is getting low (hypoglycemia unawareness), do not use alternative sites. Use your finger instead. Alternative sites may not be as accurate as the fingers, because blood flow is slower in these areas. This means that the result you get may be delayed, and it may be different from the result that you would get from your finger. Follow these instructions at home: Blood glucose log   Every time you check your blood glucose, write down your result. Also write down any notes about things that may be affecting your blood glucose, such as your diet and exercise for the day. This information can help you and your health care provider: ? Look for patterns in your  blood glucose over time. ? Adjust your diabetes management plan as needed.  Check if your meter allows you to download your records to a computer. Most glucose meters store a record of glucose readings in the meter. If you have type 1 diabetes:  Check your blood glucose 2 or more times a day.  Also check your blood glucose: ? Before every insulin injection. ? Before and after exercise. ? Before meals. ? 2 hours after a meal. ? Occasionally between 2:00 a.m. and 3:00 a.m., as directed. ? Before potentially dangerous tasks, like driving or using heavy machinery. ? At bedtime.  You may need to check your blood glucose more often, up to 6-10 times a day, if you: ? Use an insulin pump. ? Need multiple daily injections (MDI). ? Have diabetes that is not well-controlled. ? Are ill. ? Have a history of severe hypoglycemia. ? Have hypoglycemia unawareness. If you have  type 2 diabetes:  If you take insulin or other diabetes medicines, check your blood glucose 2 or more times a day.  If you are on intensive insulin therapy, check your blood glucose 4 or more times a day. Occasionally, you may also need to check between 2:00 a.m. and 3:00 a.m., as directed.  Also check your blood glucose: ? Before and after exercise. ? Before potentially dangerous tasks, like driving or using heavy machinery.  You may need to check your blood glucose more often if: ? Your medicine is being adjusted. ? Your diabetes is not well-controlled. ? You are ill. General tips  Always keep your supplies with you.  If you have questions or need help, all blood glucose meters have a 24-hour "hotline" phone number that you can call. You may also contact your health care provider.  After you use a few boxes of test strips, adjust (calibrate) your blood glucose meter by following instructions that came with your meter. Contact a health care provider if:  Your blood glucose is at or above 240 mg/dL (13.3 mmol/L) for 2 days in a row.  You have been sick or have had a fever for 2 days or longer, and you are not getting better.  You have any of the following problems for more than 6 hours: ? You cannot eat or drink. ? You have nausea or vomiting. ? You have diarrhea. Get help right away if:  Your blood glucose is lower than 54 mg/dL (3 mmol/L).  You become confused or you have trouble thinking clearly.  You have difficulty breathing.  You have moderate or large ketone levels in your urine. Summary  Monitoring your blood sugar (glucose) is an important part of managing your diabetes (diabetes mellitus).  Blood glucose monitoring involves checking your blood glucose as often as directed and keeping a record (log) of your results over time.  Your health care provider will set individualized treatment goals for you. Your goals will be based on your age, other medical conditions  you have, and how you respond to diabetes treatment.  Every time you check your blood glucose, write down your result. Also write down any notes about things that may be affecting your blood glucose, such as your diet and exercise for the day. This information is not intended to replace advice given to you by your health care provider. Make sure you discuss any questions you have with your health care provider. Document Released: 10/14/2003 Document Revised: 08/22/2017 Document Reviewed: 03/22/2016 Elsevier Interactive Patient Education  2019 [employer].

## 2019-04-18 NOTE — Discharge Summary (Signed)
Physician Discharge Summary  Steve [name]. YBO:[id number] DOB: [birth year] DOA: 04/16/2019  PCP: Leonard Downing, MD  Admit date: 04/16/2019 Discharge date: 04/18/2019  Time spent: 45 minutes  Recommendations for Outpatient Follow-up:  Patient will be discharged to assisted living facility.  Patient will need to follow up with primary care provider within one week of discharge.  Patient should continue medications as prescribed.  Patient should follow a heart healthy/carb modified diet.    Discharge Diagnoses:  Hyperglycemic hyperosmolar state Diabetes type 2, currently not on any oral medications Hyponatremia Hypokalemia History of coronary artery disease status post CABG Chronic ischemic cardiomyopathy Paroxysmal A. Fib Hypertension Chronic renal disease stage III  Hyperlipidemia  Discharge Condition: Stable  Diet recommendation: heart healthy/carb modified   Filed Weights   04/16/19 1404 04/17/19 0440 04/18/19 0445  Weight: 87.1 kg 87.4 kg 85.6 kg    History of present illness:  On 04/16/2019 by Dr. Rochele Raring. is a 78 y.o. male with medical history significant of coronary disease status post CABG, proximal A. fib on Eliquis, hypertension, hyperlipidemia, diabetes mellitus type 2 currently not on any oral medications, ischemic cardiomyopathy presented from his assisted living facility with complaints of seeing stars and a slight headache earlier today.  Patient states that his blood pressure was checked at the facility and it was normal.  When EMS arrived, his blood sugar was extremely elevated.  He has been off his metformin for more than a year.  Patient denies any chest pain, fever, cough, weakness, numbness, dizziness, shortness of breath, blurred vision, loss of consciousness or seizures.  Hospital Course:  Hyperglycemic hyperosmolar state/ Diabetes mellitus, type 2 -Metformin in the past however was discontinued by his PCP.  He was not placed on  any other medications. -On admission, blood sugar of 651, anion gap of 11 -Was started on insulin drip along with IV fluids -He was transitioned to Levemir as well as insulin sliding scale -Diabetes coordinator consulted -Hemoglobin A1c 10.8  Hyponatremia -Probably pseudohyponatremia from hyperglycemia.  Might be also secondary to  -Resolved, sodium currently 135  Hypokalemia -replaced, repeat BMP in 1 week  History of coronary artery disease status post CABG/ Chronic ischemic cardiomyopathy/ Paroxysmal A. Fib/Hypertension -Last echo in February 2020 showed EF of 35 to 40%. -Cardiology in February 2020 had recommended medical management. -No complaints of chest pain -Currently appears to be compensated and euvolemic -Continue Eliquis, metoprolol, Imdur, amlodipine  Chronic renal disease stage III  -Creatinine appears to be stable  Hyperlipidemia -Continue ezetimibe  COVID TESTING NEGATIVE  Code status: DNR  Procedures: None  Consultations: None  Discharge Exam: Vitals:   04/18/19 0627 04/18/19 0647  BP:  (!) 141/70  Pulse:  60  Resp:    Temp:  (!) 97.4 F (36.3 C)  SpO2: 97% 100%     General: Well developed, well nourished, NAD, appears stated age  HEENT: NCAT, mucous membranes moist.  Neck: Supple  Cardiovascular: S1 S2 auscultated, no murmur, RRR  Respiratory: Clear to auscultation bilaterall  Abdomen: Soft, nontender, nondistended, + bowel sounds  Extremities: warm dry without cyanosis clubbing or edema  Neuro: AAOx3, nonfocal  Psych: Normal affect and demeanor, pleasant   Discharge Instructions  Allergies as of 04/18/2019      Reactions   Statins Other (See Comments)   Myalgias, muscle weakness      Medication List    TAKE these medications   amLODipine 10 MG tablet Commonly known as: NORVASC Take  1 tablet by mouth once daily What changed: when to take this   apixaban 5 MG Tabs tablet Commonly known as: ELIQUIS Take 1 tablet  (5 mg total) by mouth 2 (two) times daily. What changed: when to take this   blood glucose meter kit and supplies Dispense based on patient and insurance preference. Use up to four times daily as directed. (FOR ICD-10 E10.9, E11.9).   docusate sodium 100 MG capsule Commonly known as: COLACE Take 100 mg by mouth daily with breakfast.   ezetimibe 10 MG tablet Commonly known as: ZETIA Take 1 tablet by mouth once daily What changed: when to take this   insulin aspart 100 UNIT/ML injection Commonly known as: novoLOG Inject 0-15 Units into the skin 3 (three) times daily with meals. CBG 121 - 150: 2 units;  CBG 151 - 200: 3 units;  CBG 201 - 250: 5 units;  CBG 251 - 300: 8 units; CBG 301 - 350: 11 units; CBG 351 - 400: 15 units;  CBG > 400 : 15 units and notify MD   insulin detemir 100 UNIT/ML injection Commonly known as: LEVEMIR Inject 0.18 mLs (18 Units total) into the skin daily. Start taking on: April 19, 2019   INSULIN SYRINGE .3CC/31GX5/16" 31G X 5/16" 0.3 ML Misc Use with insulin   isosorbide mononitrate 30 MG 24 hr tablet Commonly known as: IMDUR Take 1 tablet by mouth once daily What changed: when to take this   metoprolol succinate 100 MG 24 hr tablet Commonly known as: TOPROL-XL Take 1 tablet (100 mg total) by mouth daily. Take with or immediately following a meal. What changed: when to take this   tamsulosin 0.4 MG Caps capsule Commonly known as: FLOMAX Take 0.4 mg by mouth daily after supper.   vitamin B-12 500 MCG tablet Commonly known as: CYANOCOBALAMIN Take 500 mcg by mouth daily with breakfast.      Allergies  Allergen Reactions  . Statins Other (See Comments)    Myalgias, muscle weakness      The results of significant diagnostics from this hospitalization (including imaging, microbiology, ancillary and laboratory) are listed below for reference.    Significant Diagnostic Studies: Dg Chest 2 View  Result Date: 04/16/2019 CLINICAL DATA:   Patient stood up today and saw "stars" He reports intermittent headaches recently. His blood sugar read high on ems equipment. Per pt no chest pain nor other symptom now. Hx of diabetes, HTN, CAD, PAF, Right BBB, ischemic cardiomyopathy. Elevated Glucose EXAM: CHEST - 2 VIEW COMPARISON:  None. FINDINGS: Sternotomy wires overlie normal cardiac silhouette. Normal pulmonary vasculature. No effusion, infiltrate, or pneumothorax. No acute osseous abnormality. Of 5 IMPRESSION: No acute cardiopulmonary process. Electronically Signed   By: Suzy Bouchard M.D.   On: 04/16/2019 16:49    Microbiology: Recent Results (from the past 240 hour(s))  SARS Coronavirus 2 (CEPHEID - Performed in [place] hospital lab), Hosp Order     Status: None   Collection Time: 04/16/19  5:38 PM   Specimen: Nasopharyngeal Swab  Result Value Ref Range Status   SARS Coronavirus 2 NEGATIVE NEGATIVE Final    Comment: (NOTE) If result is NEGATIVE SARS-CoV-2 target nucleic acids are NOT DETECTED. The SARS-CoV-2 RNA is generally detectable in upper and lower  respiratory specimens during the acute phase of infection. The lowest  concentration of SARS-CoV-2 viral copies this assay can detect is 250  copies / mL. A negative result does not preclude SARS-CoV-2 infection  and should not be used  as the sole basis for treatment or other  patient management decisions.  A negative result may occur with  improper specimen collection / handling, submission of specimen other  than nasopharyngeal swab, presence of viral mutation(s) within the  areas targeted by this assay, and inadequate number of viral copies  (<250 copies / mL). A negative result must be combined with clinical  observations, patient history, and epidemiological information. If result is POSITIVE SARS-CoV-2 target nucleic acids are DETECTED. The SARS-CoV-2 RNA is generally detectable in upper and lower  respiratory specimens dur ing the acute phase of infection.   Positive  results are indicative of active infection with SARS-CoV-2.  Clinical  correlation with patient history and other diagnostic information is  necessary to determine patient infection status.  Positive results do  not rule out bacterial infection or co-infection with other viruses. If result is PRESUMPTIVE POSTIVE SARS-CoV-2 nucleic acids MAY BE PRESENT.   A presumptive positive result was obtained on the submitted specimen  and confirmed on repeat testing.  While 2019 novel coronavirus  (SARS-CoV-2) nucleic acids may be present in the submitted sample  additional confirmatory testing may be necessary for epidemiological  and / or clinical management purposes  to differentiate between  SARS-CoV-2 and other Sarbecovirus currently known to infect humans.  If clinically indicated additional testing with an alternate test  methodology [phone]) is advised. The SARS-CoV-2 RNA is generally  detectable in upper and lower respiratory sp ecimens during the acute  phase of infection. The expected result is Negative. Fact Sheet for Patients:  StrictlyIdeas.no Fact Sheet for Healthcare Providers: BankingDealers.co.za This test is not yet approved or cleared by the [place] FDA and has been authorized for detection and/or diagnosis of SARS-CoV-2 by FDA under an Emergency Use Authorization (EUA).  This EUA will remain in effect (meaning this test can be used) for the duration of the COVID-19 declaration under Section 564(b)(1) of the Act, 21 U.S.C. section 360bbb-3(b)(1), unless the authorization is terminated or revoked sooner. Performed at [facility], [place] [address], [place], [place] [address]      Labs: Basic Metabolic Panel: Recent Labs  Lab 04/16/19 1728 04/16/19 2216 04/17/19 0235 04/17/19 0942 04/18/19 0408  NA 126* 131* 134* 135 135  K 2.8* 3.3* 2.9* 2.7* 3.3*  CL 99 105 108 108 109  CO2 17* 18* 19* 20* 18*   GLUCOSE 499* 288* 180* 138* 190*  BUN 10 9 8  6* 5*  CREATININE 1.31* 1.21 1.15 1.25* 1.18  CALCIUM 8.0* 7.9* 8.1* 8.1* 8.5*  MG  --   --   --  1.6* 1.8   Liver Function Tests: No results for input(s): AST, ALT, ALKPHOS, BILITOT, PROT, ALBUMIN in the last 168 hours. No results for input(s): LIPASE, AMYLASE in the last 168 hours. No results for input(s): AMMONIA in the last 168 hours. CBC: Recent Labs  Lab 04/16/19 1452 04/16/19 1459 04/17/19 0942  WBC 6.4  --  6.3  HGB 10.2* 9.5* 9.7*  HCT 27.5* 28.0* 26.8*  MCV 88.7  --  90.2  PLT 143*  --  135*   Cardiac Enzymes: No results for input(s): CKTOTAL, CKMB, CKMBINDEX, TROPONINI in the last 168 hours. BNP: BNP (last 3 results) Recent Labs    12/15/18 0826  BNP 289.5*    ProBNP (last 3 results) No results for input(s): PROBNP in the last 8760 hours.  CBG: Recent Labs  Lab 04/17/19 1216 04/17/19 1810 04/17/19 2127 04/18/19 0003 04/18/19 0800  GLUCAP  197* 171* 198* 174* 216*       Signed:  Shaquon Gropp  [facility] 04/18/2019, 11:15 AM

## 2019-04-18 NOTE — NC FL2 (Signed)
[place] LEVEL OF CARE SCREENING TOOL     IDENTIFICATION  Patient Name: Steve [name]. Birthdate: 26-Feb-1941 Sex: male Admission Date (Current Location): 04/16/2019  [facility] and [place] Number:  Herbalist and Address:  [facility], [place] [address], [place], [place] [address]      Provider Number:    Attending Physician Name and Address:  Cristal Ford, DO  Relative Name and Phone Number:  Tally Mckinnon [facility]) [phone]    Current Level of Care: Hospital Recommended Level of Care: [place] Prior Approval Number:    Date Approved/Denied:   PASRR Number:    Discharge Plan: Other (Comment)([facility])    Current Diagnoses: Patient Active Problem List   Diagnosis Date Noted  . DKA (diabetic ketoacidoses) ([place]) 04/16/2019  . Atrial fibrillation ([place]) 12/15/2018  . CKD (chronic kidney disease), symptom management only, stage 4 (severe) ([place]) 09/08/2018  . Sepsis ([place]) 09/07/2018  . Diabetic ulcer of toe of left foot associated with type 2 diabetes mellitus ([place])   . Cellulitis of left anterior lower leg 07/25/2018  . Iron deficiency anemia 07/24/2018  . Acute kidney injury superimposed on chronic kidney disease ([place]) 07/24/2018  . Hyponatremia 07/24/2018  . Hypokalemia 07/24/2018  . Metabolic acidosis 83/43/7357  . Acute renal failure (ARF) ([place]) 07/24/2018  . Normocytic anemia 07/24/2018  . Thrombocytopenia ([place]) 07/24/2018  . Ischemic cardiomyopathy   . CAD (coronary artery disease)   . PAF (paroxysmal atrial fibrillation) ([place])   . Essential hypertension   . Type II diabetes mellitus ([place])   . Near syncope 04/01/2015  . Dizziness and giddiness 04/01/2015  . Fatigue 04/01/2015  . Chronic renal disease, stage II 04/01/2015  . RBBB 04/01/2015  . Angina pectoris ([place]) 04/01/2015  . PAT (paroxysmal atrial tachycardia) ([place]) 12/16/2011  . 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  . Diabetes mellitus, type 2  ([place]) 05/31/2011  . Hypertension   . PVD (peripheral vascular disease) ([place])   . Hyperlipidemia     Orientation RESPIRATION BLADDER Height & Weight     Self, Time, Situation, Place  Normal Continent Weight: 85.6 kg Height:  _0  (175.3 cm)  BEHAVIORAL SYMPTOMS/MOOD NEUROLOGICAL BOWEL NUTRITION STATUS      Continent Diet  AMBULATORY STATUS COMMUNICATION OF NEEDS Skin   Independent Verbally Normal                       Personal Care Assistance Level of Assistance  Bathing, Feeding, Dressing Bathing Assistance: Independent Feeding assistance: Independent Dressing Assistance: Independent     Functional Limitations Info  Sight, Hearing, Speech Sight Info: Adequate Hearing Info: Adequate Speech Info: Adequate    SPECIAL CARE FACTORS FREQUENCY                       Contractures Contractures Info: Not present    Additional Factors Info  Code Status, Allergies, Insulin Sliding Scale Code Status Info: DNR Allergies Info: Statins   Insulin Sliding Scale Info: see d/c summary       Current Medications (04/18/2019):  This is the current hospital active medication list Current Facility-Administered Medications  Medication Dose Route Frequency Provider Last Rate Last Dose  . 0.9 %  sodium chloride infusion   Intravenous Continuous Dalia Heading, PA-C   Stopped at 04/16/19 1929  . acetaminophen (TYLENOL) tablet 650 mg  650 mg Oral Q6H PRN Aline August, MD   650 mg at  04/17/19 2355   Or  . acetaminophen (TYLENOL) suppository 650 mg  650 mg Rectal Q6H PRN Starla Link, Kshitiz, MD      . amLODipine (NORVASC) tablet 10 mg  10 mg Oral Q supper Aline August, MD   10 mg at 04/17/19 1708  . apixaban (ELIQUIS) tablet 5 mg  5 mg Oral BID WC Aline August, MD   5 mg at 04/18/19 0814  . docusate sodium (COLACE) capsule 100 mg  100 mg Oral Q breakfast Aline August, MD   100 mg at 04/18/19 0814  . ezetimibe (ZETIA) tablet 10 mg  10 mg Oral Q supper Aline August, MD   10  mg at 04/17/19 1708  . insulin aspart (novoLOG) injection 0-15 Units  0-15 Units Subcutaneous TID WC Aline August, MD   5 Units at 04/18/19 0815  . insulin aspart (novoLOG) injection 0-5 Units  0-5 Units Subcutaneous QHS Alekh, Kshitiz, MD      . insulin detemir (LEVEMIR) injection 18 Units  18 Units Subcutaneous Q24H Aline August, MD   18 Units at 04/18/19 1007  . isosorbide mononitrate (IMDUR) 24 hr tablet 30 mg  30 mg Oral Q breakfast Starla Link, Kshitiz, MD   30 mg at 04/18/19 0813  . metoprolol succinate (TOPROL-XL) 24 hr tablet 100 mg  100 mg Oral Q supper Aline August, MD   100 mg at 04/17/19 1708  . ondansetron (ZOFRAN) tablet 4 mg  4 mg Oral Q6H PRN Aline August, MD       Or  . ondansetron (ZOFRAN) injection 4 mg  4 mg Intravenous Q6H PRN Alekh, Kshitiz, MD      . tamsulosin (FLOMAX) capsule 0.4 mg  0.4 mg Oral QPC supper Aline August, MD   0.4 mg at 04/17/19 1708  . vitamin B-12 (CYANOCOBALAMIN) tablet 500 mcg  500 mcg Oral Q breakfast Aline August, MD   500 mcg at 04/18/19 8119     Discharge Medications: Please see discharge summary for a list of discharge medications.  Relevant Imaging Results:  Relevant Lab Results:   Additional Information  SS# [id number]  Discharge Medications:  Allergies as of 04/18/2019      Reactions   Statins Other (See Comments)   Myalgias, muscle weakness         Medication List    TAKE these medications   amLODipine 10 MG tablet Commonly known as: NORVASC Take 1 tablet by mouth once daily What changed: when to take this   apixaban 5 MG Tabs tablet Commonly known as: ELIQUIS Take 1 tablet (5 mg total) by mouth 2 (two) times daily. What changed: when to take this   blood glucose meter kit and supplies Dispense based on patient and insurance preference. Use up to four times daily as directed. (FOR ICD-10 E10.9, E11.9).   docusate sodium 100 MG capsule Commonly known as: COLACE Take 100 mg by mouth daily with  breakfast.   ezetimibe 10 MG tablet Commonly known as: ZETIA Take 1 tablet by mouth once daily What changed: when to take this   insulin aspart 100 UNIT/ML injection Commonly known as: novoLOG Inject 0-15 Units into the skin 3 (three) times daily with meals. CBG 121 - 150: 2 units;  CBG 151 - 200: 3 units;  CBG 201 - 250: 5 units;  CBG 251 - 300: 8 units; CBG 301 - 350: 11 units; CBG 351 - 400: 15 units;  CBG > 400 : 15 units and notify MD   insulin detemir 100 UNIT/ML  injection Commonly known as: LEVEMIR Inject 0.18 mLs (18 Units total) into the skin daily. Start taking on: April 19, 2019   INSULIN SYRINGE .3CC/31GX5/16" 31G X 5/16" 0.3 ML Misc Use with insulin   isosorbide mononitrate 30 MG 24 hr tablet Commonly known as: IMDUR Take 1 tablet by mouth once daily What changed: when to take this   metoprolol succinate 100 MG 24 hr tablet Commonly known as: TOPROL-XL Take 1 tablet (100 mg total) by mouth daily. Take with or immediately following a meal. What changed: when to take this   tamsulosin 0.4 MG Caps capsule Commonly known as: FLOMAX Take 0.4 mg by mouth daily after supper.   vitamin B-12 500 MCG tablet Commonly known as: CYANOCOBALAMIN Take 500 mcg by mouth daily with breakfast.           Allergies  Allergen Reactions  . Statins Other (See Comments)    Myalgias, muscle weakness   Sharin Mons, RN, [facility]

## 2019-04-18 NOTE — Progress Notes (Signed)
Discharge instructions complete. Meds, diet, activity and follow up appointments reviewed and all questions answered. Copy of instructions given to patient.

## 2019-04-19 ENCOUNTER — Telehealth: Payer: Self-pay | Admitting: Physician Assistant

## 2019-04-19 NOTE — Telephone Encounter (Signed)
Patient wants to reschedule, admin staff was informed and will follow up

## 2019-04-24 ENCOUNTER — Telehealth: Payer: Medicare Other | Admitting: Physician Assistant

## 2019-04-26 ENCOUNTER — Encounter: Payer: Self-pay | Admitting: Physician Assistant

## 2019-04-26 ENCOUNTER — Telehealth (INDEPENDENT_AMBULATORY_CARE_PROVIDER_SITE_OTHER): Payer: Medicare Other | Admitting: Physician Assistant

## 2019-04-26 NOTE — Progress Notes (Signed)
Visit cancelled.

## 2019-05-10 ENCOUNTER — Other Ambulatory Visit: Payer: Self-pay | Admitting: Cardiology

## 2019-05-11 NOTE — Telephone Encounter (Signed)
This is Dr. Jordan's pt. °

## 2019-05-21 ENCOUNTER — Other Ambulatory Visit: Payer: Self-pay | Admitting: *Deleted

## 2019-05-21 MED ORDER — AMLODIPINE BESYLATE 10 MG PO TABS: 10.0000 mg | ORAL_TABLET | Freq: Every day | ORAL | 0 refills | Status: DC

## 2019-05-21 MED ORDER — ISOSORBIDE MONONITRATE ER 30 MG PO TB24: 30.0000 mg | ORAL_TABLET | Freq: Every day | ORAL | 0 refills | Status: DC

## 2019-05-21 MED ORDER — EZETIMIBE 10 MG PO TABS: 10.0000 mg | ORAL_TABLET | Freq: Every day | ORAL | 0 refills | Status: DC

## 2019-09-03 ENCOUNTER — Other Ambulatory Visit: Payer: Self-pay | Admitting: Cardiology

## 2019-09-03 NOTE — Telephone Encounter (Signed)
Rx has been sent to the pharmacy electronically. ° °

## 2019-09-27 ENCOUNTER — Other Ambulatory Visit: Payer: Self-pay | Admitting: Cardiology

## 2019-11-09 ENCOUNTER — Other Ambulatory Visit: Payer: Self-pay | Admitting: Cardiology

## 2020-03-07 IMAGING — CR CHEST - 2 VIEW
3 series · 3 of 3 positions shown · non-contrast
Comparison: None.

CLINICAL DATA: Patient stood up today and saw "stars" He reports
intermittent headaches recently. His blood sugar read high on ems
equipment. Per pt no chest pain nor other symptom now. Hx of
diabetes, HTN, CAD, PAF, Right BBB, ischemic cardiomyopathy.
Elevated Glucose

EXAM:
CHEST - 2 VIEW

[chest lat]
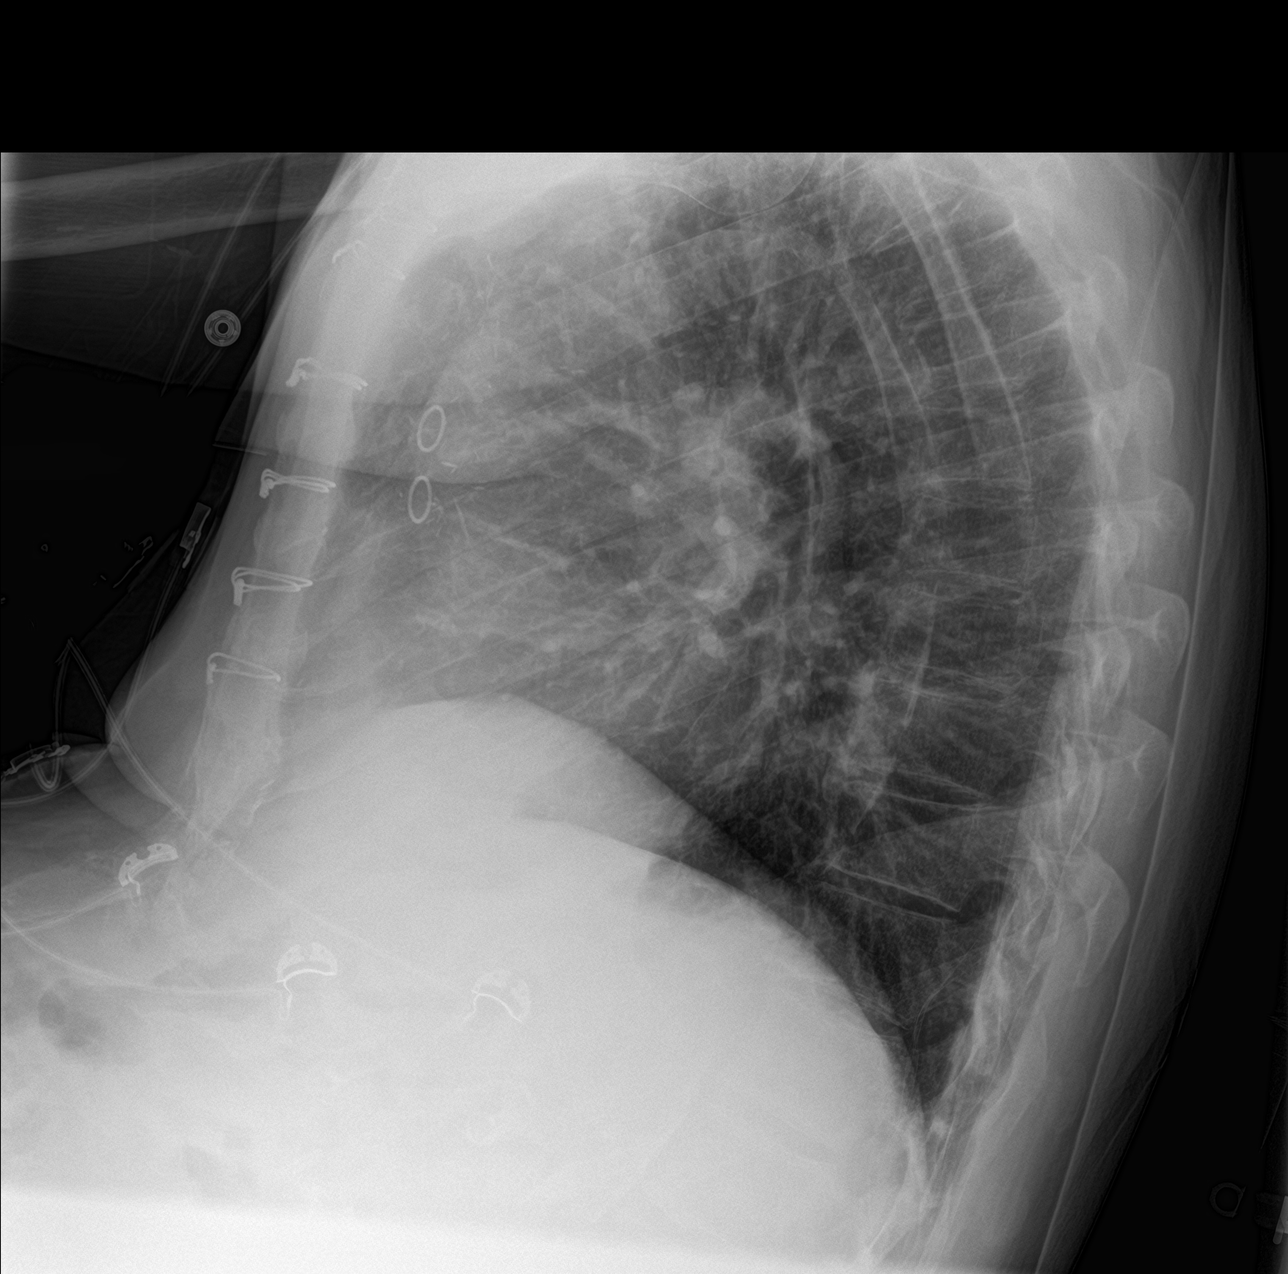

[chest ap (1 of 2)]
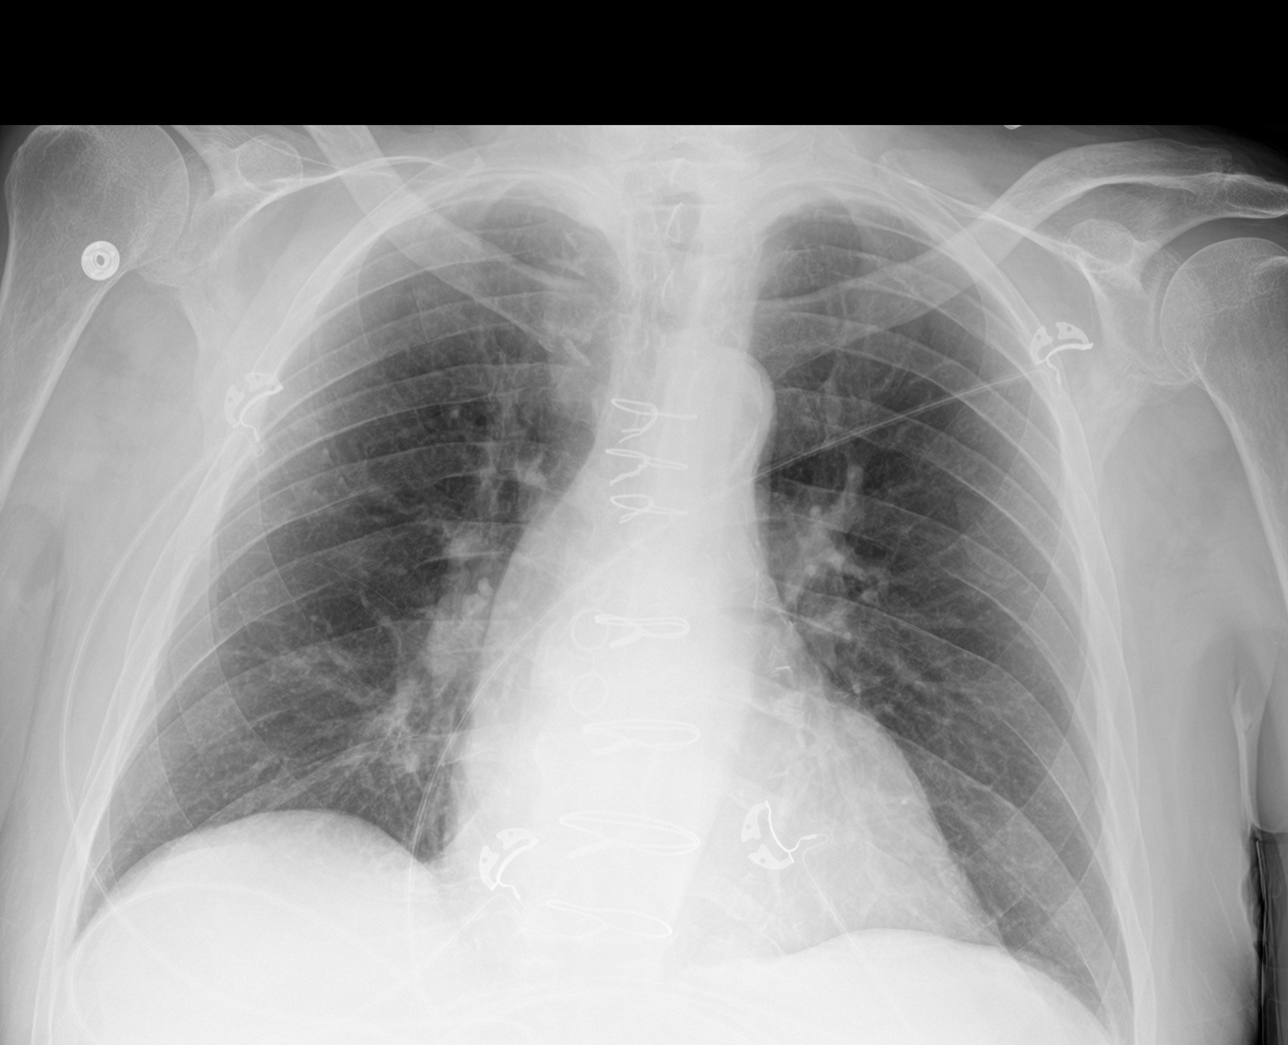

[chest ap (2 of 2)]
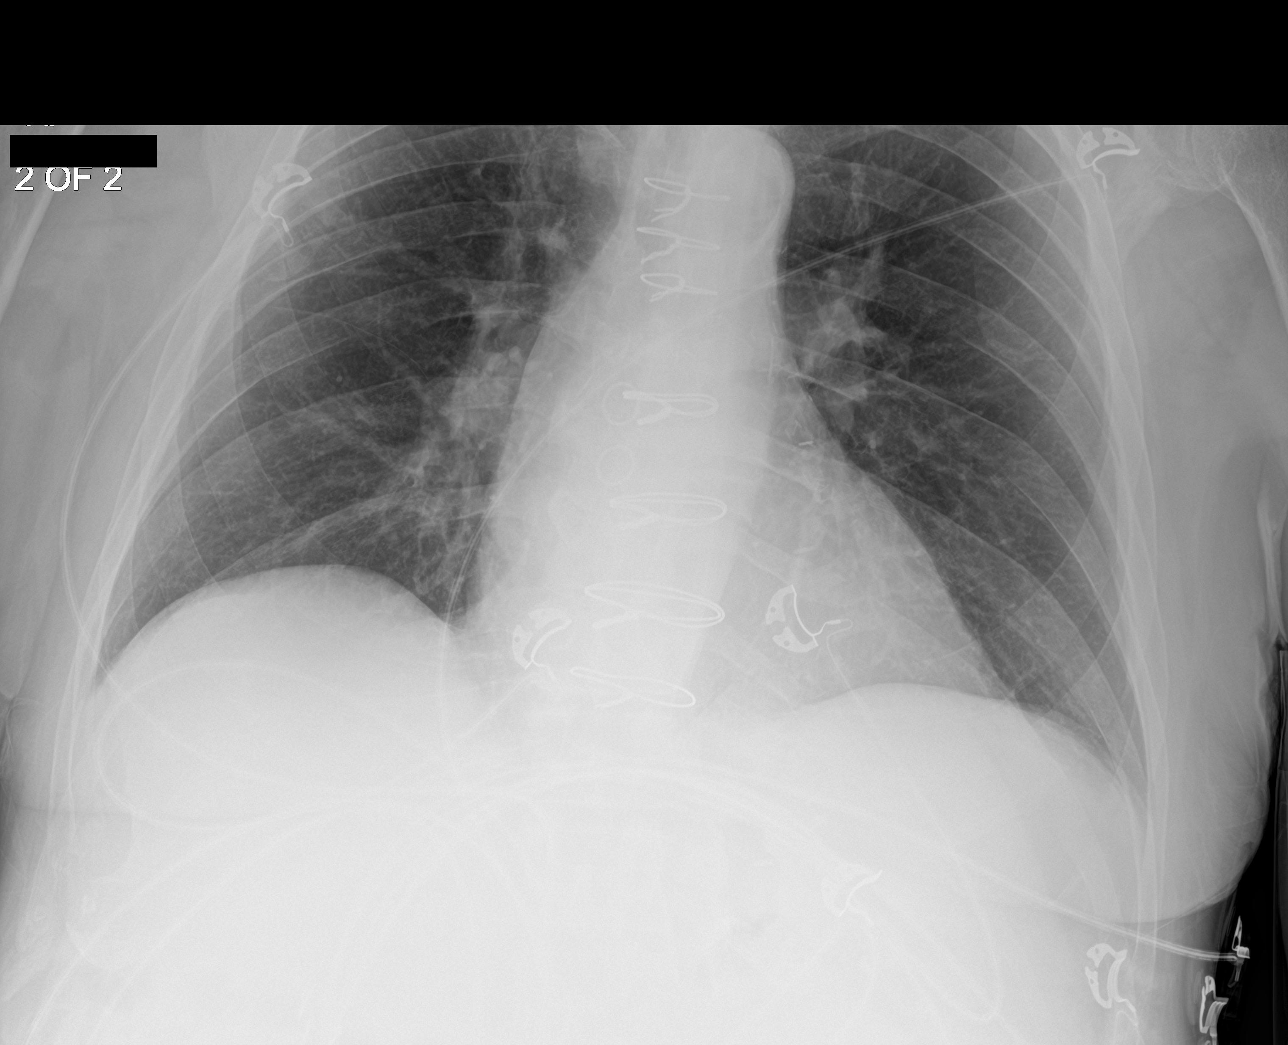

[3 of 3 positions shown; findings below may reference images not displayed]

FINDINGS: Sternotomy wires overlie normal cardiac silhouette. Normal pulmonary
vasculature. No effusion, infiltrate, or pneumothorax. No acute
osseous abnormality. Of 5
IMPRESSION: No acute cardiopulmonary process.

## 2020-05-19 ENCOUNTER — Emergency Department (HOSPITAL_COMMUNITY)
Admission: EM | Admit: 2020-05-19 | Discharge: 2020-05-20 | Disposition: A | Discharge status: Home / Self Care | Payer: Medicare Other | Attending: Emergency Medicine | Admitting: Emergency Medicine

## 2020-05-19 ENCOUNTER — Other Ambulatory Visit: Payer: Self-pay

## 2020-05-19 ENCOUNTER — Encounter (HOSPITAL_COMMUNITY): Payer: Self-pay

## 2020-05-19 DIAGNOSIS — I129 Hypertensive chronic kidney disease with stage 1 through stage 4 chronic kidney disease, or unspecified chronic kidney disease: Secondary | ICD-10-CM | POA: Insufficient documentation

## 2020-05-19 DIAGNOSIS — Z7901 Long term (current) use of anticoagulants: Secondary | ICD-10-CM | POA: Diagnosis not present

## 2020-05-19 DIAGNOSIS — Z794 Long term (current) use of insulin: Secondary | ICD-10-CM | POA: Insufficient documentation

## 2020-05-19 DIAGNOSIS — N184 Chronic kidney disease, stage 4 (severe): Secondary | ICD-10-CM | POA: Diagnosis not present

## 2020-05-19 DIAGNOSIS — E1122 Type 2 diabetes mellitus with diabetic chronic kidney disease: Secondary | ICD-10-CM | POA: Insufficient documentation

## 2020-05-19 DIAGNOSIS — R531 Weakness: Secondary | ICD-10-CM | POA: Diagnosis present

## 2020-05-19 DIAGNOSIS — Z20822 Contact with and (suspected) exposure to covid-19: Secondary | ICD-10-CM | POA: Diagnosis not present

## 2020-05-19 DIAGNOSIS — Z951 Presence of aortocoronary bypass graft: Secondary | ICD-10-CM | POA: Insufficient documentation

## 2020-05-19 DIAGNOSIS — Z79899 Other long term (current) drug therapy: Secondary | ICD-10-CM | POA: Diagnosis not present

## 2020-05-19 DIAGNOSIS — R42 Dizziness and giddiness: Secondary | ICD-10-CM | POA: Insufficient documentation

## 2020-05-19 DIAGNOSIS — Z87891 Personal history of nicotine dependence: Secondary | ICD-10-CM | POA: Diagnosis not present

## 2020-05-19 DIAGNOSIS — I2510 Atherosclerotic heart disease of native coronary artery without angina pectoris: Secondary | ICD-10-CM | POA: Insufficient documentation

## 2020-05-19 LAB — CBC
HCT: 34.8 % — ABNORMAL LOW (ref 39.0–52.0)
Hemoglobin: 11.7 g/dL — ABNORMAL LOW (ref 13.0–17.0)
MCH: 33.8 pg (ref 26.0–34.0)
MCHC: 33.6 g/dL (ref 30.0–36.0)
MCV: 100.6 fL — ABNORMAL HIGH (ref 80.0–100.0)
Platelets: 120 10*3/uL — ABNORMAL LOW (ref 150–400)
RBC: 3.46 MIL/uL — ABNORMAL LOW (ref 4.22–5.81)
RDW: 13.2 % (ref 11.5–15.5)
WBC: 5.5 10*3/uL (ref 4.0–10.5)
nRBC: 0 % (ref 0.0–0.2)

## 2020-05-19 LAB — BASIC METABOLIC PANEL
Anion gap: 9 (ref 5–15)
BUN: 16 mg/dL (ref 8–23)
CO2: 21 mmol/L — ABNORMAL LOW (ref 22–32)
Calcium: 8.8 mg/dL — ABNORMAL LOW (ref 8.9–10.3)
Chloride: 107 mmol/L (ref 98–111)
Creatinine, Ser: 1.61 mg/dL — ABNORMAL HIGH (ref 0.61–1.24)
GFR calc Af Amer: 46 mL/min — ABNORMAL LOW (ref >60)
GFR calc non Af Amer: 40 mL/min — ABNORMAL LOW (ref >60)
Glucose, Bld: 118 mg/dL — ABNORMAL HIGH (ref 70–99)
Potassium: 4.5 mmol/L (ref 3.5–5.1)
Sodium: 137 mmol/L (ref 135–145)

## 2020-05-19 MED ORDER — SODIUM CHLORIDE 0.9 % IV BOLUS
1000.0000 mL | Freq: Once | INTRAVENOUS | Status: AC
  Administered 2020-05-20: 1000 mL via INTRAVENOUS

## 2020-05-19 MED ORDER — SODIUM CHLORIDE 0.9% FLUSH
3.0000 mL | Freq: Once | INTRAVENOUS | Status: AC
  Administered 2020-05-20: 3 mL via INTRAVENOUS

## 2020-05-19 NOTE — ED Triage Notes (Signed)
PT presents to ED from PCP d/t lightheadedness and generalized weakness x 3-4 days. PT endorses feeling lightheaded when standing. PT MD concerned because pt reports feeling "snap" in neck when turning head last week. No speech problems or unilateral weakness.

## 2020-05-19 NOTE — ED Provider Notes (Signed)
[facility] EMERGENCY DEPARTMENT Provider Note   CSN: [id number] Arrival date & time: 05/19/20  1219     History Chief Complaint  Patient presents with  . Weakness    Steve [name]. is a 79 y.o. male.  The history is provided by the patient and medical records. No language interpreter was used.  Weakness    79 year old male with history of paroxysmal atrial fibrillation currently on Eliquis, CAD, hypertension, peripheral artery disease, diabetes sent here from PCP office for valuation of generalized weakness.  Patient report approximately 3 to 4 days ago he got up in the morning and felt okay however when he turned his head he felt a crack in the back of his head, and when he stood up he felt imbalance.  During that time he also experienced some chest discomfort in which he described more of his indigestion lasting for approximately 15 minutes and resolved.  Since then, patient report feeling imbalance and states that his right leg seems to be heavier than usual.  This morning he still endorse feeling weak and imbalance, and was checking his blood pressure in which he noticed blood pressure is quite high with systolic greater than 086.  He normally walks with a cane but having more trouble ambulating.  He denies any active pain, no headache, no vision changes, no double vision, no confusion, no additional chest pain or shortness of breath abdominal pain back pain or arm pain.  He did discuss with his primary care doctor about his symptoms today and was recommended to come to the ER for further evaluation.  He denies any recent medication changes.  He is up-to-date with Covid vaccination.  He is currently living at an assisted living facility.  Past Medical History:  Diagnosis Date  . CAD (coronary artery disease)    a. s/p CABG x 29 Nov 2011 with LIMA to LAD, SVG to OM1 and distal LCX, SVG to ramus intermediate  . Essential hypertension   . Hyperlipidemia   .  Intermittent claudication ([place])   . Ischemic cardiomyopathy    a. 03/2015 Echo: EF45-50%, Gr1 DD, mild MR.  Marland Kitchen PAF (paroxysmal atrial fibrillation) ([place])    a. post op atrial fib 11/2011; short course of amiodarone; stopped 01/25/12  . Peripheral arterial disease ([place])   . Right bundle branch block   . Type II diabetes mellitus [facility])     Patient Active Problem List   Diagnosis Date Noted  . DKA (diabetic ketoacidoses) ([place]) 04/16/2019  . Atrial fibrillation ([place]) 12/15/2018  . CKD (chronic kidney disease), symptom management only, stage 4 (severe) ([place]) 09/08/2018  . Sepsis ([place]) 09/07/2018  . Diabetic ulcer of toe of left foot associated with type 2 diabetes mellitus ([place])   . Cellulitis of left anterior lower leg 07/25/2018  . Iron deficiency anemia 07/24/2018  . Acute kidney injury superimposed on chronic kidney disease ([place]) 07/24/2018  . Hyponatremia 07/24/2018  . Hypokalemia 07/24/2018  . Metabolic acidosis 57/84/6962  . Acute renal failure (ARF) ([place]) 07/24/2018  . Normocytic anemia 07/24/2018  . Thrombocytopenia ([place]) 07/24/2018  . Ischemic cardiomyopathy   . CAD (coronary artery disease)   . PAF (paroxysmal atrial fibrillation) ([place])   . Essential hypertension   . Type II diabetes mellitus ([place])   . Near syncope 04/01/2015  . Dizziness and giddiness 04/01/2015  . Fatigue 04/01/2015  . Chronic renal disease, stage II 04/01/2015  . RBBB 04/01/2015  . Angina pectoris ([place]) 04/01/2015  . PAT (  paroxysmal atrial tachycardia) ([place]) 12/16/2011  . 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  . Diabetes mellitus, type 2 ([place]) 05/31/2011  . Hypertension   . PVD (peripheral vascular disease) ([place])   . Hyperlipidemia     Past Surgical History:  Procedure Laterality Date  . CARDIAC CATHETERIZATION  01/16/1999   Est. EF of 65% -- Nonobstruction atherosclerotic coronary artery disease -- Normal left ventricular function      . CARDIAC ELECTROPHYSIOLOGY STUDY AND ABLATION  05/21/1999   Normal sinus  funtion -- Mildly prolonged interatrial conduction times -- Normal A-V node funtion -- Normal His Purkinje system function -- fNo accessory pathway -- No inducible ventricular tachycardia in the presence of the or in the absence of isoproterenol with programmed stimulation or with burst pacing -- Nikki Dom, M.D. FACC surgan          . CHOLECYSTECTOMY  1999  . CORONARY ARTERY BYPASS GRAFT  12/10/2011   Procedure: CORONARY ARTERY BYPASS GRAFTING (CABG);  Surgeon: Tharon Aquas Adelene Idler, MD;  Location: [place];  Service: Open Heart Surgery;  Laterality: N/A;  Coronary Artery bypass graft on pump times four utilizing left internal mammary artery and left saphenous vein harvested endoscopically  . LEFT HEART CATH AND CORS/GRAFTS ANGIOGRAPHY N/A 12/18/2018   Procedure: LEFT HEART CATH AND CORS/GRAFTS ANGIOGRAPHY;  Surgeon: Belva Crome, MD;  Location: [facility] CV LAB;  Service: Cardiovascular;  Laterality: N/A;  . LEFT HEART CATHETERIZATION WITH CORONARY ANGIOGRAM N/A 12/09/2011   Procedure: LEFT HEART CATHETERIZATION WITH CORONARY ANGIOGRAM;  Surgeon: Peter M Martinique, MD;  Location: [facility] CATH LAB;  Service: Cardiovascular;  Laterality: N/A;  . LOWER EXTREMITY ANGIOGRAM Left 07/28/2018   Procedure: Lower Extremity Angiogram;  Surgeon: Angelia Mould, MD;  Location: [place] CV LAB;  Service: Cardiovascular;  Laterality: Left;       Family History  Problem Relation Age of Onset  . Stroke Mother 60       Cerebellar hemorrhage    Social History   Tobacco Use  . Smoking status: Former Smoker    Packs/day: 1.50    Years: 20.00    Pack years: 30.00    Types: Cigarettes    Quit date: 05/27/1979    Years since quitting: 41.0  . Smokeless tobacco: Former Network engineer  . Vaping Use: Never used  Substance Use Topics  . Alcohol use: Yes    Alcohol/week: 4.0 standard drinks    Types: 4 Shots of liquor per week    Comment: 4 "canadian clubs each night"  . Drug use: No    Home  Medications Prior to Admission medications   Medication Sig Start Date End Date Taking? Authorizing Provider  amLODipine (NORVASC) 10 MG tablet TAKE 1 TABLET DAILY 11/09/19   Lendon Colonel, NP  apixaban (ELIQUIS) 5 MG TABS tablet Take 1 tablet (5 mg total) by mouth 2 (two) times daily. Patient taking differently: Take 5 mg by mouth 2 (two) times daily with a meal.  07/29/18   Ina Homes, MD  blood glucose meter kit and supplies Dispense based on patient and insurance preference. Use up to four times daily as directed. (FOR ICD-10 E10.9, E11.9). 04/18/19   Mikhail, Velta Addison, DO  docusate sodium (COLACE) 100 MG capsule Take 100 mg by mouth daily with breakfast.     [provider]  ezetimibe (ZETIA) 10 MG tablet TAKE 1 TABLET DAILY 09/27/19   Martinique, Peter M, MD  insulin aspart (NOVOLOG) 100 UNIT/ML injection Inject 0-15  Units into the skin 3 (three) times daily with meals. CBG 121 - 150: 2 units;  CBG 151 - 200: 3 units;  CBG 201 - 250: 5 units;  CBG 251 - 300: 8 units; CBG 301 - 350: 11 units; CBG 351 - 400: 15 units;  CBG > 400 : 15 units and notify MD 04/18/19   Cristal Ford, DO  insulin detemir (LEVEMIR) 100 UNIT/ML injection Inject 0.18 mLs (18 Units total) into the skin daily. 04/19/19   Mikhail, Velta Addison, DO  Insulin Syringe-Needle U-100 (INSULIN SYRINGE .3CC/31GX5/16") 31G X 5/16" 0.3 ML MISC Use with insulin 04/18/19   Cristal Ford, DO  isosorbide mononitrate (IMDUR) 30 MG 24 hr tablet TAKE 1 TABLET DAILY 09/27/19   Martinique, Peter M, MD  metoprolol succinate (TOPROL-XL) 100 MG 24 hr tablet Take 1 tablet (100 mg total) by mouth daily. Take with or immediately following a meal. Patient taking differently: Take 100 mg by mouth daily with supper. Take with or immediately following a meal. 12/19/18   Reino Bellis B, NP  tamsulosin (FLOMAX) 0.4 MG CAPS capsule Take 0.4 mg by mouth daily after supper.  11/01/18   [provider]  vitamin B-12 (CYANOCOBALAMIN) 500 MCG  tablet Take 500 mcg by mouth daily with breakfast.     [provider]  furosemide (LASIX) 40 MG tablet Take 1 tablet (40 mg total) by mouth daily. For 4 days then stop. 12/15/11 04/26/19  Nani Skillern, PA-C    Allergies    Statins  Review of Systems   Review of Systems  Neurological: Positive for weakness.  All other systems reviewed and are negative.   Physical Exam Updated Vital Signs BP (!) 186/91   Pulse 67   Temp 97.9 F (36.6 C) (Oral)   Resp 18   Ht 5' 9"  (1.753 m)   Wt 86.2 kg   SpO2 98%   BMI 28.06 kg/m   Physical Exam Vitals and nursing note reviewed.  Constitutional:      General: He is not in acute distress.    Appearance: He is well-developed.     Comments: Elderly male laying in bed appears to be in no acute distress.  He is edentulous.  HENT:     Head: Atraumatic.     Right Ear: Tympanic membrane normal.     Left Ear: Tympanic membrane normal.     Nose:     Comments: Dried blood noted in right nares Eyes:     Extraocular Movements: Extraocular movements intact.     Conjunctiva/sclera: Conjunctivae normal.     Pupils: Pupils are equal, round, and reactive to light.     Comments: Right pupil 2 mm and reactive, left pupil 3 mm and reactive.  Neck:     Vascular: No carotid bruit.  Cardiovascular:     Rate and Rhythm: Normal rate and regular rhythm.     Pulses: Normal pulses.     Heart sounds: Normal heart sounds.  Pulmonary:     Effort: Pulmonary effort is normal.     Breath sounds: Normal breath sounds.  Abdominal:     Palpations: Abdomen is soft.     Tenderness: There is no abdominal tenderness.  Musculoskeletal:     Cervical back: Normal range of motion and neck supple. No rigidity or tenderness.  Skin:    Findings: No rash.  Neurological:     Mental Status: He is alert.     GCS: GCS eye subscore is 4. GCS verbal subscore  is 5. GCS motor subscore is 6.     Cranial Nerves: Cranial nerves are intact.     Comments: Neurologic  exam:  Speech clear, pupils  round reactive to light, extraocular movements intact  Normal peripheral visual fields Cranial nerves III through XII normal including no facial droop Follows commands, moves all extremities x4, normal strength to bilateral upper and lower extremities at all major muscle groups including grip Sensation normal to light touch  Coordination intact, no limb ataxia, finger-nose-finger normal No pronator drift Gait unsteady      ED Results / Procedures / Treatments   Labs (all labs ordered are listed, but only abnormal results are displayed) Labs Reviewed  BASIC METABOLIC PANEL - Abnormal; Notable for the following components:      Result Value   CO2 21 (*)    Glucose, Bld 118 (*)    Creatinine, Ser 1.61 (*)    Calcium 8.8 (*)    GFR calc non Af Amer 40 (*)    GFR calc Af Amer 46 (*)    All other components within normal limits  CBC - Abnormal; Notable for the following components:   RBC 3.46 (*)    Hemoglobin 11.7 (*)    HCT 34.8 (*)    MCV 100.6 (*)    Platelets 120 (*)    All other components within normal limits  URINALYSIS, ROUTINE W REFLEX MICROSCOPIC - Abnormal; Notable for the following components:   Color, Urine STRAW (*)    Protein, ur 30 (*)    Bacteria, UA RARE (*)    All other components within normal limits  SARS CORONAVIRUS 2 BY RT PCR (HOSPITAL ORDER, [facility])  CBG MONITORING, ED  TROPONIN I (HIGH SENSITIVITY)  TROPONIN I (HIGH SENSITIVITY)    EKG EKG Interpretation  Date/Time:  Monday May 19 2020 13:09:46 EDT Ventricular Rate:  65 PR Interval:    QRS Duration: 152 QT Interval:  486 QTC Calculation: 505 R Axis:   -56 Text Interpretation: Wide QRS rhythm with occasional Premature ventricular complexes Right bundle branch block Left anterior fascicular block  Bifascicular block  Left ventricular hypertrophy with repolarization abnormality ( R in aVL , Romhilt-Estes ) Possible Lateral infarct ,  age undetermined Abnormal ECG Confirmed by Dene Gentry [phone]) on 05/19/2020 11:08:49 PM   Radiology CT Angio Head W or Wo Contrast  Result Date: 05/20/2020 CLINICAL DATA:  Initial evaluation for acute dizziness. EXAM: CT ANGIOGRAPHY HEAD AND NECK TECHNIQUE: Multidetector CT imaging of the head and neck was performed using the standard protocol during bolus administration of intravenous contrast. Multiplanar CT image reconstructions and MIPs were obtained to evaluate the vascular anatomy. Carotid stenosis measurements (when applicable) are obtained utilizing NASCET criteria, using the distal internal carotid diameter as the denominator. CONTRAST:  58m OMNIPAQUE IOHEXOL 350 MG/ML SOLN COMPARISON:  Prior study from 05/01/2015. FINDINGS: CT HEAD FINDINGS Brain: Generalized age-related cerebral atrophy with mild chronic small vessel ischemic disease. Few scattered remote subcentimeter bilateral cerebellar infarcts noted. No acute intracranial hemorrhage. No acute large vessel territory infarct. No mass lesion, midline shift or mass effect. No hydrocephalus or extra-axial fluid collection. Vascular: No hyperdense vessel. Calcified atherosclerosis present at skull base. Skull: Scalp soft tissues and calvarium within normal limits. Sinuses: Paranasal sinuses are clear. No significant mastoid effusion. Orbits: Globes and orbital soft tissues demonstrate no acute finding. CTA NECK FINDINGS Aortic arch: Visualized aortic arch of normal caliber with normal 3 vessel morphology. Moderate atheromatous change about  the visualized arch and origin of the great vessels without hemodynamically significant stenosis. Right carotid system: Right CCA patent from its origin to the bifurcation without stenosis. Bulky calcified plaque about the right bifurcation with associated stenosis of up to 75% by NASCET criteria. Right ICA patent distally to the skull base without stenosis, dissection, or occlusion. Left carotid system: Left  CCA patent from its origin to the bifurcation without stenosis. Scattered calcified plaque about the left bifurcation/proximal left ICA with associated stenosis of up to 40% by NASCET criteria. Left ICA tortuous but otherwise patent to the skull base without stenosis, dissection, or occlusion. Vertebral arteries: Both vertebral arteries arise from the subclavian arteries. No significant proximal subclavian artery stenosis. Calcified plaque at the origins of both vertebral arteries with no more than 40-50% stenoses bilaterally. Additional short-segment moderate approximate 50% segmental stenosis noted at the mid right V2 segment at the level of C4-5 (series 9, image 242). Otherwise, vertebral arteries widely patent within the neck without stenosis, dissection, or occlusion. Skeleton: No acute osseous abnormality. No discrete or worrisome osseous lesions. Patient is edentulous. Mild-to-moderate multilevel cervical spondylosis noted. Ankylosis of the C6 and C7 vertebral bodies noted as well. Other neck: No other acute soft tissue abnormality within the neck. No mass lesion or adenopathy. Upper chest: Visualized upper chest demonstrates no acute finding. Partially visualized lungs are grossly clear. Review of the MIP images confirms the above findings CTA HEAD FINDINGS Anterior circulation: Petrous segments patent bilaterally. Diffuse atheromatous plaque throughout the cavernous/supraclinoid ICAs bilaterally. Associated moderate diffuse narrowing on the right (estimated up to 50-70%). More mild to moderate diffuse narrowing seen on the left (estimated 30-50%). ICA termini well perfused. Dominant left A1 widely patent. Right A1 hypoplastic and/or absent, accounting for the diminutive right ICA is compared to the left. Normal anterior communicating artery complex. Anterior cerebral arteries mildly tortuous but patent to their distal aspects without stenosis. Right M1 widely patent. Normal right MCA bifurcation. Distal  right MCA branches well perfused. Left M1 duplicated and/or divides early at the level of the left ICA terminus. Superior division widely patent. There is a short-segment severe stenosis involving the mid aspect of the left M2 inferior division (series 14, image 52). Left MCA branches well perfused distally. Posterior circulation: Both vertebral arteries widely patent as they course into the cranial vault. Scattered calcified plaque about the V4 segments bilaterally with associated moderate to severe multifocal stenoses on the right, with more moderate multifocal stenoses on the left. Vertebral arteries remain patent to the vertebrobasilar junction. Both picas are patent. Basilar mildly irregular but remains patent to its distal aspect without high-grade stenosis. Superior cerebral arteries patent bilaterally. Both PCAs primarily supplied via the basilar well perfused to their distal aspects. Venous sinuses: Patent. Anatomic variants: Hypoplastic/absent right A1, with the anterior cerebral artery supplied via the left carotid artery system. Left M1 bifurcates early at the left ICA terminus. No intracranial aneurysm. Review of the MIP images confirms the above findings IMPRESSION: 1. Negative CTA for large vessel occlusion. 2. 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 3. Atheromatous plaque at the origin of both vertebral arteries with estimated 40-50% stenoses bilaterally. Additional moderate to severe multifocal bilateral V4 stenoses, right worse than left. 4. Left M1 segment bifurcates early at the left ICA terminus, with associated severe short-segment left M2 stenosis, inferior division. 5. Diffuse atheromatous change throughout the carotid siphons with associated moderate diffuse stenosis,  right worse than left. Electronically Signed   By: Jeannine Boga M.D.   On: 05/20/2020 02:10   CT Angio Neck W and/or Wo  Contrast  Result Date: 05/20/2020 CLINICAL DATA:  Initial evaluation for acute dizziness. EXAM: CT ANGIOGRAPHY HEAD AND NECK TECHNIQUE: Multidetector CT imaging of the head and neck was performed using the standard protocol during bolus administration of intravenous contrast. Multiplanar CT image reconstructions and MIPs were obtained to evaluate the vascular anatomy. Carotid stenosis measurements (when applicable) are obtained utilizing NASCET criteria, using the distal internal carotid diameter as the denominator. CONTRAST:  17m OMNIPAQUE IOHEXOL 350 MG/ML SOLN COMPARISON:  Prior study from 05/01/2015. FINDINGS: CT HEAD FINDINGS Brain: Generalized age-related cerebral atrophy with mild chronic small vessel ischemic disease. Few scattered remote subcentimeter bilateral cerebellar infarcts noted. No acute intracranial hemorrhage. No acute large vessel territory infarct. No mass lesion, midline shift or mass effect. No hydrocephalus or extra-axial fluid collection. Vascular: No hyperdense vessel. Calcified atherosclerosis present at skull base. Skull: Scalp soft tissues and calvarium within normal limits. Sinuses: Paranasal sinuses are clear. No significant mastoid effusion. Orbits: Globes and orbital soft tissues demonstrate no acute finding. CTA NECK FINDINGS Aortic arch: Visualized aortic arch of normal caliber with normal 3 vessel morphology. Moderate atheromatous change about the visualized arch and origin of the great vessels without hemodynamically significant stenosis. Right carotid system: Right CCA patent from its origin to the bifurcation without stenosis. Bulky calcified plaque about the right bifurcation with associated stenosis of up to 75% by NASCET criteria. Right ICA patent distally to the skull base without stenosis, dissection, or occlusion. Left carotid system: Left CCA patent from its origin to the bifurcation without stenosis. Scattered calcified plaque about the left bifurcation/proximal  left ICA with associated stenosis of up to 40% by NASCET criteria. Left ICA tortuous but otherwise patent to the skull base without stenosis, dissection, or occlusion. Vertebral arteries: Both vertebral arteries arise from the subclavian arteries. No significant proximal subclavian artery stenosis. Calcified plaque at the origins of both vertebral arteries with no more than 40-50% stenoses bilaterally. Additional short-segment moderate approximate 50% segmental stenosis noted at the mid right V2 segment at the level of C4-5 (series 9, image 242). Otherwise, vertebral arteries widely patent within the neck without stenosis, dissection, or occlusion. Skeleton: No acute osseous abnormality. No discrete or worrisome osseous lesions. Patient is edentulous. Mild-to-moderate multilevel cervical spondylosis noted. Ankylosis of the C6 and C7 vertebral bodies noted as well. Other neck: No other acute soft tissue abnormality within the neck. No mass lesion or adenopathy. Upper chest: Visualized upper chest demonstrates no acute finding. Partially visualized lungs are grossly clear. Review of the MIP images confirms the above findings CTA HEAD FINDINGS Anterior circulation: Petrous segments patent bilaterally. Diffuse atheromatous plaque throughout the cavernous/supraclinoid ICAs bilaterally. Associated moderate diffuse narrowing on the right (estimated up to 50-70%). More mild to moderate diffuse narrowing seen on the left (estimated 30-50%). ICA termini well perfused. Dominant left A1 widely patent. Right A1 hypoplastic and/or absent, accounting for the diminutive right ICA is compared to the left. Normal anterior communicating artery complex. Anterior cerebral arteries mildly tortuous but patent to their distal aspects without stenosis. Right M1 widely patent. Normal right MCA bifurcation. Distal right MCA branches well perfused. Left M1 duplicated and/or divides early at the level of the left ICA terminus. Superior division  widely patent. There is a short-segment severe stenosis involving the mid aspect of the left M2 inferior division (series 14, image 52). Left  MCA branches well perfused distally. Posterior circulation: Both vertebral arteries widely patent as they course into the cranial vault. Scattered calcified plaque about the V4 segments bilaterally with associated moderate to severe multifocal stenoses on the right, with more moderate multifocal stenoses on the left. Vertebral arteries remain patent to the vertebrobasilar junction. Both picas are patent. Basilar mildly irregular but remains patent to its distal aspect without high-grade stenosis. Superior cerebral arteries patent bilaterally. Both PCAs primarily supplied via the basilar well perfused to their distal aspects. Venous sinuses: Patent. Anatomic variants: Hypoplastic/absent right A1, with the anterior cerebral artery supplied via the left carotid artery system. Left M1 bifurcates early at the left ICA terminus. No intracranial aneurysm. Review of the MIP images confirms the above findings IMPRESSION: 1. Negative CTA for large vessel occlusion. 2. 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 3. Atheromatous plaque at the origin of both vertebral arteries with estimated 40-50% stenoses bilaterally. Additional moderate to severe multifocal bilateral V4 stenoses, right worse than left. 4. Left M1 segment bifurcates early at the left ICA terminus, with associated severe short-segment left M2 stenosis, inferior division. 5. Diffuse atheromatous change throughout the carotid siphons with associated moderate diffuse stenosis, right worse than left. Electronically Signed   By: Jeannine Boga M.D.   On: 05/20/2020 02:10   MR BRAIN WO CONTRAST  Result Date: 05/20/2020 CLINICAL DATA:  Central vertigo for 3-4 days EXAM: MRI HEAD WITHOUT CONTRAST TECHNIQUE: Multiplanar, multiecho pulse  sequences of the brain and surrounding structures were obtained without intravenous contrast. COMPARISON:  04/01/2015 FINDINGS: Brain: No acute infarction, hemorrhage, hydrocephalus, extra-axial collection or mass lesion. 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 Mild supratentorial chronic small vessel ischemic injury which is also stable. Nonprogressive cerebral volume loss. Vascular: Preserved flow voids.  There was preceding CTA Skull and upper cervical spine: Normal marrow signal Sinuses/Orbits: Right cataract resection. There is a metallic foreign body lateral to the left orbit with associated artifact, stable. IMPRESSION: 1. No emergent finding or detected change from 2016. 2. Small remote cerebellar infarcts.  Generalized brain atrophy. Electronically Signed   By: Monte Fantasia M.D.   On: 05/20/2020 06:28    Procedures Procedures (including critical care time)  Medications Ordered in ED Medications  sodium chloride flush (NS) 0.9 % injection 3 mL (3 mLs Intravenous Given 05/20/20 0009)  sodium chloride 0.9 % bolus 1,000 mL (0 mLs Intravenous Stopped 05/20/20 0613)  iohexol (OMNIPAQUE) 350 MG/ML injection 64 mL (64 mLs Intravenous Contrast Given 05/20/20 0025)    ED Course  I have reviewed the triage vital signs and the nursing notes.  Pertinent labs & imaging results that were available during my care of the patient were reviewed by me and considered in my medical decision making (see chart for details).    MDM Rules/Calculators/A&P                          BP (!) 188/74   Pulse 56   Temp 97.6 F (36.4 C) (Oral)   Resp 20   Ht 5' 9"  (1.753 m)   Wt 86.2 kg   SpO2 99%   BMI 28.06 kg/m   Final Clinical Impression(s) / ED Diagnoses Final diagnoses:  Lightheadedness    Rx / DC Orders ED Discharge Orders    None     11:09 PM Patient here with complaints  of lightheadedness and feeling imbalance with difficulty walking.  Last known normal was 3  to 4 days ago.  He did not complain of any acute onset thunderclap headache.  On exam, he has no obvious focal neuro deficit however minimally decreased in strength in his right lower extremity compared to left and was unsteady with his gait.  Given his symptoms, I would like to obtain brain MRI to rule out a posterior circulation stroke.  Head and neck CTA scan ordered. Care discussed with Dr. Dina Rich.    2:52 AM Head and neck CTA show no large vessel occlusion however there are multiple atheromatous plaque about the carotid bifurcation with associated stenosis as well as plaque of both vertebral arteries with signs of stenosis.  Patient would likely need a full stroke work-up.  Will consult neurology for recommendation.  Mild impaired renal function with a creatinine of 1.61.  IV fluid given.  UA UA without signs of urine tract infection.  EKG without acute ischemic changes.  3:15 AM Appreciate consultation from on call neurologist DR. Rory Percy who recommend brain MRI.  If negative, pt does not need to be admitted for stroke work up as he is currently on Eliquis.  Pt made aware of plan.   7:00 AM Brain MRI did not show any emergent finding all changes since 2016.  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  At this time patient able to ambulate therefore he is stable for discharge with outpatient follow-up for further managements of his condition.  Patient voiced understanding and agrees with plan.   Domenic Moras, PA-C 05/20/20 3220    Merryl Hacker, MD 05/21/20 [phone]

## 2020-05-20 ENCOUNTER — Emergency Department (HOSPITAL_COMMUNITY): Payer: Medicare Other

## 2020-05-20 DIAGNOSIS — R42 Dizziness and giddiness: Secondary | ICD-10-CM | POA: Diagnosis not present

## 2020-05-20 LAB — URINALYSIS, ROUTINE W REFLEX MICROSCOPIC
Bilirubin Urine: NEGATIVE
Glucose, UA: NEGATIVE mg/dL
Hgb urine dipstick: NEGATIVE
Ketones, ur: NEGATIVE mg/dL
Leukocytes,Ua: NEGATIVE
Nitrite: NEGATIVE
Protein, ur: 30 mg/dL — AB
Specific Gravity, Urine: 1.013 (ref 1.005–1.030)
pH: 5 (ref 5.0–8.0)

## 2020-05-20 LAB — TROPONIN I (HIGH SENSITIVITY)
Troponin I (High Sensitivity): 14 ng/L (ref <18)
Troponin I (High Sensitivity): 15 ng/L (ref <18)

## 2020-05-20 LAB — SARS CORONAVIRUS 2 BY RT PCR (HOSPITAL ORDER, PERFORMED IN ~~LOC~~ HOSPITAL LAB): SARS Coronavirus 2: NEGATIVE

## 2020-05-20 MED ORDER — IOHEXOL 350 MG/ML SOLN
64.0000 mL | Freq: Once | INTRAVENOUS | Status: AC | PRN
  Administered 2020-05-20: 64 mL via INTRAVENOUS

## 2020-05-20 NOTE — Discharge Instructions (Addendum)
Please follow-up closely with your doctor for further managements of your condition.  Fortunately no evidence of stroke was found during this visit.

## 2020-05-20 NOTE — ED Notes (Signed)
Urine culture sent with u/a

## 2020-09-22 ENCOUNTER — Other Ambulatory Visit: Payer: Self-pay | Admitting: Cardiology

## 2020-11-04 ENCOUNTER — Other Ambulatory Visit: Payer: Self-pay

## 2020-11-05 ENCOUNTER — Other Ambulatory Visit: Payer: Self-pay

## 2020-11-05 MED ORDER — EZETIMIBE 10 MG PO TABS: 10.0000 mg | ORAL_TABLET | Freq: Every day | ORAL | 3 refills | Status: DC

## 2020-11-13 ENCOUNTER — Other Ambulatory Visit: Payer: Self-pay

## 2021-02-23 ENCOUNTER — Other Ambulatory Visit: Payer: Self-pay

## 2021-02-23 MED ORDER — ISOSORBIDE MONONITRATE ER 30 MG PO TB24: 30.0000 mg | ORAL_TABLET | Freq: Every day | ORAL | 3 refills | Status: DC

## 2021-03-06 ENCOUNTER — Other Ambulatory Visit: Payer: Self-pay

## 2021-06-01 ENCOUNTER — Other Ambulatory Visit: Payer: Self-pay

## 2021-06-01 MED ORDER — ISOSORBIDE MONONITRATE ER 30 MG PO TB24: 30.0000 mg | ORAL_TABLET | Freq: Every day | ORAL | 3 refills | Status: DC

## 2021-12-01 ENCOUNTER — Other Ambulatory Visit: Payer: Self-pay

## 2021-12-01 ENCOUNTER — Ambulatory Visit: Payer: Medicare Other | Admitting: Podiatry

## 2021-12-01 ENCOUNTER — Ambulatory Visit (INDEPENDENT_AMBULATORY_CARE_PROVIDER_SITE_OTHER): Payer: Medicare Other

## 2021-12-01 ENCOUNTER — Ambulatory Visit (INDEPENDENT_AMBULATORY_CARE_PROVIDER_SITE_OTHER): Payer: Medicare Other | Admitting: Podiatry

## 2021-12-01 DIAGNOSIS — B351 Tinea unguium: Secondary | ICD-10-CM | POA: Diagnosis not present

## 2021-12-01 DIAGNOSIS — G629 Polyneuropathy, unspecified: Secondary | ICD-10-CM | POA: Diagnosis not present

## 2021-12-01 DIAGNOSIS — I739 Peripheral vascular disease, unspecified: Secondary | ICD-10-CM | POA: Diagnosis not present

## 2021-12-01 DIAGNOSIS — M79672 Pain in left foot: Secondary | ICD-10-CM | POA: Diagnosis not present

## 2021-12-01 DIAGNOSIS — M79671 Pain in right foot: Secondary | ICD-10-CM

## 2021-12-01 DIAGNOSIS — M79675 Pain in left toe(s): Secondary | ICD-10-CM

## 2021-12-01 DIAGNOSIS — E119 Type 2 diabetes mellitus without complications: Secondary | ICD-10-CM

## 2021-12-01 DIAGNOSIS — E1149 Type 2 diabetes mellitus with other diabetic neurological complication: Secondary | ICD-10-CM

## 2021-12-01 DIAGNOSIS — M722 Plantar fascial fibromatosis: Secondary | ICD-10-CM | POA: Diagnosis not present

## 2021-12-01 DIAGNOSIS — M79674 Pain in right toe(s): Secondary | ICD-10-CM

## 2021-12-01 DIAGNOSIS — L84 Corns and callosities: Secondary | ICD-10-CM

## 2021-12-01 MED ORDER — GABAPENTIN 100 MG PO CAPS: 100.0000 mg | ORAL_CAPSULE | Freq: Every day | ORAL | 0 refills | Status: DC

## 2021-12-01 NOTE — Progress Notes (Signed)
Dg  

## 2021-12-01 NOTE — Patient Instructions (Signed)
Gabapentin Capsules or Tablets ?What is this medication? ?GABAPENTIN (GA ba pen tin) treats nerve pain. It may also be used to prevent and control seizures in people with epilepsy. It works by calming overactive nerves in your body. ?This medicine may be used for other purposes; ask your health care provider or pharmacist if you have questions. ?COMMON BRAND NAME(S): Active-PAC with Gabapentin, Gabarone, Gralise, Neurontin ?What should I tell my care team before I take this medication? ?They need to know if you have any of these conditions: ?Alcohol or substance use disorder ?Kidney disease ?Lung or breathing disease ?Suicidal thoughts, plans, or attempt; a previous suicide attempt by you or a family member ?An unusual or allergic reaction to gabapentin, other medications, foods, dyes, or preservatives ?Pregnant or trying to get pregnant ?Breast-feeding ?How should I use this medication? ?Take this medication by mouth with a glass of water. Follow the directions on the prescription label. You can take it with or without food. If it upsets your stomach, take it with food. Take your medication at regular intervals. Do not take it more often than directed. Do not stop taking except on your care team's advice. ?If you are directed to break the 600 or 800 mg tablets in half as part of your dose, the extra half tablet should be used for the next dose. If you have not used the extra half tablet within 28 days, it should be thrown away. ?A special MedGuide will be given to you by the pharmacist with each prescription and refill. Be sure to read this information carefully each time. ?Talk to your care team about the use of this medication in children. While this medication may be prescribed for children as young as 3 years for selected conditions, precautions do apply. ?Overdosage: If you think you have taken too much of this medicine contact a poison control center or emergency room at once. ?NOTE: This medicine is only for  you. Do not share this medicine with others. ?What if I miss a dose? ?If you miss a dose, take it as soon as you can. If it is almost time for your next dose, take only that dose. Do not take double or extra doses. ?What may interact with this medication? ?Alcohol ?Antihistamines for allergy, cough, and cold ?Certain medications for anxiety or sleep ?Certain medications for depression like amitriptyline, fluoxetine, sertraline ?Certain medications for seizures like phenobarbital, primidone ?Certain medications for stomach problems ?General anesthetics like halothane, isoflurane, methoxyflurane, propofol ?Local anesthetics like lidocaine, pramoxine, tetracaine ?Medications that relax muscles for surgery ?Opioid medications for pain ?Phenothiazines like chlorpromazine, mesoridazine, prochlorperazine, thioridazine ?This list may not describe all possible interactions. Give your health care provider a list of all the medicines, herbs, non-prescription drugs, or dietary supplements you use. Also tell them if you smoke, drink alcohol, or use illegal drugs. Some items may interact with your medicine. ?What should I watch for while using this medication? ?Visit your care team for regular checks on your progress. You may want to keep a record at home of how you feel your condition is responding to treatment. You may want to share this information with your care team at each visit. You should contact your care team if your seizures get worse or if you have any new types of seizures. Do not stop taking this medication or any of your seizure medications unless instructed by your care team. Stopping your medication suddenly can increase your seizures or their severity. ?This medication may cause serious skin   reactions. They can happen weeks to months after starting the medication. Contact your care team right away if you notice fevers or flu-like symptoms with a rash. The rash may be red or purple and then turn into blisters or  peeling of the skin. Or, you might notice a red rash with swelling of the face, lips or lymph nodes in your neck or under your arms. ?Wear a medical identification bracelet or chain if you are taking this medication for seizures. Carry a card that lists all your medications. ?This medication may affect your coordination, reaction time, or judgment. Do not drive or operate machinery until you know how this medication affects you. Sit up or stand slowly to reduce the risk of dizzy or fainting spells. Drinking alcohol with this medication can increase the risk of these side effects. ?Your mouth may get dry. Chewing sugarless gum or sucking hard candy, and drinking plenty of water may help. ?Watch for new or worsening thoughts of suicide or depression. This includes sudden changes in mood, behaviors, or thoughts. These changes can happen at any time but are more common in the beginning of treatment or after a change in dose. Call your care team right away if you experience these thoughts or worsening depression. ?If you become pregnant while using this medication, you may enroll in the [place] Antiepileptic Drug Pregnancy Registry by calling 1-888-233-2334. This registry collects information about the safety of antiepileptic medication use during pregnancy. ?What side effects may I notice from receiving this medication? ?Side effects that you should report to your care team as soon as possible: ?Allergic reactions or angioedema--skin rash, itching, hives, swelling of the face, eyes, lips, tongue, arms, or legs, trouble swallowing or breathing ?Rash, fever, and swollen lymph nodes ?Thoughts of suicide or self harm, worsening mood, feelings of depression ?Trouble breathing ?Unusual changes in mood or behavior in children after use such as difficulty concentrating, hostility, or restlessness ?Side effects that usually do not require medical attention (report to your care team if they continue or are  bothersome): ?Dizziness ?Drowsiness ?Nausea ?Swelling of ankles, feet, or hands ?Vomiting ?This list may not describe all possible side effects. Call your doctor for medical advice about side effects. You may report side effects to FDA at 1-800-FDA-1088. ?Where should I keep my medication? ?Keep out of reach of children and pets. ?Store at room temperature between 15 and 30 degrees C (59 and 86 degrees F). Get rid of any unused medication after the expiration date. ?This medication may cause accidental overdose and death if taken by other adults, children, or pets. ?To get rid of medications that are no longer needed or have expired: ?Take the medication to a medication take-back program. Check with your pharmacy or law enforcement to find a location. ?If you cannot return the medication, check the label or package insert to see if the medication should be thrown out in the garbage or flushed down the toilet. If you are not sure, ask your care team. If it is safe to put it in the trash, empty the medication out of the container. Mix the medication with cat litter, dirt, coffee grounds, or other unwanted substance. Seal the mixture in a bag or container. Put it in the trash. ?NOTE: This sheet is a summary. It may not cover all possible information. If you have questions about this medicine, talk to your doctor, pharmacist, or health care provider. ?? 2022 Elsevier/Gold Standard (2021-04-13 00:00:00) ? ?

## 2021-12-03 ENCOUNTER — Telehealth: Payer: Self-pay | Admitting: Podiatry

## 2021-12-03 ENCOUNTER — Other Ambulatory Visit: Payer: Self-pay | Admitting: Cardiology

## 2021-12-03 NOTE — Telephone Encounter (Signed)
Called pt to get scheduled for a diabetic shoe exam and he is not interested in getting diabetic shoes. I told him to let us know if he changes his mind.

## 2021-12-03 NOTE — Progress Notes (Signed)
Subjective:   Patient ID: [name], male   DOB: 81 y.o.   MRN: [id number]   HPI 81 year old male presents the office today for concerns regarding, numbness to his feet.  He states that he has discomfort as well mostly to the bottom.  This started about 3 years ago he denies any recent injury or trauma.  No increase in swelling.  He states he previously was told that he does not have circulation to his feet.  Nails are also thick, elongated he cannot trim them himself, causing discomfort.  No swelling or redness of the toenail sites.  Requesting diabetic shoes   Review of Systems  All other systems reviewed and are negative.  Past Medical History:  Diagnosis Date   CAD (coronary artery disease)    a. s/p CABG x 29 Nov 2011 with LIMA to LAD, SVG to OM1 and distal LCX, SVG to ramus intermediate   Essential hypertension    Hyperlipidemia    Intermittent claudication ([place])    Ischemic cardiomyopathy    a. 03/2015 Echo: EF45-50%, Gr1 DD, mild MR.   PAF (paroxysmal atrial fibrillation) ([place])    a. post op atrial fib 11/2011; short course of amiodarone; stopped 01/25/12   Peripheral arterial disease ([place])    Right bundle branch block    Type II diabetes mellitus ([place])     Past Surgical History:  Procedure Laterality Date   CARDIAC CATHETERIZATION  01/16/1999   Est. EF of 65% -- Nonobstruction atherosclerotic coronary artery disease -- Normal left ventricular function       CARDIAC ELECTROPHYSIOLOGY STUDY AND ABLATION  05/21/1999   Normal sinus funtion -- Mildly prolonged interatrial conduction times -- Normal A-V node funtion -- Normal His Purkinje system function -- fNo accessory pathway -- No inducible ventricular tachycardia in the presence of the or in the absence of isoproterenol with programmed stimulation or with burst pacing -- Nikki Dom, M.D. [place]   CORONARY ARTERY BYPASS GRAFT  12/10/2011   Procedure: CORONARY ARTERY BYPASS  GRAFTING (CABG);  Surgeon: Tharon Aquas Adelene Idler, MD;  Location: [facility];  Service: Open Heart Surgery;  Laterality: N/A;  Coronary Artery bypass graft on pump times four utilizing left internal mammary artery and left saphenous vein harvested endoscopically   LEFT HEART CATH AND CORS/GRAFTS ANGIOGRAPHY N/A 12/18/2018   Procedure: LEFT HEART CATH AND CORS/GRAFTS ANGIOGRAPHY;  Surgeon: Belva Crome, MD;  Location: [facility];  Service: Cardiovascular;  Laterality: N/A;   LEFT HEART CATHETERIZATION WITH CORONARY ANGIOGRAM N/A 12/09/2011   Procedure: LEFT HEART CATHETERIZATION WITH CORONARY ANGIOGRAM;  Surgeon: Peter M Martinique, MD;  Location: [facility] CATH [facility];  Service: Cardiovascular;  Laterality: N/A;   LOWER EXTREMITY ANGIOGRAM Left 07/28/2018   Procedure: Lower Extremity Angiogram;  Surgeon: Angelia Mould, MD;  Location: [facility];  Service: Cardiovascular;  Laterality: Left;     Current Outpatient Medications:    gabapentin (NEURONTIN) 100 MG capsule, Take 1 capsule (100 mg total) by mouth at bedtime., Disp: 90 capsule, Rfl: 0   apixaban (ELIQUIS) 5 MG TABS tablet, Take 1 tablet (5 mg total) by mouth 2 (two) times daily. (Patient taking differently: Take 5 mg by mouth 2 (two) times daily with a meal. ), Disp: 60 tablet, Rfl: 0   blood glucose meter kit and supplies, Dispense based on patient and insurance preference. Use up to four times daily  as directed. (FOR ICD-10 E10.9, E11.9)., Disp: 1 each, Rfl: 1   ezetimibe (ZETIA) 10 MG tablet, Take 1 tablet (10 mg total) by mouth daily., Disp: 90 tablet, Rfl: 3   insulin detemir (LEVEMIR) 100 UNIT/ML injection, Inject 0.18 mLs (18 Units total) into the skin daily. (Patient taking differently: Inject 12 Units into the skin daily. ), Disp: 10 mL, Rfl: 2   Insulin Syringe-Needle U-100 (INSULIN SYRINGE .3CC/31GX5/16") 31G X 5/16" 0.3 ML MISC, Use with insulin, Disp: 100 each, Rfl: 2   isosorbide mononitrate (IMDUR) 30 MG 24 hr tablet, Take 1  tablet (30 mg total) by mouth daily., Disp: 30 tablet, Rfl: 3   metFORMIN (GLUCOPHAGE) 1000 MG tablet, Take 1,000 mg by mouth daily., Disp: , Rfl:    metFORMIN (GLUCOPHAGE) 500 MG tablet, Take 500 mg by mouth every evening., Disp: , Rfl:    metoprolol succinate (TOPROL-XL) 100 MG 24 hr tablet, Take 1 tablet (100 mg total) by mouth daily. Take with or immediately following a meal. (Patient taking differently: Take 100 mg by mouth daily with supper. Take with or immediately following a meal.), Disp: 30 tablet, Rfl: 3   tamsulosin (FLOMAX) 0.4 MG CAPS capsule, Take 0.4 mg by mouth daily after supper. , Disp: , Rfl:    vitamin B-12 (CYANOCOBALAMIN) 500 MCG tablet, Take 500 mcg by mouth daily with breakfast. , Disp: , Rfl:   Allergies  Allergen Reactions   Statins Other (See Comments)    Myalgias, muscle weakness          Objective:  Physical Exam  General: AAO x3, NAD  Dermatological: Nails are hypertrophic, dystrophic, brittle, discolored, elongated 10. No surrounding redness or drainage. Tenderness nails 1-5 bilaterally.  Preulcerative callus noted on the left fifth digit without any underlying ulceration drainage or signs of action.  There is no open lesions bilaterally.  Vascular: Dorsalis Pedis artery and Posterior Tibial artery pedal pulses are decreased bilateral. There is no pain with calf compression, swelling, warmth, erythema.   Neruologic: Sensation decreased to [employer] monofilament  Musculoskeletal: Hammertoes present.  MMT 4/5.     Assessment:   81 year old male with symptomatic onychomycosis, PAD, neuropathy     Plan:  -Treatment options discussed including all alternatives, risks, and complications -Etiology of symptoms were discussed -Nails debrided 10 without complications or bleeding. -He would like to start medication given the burning, pain to his feet.  We will start low-dose gabapentin 100 mg at nighttime and we discussed side effects of  medication.  If he is not able to tolerate this to let me know otherwise we can increase the dose as tolerated. -Updated referral to vascular surgery given PAD -Daily foot inspection -Discussed general stretching exercises and she has good arch support.  He  will follow-up for diabetic shoes. -Follow-up in 3 months or sooner if any problems arise. In the meantime, encouraged to call the office with any questions, concerns, change in symptoms.   Celesta Gentile, DPM

## 2021-12-16 ENCOUNTER — Other Ambulatory Visit: Payer: Self-pay | Admitting: Podiatry

## 2021-12-16 DIAGNOSIS — G629 Polyneuropathy, unspecified: Secondary | ICD-10-CM

## 2021-12-23 ENCOUNTER — Other Ambulatory Visit: Payer: Self-pay | Admitting: Cardiology

## 2021-12-28 ENCOUNTER — Other Ambulatory Visit: Payer: Self-pay

## 2021-12-28 DIAGNOSIS — I739 Peripheral vascular disease, unspecified: Secondary | ICD-10-CM

## 2022-01-07 ENCOUNTER — Ambulatory Visit (INDEPENDENT_AMBULATORY_CARE_PROVIDER_SITE_OTHER): Payer: Medicare Other | Admitting: Vascular Surgery

## 2022-01-07 ENCOUNTER — Ambulatory Visit (HOSPITAL_COMMUNITY)
Admission: RE | Admit: 2022-01-07 | Discharge: 2022-01-07 | Disposition: A | Discharge status: Home / Self Care | Payer: Medicare Other | Source: Ambulatory Visit | Attending: Vascular Surgery | Admitting: Vascular Surgery

## 2022-01-07 ENCOUNTER — Encounter: Payer: Self-pay | Admitting: Vascular Surgery

## 2022-01-07 ENCOUNTER — Other Ambulatory Visit: Payer: Self-pay

## 2022-01-07 VITALS — BP 209/88 | HR 76 | Temp 98.0°F | Resp 20 | Ht 69.0 in | Wt 199.0 lb

## 2022-01-07 DIAGNOSIS — I739 Peripheral vascular disease, unspecified: Secondary | ICD-10-CM | POA: Insufficient documentation

## 2022-01-07 NOTE — Progress Notes (Signed)
? ?ASSESSMENT & PLAN  ? ?PERIPHERAL ARTERIAL DISEASE: This patient has evidence of infrainguinal arterial occlusive disease bilaterally.  Based on his previous arteriogram he had severe tibial artery occlusive disease on the left with no further options for revascularization.  I suspect that he has a similar situation on the right side.  Fortunately, currently he is not having significant symptoms except for some paresthesias and burning in his feet.  This is more significant on the right side.  I have encouraged him to stay as active as possible.  Fortunately he is not a smoker.  I explained that if his symptoms progress or he develops rest pain or nonhealing ulcer we would consider arteriography to evaluate the right side.  Me know on the left he really does not have any options.  He is not real anxious to consider any procedures at this point understandably. ? ?REASON FOR CONSULT:   ? ?Peripheral arterial disease.  The consult is requested by Dr. Jacqualyn Posey. ? ?HPI:  ? ?Steve [name]. is a 81 y.o. male who was referred for evaluation of peripheral arterial disease.  I have reviewed the notes from the referring office.  The patient was seen on 12/01/2021.  He was complaining of numbness in his feet.  This it started about 3 years ago.  He was noted to have evidence of peripheral arterial disease and was sent for vascular consultation. ? ?Of note, I had previously seen this patient back in 2019.  He was seen with wounds on his left foot and underwent an arteriogram which showed no options for revascularization of the left lower extremity.  He had marked calcific disease in the aorta and iliac arteries in addition to the common femoral and superficial femoral artery on the left.  The popliteal artery was occluded below the knee with severe tibial artery occlusive disease and no named vessels distally.  Based on his arteriogram there were no options for revascularization of the left lower extremity. ? ?On my history  today, he does describe burning pain in both feet especially on the right side.  When I had seen him years ago he was having significant pain on the left and he states that pain has essentially resolved.  He denies any history of rest pain.  He denies any history of nonhealing ulcers.  He quit smoking over 40 years ago. ? ?There have been no significant changes to his medical history.  He is on Eliquis for atrial fibrillation.  He also has a history of ischemic cardiomyopathy and coronary artery disease.  In addition he has type 2 diabetes and hypertension. ? ?Past Medical History:  ?Diagnosis Date  ? CAD (coronary artery disease)   ? a. s/p CABG x 29 Nov 2011 with LIMA to LAD, SVG to OM1 and distal LCX, SVG to ramus intermediate  ? Essential hypertension   ? Hyperlipidemia   ? Intermittent claudication (HCC)   ? Ischemic cardiomyopathy   ? a. 03/2015 Echo: EF45-50%, Gr1 DD, mild MR.  ? PAF (paroxysmal atrial fibrillation) ([place])   ? a. post op atrial fib 11/2011; short course of amiodarone; stopped 01/25/12  ? Peripheral arterial disease ([place])   ? Right bundle branch block   ? Type II diabetes mellitus ([place])   ? ? ?Family History  ?Problem Relation Age of Onset  ? Stroke Mother 31  ?     Cerebellar hemorrhage  ? ? ?SOCIAL HISTORY: ?Social History  ? ?Tobacco Use  ? Smoking status: Former  ?  Packs/day: 1.50  ?  Years: 20.00  ?  Pack years: 30.00  ?  Types: Cigarettes  ?  Quit date: 05/27/1979  ?  Years since quitting: 42.6  ? Smokeless tobacco: Former  ?Substance Use Topics  ? Alcohol use: Yes  ?  Alcohol/week: 4.0 standard drinks  ?  Types: 4 Shots of liquor per week  ?  Comment: 4 "can[employer] clubs each night"  ? ? ?Allergies  ?Allergen Reactions  ? Statins Other (See Comments)  ?  Myalgias, muscle weakness  ? ? ?Current Outpatient Medications  ?Medication Sig Dispense Refill  ? blood glucose meter kit and supplies Dispense based on patient and insurance preference. Use up to four times daily as directed. (FOR ICD-10  E10.9, E11.9). 1 each 1  ? doxazosin (CARDURA) 2 MG tablet Take 2 mg by mouth daily.    ? ELIQUIS 2.5 MG TABS tablet Take 2.5 mg by mouth 2 (two) times daily.    ? ezetimibe (ZETIA) 10 MG tablet Take 1 tablet (10 mg total) by mouth daily. 90 tablet 3  ? gabapentin (NEURONTIN) 100 MG capsule Take 1 capsule (100 mg total) by mouth at bedtime. 90 capsule 0  ? insulin detemir (LEVEMIR) 100 UNIT/ML injection Inject 0.18 mLs (18 Units total) into the skin daily. (Patient taking differently: Inject 12 Units into the skin daily.) 10 mL 2  ? Insulin Syringe-Needle U-100 (INSULIN SYRINGE .3CC/31GX5/16") 31G X 5/16" 0.3 ML MISC Use with insulin 100 each 2  ? isosorbide mononitrate (IMDUR) 30 MG 24 hr tablet TAKE 1 TABLET DAILY (NEED OFFICE VISIT FOR REFILLS - 1ST ATTEMPT) 15 tablet 0  ? metFORMIN (GLUCOPHAGE) 1000 MG tablet Take 1,000 mg by mouth daily.    ? metFORMIN (GLUCOPHAGE) 500 MG tablet Take 500 mg by mouth every evening.    ? metoprolol succinate (TOPROL-XL) 100 MG 24 hr tablet Take 1 tablet (100 mg total) by mouth daily. Take with or immediately following a meal. (Patient taking differently: Take 100 mg by mouth daily with supper. Take with or immediately following a meal.) 30 tablet 3  ? tamsulosin (FLOMAX) 0.4 MG CAPS capsule Take 0.4 mg by mouth daily after supper.     ? vitamin B-12 (CYANOCOBALAMIN) 500 MCG tablet Take 500 mcg by mouth daily with breakfast.     ? JANUVIA 50 MG tablet Take 50 mg by mouth daily. (Patient not taking: Reported on 01/07/2022)    ? ?No current facility-administered medications for this visit.  ? ? ?REVIEW OF SYSTEMS:  ?[X]  denotes positive finding, [ ]  denotes negative finding ?Cardiac  Comments:  ?Chest pain or chest pressure:    ?Shortness of breath upon exertion:    ?Short of breath when lying flat:    ?Irregular heart rhythm:    ?    ?Vascular    ?Pain in calf, thigh, or hip brought on by ambulation: x   ?Pain in feet at night that wakes you up from your sleep:     ?Blood clot in  your veins:    ?Leg swelling:     ?    ?Pulmonary    ?Oxygen at home:    ?Productive cough:     ?Wheezing:     ?    ?Neurologic    ?Sudden weakness in arms or legs:     ?Sudden numbness in arms or legs:     ?Sudden onset of difficulty speaking or slurred speech:    ?Temporary loss of vision in one eye:     ?  Problems with dizziness:     ?    ?Gastrointestinal    ?Blood in stool:     ?Vomited blood:     ?    ?Genitourinary    ?Burning when urinating:     ?Blood in urine:    ?    ?Psychiatric    ?Major depression:     ?    ?Hematologic    ?Bleeding problems:    ?Problems with blood clotting too easily:    ?    ?Skin    ?Rashes or ulcers:    ?    ?Constitutional    ?Fever or chills:    ?- ? ?PHYSICAL EXAM:  ? ?Vitals:  ? 01/07/22 1320  ?BP: (!) 209/88  ?Pulse: 76  ?Resp: 20  ?Temp: 98 ?F (36.7 ?C)  ?SpO2: 96%  ?Weight: 199 lb (90.3 kg)  ?Height: 5' 9"  (1.753 m)  ? ?Body mass index is 29.39 kg/m?. ? ?GENERAL: The patient is a well-nourished male, in no acute distress. The vital signs are documented above. ?CARDIAC: There is a regular rate and rhythm.  ?VASCULAR: I do not detect carotid bruits. ?He has palpable femoral pulses. ?I cannot palpate pedal pulses. ?He has mild bilateral lower extremity swelling. ?PULMONARY: There is good air exchange bilaterally without wheezing or rales. ?ABDOMEN: Soft and non-tender with normal pitched bowel sounds.  I do not palpate an aneurysm ?MUSCULOSKELETAL: There are no major deformities. ?NEUROLOGIC: No focal weakness or paresthesias are detected. ?SKIN: There are no ulcers or rashes noted. ?PSYCHIATRIC: The patient has a normal affect. ? ?DATA:   ? ?ARTERIAL DOPPLER STUDY: I have independently interpreted his arterial Doppler study today. ? ?On the right side there is a dampened and monophasic dorsalis pedis and posterior tibial signal.  ABIs 46%.  Toe pressure 68 mmHg. ? ?On the left side there is a dampened monophasic dorsalis pedis and posterior tibial signal.  ABI 57%.  Toe  pressure is 46 mmHg. ? ?[name] ?[facility] ?

## 2022-01-08 ENCOUNTER — Other Ambulatory Visit: Payer: Self-pay | Admitting: Internal Medicine

## 2022-01-08 ENCOUNTER — Ambulatory Visit
Admission: RE | Admit: 2022-01-08 | Discharge: 2022-01-08 | Disposition: A | Discharge status: Home / Self Care | Payer: Medicare Other | Source: Ambulatory Visit | Attending: Internal Medicine | Admitting: Internal Medicine

## 2022-01-08 DIAGNOSIS — R079 Chest pain, unspecified: Secondary | ICD-10-CM

## 2022-01-26 ENCOUNTER — Emergency Department (HOSPITAL_COMMUNITY)
Admission: EM | Admit: 2022-01-26 | Discharge: 2022-01-26 | Disposition: A | Discharge status: Home / Self Care | Payer: Medicare Other | Attending: Emergency Medicine | Admitting: Emergency Medicine

## 2022-01-26 ENCOUNTER — Emergency Department (HOSPITAL_COMMUNITY): Payer: Medicare Other

## 2022-01-26 ENCOUNTER — Other Ambulatory Visit: Payer: Self-pay

## 2022-01-26 DIAGNOSIS — Z79899 Other long term (current) drug therapy: Secondary | ICD-10-CM | POA: Insufficient documentation

## 2022-01-26 DIAGNOSIS — I480 Paroxysmal atrial fibrillation: Secondary | ICD-10-CM | POA: Diagnosis not present

## 2022-01-26 DIAGNOSIS — Z955 Presence of coronary angioplasty implant and graft: Secondary | ICD-10-CM | POA: Diagnosis not present

## 2022-01-26 DIAGNOSIS — Z794 Long term (current) use of insulin: Secondary | ICD-10-CM | POA: Insufficient documentation

## 2022-01-26 DIAGNOSIS — E119 Type 2 diabetes mellitus without complications: Secondary | ICD-10-CM | POA: Diagnosis not present

## 2022-01-26 DIAGNOSIS — I2510 Atherosclerotic heart disease of native coronary artery without angina pectoris: Secondary | ICD-10-CM | POA: Insufficient documentation

## 2022-01-26 DIAGNOSIS — R109 Unspecified abdominal pain: Secondary | ICD-10-CM

## 2022-01-26 DIAGNOSIS — I10 Essential (primary) hypertension: Secondary | ICD-10-CM | POA: Diagnosis not present

## 2022-01-26 DIAGNOSIS — Z7984 Long term (current) use of oral hypoglycemic drugs: Secondary | ICD-10-CM | POA: Diagnosis not present

## 2022-01-26 LAB — URINALYSIS, ROUTINE W REFLEX MICROSCOPIC
Bacteria, UA: NONE SEEN
Bilirubin Urine: NEGATIVE
Glucose, UA: NEGATIVE mg/dL
Hgb urine dipstick: NEGATIVE
Ketones, ur: NEGATIVE mg/dL
Leukocytes,Ua: NEGATIVE
Nitrite: NEGATIVE
Protein, ur: 100 mg/dL — AB
Specific Gravity, Urine: 1.008 (ref 1.005–1.030)
pH: 5 (ref 5.0–8.0)

## 2022-01-26 LAB — COMPREHENSIVE METABOLIC PANEL
ALT: 24 U/L (ref 0–44)
AST: 22 U/L (ref 15–41)
Albumin: 3.8 g/dL (ref 3.5–5.0)
Alkaline Phosphatase: 46 U/L (ref 38–126)
Anion gap: 9 (ref 5–15)
BUN: 16 mg/dL (ref 8–23)
CO2: 21 mmol/L — ABNORMAL LOW (ref 22–32)
Calcium: 9.1 mg/dL (ref 8.9–10.3)
Chloride: 108 mmol/L (ref 98–111)
Creatinine, Ser: 1.68 mg/dL — ABNORMAL HIGH (ref 0.61–1.24)
GFR, Estimated: 40 mL/min — ABNORMAL LOW (ref >60)
Glucose, Bld: 192 mg/dL — ABNORMAL HIGH (ref 70–99)
Potassium: 3.9 mmol/L (ref 3.5–5.1)
Sodium: 138 mmol/L (ref 135–145)
Total Bilirubin: 0.5 mg/dL (ref 0.3–1.2)
Total Protein: 6.8 g/dL (ref 6.5–8.1)

## 2022-01-26 LAB — CBC WITH DIFFERENTIAL/PLATELET
Abs Immature Granulocytes: 0.02 10*3/uL (ref 0.00–0.07)
Basophils Absolute: 0 10*3/uL (ref 0.0–0.1)
Basophils Relative: 0 %
Eosinophils Absolute: 0.2 10*3/uL (ref 0.0–0.5)
Eosinophils Relative: 3 %
HCT: 36.5 % — ABNORMAL LOW (ref 39.0–52.0)
Hemoglobin: 12.9 g/dL — ABNORMAL LOW (ref 13.0–17.0)
Immature Granulocytes: 0 %
Lymphocytes Relative: 22 %
Lymphs Abs: 1.4 10*3/uL (ref 0.7–4.0)
MCH: 31.9 pg (ref 26.0–34.0)
MCHC: 35.3 g/dL (ref 30.0–36.0)
MCV: 90.3 fL (ref 80.0–100.0)
Monocytes Absolute: 0.3 10*3/uL (ref 0.1–1.0)
Monocytes Relative: 5 %
Neutro Abs: 4.5 10*3/uL (ref 1.7–7.7)
Neutrophils Relative %: 70 %
Platelets: 142 10*3/uL — ABNORMAL LOW (ref 150–400)
RBC: 4.04 MIL/uL — ABNORMAL LOW (ref 4.22–5.81)
RDW: 13.9 % (ref 11.5–15.5)
WBC: 6.4 10*3/uL (ref 4.0–10.5)
nRBC: 0 % (ref 0.0–0.2)

## 2022-01-26 LAB — LIPASE, BLOOD: Lipase: 31 U/L (ref 11–51)

## 2022-01-26 MED ORDER — IOHEXOL 350 MG/ML SOLN
80.0000 mL | Freq: Once | INTRAVENOUS | Status: AC | PRN
  Administered 2022-01-26: 80 mL via INTRAVENOUS

## 2022-01-26 MED ORDER — METOPROLOL SUCCINATE ER 25 MG PO TB24
100.0000 mg | ORAL_TABLET | Freq: Every day | ORAL | Status: DC
  Administered 2022-01-26: 100 mg via ORAL
  Filled 2022-01-26: qty 4

## 2022-01-26 MED ORDER — CYCLOBENZAPRINE HCL 10 MG PO TABS: 10.0000 mg | ORAL_TABLET | Freq: Two times a day (BID) | ORAL | 0 refills | Status: DC | PRN

## 2022-01-26 MED ORDER — ISOSORBIDE MONONITRATE ER 30 MG PO TB24
30.0000 mg | ORAL_TABLET | Freq: Every day | ORAL | Status: DC
  Administered 2022-01-26: 30 mg via ORAL
  Filled 2022-01-26: qty 1

## 2022-01-26 NOTE — ED Notes (Signed)
Pts wallet along with other valuables secured and locked up with security in belongings envelope number [phone]). ? ?Envelope includes: ?$882 (22- $1, 43-$20) ?2 [employer] Cards ?1 [facility] Card ?2 Military Cards ?[place] ?[place] ?1 Drivers License Card ?1 Pocket Knife ?1 Key band with 2 keys ? ?Belongings collected and gone through in the presences of Mahik F, Therapist, sports. ?

## 2022-01-26 NOTE — ED Triage Notes (Signed)
BIB [facility] from [facility] after pt called EMS r/t to increase right sided flank pain. Per EMS, pain has been ongoing x  1 week, pain worsening this AM. Pt was told to take tylenol earlier in the week. ? ? ?HX: DM, UTI ?

## 2022-01-26 NOTE — ED Provider Triage Note (Signed)
Emergency Medicine Provider Triage Evaluation Note ? ?Steve [name]. , a 81 y.o. male  was evaluated in triage.  [facility] from [facility] and complains of right flank pain x 1 week.  Has been taking tylenol with improvement but worsened again last night. ? ?Review of Systems  ?Positive: Flank pain ?Negative: fever ? ?Physical Exam  ?BP (!) 180/73 (BP Location: Left Arm)   Pulse 64   Temp 97.8 ?F (36.6 ?C) (Oral)   Resp 17   SpO2 100%  ? ?Gen:   Awake, no distress   ?Resp:  Normal effort  ?MSK:   Moves extremities without difficulty  ?Other:   ? ?Medical Decision Making  ?Medically screening exam initiated at 3:52 AM.  Appropriate orders placed.  Steve [name]. was informed that the remainder of the evaluation will be completed by another provider, this initial triage assessment does not replace that evaluation, and the importance of remaining in the ED until their evaluation is complete. ? ?Right flank pain for the past week.  Labs, UA, renal stone study.  ?  ?Larene Pickett, PA-C ?01/26/22 7373 ? ?

## 2022-01-26 NOTE — ED Provider Notes (Signed)
?[facility] ?Provider Note ? ? ?CSN: [id number] ?Arrival date & time: 01/26/22  0350 ? ?  ? ?History ? ?Chief Complaint  ?Patient presents with  ? Flank Pain  ?  Right sided   ? ? ?Steve [name]. is a 81 y.o. male. ? ?HPI ? ?  ? ?Hx of some right sided pain, 3/29 saw Dr about it, was mild, then last night had more severe pain, couldn't move, couldn't lay in bed, tried sitting in lift chair but didn't help ?Right side of back radiating to right groin ? ?No numbness/weakness ?Not really abdomen but side pain ?No urinary symptoms, no loss of control of bowel or bladder ?Had about 3-4 UTIs ?No chest pain, dyspnea, fevers ? ? ?Past Medical History:  ?Diagnosis Date  ? CAD (coronary artery disease)   ? a. s/p CABG x 29 Nov 2011 with LIMA to LAD, SVG to OM1 and distal LCX, SVG to ramus intermediate  ? Essential hypertension   ? Hyperlipidemia   ? Intermittent claudication (HCC)   ? Ischemic cardiomyopathy   ? a. 03/2015 Echo: EF45-50%, Gr1 DD, mild MR.  ? PAF (paroxysmal atrial fibrillation) ([place])   ? a. post op atrial fib 11/2011; short course of amiodarone; stopped 01/25/12  ? Peripheral arterial disease ([place])   ? Right bundle branch block   ? Type II diabetes mellitus ([place])   ?  ?Past Surgical History:  ?Procedure Laterality Date  ? CARDIAC CATHETERIZATION  01/16/1999  ? Est. EF of 65% -- Nonobstruction atherosclerotic coronary artery disease -- Normal left ventricular function      ? CARDIAC ELECTROPHYSIOLOGY STUDY AND ABLATION  05/21/1999  ? Normal sinus funtion -- Mildly prolonged interatrial conduction times -- Normal A-V node funtion -- Normal His Purkinje system function -- fNo accessory pathway -- No inducible ventricular tachycardia in the presence of the or in the absence of isoproterenol with programmed stimulation or with burst pacing -- Nikki Dom, M.D. FACC surgan          ? CHOLECYSTECTOMY  1999  ? CORONARY ARTERY BYPASS GRAFT  12/10/2011  ? Procedure: CORONARY  ARTERY BYPASS GRAFTING (CABG);  Surgeon: Tharon Aquas Adelene Idler, MD;  Location: [facility];  Service: Open Heart Surgery;  Laterality: N/A;  Coronary Artery bypass graft on pump times four utilizing left internal mammary artery and left saphenous vein harvested endoscopically  ? LEFT HEART CATH AND CORS/GRAFTS ANGIOGRAPHY N/A 12/18/2018  ? Procedure: LEFT HEART CATH AND CORS/GRAFTS ANGIOGRAPHY;  Surgeon: Belva Crome, MD;  Location: [facility];  Service: Cardiovascular;  Laterality: N/A;  ? LEFT HEART CATHETERIZATION WITH CORONARY ANGIOGRAM N/A 12/09/2011  ? Procedure: LEFT HEART CATHETERIZATION WITH CORONARY ANGIOGRAM;  Surgeon: Peter M Martinique, MD;  Location: [facility] CATH LAB;  Service: Cardiovascular;  Laterality: N/A;  ? LOWER EXTREMITY ANGIOGRAM Left 07/28/2018  ? Procedure: Lower Extremity Angiogram;  Surgeon: Angelia Mould, MD;  Location: [facility];  Service: Cardiovascular;  Laterality: Left;  ?  ? ?Home Medications ?Prior to Admission medications   ?Medication Sig Start Date End Date Taking? Authorizing Provider  ?Cholecalciferol (VITAMIN D3) 50 MCG (2000 UT) capsule Take 2,000 Units by mouth daily. 11/23/21  Yes [provider]  ?cyclobenzaprine (FLEXERIL) 10 MG tablet Take 1 tablet (10 mg total) by mouth 2 (two) times daily as needed for muscle spasms. 01/26/22  Yes Gareth Morgan, MD  ?doxazosin (CARDURA) 2 MG tablet Take 2 mg by mouth daily.  11/30/21  Yes [provider]  ?ELIQUIS 2.5 MG TABS tablet Take 2.5 mg by mouth 2 (two) times daily. 11/23/21  Yes [provider]  ?ezetimibe (ZETIA) 10 MG tablet Take 1 tablet (10 mg total) by mouth daily. 11/05/20  Yes Martinique, Peter M, MD  ?gabapentin (NEURONTIN) 100 MG capsule Take 1 capsule (100 mg total) by mouth at bedtime. 12/01/21  Yes Trula Slade, DPM  ?insulin detemir (LEVEMIR) 100 UNIT/ML injection Inject 0.18 mLs (18 Units total) into the skin daily. ?Patient taking differently: Inject 25 Units into the skin daily.  04/19/19  Yes Mikhail, Agenda, DO  ?isosorbide mononitrate (IMDUR) 30 MG 24 hr tablet TAKE 1 TABLET DAILY (NEED OFFICE VISIT FOR REFILLS - 1ST ATTEMPT) ?Patient taking differently: Take 30 mg by mouth daily. 12/24/21  Yes Almyra Deforest, PA  ?metFORMIN (GLUCOPHAGE) 1000 MG tablet Take 1,000 mg by mouth daily. 03/31/20  Yes [provider]  ?metoprolol succinate (TOPROL-XL) 100 MG 24 hr tablet Take 1 tablet (100 mg total) by mouth daily. Take with or immediately following a meal. ?Patient taking differently: Take 100 mg by mouth daily with supper. Take with or immediately following a meal. 12/19/18  Yes Cheryln Manly, NP  ?vitamin B-12 (CYANOCOBALAMIN) 500 MCG tablet Take 500 mcg by mouth daily with breakfast.    Yes [provider]  ?blood glucose meter kit and supplies Dispense based on patient and insurance preference. Use up to four times daily as directed. (FOR ICD-10 E10.9, E11.9). 04/18/19   Cristal Ford, DO  ?Insulin Syringe-Needle U-100 (INSULIN SYRINGE .3CC/31GX5/16") 31G X 5/16" 0.3 ML MISC Use with insulin 04/18/19   Mikhail, Velta Addison, DO  ?JANUVIA 50 MG tablet Take 50 mg by mouth daily. ?Patient not taking: Reported on 01/26/2022 12/23/21   [provider]  ?tamsulosin (FLOMAX) 0.4 MG CAPS capsule Take 0.4 mg by mouth daily after supper.  ?Patient not taking: Reported on 01/26/2022 11/01/18   [provider]  ?amLODipine (NORVASC) 10 MG tablet TAKE 1 TABLET DAILY ?Patient not taking: Reported on 05/20/2020 11/09/19 05/20/20  Lendon Colonel, NP  ?furosemide (LASIX) 40 MG tablet Take 1 tablet (40 mg total) by mouth daily. For 4 days then stop. 12/15/11 04/26/19  Nani Skillern, PA-C  ?insulin aspart (NOVOLOG) 100 UNIT/ML injection Inject 0-15 Units into the skin 3 (three) times daily with meals. CBG 121 - 150: 2 units;  ?CBG 151 - 200: 3 units;  ?CBG 201 - 250: 5 units;  ?CBG 251 - 300: 8 units; ?CBG 301 - 350: 11 units; ?CBG 351 - 400: 15 units;  ?CBG > 400 : 15 units and  notify MD ?Patient not taking: Reported on 05/20/2020 04/18/19 05/20/20  Cristal Ford, DO  ?   ? ?Allergies    ?Januvia [sitagliptin] and Statins   ? ?Review of Systems   ?Review of Systems ? ?Physical Exam ?Updated Vital Signs ?BP (!) 210/85   Pulse 66   Temp 97.8 ?F (36.6 ?C) (Oral)   Resp 17   SpO2 100%  ?Physical Exam ?Vitals and nursing note reviewed.  ?Constitutional:   ?   General: He is not in acute distress. ?   Appearance: He is well-developed. He is not diaphoretic.  ?HENT:  ?   Head: Normocephalic and atraumatic.  ?Eyes:  ?   Conjunctiva/sclera: Conjunctivae normal.  ?Cardiovascular:  ?   Rate and Rhythm: Normal rate and regular rhythm.  ?   Heart sounds: Normal heart sounds. No murmur heard. ?  No friction rub. No gallop.  ?Pulmonary:  ?   Effort: Pulmonary effort is normal. No respiratory distress.  ?   Breath sounds: Normal breath sounds. No wheezing or rales.  ?Abdominal:  ?   General: There is no distension.  ?   Palpations: Abdomen is soft.  ?   Tenderness: There is abdominal tenderness (reports pain after sitting up and now abdominal tenderness). There is right CVA tenderness. There is no guarding.  ?Musculoskeletal:  ?   Cervical back: Normal range of motion.  ?Skin: ?   General: Skin is warm and dry.  ?Neurological:  ?   Mental Status: He is alert and oriented to person, place, and time.  ? ? ?ED Results / Procedures / Treatments   ?Labs ?(all labs ordered are listed, but only abnormal results are displayed) ?Labs Reviewed  ?CBC WITH DIFFERENTIAL/PLATELET - Abnormal; Notable for the following components:  ?    Result Value  ? RBC 4.04 (*)   ? Hemoglobin 12.9 (*)   ? HCT 36.5 (*)   ? Platelets 142 (*)   ? All other components within normal limits  ?COMPREHENSIVE METABOLIC PANEL - Abnormal; Notable for the following components:  ? CO2 21 (*)   ? Glucose, Bld 192 (*)   ? Creatinine, Ser 1.68 (*)   ? GFR, Estimated 40 (*)   ? All other components within normal limits  ?URINALYSIS, ROUTINE W  REFLEX MICROSCOPIC - Abnormal; Notable for the following components:  ? Protein, ur 100 (*)   ? All other components within normal limits  ?LIPASE, BLOOD  ? ? ?EKG ?None ? ?Radiology ?CT Renal Stone Study ? ?[facility]

## 2022-02-02 ENCOUNTER — Ambulatory Visit (INDEPENDENT_AMBULATORY_CARE_PROVIDER_SITE_OTHER): Payer: Medicare Other | Admitting: Podiatry

## 2022-02-02 DIAGNOSIS — L84 Corns and callosities: Secondary | ICD-10-CM

## 2022-02-02 DIAGNOSIS — M79674 Pain in right toe(s): Secondary | ICD-10-CM | POA: Diagnosis not present

## 2022-02-02 DIAGNOSIS — Z7901 Long term (current) use of anticoagulants: Secondary | ICD-10-CM | POA: Diagnosis not present

## 2022-02-02 DIAGNOSIS — B351 Tinea unguium: Secondary | ICD-10-CM

## 2022-02-02 DIAGNOSIS — I739 Peripheral vascular disease, unspecified: Secondary | ICD-10-CM

## 2022-02-02 DIAGNOSIS — M79675 Pain in left toe(s): Secondary | ICD-10-CM

## 2022-02-02 DIAGNOSIS — E1149 Type 2 diabetes mellitus with other diabetic neurological complication: Secondary | ICD-10-CM

## 2022-02-02 NOTE — Progress Notes (Signed)
Subjective: ?81 year old male presents the office today for concerns of thick elongated toenails that he cannot trim himself as well as for painful callus on his left foot submetatarsal 5.  Denies any opening, swelling or redness or any drainage.  He states that otherwise his feet been feeling good.  He has not been taking the gabapentin but overall he does feel his feet are improving and he states that they are "not bad".  ? ?He is on Eliquis. ? ?Objective: ?AAO x3, NAD ?DP/PT pulses 1/4 bilaterally, CRT less than 3 seconds ?Sensation decreased with Semmes Weinstein monofilament ?Nails are hypertrophic, dystrophic, brittle, discolored, elongated ?10. No surrounding redness or drainage. Tenderness nails 1-5 bilaterally. No open lesions or pre-ulcerative lesions are identified today. ?Thick hyperkeratotic tissue with some dried blood present underneath the callus left foot submetatarsal 5.  Upon debridement there is no underlying ulceration drainage or any signs of infection noted today.  There is prominence of metatarsal head plantarly. ?No pain with calf compression, swelling, warmth, erythema ? ?Assessment: ?81 year old male with symptomatic onychomycosis, preulcerative calluses; neuropathy; PAD ? ?Plan: ?-All treatment options discussed with the patient including all alternatives, risks, complications.  ?-Sharply debrided the nails x10 without any complications or bleeding ?-Sharply debrided hyperkeratotic lesion x1 without any complications or bleeding.  Continue offloading. ?-We will monitor neuropathy.  Wants to off on further medication at this time for this. ?-Discussed daily foot inspection, glucose control. ?-Patient encouraged to call the office with any questions, concerns, change in symptoms.  ? ?Trula Slade DPM ? ?

## 2022-03-05 ENCOUNTER — Other Ambulatory Visit: Payer: Self-pay | Admitting: Physician Assistant

## 2022-04-02 ENCOUNTER — Ambulatory Visit (INDEPENDENT_AMBULATORY_CARE_PROVIDER_SITE_OTHER): Payer: Medicare Other | Admitting: Podiatry

## 2022-04-02 ENCOUNTER — Ambulatory Visit: Payer: Medicare Other

## 2022-04-02 DIAGNOSIS — L84 Corns and callosities: Secondary | ICD-10-CM

## 2022-04-02 DIAGNOSIS — E1149 Type 2 diabetes mellitus with other diabetic neurological complication: Secondary | ICD-10-CM | POA: Diagnosis not present

## 2022-04-02 DIAGNOSIS — L97522 Non-pressure chronic ulcer of other part of left foot with fat layer exposed: Secondary | ICD-10-CM | POA: Diagnosis not present

## 2022-04-02 DIAGNOSIS — I739 Peripheral vascular disease, unspecified: Secondary | ICD-10-CM

## 2022-04-02 MED ORDER — SILVER SULFADIAZINE 1 % EX CREA: 1.0000 "application " | TOPICAL_CREAM | Freq: Every day | CUTANEOUS | 0 refills | Status: DC

## 2022-04-02 MED ORDER — DOXYCYCLINE HYCLATE 100 MG PO TABS: 100.0000 mg | ORAL_TABLET | Freq: Two times a day (BID) | ORAL | 0 refills | Status: DC

## 2022-04-04 NOTE — Progress Notes (Signed)
Subjective: 81 year old male presents the office today for new issue.  He now has a wound on the left foot that his primary care doctor noticed.  He is not sure, this has been ongoing for I last saw him in April which was not present at that time.  He previously had a callus to the area.  Denies any drainage or pus.  No swelling or redness.  No fevers or chills.   Objective: AAO x3, NAD DP/PT pulses decreased bilaterally Sensation decreased with Semmes Weinstein monofilament. Granular wound present left foot submetatarsal 5.  See picture below.  Measures 0.5 x 0.5 x 0.3 cm with minimal edema.  No significant cellulitis noted.  No fluctuation or crepitation.  No malodor.  No probing to bone, undermining or tunneling. No pain with calf compression, swelling, warmth, erythema     Assessment: Ulceration left foot  Plan: -All treatment options discussed with the patient including all alternatives, risks, complications.  -X-rays were obtained and reviewed of the left foot.  No definitive evidence of acute fracture, cortical destruction suggestive of osteomyelitis.  No soft tissue emphysema. -Clean the wound today.  Silvadene was applied followed by dressing. -Prescribed doxycycline. -Silvadene dressing changes daily. -Offloading at all times.  Dispensed offloading pads to help facilitate this. -Previously he has seen vascular surgery.  He was last seen in March.  -Patient encouraged to call the office with any questions, concerns, change in symptoms.   Trula Slade DPM Cc: Dr. [name]

## 2022-04-12 ENCOUNTER — Other Ambulatory Visit: Payer: Medicare Other

## 2022-04-14 ENCOUNTER — Other Ambulatory Visit: Payer: Self-pay | Admitting: Internal Medicine

## 2022-04-20 ENCOUNTER — Encounter (HOSPITAL_BASED_OUTPATIENT_CLINIC_OR_DEPARTMENT_OTHER): Payer: Medicare Other | Attending: General Surgery | Admitting: General Surgery

## 2022-04-20 DIAGNOSIS — E1165 Type 2 diabetes mellitus with hyperglycemia: Secondary | ICD-10-CM | POA: Insufficient documentation

## 2022-04-20 DIAGNOSIS — I129 Hypertensive chronic kidney disease with stage 1 through stage 4 chronic kidney disease, or unspecified chronic kidney disease: Secondary | ICD-10-CM | POA: Insufficient documentation

## 2022-04-20 DIAGNOSIS — L97522 Non-pressure chronic ulcer of other part of left foot with fat layer exposed: Secondary | ICD-10-CM | POA: Insufficient documentation

## 2022-04-20 DIAGNOSIS — E1151 Type 2 diabetes mellitus with diabetic peripheral angiopathy without gangrene: Secondary | ICD-10-CM | POA: Insufficient documentation

## 2022-04-20 DIAGNOSIS — E11621 Type 2 diabetes mellitus with foot ulcer: Secondary | ICD-10-CM | POA: Diagnosis present

## 2022-04-20 DIAGNOSIS — E1122 Type 2 diabetes mellitus with diabetic chronic kidney disease: Secondary | ICD-10-CM | POA: Insufficient documentation

## 2022-04-20 DIAGNOSIS — I2510 Atherosclerotic heart disease of native coronary artery without angina pectoris: Secondary | ICD-10-CM | POA: Insufficient documentation

## 2022-04-20 DIAGNOSIS — N184 Chronic kidney disease, stage 4 (severe): Secondary | ICD-10-CM | POA: Insufficient documentation

## 2022-04-28 ENCOUNTER — Encounter (HOSPITAL_BASED_OUTPATIENT_CLINIC_OR_DEPARTMENT_OTHER): Payer: Medicare Other | Attending: General Surgery | Admitting: General Surgery

## 2022-04-28 DIAGNOSIS — L84 Corns and callosities: Secondary | ICD-10-CM | POA: Diagnosis not present

## 2022-04-28 DIAGNOSIS — E11621 Type 2 diabetes mellitus with foot ulcer: Secondary | ICD-10-CM | POA: Insufficient documentation

## 2022-04-28 DIAGNOSIS — I129 Hypertensive chronic kidney disease with stage 1 through stage 4 chronic kidney disease, or unspecified chronic kidney disease: Secondary | ICD-10-CM | POA: Insufficient documentation

## 2022-04-28 DIAGNOSIS — I2510 Atherosclerotic heart disease of native coronary artery without angina pectoris: Secondary | ICD-10-CM | POA: Insufficient documentation

## 2022-04-28 DIAGNOSIS — L97522 Non-pressure chronic ulcer of other part of left foot with fat layer exposed: Secondary | ICD-10-CM | POA: Insufficient documentation

## 2022-04-28 DIAGNOSIS — N184 Chronic kidney disease, stage 4 (severe): Secondary | ICD-10-CM | POA: Diagnosis not present

## 2022-04-28 DIAGNOSIS — E1122 Type 2 diabetes mellitus with diabetic chronic kidney disease: Secondary | ICD-10-CM | POA: Insufficient documentation

## 2022-04-28 DIAGNOSIS — E1151 Type 2 diabetes mellitus with diabetic peripheral angiopathy without gangrene: Secondary | ICD-10-CM | POA: Diagnosis not present

## 2022-04-28 NOTE — Progress Notes (Signed)
HUBERT, RAATZ ([id number]) Visit Report for 04/28/2022 Arrival Information Details Patient Name: Date of Service: Steve NDArnaldo, Steve [name] 04/28/2022 12:30 PM Medical Record Number: [id number] Patient Account Number: [id number] Date of Birth/Sex: Treating RN: 1941-07-30 (81 y.o. Steve [name] Primary Care Argelia Formisano: Maudry Mayhew Other Clinician: Referring Joniya Boberg: Treating Alferd Obryant/Extender: Horris Latino in Treatment: 1 Visit Information History Since Last Visit Added or deleted any medications: No Patient Arrived: Steve [name] Any new allergies or adverse reactions: No Arrival Time: 12:49 Had a fall or experienced change in No Accompanied By: self activities of daily living that may affect Transfer Assistance: None risk of falls: Patient Identification Verified: Yes Signs or symptoms of abuse/neglect since last visito No Secondary Verification Process Completed: Yes Hospitalized since last visit: No Patient Has Alerts: Yes Implantable device outside of the clinic excluding No Patient Alerts: Patient on Blood Thinner cellular tissue based products placed in the center R ABI = N/C TBI = .30 since last visit: L ABI = .57 TBI = .20 Has Dressing in Place as Prescribed: Yes Pain Present Now: No Electronic Signature(s) Signed: 04/28/2022 5:32:47 PM By: Baruch Gouty RN, BSN Entered By: Baruch Gouty on 04/28/2022 12:50:23 -------------------------------------------------------------------------------- Encounter Discharge Information Details Patient Name: Date of Service: Steve [name], [name] 04/28/2022 12:30 PM Medical Record Number: [id number] Patient Account Number: [id number] Date of Birth/Sex: Treating RN: [birth year] (81 y.o. Steve [name] Primary Care Emberlee Sortino: Maudry Mayhew Other Clinician: Referring Clarice Bonaventure: Treating Jhanvi Drakeford/Extender: Horris Latino in Treatment: 1 Encounter Discharge Information Items Post Procedure  Vitals Discharge Condition: Stable Temperature (F): 97.9 Ambulatory Status: Walker Pulse (bpm): 72 Discharge Destination: Home Respiratory Rate (breaths/min): 18 Transportation: Private Auto Blood Pressure (mmHg): 157/70 Accompanied By: self Schedule Follow-up Appointment: Yes Clinical Summary of Care: Patient Declined Electronic Signature(s) Signed: 04/28/2022 5:32:47 PM By: Baruch Gouty RN, BSN Entered By: Baruch Gouty on 04/28/2022 13:20:55 -------------------------------------------------------------------------------- Lower Extremity Assessment Details Patient Name: Date of Service: Steve [name], [name] 04/28/2022 12:30 PM Medical Record Number: [id number] Patient Account Number: [id number] Date of Birth/Sex: Treating RN: [birth year] (81 y.o. Steve [name] Primary Care Lamarius Dirr: Maudry Mayhew Other Clinician: Referring Marieclaire Bettenhausen: Treating Lakeysha Slutsky/Extender: Horris Latino in Treatment: 1 Edema Assessment Assessed: [Left: No] [Right: No] Edema: [Left: N] [Right: o] Calf Left: Right: Point of Measurement: From Medial Instep 34.5 cm Ankle Left: Right: Point of Measurement: From Medial Instep 22.5 cm Vascular Assessment Pulses: Dorsalis Pedis Palpable: [Left:No] Electronic Signature(s) Signed: 04/28/2022 5:32:47 PM By: Baruch Gouty RN, BSN Entered By: Baruch Gouty on 04/28/2022 12:53:34 -------------------------------------------------------------------------------- Multi Wound Chart Details Patient Name: Date of Service: Steve [name], [name] 04/28/2022 12:30 PM Medical Record Number: [id number] Patient Account Number: [id number] Date of Birth/Sex: Treating RN: 1940-12-30 (81 y.o. Steve [name] Primary Care Keirah Konitzer: Maudry Mayhew Other Clinician: Referring Mayari Matus: Treating Gerrit Rafalski/Extender: Horris Latino in Treatment: 1 Vital Signs Height(in): 2 Capillary Blood Glucose(mg/dl): 198 Weight(lbs):  195 Pulse(bpm): 65 Body Mass Index(BMI): 28.8 Blood Pressure(mmHg): 157/70 Temperature(F): 97.9 Respiratory Rate(breaths/min): 18 Photos: [1:Left Metatarsal head fifth] [N/A:N/A N/A] Wound Location: [1:Gradually Appeared] [N/A:N/A] Wounding Event: [1:Diabetic Wound/Ulcer of the Lower] [N/A:N/A] Primary Etiology: [1:Extremity Cataracts, Angina, Coronary Artery N/A] Comorbid History: [1:Disease, Hypertension, Peripheral Arterial Disease, Type II Diabetes, Osteoarthritis, Neuropathy 03/25/2022] [N/A:N/A] Date Acquired: [1:1] [N/A:N/A] Weeks of Treatment: [1:Open] [N/A:N/A] Wound Status: [1:No] [N/A:N/A] Wound Recurrence: [1:0.7x0.7x0.3] [N/A:N/A] Measurements L x W x D (cm) [1:0.385] [N/A:N/A] A (cm) : rea [1:0.115] [N/A:N/A] Volume (cm) : [1:-442.30%] [N/A:N/A] % Reduction  in A [1:rea: -310.70%] [N/A:N/A] % Reduction in Volume: [1:6] Starting Position 1 (o'clock): [1:12] Ending Position 1 (o'clock): [1:0.2] Maximum Distance 1 (cm): [1:Yes] [N/A:N/A] Undermining: [1:Grade 2] [N/A:N/A] Classification: [1:Medium] [N/A:N/A] Exudate A mount: [1:Serous] [N/A:N/A] Exudate Type: [1:amber] [N/A:N/A] Exudate Color: [1:Well defined, not attached] [N/A:N/A] Wound Margin: [1:Large (67-100%)] [N/A:N/A] Granulation A mount: [1:Red] [N/A:N/A] Granulation Quality: [1:None Present (0%)] [N/A:N/A] Necrotic A mount: [1:Fat Layer (Subcutaneous Tissue): Yes N/A] Exposed Structures: [1:Fascia: No Tendon: No Muscle: No Joint: No Bone: No None] [N/A:N/A] Epithelialization: [1:Debridement - Selective/Open Wound N/A] Debridement: Pre-procedure Verification/Time Out 13:05 [N/A:N/A] Taken: [1:Lidocaine 4% T opical Solution] [N/A:N/A] Pain Control: [1:Callus, Slough] [N/A:N/A] Tissue Debrided: [1:Skin/Epidermis] [N/A:N/A] Level: [1:2.25] [N/A:N/A] Debridement A (sq cm): [1:rea Curette] [N/A:N/A] Instrument: [1:Minimum] [N/A:N/A] Bleeding: [1:Pressure] [N/A:N/A] Hemostasis A chieved: [1:0]  [N/A:N/A] Procedural Pain: [1:0] [N/A:N/A] Post Procedural Pain: [1:Procedure was tolerated well] [N/A:N/A] Debridement Treatment Response: [1:0.7x0.7x0.2] [N/A:N/A] Post Debridement Measurements L x W x D (cm) [1:0.077] [N/A:N/A] Post Debridement Volume: (cm) [1:macerated wound edges] [N/A:N/A] Assessment Notes: [1:Debridement] [N/A:N/A] Treatment Notes Electronic Signature(s) Signed: 04/28/2022 1:17:02 PM By: Fredirick Maudlin MD FACS Signed: 04/28/2022 5:32:47 PM By: Baruch Gouty RN, BSN Entered By: Fredirick Maudlin on 04/28/2022 13:17:02 -------------------------------------------------------------------------------- Multi-Disciplinary Care Plan Details Patient Name: Date of Service: Steve [name], [name] 04/28/2022 12:30 PM Medical Record Number: [id number] Patient Account Number: [id number] Date of Birth/Sex: Treating RN: Mar 27, 1941 (81 y.o. Steve [name] Primary Care Maxene Byington: Maudry Mayhew Other Clinician: Referring Trigo Winterbottom: Treating Dhani Imel/Extender: Horris Latino in Treatment: 1 Multidisciplinary Care Plan reviewed with physician Active Inactive Nutrition Nursing Diagnoses: Impaired glucose control: actual or potential Potential for alteratiion in Nutrition/Potential for imbalanced nutrition Goals: Patient/caregiver will maintain therapeutic glucose control Date Initiated: 04/20/2022 Target Resolution Date: 05/18/2022 Goal Status: Active Interventions: Assess HgA1c results as ordered upon admission and as needed Assess patient nutrition upon admission and as needed per policy Treatment Activities: Giving encouragement to exercise : 04/20/2022 Patient referred to Primary Care Physician for further nutritional evaluation : 04/20/2022 Notes: Wound/Skin Impairment Nursing Diagnoses: Impaired tissue integrity Knowledge deficit related to ulceration/compromised skin integrity Goals: Patient/caregiver will verbalize understanding of skin care  regimen Date Initiated: 04/20/2022 Target Resolution Date: 05/18/2022 Goal Status: Active Ulcer/skin breakdown will have a volume reduction of 30% by week 4 Date Initiated: 04/20/2022 Target Resolution Date: 05/18/2022 Goal Status: Active Interventions: Assess patient/caregiver ability to obtain necessary supplies Assess patient/caregiver ability to perform ulcer/skin care regimen upon admission and as needed Assess ulceration(s) every visit Provide education on ulcer and skin care Treatment Activities: Skin care regimen initiated : 04/20/2022 Topical wound management initiated : 04/20/2022 Notes: Electronic Signature(s) Signed: 04/28/2022 5:32:47 PM By: Baruch Gouty RN, BSN Entered By: Baruch Gouty on 04/28/2022 13:02:44 -------------------------------------------------------------------------------- Pain Assessment Details Patient Name: Date of Service: Steve [name], [name] 04/28/2022 12:30 PM Medical Record Number: [id number] Patient Account Number: [id number] Date of Birth/Sex: Treating RN: 10-21-1941 (81 y.o. Steve [name] Primary Care Yusif Gnau: Maudry Mayhew Other Clinician: Referring Monteen Toops: Treating Jull Harral/Extender: Horris Latino in Treatment: 1 Active Problems Location of Pain Severity and Description of Pain Patient Has Paino No Patient Has Paino No Site Locations Rate the pain. Current Pain Level: 0 Pain Management and Medication Current Pain Management: Electronic Signature(s) Signed: 04/28/2022 5:32:47 PM By: Baruch Gouty RN, BSN Entered By: Baruch Gouty on 04/28/2022 12:51:54 -------------------------------------------------------------------------------- Patient/Caregiver Education Details Patient Name: Date of Service: Steve [name], Steve [name]. 7/5/2023andnbsp12:30 PM Medical Record Number: [id number] Patient Account Number: [id number] Date of Birth/Gender: Treating  RN: Jul 27, 1941 (81 y.o. Steve [name] Primary Care  Physician: Maudry Mayhew Other Clinician: Referring Physician: Treating Physician/Extender: Horris Latino in Treatment: 1 Education Assessment Education Provided To: Patient Education Topics Provided Offloading: Methods: Explain/Verbal Responses: Reinforcements needed, State content correctly Wound/Skin Impairment: Methods: Explain/Verbal Responses: State content correctly Electronic Signature(s) Signed: 04/28/2022 5:32:47 PM By: Baruch Gouty RN, BSN Entered By: Baruch Gouty on 04/28/2022 13:03:14 -------------------------------------------------------------------------------- Wound Assessment Details Patient Name: Date of Service: Steve [name], [name] 04/28/2022 12:30 PM Medical Record Number: [id number] Patient Account Number: [id number] Date of Birth/Sex: Treating RN: 11/20/1940 (81 y.o. Steve [name] Primary Care Kyston Gonce: Maudry Mayhew Other Clinician: Referring Khloei Spiker: Treating Arleigh Odowd/Extender: Horris Latino in Treatment: 1 Wound Status Wound Number: 1 Primary Diabetic Wound/Ulcer of the Lower Extremity Etiology: Wound Location: Left Metatarsal head fifth Wound Open Wounding Event: Gradually Appeared Status: Date Acquired: 03/25/2022 Comorbid Cataracts, Angina, Coronary Artery Disease, Hypertension, Weeks Of Treatment: 1 History: Peripheral Arterial Disease, Type II Diabetes, Osteoarthritis, Clustered Wound: No Neuropathy Photos Wound Measurements Length: (cm) 0.7 Width: (cm) 0.7 Depth: (cm) 0.3 Area: (cm) 0.385 Volume: (cm) 0.115 % Reduction in Area: -442.3% % Reduction in Volume: -310.7% Epithelialization: None Tunneling: No Undermining: Yes Starting Position (o'clock): 6 Ending Position (o'clock): 12 Maximum Distance: (cm) 0.2 Wound Description Classification: Grade 2 Wound Margin: Well defined, not attached Exudate Amount: Medium Exudate Type: Serous Exudate Color: amber Foul Odor  After Cleansing: No Slough/Fibrino No Wound Bed Granulation Amount: Large (67-100%) Exposed Structure Granulation Quality: Red Fascia Exposed: No Necrotic Amount: None Present (0%) Fat Layer (Subcutaneous Tissue) Exposed: Yes Tendon Exposed: No Muscle Exposed: No Joint Exposed: No Bone Exposed: No Assessment Notes macerated wound edges Treatment Notes Wound #1 (Metatarsal head fifth) Wound Laterality: Left Cleanser Peri-Wound Care Topical Primary Dressing KerraCel Ag Gelling Fiber Dressing, 2x2 in (silver alginate) Discharge Instruction: Apply silver alginate to wound bed as instructed Secondary Dressing Optifoam Non-Adhesive Dressing, 4x4 in Discharge Instruction: Apply over primary dressing cut to make foam donut Woven Gauze Sponges 2x2 in Discharge Instruction: Apply over primary dressing as directed. Secured With 38M Medipore H Soft Cloth Surgical T ape, 4 x 10 (in/yd) Discharge Instruction: Secure with tape as directed. Compression Wrap Compression Stockings Add-Ons Electronic Signature(s) Signed: 04/28/2022 5:32:47 PM By: Baruch Gouty RN, BSN Entered By: Baruch Gouty on 04/28/2022 13:01:18 -------------------------------------------------------------------------------- Vitals Details Patient Name: Date of Service: Steve [name], [name] 04/28/2022 12:30 PM Medical Record Number: [id number] Patient Account Number: [id number] Date of Birth/Sex: Treating RN: 01/16/1941 (81 y.o. Steve [name] Primary Care Broderic Bara: Maudry Mayhew Other Clinician: Referring Garielle Mroz: Treating Takara Sermons/Extender: Horris Latino in Treatment: 1 Vital Signs Time Taken: 12:50 Temperature (F): 97.9 Height (in): 69 Pulse (bpm): 72 Weight (lbs): 195 Respiratory Rate (breaths/min): 18 Body Mass Index (BMI): 28.8 Blood Pressure (mmHg): 157/70 Capillary Blood Glucose (mg/dl): 198 Reference Range: 80 - 120 mg / dl Notes glucose per pt report this  am Electronic Signature(s) Signed: 04/28/2022 5:32:47 PM By: Baruch Gouty RN, BSN Entered By: Baruch Gouty on 04/28/2022 12:51:41

## 2022-04-28 NOTE — Progress Notes (Signed)
Steve [name], Steve [name] ([id number]) Visit Report for 04/28/2022 Chief Complaint Document Details Patient Name: Date of Service: BO NDJamarquis, Crull 04/28/2022 12:30 PM Medical Record Number: [id number] Patient Account Number: [id number] Date of Birth/Sex: Treating RN: 21-Aug-1941 (81 y.o. Ernestene Mention Primary Care Provider: Maudry Mayhew Other Clinician: Referring Provider: Treating Provider/Extender: Horris Latino in Treatment: 1 Information Obtained from: Patient Chief Complaint Patients presents for treatment of an open diabetic ulcer Electronic Signature(s) Signed: 04/28/2022 1:17:07 PM By: Fredirick Maudlin MD FACS Entered By: Fredirick Maudlin on 04/28/2022 13:17:06 -------------------------------------------------------------------------------- Debridement Details Patient Name: Date of Service: [name], Awab C. 04/28/2022 12:30 PM Medical Record Number: [id number] Patient Account Number: [id number] Date of Birth/Sex: Treating RN: Feb 23, 1941 (81 y.o. Ernestene Mention Primary Care Provider: Maudry Mayhew Other Clinician: Referring Provider: Treating Provider/Extender: Horris Latino in Treatment: 1 Debridement Performed for Assessment: Wound #1 Left Metatarsal head fifth Performed By: Physician Fredirick Maudlin, MD Debridement Type: Debridement Severity of Tissue Pre Debridement: Fat layer exposed Level of Consciousness (Pre-procedure): Awake and Alert Pre-procedure Verification/Time Out Yes - 13:05 Taken: Start Time: 13:05 Pain Control: Lidocaine 4% T opical Solution T Area Debrided (L x W): otal 1.5 (cm) x 1.5 (cm) = 2.25 (cm) Tissue and other material debrided: Callus, Slough, Skin: Epidermis, Slough Level: Skin/Epidermis Debridement Description: Selective/Open Wound Instrument: Curette Bleeding: Minimum Hemostasis Achieved: Pressure Procedural Pain: 0 Post Procedural Pain: 0 Response to Treatment: Procedure was tolerated  well Level of Consciousness (Post- Awake and Alert procedure): Post Debridement Measurements of Total Wound Length: (cm) 0.7 Width: (cm) 0.7 Depth: (cm) 0.2 Volume: (cm) 0.077 Character of Wound/Ulcer Post Debridement: Improved Severity of Tissue Post Debridement: Fat layer exposed Post Procedure Diagnosis Same as Pre-procedure Electronic Signature(s) Signed: 04/28/2022 1:19:52 PM By: Fredirick Maudlin MD FACS Signed: 04/28/2022 5:32:47 PM By: Baruch Gouty RN, BSN Entered By: Baruch Gouty on 04/28/2022 13:09:14 -------------------------------------------------------------------------------- HPI Details Patient Name: Date of Service: [name], Dhruv C. 04/28/2022 12:30 PM Medical Record Number: [id number] Patient Account Number: [id number] Date of Birth/Sex: Treating RN: 09-Sep-1941 (81 y.o. Ernestene Mention Primary Care Provider: Maudry Mayhew Other Clinician: Referring Provider: Treating Provider/Extender: Horris Latino in Treatment: 1 History of Present Illness HPI Description: ADMISSION 04/20/2022 This is an 81 year old man with a past medical history significant for poorly controlled type 2 diabetes mellitus, coronary artery disease, peripheral vascular disease, stage IV chronic kidney disease, and hypertension. He has an open ulcer on the plantar surface of his left fifth metatarsal head. It apparently just gradually appeared in the site where he had a callus. Last hemoglobin A1c that I was able to find in the electronic medical record was from 2020 and was 10.8. Formal vascular studies were done in March 2023 and the results are copied here: +-------+-----------+-----------+------------+------------+ ABI/TBIT oday's ABIT oday's TBIPrevious ABIPrevious TBI +-------+-----------+-----------+------------+------------+ Right [place] 0.30 [place] 0.46  +-------+-----------+-----------+------------+------------+ Left 0.57 0.20 0.66 0.23   +-------+-----------+-----------+------------+------------+ Summary: Right: Resting right ankle-brachial index indicates noncompressible right lower extremity arteries. The right toe-brachial index is abnormal. Left: Resting left ankle-brachial index indicates moderate left lower extremity arterial disease; however, indices may be overestimated due to the presence of calcified vessels. The toe-index is abnormal. The wound is fairly small, circular, and has a clean base. There is some periwound callus around the periphery. No significant undermining. Light layer of slough/biofilm on the surface. 04/28/2022: The wound measured a little bit larger today but this might be secondary to the debridement that I performed last week.  The callus around the wound is macerated and starting to fall off of its own accord. The base of the wound itself is fairly clean with just a small amount of slough. No erythema, induration, or purulent drainage. Electronic Signature(s) Signed: 04/28/2022 1:17:44 PM By: Fredirick Maudlin MD FACS Entered By: Fredirick Maudlin on 04/28/2022 13:17:43 -------------------------------------------------------------------------------- Physical Exam Details Patient Name: Date of Service: [name], Shuaib C. 04/28/2022 12:30 PM Medical Record Number: [id number] Patient Account Number: [id number] Date of Birth/Sex: Treating RN: April 03, 1941 (81 y.o. Ernestene Mention Primary Care Provider: Maudry Mayhew Other Clinician: Referring Provider: Treating Provider/Extender: Horris Latino in Treatment: 1 Constitutional Hypertensive, asymptomatic. . . . No acute distress.Marland Kitchen Respiratory Normal work of breathing on room air.. Notes 04/28/2022: The wound measured a little bit larger today but this might be secondary to the debridement that I performed last week. The callus around the wound is macerated and starting to fall off of its own accord. The base of the wound  itself is fairly clean with just a small amount of slough. No erythema, induration, or purulent drainage. Electronic Signature(s) Signed: 04/28/2022 1:18:19 PM By: Fredirick Maudlin MD FACS Entered By: Fredirick Maudlin on 04/28/2022 13:18:19 -------------------------------------------------------------------------------- Physician Orders Details Patient Name: Date of Service: [name], Raney C. 04/28/2022 12:30 PM Medical Record Number: [id number] Patient Account Number: [id number] Date of Birth/Sex: Treating RN: 11-21-1940 (81 y.o. Ernestene Mention Primary Care Provider: Maudry Mayhew Other Clinician: Referring Provider: Treating Provider/Extender: Horris Latino in Treatment: 1 Verbal / Phone Orders: No Diagnosis Coding ICD-10 Coding Code Description [phone] Non-pressure chronic ulcer of other part of left foot with fat layer exposed E11.621 Type 2 diabetes mellitus with foot ulcer I73.9 Peripheral vascular disease, unspecified N18.4 Chronic kidney disease, stage 4 (severe) I25.10 Atherosclerotic heart disease of native coronary artery without angina pectoris I10 Essential (primary) hypertension Follow-up Appointments ppointment in 1 week. - Dr. Celine Ahr RM 1 with [facility] Return A Thursday 7/13 @ 12:30 pm Bathing/ Shower/ Hygiene May shower and wash wound with soap and water. - with dressing changes Home Health Dressing changes to be completed by [place] on Monday / Wednesday / Friday except when patient has scheduled visit at [facility]. Other Home Health Orders/Instructions: - [facility] Wound Treatment Wound #1 - Metatarsal head fifth Wound Laterality: Left Prim Dressing: KerraCel Ag Gelling Fiber Dressing, 2x2 in (silver alginate) 3 x Per Week/30 Days ary Discharge Instructions: Apply silver alginate to wound bed as instructed Secondary Dressing: Optifoam Non-Adhesive Dressing, 4x4 in 3 x Per Week/30 Days Discharge Instructions: Apply over  primary dressing cut to make foam donut Secondary Dressing: Woven Gauze Sponges 2x2 in 3 x Per Week/30 Days Discharge Instructions: Apply over primary dressing as directed. Secured With: 48M Medipore H Soft Cloth Surgical T ape, 4 x 10 (in/yd) 3 x Per Week/30 Days Discharge Instructions: Secure with tape as directed. Patient Medications llergies: Januvia, Statins-HMG-CoA Reductase Inhibitors A Notifications Medication Indication Start End prior to debridement 04/28/2022 lidocaine DOSE topical 4 % cream - cream topical Electronic Signature(s) Signed: 04/28/2022 1:18:29 PM By: Fredirick Maudlin MD FACS Entered By: Fredirick Maudlin on 04/28/2022 13:18:29 -------------------------------------------------------------------------------- Problem List Details Patient Name: Date of Service: [name], Jaecob C. 04/28/2022 12:30 PM Medical Record Number: [id number] Patient Account Number: [id number] Date of Birth/Sex: Treating RN: 07-28-1941 (81 y.o. Ernestene Mention Primary Care Provider: Maudry Mayhew Other Clinician: Referring Provider: Treating Provider/Extender: Horris Latino in Treatment: 1 Active Problems ICD-10 Encounter Code  Description Active Date MDM Diagnosis L97.522 Non-pressure chronic ulcer of other part of left foot with fat layer exposed 04/20/2022 No Yes E11.621 Type 2 diabetes mellitus with foot ulcer 04/20/2022 No Yes I73.9 Peripheral vascular disease, unspecified 04/20/2022 No Yes N18.4 Chronic kidney disease, stage 4 (severe) 04/20/2022 No Yes I25.10 Atherosclerotic heart disease of native coronary artery without angina pectoris 04/20/2022 No Yes I10 Essential (primary) hypertension 04/20/2022 No Yes Inactive Problems Resolved Problems Electronic Signature(s) Signed: 04/28/2022 1:16:54 PM By: Fredirick Maudlin MD FACS Entered By: Fredirick Maudlin on 04/28/2022  13:16:54 -------------------------------------------------------------------------------- Progress Note Details Patient Name: Date of Service: [name], Jarrel C. 04/28/2022 12:30 PM Medical Record Number: [id number] Patient Account Number: [id number] Date of Birth/Sex: Treating RN: 16-Dec-1940 (81 y.o. Ernestene Mention Primary Care Provider: Maudry Mayhew Other Clinician: Referring Provider: Treating Provider/Extender: Horris Latino in Treatment: 1 Subjective Chief Complaint Information obtained from Patient Patients presents for treatment of an open diabetic ulcer History of Present Illness (HPI) ADMISSION 04/20/2022 This is an 81 year old man with a past medical history significant for poorly controlled type 2 diabetes mellitus, coronary artery disease, peripheral vascular disease, stage IV chronic kidney disease, and hypertension. He has an open ulcer on the plantar surface of his left fifth metatarsal head. It apparently just gradually appeared in the site where he had a callus. Last hemoglobin A1c that I was able to find in the electronic medical record was from 2020 and was 10.8. Formal vascular studies were done in March 2023 and the results are copied here: +-------+-----------+-----------+------------+------------+ ABI/TBIT oday's ABIT oday's TBIPrevious ABIPrevious TBI +-------+-----------+-----------+------------+------------+ Right [place] 0.30 [place] 0.46  +-------+-----------+-----------+------------+------------+ Left 0.57 0.20 0.66 0.23  +-------+-----------+-----------+------------+------------+ Summary: Right: Resting right ankle-brachial index indicates noncompressible right lower extremity arteries. The right toe-brachial index is abnormal. Left: Resting left ankle-brachial index indicates moderate left lower extremity arterial disease; however, indices may be overestimated due to the presence of calcified vessels. The toe-index  is abnormal. The wound is fairly small, circular, and has a clean base. There is some periwound callus around the periphery. No significant undermining. Light layer of slough/biofilm on the surface. 04/28/2022: The wound measured a little bit larger today but this might be secondary to the debridement that I performed last week. The callus around the wound is macerated and starting to fall off of its own accord. The base of the wound itself is fairly clean with just a small amount of slough. No erythema, induration, or purulent drainage. Patient History Information obtained from Patient, Chart. Family History Heart Disease - Father, Stroke - Mother, No family history of Cancer, Diabetes, Hereditary Spherocytosis, Hypertension, Kidney Disease, Lung Disease, Seizures, Thyroid Problems, Tuberculosis. Social History Former smoker - quit 60 yrs ago, Marital Status - Widowed, Alcohol Use - Rarely, Drug Use - No History, Caffeine Use - Rarely. Medical History Eyes Patient has history of Cataracts - right removed Denies history of Glaucoma, Optic Neuritis Cardiovascular Patient has history of Angina - afib, Coronary Artery Disease, Hypertension, Peripheral Arterial Disease Endocrine Patient has history of Type II Diabetes Denies history of Type I Diabetes Genitourinary Denies history of End Stage Renal Disease Integumentary (Skin) Denies history of History of Burn Musculoskeletal Patient has history of Osteoarthritis - hands Neurologic Patient has history of Neuropathy Oncologic Denies history of Received Chemotherapy, Received Radiation Psychiatric Denies history of Anorexia/bulimia, Confinement Anxiety Hospitalization/Surgery History - CABG 3 vessel. - cholecystectomy. Medical A Surgical History Notes nd Cardiovascular ischemic cardiomyopathy, hyperlipidemia Genitourinary CKD stage 4 Oncologic skin CA removed  Objective Constitutional Hypertensive, asymptomatic. No acute  distress.. Vitals Time Taken: 12:50 PM, Height: 69 in, Weight: 195 lbs, BMI: 28.8, Temperature: 97.9 F, Pulse: 72 bpm, Respiratory Rate: 18 breaths/min, Blood Pressure: 157/70 mmHg, Capillary Blood Glucose: 198 mg/dl. General Notes: glucose per pt report this am Respiratory Normal work of breathing on room air.. General Notes: 04/28/2022: The wound measured a little bit larger today but this might be secondary to the debridement that I performed last week. The callus around the wound is macerated and starting to fall off of its own accord. The base of the wound itself is fairly clean with just a small amount of slough. No erythema, induration, or purulent drainage. Integumentary (Hair, Skin) Wound #1 status is Open. Original cause of wound was Gradually Appeared. The date acquired was: 03/25/2022. The wound has been in treatment 1 weeks. The wound is located on the Left Metatarsal head fifth. The wound measures 0.7cm length x 0.7cm width x 0.3cm depth; 0.385cm^2 area and 0.115cm^3 volume. There is Fat Layer (Subcutaneous Tissue) exposed. There is no tunneling noted, however, there is undermining starting at 6:00 and ending at 12:00 with a maximum distance of 0.2cm. There is a medium amount of serous drainage noted. The wound margin is well defined and not attached to the wound base. There is large (67-100%) red granulation within the wound bed. There is no necrotic tissue within the wound bed. General Notes: macerated wound edges Assessment Active Problems ICD-10 Non-pressure chronic ulcer of other part of left foot with fat layer exposed Type 2 diabetes mellitus with foot ulcer Peripheral vascular disease, unspecified Chronic kidney disease, stage 4 (severe) Atherosclerotic heart disease of native coronary artery without angina pectoris Essential (primary) hypertension Procedures Wound #1 Pre-procedure diagnosis of Wound #1 is a Diabetic Wound/Ulcer of the Lower Extremity located on the  Left Metatarsal head fifth .Severity of Tissue Pre Debridement is: Fat layer exposed. There was a Selective/Open Wound Skin/Epidermis Debridement with a total area of 2.25 sq cm performed by Fredirick Maudlin, MD. With the following instrument(s): Curette Material removed includes Callus, Slough, and Skin: Epidermis after achieving pain control using Lidocaine 4% Topical Solution. No specimens were taken. A time out was conducted at 13:05, prior to the start of the procedure. A Minimum amount of bleeding was controlled with Pressure. The procedure was tolerated well with a pain level of 0 throughout and a pain level of 0 following the procedure. Post Debridement Measurements: 0.7cm length x 0.7cm width x 0.2cm depth; 0.077cm^3 volume. Character of Wound/Ulcer Post Debridement is improved. Severity of Tissue Post Debridement is: Fat layer exposed. Post procedure Diagnosis Wound #1: Same as Pre-Procedure Plan Follow-up Appointments: Return Appointment in 1 week. - Dr. Celine Ahr RM 1 with [facility] Thursday 7/13 @ 12:30 pm Bathing/ Shower/ Hygiene: May shower and wash wound with soap and water. - with dressing changes Home Health: Dressing changes to be completed by [place] on Monday / Wednesday / Friday except when patient has scheduled visit at [facility]. Other Home Health Orders/Instructions: - [facility] The following medication(s) was prescribed: lidocaine topical 4 % cream cream topical for prior to debridement was prescribed at facility WOUND #1: - Metatarsal head fifth Wound Laterality: Left Prim Dressing: KerraCel Ag Gelling Fiber Dressing, 2x2 in (silver alginate) 3 x Per Week/30 Days ary Discharge Instructions: Apply silver alginate to wound bed as instructed Secondary Dressing: Optifoam Non-Adhesive Dressing, 4x4 in 3 x Per Week/30 Days Discharge Instructions: Apply over primary dressing cut to make foam  donut Secondary Dressing: Woven Gauze Sponges 2x2 in 3 x Per Week/30  Days Discharge Instructions: Apply over primary dressing as directed. Secured With: 64M Medipore H Soft Cloth Surgical T ape, 4 x 10 (in/yd) 3 x Per Week/30 Days Discharge Instructions: Secure with tape as directed. 04/28/2022: The wound measured a little bit larger today but this might be secondary to the debridement that I performed last week. The callus around the wound is macerated and starting to fall off of its own accord. The base of the wound itself is fairly clean with just a small amount of slough. No erythema, induration, or purulent drainage. I used a curette to debride slough from the wound surface and periwound callus surrounding the site. We will continue using silver alginate with a foam doughnut. Follow-up in 1 week. Electronic Signature(s) Signed: 04/28/2022 1:19:00 PM By: Fredirick Maudlin MD FACS Entered By: Fredirick Maudlin on 04/28/2022 13:19:00 -------------------------------------------------------------------------------- HxROS Details Patient Name: Date of Service: [name], Kenyon C. 04/28/2022 12:30 PM Medical Record Number: [id number] Patient Account Number: [id number] Date of Birth/Sex: Treating RN: [birth year] (81 y.o. Ernestene Mention Primary Care Provider: Maudry Mayhew Other Clinician: Referring Provider: Treating Provider/Extender: Horris Latino in Treatment: 1 Information Obtained From Patient Chart Eyes Medical History: Positive for: Cataracts - right removed Negative for: Glaucoma; Optic Neuritis Cardiovascular Medical History: Positive for: Angina - afib; Coronary Artery Disease; Hypertension; Peripheral Arterial Disease Past Medical History Notes: ischemic cardiomyopathy, hyperlipidemia Endocrine Medical History: Positive for: Type II Diabetes Negative for: Type I Diabetes Time with diabetes: since 1999 Treated with: Insulin, Oral agents Blood sugar tested every day: Yes Tested : once Genitourinary Medical  History: Negative for: End Stage Renal Disease Past Medical History Notes: CKD stage 4 Integumentary (Skin) Medical History: Negative for: History of Burn Musculoskeletal Medical History: Positive for: Osteoarthritis - hands Neurologic Medical History: Positive for: Neuropathy Oncologic Medical History: Negative for: Received Chemotherapy; Received Radiation Past Medical History Notes: skin CA removed Psychiatric Medical History: Negative for: Anorexia/bulimia; Confinement Anxiety HBO Extended History Items Eyes: Cataracts Immunizations Pneumococcal Vaccine: Received Pneumococcal Vaccination: Yes Received Pneumococcal Vaccination On or After 60th Birthday: Yes Implantable Devices No devices added Hospitalization / Surgery History Type of Hospitalization/Surgery CABG 3 vessel cholecystectomy Family and Social History Cancer: No; Diabetes: No; Heart Disease: Yes - Father; Hereditary Spherocytosis: No; Hypertension: No; Kidney Disease: No; Lung Disease: No; Seizures: No; Stroke: Yes - Mother; Thyroid Problems: No; Tuberculosis: No; Former smoker - quit 60 yrs ago; Marital Status - Widowed; Alcohol Use: Rarely; Drug Use: No History; Caffeine Use: Rarely; Financial Concerns: No; Food, Clothing or Shelter Needs: No; Support System Lacking: No; Transportation Concerns: No Engineer, maintenance) Signed: 04/28/2022 1:19:52 PM By: Fredirick Maudlin MD FACS Signed: 04/28/2022 5:32:47 PM By: Baruch Gouty RN, BSN Entered By: Fredirick Maudlin on 04/28/2022 13:17:49 -------------------------------------------------------------------------------- SuperBill Details Patient Name: Date of Service: [name], Shimshon C. 04/28/2022 Medical Record Number: [id number] Patient Account Number: [id number] Date of Birth/Sex: Treating RN: 01/03/1941 (81 y.o. Ernestene Mention Primary Care Provider: Maudry Mayhew Other Clinician: Referring Provider: Treating Provider/Extender: Horris Latino in Treatment: 1 Diagnosis Coding ICD-10 Codes Code Description [phone] Non-pressure chronic ulcer of other part of left foot with fat layer exposed E11.621 Type 2 diabetes mellitus with foot ulcer I73.9 Peripheral vascular disease, unspecified N18.4 Chronic kidney disease, stage 4 (severe) I25.10 Atherosclerotic heart disease of native coronary artery without angina pectoris I10 Essential (primary) hypertension Facility Procedures CPT4 Code: [id number] Description: [phone] - DEBRIDE  WOUND 1ST 20 SQ CM OR < ICD-10 Diagnosis Description L97.522 Non-pressure chronic ulcer of other part of left foot with fat layer exposed Modifier: Quantity: 1 Physician Procedures : CPT4 Code Description Modifier [id number] - WC PHYS LEVEL 3 - EST PT 25 ICD-10 Diagnosis Description L97.522 Non-pressure chronic ulcer of other part of left foot with fat layer exposed E11.621 Type 2 diabetes mellitus with foot ulcer I73.9  Peripheral vascular disease, unspecified N18.4 Chronic kidney disease, stage 4 (severe) Quantity: 1 : [phone] [id number] - WC PHYS DEBR WO ANESTH 20 SQ CM ICD-10 Diagnosis Description L97.522 Non-pressure chronic ulcer of other part of left foot with fat layer exposed Quantity: 1 Electronic Signature(s) Signed: 04/28/2022 1:19:21 PM By: Fredirick Maudlin MD FACS Entered By: Fredirick Maudlin on 04/28/2022 13:19:21

## 2022-05-04 ENCOUNTER — Ambulatory Visit: Payer: Medicare Other | Admitting: Podiatry

## 2022-05-06 ENCOUNTER — Encounter (HOSPITAL_BASED_OUTPATIENT_CLINIC_OR_DEPARTMENT_OTHER): Payer: Medicare Other | Admitting: General Surgery

## 2022-05-06 DIAGNOSIS — E11621 Type 2 diabetes mellitus with foot ulcer: Secondary | ICD-10-CM | POA: Diagnosis not present

## 2022-05-06 NOTE — Progress Notes (Signed)
SOHAIB, VEREEN ([id number]) Visit Report for 05/06/2022 Arrival Information Details Patient Name: Date of Service: [name], HOLT WOOLBRIGHT 05/06/2022 2:00 PM Medical Record Number: [id number] Patient Account Number: [id number] Date of Birth/Sex: Treating RN: [birth year] (81 y.o. Ernestene Mention Primary Care Schuyler Olden: Maudry Mayhew Other Clinician: Referring Izac Faulkenberry: Treating Kristiana Jacko/Extender: Horris Latino in Treatment: 2 Visit Information History Since Last Visit Added or deleted any medications: No Patient Arrived: Gilford Rile Any new allergies or adverse reactions: No Arrival Time: 14:23 Had a fall or experienced change in No Accompanied By: self activities of daily living that may affect Transfer Assistance: None risk of falls: Patient Identification Verified: Yes Signs or symptoms of abuse/neglect since last visito No Secondary Verification Process Completed: Yes Hospitalized since last visit: No Patient Has Alerts: Yes Implantable device outside of the clinic excluding No Patient Alerts: Patient on Blood Thinner cellular tissue based products placed in the center R ABI = N/C TBI = .30 since last visit: L ABI = .57 TBI = .20 Has Dressing in Place as Prescribed: Yes Pain Present Now: No Electronic Signature(s) Signed: 05/06/2022 5:30:43 PM By: Baruch Gouty RN, BSN Entered By: Baruch Gouty on 05/06/2022 14:24:35 -------------------------------------------------------------------------------- Encounter Discharge Information Details Patient Name: Date of Service: [name], Thaddius C. 05/06/2022 2:00 PM Medical Record Number: [id number] Patient Account Number: [id number] Date of Birth/Sex: Treating RN: [birth year] (81 y.o. Ernestene Mention Primary Care Luceil Herrin: Maudry Mayhew Other Clinician: Referring Jaleeah Slight: Treating Kemba Hoppes/Extender: Horris Latino in Treatment: 2 Encounter Discharge Information Items Post Procedure  Vitals Discharge Condition: Stable Temperature (F): 98.2 Ambulatory Status: Walker Pulse (bpm): 66 Discharge Destination: Home Respiratory Rate (breaths/min): 18 Transportation: Other Blood Pressure (mmHg): 182/79 Accompanied By: self Schedule Follow-up Appointment: Yes Clinical Summary of Care: Patient Declined Notes facility transportation Electronic Signature(s) Signed: 05/06/2022 5:30:43 PM By: Baruch Gouty RN, BSN Entered By: Baruch Gouty on 05/06/2022 15:10:47 -------------------------------------------------------------------------------- Lower Extremity Assessment Details Patient Name: Date of Service: [name], Barry C. 05/06/2022 2:00 PM Medical Record Number: [id number] Patient Account Number: [id number] Date of Birth/Sex: Treating RN: 11-Mar-1941 (81 y.o. Ernestene Mention Primary Care Lathen Seal: Maudry Mayhew Other Clinician: Referring Meiko Ives: Treating Margerie Fraiser/Extender: Horris Latino in Treatment: 2 Edema Assessment Assessed: [Left: No] [Right: No] Edema: [Left: Ye] [Right: s] Calf Left: Right: Point of Measurement: From Medial Instep 34.5 cm Ankle Left: Right: Point of Measurement: From Medial Instep 22.5 cm Vascular Assessment Pulses: Dorsalis Pedis Palpable: [Left:Yes] Electronic Signature(s) Signed: 05/06/2022 5:30:43 PM By: Baruch Gouty RN, BSN Entered By: Baruch Gouty on 05/06/2022 14:28:37 -------------------------------------------------------------------------------- Multi Wound Chart Details Patient Name: Date of Service: [name], Zuriel C. 05/06/2022 2:00 PM Medical Record Number: [id number] Patient Account Number: [id number] Date of Birth/Sex: Treating RN: 1941-01-06 (81 y.o. Ernestene Mention Primary Care Cintia Gleed: Maudry Mayhew Other Clinician: Referring Michon Kaczmarek: Treating Ryosuke Ericksen/Extender: Horris Latino in Treatment: 2 Vital Signs Height(in): 24 Capillary Blood  Glucose(mg/dl): 225 Weight(lbs): 195 Pulse(bpm): 80 Body Mass Index(BMI): 28.8 Blood Pressure(mmHg): 182/79 Temperature(F): 98.2 Respiratory Rate(breaths/min): 18 Photos: [1:Left Metatarsal head fifth] [N/A:N/A N/A] Wound Location: [1:Gradually Appeared] [N/A:N/A] Wounding Event: [1:Diabetic Wound/Ulcer of the Lower] [N/A:N/A] Primary Etiology: [1:Extremity Cataracts, Angina, Coronary Artery N/A] Comorbid History: [1:Disease, Hypertension, Peripheral Arterial Disease, Type II Diabetes, Osteoarthritis, Neuropathy 03/25/2022] [N/A:N/A] Date Acquired: [1:2] [N/A:N/A] Weeks of Treatment: [1:Open] [N/A:N/A] Wound Status: [1:No] [N/A:N/A] Wound Recurrence: [1:0.5x0.7x0.3] [N/A:N/A] Measurements L x W x D (cm) [1:0.275] [N/A:N/A] A (cm) : rea [1:0.082] [N/A:N/A] Volume (cm) : [1:-287.30%] [N/A:N/A] %  Reduction in A [1:rea: -192.90%] [N/A:N/A] % Reduction in Volume: [1:12] Starting Position 1 (o'clock): [1:12] Ending Position 1 (o'clock): [1:0.3] Maximum Distance 1 (cm): [1:Yes] [N/A:N/A] Undermining: [1:Grade 2] [N/A:N/A] Classification: [1:Medium] [N/A:N/A] Exudate A mount: [1:Serosanguineous] [N/A:N/A] Exudate Type: [1:red, brown] [N/A:N/A] Exudate Color: [1:Well defined, not attached] [N/A:N/A] Wound Margin: [1:Large (67-100%)] [N/A:N/A] Granulation A mount: [1:Pink, Pale] [N/A:N/A] Granulation Quality: [1:Small (1-33%)] [N/A:N/A] Necrotic A mount: [1:Fat Layer (Subcutaneous Tissue): Yes N/A] Exposed Structures: [1:Fascia: No Tendon: No Muscle: No Joint: No Bone: No None] [N/A:N/A] Epithelialization: [1:Debridement - Excisional] [N/A:N/A] Debridement: Pre-procedure Verification/Time Out 14:55 [N/A:N/A] Taken: [1:Lidocaine 4% Topical Solution] [N/A:N/A] Pain Control: [1:Callus, Subcutaneous, Slough] [N/A:N/A] Tissue Debrided: [1:Skin/Subcutaneous Tissue] [N/A:N/A] Level: [1:0.64] [N/A:N/A] Debridement A (sq cm): [1:rea Curette] [N/A:N/A] Instrument: [1:Minimum]  [N/A:N/A] Bleeding: [1:Pressure] [N/A:N/A] Hemostasis A chieved: [1:2] [N/A:N/A] Procedural Pain: [1:1] [N/A:N/A] Post Procedural Pain: [1:Procedure was tolerated well] [N/A:N/A] Debridement Treatment Response: [1:0.8x0.8x0.3] [N/A:N/A] Post Debridement Measurements L x W x D (cm) [1:0.151] [N/A:N/A] Post Debridement Volume: (cm) [1:Debridement] [N/A:N/A] Treatment Notes Electronic Signature(s) Signed: 05/06/2022 3:07:39 PM By: Fredirick Maudlin MD FACS Signed: 05/06/2022 5:30:43 PM By: Baruch Gouty RN, BSN Entered By: Fredirick Maudlin on 05/06/2022 15:07:39 -------------------------------------------------------------------------------- Multi-Disciplinary Care Plan Details Patient Name: Date of Service: [name], Jonovan C. 05/06/2022 2:00 PM Medical Record Number: [id number] Patient Account Number: [id number] Date of Birth/Sex: Treating RN: 1941/09/20 (81 y.o. Ernestene Mention Primary Care Eular Panek: Maudry Mayhew Other Clinician: Referring Kayston Jodoin: Treating Olivette Beckmann/Extender: Horris Latino in Treatment: 2 Multidisciplinary Care Plan reviewed with physician Active Inactive Nutrition Nursing Diagnoses: Impaired glucose control: actual or potential Potential for alteratiion in Nutrition/Potential for imbalanced nutrition Goals: Patient/caregiver will maintain therapeutic glucose control Date Initiated: 04/20/2022 Target Resolution Date: 05/18/2022 Goal Status: Active Interventions: Assess HgA1c results as ordered upon admission and as needed Assess patient nutrition upon admission and as needed per policy Treatment Activities: Giving encouragement to exercise : 04/20/2022 Patient referred to Primary Care Physician for further nutritional evaluation : 04/20/2022 Notes: Wound/Skin Impairment Nursing Diagnoses: Impaired tissue integrity Knowledge deficit related to ulceration/compromised skin integrity Goals: Patient/caregiver will verbalize  understanding of skin care regimen Date Initiated: 04/20/2022 Target Resolution Date: 05/18/2022 Goal Status: Active Ulcer/skin breakdown will have a volume reduction of 30% by week 4 Date Initiated: 04/20/2022 Target Resolution Date: 05/18/2022 Goal Status: Active Interventions: Assess patient/caregiver ability to obtain necessary supplies Assess patient/caregiver ability to perform ulcer/skin care regimen upon admission and as needed Assess ulceration(s) every visit Provide education on ulcer and skin care Treatment Activities: Skin care regimen initiated : 04/20/2022 Topical wound management initiated : 04/20/2022 Notes: Electronic Signature(s) Signed: 05/06/2022 5:30:43 PM By: Baruch Gouty RN, BSN Entered By: Baruch Gouty on 05/06/2022 14:32:17 -------------------------------------------------------------------------------- Pain Assessment Details Patient Name: Date of Service: [name], Gearld C. 05/06/2022 2:00 PM Medical Record Number: [id number] Patient Account Number: [id number] Date of Birth/Sex: Treating RN: 1941-06-15 (81 y.o. Ernestene Mention Primary Care Koki Buxton: Maudry Mayhew Other Clinician: Referring Tenaya Hilyer: Treating Johnhenry Tippin/Extender: Horris Latino in Treatment: 2 Active Problems Location of Pain Severity and Description of Pain Patient Has Paino No Site Locations Site Locations Rate the pain. Current Pain Level: 0 Pain Management and Medication Current Pain Management: Electronic Signature(s) Signed: 05/06/2022 5:30:43 PM By: Baruch Gouty RN, BSN Entered By: Baruch Gouty on 05/06/2022 14:26:53 -------------------------------------------------------------------------------- Patient/Caregiver Education Details Patient Name: Date of Service: [name], Baran Loletha Grayer 7/13/2023andnbsp2:00 PM Medical Record Number: [id number] Patient Account Number: [id number] Date of Birth/Gender: Treating RN: Jul 13, 1941 (81 y.o. Ulyses Amor,  Vaughan Basta Primary Care Physician: Maudry Mayhew Other Clinician: Referring Physician: Treating Physician/Extender: Horris Latino in Treatment: 2 Education Assessment Education Provided To: Patient Education Topics Provided Offloading: Methods: Explain/Verbal Responses: Reinforcements needed, State content correctly Wound/Skin Impairment: Methods: Explain/Verbal Responses: Reinforcements needed, State content correctly Electronic Signature(s) Signed: 05/06/2022 5:30:43 PM By: Baruch Gouty RN, BSN Entered By: Baruch Gouty on 05/06/2022 14:33:18 -------------------------------------------------------------------------------- Wound Assessment Details Patient Name: Date of Service: [name], Collins C. 05/06/2022 2:00 PM Medical Record Number: [id number] Patient Account Number: [id number] Date of Birth/Sex: Treating RN: [birth year] (81 y.o. Ernestene Mention Primary Care Evens Meno: Maudry Mayhew Other Clinician: Referring Jakirah Zaun: Treating Cheskel Silverio/Extender: Horris Latino in Treatment: 2 Wound Status Wound Number: 1 Primary Diabetic Wound/Ulcer of the Lower Extremity Etiology: Wound Location: Left Metatarsal head fifth Wound Open Wounding Event: Gradually Appeared Status: Date Acquired: 03/25/2022 Comorbid Cataracts, Angina, Coronary Artery Disease, Hypertension, Weeks Of Treatment: 2 History: Peripheral Arterial Disease, Type II Diabetes, Osteoarthritis, Clustered Wound: No Neuropathy Photos Wound Measurements Length: (cm) 0.5 Width: (cm) 0.7 Depth: (cm) 0.3 Area: (cm) 0.275 Volume: (cm) 0.082 % Reduction in Area: -287.3% % Reduction in Volume: -192.9% Epithelialization: None Tunneling: No Undermining: Yes Starting Position (o'clock): 12 Ending Position (o'clock): 12 Maximum Distance: (cm) 0.3 Wound Description Classification: Grade 2 Wound Margin: Well defined, not attached Exudate Amount: Medium Exudate  Type: Serosanguineous Exudate Color: red, brown Foul Odor After Cleansing: No Slough/Fibrino No Wound Bed Granulation Amount: Large (67-100%) Exposed Structure Granulation Quality: Pink, Pale Fascia Exposed: No Necrotic Amount: Small (1-33%) Fat Layer (Subcutaneous Tissue) Exposed: Yes Necrotic Quality: Adherent Slough Tendon Exposed: No Muscle Exposed: No Joint Exposed: No Bone Exposed: No Treatment Notes Wound #1 (Metatarsal head fifth) Wound Laterality: Left Cleanser Peri-Wound Care Topical Primary Dressing KerraCel Ag Gelling Fiber Dressing, 2x2 in (silver alginate) Discharge Instruction: Apply silver alginate to wound bed as instructed Secondary Dressing Optifoam Non-Adhesive Dressing, 4x4 in Discharge Instruction: Apply over primary dressing cut to make foam donut Woven Gauze Sponges 2x2 in Discharge Instruction: Apply over primary dressing as directed. Secured With 81M Medipore H Soft Cloth Surgical T ape, 4 x 10 (in/yd) Discharge Instruction: Secure with tape as directed. Compression Wrap Compression Stockings Add-Ons Electronic Signature(s) Signed: 05/06/2022 5:30:43 PM By: Baruch Gouty RN, BSN Entered By: Baruch Gouty on 05/06/2022 14:31:53 -------------------------------------------------------------------------------- Vitals Details Patient Name: Date of Service: [name], Jaxden C. 05/06/2022 2:00 PM Medical Record Number: [id number] Patient Account Number: [id number] Date of Birth/Sex: Treating RN: 1941-07-15 (81 y.o. Ernestene Mention Primary Care Adriyana Greenbaum: Maudry Mayhew Other Clinician: Referring Deriona Altemose: Treating Adrionna Delcid/Extender: Horris Latino in Treatment: 2 Vital Signs Time Taken: 14:24 Temperature (F): 98.2 Height (in): 69 Pulse (bpm): 66 Weight (lbs): 195 Respiratory Rate (breaths/min): 18 Body Mass Index (BMI): 28.8 Blood Pressure (mmHg): 182/79 Capillary Blood Glucose (mg/dl): 225 Reference Range: 80 -  120 mg / dl Electronic Signature(s) Signed: 05/06/2022 5:30:43 PM By: Baruch Gouty RN, BSN Entered By: Baruch Gouty on 05/06/2022 14:26:45

## 2022-05-06 NOTE — Progress Notes (Addendum)
GARVEY, WESTCOTT ([id number]) Visit Report for 05/06/2022 Chief Complaint Document Details Patient Name: Date of Service: Steve [name], Steve [name] 05/06/2022 2:00 PM Medical Record Number: [id number] Patient Account Number: [id number] Date of Birth/Sex: Treating RN: 1941/01/31 (81 y.o. Steve [name] Primary Care Provider: Maudry Mayhew Other Clinician: Referring Provider: Treating Provider/Extender: Horris Latino in Treatment: 2 Information Obtained from: Patient Chief Complaint Patients presents for treatment of an open diabetic ulcer Electronic Signature(s) Signed: 05/06/2022 3:07:46 PM By: Fredirick Maudlin MD FACS Entered By: Fredirick Maudlin on 05/06/2022 15:07:46 -------------------------------------------------------------------------------- Debridement Details Patient Name: Date of Service: Steve [name], [name] 05/06/2022 2:00 PM Medical Record Number: [id number] Patient Account Number: [id number] Date of Birth/Sex: Treating RN: 09/14/1941 (81 y.o. Steve [name] Primary Care Provider: Maudry Mayhew Other Clinician: Referring Provider: Treating Provider/Extender: Horris Latino in Treatment: 2 Debridement Performed for Assessment: Wound #1 Left Metatarsal head fifth Performed By: Physician Fredirick Maudlin, MD Debridement Type: Debridement Severity of Tissue Pre Debridement: Fat layer exposed Level of Consciousness (Pre-procedure): Awake and Alert Pre-procedure Verification/Time Out Yes - 14:55 Taken: Start Time: 14:56 Pain Control: Lidocaine 4% T opical Solution T Area Debrided (L x W): otal 0.8 (cm) x 0.8 (cm) = 0.64 (cm) Tissue and other material debrided: Non-Viable, Callus, Slough, Subcutaneous, Skin: Epidermis, Slough Level: Skin/Subcutaneous Tissue Debridement Description: Excisional Instrument: Curette Bleeding: Minimum Hemostasis Achieved: Pressure Procedural Pain: 2 Post Procedural Pain: 1 Response to  Treatment: Procedure was tolerated well Level of Consciousness (Post- Awake and Alert procedure): Post Debridement Measurements of Total Wound Length: (cm) 0.8 Width: (cm) 0.8 Depth: (cm) 0.3 Volume: (cm) 0.151 Character of Wound/Ulcer Post Debridement: Improved Severity of Tissue Post Debridement: Fat layer exposed Post Procedure Diagnosis Same as Pre-procedure Electronic Signature(s) Signed: 05/06/2022 4:13:42 PM By: Fredirick Maudlin MD FACS Signed: 05/06/2022 5:30:43 PM By: Baruch Gouty RN, BSN Entered By: Baruch Gouty on 05/06/2022 14:57:32 -------------------------------------------------------------------------------- HPI Details Patient Name: Date of Service: Steve [name], [name] 05/06/2022 2:00 PM Medical Record Number: [id number] Patient Account Number: [id number] Date of Birth/Sex: Treating RN: [birth year] (81 y.o. Steve [name] Primary Care Provider: Maudry Mayhew Other Clinician: Referring Provider: Treating Provider/Extender: Horris Latino in Treatment: 2 History of Present Illness HPI Description: ADMISSION 04/20/2022 This is an 81 year old man with a past medical history significant for poorly controlled type 2 diabetes mellitus, coronary artery disease, peripheral vascular disease, stage IV chronic kidney disease, and hypertension. He has an open ulcer on the plantar surface of his left fifth metatarsal head. It apparently just gradually appeared in the site where he had a callus. Last hemoglobin A1c that I was able to find in the electronic medical record was from 2020 and was 10.8. Formal vascular studies were done in March 2023 and the results are copied here: +-------+-----------+-----------+------------+------------+ ABI/TBIT oday's ABIT oday's TBIPrevious ABIPrevious TBI +-------+-----------+-----------+------------+------------+ Right [place] 0.30 [place] 0.46   +-------+-----------+-----------+------------+------------+ Left 0.57 0.20 0.66 0.23  +-------+-----------+-----------+------------+------------+ Summary: Right: Resting right ankle-brachial index indicates noncompressible right lower extremity arteries. The right toe-brachial index is abnormal. Left: Resting left ankle-brachial index indicates moderate left lower extremity arterial disease; however, indices may be overestimated due to the presence of calcified vessels. The toe-index is abnormal. The wound is fairly small, circular, and has a clean base. There is some periwound callus around the periphery. No significant undermining. Light layer of slough/biofilm on the surface. 04/28/2022: The wound measured a little bit larger today but this might be secondary to the debridement that I performed  last week. The callus around the wound is macerated and starting to fall off of its own accord. The base of the wound itself is fairly clean with just a small amount of slough. No erythema, induration, or purulent drainage. 05/06/2022: The wound is basically unchanged from last week. There is a little bit of undermining around the perimeter but the surface is clean with just a little bit of slough buildup. Electronic Signature(s) Signed: 05/06/2022 3:08:25 PM By: Fredirick Maudlin MD FACS Entered By: Fredirick Maudlin on 05/06/2022 15:08:25 -------------------------------------------------------------------------------- Physical Exam Details Patient Name: Date of Service: Steve [name], [name] 05/06/2022 2:00 PM Medical Record Number: [id number] Patient Account Number: [id number] Date of Birth/Sex: Treating RN: Dec 31, 1940 (81 y.o. Steve [name] Primary Care Provider: Maudry Mayhew Other Clinician: Referring Provider: Treating Provider/Extender: Horris Latino in Treatment: 2 Constitutional Hypertensive, asymptomatic. . . . No acute distress.Marland Kitchen Respiratory Normal  work of breathing on room air.. Notes 05/06/2022: The wound is basically unchanged from last week. There is a little bit of undermining around the perimeter but the surface is clean with just a little bit of slough buildup. Electronic Signature(s) Signed: 05/06/2022 3:08:56 PM By: Fredirick Maudlin MD FACS Entered By: Fredirick Maudlin on 05/06/2022 15:08:56 -------------------------------------------------------------------------------- Physician Orders Details Patient Name: Date of Service: Steve [name], [name] 05/06/2022 2:00 PM Medical Record Number: [id number] Patient Account Number: [id number] Date of Birth/Sex: Treating RN: [birth year] (81 y.o. Steve [name] Primary Care Provider: Maudry Mayhew Other Clinician: Referring Provider: Treating Provider/Extender: Horris Latino in Treatment: 2 Verbal / Phone Orders: No Diagnosis Coding ICD-10 Coding Code Description [phone] Non-pressure chronic ulcer of other part of left foot with fat layer exposed E11.621 Type 2 diabetes mellitus with foot ulcer I73.9 Peripheral vascular disease, unspecified N18.4 Chronic kidney disease, stage 4 (severe) I25.10 Atherosclerotic heart disease of native coronary artery without angina pectoris I10 Essential (primary) hypertension Follow-up Appointments ppointment in 2 weeks. - Dr. Celine Ahr RM 1 with [facility] Return A Thursday 7/27 @ 2:00 pm Bathing/ Shower/ Hygiene May shower and wash wound with soap and water. - with dressing changes Home Health Dressing changes to be completed by [place] on Monday / Wednesday / Friday except when patient has scheduled visit at [facility]. Other Home Health Orders/Instructions: - [facility] Wound Treatment Wound #1 - Metatarsal head fifth Wound Laterality: Left Prim Dressing: KerraCel Ag Gelling Fiber Dressing, 2x2 in (silver alginate) 3 x Per Week/30 Days ary Discharge Instructions: Apply silver alginate to wound bed as  instructed Secondary Dressing: Optifoam Non-Adhesive Dressing, 4x4 in 3 x Per Week/30 Days Discharge Instructions: Apply over primary dressing cut to make foam donut Secondary Dressing: Woven Gauze Sponges 2x2 in 3 x Per Week/30 Days Discharge Instructions: Apply over primary dressing as directed. Secured With: 22M Medipore H Soft Cloth Surgical T ape, 4 x 10 (in/yd) 3 x Per Week/30 Days Discharge Instructions: Secure with tape as directed. Electronic Signature(s) Signed: 05/06/2022 3:12:00 PM By: Fredirick Maudlin MD FACS Entered By: Fredirick Maudlin on 05/06/2022 15:12:00 -------------------------------------------------------------------------------- Problem List Details Patient Name: Date of Service: Steve [name], [name] 05/06/2022 2:00 PM Medical Record Number: [id number] Patient Account Number: [id number] Date of Birth/Sex: Treating RN: Jan 15, 1941 (81 y.o. Steve [name] Primary Care Provider: Maudry Mayhew Other Clinician: Referring Provider: Treating Provider/Extender: Horris Latino in Treatment: 2 Active Problems ICD-10 Encounter Code Description Active Date MDM Diagnosis [phone] Non-pressure chronic ulcer of other part of left foot with fat layer exposed 04/20/2022  No Yes E11.621 Type 2 diabetes mellitus with foot ulcer 04/20/2022 No Yes I73.9 Peripheral vascular disease, unspecified 04/20/2022 No Yes N18.4 Chronic kidney disease, stage 4 (severe) 04/20/2022 No Yes I25.10 Atherosclerotic heart disease of native coronary artery without angina pectoris 04/20/2022 No Yes I10 Essential (primary) hypertension 04/20/2022 No Yes Inactive Problems Resolved Problems Electronic Signature(s) Signed: 05/06/2022 3:07:30 PM By: Fredirick Maudlin MD FACS Entered By: Fredirick Maudlin on 05/06/2022 15:07:30 -------------------------------------------------------------------------------- Progress Note Details Patient Name: Date of Service: Steve [name], [name] 05/06/2022  2:00 PM Medical Record Number: [id number] Patient Account Number: [id number] Date of Birth/Sex: Treating RN: Oct 05, 1941 (81 y.o. Steve [name] Primary Care Provider: Maudry Mayhew Other Clinician: Referring Provider: Treating Provider/Extender: Horris Latino in Treatment: 2 Subjective Chief Complaint Information obtained from Patient Patients presents for treatment of an open diabetic ulcer History of Present Illness (HPI) ADMISSION 04/20/2022 This is an 81 year old man with a past medical history significant for poorly controlled type 2 diabetes mellitus, coronary artery disease, peripheral vascular disease, stage IV chronic kidney disease, and hypertension. He has an open ulcer on the plantar surface of his left fifth metatarsal head. It apparently just gradually appeared in the site where he had a callus. Last hemoglobin A1c that I was able to find in the electronic medical record was from 2020 and was 10.8. Formal vascular studies were done in March 2023 and the results are copied here: +-------+-----------+-----------+------------+------------+ ABI/TBIT oday's ABIT oday's TBIPrevious ABIPrevious TBI +-------+-----------+-----------+------------+------------+ Right [place] 0.30 [place] 0.46  +-------+-----------+-----------+------------+------------+ Left 0.57 0.20 0.66 0.23  +-------+-----------+-----------+------------+------------+ Summary: Right: Resting right ankle-brachial index indicates noncompressible right lower extremity arteries. The right toe-brachial index is abnormal. Left: Resting left ankle-brachial index indicates moderate left lower extremity arterial disease; however, indices may be overestimated due to the presence of calcified vessels. The toe-index is abnormal. The wound is fairly small, circular, and has a clean base. There is some periwound callus around the periphery. No significant undermining. Light layer  of slough/biofilm on the surface. 04/28/2022: The wound measured a little bit larger today but this might be secondary to the debridement that I performed last week. The callus around the wound is macerated and starting to fall off of its own accord. The base of the wound itself is fairly clean with just a small amount of slough. No erythema, induration, or purulent drainage. 05/06/2022: The wound is basically unchanged from last week. There is a little bit of undermining around the perimeter but the surface is clean with just a little bit of slough buildup. Patient History Information obtained from Patient, Chart. Family History Heart Disease - Father, Stroke - Mother, No family history of Cancer, Diabetes, Hereditary Spherocytosis, Hypertension, Kidney Disease, Lung Disease, Seizures, Thyroid Problems, Tuberculosis. Social History Former smoker - quit 60 yrs ago, Marital Status - Widowed, Alcohol Use - Rarely, Drug Use - No History, Caffeine Use - Rarely. Medical History Eyes Patient has history of Cataracts - right removed Denies history of Glaucoma, Optic Neuritis Cardiovascular Patient has history of Angina - afib, Coronary Artery Disease, Hypertension, Peripheral Arterial Disease Endocrine Patient has history of Type II Diabetes Denies history of Type I Diabetes Genitourinary Denies history of End Stage Renal Disease Integumentary (Skin) Denies history of History of Burn Musculoskeletal Patient has history of Osteoarthritis - hands Neurologic Patient has history of Neuropathy Oncologic Denies history of Received Chemotherapy, Received Radiation Psychiatric Denies history of Anorexia/bulimia, Confinement Anxiety Hospitalization/Surgery History - CABG 3 vessel. - cholecystectomy. Medical A Surgical History Notes [name]  Cardiovascular ischemic cardiomyopathy, hyperlipidemia Genitourinary CKD stage 4 Oncologic skin CA removed Objective Constitutional Hypertensive,  asymptomatic. No acute distress.. Vitals Time Taken: 2:24 PM, Height: 69 in, Weight: 195 lbs, BMI: 28.8, Temperature: 98.2 F, Pulse: 66 bpm, Respiratory Rate: 18 breaths/min, Blood Pressure: 182/79 mmHg, Capillary Blood Glucose: 225 mg/dl. Respiratory Normal work of breathing on room air.. General Notes: 05/06/2022: The wound is basically unchanged from last week. There is a little bit of undermining around the perimeter but the surface is clean with just a little bit of slough buildup. Integumentary (Hair, Skin) Wound #1 status is Open. Original cause of wound was Gradually Appeared. The date acquired was: 03/25/2022. The wound has been in treatment 2 weeks. The wound is located on the Left Metatarsal head fifth. The wound measures 0.5cm length x 0.7cm width x 0.3cm depth; 0.275cm^2 area and 0.082cm^3 volume. There is Fat Layer (Subcutaneous Tissue) exposed. There is no tunneling noted, however, there is undermining starting at 12:00 and ending at 12:00 with a maximum distance of 0.3cm. There is a medium amount of serosanguineous drainage noted. The wound margin is well defined and not attached to the wound base. There is large (67-100%) pink, pale granulation within the wound bed. There is a small (1-33%) amount of necrotic tissue within the wound bed including Adherent Slough. Assessment Active Problems ICD-10 Non-pressure chronic ulcer of other part of left foot with fat layer exposed Type 2 diabetes mellitus with foot ulcer Peripheral vascular disease, unspecified Chronic kidney disease, stage 4 (severe) Atherosclerotic heart disease of native coronary artery without angina pectoris Essential (primary) hypertension Procedures Wound #1 Pre-procedure diagnosis of Wound #1 is a Diabetic Wound/Ulcer of the Lower Extremity located on the Left Metatarsal head fifth .Severity of Tissue Pre Debridement is: Fat layer exposed. There was a Excisional Skin/Subcutaneous Tissue Debridement with a  total area of 0.64 sq cm performed by Fredirick Maudlin, MD. With the following instrument(s): Curette to remove Non-Viable tissue/material. Material removed includes Callus, Subcutaneous Tissue, Slough, and Skin: Epidermis after achieving pain control using Lidocaine 4% Topical Solution. No specimens were taken. A time out was conducted at 14:55, prior to the start of the procedure. A Minimum amount of bleeding was controlled with Pressure. The procedure was tolerated well with a pain level of 2 throughout and a pain level of 1 following the procedure. Post Debridement Measurements: 0.8cm length x 0.8cm width x 0.3cm depth; 0.151cm^3 volume. Character of Wound/Ulcer Post Debridement is improved. Severity of Tissue Post Debridement is: Fat layer exposed. Post procedure Diagnosis Wound #1: Same as Pre-Procedure Plan Follow-up Appointments: Return Appointment in 2 weeks. - Dr. Celine Ahr RM 1 with [facility] Thursday 7/27 @ 2:00 pm Bathing/ Shower/ Hygiene: May shower and wash wound with soap and water. - with dressing changes Home Health: Dressing changes to be completed by [place] on Monday / Wednesday / Friday except when patient has scheduled visit at [facility]. Other Home Health Orders/Instructions: - [facility] WOUND #1: - Metatarsal head fifth Wound Laterality: Left Prim Dressing: KerraCel Ag Gelling Fiber Dressing, 2x2 in (silver alginate) 3 x Per Week/30 Days ary Discharge Instructions: Apply silver alginate to wound bed as instructed Secondary Dressing: Optifoam Non-Adhesive Dressing, 4x4 in 3 x Per Week/30 Days Discharge Instructions: Apply over primary dressing cut to make foam donut Secondary Dressing: Woven Gauze Sponges 2x2 in 3 x Per Week/30 Days Discharge Instructions: Apply over primary dressing as directed. Secured With: 59M Medipore H Soft Cloth Surgical T ape, 4 x 10 (in/yd)  3 x Per Week/30 Days Discharge Instructions: Secure with tape as directed. 05/06/2022: The wound is  basically unchanged from last week. There is a little bit of undermining around the perimeter but the surface is clean with just a little bit of slough buildup. I used a curette to saucerized the wound and eliminate the undermining. This involved debriding some callus and senescent skin along with slough from the wound surface. We will continue using silver alginate. He will follow-up in 2 weeks. Electronic Signature(s) Signed: 05/06/2022 3:12:37 PM By: Fredirick Maudlin MD FACS Entered By: Fredirick Maudlin on 05/06/2022 15:12:37 -------------------------------------------------------------------------------- HxROS Details Patient Name: Date of Service: Steve [name], [name] 05/06/2022 2:00 PM Medical Record Number: [id number] Patient Account Number: [id number] Date of Birth/Sex: Treating RN: 1941-09-18 (81 y.o. Steve [name] Primary Care Provider: Maudry Mayhew Other Clinician: Referring Provider: Treating Provider/Extender: Horris Latino in Treatment: 2 Information Obtained From Patient Chart Eyes Medical History: Positive for: Cataracts - right removed Negative for: Glaucoma; Optic Neuritis Cardiovascular Medical History: Positive for: Angina - afib; Coronary Artery Disease; Hypertension; Peripheral Arterial Disease Past Medical History Notes: ischemic cardiomyopathy, hyperlipidemia Endocrine Medical History: Positive for: Type II Diabetes Negative for: Type I Diabetes Time with diabetes: since 1999 Treated with: Insulin, Oral agents Blood sugar tested every day: Yes Tested : once Genitourinary Medical History: Negative for: End Stage Renal Disease Past Medical History Notes: CKD stage 4 Integumentary (Skin) Medical History: Negative for: History of Burn Musculoskeletal Medical History: Positive for: Osteoarthritis - hands Neurologic Medical History: Positive for: Neuropathy Oncologic Medical History: Negative for: Received Chemotherapy;  Received Radiation Past Medical History Notes: skin CA removed Psychiatric Medical History: Negative for: Anorexia/bulimia; Confinement Anxiety HBO Extended History Items Eyes: Cataracts Immunizations Pneumococcal Vaccine: Received Pneumococcal Vaccination: Yes Received Pneumococcal Vaccination On or After 60th Birthday: Yes Implantable Devices No devices added Hospitalization / Surgery History Type of Hospitalization/Surgery CABG 3 vessel cholecystectomy Family and Social History Cancer: No; Diabetes: No; Heart Disease: Yes - Father; Hereditary Spherocytosis: No; Hypertension: No; Kidney Disease: No; Lung Disease: No; Seizures: No; Stroke: Yes - Mother; Thyroid Problems: No; Tuberculosis: No; Former smoker - quit 60 yrs ago; Marital Status - Widowed; Alcohol Use: Rarely; Drug Use: No History; Caffeine Use: Rarely; Financial Concerns: No; Food, Clothing or Shelter Needs: No; Support System Lacking: No; Transportation Concerns: No Engineer, maintenance) Signed: 05/06/2022 4:13:42 PM By: Fredirick Maudlin MD FACS Signed: 05/06/2022 5:30:43 PM By: Baruch Gouty RN, BSN Entered By: Fredirick Maudlin on 05/06/2022 15:08:34 -------------------------------------------------------------------------------- SuperBill Details Patient Name: Date of Service: Steve [name], [name] 05/06/2022 Medical Record Number: [id number] Patient Account Number: [id number] Date of Birth/Sex: Treating RN: 10-26-1940 (81 y.o. Steve [name] Primary Care Provider: Maudry Mayhew Other Clinician: Referring Provider: Treating Provider/Extender: Horris Latino in Treatment: 2 Diagnosis Coding ICD-10 Codes Code Description [phone] Non-pressure chronic ulcer of other part of left foot with fat layer exposed E11.621 Type 2 diabetes mellitus with foot ulcer I73.9 Peripheral vascular disease, unspecified N18.4 Chronic kidney disease, stage 4 (severe) I25.10 Atherosclerotic heart  disease of native coronary artery without angina pectoris I10 Essential (primary) hypertension Facility Procedures CPT4 Code: [id number] 1 I Description: 8469 - DEB SUBQ TISSUE 20 SQ CM/< CD-10 Diagnosis Description L97.522 Non-pressure chronic ulcer of other part of left foot with fat layer expo Modifier: sed Quantity: 1 Physician Procedures : CPT4 Code Description Modifier [id number] - WC PHYS LEVEL 3 - EST PT 25 ICD-10 Diagnosis Description L97.522 Non-pressure chronic ulcer of other  part of left foot with fat layer exposed E11.621 Type 2 diabetes mellitus with foot ulcer I73.9  Peripheral vascular disease, unspecified N18.4 Chronic kidney disease, stage 4 (severe) Quantity: 1 : [id number] - WC PHYS SUBQ TISS 20 SQ CM ICD-10 Diagnosis Description L97.522 Non-pressure chronic ulcer of other part of left foot with fat layer exposed Quantity: 1 Electronic Signature(s) Signed: 05/11/2022 1:26:34 PM By: Kristine Royal Signed: 05/11/2022 4:14:01 PM By: Fredirick Maudlin MD FACS Previous Signature: 05/06/2022 3:13:05 PM Version By: Fredirick Maudlin MD FACS Entered By: Kristine Royal on 05/11/2022 13:26:34

## 2022-05-20 ENCOUNTER — Encounter (HOSPITAL_BASED_OUTPATIENT_CLINIC_OR_DEPARTMENT_OTHER): Payer: Medicare Other | Admitting: General Surgery

## 2022-05-20 DIAGNOSIS — E11621 Type 2 diabetes mellitus with foot ulcer: Secondary | ICD-10-CM | POA: Diagnosis not present

## 2022-05-20 NOTE — Progress Notes (Signed)
SLADEN, PLANCARTE ([id number]) Visit Report for 05/20/2022 Arrival Information Details Patient Name: Date of Service: Steve NDPrentiss, Polio 05/20/2022 2:00 PM Medical Record Number: [id number] Patient Account Number: [id number] Date of Birth/Sex: Treating RN: [birth year] (81 y.o. Ernestene Mention Primary Care Leaira Fullam: Maudry Mayhew Other Clinician: Referring Korena Nass: Treating Maks Cavallero/Extender: Horris Latino in Treatment: 4 Visit Information History Since Last Visit Added or deleted any medications: No Patient Arrived: Gilford Rile Any new allergies or adverse reactions: No Arrival Time: 14:33 Had a fall or experienced change in No Accompanied By: self activities of daily living that may affect Transfer Assistance: None risk of falls: Patient Identification Verified: Yes Signs or symptoms of abuse/neglect since last visito No Secondary Verification Process Completed: Yes Hospitalized since last visit: No Patient Has Alerts: Yes Implantable device outside of the clinic excluding No Patient Alerts: Patient on Blood Thinner cellular tissue based products placed in the center R ABI = N/C TBI = .30 since last visit: L ABI = .57 TBI = .20 Has Dressing in Place as Prescribed: Yes Pain Present Now: Yes Electronic Signature(s) Signed: 05/20/2022 6:12:07 PM By: Baruch Gouty RN, BSN Entered By: Baruch Gouty on 05/20/2022 14:34:41 -------------------------------------------------------------------------------- Encounter Discharge Information Details Patient Name: Date of Service: Steve [name], [name] 05/20/2022 2:00 PM Medical Record Number: [id number] Patient Account Number: [id number] Date of Birth/Sex: Treating RN: [birth year] (81 y.o. Ernestene Mention Primary Care Jahmire Ruffins: Maudry Mayhew Other Clinician: Referring Vineet Kinney: Treating Macy Polio/Extender: Horris Latino in Treatment: 4 Encounter Discharge Information Items Post Procedure  Vitals Discharge Condition: Stable Temperature (F): 98.4 Ambulatory Status: Walker Pulse (bpm): 69 Discharge Destination: Home Respiratory Rate (breaths/min): 18 Transportation: Private Auto Blood Pressure (mmHg): 190/67 Accompanied By: self Schedule Follow-up Appointment: Yes Clinical Summary of Care: Patient Declined Electronic Signature(s) Signed: 05/20/2022 6:12:07 PM By: Baruch Gouty RN, BSN Entered By: Baruch Gouty on 05/20/2022 15:02:51 -------------------------------------------------------------------------------- Lower Extremity Assessment Details Patient Name: Date of Service: Steve [name], [name] 05/20/2022 2:00 PM Medical Record Number: [id number] Patient Account Number: [id number] Date of Birth/Sex: Treating RN: 21-Feb-1941 (81 y.o. Ernestene Mention Primary Care Renley Gutman: Maudry Mayhew Other Clinician: Referring Towana Stenglein: Treating Howie Rufus/Extender: Horris Latino in Treatment: 4 Edema Assessment Assessed: [Left: No] [Right: No] Edema: [Left: Ye] [Right: s] Calf Left: Right: Point of Measurement: From Medial Instep 34.5 cm Ankle Left: Right: Point of Measurement: From Medial Instep 22.5 cm Vascular Assessment Pulses: Dorsalis Pedis Palpable: [Left:No] Electronic Signature(s) Signed: 05/20/2022 6:12:07 PM By: Baruch Gouty RN, BSN Entered By: Baruch Gouty on 05/20/2022 14:37:01 -------------------------------------------------------------------------------- Multi Wound Chart Details Patient Name: Date of Service: Steve [name], [name] 05/20/2022 2:00 PM Medical Record Number: [id number] Patient Account Number: [id number] Date of Birth/Sex: Treating RN: 02/01/1941 (81 y.o. Ernestene Mention Primary Care Odalis Jordan: Maudry Mayhew Other Clinician: Referring Caeley Dohrmann: Treating Domitila Stetler/Extender: Horris Latino in Treatment: 4 Vital Signs Height(in): 41 Capillary Blood Glucose(mg/dl):  275 Weight(lbs): 195 Pulse(bpm): 35 Body Mass Index(BMI): 28.8 Blood Pressure(mmHg): 190/67 Temperature(F): 98.4 Respiratory Rate(breaths/min): 18 Photos: [1:Left Metatarsal head fifth] [N/A:N/A N/A] Wound Location: [1:Gradually Appeared] [N/A:N/A] Wounding Event: [1:Diabetic Wound/Ulcer of the Lower] [N/A:N/A] Primary Etiology: [1:Extremity Cataracts, Angina, Coronary Artery N/A] Comorbid History: [1:Disease, Hypertension, Peripheral Arterial Disease, Type II Diabetes, Osteoarthritis, Neuropathy 03/25/2022] [N/A:N/A] Date Acquired: [1:4] [N/A:N/A] Weeks of Treatment: [1:Open] [N/A:N/A] Wound Status: [1:No] [N/A:N/A] Wound Recurrence: [1:0.5x0.7x0.4] [N/A:N/A] Measurements L x W x D (cm) [1:0.275] [N/A:N/A] A (cm) : rea [1:0.11] [N/A:N/A] Volume (cm) : [1:-287.30%] [N/A:N/A] % Reduction  in A rea: [1:-292.90%] [N/A:N/A] % Reduction in Volume: [1:12] Starting Position 1 (o'clock): [1:12] Ending Position 1 (o'clock): [1:0.4] Maximum Distance 1 (cm): [1:Yes] [N/A:N/A] Undermining: [1:Grade 2] [N/A:N/A] Classification: [1:Medium] [N/A:N/A] Exudate A mount: [1:Serosanguineous] [N/A:N/A] Exudate Type: [1:red, brown] [N/A:N/A] Exudate Color: [1:Well defined, not attached] [N/A:N/A] Wound Margin: [1:Large (67-100%)] [N/A:N/A] Granulation A mount: [1:Red, Pale] [N/A:N/A] Granulation Quality: [1:None Present (0%)] [N/A:N/A] Necrotic A mount: [1:Fat Layer (Subcutaneous Tissue): Yes N/A] Exposed Structures: [1:Fascia: No Tendon: No Muscle: No Joint: No Bone: No None] [N/A:N/A] Treatment Notes Electronic Signature(s) Signed: 05/20/2022 2:47:55 PM By: Fredirick Maudlin MD FACS Signed: 05/20/2022 6:12:07 PM By: Baruch Gouty RN, BSN Entered By: Fredirick Maudlin on 05/20/2022 14:47:54 -------------------------------------------------------------------------------- Multi-Disciplinary Care Plan Details Patient Name: Date of Service: Steve [name], [name] 05/20/2022 2:00 PM Medical Record  Number: [id number] Patient Account Number: [id number] Date of Birth/Sex: Treating RN: 1941-09-16 (81 y.o. Ernestene Mention Primary Care Allien Melberg: Maudry Mayhew Other Clinician: Referring Kato Wieczorek: Treating Elita Dame/Extender: Horris Latino in Treatment: [place] reviewed with physician Active Inactive Nutrition Nursing Diagnoses: Impaired glucose control: actual or potential Potential for alteratiion in Nutrition/Potential for imbalanced nutrition Goals: Patient/caregiver will maintain therapeutic glucose control Date Initiated: 04/20/2022 Target Resolution Date: 06/15/2022 Goal Status: Active Interventions: Assess HgA1c results as ordered upon admission and as needed Assess patient nutrition upon admission and as needed per policy Treatment Activities: Giving encouragement to exercise : 04/20/2022 Patient referred to Primary Care Physician for further nutritional evaluation : 04/20/2022 Notes: Wound/Skin Impairment Nursing Diagnoses: Impaired tissue integrity Knowledge deficit related to ulceration/compromised skin integrity Goals: Patient/caregiver will verbalize understanding of skin care regimen Date Initiated: 04/20/2022 Target Resolution Date: 06/15/2022 Goal Status: Active Ulcer/skin breakdown will have a volume reduction of 30% by week 4 Date Initiated: 04/20/2022 Date Inactivated: 05/20/2022 Target Resolution Date: 05/18/2022 Goal Status: Unmet Unmet Reason: difficulty offloading Interventions: Assess patient/caregiver ability to obtain necessary supplies Assess patient/caregiver ability to perform ulcer/skin care regimen upon admission and as needed Assess ulceration(s) every visit Provide education on ulcer and skin care Treatment Activities: Skin care regimen initiated : 04/20/2022 Topical wound management initiated : 04/20/2022 Notes: Electronic Signature(s) Signed: 05/20/2022 6:12:07 PM By: Baruch Gouty RN,  BSN Entered By: Baruch Gouty on 05/20/2022 14:42:46 -------------------------------------------------------------------------------- Pain Assessment Details Patient Name: Date of Service: Steve [name], [name] 05/20/2022 2:00 PM Medical Record Number: [id number] Patient Account Number: [id number] Date of Birth/Sex: Treating RN: [birth year] (81 y.o. Ernestene Mention Primary Care Aza Dantes: Maudry Mayhew Other Clinician: Referring Margarete Horace: Treating Norvin Ohlin/Extender: Horris Latino in Treatment: 4 Active Problems Location of Pain Severity and Description of Pain Patient Has Paino Yes Site Locations Pain Location: Pain Location: Pain in Ulcers With Dressing Change: No Duration of the Pain. Constant / Intermittento Intermittent Rate the pain. Current Pain Level: 3 Worst Pain Level: 4 Least Pain Level: 0 Character of Pain Describe the Pain: Burning Pain Management and Medication Current Pain Management: Other: tolerable Is the Current Pain Management Adequate: Adequate How does your wound impact your activities of daily livingo Sleep: No Bathing: No Appetite: No Relationship With Others: No Bladder Continence: No Emotions: No Bowel Continence: No Work: No Toileting: No Drive: No Dressing: No Hobbies: No Electronic Signature(s) Signed: 05/20/2022 6:12:07 PM By: Baruch Gouty RN, BSN Entered By: Baruch Gouty on 05/20/2022 14:36:25 -------------------------------------------------------------------------------- Patient/Caregiver Education Details Patient Name: Date of Service: Steve [name], Steve [name] 7/27/2023andnbsp2:00 PM Medical Record Number: [id number] Patient Account Number: [id number] Date of Birth/Gender: Treating RN: 1941/02/04 (81  y.o. Ernestene Mention Primary Care Physician: Maudry Mayhew Other Clinician: Referring Physician: Treating Physician/Extender: Horris Latino in Treatment: 4 Education  Assessment Education Provided To: Patient Education Topics Provided Offloading: Methods: Explain/Verbal Responses: Reinforcements needed, State content correctly Wound/Skin Impairment: Methods: Explain/Verbal Responses: Reinforcements needed, State content correctly Electronic Signature(s) Signed: 05/20/2022 6:12:07 PM By: Baruch Gouty RN, BSN Signed: 05/20/2022 6:12:07 PM By: Baruch Gouty RN, BSN Entered By: Baruch Gouty on 05/20/2022 14:43:22 -------------------------------------------------------------------------------- Wound Assessment Details Patient Name: Date of Service: Steve [name], [name] 05/20/2022 2:00 PM Medical Record Number: [id number] Patient Account Number: [id number] Date of Birth/Sex: Treating RN: 09-Jul-1941 (81 y.o. Ernestene Mention Primary Care Willie Loy: Maudry Mayhew Other Clinician: Referring Bayler Nehring: Treating Warda Mcqueary/Extender: Horris Latino in Treatment: 4 Wound Status Wound Number: 1 Primary Diabetic Wound/Ulcer of the Lower Extremity Etiology: Wound Location: Left Metatarsal head fifth Wound Open Wounding Event: Gradually Appeared Status: Date Acquired: 03/25/2022 Comorbid Cataracts, Angina, Coronary Artery Disease, Hypertension, Weeks Of Treatment: 4 History: Peripheral Arterial Disease, Type II Diabetes, Osteoarthritis, Clustered Wound: No Neuropathy Photos Wound Measurements Length: (cm) 0.5 Width: (cm) 0.7 Depth: (cm) 0.4 Area: (cm) 0.275 Volume: (cm) 0.11 % Reduction in Area: -287.3% % Reduction in Volume: -292.9% Epithelialization: None Tunneling: No Undermining: Yes Starting Position (o'clock): 12 Ending Position (o'clock): 12 Maximum Distance: (cm) 0.4 Wound Description Classification: Grade 2 Wound Margin: Well defined, not attached Exudate Amount: Medium Exudate Type: Serosanguineous Exudate Color: red, brown Foul Odor After Cleansing: No Slough/Fibrino No Wound Bed Granulation Amount:  Large (67-100%) Exposed Structure Granulation Quality: Red, Pale Fascia Exposed: No Necrotic Amount: None Present (0%) Fat Layer (Subcutaneous Tissue) Exposed: Yes Tendon Exposed: No Muscle Exposed: No Joint Exposed: No Bone Exposed: No Treatment Notes Wound #1 (Metatarsal head fifth) Wound Laterality: Left Cleanser Peri-Wound Care Topical Primary Dressing KerraCel Ag Gelling Fiber Dressing, 2x2 in (silver alginate) Discharge Instruction: Apply silver alginate to wound bed . be sure to place into wound bed Secondary Dressing Optifoam Non-Adhesive Dressing, 4x4 in Discharge Instruction: Apply over primary dressing cut to make foam donut Woven Gauze Sponges 2x2 in Discharge Instruction: Apply over primary dressing as directed. Secured With 41M Medipore H Soft Cloth Surgical T ape, 4 x 10 (in/yd) Discharge Instruction: Secure with tape as directed. Compression Wrap Compression Stockings Add-Ons Electronic Signature(s) Signed: 05/20/2022 6:12:07 PM By: Baruch Gouty RN, BSN Entered By: Baruch Gouty on 05/20/2022 14:40:22 -------------------------------------------------------------------------------- Vitals Details Patient Name: Date of Service: Steve [name], [name] 05/20/2022 2:00 PM Medical Record Number: [id number] Patient Account Number: [id number] Date of Birth/Sex: Treating RN: 08-Dec-1940 (81 y.o. Ernestene Mention Primary Care Graiden Henes: Maudry Mayhew Other Clinician: Referring Kennia Vanvorst: Treating Gurdeep Keesey/Extender: Horris Latino in Treatment: 4 Vital Signs Time Taken: 14:15 Temperature (F): 98.4 Height (in): 69 Pulse (bpm): 69 Weight (lbs): 195 Respiratory Rate (breaths/min): 18 Body Mass Index (BMI): 28.8 Blood Pressure (mmHg): 190/67 Capillary Blood Glucose (mg/dl): 275 Reference Range: 80 - 120 mg / dl Notes glucose per pt report this am Electronic Signature(s) Signed: 05/20/2022 6:12:07 PM By: Baruch Gouty RN, BSN Entered  By: Baruch Gouty on 05/20/2022 14:35:34

## 2022-05-20 NOTE — Progress Notes (Addendum)
Steve [name], Steve [name] ([id number]) Visit Report for 05/20/2022 Chief Complaint Document Details Patient Name: Date of Service: BO NDJamere, [name] 05/20/2022 2:00 PM Medical Record Number: [id number] Patient Account Number: [id number] Date of Birth/Sex: Treating RN: [birth year] (81 y.o. Ernestene Mention Primary Care Provider: Maudry Mayhew Other Clinician: Referring Provider: Treating Provider/Extender: Horris Latino in Treatment: 4 Information Obtained from: Patient Chief Complaint Patients presents for treatment of an open diabetic ulcer Electronic Signature(s) Signed: 05/20/2022 2:48:04 PM By: Fredirick Maudlin MD FACS Entered By: Fredirick Maudlin on 05/20/2022 14:48:04 -------------------------------------------------------------------------------- Debridement Details Patient Name: Date of Service: [name], [name] 05/20/2022 2:00 PM Medical Record Number: [id number] Patient Account Number: [id number] Date of Birth/Sex: Treating RN: 11/06/1940 (81 y.o. Ernestene Mention Primary Care Provider: Maudry Mayhew Other Clinician: Referring Provider: Treating Provider/Extender: Horris Latino in Treatment: 4 Debridement Performed for Assessment: Wound #1 Left Metatarsal head fifth Performed By: Physician Fredirick Maudlin, MD Debridement Type: Debridement Severity of Tissue Pre Debridement: Fat layer exposed Level of Consciousness (Pre-procedure): Awake and Alert Pre-procedure Verification/Time Out Yes - 14:45 Taken: Start Time: 14:48 Pain Control: Lidocaine 4% T opical Solution T Area Debrided (L x W): otal 0.7 (cm) x 0.7 (cm) = 0.49 (cm) Tissue and other material debrided: Viable, Non-Viable, Callus, Slough, Subcutaneous, Skin: Epidermis, Slough Level: Skin/Subcutaneous Tissue Debridement Description: Excisional Instrument: Curette Bleeding: Minimum Hemostasis Achieved: Pressure Procedural Pain: 0 Post Procedural Pain: 0 Response to  Treatment: Procedure was tolerated well Level of Consciousness (Post- Awake and Alert procedure): Post Debridement Measurements of Total Wound Length: (cm) 0.7 Width: (cm) 0.7 Depth: (cm) 0.3 Volume: (cm) 0.115 Character of Wound/Ulcer Post Debridement: Improved Severity of Tissue Post Debridement: Fat layer exposed Post Procedure Diagnosis Same as Pre-procedure Electronic Signature(s) Signed: 05/20/2022 3:45:10 PM By: Fredirick Maudlin MD FACS Signed: 05/20/2022 6:12:07 PM By: Baruch Gouty RN, BSN Entered By: Baruch Gouty on 05/20/2022 14:51:46 -------------------------------------------------------------------------------- HPI Details Patient Name: Date of Service: [name], [name] 05/20/2022 2:00 PM Medical Record Number: [id number] Patient Account Number: [id number] Date of Birth/Sex: Treating RN: 1941-06-12 (81 y.o. Ernestene Mention Primary Care Provider: Maudry Mayhew Other Clinician: Referring Provider: Treating Provider/Extender: Horris Latino in Treatment: 4 History of Present Illness HPI Description: ADMISSION 04/20/2022 This is an 81 year old man with a past medical history significant for poorly controlled type 2 diabetes mellitus, coronary artery disease, peripheral vascular disease, stage IV chronic kidney disease, and hypertension. He has an open ulcer on the plantar surface of his left fifth metatarsal head. It apparently just gradually appeared in the site where he had a callus. Last hemoglobin A1c that I was able to find in the electronic medical record was from 2020 and was 10.8. Formal vascular studies were done in March 2023 and the results are copied here: +-------+-----------+-----------+------------+------------+ ABI/TBIT oday's ABIT oday's TBIPrevious ABIPrevious TBI +-------+-----------+-----------+------------+------------+ Right [place] 0.30 [place] 0.46   +-------+-----------+-----------+------------+------------+ Left 0.57 0.20 0.66 0.23  +-------+-----------+-----------+------------+------------+ Summary: Right: Resting right ankle-brachial index indicates noncompressible right lower extremity arteries. The right toe-brachial index is abnormal. Left: Resting left ankle-brachial index indicates moderate left lower extremity arterial disease; however, indices may be overestimated due to the presence of calcified vessels. The toe-index is abnormal. The wound is fairly small, circular, and has a clean base. There is some periwound callus around the periphery. No significant undermining. Light layer of slough/biofilm on the surface. 04/28/2022: The wound measured a little bit larger today but this might be secondary to the debridement that I  performed last week. The callus around the wound is macerated and starting to fall off of its own accord. The base of the wound itself is fairly clean with just a small amount of slough. No erythema, induration, or purulent drainage. 05/06/2022: The wound is basically unchanged from last week. There is a little bit of undermining around the perimeter but the surface is clean with just a little bit of slough buildup. 05/20/2022: 2-week follow-up. There is callus accumulation around the wound orifice with some slough buildup at the wound bed. It does not appear that the home health nurses are putting the silver alginate into the wound in contact with the wound surface. No concern for infection. Electronic Signature(s) Signed: 05/20/2022 2:52:37 PM By: Fredirick Maudlin MD FACS Entered By: Fredirick Maudlin on 05/20/2022 14:52:37 -------------------------------------------------------------------------------- Physical Exam Details Patient Name: Date of Service: [name], [name] 05/20/2022 2:00 PM Medical Record Number: [id number] Patient Account Number: [id number] Date of Birth/Sex: Treating RN: [birth year] (81  y.o. Ernestene Mention Primary Care Provider: Maudry Mayhew Other Clinician: Referring Provider: Treating Provider/Extender: Horris Latino in Treatment: 4 Constitutional Hypertensive, asymptomatic. . . . No acute distress.Marland Kitchen Respiratory Normal work of breathing on room air.. Notes 05/20/2022: There is callus accumulation around the wound orifice with some slough buildup at the wound bed. It does not appear that the home health nurses are putting the silver alginate into the wound in contact with the wound surface. No concern for infection. Electronic Signature(s) Signed: 05/20/2022 2:53:08 PM By: Fredirick Maudlin MD FACS Entered By: Fredirick Maudlin on 05/20/2022 14:53:08 -------------------------------------------------------------------------------- Physician Orders Details Patient Name: Date of Service: [name], Duvall C. 05/20/2022 2:00 PM Medical Record Number: [id number] Patient Account Number: [id number] Date of Birth/Sex: Treating RN: 1941/09/17 (81 y.o. Ernestene Mention Primary Care Provider: Maudry Mayhew Other Clinician: Referring Provider: Treating Provider/Extender: Horris Latino in Treatment: 4 Verbal / Phone Orders: No Diagnosis Coding ICD-10 Coding Code Description [phone] Non-pressure chronic ulcer of other part of left foot with fat layer exposed E11.621 Type 2 diabetes mellitus with foot ulcer I73.9 Peripheral vascular disease, unspecified N18.4 Chronic kidney disease, stage 4 (severe) I25.10 Atherosclerotic heart disease of native coronary artery without angina pectoris I10 Essential (primary) hypertension Follow-up Appointments ppointment in 2 weeks. - Dr. Celine Ahr RM 1 with [facility] Return A Thursday 8/10 @ 2:00 pm Bathing/ Shower/ Hygiene May shower and wash wound with soap and water. - with dressing changes Home Health Dressing changes to be completed by [place] on Monday / Wednesday / Friday  except when patient has scheduled visit at [facility]. Other Home Health Orders/Instructions: - [facility] Wound Treatment Wound #1 - Metatarsal head fifth Wound Laterality: Left Prim Dressing: KerraCel Ag Gelling Fiber Dressing, 2x2 in (silver alginate) 3 x Per Week/30 Days ary Discharge Instructions: Apply silver alginate to wound bed . be sure to place into wound bed Secondary Dressing: Optifoam Non-Adhesive Dressing, 4x4 in 3 x Per Week/30 Days Discharge Instructions: Apply over primary dressing cut to make foam donut Secondary Dressing: Woven Gauze Sponges 2x2 in 3 x Per Week/30 Days Discharge Instructions: Apply over primary dressing as directed. Secured With: 50M Medipore H Soft Cloth Surgical Tape, 4 x 10 (in/yd) 3 x Per Week/30 Days Discharge Instructions: Secure with tape as directed. Patient Medications llergies: Januvia, Statins-HMG-CoA Reductase Inhibitors A Notifications Medication Indication Start End prior to debridement 05/20/2022 lidocaine DOSE topical 4 % cream - cream topical Electronic Signature(s) Signed: 05/20/2022 3:45:10 PM By:  Fredirick Maudlin MD FACS Signed: 05/20/2022 6:12:07 PM By: Baruch Gouty RN, BSN Previous Signature: 05/20/2022 2:53:43 PM Version By: Fredirick Maudlin MD FACS Entered By: Baruch Gouty on 05/20/2022 14:55:44 -------------------------------------------------------------------------------- Problem List Details Patient Name: Date of Service: [name], Rigoberto C. 05/20/2022 2:00 PM Medical Record Number: [id number] Patient Account Number: [id number] Date of Birth/Sex: Treating RN: 01/29/1941 (81 y.o. Ernestene Mention Primary Care Provider: Maudry Mayhew Other Clinician: Referring Provider: Treating Provider/Extender: Horris Latino in Treatment: 4 Active Problems ICD-10 Encounter Code Description Active Date MDM Diagnosis [phone] Non-pressure chronic ulcer of other part of left foot with fat layer  exposed 04/20/2022 No Yes E11.621 Type 2 diabetes mellitus with foot ulcer 04/20/2022 No Yes I73.9 Peripheral vascular disease, unspecified 04/20/2022 No Yes N18.4 Chronic kidney disease, stage 4 (severe) 04/20/2022 No Yes I25.10 Atherosclerotic heart disease of native coronary artery without angina pectoris 04/20/2022 No Yes I10 Essential (primary) hypertension 04/20/2022 No Yes Inactive Problems Resolved Problems Electronic Signature(s) Signed: 05/20/2022 2:47:46 PM By: Fredirick Maudlin MD FACS Entered By: Fredirick Maudlin on 05/20/2022 14:47:46 -------------------------------------------------------------------------------- Progress Note Details Patient Name: Date of Service: [name], Carole C. 05/20/2022 2:00 PM Medical Record Number: [id number] Patient Account Number: [id number] Date of Birth/Sex: Treating RN: December 15, 1940 (81 y.o. Ernestene Mention Primary Care Provider: Maudry Mayhew Other Clinician: Referring Provider: Treating Provider/Extender: Horris Latino in Treatment: 4 Subjective Chief Complaint Information obtained from Patient Patients presents for treatment of an open diabetic ulcer History of Present Illness (HPI) ADMISSION 04/20/2022 This is an 81 year old man with a past medical history significant for poorly controlled type 2 diabetes mellitus, coronary artery disease, peripheral vascular disease, stage IV chronic kidney disease, and hypertension. He has an open ulcer on the plantar surface of his left fifth metatarsal head. It apparently just gradually appeared in the site where he had a callus. Last hemoglobin A1c that I was able to find in the electronic medical record was from 2020 and was 10.8. Formal vascular studies were done in March 2023 and the results are copied here: +-------+-----------+-----------+------------+------------+ ABI/TBIT oday's ABIT oday's TBIPrevious ABIPrevious  TBI +-------+-----------+-----------+------------+------------+ Right [place] 0.30 [place] 0.46  +-------+-----------+-----------+------------+------------+ Left 0.57 0.20 0.66 0.23  +-------+-----------+-----------+------------+------------+ Summary: Right: Resting right ankle-brachial index indicates noncompressible right lower extremity arteries. The right toe-brachial index is abnormal. Left: Resting left ankle-brachial index indicates moderate left lower extremity arterial disease; however, indices may be overestimated due to the presence of calcified vessels. The toe-index is abnormal. The wound is fairly small, circular, and has a clean base. There is some periwound callus around the periphery. No significant undermining. Light layer of slough/biofilm on the surface. 04/28/2022: The wound measured a little bit larger today but this might be secondary to the debridement that I performed last week. The callus around the wound is macerated and starting to fall off of its own accord. The base of the wound itself is fairly clean with just a small amount of slough. No erythema, induration, or purulent drainage. 05/06/2022: The wound is basically unchanged from last week. There is a little bit of undermining around the perimeter but the surface is clean with just a little bit of slough buildup. 05/20/2022: 2-week follow-up. There is callus accumulation around the wound orifice with some slough buildup at the wound bed. It does not appear that the home health nurses are putting the silver alginate into the wound in contact with the wound surface. No concern for infection. Patient History Information obtained from Patient, Chart. Family  History Heart Disease - Father, Stroke - Mother, No family history of Cancer, Diabetes, Hereditary Spherocytosis, Hypertension, Kidney Disease, Lung Disease, Seizures, Thyroid Problems, Tuberculosis. Social History Former smoker - quit 60 yrs ago, Marital  Status - Widowed, Alcohol Use - Rarely, Drug Use - No History, Caffeine Use - Rarely. Medical History Eyes Patient has history of Cataracts - right removed Denies history of Glaucoma, Optic Neuritis Cardiovascular Patient has history of Angina - afib, Coronary Artery Disease, Hypertension, Peripheral Arterial Disease Endocrine Patient has history of Type II Diabetes Denies history of Type I Diabetes Genitourinary Denies history of End Stage Renal Disease Integumentary (Skin) Denies history of History of Burn Musculoskeletal Patient has history of Osteoarthritis - hands Neurologic Patient has history of Neuropathy Oncologic Denies history of Received Chemotherapy, Received Radiation Psychiatric Denies history of Anorexia/bulimia, Confinement Anxiety Hospitalization/Surgery History - CABG 3 vessel. - cholecystectomy. Medical A Surgical History Notes nd Cardiovascular ischemic cardiomyopathy, hyperlipidemia Genitourinary CKD stage 4 Oncologic skin CA removed Objective Constitutional Hypertensive, asymptomatic. No acute distress.. Vitals Time Taken: 2:15 PM, Height: 69 in, Weight: 195 lbs, BMI: 28.8, Temperature: 98.4 F, Pulse: 69 bpm, Respiratory Rate: 18 breaths/min, Blood Pressure: 190/67 mmHg, Capillary Blood Glucose: 275 mg/dl. General Notes: glucose per pt report this am Respiratory Normal work of breathing on room air.. General Notes: 05/20/2022: There is callus accumulation around the wound orifice with some slough buildup at the wound bed. It does not appear that the home health nurses are putting the silver alginate into the wound in contact with the wound surface. No concern for infection. Integumentary (Hair, Skin) Wound #1 status is Open. Original cause of wound was Gradually Appeared. The date acquired was: 03/25/2022. The wound has been in treatment 4 weeks. The wound is located on the Left Metatarsal head fifth. The wound measures 0.5cm length x 0.7cm width x  0.4cm depth; 0.275cm^2 area and 0.11cm^3 volume. There is Fat Layer (Subcutaneous Tissue) exposed. There is no tunneling noted, however, there is undermining starting at 12:00 and ending at 12:00 with a maximum distance of 0.4cm. There is a medium amount of serosanguineous drainage noted. The wound margin is well defined and not attached to the wound base. There is large (67-100%) red, pale granulation within the wound bed. There is no necrotic tissue within the wound bed. Assessment Active Problems ICD-10 Non-pressure chronic ulcer of other part of left foot with fat layer exposed Type 2 diabetes mellitus with foot ulcer Peripheral vascular disease, unspecified Chronic kidney disease, stage 4 (severe) Atherosclerotic heart disease of native coronary artery without angina pectoris Essential (primary) hypertension Procedures Wound #1 Pre-procedure diagnosis of Wound #1 is a Diabetic Wound/Ulcer of the Lower Extremity located on the Left Metatarsal head fifth .Severity of Tissue Pre Debridement is: Fat layer exposed. There was a Excisional Skin/Subcutaneous Tissue Debridement with a total area of 0.49 sq cm performed by Fredirick Maudlin, MD. With the following instrument(s): Curette to remove Viable and Non-Viable tissue/material. Material removed includes Callus, Subcutaneous Tissue, Slough, and Skin: Epidermis after achieving pain control using Lidocaine 4% Topical Solution. No specimens were taken. A time out was conducted at 14:45, prior to the start of the procedure. A Minimum amount of bleeding was controlled with Pressure. The procedure was tolerated well with a pain level of 0 throughout and a pain level of 0 following the procedure. Post Debridement Measurements: 0.7cm length x 0.7cm width x 0.3cm depth; 0.115cm^3 volume. Character of Wound/Ulcer Post Debridement is improved. Severity of Tissue Post Debridement is: Fat  layer exposed. Post procedure Diagnosis Wound #1: Same as  Pre-Procedure Plan 05/20/2022: 2-week follow-up. There is callus accumulation around the wound orifice with some slough buildup at the wound bed. It does not appear that the home health nurses are putting the silver alginate into the wound in contact with the wound surface. No concern for infection. I used a curette to debride callus from around the wound and then debrided the slough and nonviable subcutaneous tissue from the wound itself. We will make it clear in the home health orders that the silver alginate needs to be placed into the wound so that it can contact the surface. He will follow-up in 2 weeks. Electronic Signature(s) Signed: 05/20/2022 2:54:17 PM By: Fredirick Maudlin MD FACS Entered By: Fredirick Maudlin on 05/20/2022 14:54:17 -------------------------------------------------------------------------------- HxROS Details Patient Name: Date of Service: [name], Lawsen C. 05/20/2022 2:00 PM Medical Record Number: [id number] Patient Account Number: [id number] Date of Birth/Sex: Treating RN: [birth year] (81 y.o. Ernestene Mention Primary Care Provider: Maudry Mayhew Other Clinician: Referring Provider: Treating Provider/Extender: Horris Latino in Treatment: 4 Information Obtained From Patient Chart Eyes Medical History: Positive for: Cataracts - right removed Negative for: Glaucoma; Optic Neuritis Cardiovascular Medical History: Positive for: Angina - afib; Coronary Artery Disease; Hypertension; Peripheral Arterial Disease Past Medical History Notes: ischemic cardiomyopathy, hyperlipidemia Endocrine Medical History: Positive for: Type II Diabetes Negative for: Type I Diabetes Time with diabetes: since 1999 Treated with: Insulin, Oral agents Blood sugar tested every day: Yes Tested : once Genitourinary Medical History: Negative for: End Stage Renal Disease Past Medical History Notes: CKD stage 4 Integumentary (Skin) Medical  History: Negative for: History of Burn Musculoskeletal Medical History: Positive for: Osteoarthritis - hands Neurologic Medical History: Positive for: Neuropathy Oncologic Medical History: Negative for: Received Chemotherapy; Received Radiation Past Medical History Notes: skin CA removed Psychiatric Medical History: Negative for: Anorexia/bulimia; Confinement Anxiety HBO Extended History Items Eyes: Cataracts Immunizations Pneumococcal Vaccine: Received Pneumococcal Vaccination: Yes Received Pneumococcal Vaccination On or After 60th Birthday: Yes Implantable Devices No devices added Hospitalization / Surgery History Type of Hospitalization/Surgery CABG 3 vessel cholecystectomy Family and Social History Cancer: No; Diabetes: No; Heart Disease: Yes - Father; Hereditary Spherocytosis: No; Hypertension: No; Kidney Disease: No; Lung Disease: No; Seizures: No; Stroke: Yes - Mother; Thyroid Problems: No; Tuberculosis: No; Former smoker - quit 60 yrs ago; Marital Status - Widowed; Alcohol Use: Rarely; Drug Use: No History; Caffeine Use: Rarely; Financial Concerns: No; Food, Clothing or Shelter Needs: No; Support System Lacking: No; Transportation Concerns: No Engineer, maintenance) Signed: 05/20/2022 3:45:10 PM By: Fredirick Maudlin MD FACS Signed: 05/20/2022 6:12:07 PM By: Baruch Gouty RN, BSN Entered By: Fredirick Maudlin on 05/20/2022 14:52:42 -------------------------------------------------------------------------------- SuperBill Details Patient Name: Date of Service: [name], Rahmel C. 05/20/2022 Medical Record Number: [id number] Patient Account Number: [id number] Date of Birth/Sex: Treating RN: 1940-11-08 (81 y.o. Ernestene Mention Primary Care Provider: Maudry Mayhew Other Clinician: Referring Provider: Treating Provider/Extender: Horris Latino in Treatment: 4 Diagnosis Coding ICD-10 Codes Code Description [phone] Non-pressure chronic  ulcer of other part of left foot with fat layer exposed E11.621 Type 2 diabetes mellitus with foot ulcer I73.9 Peripheral vascular disease, unspecified N18.4 Chronic kidney disease, stage 4 (severe) I25.10 Atherosclerotic heart disease of native coronary artery without angina pectoris I10 Essential (primary) hypertension Facility Procedures CPT4 Code: [id number] 1 I Description: 1856 - DEB SUBQ TISSUE 20 SQ CM/< CD-10 Diagnosis Description L97.522 Non-pressure chronic ulcer of other part of left foot with fat layer  expos Modifier: 1 ed Quantity: Physician Procedures : CPT4 Code Description Modifier [id number] - WC PHYS LEVEL 3 - EST PT 25 ICD-10 Diagnosis Description L97.522 Non-pressure chronic ulcer of other part of left foot with fat layer exposed E11.621 Type 2 diabetes mellitus with foot ulcer I73.9  Peripheral vascular disease, unspecified I25.10 Atherosclerotic heart disease of native coronary artery without angina pectoris Quantity: 1 : [id number] - WC PHYS SUBQ TISS 20 SQ CM ICD-10 Diagnosis Description L97.522 Non-pressure chronic ulcer of other part of left foot with fat layer exposed Quantity: 1 Electronic Signature(s) Signed: 05/20/2022 2:55:48 PM By: Fredirick Maudlin MD FACS Entered By: Fredirick Maudlin on 05/20/2022 14:55:48

## 2022-06-03 ENCOUNTER — Encounter (HOSPITAL_BASED_OUTPATIENT_CLINIC_OR_DEPARTMENT_OTHER): Payer: Medicare Other | Attending: General Surgery | Admitting: General Surgery

## 2022-06-03 DIAGNOSIS — L97525 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 without evidence of necrosis: Secondary | ICD-10-CM | POA: Insufficient documentation

## 2022-06-03 DIAGNOSIS — I2510 Atherosclerotic heart disease of native coronary artery without angina pectoris: Secondary | ICD-10-CM | POA: Insufficient documentation

## 2022-06-03 DIAGNOSIS — N184 Chronic kidney disease, stage 4 (severe): Secondary | ICD-10-CM | POA: Insufficient documentation

## 2022-06-03 DIAGNOSIS — E1165 Type 2 diabetes mellitus with hyperglycemia: Secondary | ICD-10-CM | POA: Insufficient documentation

## 2022-06-03 DIAGNOSIS — E1122 Type 2 diabetes mellitus with diabetic chronic kidney disease: Secondary | ICD-10-CM | POA: Diagnosis not present

## 2022-06-03 DIAGNOSIS — E1151 Type 2 diabetes mellitus with diabetic peripheral angiopathy without gangrene: Secondary | ICD-10-CM | POA: Insufficient documentation

## 2022-06-03 DIAGNOSIS — I129 Hypertensive chronic kidney disease with stage 1 through stage 4 chronic kidney disease, or unspecified chronic kidney disease: Secondary | ICD-10-CM | POA: Insufficient documentation

## 2022-06-03 DIAGNOSIS — L97522 Non-pressure chronic ulcer of other part of left foot with fat layer exposed: Secondary | ICD-10-CM | POA: Diagnosis not present

## 2022-06-03 DIAGNOSIS — E11621 Type 2 diabetes mellitus with foot ulcer: Secondary | ICD-10-CM | POA: Insufficient documentation

## 2022-06-03 NOTE — Progress Notes (Signed)
Steve [name], Steve [name] ([id number]) Visit Report for 06/03/2022 Arrival Information Details Patient Name: Date of Service: BO NDQuinterrius, Steve 06/03/2022 2:00 PM Medical Record Number: [id number] Patient Account Number: [id number] Date of Birth/Sex: Treating RN: November 06, 1940 (81 y.o. Male) Baruch Gouty Primary Care Merlin Ege: Maudry Mayhew Other Clinician: Referring Wilmore Holsomback: Treating Yalitza Teed/Extender: Horris Latino in Treatment: 6 Visit Information History Since Last Visit Added or deleted any medications: No Patient Arrived: Gilford Rile Any new allergies or adverse reactions: No Arrival Time: 14:07 Had a fall or experienced change in No Accompanied By: self activities of daily living that may affect Transfer Assistance: None risk of falls: Patient Identification Verified: Yes Signs or symptoms of abuse/neglect since last visito No Secondary Verification Process Completed: Yes Hospitalized since last visit: No Patient Has Alerts: Yes Implantable device outside of the clinic excluding No Patient Alerts: Patient on Blood Thinner cellular tissue based products placed in the center R ABI = N/C TBI = .30 since last visit: L ABI = .57 TBI = .20 Has Dressing in Place as Prescribed: Yes Pain Present Now: No Electronic Signature(s) Signed: 06/03/2022 5:20:00 PM By: Baruch Gouty RN, BSN Entered By: Baruch Gouty on 06/03/2022 14:10:35 -------------------------------------------------------------------------------- Lower Extremity Assessment Details Patient Name: Date of Service: [name], [name] 06/03/2022 2:00 PM Medical Record Number: [id number] Patient Account Number: [id number] Date of Birth/Sex: Treating RN: Apr 11, 1941 (81 y.o. Male) Baruch Gouty Primary Care Nicolina Hirt: Maudry Mayhew Other Clinician: Referring Jadyn Barge: Treating Dejanay Wamboldt/Extender: Horris Latino in Treatment: 6 Edema Assessment Assessed: [Left: No] [Right:  No] Edema: [Left: Ye] [Right: s] Calf Left: Right: Point of Measurement: From Medial Instep 35 cm Ankle Left: Right: Point of Measurement: From Medial Instep 24.5 cm Vascular Assessment Pulses: Dorsalis Pedis Palpable: [Left:No] Electronic Signature(s) Signed: 06/03/2022 5:20:00 PM By: Baruch Gouty RN, BSN Entered By: Baruch Gouty on 06/03/2022 14:13:21 -------------------------------------------------------------------------------- Multi Wound Chart Details Patient Name: Date of Service: [name], [name] 06/03/2022 2:00 PM Medical Record Number: [id number] Patient Account Number: [id number] Date of Birth/Sex: Treating RN: 1941-10-19 (81 y.o. Male) Baruch Gouty Primary Care Aurorah Schlachter: Maudry Mayhew Other Clinician: Referring Marlana Mckowen: Treating Bryttany Tortorelli/Extender: Horris Latino in Treatment: 6 Vital Signs Height(in): 76 Capillary Blood Glucose(mg/dl): 215 Weight(lbs): 195 Pulse(bpm): 68 Body Mass Index(BMI): 28.8 Blood Pressure(mmHg): 176/76 Temperature(F): 98 Respiratory Rate(breaths/min): 18 Photos: [N/A:N/A] Left Metatarsal head fifth N/A N/A Wound Location: Gradually Appeared N/A N/A Wounding Event: Diabetic Wound/Ulcer of the Lower N/A N/A Primary Etiology: Extremity Cataracts, Angina, Coronary Artery N/A N/A Comorbid History: Disease, Hypertension, Peripheral Arterial Disease, Type II Diabetes, Osteoarthritis, Neuropathy 03/25/2022 N/A N/A Date Acquired: 6 N/A N/A Weeks of Treatment: Open N/A N/A Wound Status: No N/A N/A Wound Recurrence: 0.5x0.7x0.3 N/A N/A Measurements L x W x D (cm) 0.275 N/A N/A A (cm) : rea 0.082 N/A N/A Volume (cm) : -287.30% N/A N/A % Reduction in A rea: -192.90% N/A N/A % Reduction in Volume: 3 Starting Position 1 (o'clock): 7 Ending Position 1 (o'clock): 0.2 Maximum Distance 1 (cm): Yes N/A N/A Undermining: Grade 2 N/A N/A Classification: Medium N/A N/A Exudate A  mount: Serosanguineous N/A N/A Exudate Type: red, brown N/A N/A Exudate Color: Well defined, not attached N/A N/A Wound Margin: Large (67-100%) N/A N/A Granulation A mount: Red, Pale N/A N/A Granulation Quality: None Present (0%) N/A N/A Necrotic A mount: Fat Layer (Subcutaneous Tissue): Yes N/A N/A Exposed Structures: Fascia: No Tendon: No Muscle: No Joint: No Bone: No None N/A N/A Epithelialization: Debridement - Selective/Open Wound  N/A N/A Debridement: Pre-procedure Verification/Time Out 14:25 N/A N/A Taken: Lidocaine 4% T opical Solution N/A N/A Pain Control: Callus, Slough N/A N/A Tissue Debrided: Skin/Epidermis N/A N/A Level: 1.3 N/A N/A Debridement A (sq cm): rea Curette N/A N/A Instrument: Minimum N/A N/A Bleeding: Pressure N/A N/A Hemostasis A chieved: 0 N/A N/A Procedural Pain: 0 N/A N/A Post Procedural Pain: Procedure was tolerated well N/A N/A Debridement Treatment Response: 0.5x0.7x0.3 N/A N/A Post Debridement Measurements L x W x D (cm) 0.082 N/A N/A Post Debridement Volume: (cm) Debridement N/A N/A Procedures Performed: Treatment Notes Electronic Signature(s) Signed: 06/03/2022 3:13:33 PM By: Fredirick Maudlin MD FACS Signed: 06/03/2022 5:20:00 PM By: Baruch Gouty RN, BSN Entered By: Fredirick Maudlin on 06/03/2022 15:13:33 -------------------------------------------------------------------------------- Multi-Disciplinary Care Plan Details Patient Name: Date of Service: [name], [name] 06/03/2022 2:00 PM Medical Record Number: [id number] Patient Account Number: [id number] Date of Birth/Sex: Treating RN: [birth year] (81 y.o. Male) Baruch Gouty Primary Care Noelene Gang: Maudry Mayhew Other Clinician: Referring Durwood Dittus: Treating Arah Aro/Extender: Horris Latino in Treatment: [place] reviewed with physician Active Inactive Nutrition Nursing Diagnoses: Impaired glucose control:  actual or potential Potential for alteratiion in Nutrition/Potential for imbalanced nutrition Goals: Patient/caregiver will maintain therapeutic glucose control Date Initiated: 04/20/2022 Target Resolution Date: 06/15/2022 Goal Status: Active Interventions: Assess HgA1c results as ordered upon admission and as needed Assess patient nutrition upon admission and as needed per policy Treatment Activities: Giving encouragement to exercise : 04/20/2022 Patient referred to Primary Care Physician for further nutritional evaluation : 04/20/2022 Notes: Wound/Skin Impairment Nursing Diagnoses: Impaired tissue integrity Knowledge deficit related to ulceration/compromised skin integrity Goals: Patient/caregiver will verbalize understanding of skin care regimen Date Initiated: 04/20/2022 Target Resolution Date: 06/15/2022 Goal Status: Active Ulcer/skin breakdown will have a volume reduction of 30% by week 4 Date Initiated: 04/20/2022 Date Inactivated: 05/20/2022 Target Resolution Date: 05/18/2022 Goal Status: Unmet Unmet Reason: difficulty offloading Interventions: Assess patient/caregiver ability to obtain necessary supplies Assess patient/caregiver ability to perform ulcer/skin care regimen upon admission and as needed Assess ulceration(s) every visit Provide education on ulcer and skin care Treatment Activities: Skin care regimen initiated : 04/20/2022 Topical wound management initiated : 04/20/2022 Notes: Electronic Signature(s) Signed: 06/03/2022 5:20:00 PM By: Baruch Gouty RN, BSN Entered By: Baruch Gouty on 06/03/2022 14:20:13 -------------------------------------------------------------------------------- Pain Assessment Details Patient Name: Date of Service: [name], Amay C. 06/03/2022 2:00 PM Medical Record Number: [id number] Patient Account Number: [id number] Date of Birth/Sex: Treating RN: 12-Sep-1941 (81 y.o. Male) Baruch Gouty Primary Care Lunna Vogelgesang: Maudry Mayhew Other  Clinician: Referring Vedha Tercero: Treating Zakir Henner/Extender: Horris Latino in Treatment: 6 Active Problems Location of Pain Severity and Description of Pain Patient Has Paino No Site Locations With Dressing Change: Yes Duration of the Pain. Constant / Intermittento Intermittent Rate the pain. Current Pain Level: 0 Worst Pain Level: 4 Least Pain Level: 0 Character of Pain Describe the Pain: Aching, Tender Pain Management and Medication Current Pain Management: Medication: Yes Is the Current Pain Management Adequate: Adequate How does your wound impact your activities of daily livingo Sleep: No Bathing: No Appetite: No Relationship With Others: No Bladder Continence: No Emotions: No Bowel Continence: No Work: No Toileting: No Drive: No Dressing: No Hobbies: No Engineer, maintenance) Signed: 06/03/2022 5:20:00 PM By: Baruch Gouty RN, BSN Entered By: Baruch Gouty on 06/03/2022 14:12:44 -------------------------------------------------------------------------------- Patient/Caregiver Education Details Patient Name: Date of Service: [name], Deray Loletha Grayer 8/10/2023andnbsp2:00 PM Medical Record Number: [id number] Patient Account Number: [id number] Date of Birth/Gender: Treating RN: 1941-04-30 (81 y.o.  Male) Baruch Gouty Primary Care Physician: Maudry Mayhew Other Clinician: Referring Physician: Treating Physician/Extender: Horris Latino in Treatment: 6 Education Assessment Education Provided To: Patient Education Topics Provided Offloading: Methods: Explain/Verbal Responses: Reinforcements needed, State content correctly Wound/Skin Impairment: Methods: Explain/Verbal Responses: Reinforcements needed, State content correctly Electronic Signature(s) Signed: 06/03/2022 5:20:00 PM By: Baruch Gouty RN, BSN Entered By: Baruch Gouty on 06/03/2022  14:20:36 -------------------------------------------------------------------------------- Wound Assessment Details Patient Name: Date of Service: [name], Marquice C. 06/03/2022 2:00 PM Medical Record Number: [id number] Patient Account Number: [id number] Date of Birth/Sex: Treating RN: Feb 06, 1941 (81 y.o. Male) Baruch Gouty Primary Care Harshan Kearley: Maudry Mayhew Other Clinician: Referring Wednesday Ericsson: Treating Alexandria Shiflett/Extender: Horris Latino in Treatment: 6 Wound Status Wound Number: 1 Primary Diabetic Wound/Ulcer of the Lower Extremity Etiology: Wound Location: Left Metatarsal head fifth Wound Open Wounding Event: Gradually Appeared Status: Date Acquired: 03/25/2022 Comorbid Cataracts, Angina, Coronary Artery Disease, Hypertension, Weeks Of Treatment: 6 History: Peripheral Arterial Disease, Type II Diabetes, Osteoarthritis, Clustered Wound: No Neuropathy Photos Wound Measurements Length: (cm) 0.5 Width: (cm) 0.7 Depth: (cm) 0.3 Area: (cm) 0.275 Volume: (cm) 0.082 % Reduction in Area: -287.3% % Reduction in Volume: -192.9% Epithelialization: None Tunneling: No Undermining: Yes Starting Position (o'clock): 3 Ending Position (o'clock): 7 Maximum Distance: (cm) 0.2 Wound Description Classification: Grade 2 Wound Margin: Well defined, not attached Exudate Amount: Medium Exudate Type: Serosanguineous Exudate Color: red, brown Foul Odor After Cleansing: No Slough/Fibrino No Wound Bed Granulation Amount: Large (67-100%) Exposed Structure Granulation Quality: Red, Pale Fascia Exposed: No Necrotic Amount: None Present (0%) Fat Layer (Subcutaneous Tissue) Exposed: Yes Tendon Exposed: No Muscle Exposed: No Joint Exposed: No Bone Exposed: No Electronic Signature(s) Signed: 06/03/2022 5:20:00 PM By: Baruch Gouty RN, BSN Entered By: Baruch Gouty on 06/03/2022  14:18:05 -------------------------------------------------------------------------------- Vitals Details Patient Name: Date of Service: [name], Talvin C. 06/03/2022 2:00 PM Medical Record Number: [id number] Patient Account Number: [id number] Date of Birth/Sex: Treating RN: Apr 02, 1941 (81 y.o. Male) Baruch Gouty Primary Care Danay Mckellar: Maudry Mayhew Other Clinician: Referring Reilley Valentine: Treating Sante Biedermann/Extender: Horris Latino in Treatment: 6 Vital Signs Time Taken: 14:10 Temperature (F): 98 Height (in): 69 Pulse (bpm): 70 Weight (lbs): 195 Respiratory Rate (breaths/min): 18 Body Mass Index (BMI): 28.8 Blood Pressure (mmHg): 176/76 Capillary Blood Glucose (mg/dl): 215 Reference Range: 80 - 120 mg / dl Notes glucose per pt report this am Electronic Signature(s) Signed: 06/03/2022 5:20:00 PM By: Baruch Gouty RN, BSN Entered By: Baruch Gouty on 06/03/2022 14:11:49

## 2022-06-03 NOTE — Progress Notes (Signed)
Steve [name], FLYNT ([id number]) Visit Report for 06/03/2022 Chief Complaint Document Details Patient Name: Date of Service: Steve [name] 06/03/2022 2:00 PM Medical Record Number: [id number] Patient Account Number: [id number] Date of Birth/Sex: Treating RN: June 14, 1941 (81 y.o. Male) Baruch Gouty Primary Care Provider: Maudry Mayhew Other Clinician: Referring Provider: Treating Provider/Extender: Horris Latino in Treatment: 6 Information Obtained from: Patient Chief Complaint Patients presents for treatment of an open diabetic ulcer Electronic Signature(s) Signed: 06/03/2022 3:13:41 PM By: Fredirick Maudlin MD FACS Entered By: Fredirick Maudlin on 06/03/2022 15:13:41 -------------------------------------------------------------------------------- Debridement Details Patient Name: Date of Service: Steve ND, [name] 06/03/2022 2:00 PM Medical Record Number: [id number] Patient Account Number: [id number] Date of Birth/Sex: Treating RN: 04/07/1941 (81 y.o. Male) Baruch Gouty Primary Care Provider: Maudry Mayhew Other Clinician: Referring Provider: Treating Provider/Extender: Horris Latino in Treatment: 6 Debridement Performed for Assessment: Wound #1 Left Metatarsal head fifth Performed By: Physician Fredirick Maudlin, MD Debridement Type: Debridement Severity of Tissue Pre Debridement: Fat layer exposed Level of Consciousness (Pre-procedure): Awake and Alert Pre-procedure Verification/Time Out Yes - 14:25 Taken: Start Time: 14:25 Pain Control: Lidocaine 4% T opical Solution T Area Debrided (L x W): otal 1 (cm) x 1.3 (cm) = 1.3 (cm) Tissue and other material debrided: Non-Viable, Callus, Slough, Skin: Epidermis, Slough Level: Skin/Epidermis Debridement Description: Selective/Open Wound Instrument: Curette Bleeding: Minimum Hemostasis Achieved: Pressure Procedural Pain: 0 Post Procedural Pain: 0 Response to Treatment:  Procedure was tolerated well Level of Consciousness (Post- Awake and Alert procedure): Post Debridement Measurements of Total Wound Length: (cm) 0.5 Width: (cm) 0.7 Depth: (cm) 0.3 Volume: (cm) 0.082 Character of Wound/Ulcer Post Debridement: Improved Severity of Tissue Post Debridement: Fat layer exposed Post Procedure Diagnosis Same as Pre-procedure Electronic Signature(s) Signed: 06/03/2022 4:47:38 PM By: Fredirick Maudlin MD FACS Signed: 06/03/2022 5:20:00 PM By: Baruch Gouty RN, BSN Entered By: Baruch Gouty on 06/03/2022 14:29:07 -------------------------------------------------------------------------------- HPI Details Patient Name: Date of Service: Steve ND, [name] 06/03/2022 2:00 PM Medical Record Number: [id number] Patient Account Number: [id number] Date of Birth/Sex: Treating RN: 1941-08-19 (81 y.o. Male) Baruch Gouty Primary Care Provider: Maudry Mayhew Other Clinician: Referring Provider: Treating Provider/Extender: Horris Latino in Treatment: 6 History of Present Illness HPI Description: ADMISSION 04/20/2022 This is an 81 year old man with a past medical history significant for poorly controlled type 2 diabetes mellitus, coronary artery disease, peripheral vascular disease, stage IV chronic kidney disease, and hypertension. He has an open ulcer on the plantar surface of his left fifth metatarsal head. It apparently just gradually appeared in the site where he had a callus. Last hemoglobin A1c that I was able to find in the electronic medical record was from 2020 and was 10.8. Formal vascular studies were done in March 2023 and the results are copied here: +-------+-----------+-----------+------------+------------+ ABI/TBIT oday's ABIT oday's TBIPrevious ABIPrevious TBI +-------+-----------+-----------+------------+------------+ Right [place] 0.30 [place] 0.46  +-------+-----------+-----------+------------+------------+ Left 0.57  0.20 0.66 0.23  +-------+-----------+-----------+------------+------------+ Summary: Right: Resting right ankle-brachial index indicates noncompressible right lower extremity arteries. The right toe-brachial index is abnormal. Left: Resting left ankle-brachial index indicates moderate left lower extremity arterial disease; however, indices may be overestimated due to the presence of calcified vessels. The toe-index is abnormal. The wound is fairly small, circular, and has a clean base. There is some periwound callus around the periphery. No significant undermining. Light layer of slough/biofilm on the surface. 04/28/2022: The wound measured a little bit larger today but this might be secondary to the debridement that I performed last  week. The callus around the wound is macerated and starting to fall off of its own accord. The base of the wound itself is fairly clean with just a small amount of slough. No erythema, induration, or purulent drainage. 05/06/2022: The wound is basically unchanged from last week. There is a little bit of undermining around the perimeter but the surface is clean with just a little bit of slough buildup. 05/20/2022: 2-week follow-up. There is callus accumulation around the wound orifice with some slough buildup at the wound bed. It does not appear that the home health nurses are putting the silver alginate into the wound in contact with the wound surface. No concern for infection. 06/03/2022: 2-week follow-up. There is a little bit of callus buildup on around the wound and some slough on the surface. His dressing is not staying in place very well as it slides when he goes to put on his slippers. We have been using silver alginate, but it does not appear that the home health nurses are getting it actually into the wound, simply laying it across the orifice. Electronic Signature(s) Signed: 06/03/2022 3:14:56 PM By: Fredirick Maudlin MD FACS Entered By: Fredirick Maudlin on  06/03/2022 15:14:55 -------------------------------------------------------------------------------- Physical Exam Details Patient Name: Date of Service: Steve ND, [name] 06/03/2022 2:00 PM Medical Record Number: [id number] Patient Account Number: [id number] Date of Birth/Sex: Treating RN: Jul 06, 1941 (81 y.o. Male) Baruch Gouty Primary Care Provider: Maudry Mayhew Other Clinician: Referring Provider: Treating Provider/Extender: Horris Latino in Treatment: 6 Constitutional Hypertensive, asymptomatic. . . . No acute distress.Marland Kitchen Respiratory Normal work of breathing on room air.. Notes 06/03/2022: There is a little bit of callus buildup on around the wound and some slough on the surface. No real change in dimensions. Electronic Signature(s) Signed: 06/03/2022 3:15:42 PM By: Fredirick Maudlin MD FACS Entered By: Fredirick Maudlin on 06/03/2022 15:15:42 -------------------------------------------------------------------------------- Physician Orders Details Patient Name: Date of Service: Steve ND, [name] 06/03/2022 2:00 PM Medical Record Number: [id number] Patient Account Number: [id number] Date of Birth/Sex: Treating RN: 16-Jan-1941 (81 y.o. Male) Baruch Gouty Primary Care Provider: Maudry Mayhew Other Clinician: Referring Provider: Treating Provider/Extender: Horris Latino in Treatment: 6 Verbal / Phone Orders: No Diagnosis Coding ICD-10 Coding Code Description [phone] Non-pressure chronic ulcer of other part of left foot with fat layer exposed E11.621 Type 2 diabetes mellitus with foot ulcer I73.9 Peripheral vascular disease, unspecified N18.4 Chronic kidney disease, stage 4 (severe) I25.10 Atherosclerotic heart disease of native coronary artery without angina pectoris I10 Essential (primary) hypertension Follow-up Appointments ppointment in 2 weeks. - Dr. Celine Ahr RM 1 with [facility] Return A Thursday 8/10 @ 2:00  pm Anesthetic (In clinic) Topical Lidocaine 4% applied to wound bed - prior to debridement Bathing/ Shower/ Hygiene May shower and wash wound with soap and water. - with dressing changes Home Health New wound care orders this week; continue Home Health for wound care. May utilize formulary equivalent dressing for wound treatment orders unless otherwise specified. - change to hydrofera blue Dressing changes to be completed by [place] on Monday / Wednesday / Friday except when patient has scheduled visit at [facility]. Other Home Health Orders/Instructions: - [facility] Wound Treatment Wound #1 - Metatarsal head fifth Wound Laterality: Left Prim Dressing: Hydrofera Blue Ready Foam, 2.5 x2.5 in 3 x Per Week/30 Days ary Discharge Instructions: Apply to wound bed, cut to fit inside wound marginse Secondary Dressing: Optifoam Non-Adhesive Dressing, 4x4 in 3 x Per Week/30 Days Discharge Instructions: Apply over  primary dressing cut to make foam donut Secondary Dressing: Woven Gauze Sponges 2x2 in 3 x Per Week/30 Days Discharge Instructions: Apply over primary dressing as directed. Secured With: 38M Medipore H Soft Cloth Surgical T ape, 4 x 10 (in/yd) 3 x Per Week/30 Days Discharge Instructions: Secure with tape as directed. Electronic Signature(s) Signed: 06/03/2022 4:47:38 PM By: Fredirick Maudlin MD FACS Entered By: Fredirick Maudlin on 06/03/2022 15:16:08 -------------------------------------------------------------------------------- Problem List Details Patient Name: Date of Service: Steve ND, Deval C. 06/03/2022 2:00 PM Medical Record Number: [id number] Patient Account Number: [id number] Date of Birth/Sex: Treating RN: 11-24-1940 (81 y.o. Male) Baruch Gouty Primary Care Provider: Maudry Mayhew Other Clinician: Referring Provider: Treating Provider/Extender: Horris Latino in Treatment: 6 Active Problems ICD-10 Encounter Code Description Active  Date MDM Diagnosis [phone] Non-pressure chronic ulcer of other part of left foot with fat layer exposed 04/20/2022 No Yes E11.621 Type 2 diabetes mellitus with foot ulcer 04/20/2022 No Yes I73.9 Peripheral vascular disease, unspecified 04/20/2022 No Yes N18.4 Chronic kidney disease, stage 4 (severe) 04/20/2022 No Yes I25.10 Atherosclerotic heart disease of native coronary artery without angina pectoris 04/20/2022 No Yes I10 Essential (primary) hypertension 04/20/2022 No Yes Inactive Problems Resolved Problems Electronic Signature(s) Signed: 06/03/2022 3:13:26 PM By: Fredirick Maudlin MD FACS Entered By: Fredirick Maudlin on 06/03/2022 15:13:26 -------------------------------------------------------------------------------- Progress Note Details Patient Name: Date of Service: Steve ND, [name] 06/03/2022 2:00 PM Medical Record Number: [id number] Patient Account Number: [id number] Date of Birth/Sex: Treating RN: July 25, 1941 (81 y.o. Male) Baruch Gouty Primary Care Provider: Maudry Mayhew Other Clinician: Referring Provider: Treating Provider/Extender: Horris Latino in Treatment: 6 Subjective Chief Complaint Information obtained from Patient Patients presents for treatment of an open diabetic ulcer History of Present Illness (HPI) ADMISSION 04/20/2022 This is an 81 year old man with a past medical history significant for poorly controlled type 2 diabetes mellitus, coronary artery disease, peripheral vascular disease, stage IV chronic kidney disease, and hypertension. He has an open ulcer on the plantar surface of his left fifth metatarsal head. It apparently just gradually appeared in the site where he had a callus. Last hemoglobin A1c that I was able to find in the electronic medical record was from 2020 and was 10.8. Formal vascular studies were done in March 2023 and the results are copied here: +-------+-----------+-----------+------------+------------+ ABI/TBIT  oday's ABIT oday's TBIPrevious ABIPrevious TBI +-------+-----------+-----------+------------+------------+ Right [place] 0.30 [place] 0.46  +-------+-----------+-----------+------------+------------+ Left 0.57 0.20 0.66 0.23  +-------+-----------+-----------+------------+------------+ Summary: Right: Resting right ankle-brachial index indicates noncompressible right lower extremity arteries. The right toe-brachial index is abnormal. Left: Resting left ankle-brachial index indicates moderate left lower extremity arterial disease; however, indices may be overestimated due to the presence of calcified vessels. The toe-index is abnormal. The wound is fairly small, circular, and has a clean base. There is some periwound callus around the periphery. No significant undermining. Light layer of slough/biofilm on the surface. 04/28/2022: The wound measured a little bit larger today but this might be secondary to the debridement that I performed last week. The callus around the wound is macerated and starting to fall off of its own accord. The base of the wound itself is fairly clean with just a small amount of slough. No erythema, induration, or purulent drainage. 05/06/2022: The wound is basically unchanged from last week. There is a little bit of undermining around the perimeter but the surface is clean with just a little bit of slough buildup. 05/20/2022: 2-week follow-up. There is callus accumulation around the wound orifice with some slough  buildup at the wound bed. It does not appear that the home health nurses are putting the silver alginate into the wound in contact with the wound surface. No concern for infection. 06/03/2022: 2-week follow-up. There is a little bit of callus buildup on around the wound and some slough on the surface. His dressing is not staying in place very well as it slides when he goes to put on his slippers. We have been using silver alginate, but it does not appear that  the home health nurses are getting it actually into the wound, simply laying it across the orifice. Patient History Information obtained from Patient, Chart. Family History Heart Disease - Father, Stroke - Mother, No family history of Cancer, Diabetes, Hereditary Spherocytosis, Hypertension, Kidney Disease, Lung Disease, Seizures, Thyroid Problems, Tuberculosis. Social History Former smoker - quit 60 yrs ago, Marital Status - Widowed, Alcohol Use - Rarely, Drug Use - No History, Caffeine Use - Rarely. Medical History Eyes Patient has history of Cataracts - right removed Denies history of Glaucoma, Optic Neuritis Cardiovascular Patient has history of Angina - afib, Coronary Artery Disease, Hypertension, Peripheral Arterial Disease Endocrine Patient has history of Type II Diabetes Denies history of Type I Diabetes Genitourinary Denies history of End Stage Renal Disease Integumentary (Skin) Denies history of History of Burn Musculoskeletal Patient has history of Osteoarthritis - hands Neurologic Patient has history of Neuropathy Oncologic Denies history of Received Chemotherapy, Received Radiation Psychiatric Denies history of Anorexia/bulimia, Confinement Anxiety Hospitalization/Surgery History - CABG 3 vessel. - cholecystectomy. Medical A Surgical History Notes nd Cardiovascular ischemic cardiomyopathy, hyperlipidemia Genitourinary CKD stage 4 Oncologic skin CA removed Objective Constitutional Hypertensive, asymptomatic. No acute distress.. Vitals Time Taken: 2:10 PM, Height: 69 in, Weight: 195 lbs, BMI: 28.8, Temperature: 98 F, Pulse: 70 bpm, Respiratory Rate: 18 breaths/min, Blood Pressure: 176/76 mmHg, Capillary Blood Glucose: 215 mg/dl. General Notes: glucose per pt report this am Respiratory Normal work of breathing on room air.. General Notes: 06/03/2022: There is a little bit of callus buildup on around the wound and some slough on the surface. No real change in  dimensions. Integumentary (Hair, Skin) Wound #1 status is Open. Original cause of wound was Gradually Appeared. The date acquired was: 03/25/2022. The wound has been in treatment 6 weeks. The wound is located on the Left Metatarsal head fifth. The wound measures 0.5cm length x 0.7cm width x 0.3cm depth; 0.275cm^2 area and 0.082cm^3 volume. There is Fat Layer (Subcutaneous Tissue) exposed. There is no tunneling noted, however, there is undermining starting at 3:00 and ending at 7:00 with a maximum distance of 0.2cm. There is a medium amount of serosanguineous drainage noted. The wound margin is well defined and not attached to the wound base. There is large (67-100%) red, pale granulation within the wound bed. There is no necrotic tissue within the wound bed. Assessment Active Problems ICD-10 Non-pressure chronic ulcer of other part of left foot with fat layer exposed Type 2 diabetes mellitus with foot ulcer Peripheral vascular disease, unspecified Chronic kidney disease, stage 4 (severe) Atherosclerotic heart disease of native coronary artery without angina pectoris Essential (primary) hypertension Procedures Wound #1 Pre-procedure diagnosis of Wound #1 is a Diabetic Wound/Ulcer of the Lower Extremity located on the Left Metatarsal head fifth .Severity of Tissue Pre Debridement is: Fat layer exposed. There was a Selective/Open Wound Skin/Epidermis Debridement with a total area of 1.3 sq cm performed by Fredirick Maudlin, MD. With the following instrument(s): Curette to remove Non-Viable tissue/material. Material removed includes Callus, Slough, and  Skin: Epidermis after achieving pain control using Lidocaine 4% Topical Solution. No specimens were taken. A time out was conducted at 14:25, prior to the start of the procedure. A Minimum amount of bleeding was controlled with Pressure. The procedure was tolerated well with a pain level of 0 throughout and a pain level of 0 following the procedure.  Post Debridement Measurements: 0.5cm length x 0.7cm width x 0.3cm depth; 0.082cm^3 volume. Character of Wound/Ulcer Post Debridement is improved. Severity of Tissue Post Debridement is: Fat layer exposed. Post procedure Diagnosis Wound #1: Same as Pre-Procedure Plan Follow-up Appointments: Return Appointment in 2 weeks. - Dr. Celine Ahr RM 1 with [facility] Thursday 8/10 @ 2:00 pm Anesthetic: (In clinic) Topical Lidocaine 4% applied to wound bed - prior to debridement Bathing/ Shower/ Hygiene: May shower and wash wound with soap and water. - with dressing changes Home Health: New wound care orders this week; continue Home Health for wound care. May utilize formulary equivalent dressing for wound treatment orders unless otherwise specified. - change to hydrofera blue Dressing changes to be completed by [place] on Monday / Wednesday / Friday except when patient has scheduled visit at [facility]. Other Home Health Orders/Instructions: - [facility] WOUND #1: - Metatarsal head fifth Wound Laterality: Left Prim Dressing: Hydrofera Blue Ready Foam, 2.5 x2.5 in 3 x Per Week/30 Days ary Discharge Instructions: Apply to wound bed, cut to fit inside wound marginse Secondary Dressing: Optifoam Non-Adhesive Dressing, 4x4 in 3 x Per Week/30 Days Discharge Instructions: Apply over primary dressing cut to make foam donut Secondary Dressing: Woven Gauze Sponges 2x2 in 3 x Per Week/30 Days Discharge Instructions: Apply over primary dressing as directed. Secured With: 13M Medipore H Soft Cloth Surgical T ape, 4 x 10 (in/yd) 3 x Per Week/30 Days Discharge Instructions: Secure with tape as directed. 06/03/2022:There is a little bit of callus buildup on around the wound and some slough on the surface. His dressing is not staying in place very well as it slides when he goes to put on his slippers. I used a curette to debride the periwound callus along with slough and a little bit of nonviable subcutaneous tissue.  We have been using silver alginate for 5 weeks without any significant improvement. I am going to change to [facility] to see if we have any forward progression. We will also secure the dressing better with Kerlix. Follow-up in 2 weeks. Electronic Signature(s) Signed: 06/03/2022 3:18:04 PM By: Fredirick Maudlin MD FACS Entered By: Fredirick Maudlin on 06/03/2022 15:18:03 -------------------------------------------------------------------------------- HxROS Details Patient Name: Date of Service: Steve ND, [name] 06/03/2022 2:00 PM Medical Record Number: [id number] Patient Account Number: [id number] Date of Birth/Sex: Treating RN: 09/03/1941 (81 y.o. Male) Baruch Gouty Primary Care Provider: Maudry Mayhew Other Clinician: Referring Provider: Treating Provider/Extender: Horris Latino in Treatment: 6 Information Obtained From Patient Chart Eyes Medical History: Positive for: Cataracts - right removed Negative for: Glaucoma; Optic Neuritis Cardiovascular Medical History: Positive for: Angina - afib; Coronary Artery Disease; Hypertension; Peripheral Arterial Disease Past Medical History Notes: ischemic cardiomyopathy, hyperlipidemia Endocrine Medical History: Positive for: Type II Diabetes Negative for: Type I Diabetes Time with diabetes: since 1999 Treated with: Insulin, Oral agents Blood sugar tested every day: Yes Tested : once Genitourinary Medical History: Negative for: End Stage Renal Disease Past Medical History Notes: CKD stage 4 Integumentary (Skin) Medical History: Negative for: History of Burn Musculoskeletal Medical History: Positive for: Osteoarthritis - hands Neurologic Medical History: Positive for: Neuropathy Oncologic Medical History:  Negative for: Received Chemotherapy; Received Radiation Past Medical History Notes: skin CA removed Psychiatric Medical History: Negative for: Anorexia/bulimia; Confinement Anxiety HBO  Extended History Items Eyes: Cataracts Immunizations Pneumococcal Vaccine: Received Pneumococcal Vaccination: Yes Received Pneumococcal Vaccination On or After 60th Birthday: Yes Implantable Devices No devices added Hospitalization / Surgery History Type of Hospitalization/Surgery CABG 3 vessel cholecystectomy Family and Social History Cancer: No; Diabetes: No; Heart Disease: Yes - Father; Hereditary Spherocytosis: No; Hypertension: No; Kidney Disease: No; Lung Disease: No; Seizures: No; Stroke: Yes - Mother; Thyroid Problems: No; Tuberculosis: No; Former smoker - quit 60 yrs ago; Marital Status - Widowed; Alcohol Use: Rarely; Drug Use: No History; Caffeine Use: Rarely; Financial Concerns: No; Food, Clothing or Shelter Needs: No; Support System Lacking: No; Transportation Concerns: No Electronic Signature(s) Signed: 06/03/2022 4:47:38 PM By: Fredirick Maudlin MD FACS Signed: 06/03/2022 5:20:00 PM By: Baruch Gouty RN, BSN Entered By: Fredirick Maudlin on 06/03/2022 15:15:02 -------------------------------------------------------------------------------- SuperBill Details Patient Name: Date of Service: Steve ND, [name] 06/03/2022 Medical Record Number: [id number] Patient Account Number: [id number] Date of Birth/Sex: Treating RN: Sep 03, 1941 (81 y.o. Male) Baruch Gouty Primary Care Provider: Maudry Mayhew Other Clinician: Referring Provider: Treating Provider/Extender: Horris Latino in Treatment: 6 Diagnosis Coding ICD-10 Codes Code Description [phone] Non-pressure chronic ulcer of other part of left foot with fat layer exposed E11.621 Type 2 diabetes mellitus with foot ulcer I73.9 Peripheral vascular disease, unspecified N18.4 Chronic kidney disease, stage 4 (severe) I25.10 Atherosclerotic heart disease of native coronary artery without angina pectoris I10 Essential (primary) hypertension Facility Procedures CPT4 Code: [id number] Description: [phone]  - DEBRIDE WOUND 1ST 20 SQ CM OR < ICD-10 Diagnosis Description L97.522 Non-pressure chronic ulcer of other part of left foot with fat layer exposed Modifier: Quantity: 1 Physician Procedures : CPT4 Code Description Modifier [id number] - WC PHYS LEVEL 3 - EST PT 25 ICD-10 Diagnosis Description L97.522 Non-pressure chronic ulcer of other part of left foot with fat layer exposed E11.621 Type 2 diabetes mellitus with foot ulcer I73.9  Peripheral vascular disease, unspecified N18.4 Chronic kidney disease, stage 4 (severe) Quantity: 1 : [phone] [id number] - WC PHYS DEBR WO ANESTH 20 SQ CM ICD-10 Diagnosis Description L97.522 Non-pressure chronic ulcer of other part of left foot with fat layer exposed Quantity: 1 Electronic Signature(s) Signed: 06/03/2022 3:18:28 PM By: Fredirick Maudlin MD FACS Entered By: Fredirick Maudlin on 06/03/2022 15:18:28

## 2022-06-17 ENCOUNTER — Other Ambulatory Visit (HOSPITAL_COMMUNITY): Payer: Self-pay | Admitting: General Surgery

## 2022-06-17 ENCOUNTER — Encounter (HOSPITAL_BASED_OUTPATIENT_CLINIC_OR_DEPARTMENT_OTHER): Payer: Medicare Other | Admitting: General Surgery

## 2022-06-17 ENCOUNTER — Other Ambulatory Visit (HOSPITAL_COMMUNITY): Payer: Self-pay | Admitting: *Deleted

## 2022-06-17 DIAGNOSIS — E11621 Type 2 diabetes mellitus with foot ulcer: Secondary | ICD-10-CM | POA: Diagnosis not present

## 2022-06-17 DIAGNOSIS — E08621 Diabetes mellitus due to underlying condition with foot ulcer: Secondary | ICD-10-CM

## 2022-06-17 NOTE — Progress Notes (Signed)
CHISTOPHER, MANGINO ([id number]) Visit Report for 06/17/2022 Chief Complaint Document Details Patient Name: Date of Service: BO NDWaddell, Iten 06/17/2022 2:00 PM Medical Record Number: [id number] Patient Account Number: [id number] Date of Birth/Sex: Treating RN: [birth year] (81 y.o. Ernestene Mention Primary Care Provider: Maudry Mayhew Other Clinician: Referring Provider: Treating Provider/Extender: Horris Latino in Treatment: 8 Information Obtained from: Patient Chief Complaint Patients presents for treatment of an open diabetic ulcer Electronic Signature(s) Signed: 06/17/2022 3:14:56 PM By: Fredirick Maudlin MD FACS Entered By: Fredirick Maudlin on 06/17/2022 15:14:56 -------------------------------------------------------------------------------- Debridement Details Patient Name: Date of Service: [name], Thurmond C. 06/17/2022 2:00 PM Medical Record Number: [id number] Patient Account Number: [id number] Date of Birth/Sex: Treating RN: November 12, 1940 (81 y.o. Collene Gobble Primary Care Provider: Maudry Mayhew Other Clinician: Referring Provider: Treating Provider/Extender: Horris Latino in Treatment: 8 Debridement Performed for Assessment: Wound #1 Left Metatarsal head fifth Performed By: Physician Fredirick Maudlin, MD Debridement Type: Debridement Severity of Tissue Pre Debridement: Fat layer exposed Level of Consciousness (Pre-procedure): Awake and Alert Pre-procedure Verification/Time Out Yes - 14:40 Taken: Start Time: 14:40 Pain Control: Lidocaine 4% T opical Solution T Area Debrided (L x W): otal 2 (cm) x 2 (cm) = 4 (cm) Tissue and other material debrided: Viable, Non-Viable, Slough, Subcutaneous, Tendon, Slough Level: Skin/Subcutaneous Tissue/Muscle Debridement Description: Excisional Instrument: Curette Specimen: Tissue Culture Number of Specimens T aken: 1 Bleeding: Minimum Hemostasis Achieved: Pressure Procedural Pain:  3 Post Procedural Pain: 1 Response to Treatment: Procedure was tolerated well Level of Consciousness (Post- Awake and Alert procedure): Post Debridement Measurements of Total Wound Length: (cm) 1.1 Width: (cm) 1.2 Depth: (cm) 0.4 Volume: (cm) 0.415 Character of Wound/Ulcer Post Debridement: Improved Severity of Tissue Post Debridement: Necrosis of muscle Post Procedure Diagnosis Same as Pre-procedure Electronic Signature(s) Signed: 06/17/2022 4:07:36 PM By: Fredirick Maudlin MD FACS Signed: 06/17/2022 5:51:42 PM By: Dellie Catholic RN Entered By: Dellie Catholic on 06/17/2022 14:51:46 -------------------------------------------------------------------------------- HPI Details Patient Name: Date of Service: [name], Brainard C. 06/17/2022 2:00 PM Medical Record Number: [id number] Patient Account Number: [id number] Date of Birth/Sex: Treating RN: 1941-09-20 (81 y.o. Ernestene Mention Primary Care Provider: Maudry Mayhew Other Clinician: Referring Provider: Treating Provider/Extender: Horris Latino in Treatment: 8 History of Present Illness HPI Description: ADMISSION 04/20/2022 This is an 81 year old man with a past medical history significant for poorly controlled type 2 diabetes mellitus, coronary artery disease, peripheral vascular disease, stage IV chronic kidney disease, and hypertension. He has an open ulcer on the plantar surface of his left fifth metatarsal head. It apparently just gradually appeared in the site where he had a callus. Last hemoglobin A1c that I was able to find in the electronic medical record was from 2020 and was 10.8. Formal vascular studies were done in March 2023 and the results are copied here: +-------+-----------+-----------+------------+------------+ ABI/TBIT oday's ABIT oday's TBIPrevious ABIPrevious TBI +-------+-----------+-----------+------------+------------+ Right [place] 0.30 [place] 0.46   +-------+-----------+-----------+------------+------------+ Left 0.57 0.20 0.66 0.23  +-------+-----------+-----------+------------+------------+ Summary: Right: Resting right ankle-brachial index indicates noncompressible right lower extremity arteries. The right toe-brachial index is abnormal. Left: Resting left ankle-brachial index indicates moderate left lower extremity arterial disease; however, indices may be overestimated due to the presence of calcified vessels. The toe-index is abnormal. The wound is fairly small, circular, and has a clean base. There is some periwound callus around the periphery. No significant undermining. Light layer of slough/biofilm on the surface. 04/28/2022: The wound measured a little bit larger today but this might be  secondary to the debridement that I performed last week. The callus around the wound is macerated and starting to fall off of its own accord. The base of the wound itself is fairly clean with just a small amount of slough. No erythema, induration, or purulent drainage. 05/06/2022: The wound is basically unchanged from last week. There is a little bit of undermining around the perimeter but the surface is clean with just a little bit of slough buildup. 05/20/2022: 2-week follow-up. There is callus accumulation around the wound orifice with some slough buildup at the wound bed. It does not appear that the home health nurses are putting the silver alginate into the wound in contact with the wound surface. No concern for infection. 06/03/2022: 2-week follow-up. There is a little bit of callus buildup on around the wound and some slough on the surface. His dressing is not staying in place very well as it slides when he goes to put on his slippers. We have been using silver alginate, but it does not appear that the home health nurses are getting it actually into the wound, simply laying it across the orifice. 06/17/2022: 2-week follow-up. The  patient reports that he has been having fevers and chills, although he is better today. The home health nurses also have told him that the wound is looking worse. On inspection, his foot is erythematous and the wound is deeper. T endon is now exposed. No purulent drainage, however. Electronic Signature(s) Signed: 06/17/2022 3:16:29 PM By: Fredirick Maudlin MD FACS Entered By: Fredirick Maudlin on 06/17/2022 15:16:29 -------------------------------------------------------------------------------- Physical Exam Details Patient Name: Date of Service: [name], Uriah C. 06/17/2022 2:00 PM Medical Record Number: [id number] Patient Account Number: [id number] Date of Birth/Sex: Treating RN: 1940-11-05 (81 y.o. Ernestene Mention Primary Care Provider: Maudry Mayhew Other Clinician: Referring Provider: Treating Provider/Extender: Horris Latino in Treatment: 8 Constitutional Hypertensive, asymptomatic. . . . No acute distress.Marland Kitchen Respiratory Normal work of breathing on room air.. Notes 06/17/2022: On inspection, his foot is erythematous and the wound is deeper. Tendon is now exposed. No purulent drainage, however. The wound appears to have gotten a bit macerated, as there is wet callus surrounding it. Electronic Signature(s) Signed: 06/17/2022 3:17:29 PM By: Fredirick Maudlin MD FACS Entered By: Fredirick Maudlin on 06/17/2022 15:17:29 -------------------------------------------------------------------------------- Physician Orders Details Patient Name: Date of Service: [name], Azeem C. 06/17/2022 2:00 PM Medical Record Number: [id number] Patient Account Number: [id number] Date of Birth/Sex: Treating RN: [birth year] (81 y.o. Collene Gobble Primary Care Provider: Maudry Mayhew Other Clinician: Referring Provider: Treating Provider/Extender: Horris Latino in Treatment: 8 Verbal / Phone Orders: No Diagnosis Coding Follow-up  Appointments ppointment in 1 week. - Dr. Celine Ahr RM 1 with [facility] Return A Thursday 8/31 @ 2:00 pm Anesthetic Wound #1 Left Metatarsal head fifth (In clinic) Topical Lidocaine 4% applied to wound bed - prior to debridement Bathing/ Shower/ Hygiene May shower and wash wound with soap and water. - with dressing changes Home Health New wound care orders this week; continue Home Health for wound care. May utilize formulary equivalent dressing for wound treatment orders unless otherwise specified. - change back to silver alginate, add gentamicin ointment to wound base Dressing changes to be completed by [place] on Monday / Wednesday / Friday except when patient has scheduled visit at [facility]. Other Home Health Orders/Instructions: - [facility] Wound Treatment Wound #1 - Metatarsal head fifth Wound Laterality: Left Topical: Gentamicin 3 x Per Week/30 Days Discharge Instructions: thin  layer to wound bed with dressing changes Prim Dressing: KerraCel Ag Gelling Fiber Dressing, 2x2 in (silver alginate) 3 x Per Week/30 Days ary Discharge Instructions: Apply silver alginate to wound bed as instructed Secondary Dressing: Optifoam Non-Adhesive Dressing, 4x4 in 3 x Per Week/30 Days Discharge Instructions: Apply over primary dressing cut to make foam donut Secondary Dressing: Woven Gauze Sponges 2x2 in 3 x Per Week/30 Days Discharge Instructions: Apply over primary dressing as directed. Secured With: 53M Medipore H Soft Cloth Surgical T ape, 4 x 10 (in/yd) 3 x Per Week/30 Days Discharge Instructions: Secure with tape as directed. 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 Fredirick Maudlin MD Patient Name: Provider: [birth year] [id number] Date of Birth: NPI#: Jerilynn Mages [id number] Sex: DEA #: [phone] Phone #: License #: [place] Patient Address: [address] [place], [place] [address] [address], [place] [address] [phone] Allergies Januvia; Statins-HMG-CoA Reductase Inhibitors Provider's Orders MRI, lower extremity with/without contrast left - diabetic foot ulcer left 5th metatarsal head, evaluate for osteomyelitis CPT 09735 ICD-10 E11.621 Hand Signature: Date(s): Electronic Signature(s) Signed: 06/17/2022 3:28:40 PM By: Fredirick Maudlin MD FACS Entered By: Fredirick Maudlin on 06/17/2022 15:28:39 -------------------------------------------------------------------------------- Problem List Details Patient Name: Date of Service: [name], Elyas C. 06/17/2022 2:00 PM Medical Record Number: [id number] Patient Account Number: [id number] Date of Birth/Sex: Treating RN: Apr 08, 1941 (81 y.o. 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 04/20/2022 No Yes without evidence of necrosis E11.621 Type 2 diabetes mellitus with foot ulcer 04/20/2022 No Yes I73.9 Peripheral vascular disease, unspecified 04/20/2022 No Yes N18.4 Chronic kidney disease, stage 4 (severe) 04/20/2022 No Yes I25.10 Atherosclerotic heart disease of native coronary artery without angina pectoris 04/20/2022 No Yes I10 Essential (primary)  hypertension 04/20/2022 No Yes Inactive Problems Resolved Problems Electronic Signature(s) Signed: 06/17/2022 3:19:01 PM By: Fredirick Maudlin MD FACS Previous Signature: 06/17/2022 3:14:40 PM Version By: Fredirick Maudlin MD FACS Entered By: Fredirick Maudlin on 06/17/2022 15:19:01 -------------------------------------------------------------------------------- Progress Note Details Patient Name: Date of Service: [name], Judd C. 06/17/2022 2:00 PM Medical Record Number: [id number] Patient Account Number: [id number] Date of Birth/Sex: Treating RN: 03/18/1941 (81 y.o. Ernestene Mention Primary Care Provider: Maudry Mayhew Other Clinician: Referring Provider: Treating Provider/Extender: Horris Latino in Treatment: 8 Subjective Chief Complaint Information obtained from Patient Patients presents for treatment of an open diabetic ulcer History of Present Illness (HPI) ADMISSION 04/20/2022 This is an 81 year old man with a past medical history significant for poorly controlled type 2 diabetes mellitus, coronary artery disease, peripheral vascular disease, stage IV chronic kidney disease, and hypertension. He has an open ulcer on the plantar surface of his left fifth metatarsal head. It apparently just gradually appeared in the site where he had a callus. Last hemoglobin A1c that I was able to find in the electronic medical record was from 2020 and was 10.8. Formal vascular studies were done  in March 2023 and the results are copied here: +-------+-----------+-----------+------------+------------+ ABI/TBIT oday's ABIT oday's TBIPrevious ABIPrevious TBI +-------+-----------+-----------+------------+------------+ Right [place] 0.30 [place] 0.46  +-------+-----------+-----------+------------+------------+ Left 0.57 0.20 0.66 0.23  +-------+-----------+-----------+------------+------------+ Summary: Right: Resting right ankle-brachial index indicates  noncompressible right lower extremity arteries. The right toe-brachial index is abnormal. Left: Resting left ankle-brachial index indicates moderate left lower extremity arterial disease; however, indices may be overestimated due to the presence of calcified vessels. The toe-index is abnormal. The wound is fairly small, circular, and has a clean base. There is some periwound callus around the periphery. No significant undermining. Light layer of slough/biofilm on the surface. 04/28/2022: The wound measured a little bit larger today but this might be secondary to the debridement that I performed last week. The callus around the wound is macerated and starting to fall off of its own accord. The base of the wound itself is fairly clean with just a small amount of slough. No erythema, induration, or purulent drainage. 05/06/2022: The wound is basically unchanged from last week. There is a little bit of undermining around the perimeter but the surface is clean with just a little bit of slough buildup. 05/20/2022: 2-week follow-up. There is callus accumulation around the wound orifice with some slough buildup at the wound bed. It does not appear that the home health nurses are putting the silver alginate into the wound in contact with the wound surface. No concern for infection. 06/03/2022: 2-week follow-up. There is a little bit of callus buildup on around the wound and some slough on the surface. His dressing is not staying in place very well as it slides when he goes to put on his slippers. We have been using silver alginate, but it does not appear that the home health nurses are getting it actually into the wound, simply laying it across the orifice. 06/17/2022: 2-week follow-up. The patient reports that he has been having fevers and chills, although he is better today. The home health nurses also have told him that the wound is looking worse. On inspection, his foot is erythematous and the wound is  deeper. T endon is now exposed. No purulent drainage, however. Patient History Information obtained from Patient, Chart. Family History Heart Disease - Father, Stroke - Mother, No family history of Cancer, Diabetes, Hereditary Spherocytosis, Hypertension, Kidney Disease, Lung Disease, Seizures, Thyroid Problems, Tuberculosis. Social History Former smoker - quit 60 yrs ago, Marital Status - Widowed, Alcohol Use - Rarely, Drug Use - No History, Caffeine Use - Rarely. Medical History Eyes Patient has history of Cataracts - right removed Denies history of Glaucoma, Optic Neuritis Cardiovascular Patient has history of Angina - afib, Coronary Artery Disease, Hypertension, Peripheral Arterial Disease Endocrine Patient has history of Type II Diabetes Denies history of Type I Diabetes Genitourinary Denies history of End Stage Renal Disease Integumentary (Skin) Denies history of History of Burn Musculoskeletal Patient has history of Osteoarthritis - hands Neurologic Patient has history of Neuropathy Oncologic Denies history of Received Chemotherapy, Received Radiation Psychiatric Denies history of Anorexia/bulimia, Confinement Anxiety Hospitalization/Surgery History - CABG 3 vessel. - cholecystectomy. Medical A Surgical History Notes nd Cardiovascular ischemic cardiomyopathy, hyperlipidemia Genitourinary CKD stage 4 Oncologic skin CA removed Objective Constitutional Hypertensive, asymptomatic. No acute distress.. Vitals Time Taken: 2:21 PM, Height: 69 in, Weight: 195 lbs, BMI: 28.8, Temperature: 98.1 F, Pulse: 76 bpm, Respiratory Rate: 16 breaths/min, Blood Pressure: 178/68 mmHg. Respiratory Normal work of breathing on room air.. General Notes: 06/17/2022: On inspection, his foot is erythematous and the wound  is deeper. Tendon is now exposed. No purulent drainage, however. The wound appears to have gotten a bit macerated, as there is wet callus surrounding it. Integumentary  (Hair, Skin) Wound #1 status is Open. Original cause of wound was Gradually Appeared. The date acquired was: 03/25/2022. The wound has been in treatment 8 weeks. The wound is located on the Left Metatarsal head fifth. The wound measures 0.7cm length x 0.8cm width x 0.4cm depth; 0.44cm^2 area and 0.176cm^3 volume. There is Fat Layer (Subcutaneous Tissue) exposed. There is no tunneling noted, however, there is undermining starting at 12:00 and ending at 12:00 with a maximum distance of 0.4cm. There is a medium amount of serosanguineous drainage noted. The wound margin is well defined and not attached to the wound base. There is medium (34-66%) red, pale granulation within the wound bed. There is a medium (34-66%) amount of necrotic tissue within the wound bed including Eschar and Adherent Slough. 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 There was a Excisional Skin/Subcutaneous Tissue/Muscle Debridement with a total area of 4 sq cm performed by Fredirick Maudlin, MD. With the following instrument(s): Curette to remove Viable and Non-Viable tissue/material. Material removed includes T endon, Subcutaneous Tissue, and Slough after achieving pain control using Lidocaine 4% T opical Solution. 1 specimen was taken by a Tissue Culture and sent to the lab per facility protocol. A time out was conducted at 14:40, prior to the start of the procedure. A Minimum amount of bleeding  was controlled with Pressure. The procedure was tolerated well with a pain level of 3 throughout and a pain level of 1 following the procedure. Post Debridement Measurements: 1.1cm length x 1.2cm width x 0.4cm depth; 0.415cm^3 volume. Character of Wound/Ulcer Post Debridement is improved. Severity of Tissue Post Debridement is: Necrosis of muscle. Post procedure Diagnosis Wound #1: Same as Pre-Procedure Plan Follow-up Appointments: Return Appointment in 1 week. - Dr. Celine Ahr RM 1 with [facility] Thursday 8/31 @ 2:00 pm Anesthetic: Wound #1 Left Metatarsal head fifth: (In clinic) Topical Lidocaine 4% applied to wound bed - prior to debridement Bathing/ Shower/ Hygiene: May shower and wash wound with soap and water. - with dressing changes Home Health: New wound care orders this week; continue Home Health for wound care. May utilize formulary equivalent dressing for wound treatment orders unless otherwise specified. - change back to silver alginate, add gentamicin ointment to wound base Dressing changes to be completed by [place] on Monday / Wednesday / Friday except when patient has scheduled visit at [facility]. Other Home Health Orders/Instructions: - [facility] The following medication(s) was prescribed: gentamicin topical 0.1 % ointment Apply to wound with each dressing change starting 06/17/2022 Augmentin oral 500 mg-125 mg tablet 1 tab p.o. twice daily x10 days starting 06/17/2022 WOUND #1: - Metatarsal head fifth Wound Laterality: Left Topical: Gentamicin 3 x Per Week/30 Days Discharge Instructions: thin layer to wound bed with dressing changes Prim Dressing: KerraCel Ag Gelling Fiber Dressing, 2x2 in (silver alginate) 3 x Per Week/30 Days ary Discharge Instructions: Apply silver alginate to wound bed  as instructed Secondary Dressing: Optifoam Non-Adhesive Dressing, 4x4 in 3 x Per Week/30 Days Discharge Instructions: Apply over primary dressing cut to make foam donut Secondary  Dressing: Woven Gauze Sponges 2x2 in 3 x Per Week/30 Days Discharge Instructions: Apply over primary dressing as directed. Secured With: 65M Medipore H Soft Cloth Surgical T ape, 4 x 10 (in/yd) 3 x Per Week/30 Days Discharge Instructions: Secure with tape as directed. 06/17/2022: On inspection, his foot is erythematous and the wound is deeper. Tendon is now exposed. No purulent drainage, however. The wound appears to have gotten a bit macerated, as there is wet callus surrounding it. I used a curette to debride the callus away from the wound perimeter. I also debrided slough, tendon, and nonviable subcutaneous tissue from the wound. I am concerned with his constellation of symptoms and the worsening of the wound, that he may have osteomyelitis. I took a culture today and we will follow-up those results. He had a plain x-ray in June that did not show osteo but clinically he has worsened since that time. We will order an MRI to evaluate. I have also prescribed Augmentin and topical gentamicin for his wounds. Follow-up in 1 week. Electronic Signature(s) Signed: 06/17/2022 3:27:29 PM By: Fredirick Maudlin MD FACS Previous Signature: 06/17/2022 3:26:58 PM Version By: Fredirick Maudlin MD FACS Previous Signature: 06/17/2022 3:23:26 PM Version By: Fredirick Maudlin MD FACS Entered By: Fredirick Maudlin on 06/17/2022 15:27:28 -------------------------------------------------------------------------------- HxROS Details Patient Name: Date of Service: [name], Rage C. 06/17/2022 2:00 PM Medical Record Number: [id number] Patient Account Number: [id number] Date of Birth/Sex: Treating RN: 1941-06-11 (81 y.o. Ernestene Mention Primary Care Provider: Maudry Mayhew Other Clinician: Referring Provider: Treating Provider/Extender: Horris Latino in Treatment: 8 Information Obtained From Patient Chart Eyes Medical History: Positive for: Cataracts - right removed Negative for: Glaucoma;  Optic Neuritis Cardiovascular Medical History: Positive for: Angina - afib; Coronary Artery Disease; Hypertension; Peripheral Arterial Disease Past Medical History Notes: ischemic cardiomyopathy, hyperlipidemia Endocrine Medical History: Positive for: Type II Diabetes Negative for: Type I Diabetes Time with diabetes: since 1999 Treated with: Insulin, Oral agents Blood sugar tested every day: Yes Tested : once Genitourinary Medical History: Negative for: End Stage Renal Disease Past Medical History Notes: CKD stage 4 Integumentary (Skin) Medical History: Negative for: History of Burn Musculoskeletal Medical History: Positive for: Osteoarthritis - hands Neurologic Medical History: Positive for: Neuropathy Oncologic Medical History: Negative for: Received Chemotherapy; Received Radiation Past Medical History Notes: skin CA removed Psychiatric Medical History: Negative for: Anorexia/bulimia; Confinement Anxiety HBO Extended History Items Eyes: Cataracts Immunizations Pneumococcal Vaccine: Received Pneumococcal Vaccination: Yes Received Pneumococcal Vaccination On or After 60th Birthday: Yes Implantable Devices No devices added Hospitalization / Surgery History Type of Hospitalization/Surgery CABG 3 vessel cholecystectomy Family and Social History Cancer: No; Diabetes: No; Heart Disease: Yes - Father; Hereditary Spherocytosis: No; Hypertension: No; Kidney Disease: No; Lung Disease: No; Seizures: No; Stroke: Yes - Mother; Thyroid Problems: No; Tuberculosis: No; Former smoker - quit 60 yrs ago; Marital Status - Widowed; Alcohol Use: Rarely; Drug Use: No History; Caffeine Use: Rarely; Financial Concerns: No; Food, Clothing or Shelter Needs: No; Support System Lacking: No; Transportation Concerns: No Engineer, maintenance) Signed: 06/17/2022 4:07:36 PM By: Fredirick Maudlin MD FACS Signed: 06/17/2022 5:23:24 PM By: Baruch Gouty RN, BSN Entered By: Fredirick Maudlin on  06/17/2022 15:16:43 -------------------------------------------------------------------------------- [place] Details Patient Name: Date of Service: [name], Latonya C. 06/17/2022 Medical Record Number: [id number] Patient Account Number: [id number] Date of Birth/Sex: Treating RN: 24-Aug-1941 (  81 y.o. 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 I73.9 Peripheral vascular disease, unspecified N18.4 Chronic kidney disease, stage 4 (severe) I25.10 Atherosclerotic heart disease of native coronary artery without angina pectoris I10 Essential (primary) hypertension Facility Procedures Physician Procedures : CPT4 Code Description Modifier [id number] - WC PHYS LEVEL 4 - EST PT 25 ICD-10 Diagnosis Description L97.525 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

## 2022-06-18 NOTE — Progress Notes (Signed)
Steve, [name] ([id number]) Visit Report for 06/17/2022 Arrival Information Details Patient Name: Date of Service: Steve NDKhalik, [name] 06/17/2022 2:00 PM Medical Record Number: [id number] Patient Account Number: [id number] Date of Birth/Sex: Treating RN: 1941-03-31 (81 y.o. Steve [name] Primary Care Amyriah Buras: Maudry Mayhew Other Clinician: Referring Marieanne Marxen: Treating Bryar Rennie/Extender: Horris Latino in Treatment: 8 Visit Information History Since Last Visit Added or deleted any medications: No Patient Arrived: Steve [name] Any new allergies or adverse reactions: No Arrival Time: 14:21 Had a fall or experienced change in No Accompanied By: self activities of daily living that may affect Transfer Assistance: None risk of falls: Patient Identification Verified: Yes Signs or symptoms of abuse/neglect since last visito No Patient Has Alerts: Yes Hospitalized since last visit: No Patient Alerts: Patient on Blood Thinner Implantable device outside of the clinic excluding No R ABI = N/C TBI = .30 cellular tissue based products placed in the center L ABI = .57 TBI = .20 since last visit: Has Dressing in Place as Prescribed: Yes Pain Present Now: No Electronic Signature(s) Signed: 06/17/2022 5:51:42 PM By: Dellie Catholic RN Entered By: Dellie Catholic on 06/17/2022 14:22:04 -------------------------------------------------------------------------------- Encounter Discharge Information Details Patient Name: Date of Service: Steve ND, [name] 06/17/2022 2:00 PM Medical Record Number: [id number] Patient Account Number: [id number] Date of Birth/Sex: Treating RN: 07/10/1941 (81 y.o. Steve [name] Primary Care Labron Bloodgood: Maudry Mayhew Other Clinician: Referring Arnella Pralle: Treating Carmelita Amparo/Extender: Horris Latino in Treatment: 8 Encounter Discharge Information Items Post Procedure Vitals Discharge Condition: Stable Temperature (F):  98.1 Ambulatory Status: Walker Pulse (bpm): 76 Discharge Destination: Home Respiratory Rate (breaths/min): 18 Transportation: Private Auto Blood Pressure (mmHg): 178/78 Accompanied By: self Schedule Follow-up Appointment: Yes Clinical Summary of Care: Patient Declined Electronic Signature(s) Signed: 06/17/2022 5:23:24 PM By: Baruch Gouty RN, BSN Entered By: Baruch Gouty on 06/17/2022 15:11:30 -------------------------------------------------------------------------------- Lower Extremity Assessment Details Patient Name: Date of Service: Steve ND, [name] 06/17/2022 2:00 PM Medical Record Number: [id number] Patient Account Number: [id number] Date of Birth/Sex: Treating RN: 1941-05-28 (81 y.o. Steve [name] Primary Care Tiffany Calmes: Maudry Mayhew Other Clinician: Referring Larissa Pegg: Treating Lakaya Tolen/Extender: Horris Latino in Treatment: 8 Edema Assessment Assessed: [Left: No] [Right: No] Edema: [Left: Ye] [Right: s] Calf Left: Right: Point of Measurement: From Medial Instep 35 cm Ankle Left: Right: Point of Measurement: From Medial Instep 24.5 cm Electronic Signature(s) Signed: 06/17/2022 5:51:42 PM By: Dellie Catholic RN Entered By: Dellie Catholic on 06/17/2022 14:27:12 -------------------------------------------------------------------------------- Multi Wound Chart Details Patient Name: Date of Service: Steve ND, [name] 06/17/2022 2:00 PM Medical Record Number: [id number] Patient Account Number: [id number] Date of Birth/Sex: Treating RN: 1941-06-09 (81 y.o. Steve [name] Primary Care Jessenya Berdan: Maudry Mayhew Other Clinician: Referring Shenise Wolgamott: Treating Tangala Wiegert/Extender: Horris Latino in Treatment: 8 Vital Signs Height(in): 30 Pulse(bpm): 53 Weight(lbs): 195 Blood Pressure(mmHg): 178/68 Body Mass Index(BMI): 28.8 Temperature(F): 98.1 Respiratory Rate(breaths/min): 16 Photos: [N/A:N/A] Left  Metatarsal head fifth N/A N/A Wound Location: Gradually Appeared N/A N/A Wounding Event: Diabetic Wound/Ulcer of the Lower N/A N/A Primary Etiology: Extremity Cataracts, Angina, Coronary Artery N/A N/A Comorbid History: Disease, Hypertension, Peripheral Arterial Disease, Type II Diabetes, Osteoarthritis, Neuropathy 03/25/2022 N/A N/A Date Acquired: 8 N/A N/A Weeks of Treatment: Open N/A N/A Wound Status: No N/A N/A Wound Recurrence: 0.7x0.8x0.4 N/A N/A Measurements L x W x D (cm) 0.44 N/A N/A A (cm) : rea 0.176 N/A N/A Volume (cm) : -519.70% N/A N/A % Reduction in A rea: -528.60% N/A N/A %  Reduction in Volume: 12 Starting Position 1 (o'clock): 12 Ending Position 1 (o'clock): 0.4 Maximum Distance 1 (cm): Yes N/A N/A Undermining: Grade 2 N/A N/A Classification: Medium N/A N/A Exudate A mount: Serosanguineous N/A N/A Exudate Type: red, brown N/A N/A Exudate Color: Well defined, not attached N/A N/A Wound Margin: Medium (34-66%) N/A N/A Granulation A mount: Red, Pale N/A N/A Granulation Quality: Medium (34-66%) N/A N/A Necrotic A mount: Eschar, Adherent Slough N/A N/A Necrotic Tissue: Fat Layer (Subcutaneous Tissue): Yes N/A N/A Exposed Structures: Fascia: No Tendon: No Muscle: No Joint: No Bone: No None N/A N/A Epithelialization: Debridement - Excisional N/A N/A Debridement: Pre-procedure Verification/Time Out 14:40 N/A N/A Taken: Lidocaine 4% Topical Solution N/A N/A Pain Control: Tendon, Subcutaneous, Slough N/A N/A Tissue Debrided: Skin/Subcutaneous Tissue/Muscle N/A N/A Level: 4 N/A N/A Debridement A (sq cm): rea Curette N/A N/A Instrument: Minimum N/A N/A Bleeding: Pressure N/A N/A Hemostasis A chieved: 3 N/A N/A Procedural Pain: 1 N/A N/A Post Procedural Pain: Procedure was tolerated well N/A N/A Debridement Treatment Response: 1.1x1.2x0.4 N/A N/A Post Debridement Measurements L x W x D (cm) 0.415 N/A N/A Post  Debridement Volume: (cm) Debridement N/A N/A Procedures Performed: Treatment Notes Wound #1 (Metatarsal head fifth) Wound Laterality: Left Cleanser Peri-Wound Care Topical Gentamicin Discharge Instruction: thin layer to wound bed with dressing changes Primary Dressing KerraCel Ag Gelling Fiber Dressing, 2x2 in (silver alginate) Discharge Instruction: Apply silver alginate to wound bed as instructed Secondary Dressing Optifoam Non-Adhesive Dressing, 4x4 in Discharge Instruction: Apply over primary dressing cut to make foam donut Woven Gauze Sponges 2x2 in Discharge Instruction: Apply over primary dressing as directed. Secured With 37M Medipore H Soft Cloth Surgical T ape, 4 x 10 (in/yd) Discharge Instruction: Secure with tape as directed. Compression Wrap Compression Stockings Add-Ons Electronic Signature(s) Signed: 06/17/2022 3:14:49 PM By: Fredirick Maudlin MD FACS Signed: 06/17/2022 5:23:24 PM By: Baruch Gouty RN, BSN Entered By: Fredirick Maudlin on 06/17/2022 15:14:49 -------------------------------------------------------------------------------- Multi-Disciplinary Care Plan Details Patient Name: Date of Service: Steve ND, Kelvon C. 06/17/2022 2:00 PM Medical Record Number: [id number] Patient Account Number: [id number] Date of Birth/Sex: Treating RN: 26-May-1941 (81 y.o. Steve [name] Primary Care Azhar Knope: Maudry Mayhew Other Clinician: Referring Mulan Adan: Treating Arkin Imran/Extender: Horris Latino in Treatment: [place] reviewed with physician Active Inactive Nutrition Nursing Diagnoses: Impaired glucose control: actual or potential Potential for alteratiion in Nutrition/Potential for imbalanced nutrition Goals: Patient/caregiver will maintain therapeutic glucose control Date Initiated: 04/20/2022 Target Resolution Date: 07/15/2022 Goal Status: Active Interventions: Assess HgA1c results as ordered upon admission  and as needed Assess patient nutrition upon admission and as needed per policy Treatment Activities: Giving encouragement to exercise : 04/20/2022 Patient referred to Primary Care Physician for further nutritional evaluation : 04/20/2022 Notes: Wound/Skin Impairment Nursing Diagnoses: Impaired tissue integrity Knowledge deficit related to ulceration/compromised skin integrity Goals: Patient/caregiver will verbalize understanding of skin care regimen Date Initiated: 04/20/2022 Target Resolution Date: 07/15/2022 Goal Status: Active Ulcer/skin breakdown will have a volume reduction of 30% by week 4 Date Initiated: 04/20/2022 Date Inactivated: 05/20/2022 Target Resolution Date: 05/18/2022 Goal Status: Unmet Unmet Reason: difficulty offloading Interventions: Assess patient/caregiver ability to obtain necessary supplies Assess patient/caregiver ability to perform ulcer/skin care regimen upon admission and as needed Assess ulceration(s) every visit Provide education on ulcer and skin care Treatment Activities: Skin care regimen initiated : 04/20/2022 Topical wound management initiated : 04/20/2022 Notes: Electronic Signature(s) Signed: 06/17/2022 5:51:42 PM By: Dellie Catholic RN Entered By: Dellie Catholic on 06/17/2022 15:04:06 -------------------------------------------------------------------------------- Pain  Assessment Details Patient Name: Date of Service: Steve ND, JOENATHAN SAKUMA 06/17/2022 2:00 PM Medical Record Number: [id number] Patient Account Number: [id number] Date of Birth/Sex: Treating RN: [birth year] (81 y.o. Steve [name] Primary Care Macaila Tahir: Maudry Mayhew Other Clinician: Referring Uliana Brinker: Treating Dajahnae Vondra/Extender: Horris Latino in Treatment: 8 Active Problems Location of Pain Severity and Description of Pain Patient Has Paino Yes Site Locations Pain Location: Generalized Pain With Dressing Change: Yes Duration of the Pain. Constant  / Intermittento Constant Rate the pain. Current Pain Level: 5 Worst Pain Level: 10 Least Pain Level: 2 Tolerable Pain Level: 5 Character of Pain Describe the Pain: Difficult to Pinpoint Pain Management and Medication Current Pain Management: Medication: Yes Cold Application: No Rest: Yes Massage: No Activity: No T.E.N.S.: No Heat Application: No Leg drop or elevation: No Is the Current Pain Management Adequate: Adequate How does your wound impact your activities of daily livingo Sleep: No Bathing: No Appetite: No Relationship With Others: No Bladder Continence: No Emotions: No Bowel Continence: No Work: No Toileting: No Drive: No Dressing: No Hobbies: No Electronic Signature(s) Signed: 06/17/2022 5:51:42 PM By: Dellie Catholic RN Entered By: Dellie Catholic on 06/17/2022 14:27:05 -------------------------------------------------------------------------------- Patient/Caregiver Education Details Patient Name: Date of Service: Steve ND, Julio Loletha Grayer 8/24/2023andnbsp2:00 PM Medical Record Number: [id number] Patient Account Number: [id number] Date of Birth/Gender: Treating RN: [birth year] (81 y.o. Steve [name] Primary Care Physician: Maudry Mayhew Other Clinician: Referring Physician: Treating Physician/Extender: Horris Latino in Treatment: 8 Education Assessment Education Provided To: Patient Education Topics Provided Elevated Blood Sugar/ Impact on Healing: Methods: Explain/Verbal Responses: Reinforcements needed, State content correctly Infection: Methods: Explain/Verbal Responses: Reinforcements needed Wound/Skin Impairment: Methods: Explain/Verbal Responses: Reinforcements needed, State content correctly Electronic Signature(s) Signed: 06/17/2022 5:51:42 PM By: Dellie Catholic RN Entered By: Dellie Catholic on 06/17/2022 15:04:50 -------------------------------------------------------------------------------- Wound Assessment  Details Patient Name: Date of Service: Steve ND, Harshil C. 06/17/2022 2:00 PM Medical Record Number: [id number] Patient Account Number: [id number] Date of Birth/Sex: Treating RN: August 09, 1941 (81 y.o. Steve [name] Primary Care Areatha Kalata: Maudry Mayhew Other Clinician: Referring Orazio Weller: Treating Akin Yi/Extender: Horris Latino in Treatment: 8 Wound Status Wound Number: 1 Primary Diabetic Wound/Ulcer of the Lower Extremity Etiology: Wound Location: Left Metatarsal head fifth Wound Open Wounding Event: Gradually Appeared Status: Date Acquired: 03/25/2022 Comorbid Cataracts, Angina, Coronary Artery Disease, Hypertension, Weeks Of Treatment: 8 History: Peripheral Arterial Disease, Type II Diabetes, Osteoarthritis, Clustered Wound: No Neuropathy Photos Wound Measurements Length: (cm) 0.7 Width: (cm) 0.8 Depth: (cm) 0.4 Area: (cm) 0.44 Volume: (cm) 0.176 % Reduction in Area: -519.7% % Reduction in Volume: -528.6% Epithelialization: None Tunneling: No Undermining: Yes Starting Position (o'clock): 12 Ending Position (o'clock): 12 Maximum Distance: (cm) 0.4 Wound Description Classification: Grade 2 Wound Margin: Well defined, not attached Exudate Amount: Medium Exudate Type: Serosanguineous Exudate Color: red, brown Foul Odor After Cleansing: No Slough/Fibrino No Wound Bed Granulation Amount: Medium (34-66%) Exposed Structure Granulation Quality: Red, Pale Fascia Exposed: No Necrotic Amount: Medium (34-66%) Fat Layer (Subcutaneous Tissue) Exposed: Yes Necrotic Quality: Eschar, Adherent Slough Tendon Exposed: No Muscle Exposed: No Joint Exposed: No Bone Exposed: No Treatment Notes Wound #1 (Metatarsal head fifth) Wound Laterality: Left Cleanser Peri-Wound Care Topical Gentamicin Discharge Instruction: thin layer to wound bed with dressing changes Primary Dressing KerraCel Ag Gelling Fiber Dressing, 2x2 in (silver alginate) Discharge  Instruction: Apply silver alginate to wound bed as instructed Secondary Dressing Optifoam Non-Adhesive Dressing, 4x4 in Discharge Instruction: Apply over primary dressing cut to make foam donut  Woven Gauze Sponges 2x2 in Discharge Instruction: Apply over primary dressing as directed. Secured With 8M Medipore H Soft Cloth Surgical T ape, 4 x 10 (in/yd) Discharge Instruction: Secure with tape as directed. Compression Wrap Compression Stockings Add-Ons Electronic Signature(s) Signed: 06/17/2022 5:51:42 PM By: Dellie Catholic RN Signed: 06/18/2022 10:40:34 AM By: Sandre Kitty Entered By: Sandre Kitty on 06/17/2022 14:26:22 -------------------------------------------------------------------------------- Vitals Details Patient Name: Date of Service: Steve ND, Erron C. 06/17/2022 2:00 PM Medical Record Number: [id number] Patient Account Number: [id number] Date of Birth/Sex: Treating RN: [birth year] (81 y.o. Steve [name] Primary Care Aaryn Sermon: Other Clinician: Maudry Mayhew Referring Guerino Caporale: Treating Rakeb Kibble/Extender: Horris Latino in Treatment: 8 Vital Signs Time Taken: 14:21 Temperature (F): 98.1 Height (in): 69 Pulse (bpm): 76 Weight (lbs): 195 Respiratory Rate (breaths/min): 16 Body Mass Index (BMI): 28.8 Blood Pressure (mmHg): 178/68 Reference Range: 80 - 120 mg / dl Electronic Signature(s) Signed: 06/17/2022 5:51:42 PM By: Dellie Catholic RN Entered By: Dellie Catholic on 06/17/2022 14:22:53

## 2022-06-24 ENCOUNTER — Encounter (HOSPITAL_BASED_OUTPATIENT_CLINIC_OR_DEPARTMENT_OTHER): Payer: Medicare Other | Admitting: General Surgery

## 2022-06-24 DIAGNOSIS — E871 Hypo-osmolality and hyponatremia: Secondary | ICD-10-CM | POA: Diagnosis not present

## 2022-06-24 DIAGNOSIS — A419 Sepsis, unspecified organism: Secondary | ICD-10-CM | POA: Diagnosis not present

## 2022-06-24 NOTE — Progress Notes (Addendum)
TYWAUN, Steve [name] ([id number]) Visit Report for 06/24/2022 Chief Complaint Document Details Patient Name: Date of Service: Steve NDIrving, [place] 06/24/2022 2:00 PM Medical Record Number: [id number] Patient Account Number: [id number] Date of Birth/Sex: Treating RN: 24-Apr-1941 (81 y.o. Ernestene Mention Primary Care Provider: Maudry Mayhew Other Clinician: Referring Provider: Treating Provider/Extender: Horris Latino in Treatment: 9 Information Obtained from: Patient Chief Complaint Patients presents for treatment of an open diabetic ulcer Electronic Signature(s) Signed: 06/24/2022 2:18:42 PM By: Fredirick Maudlin MD FACS Entered By: Fredirick Maudlin on 06/24/2022 14:18:42 -------------------------------------------------------------------------------- Debridement Details Patient Name: Date of Service: Steve [name], [name] 06/24/2022 2:00 PM Medical Record Number: [id number] Patient Account Number: [id number] Date of Birth/Sex: Treating RN: 1940/11/09 (81 y.o. Collene Gobble Primary Care Provider: Maudry Mayhew Other Clinician: Referring Provider: Treating Provider/Extender: Horris Latino in Treatment: 9 Debridement Performed for Assessment: Wound #1 Left Metatarsal head fifth Performed By: Physician Fredirick Maudlin, MD Debridement Type: Debridement Severity of Tissue Pre Debridement: Fat layer exposed Level of Consciousness (Pre-procedure): Awake and Alert Pre-procedure Verification/Time Out Yes - 13:52 Taken: Start Time: 13:52 Pain Control: Lidocaine 4% T opical Solution T Area Debrided (L x W): otal 0.7 (cm) x 0.8 (cm) = 0.56 (cm) Tissue and other material debrided: Non-Viable, Slough, Subcutaneous, Slough Level: Skin/Subcutaneous Tissue Debridement Description: Excisional Instrument: Curette Bleeding: Minimum Hemostasis Achieved: Pressure End Time: 13:53 Procedural Pain: 0 Post Procedural Pain: 0 Response to Treatment:  Procedure was tolerated well Level of Consciousness (Post- Awake and Alert procedure): Post Debridement Measurements of Total Wound Length: (cm) 0.7 Width: (cm) 0.8 Depth: (cm) 0.3 Volume: (cm) 0.132 Character of Wound/Ulcer Post Debridement: Improved Severity of Tissue Post Debridement: Fat layer exposed Post Procedure Diagnosis Same as Pre-procedure Electronic Signature(s) Signed: 06/24/2022 2:27:27 PM By: Fredirick Maudlin MD FACS Signed: 06/24/2022 5:12:10 PM By: Dellie Catholic RN Entered By: Dellie Catholic on 06/24/2022 14:19:56 -------------------------------------------------------------------------------- HPI Details Patient Name: Date of Service: Steve [name], [name] 06/24/2022 2:00 PM Medical Record Number: [id number] Patient Account Number: [id number] Date of Birth/Sex: Treating RN: 04-14-1941 (81 y.o. Ernestene Mention Primary Care Provider: Maudry Mayhew Other Clinician: Referring Provider: Treating Provider/Extender: Horris Latino in Treatment: 9 History of Present Illness HPI Description: ADMISSION 04/20/2022 This is an 81 year old man with a past medical history significant for poorly controlled type 2 diabetes mellitus, coronary artery disease, peripheral vascular disease, stage IV chronic kidney disease, and hypertension. He has an open ulcer on the plantar surface of his left fifth metatarsal head. It apparently just gradually appeared in the site where he had a callus. Last hemoglobin A1c that I was able to find in the electronic medical record was from 2020 and was 10.8. Formal vascular studies were done in March 2023 and the results are copied here: +-------+-----------+-----------+------------+------------+ ABI/TBIT oday's ABIT oday's TBIPrevious ABIPrevious TBI +-------+-----------+-----------+------------+------------+ Right [place] 0.30 [place] 0.46  +-------+-----------+-----------+------------+------------+ Left 0.57 0.20  0.66 0.23  +-------+-----------+-----------+------------+------------+ Summary: Right: Resting right ankle-brachial index indicates noncompressible right lower extremity arteries. The right toe-brachial index is abnormal. Left: Resting left ankle-brachial index indicates moderate left lower extremity arterial disease; however, indices may be overestimated due to the presence of calcified vessels. The toe-index is abnormal. The wound is fairly small, circular, and has a clean base. There is some periwound callus around the periphery. No significant undermining. Light layer of slough/biofilm on the surface. 04/28/2022: The wound measured a little bit larger today but this might be secondary to the debridement that I performed last  week. The callus around the wound is macerated and starting to fall off of its own accord. The base of the wound itself is fairly clean with just a small amount of slough. No erythema, induration, or purulent drainage. 05/06/2022: The wound is basically unchanged from last week. There is a little bit of undermining around the perimeter but the surface is clean with just a little bit of slough buildup. 05/20/2022: 2-week follow-up. There is callus accumulation around the wound orifice with some slough buildup at the wound bed. It does not appear that the home health nurses are putting the silver alginate into the wound in contact with the wound surface. No concern for infection. 06/03/2022: 2-week follow-up. There is a little bit of callus buildup on around the wound and some slough on the surface. His dressing is not staying in place very well as it slides when he goes to put on his slippers. We have been using silver alginate, but it does not appear that the home health nurses are getting it actually into the wound, simply laying it across the orifice. 06/17/2022: 2-week follow-up. The patient reports that he has been having fevers and chills, although he is better today.  The home health nurses also have told him that the wound is looking worse. On inspection, his foot is erythematous and the wound is deeper. T endon is now exposed. No purulent drainage, however. 06/24/2022: The culture that I took last week grew out multiple species. The recommended antibiotics were Bactrim and Augmentin. These were prescribed but the patient has not yet began to take them although he does have them in his possession. He has continued to have some chills at night. The wound is about the same size. His foot is less erythematous. The tendon remains exposed at the base. There is slough accumulation. We have been using a topical gentamicin with silver alginate for his dressing change. Electronic Signature(s) Signed: 06/24/2022 2:20:00 PM By: Fredirick Maudlin MD FACS Entered By: Fredirick Maudlin on 06/24/2022 14:20:00 -------------------------------------------------------------------------------- Physical Exam Details Patient Name: Date of Service: Steve [name], [name] 06/24/2022 2:00 PM Medical Record Number: [id number] Patient Account Number: [id number] Date of Birth/Sex: Treating RN: Apr 30, 1941 (81 y.o. Ernestene Mention Primary Care Provider: Maudry Mayhew Other Clinician: Referring Provider: Treating Provider/Extender: Horris Latino in Treatment: 9 Constitutional Hypertensive, asymptomatic. Bradycardic, asymptomatic.. . . No acute distress.Marland Kitchen Respiratory Normal work of breathing on room air.. Notes 06/24/2022: The wound is about the same size. His foot is less erythematous. The tendon remains exposed at the base. Electronic Signature(s) Signed: 06/24/2022 2:22:09 PM By: Fredirick Maudlin MD FACS Entered By: Fredirick Maudlin on 06/24/2022 14:22:09 -------------------------------------------------------------------------------- Physician Orders Details Patient Name: Date of Service: Steve [name], [name] 06/24/2022 2:00 PM Medical Record Number:  [id number] Patient Account Number: [id number] Date of Birth/Sex: Treating RN: 26-Sep-1941 (81 y.o. 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 - Dr. Celine Ahr RM 1 with [facility] Return A Monday September 11th at 2:45pm Anesthetic Wound #1 Left Metatarsal head  fifth (In clinic) Topical Lidocaine 4% applied to wound bed - prior to debridement Bathing/ Shower/ Hygiene May shower and wash wound with soap and water. - with dressing changes Home Health New wound care orders this week; continue Home Health for wound care. May utilize formulary equivalent dressing for wound treatment orders unless otherwise specified. - change back to silver alginate, add gentamicin ointment to wound base Dressing changes to be completed by [facility] on Monday / Wednesday / Friday except when patient has scheduled visit at [facility]. Other Home Health Orders/Instructions: - [facility] Wound Treatment Wound #1 - Metatarsal head fifth Wound Laterality: Left Topical: Gentamicin 3 x Per Week/30 Days Discharge Instructions: thin layer to wound bed with dressing changes Prim Dressing: KerraCel Ag Gelling Fiber Dressing, 2x2 in (silver alginate) 3 x Per Week/30 Days ary Discharge Instructions: Apply silver alginate to wound bed as instructed Secondary Dressing: Optifoam Non-Adhesive  Dressing, 4x4 in 3 x Per Week/30 Days Discharge Instructions: Apply over primary dressing cut to make foam donut Secondary Dressing: Woven Gauze Sponges 2x2 in 3 x Per Week/30 Days Discharge Instructions: Apply over primary dressing as directed. Secured With: 52M Medipore H Soft Cloth Surgical T ape, 4 x 10 (in/yd) 3 x Per Week/30 Days Discharge Instructions: Secure with tape as directed. Electronic Signature(s) Signed: 06/24/2022 5:12:10 PM By: Dellie Catholic RN Signed: 06/25/2022 7:32:40 AM By: Fredirick Maudlin MD FACS Entered By: Dellie Catholic on 06/24/2022 16:54:17 -------------------------------------------------------------------------------- Problem List Details Patient Name: Date of Service: Steve [name], [name] 06/24/2022 2:00 PM Medical Record Number: [id number] Patient Account Number: [id number] Date of Birth/Sex: Treating RN: 05-04-1941 (81 y.o. 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 04/20/2022 No Yes without evidence of necrosis E11.621 Type 2 diabetes mellitus with foot ulcer 04/20/2022 No Yes I73.9 Peripheral vascular disease, unspecified 04/20/2022 No Yes N18.4 Chronic kidney disease, stage 4 (severe) 04/20/2022 No Yes I25.10 Atherosclerotic heart disease of native coronary artery without angina pectoris 04/20/2022 No Yes I10 Essential (primary) hypertension 04/20/2022 No Yes Inactive Problems Resolved Problems Electronic Signature(s) Signed: 06/24/2022 2:17:54 PM By: Fredirick Maudlin MD FACS Entered By: Fredirick Maudlin on 06/24/2022 14:17:54 -------------------------------------------------------------------------------- Progress Note Details Patient Name: Date of  Service: Steve [name], [name] 06/24/2022 2:00 PM Medical Record Number: [id number] Patient Account Number: [id number] Date of Birth/Sex: Treating RN: [birth year] (81 y.o. Ernestene Mention Primary Care Provider: Maudry Mayhew Other Clinician: Referring Provider: Treating Provider/Extender: Horris Latino in Treatment: 9 Subjective Chief Complaint Information obtained from Patient Patients presents for treatment of an open diabetic ulcer History of Present Illness (HPI) ADMISSION 04/20/2022 This is an 82 year old man with a past medical history significant for poorly controlled type 2 diabetes mellitus, coronary artery disease, peripheral vascular disease, stage IV chronic kidney disease, and hypertension. He has an open ulcer on the plantar surface of his left fifth metatarsal head. It apparently just gradually appeared in the site where he had a callus. Last hemoglobin A1c that I was able to find in the electronic medical record was from 2020 and was 10.8. Formal vascular studies were done in March 2023 and the results are copied here: +-------+-----------+-----------+------------+------------+ ABI/TBIT oday's ABIT oday's TBIPrevious ABIPrevious TBI +-------+-----------+-----------+------------+------------+ Right [place] 0.30 [place] 0.46  +-------+-----------+-----------+------------+------------+ Left 0.57 0.20 0.66 0.23  +-------+-----------+-----------+------------+------------+ Summary:  Right: Resting right ankle-brachial index indicates noncompressible right lower extremity arteries. The right toe-brachial index is abnormal. Left: Resting left ankle-brachial index indicates moderate left lower extremity arterial disease; however, indices may be overestimated due to the presence of calcified vessels. The toe-index is abnormal. The wound is fairly small, circular, and has a clean base. There is some periwound callus around the periphery. No  significant undermining. Light layer of slough/biofilm on the surface. 04/28/2022: The wound measured a little bit larger today but this might be secondary to the debridement that I performed last week. The callus around the wound is macerated and starting to fall off of its own accord. The base of the wound itself is fairly clean with just a small amount of slough. No erythema, induration, or purulent drainage. 05/06/2022: The wound is basically unchanged from last week. There is a little bit of undermining around the perimeter but the surface is clean with just a little bit of slough buildup. 05/20/2022: 2-week follow-up. There is callus accumulation around the wound orifice with some slough buildup at the wound bed. It does not appear that the home health nurses are putting the silver alginate into the wound in contact with the wound surface. No concern for infection. 06/03/2022: 2-week follow-up. There is a little bit of callus buildup on around the wound and some slough on the surface. His dressing is not staying in place very well as it slides when he goes to put on his slippers. We have been using silver alginate, but it does not appear that the home health nurses are getting it actually into the wound, simply laying it across the orifice. 06/17/2022: 2-week follow-up. The patient reports that he has been having fevers and chills, although he is better today. The home health nurses also have told him that the wound is looking worse. On inspection, his foot is erythematous and the wound is deeper. T endon is now exposed. No purulent drainage, however. 06/24/2022: The culture that I took last week grew out multiple species. The recommended antibiotics were Bactrim and Augmentin. These were prescribed but the patient has not yet began to take them although he does have them in his possession. He has continued to have some chills at night. The wound is about the same size. His foot is less erythematous.  The tendon remains exposed at the base. There is slough accumulation. We have been using a topical gentamicin with silver alginate for his dressing change. Patient History Information obtained from Patient, Chart. Family History Heart Disease - Father, Stroke - Mother, No family history of Cancer, Diabetes, Hereditary Spherocytosis, Hypertension, Kidney Disease, Lung Disease, Seizures, Thyroid Problems, Tuberculosis. Social History Former smoker - quit 60 yrs ago, Marital Status - Widowed, Alcohol Use - Rarely, Drug Use - No History, Caffeine Use - Rarely. Medical History Eyes Patient has history of Cataracts - right removed Denies history of Glaucoma, Optic Neuritis Cardiovascular Patient has history of Angina - afib, Coronary Artery Disease, Hypertension, Peripheral Arterial Disease Endocrine Patient has history of Type II Diabetes Denies history of Type I Diabetes Genitourinary Denies history of End Stage Renal Disease Integumentary (Skin) Denies history of History of Burn Musculoskeletal Patient has history of Osteoarthritis - hands Neurologic Patient has history of Neuropathy Oncologic Denies history of Received Chemotherapy, Received Radiation Psychiatric Denies history of Anorexia/bulimia, Confinement Anxiety Hospitalization/Surgery History - CABG 3 vessel. - cholecystectomy. Medical A Surgical History Notes [name] Cardiovascular ischemic cardiomyopathy, hyperlipidemia Genitourinary CKD stage 4 Oncologic skin CA removed Objective  Constitutional Hypertensive, asymptomatic. Bradycardic, asymptomatic.Marland Kitchen No acute distress.. Vitals Time Taken: 1:48 PM, Height: 69 in, Weight: 195 lbs, BMI: 28.8, Temperature: 98.2 F, Pulse: 39 bpm, Respiratory Rate: 20 breaths/min, Blood Pressure: 155/58 mmHg, Capillary Blood Glucose: 144 mg/dl. Respiratory Normal work of breathing on room air.. General Notes: 06/24/2022: The wound is about the same size. His foot is less erythematous. The  tendon remains exposed at the base. Integumentary (Hair, Skin) Wound #1 status is Open. Original cause of wound was Gradually Appeared. The date acquired was: 03/25/2022. The wound has been in treatment 9 weeks. The wound is located on the Left Metatarsal head fifth. The wound measures 0.7cm length x 0.8cm width x 0.3cm depth; 0.44cm^2 area and 0.132cm^3 volume. There is Fat Layer (Subcutaneous Tissue) exposed. There is no tunneling or undermining noted. There is a medium amount of serosanguineous drainage noted. The wound margin is well defined and not attached to the wound base. There is medium (34-66%) red, pale granulation within the wound bed. There is a medium (34-66%) amount of necrotic tissue within the wound bed including Adherent Slough. 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 There was a Excisional Skin/Subcutaneous Tissue Debridement with a total area of 0.56 sq cm performed by Fredirick Maudlin, MD. With the following instrument(s): Curette to remove Non-Viable tissue/material. Material removed includes Subcutaneous Tissue and Slough and after achieving pain control using Lidocaine 4% Topical Solution. No specimens were taken. A time out was conducted at 13:52, prior to the start of the procedure. A Minimum amount of bleeding was controlled with Pressure. The procedure was tolerated well with a pain level of 0 throughout and a pain level of  0 following the procedure. Post Debridement Measurements: 0.7cm length x 0.8cm width x 0.3cm depth; 0.132cm^3 volume. Character of Wound/Ulcer Post Debridement is improved. Severity of Tissue Post Debridement is: Fat layer exposed. Post procedure Diagnosis Wound #1: Same as Pre-Procedure Plan 06/24/2022: The wound is about the same size. His foot is less erythematous. The tendon remains exposed at the base. I used a curette to debride slough and nonviable subcutaneous tissue from the patient's wound. I think he will start to feel better once he actually begins taking the oral antibiotics that were prescribed. The wound improvement is likely secondary to the topical gentamicin we have been applying. We are awaiting an MRI of his foot to evaluate for osteomyelitis. Continue gentamicin with silver alginate. Follow-up in 2 weeks. Electronic Signature(s) Signed: 06/24/2022 2:24:47 PM By: Fredirick Maudlin MD FACS Entered By: Fredirick Maudlin on 06/24/2022 14:24:47 -------------------------------------------------------------------------------- HxROS Details Patient Name: Date of Service: Steve [name], [name] 06/24/2022 2:00 PM Medical Record Number: [id number] Patient Account Number: [id number] Date of Birth/Sex: Treating RN: 04-05-1941 (81 y.o. Ernestene Mention Primary Care Provider: Maudry Mayhew Other Clinician: Referring Provider: Treating Provider/Extender: Horris Latino in Treatment: 9 Information Obtained From Patient Chart Eyes Medical History: Positive for: Cataracts - right removed Negative for: Glaucoma; Optic Neuritis Cardiovascular Medical History: Positive for: Angina - afib; Coronary Artery Disease; Hypertension; Peripheral Arterial Disease Past Medical History  Notes: ischemic cardiomyopathy, hyperlipidemia Endocrine Medical History: Positive for: Type II Diabetes Negative for: Type I Diabetes Time with diabetes: since 1999 Treated with: Insulin,  Oral agents Blood sugar tested every day: Yes Tested : once Genitourinary Medical History: Negative for: End Stage Renal Disease Past Medical History Notes: CKD stage 4 Integumentary (Skin) Medical History: Negative for: History of Burn Musculoskeletal Medical History: Positive for: Osteoarthritis - hands Neurologic Medical History: Positive for: Neuropathy Oncologic Medical History: Negative for: Received Chemotherapy; Received Radiation Past Medical History Notes: skin CA removed Psychiatric Medical History: Negative for: Anorexia/bulimia; Confinement Anxiety HBO Extended History Items Eyes: Cataracts Immunizations Pneumococcal Vaccine: Received Pneumococcal Vaccination: Yes Received Pneumococcal Vaccination On or After 60th Birthday: Yes Implantable Devices No devices added Hospitalization / Surgery History Type of Hospitalization/Surgery CABG 3 vessel cholecystectomy Family and Social History Cancer: No; Diabetes: No; Heart Disease: Yes - Father; Hereditary Spherocytosis: No; Hypertension: No; Kidney Disease: No; Lung Disease: No; Seizures: No; Stroke: Yes - Mother; Thyroid Problems: No; Tuberculosis: No; Former smoker - quit 60 yrs ago; Marital Status - Widowed; Alcohol Use: Rarely; Drug Use: No History; Caffeine Use: Rarely; Financial Concerns: No; Food, Clothing or Shelter Needs: No; Support System Lacking: No; Transportation Concerns: No Engineer, maintenance) Signed: 06/24/2022 2:27:27 PM By: Fredirick Maudlin MD FACS Signed: 06/24/2022 6:25:40 PM By: Baruch Gouty RN, BSN Entered By: Fredirick Maudlin on 06/24/2022 14:21:26 -------------------------------------------------------------------------------- SuperBill Details Patient Name: Date of Service: Steve [name], [name] 06/24/2022 Medical Record Number: [id number] Patient Account Number: [id number] Date of Birth/Sex: Treating RN: 1941-05-07 (81 y.o. 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 E11.621 Type 2 diabetes mellitus with foot ulcer I73.9 Peripheral vascular disease, unspecified N18.4 Chronic kidney disease, stage 4 (severe) I25.10 Atherosclerotic heart disease of native coronary artery without angina pectoris I10 Essential (primary) hypertension Facility Procedures CPT4 Code: [id number] Description: 11031 - DEB SUBQ TISSUE 20 SQ CM/< ICD-10 Diagnosis Description L97.525 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 w Modifier: ithout evidence of ne Quantity: 1 crosis Physician Procedures : CPT4 Code Description Modifier [id number] - WC PHYS LEVEL 4 - EST PT 25 ICD-10 Diagnosis Description L97.525 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

## 2022-06-24 NOTE — Progress Notes (Signed)
Steve [name], Steve [name] ([id number]) Visit Report for 06/24/2022 Arrival Information Details Patient Name: Date of Service: Steve [name], Steve [name] 06/24/2022 2:00 PM Medical Record Number: [id number] Patient Account Number: [id number] Date of Birth/Sex: Treating RN: 1940-11-23 (81 y.o. Waldron Session Primary Care Rodrigues Urbanek: Maudry Mayhew Other Clinician: Referring Sherrell Weir: Treating Arney Mayabb/Extender: Horris Latino in Treatment: 9 Visit Information History Since Last Visit All ordered tests and consults were completed: Yes Patient Arrived: Gilford Rile Added or deleted any medications: No Arrival Time: 13:47 Any new allergies or adverse reactions: No Accompanied By: self Had a fall or experienced change in No Transfer Assistance: None activities of daily living that may affect Patient Has Alerts: Yes risk of falls: Patient Alerts: Patient on Blood Thinner Signs or symptoms of abuse/neglect since last visito No R ABI = N/C TBI = .30 Hospitalized since last visit: No L ABI = .57 TBI = .20 Implantable device outside of the clinic excluding No cellular tissue based products placed in the center since last visit: Has Dressing in Place as Prescribed: Yes Pain Present Now: No Electronic Signature(s) Signed: 06/24/2022 5:00:23 PM By: Blanche East RN Entered By: Blanche East on 06/24/2022 13:48:45 -------------------------------------------------------------------------------- Encounter Discharge Information Details Patient Name: Date of Service: Steve ND, [name] 06/24/2022 2:00 PM Medical Record Number: [id number] Patient Account Number: [id number] Date of Birth/Sex: Treating RN: 03/13/1941 (81 y.o. Collene Gobble Primary Care Darral Rishel: Maudry Mayhew Other Clinician: Referring Rhylynn Perdomo: Treating Rock Sobol/Extender: Horris Latino in Treatment: 9 Encounter Discharge Information Items Post Procedure Vitals Discharge Condition: Stable Temperature  (F): 98.2 Ambulatory Status: Ambulatory Pulse (bpm): 39 Discharge Destination: Home Respiratory Rate (breaths/min): 20 Transportation: Private Auto Blood Pressure (mmHg): 155/58 Accompanied By: self Schedule Follow-up Appointment: Yes Clinical Summary of Care: Patient Declined Electronic Signature(s) Signed: 06/24/2022 5:12:10 PM By: Dellie Catholic RN Entered By: Dellie Catholic on 06/24/2022 16:39:53 -------------------------------------------------------------------------------- Lower Extremity Assessment Details Patient Name: Date of Service: Steve ND, [name] 06/24/2022 2:00 PM Medical Record Number: [id number] Patient Account Number: [id number] Date of Birth/Sex: Treating RN: December 11, 1940 (81 y.o. Waldron Session Primary Care Jorgina Binning: Maudry Mayhew Other Clinician: Referring Carnel Stegman: Treating Anastasia Tompson/Extender: Horris Latino in Treatment: 9 Edema Assessment Assessed: [Left: No] [Right: No] Edema: [Left: Ye] [Right: s] Calf Left: Right: Point of Measurement: From Medial Instep 34.5 cm Ankle Left: Right: Point of Measurement: From Medial Instep 23 cm Vascular Assessment Pulses: Dorsalis Pedis Palpable: [Left:Yes] Electronic Signature(s) Signed: 06/24/2022 5:00:23 PM By: Blanche East RN Entered By: Blanche East on 06/24/2022 13:50:50 -------------------------------------------------------------------------------- Multi Wound Chart Details Patient Name: Date of Service: Steve ND, [name] 06/24/2022 2:00 PM Medical Record Number: [id number] Patient Account Number: [id number] Date of Birth/Sex: Treating RN: [birth year] (81 y.o. Ernestene Mention Primary Care Damond Borchers: Maudry Mayhew Other Clinician: Referring Javel Hersh: Treating Janisha Bueso/Extender: Horris Latino in Treatment: 9 Vital Signs Height(in): 65 Capillary Blood Glucose(mg/dl): 144 Weight(lbs): 195 Pulse(bpm): 41 Body Mass Index(BMI): 28.8 Blood  Pressure(mmHg): 155/58 Temperature(F): 98.2 Respiratory Rate(breaths/min): 20 Photos: [N/A:N/A] Left Metatarsal head fifth N/A N/A Wound Location: Gradually Appeared N/A N/A Wounding Event: Diabetic Wound/Ulcer of the Lower N/A N/A Primary Etiology: Extremity Cataracts, Angina, Coronary Artery N/A N/A Comorbid History: Disease, Hypertension, Peripheral Arterial Disease, Type II Diabetes, Osteoarthritis, Neuropathy 03/25/2022 N/A N/A Date Acquired: 9 N/A N/A Weeks of Treatment: Open N/A N/A Wound Status: No N/A N/A Wound Recurrence: 0.7x0.8x0.3 N/A N/A Measurements L x W x D (cm) 0.44 N/A N/A A (cm) : rea 0.132 N/A N/A  Volume (cm) : -519.70% N/A N/A % Reduction in A rea: -371.40% N/A N/A % Reduction in Volume: Grade 2 N/A N/A Classification: Medium N/A N/A Exudate A mount: Serosanguineous N/A N/A Exudate Type: red, brown N/A N/A Exudate Color: Well defined, not attached N/A N/A Wound Margin: Medium (34-66%) N/A N/A Granulation A mount: Red, Pale N/A N/A Granulation Quality: Medium (34-66%) N/A N/A Necrotic A mount: Fat Layer (Subcutaneous Tissue): Yes N/A N/A Exposed Structures: Fascia: No Tendon: No Muscle: No Joint: No Bone: No None N/A N/A Epithelialization: Treatment Notes Electronic Signature(s) Signed: 06/24/2022 2:18:36 PM By: Fredirick Maudlin MD FACS Signed: 06/24/2022 6:25:40 PM By: Baruch Gouty RN, BSN Entered By: Fredirick Maudlin on 06/24/2022 14:18:36 -------------------------------------------------------------------------------- Multi-Disciplinary Care Plan Details Patient Name: Date of Service: Steve ND, [name] 06/24/2022 2:00 PM Medical Record Number: [id number] Patient Account Number: [id number] Date of Birth/Sex: Treating RN: 12/03/1940 (81 y.o. Collene Gobble Primary Care Haset O[place]: Maudry Mayhew Other Clinician: Referring Lillyian Heidt: Treating Brysyn Brandenberger/Extender: Horris Latino in Treatment:  [place] reviewed with physician Active Inactive Nutrition Nursing Diagnoses: Impaired glucose control: actual or potential Potential for alteratiion in Nutrition/Potential for imbalanced nutrition Goals: Patient/caregiver will maintain therapeutic glucose control Date Initiated: 04/20/2022 Target Resolution Date: 07/15/2022 Goal Status: Active Interventions: Assess HgA1c results as ordered upon admission and as needed Assess patient nutrition upon admission and as needed per policy Treatment Activities: Giving encouragement to exercise : 04/20/2022 Patient referred to Primary Care Physician for further nutritional evaluation : 04/20/2022 Notes: Wound/Skin Impairment Nursing Diagnoses: Impaired tissue integrity Knowledge deficit related to ulceration/compromised skin integrity Goals: Patient/caregiver will verbalize understanding of skin care regimen Date Initiated: 04/20/2022 Target Resolution Date: 07/15/2022 Goal Status: Active Ulcer/skin breakdown will have a volume reduction of 30% by week 4 Date Initiated: 04/20/2022 Date Inactivated: 05/20/2022 Target Resolution Date: 05/18/2022 Goal Status: Unmet Unmet Reason: difficulty offloading Interventions: Assess patient/caregiver ability to obtain necessary supplies Assess patient/caregiver ability to perform ulcer/skin care regimen upon admission and as needed Assess ulceration(s) every visit Provide education on ulcer and skin care Treatment Activities: Skin care regimen initiated : 04/20/2022 Topical wound management initiated : 04/20/2022 Notes: Electronic Signature(s) Signed: 06/24/2022 5:12:10 PM By: Dellie Catholic RN Entered By: Dellie Catholic on 06/24/2022 16:54:27 -------------------------------------------------------------------------------- Pain Assessment Details Patient Name: Date of Service: Steve ND, Valin C. 06/24/2022 2:00 PM Medical Record Number: [id number] Patient Account Number:  [id number] Date of Birth/Sex: Treating RN: 1941/03/30 (81 y.o. Waldron Session Primary Care Macala Baldonado: Maudry Mayhew Other Clinician: Referring Tayona Sarnowski: Treating Ranon Coven/Extender: Horris Latino in Treatment: 9 Active Problems Location of Pain Severity and Description of Pain Patient Has Paino No Site Locations Pain Management and Medication Current Pain Management: Electronic Signature(s) Signed: 06/24/2022 5:00:23 PM By: Blanche East RN Entered By: Blanche East on 06/24/2022 13:49:41 -------------------------------------------------------------------------------- Patient/Caregiver Education Details Patient Name: Date of Service: Steve ND, Shriyans Loletha Grayer 8/31/2023andnbsp2:00 PM Medical Record Number: [id number] Patient Account Number: [id number] Date of Birth/Gender: Treating RN: 1941-05-11 (81 y.o. Collene Gobble Primary Care Physician: Maudry Mayhew Other Clinician: Referring Physician: Treating Physician/Extender: Horris Latino in Treatment: 9 Education Assessment Education Provided To: Patient Education Topics Provided Wound/Skin Impairment: Methods: Explain/Verbal Responses: Return demonstration correctly Electronic Signature(s) Signed: 06/24/2022 5:12:10 PM By: Dellie Catholic RN Entered By: Dellie Catholic on 06/24/2022 16:54:39 -------------------------------------------------------------------------------- Wound Assessment Details Patient Name: Date of Service: Steve ND, Rayson C. 06/24/2022 2:00 PM Medical Record Number: [id number] Patient Account Number: [id number] Date of Birth/Sex: Treating RN: 1941/02/07 (81 y.o.  Waldron Session Primary Care Noreene Boreman: Maudry Mayhew Other Clinician: Referring Julia Alkhatib: Treating Furman Trentman/Extender: Horris Latino in Treatment: 9 Wound Status Wound Number: 1 Primary Diabetic Wound/Ulcer of the Lower Extremity Etiology: Wound Location: Left  Metatarsal head fifth Wound Open Wounding Event: Gradually Appeared Status: Date Acquired: 03/25/2022 Comorbid Cataracts, Angina, Coronary Artery Disease, Hypertension, Weeks Of Treatment: 9 History: Peripheral Arterial Disease, Type II Diabetes, Osteoarthritis, Clustered Wound: No Neuropathy Photos Wound Measurements Length: (cm) 0.7 Width: (cm) 0.8 Depth: (cm) 0.3 Area: (cm) 0.44 Volume: (cm) 0.132 % Reduction in Area: -519.7% % Reduction in Volume: -371.4% Epithelialization: None Tunneling: No Undermining: No Wound Description Classification: Grade 2 Wound Margin: Well defined, not attached Exudate Amount: Medium Exudate Type: Serosanguineous Exudate Color: red, brown Foul Odor After Cleansing: No Slough/Fibrino No Wound Bed Granulation Amount: Medium (34-66%) Exposed Structure Granulation Quality: Red, Pale Fascia Exposed: No Necrotic Amount: Medium (34-66%) Fat Layer (Subcutaneous Tissue) Exposed: Yes Necrotic Quality: Adherent Slough Tendon Exposed: No Muscle Exposed: No Joint Exposed: No Bone Exposed: No Treatment Notes Wound #1 (Metatarsal head fifth) Wound Laterality: Left Cleanser Peri-Wound Care Topical Gentamicin Discharge Instruction: thin layer to wound bed with dressing changes Primary Dressing KerraCel Ag Gelling Fiber Dressing, 2x2 in (silver alginate) Discharge Instruction: Apply silver alginate to wound bed as instructed Secondary Dressing Optifoam Non-Adhesive Dressing, 4x4 in Discharge Instruction: Apply over primary dressing cut to make foam donut Woven Gauze Sponges 2x2 in Discharge Instruction: Apply over primary dressing as directed. Secured With 22M Medipore H Soft Cloth Surgical T ape, 4 x 10 (in/yd) Discharge Instruction: Secure with tape as directed. Compression Wrap Compression Stockings Add-Ons Electronic Signature(s) Signed: 06/24/2022 5:00:23 PM By: Blanche East RN Entered By: Blanche East on 06/24/2022  13:54:52 -------------------------------------------------------------------------------- Vitals Details Patient Name: Date of Service: Steve ND, Dagmawi C. 06/24/2022 2:00 PM Medical Record Number: [id number] Patient Account Number: [id number] Date of Birth/Sex: Treating RN: 12-Jun-1941 (81 y.o. Waldron Session Primary Care Krystina Strieter: Maudry Mayhew Other Clinician: Referring Tamilyn Lupien: Treating Shateka Petrea/Extender: Horris Latino in Treatment: 9 Vital Signs Time Taken: 13:48 Temperature (F): 98.2 Height (in): 69 Pulse (bpm): 39 Weight (lbs): 195 Respiratory Rate (breaths/min): 20 Body Mass Index (BMI): 28.8 Blood Pressure (mmHg): 155/58 Capillary Blood Glucose (mg/dl): 144 Reference Range: 80 - 120 mg / dl Electronic Signature(s) Signed: 06/24/2022 5:00:23 PM By: Blanche East RN Entered By: Blanche East on 06/24/2022 13:49:36

## 2022-06-25 ENCOUNTER — Other Ambulatory Visit: Payer: Self-pay

## 2022-06-25 ENCOUNTER — Inpatient Hospital Stay (HOSPITAL_COMMUNITY)
Admission: EM | Admit: 2022-06-25 | Discharge: 2022-07-13 | DRG: 854 | Disposition: A | Discharge status: Home Health | Payer: Medicare Other | Attending: Internal Medicine | Admitting: Internal Medicine

## 2022-06-25 DIAGNOSIS — I4891 Unspecified atrial fibrillation: Secondary | ICD-10-CM | POA: Diagnosis not present

## 2022-06-25 DIAGNOSIS — I70248 Atherosclerosis of native arteries of left leg with ulceration of other part of lower left leg: Secondary | ICD-10-CM | POA: Diagnosis present

## 2022-06-25 DIAGNOSIS — E871 Hypo-osmolality and hyponatremia: Secondary | ICD-10-CM | POA: Diagnosis present

## 2022-06-25 DIAGNOSIS — E1165 Type 2 diabetes mellitus with hyperglycemia: Secondary | ICD-10-CM | POA: Diagnosis present

## 2022-06-25 DIAGNOSIS — I70249 Atherosclerosis of native arteries of left leg with ulceration of unspecified site: Secondary | ICD-10-CM | POA: Diagnosis not present

## 2022-06-25 DIAGNOSIS — E11621 Type 2 diabetes mellitus with foot ulcer: Secondary | ICD-10-CM | POA: Diagnosis present

## 2022-06-25 DIAGNOSIS — I2510 Atherosclerotic heart disease of native coronary artery without angina pectoris: Secondary | ICD-10-CM | POA: Diagnosis not present

## 2022-06-25 DIAGNOSIS — N179 Acute kidney failure, unspecified: Secondary | ICD-10-CM | POA: Diagnosis present

## 2022-06-25 DIAGNOSIS — Z888 Allergy status to other drugs, medicaments and biological substances status: Secondary | ICD-10-CM

## 2022-06-25 DIAGNOSIS — I10 Essential (primary) hypertension: Secondary | ICD-10-CM | POA: Diagnosis not present

## 2022-06-25 DIAGNOSIS — N1831 Chronic kidney disease, stage 3a: Secondary | ICD-10-CM | POA: Diagnosis present

## 2022-06-25 DIAGNOSIS — Z87891 Personal history of nicotine dependence: Secondary | ICD-10-CM

## 2022-06-25 DIAGNOSIS — Z01810 Encounter for preprocedural cardiovascular examination: Secondary | ICD-10-CM | POA: Diagnosis not present

## 2022-06-25 DIAGNOSIS — I5042 Chronic combined systolic (congestive) and diastolic (congestive) heart failure: Secondary | ICD-10-CM

## 2022-06-25 DIAGNOSIS — R338 Other retention of urine: Secondary | ICD-10-CM | POA: Diagnosis present

## 2022-06-25 DIAGNOSIS — I739 Peripheral vascular disease, unspecified: Secondary | ICD-10-CM | POA: Diagnosis present

## 2022-06-25 DIAGNOSIS — E1169 Type 2 diabetes mellitus with other specified complication: Secondary | ICD-10-CM | POA: Diagnosis present

## 2022-06-25 DIAGNOSIS — M869 Osteomyelitis, unspecified: Secondary | ICD-10-CM | POA: Diagnosis present

## 2022-06-25 DIAGNOSIS — I5020 Unspecified systolic (congestive) heart failure: Secondary | ICD-10-CM | POA: Diagnosis not present

## 2022-06-25 DIAGNOSIS — D62 Acute posthemorrhagic anemia: Secondary | ICD-10-CM | POA: Diagnosis not present

## 2022-06-25 DIAGNOSIS — Z20822 Contact with and (suspected) exposure to covid-19: Secondary | ICD-10-CM | POA: Diagnosis present

## 2022-06-25 DIAGNOSIS — G9340 Encephalopathy, unspecified: Secondary | ICD-10-CM | POA: Diagnosis not present

## 2022-06-25 DIAGNOSIS — A419 Sepsis, unspecified organism: Principal | ICD-10-CM | POA: Diagnosis present

## 2022-06-25 DIAGNOSIS — L03116 Cellulitis of left lower limb: Secondary | ICD-10-CM | POA: Diagnosis present

## 2022-06-25 DIAGNOSIS — E860 Dehydration: Secondary | ICD-10-CM | POA: Diagnosis present

## 2022-06-25 DIAGNOSIS — I255 Ischemic cardiomyopathy: Secondary | ICD-10-CM | POA: Diagnosis present

## 2022-06-25 DIAGNOSIS — M86179 Other acute osteomyelitis, unspecified ankle and foot: Secondary | ICD-10-CM | POA: Diagnosis not present

## 2022-06-25 DIAGNOSIS — L97529 Non-pressure chronic ulcer of other part of left foot with unspecified severity: Secondary | ICD-10-CM | POA: Diagnosis present

## 2022-06-25 DIAGNOSIS — R339 Retention of urine, unspecified: Secondary | ICD-10-CM | POA: Diagnosis present

## 2022-06-25 DIAGNOSIS — N401 Enlarged prostate with lower urinary tract symptoms: Secondary | ICD-10-CM | POA: Diagnosis present

## 2022-06-25 DIAGNOSIS — I480 Paroxysmal atrial fibrillation: Secondary | ICD-10-CM | POA: Diagnosis present

## 2022-06-25 DIAGNOSIS — N189 Chronic kidney disease, unspecified: Secondary | ICD-10-CM | POA: Diagnosis not present

## 2022-06-25 DIAGNOSIS — E876 Hypokalemia: Secondary | ICD-10-CM | POA: Diagnosis present

## 2022-06-25 DIAGNOSIS — Z7901 Long term (current) use of anticoagulants: Secondary | ICD-10-CM

## 2022-06-25 DIAGNOSIS — E872 Acidosis, unspecified: Secondary | ICD-10-CM | POA: Diagnosis present

## 2022-06-25 DIAGNOSIS — D696 Thrombocytopenia, unspecified: Secondary | ICD-10-CM | POA: Diagnosis present

## 2022-06-25 DIAGNOSIS — M86672 Other chronic osteomyelitis, left ankle and foot: Secondary | ICD-10-CM | POA: Diagnosis not present

## 2022-06-25 DIAGNOSIS — R9431 Abnormal electrocardiogram [ECG] [EKG]: Secondary | ICD-10-CM | POA: Diagnosis present

## 2022-06-25 DIAGNOSIS — I130 Hypertensive heart and chronic kidney disease with heart failure and stage 1 through stage 4 chronic kidney disease, or unspecified chronic kidney disease: Secondary | ICD-10-CM | POA: Diagnosis present

## 2022-06-25 DIAGNOSIS — Z7984 Long term (current) use of oral hypoglycemic drugs: Secondary | ICD-10-CM | POA: Diagnosis not present

## 2022-06-25 DIAGNOSIS — E861 Hypovolemia: Secondary | ICD-10-CM | POA: Diagnosis present

## 2022-06-25 DIAGNOSIS — Z794 Long term (current) use of insulin: Secondary | ICD-10-CM

## 2022-06-25 DIAGNOSIS — K5900 Constipation, unspecified: Secondary | ICD-10-CM | POA: Diagnosis not present

## 2022-06-25 DIAGNOSIS — E1151 Type 2 diabetes mellitus with diabetic peripheral angiopathy without gangrene: Secondary | ICD-10-CM | POA: Diagnosis present

## 2022-06-25 DIAGNOSIS — Z79899 Other long term (current) drug therapy: Secondary | ICD-10-CM

## 2022-06-25 DIAGNOSIS — E782 Mixed hyperlipidemia: Secondary | ICD-10-CM | POA: Diagnosis not present

## 2022-06-25 DIAGNOSIS — E1122 Type 2 diabetes mellitus with diabetic chronic kidney disease: Secondary | ICD-10-CM | POA: Diagnosis present

## 2022-06-25 DIAGNOSIS — E119 Type 2 diabetes mellitus without complications: Secondary | ICD-10-CM

## 2022-06-25 DIAGNOSIS — E785 Hyperlipidemia, unspecified: Secondary | ICD-10-CM | POA: Diagnosis present

## 2022-06-25 DIAGNOSIS — Z823 Family history of stroke: Secondary | ICD-10-CM

## 2022-06-25 DIAGNOSIS — I4510 Unspecified right bundle-branch block: Secondary | ICD-10-CM | POA: Diagnosis present

## 2022-06-25 DIAGNOSIS — Z9049 Acquired absence of other specified parts of digestive tract: Secondary | ICD-10-CM

## 2022-06-25 DIAGNOSIS — I70245 Atherosclerosis of native arteries of left leg with ulceration of other part of foot: Secondary | ICD-10-CM | POA: Diagnosis not present

## 2022-06-25 HISTORY — DX: Hypo-osmolality and hyponatremia: E87.1

## 2022-06-25 LAB — COMPREHENSIVE METABOLIC PANEL
ALT: 15 U/L (ref 0–44)
AST: 24 U/L (ref 15–41)
Albumin: 3.3 g/dL — ABNORMAL LOW (ref 3.5–5.0)
Alkaline Phosphatase: 50 U/L (ref 38–126)
Anion gap: 10 (ref 5–15)
BUN: 28 mg/dL — ABNORMAL HIGH (ref 8–23)
CO2: 18 mmol/L — ABNORMAL LOW (ref 22–32)
Calcium: 8.5 mg/dL — ABNORMAL LOW (ref 8.9–10.3)
Chloride: 98 mmol/L (ref 98–111)
Creatinine, Ser: 2.47 mg/dL — ABNORMAL HIGH (ref 0.61–1.24)
GFR, Estimated: 25 mL/min — ABNORMAL LOW (ref >60)
Glucose, Bld: 208 mg/dL — ABNORMAL HIGH (ref 70–99)
Potassium: 4.1 mmol/L (ref 3.5–5.1)
Sodium: 126 mmol/L — ABNORMAL LOW (ref 135–145)
Total Bilirubin: 0.6 mg/dL (ref 0.3–1.2)
Total Protein: 6.7 g/dL (ref 6.5–8.1)

## 2022-06-25 LAB — CBC WITH DIFFERENTIAL/PLATELET
Abs Immature Granulocytes: 0.04 10*3/uL (ref 0.00–0.07)
Basophils Absolute: 0 10*3/uL (ref 0.0–0.1)
Basophils Relative: 0 %
Eosinophils Absolute: 0.1 10*3/uL (ref 0.0–0.5)
Eosinophils Relative: 1 %
HCT: 29.8 % — ABNORMAL LOW (ref 39.0–52.0)
Hemoglobin: 10.5 g/dL — ABNORMAL LOW (ref 13.0–17.0)
Immature Granulocytes: 0 %
Lymphocytes Relative: 3 %
Lymphs Abs: 0.3 10*3/uL — ABNORMAL LOW (ref 0.7–4.0)
MCH: 31.3 pg (ref 26.0–34.0)
MCHC: 35.2 g/dL (ref 30.0–36.0)
MCV: 88.7 fL (ref 80.0–100.0)
Monocytes Absolute: 0.2 10*3/uL (ref 0.1–1.0)
Monocytes Relative: 2 %
Neutro Abs: 9.1 10*3/uL — ABNORMAL HIGH (ref 1.7–7.7)
Neutrophils Relative %: 94 %
Platelets: 132 10*3/uL — ABNORMAL LOW (ref 150–400)
RBC: 3.36 MIL/uL — ABNORMAL LOW (ref 4.22–5.81)
RDW: 14.6 % (ref 11.5–15.5)
WBC: 9.6 10*3/uL (ref 4.0–10.5)
nRBC: 0 % (ref 0.0–0.2)

## 2022-06-25 LAB — SARS CORONAVIRUS 2 BY RT PCR: SARS Coronavirus 2 by RT PCR: NEGATIVE

## 2022-06-25 LAB — GLUCOSE, CAPILLARY: Glucose-Capillary: 166 mg/dL — ABNORMAL HIGH (ref 70–99)

## 2022-06-25 LAB — LACTIC ACID, PLASMA
Lactic Acid, Venous: 2.6 mmol/L (ref 0.5–1.9)
Lactic Acid, Venous: 1.2 mmol/L (ref 0.5–1.9)

## 2022-06-25 LAB — HEMOGLOBIN A1C
Hgb A1c MFr Bld: 7.9 % — ABNORMAL HIGH (ref 4.8–5.6)
Mean Plasma Glucose: 180.03 mg/dL

## 2022-06-25 LAB — MAGNESIUM: Magnesium: 1.6 mg/dL — ABNORMAL LOW (ref 1.7–2.4)

## 2022-06-25 LAB — AMMONIA: Ammonia: 18 umol/L (ref 9–35)

## 2022-06-25 LAB — OSMOLALITY: Osmolality: 275 mosm/kg (ref 275–295)

## 2022-06-25 LAB — PROCALCITONIN: Procalcitonin: 1.78 ng/mL

## 2022-06-25 LAB — TSH: TSH: 0.53 u[IU]/mL (ref 0.350–4.500)

## 2022-06-25 LAB — PHOSPHORUS: Phosphorus: 2.8 mg/dL (ref 2.5–4.6)

## 2022-06-25 MED ORDER — OXYCODONE HCL 5 MG PO TABS
5.0000 mg | ORAL_TABLET | ORAL | Status: DC | PRN
  Filled 2022-06-25: qty 1

## 2022-06-25 MED ORDER — DOCUSATE SODIUM 100 MG PO CAPS
100.0000 mg | ORAL_CAPSULE | Freq: Two times a day (BID) | ORAL | Status: DC
  Administered 2022-06-25 – 2022-07-13 (×22): 100 mg via ORAL
  Filled 2022-06-25 (×26): qty 1

## 2022-06-25 MED ORDER — ACETAMINOPHEN 650 MG RE SUPP: 650.0000 mg | Freq: Four times a day (QID) | RECTAL | Status: DC | PRN

## 2022-06-25 MED ORDER — POLYETHYLENE GLYCOL 3350 17 G PO PACK
17.0000 g | PACK | Freq: Every day | ORAL | Status: DC | PRN
  Administered 2022-07-11: 17 g via ORAL
  Filled 2022-06-25: qty 1

## 2022-06-25 MED ORDER — ONDANSETRON HCL 4 MG/2ML IJ SOLN
4.0000 mg | Freq: Four times a day (QID) | INTRAMUSCULAR | Status: DC | PRN
Start: 2022-06-25 — End: 2022-07-11

## 2022-06-25 MED ORDER — LABETALOL HCL 5 MG/ML IV SOLN: 10.0000 mg | Freq: Once | INTRAVENOUS | Status: DC

## 2022-06-25 MED ORDER — INSULIN ASPART 100 UNIT/ML IJ SOLN
0.0000 [IU] | Freq: Three times a day (TID) | INTRAMUSCULAR | Status: DC
  Administered 2022-06-26 (×2): 2 [IU] via SUBCUTANEOUS
  Administered 2022-06-26: 3 [IU] via SUBCUTANEOUS
  Administered 2022-06-27 – 2022-07-01 (×4): 2 [IU] via SUBCUTANEOUS
  Administered 2022-07-01: 3 [IU] via SUBCUTANEOUS
  Administered 2022-07-02 – 2022-07-03 (×3): 2 [IU] via SUBCUTANEOUS
  Administered 2022-07-04: 3 [IU] via SUBCUTANEOUS
  Administered 2022-07-04 – 2022-07-06 (×5): 2 [IU] via SUBCUTANEOUS
  Administered 2022-07-06: 3 [IU] via SUBCUTANEOUS
  Administered 2022-07-06 – 2022-07-07 (×2): 2 [IU] via SUBCUTANEOUS
  Administered 2022-07-08 (×2): 5 [IU] via SUBCUTANEOUS
  Administered 2022-07-08: 3 [IU] via SUBCUTANEOUS
  Administered 2022-07-09: 2 [IU] via SUBCUTANEOUS
  Administered 2022-07-10: 8 [IU] via SUBCUTANEOUS
  Administered 2022-07-10: 5 [IU] via SUBCUTANEOUS
  Administered 2022-07-10: 11 [IU] via SUBCUTANEOUS
  Administered 2022-07-11: 3 [IU] via SUBCUTANEOUS
  Administered 2022-07-11 (×2): 5 [IU] via SUBCUTANEOUS
  Administered 2022-07-12 (×2): 3 [IU] via SUBCUTANEOUS
  Administered 2022-07-13: 2 [IU] via SUBCUTANEOUS

## 2022-06-25 MED ORDER — SODIUM CHLORIDE 0.9 % IV BOLUS
500.0000 mL | Freq: Once | INTRAVENOUS | Status: AC
Start: 2022-06-25 — End: 2022-06-25
  Administered 2022-06-25: 500 mL via INTRAVENOUS

## 2022-06-25 MED ORDER — LACTATED RINGERS IV BOLUS (SEPSIS)
1000.0000 mL | Freq: Once | INTRAVENOUS | Status: AC
  Administered 2022-06-25: 1000 mL via INTRAVENOUS

## 2022-06-25 MED ORDER — BISACODYL 5 MG PO TBEC
5.0000 mg | DELAYED_RELEASE_TABLET | Freq: Every day | ORAL | Status: DC | PRN
  Administered 2022-07-11: 5 mg via ORAL
  Filled 2022-06-25: qty 1

## 2022-06-25 MED ORDER — ONDANSETRON HCL 4 MG PO TABS
4.0000 mg | ORAL_TABLET | Freq: Four times a day (QID) | ORAL | Status: DC | PRN
Start: 2022-06-25 — End: 2022-07-11

## 2022-06-25 MED ORDER — VANCOMYCIN HCL 1500 MG/300ML IV SOLN: 1500.0000 mg | INTRAVENOUS | Status: DC

## 2022-06-25 MED ORDER — LORAZEPAM 2 MG/ML IJ SOLN: 0.5000 mg | Freq: Once | INTRAMUSCULAR | Status: DC

## 2022-06-25 MED ORDER — METOPROLOL SUCCINATE ER 100 MG PO TB24
100.0000 mg | ORAL_TABLET | Freq: Every day | ORAL | Status: DC
  Administered 2022-06-25 – 2022-07-12 (×18): 100 mg via ORAL
  Filled 2022-06-25: qty 1
  Filled 2022-06-25 (×2): qty 4
  Filled 2022-06-25: qty 1
  Filled 2022-06-25: qty 4
  Filled 2022-06-25 (×4): qty 1
  Filled 2022-06-25: qty 4
  Filled 2022-06-25 (×3): qty 1
  Filled 2022-06-25: qty 4
  Filled 2022-06-25: qty 1
  Filled 2022-06-25: qty 4
  Filled 2022-06-25: qty 1
  Filled 2022-06-25: qty 4

## 2022-06-25 MED ORDER — GABAPENTIN 100 MG PO CAPS
100.0000 mg | ORAL_CAPSULE | Freq: Every day | ORAL | Status: DC
  Administered 2022-06-25 – 2022-07-12 (×17): 100 mg via ORAL
  Filled 2022-06-25 (×18): qty 1

## 2022-06-25 MED ORDER — ALBUTEROL SULFATE (2.5 MG/3ML) 0.083% IN NEBU: 2.5000 mg | INHALATION_SOLUTION | RESPIRATORY_TRACT | Status: DC | PRN

## 2022-06-25 MED ORDER — INSULIN ASPART 100 UNIT/ML IJ SOLN
0.0000 [IU] | Freq: Every day | INTRAMUSCULAR | Status: DC
  Administered 2022-07-07: 2 [IU] via SUBCUTANEOUS
  Administered 2022-07-09: 3 [IU] via SUBCUTANEOUS
  Administered 2022-07-10: 2 [IU] via SUBCUTANEOUS

## 2022-06-25 MED ORDER — METRONIDAZOLE 500 MG/100ML IV SOLN
500.0000 mg | Freq: Two times a day (BID) | INTRAVENOUS | Status: DC
  Administered 2022-06-25 – 2022-06-28 (×6): 500 mg via INTRAVENOUS
  Filled 2022-06-25 (×6): qty 100

## 2022-06-25 MED ORDER — SODIUM CHLORIDE 0.9 % IV SOLN
2.0000 g | Freq: Once | INTRAVENOUS | Status: AC
  Administered 2022-06-25: 2 g via INTRAVENOUS
  Filled 2022-06-25: qty 12.5

## 2022-06-25 MED ORDER — SODIUM CHLORIDE 0.9% FLUSH
3.0000 mL | Freq: Two times a day (BID) | INTRAVENOUS | Status: DC
  Administered 2022-06-28 – 2022-07-12 (×15): 3 mL via INTRAVENOUS

## 2022-06-25 MED ORDER — MORPHINE SULFATE (PF) 2 MG/ML IV SOLN: 2.0000 mg | INTRAVENOUS | Status: DC | PRN

## 2022-06-25 MED ORDER — DOXAZOSIN MESYLATE 2 MG PO TABS
2.0000 mg | ORAL_TABLET | Freq: Every day | ORAL | Status: DC
  Administered 2022-06-26 – 2022-07-01 (×6): 2 mg via ORAL
  Filled 2022-06-25 (×6): qty 1

## 2022-06-25 MED ORDER — CYCLOBENZAPRINE HCL 10 MG PO TABS
10.0000 mg | ORAL_TABLET | Freq: Two times a day (BID) | ORAL | Status: DC | PRN
Start: 2022-06-25 — End: 2022-07-13
  Administered 2022-07-09: 10 mg via ORAL
  Filled 2022-06-25 (×2): qty 1

## 2022-06-25 MED ORDER — SODIUM CHLORIDE 0.9 % IV SOLN: 2.0000 g | INTRAVENOUS | Status: DC

## 2022-06-25 MED ORDER — VANCOMYCIN HCL 2000 MG/400ML IV SOLN
2000.0000 mg | Freq: Once | INTRAVENOUS | Status: AC
  Administered 2022-06-25: 2000 mg via INTRAVENOUS
  Filled 2022-06-25: qty 400

## 2022-06-25 MED ORDER — LACTATED RINGERS IV SOLN
INTRAVENOUS | Status: DC
Start: 2022-06-25 — End: 2022-07-07

## 2022-06-25 MED ORDER — INSULIN DETEMIR 100 UNIT/ML ~~LOC~~ SOLN
25.0000 [IU] | Freq: Every day | SUBCUTANEOUS | Status: DC
Start: 2022-06-26 — End: 2022-07-01
  Administered 2022-06-26 – 2022-07-01 (×6): 25 [IU] via SUBCUTANEOUS
  Filled 2022-06-25 (×6): qty 0.25

## 2022-06-25 MED ORDER — APIXABAN 2.5 MG PO TABS
2.5000 mg | ORAL_TABLET | Freq: Two times a day (BID) | ORAL | Status: DC
  Administered 2022-06-25 – 2022-06-29 (×8): 2.5 mg via ORAL
  Filled 2022-06-25 (×8): qty 1

## 2022-06-25 MED ORDER — ISOSORBIDE MONONITRATE ER 30 MG PO TB24
30.0000 mg | ORAL_TABLET | Freq: Every day | ORAL | Status: DC
  Administered 2022-06-26 – 2022-07-13 (×17): 30 mg via ORAL
  Filled 2022-06-25 (×17): qty 1

## 2022-06-25 MED ORDER — MAGNESIUM OXIDE -MG SUPPLEMENT 400 (240 MG) MG PO TABS
200.0000 mg | ORAL_TABLET | Freq: Once | ORAL | Status: AC
  Administered 2022-06-25: 200 mg via ORAL
  Filled 2022-06-25: qty 1

## 2022-06-25 MED ORDER — VANCOMYCIN HCL IN DEXTROSE 1-5 GM/200ML-% IV SOLN: 1000.0000 mg | Freq: Once | INTRAVENOUS | Status: DC

## 2022-06-25 MED ORDER — HYDRALAZINE HCL 20 MG/ML IJ SOLN: 5.0000 mg | INTRAMUSCULAR | Status: DC | PRN

## 2022-06-25 MED ORDER — ACETAMINOPHEN 325 MG PO TABS
650.0000 mg | ORAL_TABLET | Freq: Four times a day (QID) | ORAL | Status: DC | PRN
  Administered 2022-06-25 – 2022-07-04 (×5): 650 mg via ORAL
  Filled 2022-06-25 (×6): qty 2

## 2022-06-25 MED ORDER — EZETIMIBE 10 MG PO TABS
10.0000 mg | ORAL_TABLET | Freq: Every day | ORAL | Status: DC
  Administered 2022-06-26 – 2022-07-13 (×18): 10 mg via ORAL
  Filled 2022-06-25 (×18): qty 1

## 2022-06-25 NOTE — Sepsis Progress Note (Signed)
eLink is following this Code Sepsis. °

## 2022-06-25 NOTE — ED Notes (Signed)
Trauma Event Note    NON TRAUMA PT --   TRN assisted primary RN with pt intake- placing pt on monitor, getting pt undressed, and assessment.. IV started,  EKG given to EDP  Last imported Vital Signs BP (!) 174/72   Pulse (!) 101   Temp (!) 101.6 F (38.7 C) (Oral)   Resp (!) 33   SpO2 95%   Trending CBC Recent Labs    06/25/22 1550  WBC 9.6  HGB 10.5*  HCT 29.8*  PLT 132*    Trending Coag's No results for input(s): "APTT", "INR" in the last 72 hours.  Trending BMET Recent Labs    06/25/22 1550  NA 126*  K 4.1  CL 98  CO2 18*  BUN 28*  CREATININE 2.47*  GLUCOSE 208*      Steve [name] M Kaytlyn Din  Trauma Response RN  Please call TRN at [phone] for further assistance.

## 2022-06-25 NOTE — H&P (Signed)
History and Physical    Patient: Steve [name]. QXI:[id number] DOB: 1941/01/13 DOA: 06/25/2022 DOS: the patient was seen and examined on 06/25/2022 PCP: System, Provider Not In  Patient coming from: SNF - [facility]; NOK: Apolinar Junes [place], [phone] - unfortunately, he is on an [place] cruise and unable to be reached and he can't remember his grandson's number   Chief Complaint: Tremors  HPI: Steve [name]. is a 81 y.o. male with medical history significant of CAD s/p CABG; HTN; HLD; DM; PAD; and afib presenting with tremors. He reports that he felt bad today.  Some tremors, otherwise just under the weather.  No respiratory, urinary, GI symptoms.  He doesn't have other current complaints.   ER Course:  AKI, hyponatremia.  No appetite, poor PO intake.  ?COVID, temp 101.3.  Lactate 2.6.  No respiratory symptoms.     Review of Systems: As mentioned in the history of present illness. All other systems reviewed and are negative. Past Medical History:  Diagnosis Date   CAD (coronary artery disease)    a. s/p CABG x 29 Nov 2011 with LIMA to LAD, SVG to OM1 and distal LCX, SVG to ramus intermediate   Essential hypertension    Hyperlipidemia    Intermittent claudication ([place])    Ischemic cardiomyopathy    a. 03/2015 Echo: EF45-50%, Gr1 DD, mild MR.   PAF (paroxysmal atrial fibrillation) ([place])    a. post op atrial fib 11/2011; short course of amiodarone; stopped 01/25/12   Peripheral arterial disease ([place])    Right bundle branch block    Type II diabetes mellitus ([place])    Past Surgical History:  Procedure Laterality Date   CARDIAC CATHETERIZATION  01/16/1999   Est. EF of 65% -- Nonobstruction atherosclerotic coronary artery disease -- Normal left ventricular function       CARDIAC ELECTROPHYSIOLOGY STUDY AND ABLATION  05/21/1999   Normal sinus funtion -- Mildly prolonged interatrial conduction times -- Normal A-V node funtion -- Normal His Purkinje system function -- fNo accessory  pathway -- No inducible ventricular tachycardia in the presence of the or in the absence of isoproterenol with programmed stimulation or with burst pacing -- Nikki Dom, M.D. [place]   CORONARY ARTERY BYPASS GRAFT  12/10/2011   Procedure: CORONARY ARTERY BYPASS GRAFTING (CABG);  Surgeon: Tharon Aquas Adelene Idler, MD;  Location: [facility];  Service: Open Heart Surgery;  Laterality: N/A;  Coronary Artery bypass graft on pump times four utilizing left internal mammary artery and left saphenous vein harvested endoscopically   LEFT HEART CATH AND CORS/GRAFTS ANGIOGRAPHY N/A 12/18/2018   Procedure: LEFT HEART CATH AND CORS/GRAFTS ANGIOGRAPHY;  Surgeon: Belva Crome, MD;  Location: [facility];  Service: Cardiovascular;  Laterality: N/A;   LEFT HEART CATHETERIZATION WITH CORONARY ANGIOGRAM N/A 12/09/2011   Procedure: LEFT HEART CATHETERIZATION WITH CORONARY ANGIOGRAM;  Surgeon: Peter M Martinique, MD;  Location: [facility] CATH LAB;  Service: Cardiovascular;  Laterality: N/A;   LOWER EXTREMITY ANGIOGRAM Left 07/28/2018   Procedure: Lower Extremity Angiogram;  Surgeon: Angelia Mould, MD;  Location: [facility];  Service: Cardiovascular;  Laterality: Left;   Social History:  reports that he quit smoking about 43 years ago. His smoking use included cigarettes. He has a 30.00 pack-year smoking history. He has quit using smokeless tobacco. He reports current alcohol use of about 4.0 standard drinks of alcohol per  week. He reports that he does not use drugs.  Allergies  Allergen Reactions   Januvia [Sitagliptin] Other (See Comments)    Soreness to stomach area. Intolerance    Statins Other (See Comments)    Myalgias, muscle weakness, swelling, pain    Family History  Problem Relation Age of Onset   Stroke Mother 34       Cerebellar hemorrhage    Prior to Admission medications   Medication Sig Start Date End Date Taking? Authorizing Provider  blood glucose  meter kit and supplies Dispense based on patient and insurance preference. Use up to four times daily as directed. (FOR ICD-10 E10.9, E11.9). 04/18/19   Cristal Ford, DO  Cholecalciferol (VITAMIN D3) 50 MCG (2000 UT) capsule Take 2,000 Units by mouth daily. 11/23/21   [provider]  cyclobenzaprine (FLEXERIL) 10 MG tablet Take 1 tablet (10 mg total) by mouth 2 (two) times daily as needed for muscle spasms. 01/26/22   Gareth Morgan, MD  doxazosin (CARDURA) 2 MG tablet Take 2 mg by mouth daily. 11/30/21   [provider]  doxycycline (VIBRA-TABS) 100 MG tablet Take 1 tablet (100 mg total) by mouth 2 (two) times daily. 04/02/22   Trula Slade, DPM  ELIQUIS 2.5 MG TABS tablet Take 2.5 mg by mouth 2 (two) times daily. 11/23/21   [provider]  ezetimibe (ZETIA) 10 MG tablet Take 1 tablet (10 mg total) by mouth daily. 11/05/20   Martinique, Peter M, MD  gabapentin (NEURONTIN) 100 MG capsule Take 1 capsule (100 mg total) by mouth at bedtime. 12/01/21   Trula Slade, DPM  insulin detemir (LEVEMIR) 100 UNIT/ML injection Inject 0.18 mLs (18 Units total) into the skin daily. Patient taking differently: Inject 25 Units into the skin daily. 04/19/19   Mikhail, Velta Addison, DO  Insulin Syringe-Needle U-100 (INSULIN SYRINGE .3CC/31GX5/16") 31G X 5/16" 0.3 ML MISC Use with insulin 04/18/19   Mikhail, Velta Addison, DO  isosorbide mononitrate (IMDUR) 30 MG 24 hr tablet TAKE 1 TABLET DAILY (NEED OFFICE VISIT FOR REFILLS - 1ST ATTEMPT) Patient taking differently: Take 30 mg by mouth daily. 12/24/21   Almyra Deforest, PA  JANUVIA 50 MG tablet Take 50 mg by mouth daily. Patient not taking: Reported on 01/26/2022 12/23/21   [provider]  metFORMIN (GLUCOPHAGE) 1000 MG tablet Take 1,000 mg by mouth daily. 03/31/20   [provider]  metoprolol succinate (TOPROL-XL) 100 MG 24 hr tablet Take 1 tablet (100 mg total) by mouth daily. Take with or immediately following a meal. Patient taking  differently: Take 100 mg by mouth daily with supper. Take with or immediately following a meal. 12/19/18   Reino Bellis B, NP  silver sulfADIAZINE (SILVADENE) 1 % cream Apply 1 application  topically daily. 04/02/22   Trula Slade, DPM  tamsulosin (FLOMAX) 0.4 MG CAPS capsule Take 0.4 mg by mouth daily after supper.  Patient not taking: Reported on 01/26/2022 11/01/18   [provider]  vitamin B-12 (CYANOCOBALAMIN) 500 MCG tablet Take 500 mcg by mouth daily with breakfast.     [provider]  amLODipine (NORVASC) 10 MG tablet TAKE 1 TABLET DAILY Patient not taking: Reported on 05/20/2020 11/09/19 05/20/20  Lendon Colonel, NP  furosemide (LASIX) 40 MG tablet Take 1 tablet (40 mg total) by mouth daily. For 4 days then stop. 12/15/11 04/26/19  Nani Skillern, PA-C  insulin aspart (NOVOLOG) 100 UNIT/ML injection Inject 0-15 Units into the skin 3 (three) times daily with meals.  CBG 121 - 150: 2 units;  CBG 151 - 200: 3 units;  CBG 201 - 250: 5 units;  CBG 251 - 300: 8 units; CBG 301 - 350: 11 units; CBG 351 - 400: 15 units;  CBG > 400 : 15 units and notify MD Patient not taking: Reported on 05/20/2020 04/18/19 05/20/20  Cristal Ford, DO    Physical Exam: Vitals:   06/25/22 1700 06/25/22 1715 06/25/22 1730 06/25/22 1754  BP: (!) 161/63 (!) 170/66 (!) 174/72   Pulse: 99 (!) 102 (!) 101   Resp: (!) 27 (!) 36 (!) 33   Temp:    (!) 101.6 F (38.7 C)  TempSrc:    Oral  SpO2: 98% 98% 95%    General:  Appears calm and comfortable and is in NAD Eyes:  PERRL, EOMI, normal lids, iris ENT:  grossly normal hearing, lips & tongue, mmm; edentulous Neck:  no LAD, masses or thyromegaly Cardiovascular:  RRR, no m/r/g. No LE edema.  Respiratory:   CTA bilaterally with no wheezes/rales/rhonchi.  Normal respiratory effort. Abdomen:  soft, NT, ND Skin:  mild erythema on L anterior lower leg; clean ulcer on L 5th MTP base     Musculoskeletal:  grossly normal tone BUE/BLE,  good ROM, no bony abnormality Psychiatric:  grossly normal mood and affect, speech fluent and appropriate, AOx3 Neurologic:  CN 2-12 grossly intact, moves all extremities in coordinated fashion   Radiological Exams on Admission: Independently reviewed - see discussion in A/P where applicable  No results found.  EKG: Independently reviewed.  Sinus tachycardia with rate 106; prolonged QTc 505; RBBB; LVH with IVCD; nonspecific ST changes   Labs on Admission: I have personally reviewed the available labs and imaging studies at the time of the admission.  Pertinent labs:    Na++ 126 CO2 18 Glucose 208 BUN 28/Creatinine 2.47/GFR 25; 16/1.68/40 on 4/4 Lactate 2.6 WBC 9.6 Hgb 10.5 Platelets 132  TSH 0.530 COVID pending UA pending   Assessment and Plan: Principal Problem:   Sepsis due to undetermined organism [facility]) Active Problems:   Hypertension   PVD (peripheral vascular disease) ([place])   Hyperlipidemia   PAF (paroxysmal atrial fibrillation) ([facility])   Type II diabetes mellitus ([place])   Acute kidney injury superimposed on chronic kidney disease ([place])   Dehydration with hyponatremia    Sepsis due to uncertain etiology -SIRS criteria in this patient includes: Fever, tachycardia, tachypnea  -Patient has evidence of acute organ failure with elevated lactate >2 that is not easily explained by another condition. -While awaiting blood cultures, this appears to be a preseptic condition. -Sepsis protocol initiated -Suspected source is uncertain, possible UTI (UA pending) or COVID (in a facility) -Blood and urine cultures pending -Will admit due to: dehydration that is severe (as evidenced by AKI, hyponatremia) -Treat with IV Flagyl/Cefepime/Vanc for undifferentiated sepsis -Will trend lactate to ensure improvement -Will order procalcitonin level.  Antibiotics would not be indicated for PCT <0.1 and probably should not be used for < 0.25.  >0.5 indicates infection and >>0.5 indicates  more serious disease.  As the procalcitonin level normalizes, it will be reasonable to consider de-escalation of antibiotic coverage.  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, with hyponatremia due to dehydration -Likely due to hypovolemia in the setting of sepsis -Urine studies ordered by EDP -Attempt to avoid nephrotoxic medications -Recheck BMP in AM   CAD -s/p CABG -No current c/o CP -Continue Imdur  HTN -Continue doxazosin, Toprol XL  DM -Will check A1c; it was 10.8  in 03/2019, poor control -hold Glucophage, Januvia -Continue Levemir/glargine -Cover with moderate-scale SSI  -Continue gabapentin  HLD -Continue Zetia -Has a reported statin allergy  PVD with foot ulcer -Ulcer appears to be clean at this time -Mild LE edema, not obviously cellulitis and certainly not sufficient to cause sepsis by current evaluation -Wound care consult requested -No further current intervention  Afib -Rate controlled with Toprol XL -Continue Eliquis     Advance Care Planning:   Code Status: Full Code   Consults: Nutrition; PT/OT; wound care; TOC team  DVT Prophylaxis: Eliquis  Family Communication: None present and none currently available, unfortunately  Severity of Illness: The appropriate patient status for this patient is INPATIENT. Inpatient status is judged to be reasonable and necessary in order to provide the required intensity of service to ensure the patient's safety. The patient's presenting symptoms, physical exam findings, and initial radiographic and laboratory data in the context of their chronic comorbidities is felt to place them at high risk for further clinical deterioration. Furthermore, it is not anticipated that the patient will be medically stable for discharge from the hospital within 2 midnights of admission.   * I certify that at the point of admission it is my clinical judgment that the patient will require inpatient hospital care spanning beyond 2 midnights from the point of  admission due to high intensity of service, high risk for further deterioration and high frequency of surveillance required.*  Author: Karmen Bongo, MD 06/25/2022 6:27 PM  For on call review [email or web address].

## 2022-06-25 NOTE — ED Provider Notes (Signed)
[facility] EMERGENCY DEPARTMENT Provider Note   CSN: [id number] Arrival date & time: 06/25/22  1358     History  Chief Complaint  Patient presents with   Tremors    Steve [name]. is a 81 y.o. male with a past medical history of hypertension, PVD, electrolyte abnormalities and paroxysmal A-fib presenting today with 2 days worth of tremors.  He reports bilateral upper and lower extremity tremoring on Wednesday but that it went away.  Yesterday he started to tremor again.  Mostly in the upper extremities.  No history of alcohol use or withdrawal symptoms.  Reports he has also had decreased appetite.  No fevers, chills, congestion, rhinorrhea, chest pain or shortness of breath.  HPI     Home Medications Prior to Admission medications   Medication Sig Start Date End Date Taking? Authorizing Provider  blood glucose meter kit and supplies Dispense based on patient and insurance preference. Use up to four times daily as directed. (FOR ICD-10 E10.9, E11.9). 04/18/19   Cristal Ford, DO  Cholecalciferol (VITAMIN D3) 50 MCG (2000 UT) capsule Take 2,000 Units by mouth daily. 11/23/21   [provider]  cyclobenzaprine (FLEXERIL) 10 MG tablet Take 1 tablet (10 mg total) by mouth 2 (two) times daily as needed for muscle spasms. 01/26/22   Gareth Morgan, MD  doxazosin (CARDURA) 2 MG tablet Take 2 mg by mouth daily. 11/30/21   [provider]  doxycycline (VIBRA-TABS) 100 MG tablet Take 1 tablet (100 mg total) by mouth 2 (two) times daily. 04/02/22   Trula Slade, DPM  ELIQUIS 2.5 MG TABS tablet Take 2.5 mg by mouth 2 (two) times daily. 11/23/21   [provider]  ezetimibe (ZETIA) 10 MG tablet Take 1 tablet (10 mg total) by mouth daily. 11/05/20   Martinique, Peter M, MD  gabapentin (NEURONTIN) 100 MG capsule Take 1 capsule (100 mg total) by mouth at bedtime. 12/01/21   Trula Slade, DPM  insulin detemir (LEVEMIR) 100 UNIT/ML injection Inject 0.18  mLs (18 Units total) into the skin daily. Patient taking differently: Inject 25 Units into the skin daily. 04/19/19   Mikhail, Velta Addison, DO  Insulin Syringe-Needle U-100 (INSULIN SYRINGE .3CC/31GX5/16") 31G X 5/16" 0.3 ML MISC Use with insulin 04/18/19   Mikhail, Velta Addison, DO  isosorbide mononitrate (IMDUR) 30 MG 24 hr tablet TAKE 1 TABLET DAILY (NEED OFFICE VISIT FOR REFILLS - 1ST ATTEMPT) Patient taking differently: Take 30 mg by mouth daily. 12/24/21   Almyra Deforest, PA  JANUVIA 50 MG tablet Take 50 mg by mouth daily. Patient not taking: Reported on 01/26/2022 12/23/21   [provider]  metFORMIN (GLUCOPHAGE) 1000 MG tablet Take 1,000 mg by mouth daily. 03/31/20   [provider]  metoprolol succinate (TOPROL-XL) 100 MG 24 hr tablet Take 1 tablet (100 mg total) by mouth daily. Take with or immediately following a meal. Patient taking differently: Take 100 mg by mouth daily with supper. Take with or immediately following a meal. 12/19/18   Reino Bellis B, NP  silver sulfADIAZINE (SILVADENE) 1 % cream Apply 1 application  topically daily. 04/02/22   Trula Slade, DPM  tamsulosin (FLOMAX) 0.4 MG CAPS capsule Take 0.4 mg by mouth daily after supper.  Patient not taking: Reported on 01/26/2022 11/01/18   [provider]  vitamin B-12 (CYANOCOBALAMIN) 500 MCG tablet Take 500 mcg by mouth daily with breakfast.     [provider]  amLODipine (NORVASC) 10 MG tablet TAKE 1  TABLET DAILY Patient not taking: Reported on 05/20/2020 11/09/19 05/20/20  Lendon Colonel, NP  furosemide (LASIX) 40 MG tablet Take 1 tablet (40 mg total) by mouth daily. For 4 days then stop. 12/15/11 04/26/19  Nani Skillern, PA-C  insulin aspart (NOVOLOG) 100 UNIT/ML injection Inject 0-15 Units into the skin 3 (three) times daily with meals. CBG 121 - 150: 2 units;  CBG 151 - 200: 3 units;  CBG 201 - 250: 5 units;  CBG 251 - 300: 8 units; CBG 301 - 350: 11 units; CBG 351 - 400: 15 units;  CBG > 400  : 15 units and notify MD Patient not taking: Reported on 05/20/2020 04/18/19 05/20/20  Cristal Ford, DO      Allergies    Januvia [sitagliptin] and Statins    Review of Systems   Review of Systems  Physical Exam Updated Vital Signs BP (!) 180/70 (BP Location: Left Arm)   Pulse 96   Temp 99.9 F (37.7 C) (Oral)   Resp 20   SpO2 98%  Physical Exam Vitals and nursing note reviewed.  Constitutional:      Appearance: Normal appearance.  HENT:     Head: Normocephalic and atraumatic.  Eyes:     General: No scleral icterus.    Conjunctiva/sclera: Conjunctivae normal.  Cardiovascular:     Rate and Rhythm: Normal rate and regular rhythm.  Pulmonary:     Effort: Pulmonary effort is normal. No respiratory distress.  Skin:    Findings: No rash.  Neurological:     Mental Status: He is alert.     Comments: Tremor noted with finger-to-nose.  Mild akinesia of bilateral upper extremities.  No tremoring noted in lower extremities.  Psychiatric:        Mood and Affect: Mood normal.     ED Results / Procedures / Treatments   Labs (all labs ordered are listed, but only abnormal results are displayed) Labs Reviewed - No data to display  EKG None  Radiology No results found.  Procedures .Critical Care  Performed by: Rhae Hammock, PA-C Authorized by: Rhae Hammock, PA-C   Critical care provider statement:    Critical care time (minutes):  30   Critical care was necessary to treat or prevent imminent or life-threatening deterioration of the following conditions:  Dehydration and metabolic crisis   Critical care was time spent personally by me on the following activities:  Development of treatment plan with patient or surrogate, discussions with consultants, evaluation of patient's response to treatment, examination of patient, ordering and review of laboratory studies, ordering and review of radiographic studies, ordering and performing treatments and interventions,  pulse oximetry, re-evaluation of patient's condition and review of old charts   I assumed direction of critical care for this patient from another provider in my specialty: no     Care discussed with: admitting provider      Medications Ordered in ED Medications - No data to display  ED Course/ Medical Decision Making/ A&P                           Medical Decision Making Amount and/or Complexity of Data Reviewed Labs: ordered.  Risk OTC drugs. Prescription drug management. Decision regarding hospitalization.   This is a 81 year old male who presents to the ED for concern of upper extremity tremor.  Differential includes but is not limited to electrolyte abnormality, essential tremor, Parkinson's, thyroid abnormality, hepatic encephalopathy, or other  substance.  This is not an exhaustive differential.    Past Medical History / Co-morbidities / Social History: Type 2 diabetes, hypertension   Additional history: Per chart review patient is not on any medications that should cause the symptoms.  No antipsychotics, lithium, valproic acid or serotonin drugs.   Physical Exam: Pertinent physical exam findings include Tremor of bilateral upper hands.  Present while resting as well and is worse with intentional movement No noted tremors in the bilateral lower extremities Borderline febrile and borderline tachycardic.  Lab Tests: I ordered, and personally interpreted labs.  The pertinent results include: Hyponatremia 126 Creatinine 2.47, above patient's baseline in the.  1.2-1.6 range BUN 28, GFR 20 Hemoglobin around baseline at 10.5 Magnesium 1.6 COVID pending Lactic 2.6   Imaging Studies: Consider chest x-ray however patient denies any cough, chest pain or shortness of breath at this time   Cardiac Monitoring:  The patient was maintained on a cardiac monitor.  My attending physician Dr. Maryan Rued viewed and interpreted the cardiac monitored which showed an underlying rhythm  of: Sinus rhythm.   Medications: I ordered medication including fluid boluses and magnesium.   MDM/Disposition: This is a 81 year old male presenting today with upper extremity tremors.  Work-up revealing of a sodium of 126.  Corrected to 128 given hyperglycemia.  Patient also has an AKI.  Both of these are likely secondary to patient's decreased appetite and p.o. intake.  After patient's work-up today, I feel that he requires admission to the hospital for his AKI and hyponatremia.  He is agreeable to admission at this time.    I discussed this case with my attending physician Dr. Maryan Rued who cosigned this note including patient's presenting symptoms, physical exam, and planned diagnostics and interventions. Attending physician stated agreement with plan or made changes to plan which were implemented.     Final Clinical Impression(s) / ED Diagnoses Final diagnoses:  Hyponatremia  AKI (acute kidney injury) ([place])    Rx / DC Orders Admit to Dr. Stanford Scotland, [place] A, PA-C 06/25/22 [id number]    Blanchie Dessert, MD 07/03/22 1709

## 2022-06-25 NOTE — ED Triage Notes (Signed)
Patient BIB [facility] from [facility] for new onset tremor that started today. Patient also reports some mild abdominal pain and dysuria. EMS reports patient also has a diabetic ulcer on top of left foot. Patient is alert and in no apparent distress at this time.  EMS Vitals 150/70 HR 96 RR 18

## 2022-06-25 NOTE — Progress Notes (Signed)
Pt reports inability to void, despite attempts by staff. Reports "pressure" and discomfort in abdomen. Bladder scan performed-787 ml noted. Dr. Marlowe Sax paged at this time, awaiting response.

## 2022-06-25 NOTE — Sepsis Progress Note (Signed)
Notified bedside nurse of need to draw blood cultures and repeat lactic acid. She confirmed that lab was drawing now.

## 2022-06-25 NOTE — Progress Notes (Signed)
Pharmacy Antibiotic Note  Steve [name]. is a 81 y.o. male admitted on 06/25/2022 with sepsis.  Pharmacy has been consulted for Cefepime and Vancomycin dosing.  WBC 9.6, Tmax 101.6, LA 2.6 SCr 2.47 (baseline SCr ~1.6)  Plan: Start Cefepime 2g IV q24h Initiate loading dose of Vancomycin 2000mg  IV x 1, followed by  Vancomycin 1500mg  IV q48h (eAUC ~474)    > Goal AUC 400-550    > Check vancomycin levels at steady state  Continue Metronidazole 500mg  IV q12h per MD Monitor daily CBC, temp, SCr, and for clinical signs of improvement  F/u cultures and de-escalate antibiotics as able   Height: 5\' 9"  (175.3 cm) Weight: 94.8 kg (209 lb) IBW/kg (Calculated) : 70.7  Temp (24hrs), Avg:100.8 F (38.2 C), Min:99.9 F (37.7 C), Max:101.6 F (38.7 C)  Recent Labs  Lab 06/25/22 1550 06/25/22 1640  WBC 9.6  --   CREATININE 2.47*  --   LATICACIDVEN  --  2.6*    Estimated Creatinine Clearance: 25.7 mL/min (A) (by C-G formula based on SCr of 2.47 mg/dL (H)).    Allergies  Allergen Reactions   Januvia [Sitagliptin] Other (See Comments)    Soreness to stomach area. Intolerance    Statins Other (See Comments)    Myalgias, muscle weakness, swelling, pain    Antimicrobials this admission: Cefepime 9/1 >>  Metronidazole 9/1 >>  Vancomycin 9/1 >>   Dose adjustments this admission: N/A  Microbiology results: 9/1 BCx: pending 9/1 UCx: pending   Thank you for allowing pharmacy to be a part of this patient's care.  Luisa Hart, PharmD, BCPS Clinical Pharmacist 06/25/2022 7:32 PM   Please refer to [facility] for pharmacy phone number

## 2022-06-25 NOTE — ED Notes (Signed)
ED TO INPATIENT HANDOFF REPORT  ED Nurse Name and Phone #: Marbeth Smedley RN [phone]  S Name/Age/Gender Steve [name]. 81 y.o. male Room/Bed: 012C/012C  Code Status   Code Status: Prior  Home/SNF/Other Home Patient oriented to: self, place, time, and situation Is this baseline? Yes   Triage Complete: Triage complete  Chief Complaint Sepsis due to undetermined organism [facility]) [A41.9]  Triage Note Patient BIB [facility] from [facility] for new onset tremor that started today. Patient also reports some mild abdominal pain and dysuria. EMS reports patient also has a diabetic ulcer on top of left foot. Patient is alert and in no apparent distress at this time.  EMS Vitals 150/70 HR 96 RR 18   Allergies Allergies  Allergen Reactions   Januvia [Sitagliptin] Other (See Comments)    Soreness to stomach area. Intolerance    Statins Other (See Comments)    Myalgias, muscle weakness, swelling, pain    Level of Care/Admitting Diagnosis ED Disposition     ED Disposition  Admit   Condition  --   [place]: [place] [[id number]]  Level of Care: Telemetry Medical [[id number]]  May admit patient to [facility] or [facility] if equivalent level of care is available:: Yes  Covid Evaluation: Symptomatic Person Under Investigation (PUI) or recent exposure (last 10 days) *Testing Required*  Diagnosis: Sepsis due to undetermined organism [facility]) [[id number]]  Admitting Physician: Karmen Bongo [[id number]]  Attending Physician: Karmen Bongo [[id number]]  Certification:: I certify this patient will need inpatient services for at least 2 midnights  Estimated Length of Stay: 3          B Medical/Surgery History Past Medical History:  Diagnosis Date   CAD (coronary artery disease)    a. s/p CABG x 29 Nov 2011 with LIMA to LAD, SVG to OM1 and distal LCX, SVG to ramus intermediate   Essential hypertension    Hyperlipidemia    Intermittent claudication ([place])    Ischemic  cardiomyopathy    a. 03/2015 Echo: EF45-50%, Gr1 DD, mild MR.   PAF (paroxysmal atrial fibrillation) ([place])    a. post op atrial fib 11/2011; short course of amiodarone; stopped 01/25/12   Peripheral arterial disease ([place])    Right bundle branch block    Type II diabetes mellitus ([place])    Past Surgical History:  Procedure Laterality Date   CARDIAC CATHETERIZATION  01/16/1999   Est. EF of 65% -- Nonobstruction atherosclerotic coronary artery disease -- Normal left ventricular function       CARDIAC ELECTROPHYSIOLOGY STUDY AND ABLATION  05/21/1999   Normal sinus funtion -- Mildly prolonged interatrial conduction times -- Normal A-V node funtion -- Normal His Purkinje system function -- fNo accessory pathway -- No inducible ventricular tachycardia in the presence of the or in the absence of isoproterenol with programmed stimulation or with burst pacing -- Nikki Dom, M.D. [place]   CORONARY ARTERY BYPASS GRAFT  12/10/2011   Procedure: CORONARY ARTERY BYPASS GRAFTING (CABG);  Surgeon: Tharon Aquas Adelene Idler, MD;  Location: [facility];  Service: Open Heart Surgery;  Laterality: N/A;  Coronary Artery bypass graft on pump times four utilizing left internal mammary artery and left saphenous vein harvested endoscopically   LEFT HEART CATH AND CORS/GRAFTS ANGIOGRAPHY N/A 12/18/2018   Procedure: LEFT HEART CATH AND CORS/GRAFTS ANGIOGRAPHY;  Surgeon: Belva Crome, MD;  Location: [place] CV LAB;  Service: Cardiovascular;  Laterality: N/A;   LEFT HEART CATHETERIZATION WITH CORONARY ANGIOGRAM N/A 12/09/2011   Procedure: LEFT HEART CATHETERIZATION WITH CORONARY ANGIOGRAM;  Surgeon: Peter M Martinique, MD;  Location: [facility] CATH LAB;  Service: Cardiovascular;  Laterality: N/A;   LOWER EXTREMITY ANGIOGRAM Left 07/28/2018   Procedure: Lower Extremity Angiogram;  Surgeon: Angelia Mould, MD;  Location: [place] CV LAB;  Service: Cardiovascular;  Laterality: Left;     A IV  Location/Drains/Wounds Patient Lines/Drains/Airways Status     Active Line/Drains/Airways     Name Placement date Placement time Site Days   Peripheral IV 06/25/22 20 G Anterior;Left;Proximal Forearm 06/25/22  1642  Forearm  less than 1            Intake/Output Last 24 hours No intake or output data in the 24 hours ending 06/25/22 1800  Labs/Imaging Results for orders placed or performed during the hospital encounter of 06/25/22 (from the past 48 hour(s))  Comprehensive metabolic panel     Status: Abnormal   Collection Time: 06/25/22  3:50 PM  Result Value Ref Range   Sodium 126 (L) 135 - 145 mmol/L   Potassium 4.1 3.5 - 5.1 mmol/L   Chloride 98 98 - 111 mmol/L   CO2 18 (L) 22 - 32 mmol/L   Glucose, Bld 208 (H) 70 - 99 mg/dL    Comment: Glucose reference range applies only to samples taken after fasting for at least 8 hours.   BUN 28 (H) 8 - 23 mg/dL   Creatinine, Ser 2.47 (H) 0.61 - 1.24 mg/dL   Calcium 8.5 (L) 8.9 - 10.3 mg/dL   Total Protein 6.7 6.5 - 8.1 g/dL   Albumin 3.3 (L) 3.5 - 5.0 g/dL   AST 24 15 - 41 U/L   ALT 15 0 - 44 U/L   Alkaline Phosphatase 50 38 - 126 U/L   Total Bilirubin 0.6 0.3 - 1.2 mg/dL   GFR, Estimated 25 (L) >60 mL/min    Comment: (NOTE) Calculated using the CKD-EPI Creatinine Equation (2021)    Anion gap 10 5 - 15    Comment: Performed at [facility], [place] [address], [place], [place] [address]  CBC with Differential     Status: Abnormal   Collection Time: 06/25/22  3:50 PM  Result Value Ref Range   WBC 9.6 4.0 - 10.5 K/uL   RBC 3.36 (L) 4.22 - 5.81 MIL/uL   Hemoglobin 10.5 (L) 13.0 - 17.0 g/dL   HCT 29.8 (L) 39.0 - 52.0 %   MCV 88.7 80.0 - 100.0 fL   MCH 31.3 26.0 - 34.0 pg   MCHC 35.2 30.0 - 36.0 g/dL   RDW 14.6 11.5 - 15.5 %   Platelets 132 (L) 150 - 400 K/uL   nRBC 0.0 0.0 - 0.2 %   Neutrophils Relative % 94 %   Neutro Abs 9.1 (H) 1.7 - 7.7 K/uL   Lymphocytes Relative 3 %   Lymphs Abs 0.3 (L) 0.7 - 4.0 K/uL   Monocytes  Relative 2 %   Monocytes Absolute 0.2 0.1 - 1.0 K/uL   Eosinophils Relative 1 %   Eosinophils Absolute 0.1 0.0 - 0.5 K/uL   Basophils Relative 0 %   Basophils Absolute 0.0 0.0 - 0.1 K/uL   Immature Granulocytes 0 %   Abs Immature Granulocytes 0.04 0.00 - 0.07 K/uL    Comment: Performed at [facility], [place] [address], [place], [place] [address]  Magnesium  Status: Abnormal   Collection Time: 06/25/22  3:50 PM  Result Value Ref Range   Magnesium 1.6 (L) 1.7 - 2.4 mg/dL    Comment: Performed at [place] [address], [place], [place] [address]  TSH     Status: None   Collection Time: 06/25/22  3:50 PM  Result Value Ref Range   TSH 0.530 0.350 - 4.500 uIU/mL    Comment: Performed by a 3rd Generation assay with a functional sensitivity of <=0.01 uIU/mL. Performed at [facility], [place] [address], [place], [place] [address]   Ammonia     Status: None   Collection Time: 06/25/22  3:50 PM  Result Value Ref Range   Ammonia 18 9 - 35 umol/L    Comment: Performed at [facility], [place] [address] [place], [place] [address]  Lactic acid, plasma     Status: Abnormal   Collection Time: 06/25/22  4:40 PM  Result Value Ref Range   Lactic Acid, Venous 2.6 (HH) 0.5 - 1.9 mmol/L    Comment: CRITICAL RESULT CALLED TO, READ BACK BY AND VERIFIED WITH C.Lando Alcalde,RN @1741  06/25/2022 VANG.J Performed at [facility], [place] [address] [place], [place] [address]    No results found.  Pending Labs Unresulted Labs (From admission, onward)     Start     Ordered   06/25/22 1726  Osmolality, urine  Once,   URGENT        06/25/22 1726   06/25/22 1725  Sodium, urine, random  Once,   URGENT        06/25/22 1726   06/25/22 1725  Phosphorus  Once,   STAT        06/25/22 1726   06/25/22 1725  Osmolality  Once,   URGENT        06/25/22 1726   06/25/22 1632  Lactic acid, plasma  Now then every 2 hours,   R (with STAT occurrences)      06/25/22 1631   06/25/22 1445   Urinalysis, Routine w reflex microscopic  Once,   URGENT        06/25/22 1444   06/25/22 1445  Urine Culture  (Urine Culture)  Once,   URGENT       Question:  Indication  Answer:  Dysuria   06/25/22 1444   06/25/22 1444  SARS Coronavirus 2 by RT PCR (hospital order, performed in [place] hospital lab) *cepheid single result test* Anterior Nasal Swab  (Tier 2 - Symptomatic/Asymptomatic)  Once,   URGENT        06/25/22 1444            Vitals/Pain Today's Vitals   06/25/22 1700 06/25/22 1715 06/25/22 1730 06/25/22 1754  BP: (!) 161/63 (!) 170/66 (!) 174/72   Pulse: 99 (!) 102 (!) 101   Resp: (!) 27 (!) 36 (!) 33   Temp:    (!) 101.6 F (38.7 C)  TempSrc:    Oral  SpO2: 98% 98% 95%   PainSc:        Isolation Precautions No active isolations  Medications Medications  magnesium oxide (MAG-OX) tablet 200 mg (has no administration in time range)  labetalol (NORMODYNE) injection 10 mg (has no administration in time range)  sodium chloride 0.9 % bolus 500 mL (500 mLs Intravenous New Bag/Given 06/25/22 1756)    Mobility walks with person assist Low fall risk    R Recommendations: See Admitting Provider Note  Report given  to: Aurther Loft RN  Additional Notes:

## 2022-06-26 ENCOUNTER — Encounter (HOSPITAL_COMMUNITY): Payer: Self-pay | Admitting: Internal Medicine

## 2022-06-26 ENCOUNTER — Inpatient Hospital Stay (HOSPITAL_COMMUNITY): Payer: Medicare Other

## 2022-06-26 DIAGNOSIS — E871 Hypo-osmolality and hyponatremia: Secondary | ICD-10-CM

## 2022-06-26 DIAGNOSIS — A419 Sepsis, unspecified organism: Secondary | ICD-10-CM | POA: Diagnosis not present

## 2022-06-26 DIAGNOSIS — I739 Peripheral vascular disease, unspecified: Secondary | ICD-10-CM

## 2022-06-26 DIAGNOSIS — N189 Chronic kidney disease, unspecified: Secondary | ICD-10-CM

## 2022-06-26 DIAGNOSIS — E860 Dehydration: Secondary | ICD-10-CM | POA: Diagnosis not present

## 2022-06-26 DIAGNOSIS — N179 Acute kidney failure, unspecified: Secondary | ICD-10-CM | POA: Diagnosis not present

## 2022-06-26 DIAGNOSIS — I480 Paroxysmal atrial fibrillation: Secondary | ICD-10-CM

## 2022-06-26 LAB — CBC
HCT: 23.3 % — ABNORMAL LOW (ref 39.0–52.0)
Hemoglobin: 8.6 g/dL — ABNORMAL LOW (ref 13.0–17.0)
MCH: 32 pg (ref 26.0–34.0)
MCHC: 36.9 g/dL — ABNORMAL HIGH (ref 30.0–36.0)
MCV: 86.6 fL (ref 80.0–100.0)
Platelets: 103 10*3/uL — ABNORMAL LOW (ref 150–400)
RBC: 2.69 MIL/uL — ABNORMAL LOW (ref 4.22–5.81)
RDW: 14.3 % (ref 11.5–15.5)
WBC: 6.9 10*3/uL (ref 4.0–10.5)
nRBC: 0 % (ref 0.0–0.2)

## 2022-06-26 LAB — BASIC METABOLIC PANEL
Anion gap: 11 (ref 5–15)
BUN: 23 mg/dL (ref 8–23)
CO2: 14 mmol/L — ABNORMAL LOW (ref 22–32)
Calcium: 8 mg/dL — ABNORMAL LOW (ref 8.9–10.3)
Chloride: 103 mmol/L (ref 98–111)
Creatinine, Ser: 1.91 mg/dL — ABNORMAL HIGH (ref 0.61–1.24)
GFR, Estimated: 34 mL/min — ABNORMAL LOW (ref >60)
Glucose, Bld: 177 mg/dL — ABNORMAL HIGH (ref 70–99)
Potassium: 4 mmol/L (ref 3.5–5.1)
Sodium: 128 mmol/L — ABNORMAL LOW (ref 135–145)

## 2022-06-26 LAB — URINALYSIS, ROUTINE W REFLEX MICROSCOPIC
Bacteria, UA: NONE SEEN
Bilirubin Urine: NEGATIVE
Glucose, UA: 50 mg/dL — AB
Hgb urine dipstick: NEGATIVE
Ketones, ur: NEGATIVE mg/dL
Leukocytes,Ua: NEGATIVE
Nitrite: NEGATIVE
Protein, ur: 100 mg/dL — AB
Specific Gravity, Urine: 1.013 (ref 1.005–1.030)
pH: 5 (ref 5.0–8.0)

## 2022-06-26 LAB — GLUCOSE, CAPILLARY
Glucose-Capillary: 157 mg/dL — ABNORMAL HIGH (ref 70–99)
Glucose-Capillary: 138 mg/dL — ABNORMAL HIGH (ref 70–99)
Glucose-Capillary: 134 mg/dL — ABNORMAL HIGH (ref 70–99)
Glucose-Capillary: 123 mg/dL — ABNORMAL HIGH (ref 70–99)

## 2022-06-26 LAB — PROCALCITONIN: Procalcitonin: 1.99 ng/mL

## 2022-06-26 MED ORDER — MAGNESIUM HYDROXIDE 400 MG/5ML PO SUSP
960.0000 mL | Freq: Once | ORAL | Status: DC
  Filled 2022-06-26: qty 473

## 2022-06-26 MED ORDER — SODIUM CHLORIDE 0.9 % IV SOLN
2.0000 g | Freq: Two times a day (BID) | INTRAVENOUS | Status: DC
  Administered 2022-06-26 – 2022-06-29 (×7): 2 g via INTRAVENOUS
  Filled 2022-06-26 (×8): qty 12.5

## 2022-06-26 MED ORDER — BISACODYL 10 MG RE SUPP
10.0000 mg | Freq: Once | RECTAL | Status: AC
Start: 2022-06-26 — End: 2022-06-26
  Administered 2022-06-26: 10 mg via RECTAL
  Filled 2022-06-26: qty 1

## 2022-06-26 NOTE — Progress Notes (Signed)
   06/26/22 0000  Assess: MEWS Score  Temp 100.1 F (37.8 C)  BP (!) 180/72  MAP (mmHg) 94  Pulse Rate (!) 115  Resp 20  SpO2 97 %  O2 Device Room Air  Assess: MEWS Score  MEWS Temp 0  MEWS Systolic 0  MEWS Pulse 2  MEWS RR 0  MEWS LOC 0  MEWS Score 2  MEWS Score Color Yellow  Assess: if the MEWS score is Yellow or Red  Were vital signs taken at a resting state? No (pt noted with generalized tremors)  Focused Assessment No change from prior assessment  Does the patient meet 2 or more of the SIRS criteria? Yes  Does the patient have a confirmed or suspected source of infection? No  MEWS guidelines implemented *See Row Information* Yes  Treat  MEWS Interventions Administered scheduled meds/treatments  Pain Scale 0-10  Pain Score 0  Take Vital Signs  Increase Vital Sign Frequency  Yellow: Q 2hr X 2 then Q 4hr X 2, if remains yellow, continue Q 4hrs  Escalate  MEWS: Escalate Yellow: discuss with charge nurse/RN and consider discussing with provider and RRT  Notify: Charge Nurse/RN  Name of Charge Nurse/RN Notified [place]  Date Charge Nurse/RN Notified 06/26/22  Time Charge Nurse/RN Notified 0005  Document  Patient Outcome Stabilized after interventions  Progress note created (see row info) Yes  Assess: SIRS CRITERIA  SIRS Temperature  0  SIRS Pulse 1  SIRS Respirations  0  SIRS WBC 1  SIRS Score Sum  2

## 2022-06-26 NOTE — Hospital Course (Addendum)
Steve [name]. is a 81 y.o. male with a history of CAD s/p CABG, hypertension, hyperlipidemia, diabetes mellitus type 2, PAD, atrial fibrillation. Patient presented secondary to tremors and was found to have fevers with concern for possible infection. SIRS criteria met without source identified. Patient started empirically on Vancomycin, Cefepime and Flagyl. Blood and urine cultures obtained. MRI significant for osteomyelitis. Podiatry and vascular surgery consulted. S/P angiogram on 9/8.  Recommendation is for femoropopliteal bypass currently scheduled on 9/13.  Will remain on IV heparin until then.

## 2022-06-26 NOTE — Consult Note (Signed)
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 Followed by the outpatient wound care center by Dr. Celine Ahr. Last seen on Thursday, 06/24/22. Please see note from that encounter. Wound type:neuropathic Pressure Injury POA: N/A Measurement:Per Dr. Celine Ahr on 06/24/22: 0.7cm x 0.8cm x 0.3cm Wound bed:Red, moist with tendon evident Drainage (amount, consistency, odor) moderate Periwound:erythematous, intact, dry Dressing procedure/placement/frequency: I will follow the POC initiated by Dr. Celine Ahr with the exception of applying topical gentamycin to the wound bed prior to placing a silver hydrofiber (Aquacel Ag+ Advantage). Bedside RN will cleanse and place silver hydrofiber, then top with dry gauze and secure with silicone foam for atraumatic dressing changes. Prophylactic PI interventions such as floatation of the heels and placement of a silicone foam to the sacrum are initiated.  If desired, consult with Orthopedics or Podiatric Medicine while in house. For evaluation and input into the POC.  Patient to follow up with Dr. Celine Ahr at the outpatient wound care center post discharge as directed.  [facility] nursing team will not follow, but will remain available to this patient, the nursing and medical teams.  Please re-consult if needed.  Thank you for inviting [place] to participate in this patient's Plan of Care.  Maudie Flakes, MSN, RN, CNS, [place], Serita Grammes, [employer], [employer] phone:  [phone]

## 2022-06-26 NOTE — Plan of Care (Signed)
  Problem: Fluid Volume: Goal: Hemodynamic stability will improve Outcome: Not Progressing   Problem: Clinical Measurements: Goal: Diagnostic test results will improve Outcome: Progressing

## 2022-06-26 NOTE — Progress Notes (Addendum)
Pharmacy Antibiotic Note  Steve [name]. is a 81 y.o. male admitted on 06/25/2022 with sepsis.  Pharmacy has been consulted for Cefepime and Vancomycin dosing.  Crcl improves slightly to >30 ml/min. We will adjust his cefepime dosing. Messaged MD about changing vanc to linezolid due to his hx of CKD.  Ok to stop vanc per Dr. Lonny Prude Plan: Change Cefepime 2g IV q12h Continue Metronidazole 500mg  IV q12h per MD   Height: 5\' 9"  (175.3 cm) Weight: 87.8 kg (193 lb 9 oz) IBW/kg (Calculated) : 70.7  Temp (24hrs), Avg:99.4 F (37.4 C), Min:98.3 F (36.8 C), Max:101.6 F (38.7 C)  Recent Labs  Lab 06/25/22 1550 06/25/22 1640 06/25/22 2038 06/26/22 0513  WBC 9.6  --   --  6.9  CREATININE 2.47*  --   --  1.91*  LATICACIDVEN  --  2.6* 1.2  --      Estimated Creatinine Clearance: 32.1 mL/min (A) (by C-G formula based on SCr of 1.91 mg/dL (H)).    Allergies  Allergen Reactions   Januvia [Sitagliptin] Other (See Comments)    Soreness to stomach area. Intolerance    Statins Other (See Comments)    Myalgias, muscle weakness, swelling, pain    Antimicrobials this admission: Cefepime 9/1 >>  Metronidazole 9/1 >>  Vancomycin 9/1 >> 9/2  Dose adjustments this admission: N/A  Microbiology results: 9/1 BCx: pending 9/1 UCx: pending   Onnie Boer, PharmD, BCIDP, AAHIVP, CPP Infectious Disease Pharmacist 06/26/2022 10:37 AM

## 2022-06-26 NOTE — Progress Notes (Signed)
Dr. Marlowe Sax paged x2, charge nurse Roderick Pee also made aware. Rapid response nurse, Mindy, RN made aware. This nurse instructed to call back in 15 minutes if not contacted by on call provider.

## 2022-06-26 NOTE — Evaluation (Signed)
Physical Therapy Evaluation Patient Details Name: Steve [name]. MRN: [id number] DOB: 1941/07/29 Today's Date: 06/26/2022  History of Present Illness  81 y.o. male presenting 06/25/22 with tremors. He reports that he felt bad today.  Some tremors; AKI, hyponatremia, sepsis of ?origin   PMH significant of CAD s/p CABG; HTN; HLD; DM; PAD; and afib  Clinical Impression   Pt admitted secondary to problem above with deficits below. PTA patient was living alone in independent living. Reports he does not use a device inside apartment and uses either rollator or RW when outside apartment.  Pt currently requires minguard assist for safety due to very narrow walking space in his hospital room and pt with bil UE IVs.  Anticipate patient will benefit from PT to address problems listed below.Will continue to follow acutely to maximize functional mobility independence and safety.          Recommendations for follow up therapy are one component of a multi-disciplinary discharge planning process, led by the attending physician.  Recommendations may be updated based on patient status, additional functional criteria and insurance authorization.  Follow Up Recommendations Home health PT      Assistance Recommended at Discharge PRN  Patient can return home with the following  Assistance with cooking/housework;Assist for transportation;Help with stairs or ramp for entrance    Equipment Recommendations None recommended by PT  Recommendations for Other Services       Functional Status Assessment Patient has had a recent decline in their functional status and demonstrates the ability to make significant improvements in function in a reasonable and predictable amount of time.     Precautions / Restrictions Precautions Precautions: Fall      Mobility  Bed Mobility Overal bed mobility: Modified Independent             General bed mobility comments: incr time, HOB elevated 30    Transfers Overall  transfer level: Needs assistance Equipment used: Rolling walker (2 wheels) Transfers: Sit to/from Stand Sit to Stand: Min guard           General transfer comment: for safety, no assist needed    Ambulation/Gait Ambulation/Gait assistance: Min guard Gait Distance (Feet): 15 Feet Assistive device: Rolling walker (2 wheels) Gait Pattern/deviations: Step-through pattern, Decreased stride length, Wide base of support       General Gait Details: small steps primarily due to room layout; pt negotiated around obstacles with RW well  Stairs            Wheelchair Mobility    Modified Rankin (Stroke Patients Only)       Balance Overall balance assessment: Needs assistance Sitting-balance support: No upper extremity supported, Feet supported Sitting balance-Leahy Scale: Good     Standing balance support: Bilateral upper extremity supported, Reliant on assistive device for balance Standing balance-Leahy Scale: Poor                               Pertinent Vitals/Pain Pain Assessment Pain Assessment: No/denies pain    Home Living Family/patient expects to be discharged to:: Private residence (independent living) Living Arrangements: Alone   Type of Home: Independent living facility Home Access: Level entry       Home Layout: One level Home Equipment: Conservation officer, nature (2 wheels);Rollator (4 wheels) Additional Comments: Patient uses rollator to walk to dining room; does not use device in apartment; uses RW when goes out to appointments    Prior Function  Prior Level of Function : Independent/Modified Independent             Mobility Comments: Patient uses rollator to walk to dining room; does not use device in apartment; uses RW when goes out to appointments ADLs Comments: reports independent with BADLs     Hand Dominance   Dominant Hand: Right    Extremity/Trunk Assessment   Upper Extremity Assessment Upper Extremity Assessment: Defer to OT  evaluation    Lower Extremity Assessment Lower Extremity Assessment: Generalized weakness    Cervical / Trunk Assessment Cervical / Trunk Assessment: Kyphotic  Communication   Communication: No difficulties  Cognition Arousal/Alertness: Awake/alert Behavior During Therapy: WFL for tasks assessed/performed Overall Cognitive Status: Within Functional Limits for tasks assessed                                          General Comments General comments (skin integrity, edema, etc.): assisted to [facility] prior to ambulation; room very small with lots of equipment making gait challenging    Exercises     Assessment/Plan    PT Assessment Patient needs continued PT services  PT Problem List Decreased strength;Decreased balance;Decreased mobility;Decreased knowledge of use of DME       PT Treatment Interventions DME instruction;Gait training;Functional mobility training;Therapeutic activities;Therapeutic exercise;Balance training;Patient/family education    PT Goals (Current goals can be found in the Care Plan section)  Acute Rehab PT Goals Patient Stated Goal: return to [facility] living PT Goal Formulation: With patient Time For Goal Achievement: 07/10/22 Potential to Achieve Goals: Good    Frequency Min 3X/week     Co-evaluation               AM-PAC PT "6 Clicks" Mobility  Outcome Measure Help needed turning from your back to your side while in a flat bed without using bedrails?: None Help needed moving from lying on your back to sitting on the side of a flat bed without using bedrails?: None Help needed moving to and from a bed to a chair (including a wheelchair)?: A Little Help needed standing up from a chair using your arms (e.g., wheelchair or bedside chair)?: A Little Help needed to walk in hospital room?: A Little Help needed climbing 3-5 steps with a railing? : A Lot 6 Click Score: 19    End of Session   Activity Tolerance: Patient  tolerated treatment well Patient left: in bed;with call bell/phone within reach;with bed alarm set Nurse Communication: Mobility status PT Visit Diagnosis: Difficulty in walking, not elsewhere classified (R26.2)    Time: 1410-3013 PT Time Calculation (min) (ACUTE ONLY): 14 min   Charges:   PT Evaluation $PT Eval Low Complexity: [place], PT Acute Rehabilitation Services  Office [phone]   Rexanne Mano 06/26/2022, 10:11 AM

## 2022-06-26 NOTE — Evaluation (Signed)
Occupational Therapy Evaluation Patient Details Name: Steve [name]. MRN: [id number] DOB: 06/16/1941 Today's Date: 06/26/2022   History of Present Illness 81 y.o. male presenting 06/25/22 with tremors. He reports that he felt bad today.  Some tremors; AKI, hyponatremia, sepsis of ?origin   PMH significant of CAD s/p CABG; HTN; HLD; DM; PAD; and afib   Clinical Impression   Pt admitted for concerns listed above. PTA pt reported that he was independent with all ADL's and functional mobility, recently having to rely more on his walkers. At this time, pt presents with mild weakness and balance deficits. He requires min guard for functional mobility and up to min A for LB ADL's in standing. Recommending HHOT to maximize independence with ADL's and safety. OT will follow acutely.       Recommendations for follow up therapy are one component of a multi-disciplinary discharge planning process, led by the attending physician.  Recommendations may be updated based on patient status, additional functional criteria and insurance authorization.   Follow Up Recommendations  Home health OT    Assistance Recommended at Discharge Set up Supervision/Assistance  Patient can return home with the following A little help with walking and/or transfers;A little help with bathing/dressing/bathroom;Direct supervision/assist for medications management;Direct supervision/assist for financial management;Help with stairs or ramp for entrance    Functional Status Assessment  Patient has had a recent decline in their functional status and demonstrates the ability to make significant improvements in function in a reasonable and predictable amount of time.  Equipment Recommendations  None recommended by OT    Recommendations for Other Services       Precautions / Restrictions Precautions Precautions: Fall Restrictions Weight Bearing Restrictions: No      Mobility Bed Mobility Overal bed mobility: Modified  Independent             General bed mobility comments: incr time, HOB elevated 30    Transfers Overall transfer level: Needs assistance Equipment used: Rolling walker (2 wheels) Transfers: Sit to/from Stand Sit to Stand: Min guard           General transfer comment: for safety, no assist needed      Balance Overall balance assessment: Needs assistance Sitting-balance support: No upper extremity supported, Feet supported Sitting balance-Leahy Scale: Good     Standing balance support: Bilateral upper extremity supported, Reliant on assistive device for balance Standing balance-Leahy Scale: Poor                             ADL either performed or assessed with clinical judgement   ADL Overall ADL's : Needs assistance/impaired Eating/Feeding: Modified independent   Grooming: Supervision/safety;Standing   Upper Body Bathing: Supervision/ safety;Sitting   Lower Body Bathing: Minimal assistance;Sitting/lateral leans;Sit to/from stand   Upper Body Dressing : Modified independent;Sitting   Lower Body Dressing: Minimal assistance;Sitting/lateral leans;Sit to/from stand   Toilet Transfer: Min guard;Ambulation   Toileting- Clothing Manipulation and Hygiene: Minimal assistance;Sitting/lateral lean;Sit to/from stand       Functional mobility during ADLs: Min guard;Rolling walker (2 wheels) General ADL Comments: Limited due to balance and weakness     Vision Baseline Vision/History: 1 Wears glasses Ability to See in Adequate Light: 0 Adequate Patient Visual Report: No change from baseline Vision Assessment?: No apparent visual deficits     Perception     Praxis      Pertinent Vitals/Pain Pain Assessment Pain Assessment: No/denies pain     Hand  Dominance Right   Extremity/Trunk Assessment Upper Extremity Assessment Upper Extremity Assessment: Generalized weakness   Lower Extremity Assessment Lower Extremity Assessment: Generalized weakness    Cervical / Trunk Assessment Cervical / Trunk Assessment: Kyphotic   Communication Communication Communication: No difficulties   Cognition Arousal/Alertness: Awake/alert Behavior During Therapy: WFL for tasks assessed/performed Overall Cognitive Status: Within Functional Limits for tasks assessed                                       General Comments  VSS on RA    Exercises     Shoulder Instructions      Home Living Family/patient expects to be discharged to:: Private residence (independent living) Living Arrangements: Alone Available Help at Discharge: Personal care attendant Type of Home: Independent living facility Home Access: Level entry     Home Layout: One level     Bathroom Shower/Tub: Teacher, early years/pre: Handicapped height Bathroom Accessibility: Yes How Accessible: Accessible via walker Home Equipment: Conservation officer, nature (2 wheels);Rollator (4 wheels)   Additional Comments: Patient uses rollator to walk to dining room; does not use device in apartment; uses RW when goes out to appointments      Prior Functioning/Environment Prior Level of Function : Independent/Modified Independent             Mobility Comments: Patient uses rollator to walk to dining room; does not use device in apartment; uses RW when goes out to appointments ADLs Comments: reports independent with BADLs        OT Problem List: Decreased strength;Decreased activity tolerance;Impaired balance (sitting and/or standing);Decreased safety awareness;Impaired sensation      OT Treatment/Interventions: Self-care/ADL training;Therapeutic exercise;Energy conservation;DME and/or AE instruction;Therapeutic activities;Patient/family education;Balance training    OT Goals(Current goals can be found in the care plan section) Acute Rehab OT Goals Patient Stated Goal: To go home OT Goal Formulation: With patient Time For Goal Achievement: 07/10/22 Potential to  Achieve Goals: Good ADL Goals Pt Will Perform Grooming: with modified independence;standing Pt Will Perform Lower Body Bathing: with modified independence;sitting/lateral leans;sit to/from stand Pt Will Perform Lower Body Dressing: with modified independence;sitting/lateral leans;sit to/from stand Pt Will Transfer to Toilet: with modified independence;ambulating Pt Will Perform Toileting - Clothing Manipulation and hygiene: with modified independence;sitting/lateral leans;sit to/from stand  OT Frequency: Min 2X/week    Co-evaluation              AM-PAC OT "6 Clicks" Daily Activity     Outcome Measure Help from another person eating meals?: None Help from another person taking care of personal grooming?: A Little Help from another person toileting, which includes using toliet, bedpan, or urinal?: A Little Help from another person bathing (including washing, rinsing, drying)?: A Little Help from another person to put on and taking off regular upper body clothing?: None Help from another person to put on and taking off regular lower body clothing?: A Little 6 Click Score: 20   End of Session Equipment Utilized During Treatment: Rolling walker (2 wheels) Nurse Communication: Mobility status  Activity Tolerance: Patient tolerated treatment well Patient left: in bed;with call bell/phone within reach  OT Visit Diagnosis: Unsteadiness on feet (R26.81);Other abnormalities of gait and mobility (R26.89);Muscle weakness (generalized) (M62.81)                Time: 9509-3267 OT Time Calculation (min): 15 min Charges:  OT General Charges $OT Visit: 1 Visit  OT Evaluation $OT Eval Moderate Complexity: 1 Mod  Natthew Marlatt, OTR/L [facility], Acute Rehab  Lachele Lievanos Elane Yolanda Bonine 06/26/2022, 12:34 PM

## 2022-06-26 NOTE — Progress Notes (Addendum)
PROGRESS NOTE    Steve [name].  YQI:[id number] DOB: 1941/08/31 DOA: 06/25/2022 PCP: System, Provider Not In   Brief Narrative: Steve [name]. is a 81 y.o. male with a history of CAD s/p CABG, hypertension, hyperlipidemia, diabetes mellitus type 2, PAD, atrial fibrillation. Patient presented secondary to tremors and was found to have fevers with concern for possible infection. SIRS criteria met without source identified. Patient started empirically on Vancomycin, Cefepime and Flagyl. Blood and urine cultures obtained.   Assessment and Plan:  Sepsis Present on admission. Source is possibly cellulitis. Chronic ulcer which could be source for osteomyelitis. Blood and urine cultures obtained. Empiric Vancomycin, Cefepime and Flagyl initiated on admission. -Discontinue Vancomycin -Continue Cefepime and Flagyl -Follow-up culture data  AKI on CKD stage IIIa Patient's baseline creatinine appears to be around 1.2. Creatinine of 2.47 on admission. Improved to 1.91 this morning on IV fluids -Continue IV fluids -BMP in AM  Hyponatremia Sodium of 126 on admission. Likely related to dehydration. Patient started on IV fluids. Sodium up to 128 today. -BMP in AM  Foot ulcer Cellulitis Patient with a chronic ulcer. Complicated by diabetes. -Foot x-ray  Acute urinary retention Unsure of etiology. Patient appears to be asymptomatic. Urinalysis is not suggestive of infection.  -Serial bladder scans/in and out as needed  Constipation -Dulcolax suppository. If ineffective, SMOG enema  Diabetes mellitus type 2 Patient is on Januvia, Levemir and metformin as an outpatient. Most recent hemoglobin A1C is 7.9%. -Continue Levemir and SSI  CAD Hyperlipidemia PAD No chest pain. Stable. -Continue Zetia  Primary hypertension -Continue Imdur and Toprol XL  Atrial fibrillation -Continue Eliquis and Toprol XL   DVT prophylaxis: Eliquis Code Status:   Code Status: Full Code Family  Communication: None at bedside Disposition Plan: Discharge home pending transition to oral antibiotics as needed, continued infectious workup, improvement of urinary retention   Consultants:  None  Procedures:  None  Antimicrobials: Vancomycin Cefepime Flagyl    Subjective: Patient reports needing to have a bowel movement.  Objective: BP (!) 142/54   Pulse 70   Temp 98.8 F (37.1 C)   Resp 18   Ht 5' 9"  (1.753 m)   Wt 87.8 kg   SpO2 96%   BMI 28.58 kg/m   Examination:  General exam: Appears calm and comfortable Respiratory system: Clear to auscultation. Respiratory effort normal. Cardiovascular system: S1 & S2 heard. Gastrointestinal system: Abdomen is distended, soft and nontender. No organomegaly or masses felt. Normal bowel sounds heard. Central nervous system: Alert and oriented. No focal neurological deficits. Musculoskeletal: No edema. No calf tenderness Skin: Left foot with deep ulcer located on later plantar surface. Psychiatry: Judgement and insight appear normal. Mood & affect appropriate.    Data Reviewed: I have personally reviewed following labs and imaging studies  CBC Lab Results  Component Value Date   WBC 6.9 06/26/2022   RBC 2.69 (L) 06/26/2022   HGB 8.6 (L) 06/26/2022   HCT 23.3 (L) 06/26/2022   MCV 86.6 06/26/2022   MCH 32.0 06/26/2022   PLT 103 (L) 06/26/2022   MCHC 36.9 (H) 06/26/2022   RDW 14.3 06/26/2022   LYMPHSABS 0.3 (L) 06/25/2022   MONOABS 0.2 06/25/2022   EOSABS 0.1 06/25/2022   BASOSABS 0.0 38/75/6433     Last metabolic panel Lab Results  Component Value Date   NA 128 (L) 06/26/2022   K 4.0 06/26/2022   CL 103 06/26/2022   CO2 14 (L) 06/26/2022   BUN 23 06/26/2022  CREATININE 1.91 (H) 06/26/2022   GLUCOSE 177 (H) 06/26/2022   GFRNONAA 34 (L) 06/26/2022   GFRAA 46 (L) 05/19/2020   CALCIUM 8.0 (L) 06/26/2022   PHOS 2.8 06/25/2022   PROT 6.7 06/25/2022   ALBUMIN 3.3 (L) 06/25/2022   BILITOT 0.6 06/25/2022    ALKPHOS 50 06/25/2022   AST 24 06/25/2022   ALT 15 06/25/2022   ANIONGAP 11 06/26/2022    GFR: Estimated Creatinine Clearance: 32.1 mL/min (A) (by C-G formula based on SCr of 1.91 mg/dL (H)).  Recent Results (from the past 240 hour(s))  SARS Coronavirus 2 by RT PCR (hospital order, performed in [facility] hospital lab) *cepheid single result test* Anterior Nasal Swab     Status: None   Collection Time: 06/25/22  4:37 PM   Specimen: Anterior Nasal Swab  Result Value Ref Range Status   SARS Coronavirus 2 by RT PCR NEGATIVE NEGATIVE Final    Comment: (NOTE) SARS-CoV-2 target nucleic acids are NOT DETECTED.  The SARS-CoV-2 RNA is generally detectable in upper and lower respiratory specimens during the acute phase of infection. The lowest concentration of SARS-CoV-2 viral copies this assay can detect is 250 copies / mL. A negative result does not preclude SARS-CoV-2 infection and should not be used as the sole basis for treatment or other patient management decisions.  A negative result may occur with improper specimen collection / handling, submission of specimen other than nasopharyngeal swab, presence of viral mutation(s) within the areas targeted by this assay, and inadequate number of viral copies (<250 copies / mL). A negative result must be combined with clinical observations, patient history, and epidemiological information.  Fact Sheet for Patients:   https://www.patel.info/  Fact Sheet for Healthcare Providers: https://hall.com/  This test is not yet approved or  cleared by the [place] FDA and has been authorized for detection and/or diagnosis of SARS-CoV-2 by FDA under an Emergency Use Authorization (EUA).  This EUA will remain in effect (meaning this test can be used) for the duration of the COVID-19 declaration under Section 564(b)(1) of the Act, 21 U.S.C. section 360bbb-3(b)(1), unless the authorization is terminated  or revoked sooner.  Performed at [facility], [place] [address] [place], [place] [address]   Culture, blood (x 2)     Status: None (Preliminary result)   Collection Time: 06/25/22  8:37 PM   Specimen: BLOOD  Result Value Ref Range Status   Specimen Description BLOOD RIGHT ANTECUBITAL  Final   Special Requests   Final    BOTTLES DRAWN AEROBIC AND ANAEROBIC Blood Culture adequate volume   Culture   Final    NO GROWTH < 12 HOURS Performed at [facility], [place] [address], [place], [place] [address]    Report Status PENDING  Incomplete  Culture, blood (x 2)     Status: None (Preliminary result)   Collection Time: 06/25/22  8:38 PM   Specimen: BLOOD  Result Value Ref Range Status   Specimen Description BLOOD RIGHT ANTECUBITAL  Final   Special Requests   Final    BOTTLES DRAWN AEROBIC AND ANAEROBIC Blood Culture adequate volume   Culture   Final    NO GROWTH < 12 HOURS Performed at [facility], [place] [address], [place], [place] [address]    Report Status PENDING  Incomplete      Radiology Studies: DG Foot Complete Left  Result Date: 06/26/2022 CLINICAL DATA:  Diabetic foot ulcer. EXAM: LEFT FOOT - COMPLETE 3+ VIEW  COMPARISON:  None Available. FINDINGS: No fracture. No bone lesion. There is no bone resorption to suggest osteomyelitis. Joints are normally aligned. Soft tissue air projects along the lateral plantar aspect of the forefoot adjacent to the fifth metatarsal head consistent with a diabetic ulcer. IMPRESSION: 1. No fracture.  No evidence of osteomyelitis. Electronically Signed   By: Lajean Manes M.D.   On: 06/26/2022 12:41      LOS: 1 day    Cordelia Poche, MD [facility] 06/26/2022, 3:18 PM   If 7PM-7AM, please contact night-coverage [email or web address]

## 2022-06-27 ENCOUNTER — Inpatient Hospital Stay (HOSPITAL_COMMUNITY): Payer: Medicare Other

## 2022-06-27 DIAGNOSIS — A419 Sepsis, unspecified organism: Secondary | ICD-10-CM | POA: Diagnosis not present

## 2022-06-27 DIAGNOSIS — N179 Acute kidney failure, unspecified: Secondary | ICD-10-CM | POA: Diagnosis not present

## 2022-06-27 DIAGNOSIS — E871 Hypo-osmolality and hyponatremia: Secondary | ICD-10-CM | POA: Diagnosis not present

## 2022-06-27 DIAGNOSIS — E860 Dehydration: Secondary | ICD-10-CM | POA: Diagnosis not present

## 2022-06-27 LAB — BASIC METABOLIC PANEL
Anion gap: 10 (ref 5–15)
BUN: 23 mg/dL (ref 8–23)
CO2: 15 mmol/L — ABNORMAL LOW (ref 22–32)
Calcium: 8.1 mg/dL — ABNORMAL LOW (ref 8.9–10.3)
Chloride: 105 mmol/L (ref 98–111)
Creatinine, Ser: 1.69 mg/dL — ABNORMAL HIGH (ref 0.61–1.24)
GFR, Estimated: 40 mL/min — ABNORMAL LOW (ref >60)
Glucose, Bld: 110 mg/dL — ABNORMAL HIGH (ref 70–99)
Potassium: 3.6 mmol/L (ref 3.5–5.1)
Sodium: 130 mmol/L — ABNORMAL LOW (ref 135–145)

## 2022-06-27 LAB — GLUCOSE, CAPILLARY
Glucose-Capillary: 117 mg/dL — ABNORMAL HIGH (ref 70–99)
Glucose-Capillary: 114 mg/dL — ABNORMAL HIGH (ref 70–99)
Glucose-Capillary: 136 mg/dL — ABNORMAL HIGH (ref 70–99)
Glucose-Capillary: 104 mg/dL — ABNORMAL HIGH (ref 70–99)

## 2022-06-27 LAB — URINE CULTURE: Culture: NO GROWTH

## 2022-06-27 LAB — CBC
HCT: 23.9 % — ABNORMAL LOW (ref 39.0–52.0)
Hemoglobin: 8.6 g/dL — ABNORMAL LOW (ref 13.0–17.0)
MCH: 31.2 pg (ref 26.0–34.0)
MCHC: 36 g/dL (ref 30.0–36.0)
MCV: 86.6 fL (ref 80.0–100.0)
Platelets: 101 10*3/uL — ABNORMAL LOW (ref 150–400)
RBC: 2.76 MIL/uL — ABNORMAL LOW (ref 4.22–5.81)
RDW: 14.3 % (ref 11.5–15.5)
WBC: 5 10*3/uL (ref 4.0–10.5)
nRBC: 0 % (ref 0.0–0.2)

## 2022-06-27 LAB — PROCALCITONIN: Procalcitonin: 1.65 ng/mL

## 2022-06-27 MED ORDER — LIDOCAINE HCL URETHRAL/MUCOSAL 2 % EX GEL
1.0000 | Freq: Once | CUTANEOUS | Status: DC | PRN
Start: 2022-06-27 — End: 2022-07-13
  Filled 2022-06-27: qty 6

## 2022-06-27 MED ORDER — ADULT MULTIVITAMIN W/MINERALS CH
1.0000 | ORAL_TABLET | Freq: Every day | ORAL | Status: DC
  Administered 2022-06-27 – 2022-07-13 (×15): 1 via ORAL
  Filled 2022-06-27 (×15): qty 1

## 2022-06-27 MED ORDER — ENSURE ENLIVE PO LIQD
237.0000 mL | Freq: Three times a day (TID) | ORAL | Status: DC
  Administered 2022-06-27 – 2022-07-12 (×22): 237 mL via ORAL

## 2022-06-27 NOTE — Progress Notes (Signed)
Initial Nutrition Assessment  DOCUMENTATION CODES:   Not applicable  INTERVENTION:   -Feeding assistance with meals -MVI with minerals daily -Ensure Enlive po TID, each supplement provides 350 kcal and 20 grams of protein -Liberalize diet to regular for widest variety of meal selections  NUTRITION DIAGNOSIS:   Increased nutrient needs related to acute illness as evidenced by estimated needs.  GOAL:   Patient will meet greater than or equal to 90% of their needs  MONITOR:   PO intake, Supplement acceptance  REASON FOR ASSESSMENT:   Consult Assessment of nutrition requirement/status  ASSESSMENT:   Pt with medical history significant of CAD s/p CABG; HTN; HLD; DM; PAD; and afib presenting with tremors. He reports that he felt bad today.  Pt admitted with sepsis due to unknown etiology and AKI secondary to dehydration.    Reviewed I/O's: +1.6 L x 24 hours and -341 ml since admission  UOP: 1 L x 24 hours  Pt unavailable at time of visit. Attempted to speak with pt via call to hospital room phone, however, unable to reach. RD unable to obtain further nutrition-related history or complete nutrition-focused physical exam at this time.    Pt currently on a heart healthy, carb modified diet. Noted meal completions 0%. Noted pt with essential tremors.   Reviewed wt hx; pt has experienced a 2.8% wt loss over the past 6 months, which is not significant for time frame. Noted pt has moderate edema, which may be masking further weight loss as well as fat and muscle depletions.   Medications reviewed and include colace and lactated ringers infusion @ 75 ml/hr.   Lab Results  Component Value Date   HGBA1C 7.9 (H) 06/25/2022   PTA DM medications are 50 mg januvia daily, 1000 mg metformin daily, and 25 units insulin detemir daily.   Labs reviewed: CBGS: 117-134 (inpatient orders for glycemic control are 0-15 units insulin aspart TID, 0-5 units insulin aspart daily at bedtime, and 25  units insulin detemir daily).    Diet Order:   Diet Order             Diet heart healthy/carb modified Room service appropriate? Yes; Fluid consistency: Thin  Diet effective now                   EDUCATION NEEDS:   No education needs have been identified at this time  Skin:  Skin Assessment: Reviewed RN Assessment  Last BM:  06/26/22 (type 2)  Height:   Ht Readings from Last 1 Encounters:  06/25/22 5\' 9"  (1.753 m)    Weight:   Wt Readings from Last 1 Encounters:  06/26/22 87.8 kg    Ideal Body Weight:  72.7 kg  BMI:  Body mass index is 28.58 kg/m.  Estimated Nutritional Needs:   Kcal:  1950-2150  Protein:  105-120 grams  Fluid:  > 1.9 L    Loistine Chance, RD, LDN, [place] Registered Dietitian II Certified Diabetes Care and Education Specialist Please refer to [facility] for RD and/or RD on-call/weekend/after hours pager

## 2022-06-27 NOTE — Progress Notes (Addendum)
PROGRESS NOTE    Steve [name].  LYH:[id number] DOB: 05-13-1941 DOA: 06/25/2022 PCP: System, Provider Not In   Brief Narrative: Steve [name]. is a 81 y.o. male with a history of CAD s/p CABG, hypertension, hyperlipidemia, diabetes mellitus type 2, PAD, atrial fibrillation. Patient presented secondary to tremors and was found to have fevers with concern for possible infection. SIRS criteria met without source identified. Patient started empirically on Vancomycin, Cefepime and Flagyl. Blood and urine cultures obtained.   Assessment and Plan:  Sepsis Present on admission. Source is possibly cellulitis. Chronic ulcer which could be source for osteomyelitis. Blood and urine cultures obtained. Empiric Vancomycin, Cefepime and Flagyl initiated on admission. -Discontinue Vancomycin -Continue Cefepime and Flagyl; likely transition to Cefadroxil in AM -Follow-up culture data  AKI on CKD stage IIIa Patient's baseline creatinine appears to be around 1.2. Creatinine of 2.47 on admission. Improved to 1.69 this morning on IV fluids -Continue IV fluids -BMP in AM  Hyponatremia Sodium of 126 on admission. Likely related to dehydration. Patient started on IV fluids. Sodium up to 130 today. -BMP in AM  Foot ulcer Cellulitis Patient with a chronic ulcer. Complicated by diabetes. Foot x-ray without evidence of osteomyelitis. Cellulitis significantly improved with antibiotics. -MRI left foot -Continue antibiotics as mentioned above pending MRI results  Acute urinary retention Unsure of etiology. Patient appears to be asymptomatic. Urinalysis is not suggestive of infection. Patient with continued urinary retention -Foley catheter -Outpatient urology follow-up  Constipation Some improvement with bowel regimen.  Diabetes mellitus type 2 Patient is on Januvia, Levemir and metformin as an outpatient. Most recent hemoglobin A1C is 7.9%. -Continue Levemir and SSI  CAD Hyperlipidemia PAD No  chest pain. Stable. -Continue Zetia  Primary hypertension -Continue Imdur and Toprol XL  Atrial fibrillation -Continue Eliquis and Toprol XL   DVT prophylaxis: Eliquis Code Status:   Code Status: Full Code Family Communication: None at bedside Disposition Plan: Discharge home vs SNF pending transition to oral antibiotics as needed, continued infectious workup, improvement of urinary retention   Consultants:  None  Procedures:  None  Antimicrobials: Vancomycin Cefepime Flagyl    Subjective: No issues this morning. Patient states he had a big bowel movement and good amount of urination, however per nursing patient has only voided minimally and continues to show evidence of retention on serial bladder scans.  Objective: BP (!) 148/62 (BP Location: Right Arm)   Pulse 73   Temp 98.1 F (36.7 C) (Oral)   Resp 18   Ht 5' 9"  (1.753 m)   Wt 87.8 kg   SpO2 95%   BMI 28.58 kg/m   Examination:  General exam: Appears calm and comfortable  Respiratory system: Clear to auscultation. Respiratory effort normal. Cardiovascular system: S1 & S2 heard, RRR. No murmurs, rubs, gallops or clicks. Gastrointestinal system: Abdomen is slightly distended, soft and nontender. No organomegaly or masses felt. Normal bowel sounds heard. Central nervous system: Alert. Musculoskeletal: No edema. No calf tenderness Skin: Left foot with some mild erythema on left dorsal aspect of foot   Data Reviewed: I have personally reviewed following labs and imaging studies  CBC Lab Results  Component Value Date   WBC 5.0 06/27/2022   RBC 2.76 (L) 06/27/2022   HGB 8.6 (L) 06/27/2022   HCT 23.9 (L) 06/27/2022   MCV 86.6 06/27/2022   MCH 31.2 06/27/2022   PLT 101 (L) 06/27/2022   MCHC 36.0 06/27/2022   RDW 14.3 06/27/2022   LYMPHSABS 0.3 (L) 06/25/2022  MONOABS 0.2 06/25/2022   EOSABS 0.1 06/25/2022   BASOSABS 0.0 33/83/2919     Last metabolic panel Lab Results  Component Value Date   NA  130 (L) 06/27/2022   K 3.6 06/27/2022   CL 105 06/27/2022   CO2 15 (L) 06/27/2022   BUN 23 06/27/2022   CREATININE 1.69 (H) 06/27/2022   GLUCOSE 110 (H) 06/27/2022   GFRNONAA 40 (L) 06/27/2022   GFRAA 46 (L) 05/19/2020   CALCIUM 8.1 (L) 06/27/2022   PHOS 2.8 06/25/2022   PROT 6.7 06/25/2022   ALBUMIN 3.3 (L) 06/25/2022   BILITOT 0.6 06/25/2022   ALKPHOS 50 06/25/2022   AST 24 06/25/2022   ALT 15 06/25/2022   ANIONGAP 10 06/27/2022    GFR: Estimated Creatinine Clearance: 36.3 mL/min (A) (by C-G formula based on SCr of 1.69 mg/dL (H)).  Recent Results (from the past 240 hour(s))  SARS Coronavirus 2 by RT PCR (hospital order, performed in [facility] hospital lab) *cepheid single result test* Anterior Nasal Swab     Status: None   Collection Time: 06/25/22  4:37 PM   Specimen: Anterior Nasal Swab  Result Value Ref Range Status   SARS Coronavirus 2 by RT PCR NEGATIVE NEGATIVE Final    Comment: (NOTE) SARS-CoV-2 target nucleic acids are NOT DETECTED.  The SARS-CoV-2 RNA is generally detectable in upper and lower respiratory specimens during the acute phase of infection. The lowest concentration of SARS-CoV-2 viral copies this assay can detect is 250 copies / mL. A negative result does not preclude SARS-CoV-2 infection and should not be used as the sole basis for treatment or other patient management decisions.  A negative result may occur with improper specimen collection / handling, submission of specimen other than nasopharyngeal swab, presence of viral mutation(s) within the areas targeted by this assay, and inadequate number of viral copies (<250 copies / mL). A negative result must be combined with clinical observations, patient history, and epidemiological information.  Fact Sheet for Patients:   https://www.patel.info/  Fact Sheet for Healthcare Providers: https://hall.com/  This test is not yet approved or  cleared by the  [place] FDA and has been authorized for detection and/or diagnosis of SARS-CoV-2 by FDA under an Emergency Use Authorization (EUA).  This EUA will remain in effect (meaning this test can be used) for the duration of the COVID-19 declaration under Section 564(b)(1) of the Act, 21 U.S.C. section 360bbb-3(b)(1), unless the authorization is terminated or revoked sooner.  Performed at [facility], [place] [address], [place], [place] [address]   Culture, blood (x 2)     Status: None (Preliminary result)   Collection Time: 06/25/22  8:37 PM   Specimen: BLOOD  Result Value Ref Range Status   Specimen Description BLOOD RIGHT ANTECUBITAL  Final   Special Requests   Final    BOTTLES DRAWN AEROBIC AND ANAEROBIC Blood Culture adequate volume   Culture   Final    NO GROWTH 2 DAYS Performed at [facility], [place] [address] [place],  [address]    Report Status PENDING  Incomplete  Culture, blood (x 2)     Status: None (Preliminary result)   Collection Time: 06/25/22  8:38 PM   Specimen: BLOOD  Result Value Ref Range Status   Specimen Description BLOOD RIGHT ANTECUBITAL  Final   Special Requests   Final    BOTTLES DRAWN AEROBIC AND ANAEROBIC Blood Culture adequate volume   Culture   Final  NO GROWTH 2 DAYS Performed at [facility], [place] [address], [place], [place] [address]    Report Status PENDING  Incomplete  Urine Culture     Status: None   Collection Time: 06/26/22  2:13 AM   Specimen: Urine, Clean Catch  Result Value Ref Range Status   Specimen Description URINE, CLEAN CATCH  Final   Special Requests NONE  Final   Culture   Final    NO GROWTH Performed at [facility], [place] [address] [place],  [address]    Report Status 06/27/2022 FINAL  Final      Radiology Studies: DG Foot Complete Left  Result Date: 06/26/2022 CLINICAL DATA:  Diabetic foot ulcer. EXAM: LEFT FOOT - COMPLETE 3+ VIEW COMPARISON:  None Available. FINDINGS: No  fracture. No bone lesion. There is no bone resorption to suggest osteomyelitis. Joints are normally aligned. Soft tissue air projects along the lateral plantar aspect of the forefoot adjacent to the fifth metatarsal head consistent with a diabetic ulcer. IMPRESSION: 1. No fracture.  No evidence of osteomyelitis. Electronically Signed   By: Lajean Manes M.D.   On: 06/26/2022 12:41      LOS: 2 days    Cordelia Poche, MD [facility] 06/27/2022, 12:01 PM   If 7PM-7AM, please contact night-coverage [email or web address]

## 2022-06-27 NOTE — Consult Note (Signed)
Urology Consult   Reason for consult: urinary retention, difficult foley  History of Present Illness: [name] Alcaide Jr. is a 81 y.o. currently admitted for sepsis and AKI. He was found to be in retention and nursing staff was doing CIC. They attempted to place an indwelling foley and were unable to do so after several attempts.   Steve [name] has a history of BPH. He denies any sort of prostate intervention previously. His outpatient medicine list has doxazosin on it, but he's not sure if he's taking this or not. Steve [name] was taking tamsulosin previously but stopped several years ago.  He denies a history of GU malignancy/trauma/surgery.  Past Medical History:  Diagnosis Date   CAD (coronary artery disease)    a. s/p CABG x 29 Nov 2011 with LIMA to LAD, SVG to OM1 and distal LCX, SVG to ramus intermediate   Essential hypertension    Hyperlipidemia    Intermittent claudication (HCC)    Ischemic cardiomyopathy    a. 03/2015 Echo: EF45-50%, Gr1 DD, mild Steve.   PAF (paroxysmal atrial fibrillation) (HCC)    a. post op atrial fib 11/2011; short course of amiodarone; stopped 01/25/12   Peripheral arterial disease (HCC)    Right bundle branch block    Type II diabetes mellitus (HCC)     Past Surgical History:  Procedure Laterality Date   CARDIAC CATHETERIZATION  01/16/1999   Est. EF of 65% -- Nonobstruction atherosclerotic coronary artery disease -- Normal left ventricular function       CARDIAC ELECTROPHYSIOLOGY STUDY AND ABLATION  05/21/1999   Normal sinus funtion -- Mildly prolonged interatrial conduction times -- Normal A-V node funtion -- Normal His Purkinje system function -- fNo accessory pathway -- No inducible ventricular tachycardia in the presence of the or in the absence of isoproterenol with programmed stimulation or with burst pacing -- Steven Cochran Klein, M.D. FACC surgan           CHOLECYSTECTOMY  1999   CORONARY ARTERY BYPASS GRAFT  12/10/2011   Procedure: CORONARY ARTERY BYPASS  GRAFTING (CABG);  Surgeon: Peter [name], MD;  Location: [facility] OR;  Service: Open Heart Surgery;  Laterality: N/A;  Coronary Artery bypass graft on pump times four utilizing left internal mammary artery and left saphenous vein harvested endoscopically   LEFT HEART CATH AND CORS/GRAFTS ANGIOGRAPHY N/A 12/18/2018   Procedure: LEFT HEART CATH AND CORS/GRAFTS ANGIOGRAPHY;  Surgeon: Smith, Henry W, MD;  Location: [facility] INVASIVE CV LAB;  Service: Cardiovascular;  Laterality: N/A;   LEFT HEART CATHETERIZATION WITH CORONARY ANGIOGRAM N/A 12/09/2011   Procedure: LEFT HEART CATHETERIZATION WITH CORONARY ANGIOGRAM;  Surgeon: Peter M Jordan, MD;  Location: [facility] CATH LAB;  Service: Cardiovascular;  Laterality: N/A;   LOWER EXTREMITY ANGIOGRAM Left 07/28/2018   Procedure: Lower Extremity Angiogram;  Surgeon: Dickson, Christopher S, MD;  Location: [facility] INVASIVE CV LAB;  Service: Cardiovascular;  Laterality: Left;    Current Hospital Medications:  Home Meds:  No current facility-administered medications on file prior to encounter.   Current Outpatient Medications on File Prior to Encounter  Medication Sig Dispense Refill   acetaminophen (TYLENOL) 500 MG tablet Take 500 mg by mouth every 6 (six) hours as needed for moderate pain.     Cholecalciferol (VITAMIN D3) 50 MCG (2000 UT) capsule Take 2,000 Units by mouth daily.     doxazosin (CARDURA) 2 MG tablet Take 2 mg by mouth daily.     ELIQUIS 2.5 MG TABS tablet Take 2.5 mg by   mouth 2 (two) times daily.     insulin detemir (LEVEMIR) 100 UNIT/ML injection Inject 0.18 mLs (18 Units total) into the skin daily. (Patient taking differently: Inject 25 Units into the skin daily.) 10 mL 2   isosorbide mononitrate (IMDUR) 30 MG 24 hr tablet TAKE 1 TABLET DAILY (NEED OFFICE VISIT FOR REFILLS - 1ST ATTEMPT) (Patient taking differently: Take 30 mg by mouth daily.) 15 tablet 0   metFORMIN (GLUCOPHAGE) 1000 MG tablet Take 1,000 mg by mouth daily.     metoprolol succinate (TOPROL-XL)  100 MG 24 hr tablet Take 1 tablet (100 mg total) by mouth daily. Take with or immediately following a meal. (Patient taking differently: Take 100 mg by mouth daily with supper. Take with or immediately following a meal.) 30 tablet 3   silver sulfADIAZINE (SILVADENE) 1 % cream Apply 1 application  topically daily. 50 g 0   vitamin B-12 (CYANOCOBALAMIN) 500 MCG tablet Take 500 mcg by mouth daily with breakfast.      blood glucose meter kit and supplies Dispense based on patient and insurance preference. Use up to four times daily as directed. (FOR ICD-10 E10.9, E11.9). 1 each 1   cyclobenzaprine (FLEXERIL) 10 MG tablet Take 1 tablet (10 mg total) by mouth 2 (two) times daily as needed for muscle spasms. (Patient not taking: Reported on 06/25/2022) 20 tablet 0   doxycycline (VIBRA-TABS) 100 MG tablet Take 1 tablet (100 mg total) by mouth 2 (two) times daily. (Patient not taking: Reported on 06/25/2022) 20 tablet 0   ezetimibe (ZETIA) 10 MG tablet Take 1 tablet (10 mg total) by mouth daily. (Patient not taking: Reported on 06/25/2022) 90 tablet 3   gabapentin (NEURONTIN) 100 MG capsule Take 1 capsule (100 mg total) by mouth at bedtime. (Patient not taking: Reported on 06/25/2022) 90 capsule 0   Insulin Syringe-Needle U-100 (INSULIN SYRINGE .3CC/31GX5/16") 31G X 5/16" 0.3 ML MISC Use with insulin 100 each 2   JANUVIA 50 MG tablet Take 50 mg by mouth daily. (Patient not taking: Reported on 01/26/2022)     [DISCONTINUED] amLODipine (NORVASC) 10 MG tablet TAKE 1 TABLET DAILY (Patient not taking: Reported on 05/20/2020) 90 tablet 3   [DISCONTINUED] furosemide (LASIX) 40 MG tablet Take 1 tablet (40 mg total) by mouth daily. For 4 days then stop. 4 tablet 0   [DISCONTINUED] insulin aspart (NOVOLOG) 100 UNIT/ML injection Inject 0-15 Units into the skin 3 (three) times daily with meals. CBG 121 - 150: 2 units;  CBG 151 - 200: 3 units;  CBG 201 - 250: 5 units;  CBG 251 - 300: 8 units; CBG 301 - 350: 11 units; CBG 351 - 400:  15 units;  CBG > 400 : 15 units and notify MD (Patient not taking: Reported on 05/20/2020) 10 mL 2     Scheduled Meds:  apixaban  2.5 mg Oral BID   docusate sodium  100 mg Oral BID   doxazosin  2 mg Oral Daily   ezetimibe  10 mg Oral Daily   feeding supplement  237 mL Oral TID BM   gabapentin  100 mg Oral QHS   insulin aspart  0-15 Units Subcutaneous TID WC   insulin aspart  0-5 Units Subcutaneous QHS   insulin detemir  25 Units Subcutaneous Daily   isosorbide mononitrate  30 mg Oral Daily   metoprolol succinate  100 mg Oral Q supper   multivitamin with minerals  1 tablet Oral Daily   sodium chloride flush  3 mL   Intravenous Q12H   sorbitol, milk of mag, mineral oil, glycerin (SMOG) enema  960 mL Rectal Once   Continuous Infusions:  ceFEPime (MAXIPIME) IV Stopped (06/27/22 1304)   lactated ringers Stopped (06/27/22 1303)   metronidazole Stopped (06/27/22 0910)   PRN Meds:.acetaminophen **OR** acetaminophen, albuterol, bisacodyl, cyclobenzaprine, hydrALAZINE, lidocaine, morphine injection, ondansetron **OR** ondansetron (ZOFRAN) IV, oxyCODONE, polyethylene glycol  Allergies:  Allergies  Allergen Reactions   Januvia [Sitagliptin] Other (See Comments)    Soreness to stomach area. Intolerance    Statins Other (See Comments)    Myalgias, muscle weakness, swelling, pain    Family History  Problem Relation Age of Onset   Stroke Mother 34       Cerebellar hemorrhage    Social History:  reports that he quit smoking about 43 years ago. His smoking use included cigarettes. He has a 30.00 pack-year smoking history. He has quit using smokeless tobacco. He reports current alcohol use of about 4.0 standard drinks of alcohol per week. He reports that he does not use drugs.  ROS: A complete review of systems was performed.  All systems are negative except for pertinent findings as noted.  Physical Exam:  Vital signs in last 24 hours: Temp:  [98.1 F (36.7 C)-98.6 F (37 C)] 98.1 F  (36.7 C) (09/03 0928) Pulse Rate:  [69-73] 73 (09/03 0928) Resp:  [16-18] 18 (09/03 0928) BP: (143-151)/(55-62) 148/62 (09/03 0928) SpO2:  [95 %-100 %] 95 % (09/03 0928) Constitutional:  Alert and oriented, No acute distress Cardiovascular: Regular rate and rhythm Respiratory: Normal respiratory effort, Lungs clear bilaterally GI: Abdomen is soft, nontender, nondistended, no abdominal masses GU: No CVA tenderness; uncircumcised phallus Neurologic: Grossly intact, no focal deficits Psychiatric: Normal mood and affect  Laboratory Data:  Recent Labs    06/25/22 1550 06/26/22 0513 06/27/22 0444  WBC 9.6 6.9 5.0  HGB 10.5* 8.6* 8.6*  HCT 29.8* 23.3* 23.9*  PLT 132* 103* 101*    Recent Labs    06/25/22 1550 06/26/22 0513 06/27/22 0444  NA 126* 128* 130*  K 4.1 4.0 3.6  CL 98 103 105  GLUCOSE 208* 177* 110*  BUN 28* 23 23  CALCIUM 8.5* 8.0* 8.1*  CREATININE 2.47* 1.91* 1.69*     Results for orders placed or performed during the hospital encounter of 06/25/22 (from the past 24 hour(s))  Glucose, capillary     Status: Abnormal   Collection Time: 06/26/22  5:00 PM  Result Value Ref Range   Glucose-Capillary 134 (H) 70 - 99 mg/dL  Glucose, capillary     Status: Abnormal   Collection Time: 06/26/22  8:54 PM  Result Value Ref Range   Glucose-Capillary 123 (H) 70 - 99 mg/dL  Procalcitonin     Status: None   Collection Time: 06/27/22  4:44 AM  Result Value Ref Range   Procalcitonin 1.65 ng/mL  Basic metabolic panel     Status: Abnormal   Collection Time: 06/27/22  4:44 AM  Result Value Ref Range   Sodium 130 (L) 135 - 145 mmol/L   Potassium 3.6 3.5 - 5.1 mmol/L   Chloride 105 98 - 111 mmol/L   CO2 15 (L) 22 - 32 mmol/L   Glucose, Bld 110 (H) 70 - 99 mg/dL   BUN 23 8 - 23 mg/dL   Creatinine, Ser 1.69 (H) 0.61 - 1.24 mg/dL   Calcium 8.1 (L) 8.9 - 10.3 mg/dL   GFR, Estimated 40 (L) >60 mL/min   Anion gap 10 5 -   15  CBC     Status: Abnormal   Collection Time: 06/27/22   4:44 AM  Result Value Ref Range   WBC 5.0 4.0 - 10.5 K/uL   RBC 2.76 (L) 4.22 - 5.81 MIL/uL   Hemoglobin 8.6 (L) 13.0 - 17.0 g/dL   HCT 23.9 (L) 39.0 - 52.0 %   MCV 86.6 80.0 - 100.0 fL   MCH 31.2 26.0 - 34.0 pg   MCHC 36.0 30.0 - 36.0 g/dL   RDW 14.3 11.5 - 15.5 %   Platelets 101 (L) 150 - 400 K/uL   nRBC 0.0 0.0 - 0.2 %  Glucose, capillary     Status: Abnormal   Collection Time: 06/27/22  7:21 AM  Result Value Ref Range   Glucose-Capillary 117 (H) 70 - 99 mg/dL  Glucose, capillary     Status: Abnormal   Collection Time: 06/27/22 11:21 AM  Result Value Ref Range   Glucose-Capillary 114 (H) 70 - 99 mg/dL   Recent Results (from the past 240 hour(s))  SARS Coronavirus 2 by RT PCR (hospital order, performed in [place] hospital lab) *cepheid single result test* Anterior Nasal Swab     Status: None   Collection Time: 06/25/22  4:37 PM   Specimen: Anterior Nasal Swab  Result Value Ref Range Status   SARS Coronavirus 2 by RT PCR NEGATIVE NEGATIVE Final    Comment: (NOTE) SARS-CoV-2 target nucleic acids are NOT DETECTED.  The SARS-CoV-2 RNA is generally detectable in upper and lower respiratory specimens during the acute phase of infection. The lowest concentration of SARS-CoV-2 viral copies this assay can detect is 250 copies / mL. A negative result does not preclude SARS-CoV-2 infection and should not be used as the sole basis for treatment or other patient management decisions.  A negative result may occur with improper specimen collection / handling, submission of specimen other than nasopharyngeal swab, presence of viral mutation(s) within the areas targeted by this assay, and inadequate number of viral copies (<250 copies / mL). A negative result must be combined with clinical observations, patient history, and epidemiological information.  Fact Sheet for Patients:   https://www.patel.info/  Fact Sheet for Healthcare  Providers: https://hall.com/  This test is not yet approved or  cleared by the [place] FDA and has been authorized for detection and/or diagnosis of SARS-CoV-2 by FDA under an Emergency Use Authorization (EUA).  This EUA will remain in effect (meaning this test can be used) for the duration of the COVID-19 declaration under Section 564(b)(1) of the Act, 21 U.S.C. section 360bbb-3(b)(1), unless the authorization is terminated or revoked sooner.  Performed at [facility], [place] [address], [place], [place] [address]   Culture, blood (x 2)     Status: None (Preliminary result)   Collection Time: 06/25/22  8:37 PM   Specimen: BLOOD  Result Value Ref Range Status   Specimen Description BLOOD RIGHT ANTECUBITAL  Final   Special Requests   Final    BOTTLES DRAWN AEROBIC AND ANAEROBIC Blood Culture adequate volume   Culture   Final    NO GROWTH 2 DAYS Performed at [facility], [place] [address], [place], [place] [address]    Report Status PENDING  Incomplete  Culture, blood (x 2)     Status: None (Preliminary result)   Collection Time: 06/25/22  8:38 PM   Specimen: BLOOD  Result Value Ref Range Status   Specimen Description BLOOD RIGHT ANTECUBITAL  Final   Special Requests  Final    BOTTLES DRAWN AEROBIC AND ANAEROBIC Blood Culture adequate volume   Culture   Final    NO GROWTH 2 DAYS Performed at [facility], [address] [place], [place] [address]    Report Status PENDING  Incomplete  Urine Culture     Status: None   Collection Time: 06/26/22  2:13 AM   Specimen: Urine, Clean Catch  Result Value Ref Range Status   Specimen Description URINE, CLEAN CATCH  Final   Special Requests NONE  Final   Culture   Final    NO GROWTH Performed at [facility], [address] [place], [place] [address]    Report Status 06/27/2022 FINAL  Final    Renal Function: Recent Labs    06/25/22 1550 06/26/22 0513 06/27/22 0444   CREATININE 2.47* 1.91* 1.69*   Estimated Creatinine Clearance: 36.3 mL/min (A) (by C-G formula based on SCr of 1.69 mg/dL (H)).  Radiologic Imaging: No relevant imaging   Procedure: The patient was prepped/draped in the usual fashion. I carefully advanced a 16 fr coude catheter into the bladder. There was some resistance around the prostate but ultimately I was able to advance into the bladder with return of clear yellow urine. The balloon was inflated with 10 cc sterile water, and foley connected to the appropriate collection bag  Impression/Recommendation 81 yo M with urinary retention, s/p foley placement -would recommend starting patient on tamsulosin 0.4 mg if no other contraindications exist to doing so -f/u with urology as outpatient for possible voiding trial -please call with any further questions  Luke Machen MD 06/27/2022, 4:52 PM  [facility]  Pager: [phone]   

## 2022-06-28 DIAGNOSIS — E860 Dehydration: Secondary | ICD-10-CM | POA: Diagnosis not present

## 2022-06-28 DIAGNOSIS — E871 Hypo-osmolality and hyponatremia: Secondary | ICD-10-CM | POA: Diagnosis not present

## 2022-06-28 DIAGNOSIS — M86179 Other acute osteomyelitis, unspecified ankle and foot: Secondary | ICD-10-CM

## 2022-06-28 DIAGNOSIS — N179 Acute kidney failure, unspecified: Secondary | ICD-10-CM | POA: Diagnosis not present

## 2022-06-28 DIAGNOSIS — A419 Sepsis, unspecified organism: Secondary | ICD-10-CM | POA: Diagnosis not present

## 2022-06-28 LAB — BASIC METABOLIC PANEL
Anion gap: 9 (ref 5–15)
BUN: 20 mg/dL (ref 8–23)
CO2: 16 mmol/L — ABNORMAL LOW (ref 22–32)
Calcium: 8.4 mg/dL — ABNORMAL LOW (ref 8.9–10.3)
Chloride: 108 mmol/L (ref 98–111)
Creatinine, Ser: 1.51 mg/dL — ABNORMAL HIGH (ref 0.61–1.24)
GFR, Estimated: 46 mL/min — ABNORMAL LOW (ref >60)
Glucose, Bld: 95 mg/dL (ref 70–99)
Potassium: 3.5 mmol/L (ref 3.5–5.1)
Sodium: 133 mmol/L — ABNORMAL LOW (ref 135–145)

## 2022-06-28 LAB — CBC
HCT: 23.1 % — ABNORMAL LOW (ref 39.0–52.0)
Hemoglobin: 8.2 g/dL — ABNORMAL LOW (ref 13.0–17.0)
MCH: 30.8 pg (ref 26.0–34.0)
MCHC: 35.5 g/dL (ref 30.0–36.0)
MCV: 86.8 fL (ref 80.0–100.0)
Platelets: 90 10*3/uL — ABNORMAL LOW (ref 150–400)
RBC: 2.66 MIL/uL — ABNORMAL LOW (ref 4.22–5.81)
RDW: 14.4 % (ref 11.5–15.5)
WBC: 5.3 10*3/uL (ref 4.0–10.5)
nRBC: 0 % (ref 0.0–0.2)

## 2022-06-28 LAB — GLUCOSE, CAPILLARY
Glucose-Capillary: 99 mg/dL (ref 70–99)
Glucose-Capillary: 105 mg/dL — ABNORMAL HIGH (ref 70–99)
Glucose-Capillary: 109 mg/dL — ABNORMAL HIGH (ref 70–99)
Glucose-Capillary: 92 mg/dL (ref 70–99)

## 2022-06-28 NOTE — TOC Initial Note (Signed)
Transition of Care (TOC) - Initial/Assessment Note    Patient Details  Name: Steve [name]. MRN: [id number] Date of Birth: 16-Sep-1941  Transition of Care [facility]) CM/SW Contact:    Tom-Johnson, Renea Ee, RN Phone Number: 06/28/2022, 3:05 PM  Clinical Narrative:                  CM spoke with patient at bedside about needs for post hospital transition. Admitted for Sepsis,on IV abx.  From [place]. Has a walker.  Patient has a Left foot Osteomyelitis, Podiatry consulted and recommending Vascular.   Patient cannot remember name of PCP. Permitted CM to speak with [name]. CM called [name] and left  a return call message.  Uses [facility] on [address].  Patient states he is active with [facility] with home health disciplines. Levada Dy notified of home health referral, info on AVS.  CM will continue to follow with needs as patient progresses with care.    Expected Discharge Plan: [place] Barriers to Discharge: Continued Medical Work up   Patient Goals and CMS Choice Patient states their goals for this hospitalization and ongoing recovery are:: To return to [place]. CMS Medicare.gov Compare Post Acute Care list provided to:: Patient Choice offered to / list presented to : Patient  Expected Discharge Plan and Services Expected Discharge Plan: [place] ([place]   Discharge Planning Services: CM Consult Post Acute Care Choice: [place] arrangements for the past 2 months: [place]                 DME Arranged: N/A DME Agency: NA       HH Arranged: PT, OT, RN, Disease Management [place] Agency: [place] Date [facility] Agency Contacted: 06/28/22 Time [place]: 2094 Representative spoke with at [place]: Levada Dy  Prior Living Arrangements/Services Living arrangements for the past 2 months: Pharmacist, hospital Lives with:: Facility Resident, Self Patient language and need for interpreter reviewed:: Yes Do you feel safe going back to the place where you live?: Yes      Need for Family Participation in Patient Care: Yes (Comment) Care giver support system in place?: Yes (comment)   Criminal Activity/Legal Involvement Pertinent to Current Situation/Hospitalization: No - Comment as needed  Activities of Daily Living Home Assistive Devices/Equipment: Eyeglasses, Environmental consultant (specify type) ADL Screening (condition at time of admission) Patient's cognitive ability adequate to safely complete daily activities?: Yes Is the patient deaf or have difficulty hearing?: No Does the patient have difficulty seeing, even when wearing glasses/contacts?: No Does the patient have difficulty concentrating, remembering, or making decisions?: Yes Patient able to express need for assistance with ADLs?: Yes Does the patient have difficulty dressing or bathing?: Yes Independently performs ADLs?: No Does the patient have difficulty walking or climbing stairs?: Yes Weakness of Legs: Both Weakness of Arms/Hands: Both  Permission Sought/Granted Permission sought to share information with : Case Manager, Customer service manager, Family Supports Permission granted to share information with : Yes, Verbal Permission Granted              Emotional Assessment Appearance:: Appears stated age Attitude/Demeanor/Rapport: Engaged, Gracious Affect (typically observed): Accepting, Appropriate, Calm, Hopeful, Pleasant Orientation: : Oriented to Self, Oriented to Place, Oriented to  Time, Oriented to Situation Alcohol / Substance Use: Not Applicable Psych Involvement: No (comment)  Admission diagnosis:  Hyponatremia [E87.1] AKI (acute kidney injury) ([place]) [  N17.9] Sepsis due to undetermined organism [facility]) [A41.9] Patient Active Problem List   Diagnosis Date Noted   Sepsis due to undetermined organism ([place])  06/25/2022   Dehydration with hyponatremia 06/25/2022   DKA (diabetic ketoacidoses) 04/16/2019   Atrial fibrillation ([place]) 12/15/2018   CKD (chronic kidney disease), symptom management only, stage 4 (severe) ([place]) 09/08/2018   Sepsis ([place]) 09/07/2018   Diabetic ulcer of toe of left foot associated with type 2 diabetes mellitus ([place])    Cellulitis of left anterior lower leg 07/25/2018   Iron deficiency anemia 07/24/2018   Acute kidney injury superimposed on chronic kidney disease ([place]) 07/24/2018   Hyponatremia 07/24/2018   Hypokalemia 03/88/8280   Metabolic acidosis 03/49/1791   Acute renal failure (ARF) ([place]) 07/24/2018   Normocytic anemia 07/24/2018   Thrombocytopenia ([place]) 07/24/2018   Ischemic cardiomyopathy    CAD (coronary artery disease)    PAF (paroxysmal atrial fibrillation) ([place])    Essential hypertension    Type II diabetes mellitus ([place])    Near syncope 04/01/2015   Dizziness and giddiness 04/01/2015   Fatigue 04/01/2015   Chronic renal disease, stage II 04/01/2015   RBBB 04/01/2015   Angina pectoris ([employer]) 04/01/2015   PAT (paroxysmal atrial tachycardia) ([place]) 12/16/2011   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 ([address]), [place] [address] [place] ([place])  [address] Phone: [phone] Fax: [phone]  [facility] [place], [place] [address] Phone: [phone] Fax: [phone]     Social Determinants of Health (SDOH) Interventions    Readmission Risk Interventions     No data to display

## 2022-06-28 NOTE — Progress Notes (Signed)
PROGRESS NOTE    Steve [name].  HRC:[id number] DOB: 1941-05-25 DOA: 06/25/2022 PCP: System, Provider Not In   Brief Narrative: Steve [name]. is a 81 y.o. male with a history of CAD s/p CABG, hypertension, hyperlipidemia, diabetes mellitus type 2, PAD, atrial fibrillation. Patient presented secondary to tremors and was found to have fevers with concern for possible infection. SIRS criteria met without source identified. Patient started empirically on Vancomycin, Cefepime and Flagyl. Blood and urine cultures obtained.   Assessment and Plan:  Sepsis Present on admission. Source is cellulitis. Chronic ulcer which could also be source as patient has confirmed osteomyelitis. Blood and urine cultures are no growth. Empiric Vancomycin, Cefepime and Flagyl initiated on admission and patient has been deescalated to Cefepime. -Continue Cefepime  AKI on CKD stage IIIa Patient's baseline creatinine appears to be around 1.2. Creatinine of 2.47 on admission. 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 Patient followed with podiatry previously. Confirmed osteomyelitis of the fifth metatarsal head/neck in addition to early osteomyelitis of the fifth digit proximal phalanx on MRI from 9/3. -Consult podiatry for evaluation/management  Hyponatremia Sodium of 126 on admission. Likely related to dehydration. Patient started on IV fluids. Sodium up to 130 today. -BMP in AM  Foot ulcer Cellulitis Patient with a chronic ulcer. Complicated by diabetes. Foot x-ray without evidence of osteomyelitis. Cellulitis significantly improved with antibiotics. -MRI left foot -Continue antibiotics as mentioned above pending MRI results  Acute urinary retention Unsure of etiology. Patient appears to be asymptomatic. Urinalysis is not suggestive of infection. Patient with continued urinary retention -Foley  catheter -Outpatient urology follow-up  Constipation Some improvement with bowel regimen.  Diabetes mellitus type 2 Patient is on Januvia, Levemir and metformin as an outpatient. Most recent hemoglobin A1C is 7.9%. -Continue Levemir and SSI  CAD Hyperlipidemia PAD No chest pain. Stable. -Continue Zetia  Primary hypertension -Continue Imdur and Toprol XL  Atrial fibrillation -Continue Eliquis and Toprol XL   DVT prophylaxis: Eliquis Code Status:   Code Status: Full Code Family Communication: None at bedside Disposition Plan: Discharge home vs SNF pending transition to oral antibiotics as needed, continued infectious workup, improvement of urinary retention   Consultants:  None  Procedures:  None  Antimicrobials: Vancomycin Cefepime Flagyl    Subjective: No issues this morning. Patient states he had a big bowel movement and good amount of urination, however per nursing patient has only voided minimally and continues to show evidence of retention on serial bladder scans.  Objective: BP (!) 159/73 (BP Location: Right Arm)   Pulse 70   Temp 98.5 F (36.9 C) (Oral)   Resp 18   Ht 5' 9"  (1.753 m)   Wt 87.8 kg   SpO2 94%   BMI 28.58 kg/m   Examination:  General exam: Appears calm and comfortable  Respiratory system: Clear to auscultation. Respiratory effort normal. Cardiovascular system: S1 & S2 heard, RRR. No murmurs, rubs, gallops or clicks. Gastrointestinal system: Abdomen is slightly distended, soft and nontender. No organomegaly or masses felt. Normal bowel sounds heard. Central nervous system: Alert. Musculoskeletal: No edema. No calf tenderness Skin: Left foot with some mild erythema on left dorsal aspect of foot   Data Reviewed: I have personally reviewed following labs and imaging studies  CBC Lab Results  Component Value Date   WBC 5.3 06/28/2022   RBC 2.66 (L) 06/28/2022   HGB 8.2 (L)  06/28/2022   HCT 23.1 (L) 06/28/2022   MCV 86.8  06/28/2022   MCH 30.8 06/28/2022   PLT 90 (L) 06/28/2022   MCHC 35.5 06/28/2022   RDW 14.4 06/28/2022   LYMPHSABS 0.3 (L) 06/25/2022   MONOABS 0.2 06/25/2022   EOSABS 0.1 06/25/2022   BASOSABS 0.0 43/32/9518     Last metabolic panel Lab Results  Component Value Date   NA 133 (L) 06/28/2022   K 3.5 06/28/2022   CL 108 06/28/2022   CO2 16 (L) 06/28/2022   BUN 20 06/28/2022   CREATININE 1.51 (H) 06/28/2022   GLUCOSE 95 06/28/2022   GFRNONAA 46 (L) 06/28/2022   GFRAA 46 (L) 05/19/2020   CALCIUM 8.4 (L) 06/28/2022   PHOS 2.8 06/25/2022   PROT 6.7 06/25/2022   ALBUMIN 3.3 (L) 06/25/2022   BILITOT 0.6 06/25/2022   ALKPHOS 50 06/25/2022   AST 24 06/25/2022   ALT 15 06/25/2022   ANIONGAP 9 06/28/2022    GFR: Estimated Creatinine Clearance: 40.6 mL/min (A) (by C-G formula based on SCr of 1.51 mg/dL (H)).  Recent Results (from the past 240 hour(s))  SARS Coronavirus 2 by RT PCR (hospital order, performed in [facility] hospital lab) *cepheid single result test* Anterior Nasal Swab     Status: None   Collection Time: 06/25/22  4:37 PM   Specimen: Anterior Nasal Swab  Result Value Ref Range Status   SARS Coronavirus 2 by RT PCR NEGATIVE NEGATIVE Final    Comment: (NOTE) SARS-CoV-2 target nucleic acids are NOT DETECTED.  The SARS-CoV-2 RNA is generally detectable in upper and lower respiratory specimens during the acute phase of infection. The lowest concentration of SARS-CoV-2 viral copies this assay can detect is 250 copies / mL. A negative result does not preclude SARS-CoV-2 infection and should not be used as the sole basis for treatment or other patient management decisions.  A negative result may occur with improper specimen collection / handling, submission of specimen other than nasopharyngeal swab, presence of viral mutation(s) within the areas targeted by this assay, and inadequate number of viral copies (<250 copies / mL). A negative result must be combined with  clinical observations, patient history, and epidemiological information.  Fact Sheet for Patients:   https://www.patel.info/  Fact Sheet for Healthcare Providers: https://hall.com/  This test is not yet approved or  cleared by the [place] FDA and has been authorized for detection and/or diagnosis of SARS-CoV-2 by FDA under an Emergency Use Authorization (EUA).  This EUA will remain in effect (meaning this test can be used) for the duration of the COVID-19 declaration under Section 564(b)(1) of the Act, 21 U.S.C. section 360bbb-3(b)(1), unless the authorization is terminated or revoked sooner.  Performed at [facility], [place] [address], [place], [place] [address]   Culture, blood (x 2)     Status: None (Preliminary result)   Collection Time: 06/25/22  8:37 PM   Specimen: BLOOD  Result Value Ref Range Status   Specimen Description BLOOD RIGHT ANTECUBITAL  Final   Special Requests   Final    BOTTLES DRAWN AEROBIC AND ANAEROBIC Blood Culture adequate volume   Culture   Final    NO GROWTH 3 DAYS Performed at [facility], [place] [address] [place], [place] [address]    Report Status PENDING  Incomplete  Culture, blood (x 2)     Status: None (Preliminary result)   Collection Time: 06/25/22  8:38 PM   Specimen: BLOOD  Result Value  Ref Range Status   Specimen Description BLOOD RIGHT ANTECUBITAL  Final   Special Requests   Final    BOTTLES DRAWN AEROBIC AND ANAEROBIC Blood Culture adequate volume   Culture   Final    NO GROWTH 3 DAYS Performed at [facility], [address] [place], [place] [address]    Report Status PENDING  Incomplete  Urine Culture     Status: None   Collection Time: 06/26/22  2:13 AM   Specimen: Urine, Clean Catch  Result Value Ref Range Status   Specimen Description URINE, CLEAN CATCH  Final   Special Requests NONE  Final   Culture   Final    NO GROWTH Performed at [facility], [place] [address], [place], [place] [address]    Report Status 06/27/2022 FINAL  Final      Radiology Studies: MR FOOT LEFT WO CONTRAST  Result Date: 06/27/2022 CLINICAL DATA:  Foot swelling, diabetic, osteomyelitis suspected, xray done EXAM: MRI OF THE LEFT FOOT WITHOUT CONTRAST TECHNIQUE: Multiplanar, multisequence MR imaging of the left forefoot was performed. No intravenous contrast was administered. COMPARISON:  Left radiograph 06/26/2022 FINDINGS: Bones/Joint/Cartilage There is marrow edema and low T1 signal within the distal fifth metatarsal head/neck. There is marrow edema and preserved T1 signal in the fifth toe proximal phalanx. Moderate first MTP osteoarthritis. Additional scattered marrow edema which is likely reactive. Ligaments Intact Lisfranc ligament. Muscles and Tendons Diffuse intramuscular edema and atrophy in the foot as is commonly seen in diabetics. Soft tissues There is a lateral forefoot soft tissue ulcer adjacent to the fifth metatarsal head. There is no well-defined/drainable fluid collection. There is mild generalized soft tissue swelling of the foot. 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 No evidence of soft tissue abscess. Electronically Signed   By: Maurine Simmering M.D.   On: 06/27/2022 14:06      LOS: 3 days    Cordelia Poche, MD [facility] 06/28/2022, 12:56 PM   If 7PM-7AM, please contact night-coverage [email or web address]

## 2022-06-28 NOTE — Care Management Important Message (Signed)
Important Message  Patient Details  Name: Steve [name]. MRN: [id number] Date of Birth: 1941-04-29   Medicare Important Message Given:  Yes     Mirha Brucato 06/28/2022, 12:26 PM

## 2022-06-28 NOTE — Progress Notes (Signed)
Physical Therapy Treatment Patient Details Name: Steve [name]. MRN: [id number] DOB: 12-20-1940 Today's Date: 06/28/2022   History of Present Illness 81 y.o. male presenting 06/25/22 with tremors. He reports that he felt bad today.  Some tremors; AKI, hyponatremia, sepsis of ?origin   PMH significant of CAD s/p CABG; HTN; HLD; DM; PAD; and afib    PT Comments    Patient willing to work with PT. Reports he has had no appetite while hospitalized and feels he is getting weaker. Able to ambulate 220 ft with RW and minguard assist with no imbalance noted. Discharge plan remains appropriate.     Recommendations for follow up therapy are one component of a multi-disciplinary discharge planning process, led by the attending physician.  Recommendations may be updated based on patient status, additional functional criteria and insurance authorization.  Follow Up Recommendations  Home health PT     Assistance Recommended at Discharge PRN  Patient can return home with the following Assistance with cooking/housework;Assist for transportation;Help with stairs or ramp for entrance   Equipment Recommendations  None recommended by PT    Recommendations for Other Services       Precautions / Restrictions Precautions Precautions: Fall Restrictions Weight Bearing Restrictions: No     Mobility  Bed Mobility Overal bed mobility: Modified Independent             General bed mobility comments: incr time, HOB elevated 30    Transfers Overall transfer level: Needs assistance Equipment used: Rolling walker (2 wheels) Transfers: Sit to/from Stand Sit to Stand: Min guard, Min assist           General transfer comment: assist needed x1; repeated 2 more times without assist    Ambulation/Gait Ambulation/Gait assistance: Min guard Gait Distance (Feet): 220 Feet Assistive device: Rolling walker (2 wheels) Gait Pattern/deviations: Step-through pattern, Decreased stride length, Wide base  of support   Gait velocity interpretation: >2.62 ft/sec, indicative of community ambulatory   General Gait Details: no imbalance noted; maintained straight course   Stairs             Wheelchair Mobility    Modified Rankin (Stroke Patients Only)       Balance Overall balance assessment: Needs assistance Sitting-balance support: No upper extremity supported, Feet supported Sitting balance-Leahy Scale: Good     Standing balance support: Bilateral upper extremity supported, Reliant on assistive device for balance Standing balance-Leahy Scale: Poor                              Cognition Arousal/Alertness: Awake/alert Behavior During Therapy: WFL for tasks assessed/performed Overall Cognitive Status: Within Functional Limits for tasks assessed                                          Exercises Other Exercises Other Exercises: bridging x 10 reps    General Comments        Pertinent Vitals/Pain Pain Assessment Pain Assessment: No/denies pain    Home Living                          Prior Function            PT Goals (current goals can now be found in the care plan section) Acute Rehab PT Goals Patient Stated Goal: return to  [place] living Time For Goal Achievement: 07/10/22 Potential to Achieve Goals: Good Progress towards PT goals: Progressing toward goals    Frequency    Min 3X/week      PT Plan Current plan remains appropriate    Co-evaluation              AM-PAC PT "6 Clicks" Mobility   Outcome Measure  Help needed turning from your back to your side while in a flat bed without using bedrails?: None Help needed moving from lying on your back to sitting on the side of a flat bed without using bedrails?: None Help needed moving to and from a bed to a chair (including a wheelchair)?: A Little Help needed standing up from a chair using your arms (e.g., wheelchair or bedside chair)?: A  Little Help needed to walk in hospital room?: A Little Help needed climbing 3-5 steps with a railing? : A Lot 6 Click Score: 19    End of Session Equipment Utilized During Treatment: Gait belt Activity Tolerance: Patient tolerated treatment well Patient left: in bed;with call bell/phone within reach;with bed alarm set Nurse Communication: Mobility status PT Visit Diagnosis: Difficulty in walking, not elsewhere classified (R26.2)     Time: 9147-8295 PT Time Calculation (min) (ACUTE ONLY): 22 min  Charges:  $Gait Training: 8-22 mins                      Arby Barrette, PT Acute Rehabilitation Services  Office [phone]    Rexanne Mano 06/28/2022, 1:03 PM

## 2022-06-28 NOTE — Consult Note (Signed)
Reason for Consult: Osteomyelitis left foot Referring Physician: Dr. Cordelia Poche, MD  Steve Mask. is an 81 y.o. male.  HPI: 81 year old male with past medical history significant for CAD status post CABG, hypertension, hyperlipidemia, type 2 diabetes, PAD, A-fib presented secondary to tremors and was found to have fevers and concern for infection.  MRI was performed the left foot which revealed osteomyelitis.  Patient had a wound to the fifth metatarsal head for the last several months.  Has been recently treated at the wound care center for this.  Per the patient the wound has been doing good and there is been no obvious signs of infection.  Currently denies any fevers or chills.  Past Medical History:  Diagnosis Date   CAD (coronary artery disease)    a. s/p CABG x 29 Nov 2011 with LIMA to LAD, SVG to OM1 and distal LCX, SVG to ramus intermediate   Essential hypertension    Hyperlipidemia    Intermittent claudication ([place])    Ischemic cardiomyopathy    a. 03/2015 Echo: EF45-50%, Gr1 DD, mild MR.   PAF (paroxysmal atrial fibrillation) ([place])    a. post op atrial fib 11/2011; short course of amiodarone; stopped 01/25/12   Peripheral arterial disease ([place])    Right bundle branch block    Type II diabetes mellitus ([place])     Past Surgical History:  Procedure Laterality Date   CARDIAC CATHETERIZATION  01/16/1999   Est. EF of 65% -- Nonobstruction atherosclerotic coronary artery disease -- Normal left ventricular function       CARDIAC ELECTROPHYSIOLOGY STUDY AND ABLATION  05/21/1999   Normal sinus funtion -- Mildly prolonged interatrial conduction times -- Normal A-V node funtion -- Normal His Purkinje system function -- fNo accessory pathway -- No inducible ventricular tachycardia in the presence of the or in the absence of isoproterenol with programmed stimulation or with burst pacing -- Nikki Dom, M.D. [place]   CORONARY ARTERY BYPASS GRAFT   12/10/2011   Procedure: CORONARY ARTERY BYPASS GRAFTING (CABG);  Surgeon: Tharon Aquas Adelene Idler, MD;  Location: [facility];  Service: Open Heart Surgery;  Laterality: N/A;  Coronary Artery bypass graft on pump times four utilizing left internal mammary artery and left saphenous vein harvested endoscopically   LEFT HEART CATH AND CORS/GRAFTS ANGIOGRAPHY N/A 12/18/2018   Procedure: LEFT HEART CATH AND CORS/GRAFTS ANGIOGRAPHY;  Surgeon: Belva Crome, MD;  Location: [facility];  Service: Cardiovascular;  Laterality: N/A;   LEFT HEART CATHETERIZATION WITH CORONARY ANGIOGRAM N/A 12/09/2011   Procedure: LEFT HEART CATHETERIZATION WITH CORONARY ANGIOGRAM;  Surgeon: Peter M Martinique, MD;  Location: [facility] CATH LAB;  Service: Cardiovascular;  Laterality: N/A;   LOWER EXTREMITY ANGIOGRAM Left 07/28/2018   Procedure: Lower Extremity Angiogram;  Surgeon: Angelia Mould, MD;  Location: [facility];  Service: Cardiovascular;  Laterality: Left;    Family History  Problem Relation Age of Onset   Stroke Mother 14       Cerebellar hemorrhage    Social History:  reports that he quit smoking about 43 years ago. His smoking use included cigarettes. He has a 30.00 pack-year smoking history. He has quit using smokeless tobacco. He reports current alcohol use of about 4.0 standard drinks of alcohol per week. He reports that he does not use drugs.  Allergies:  Allergies  Allergen Reactions   Januvia [Sitagliptin] Other (See Comments)  Soreness to stomach area. Intolerance    Statins Other (See Comments)    Myalgias, muscle weakness, swelling, pain    Medications: I have reviewed the patient's current medications.  Results for orders placed or performed during the hospital encounter of 06/25/22 (from the past 48 hour(s))  Glucose, capillary     Status: Abnormal   Collection Time: 06/26/22  5:00 PM  Result Value Ref Range   Glucose-Capillary 134 (H) 70 - 99 mg/dL    Comment: Glucose reference range  applies only to samples taken after fasting for at least 8 hours.  Glucose, capillary     Status: Abnormal   Collection Time: 06/26/22  8:54 PM  Result Value Ref Range   Glucose-Capillary 123 (H) 70 - 99 mg/dL    Comment: Glucose reference range applies only to samples taken after fasting for at least 8 hours.  Procalcitonin     Status: None   Collection Time: 06/27/22  4:44 AM  Result Value Ref Range   Procalcitonin 1.65 ng/mL    Comment:        Interpretation: PCT > 0.5 ng/mL and <= 2 ng/mL: Systemic infection (sepsis) is possible, but other conditions are known to elevate PCT as well. (NOTE)       Sepsis PCT Algorithm           Lower Respiratory Tract                                      Infection PCT Algorithm    ----------------------------     ----------------------------         PCT < 0.25 ng/mL                PCT < 0.10 ng/mL          Strongly encourage             Strongly discourage   discontinuation of antibiotics    initiation of antibiotics    ----------------------------     -----------------------------       PCT 0.25 - 0.50 ng/mL            PCT 0.10 - 0.25 ng/mL               OR       >80% decrease in PCT            Discourage initiation of                                            antibiotics      Encourage discontinuation           of antibiotics    ----------------------------     -----------------------------         PCT >= 0.50 ng/mL              PCT 0.26 - 0.50 ng/mL                AND       <80% decrease in PCT             Encourage initiation of  antibiotics       Encourage continuation           of antibiotics    ----------------------------     -----------------------------        PCT >= 0.50 ng/mL                  PCT > 0.50 ng/mL               AND         increase in PCT                  Strongly encourage                                      initiation of antibiotics    Strongly encourage escalation            of antibiotics                                     -----------------------------                                           PCT <= 0.25 ng/mL                                                 OR                                        > 80% decrease in PCT                                      Discontinue / Do not initiate                                             antibiotics  Performed at [facility], [address] [place], [place] [address]   Basic metabolic panel     Status: Abnormal   Collection Time: 06/27/22  4:44 AM  Result Value Ref Range   Sodium 130 (L) 135 - 145 mmol/L   Potassium 3.6 3.5 - 5.1 mmol/L   Chloride 105 98 - 111 mmol/L   CO2 15 (L) 22 - 32 mmol/L   Glucose, Bld 110 (H) 70 - 99 mg/dL    Comment: Glucose reference range applies only to samples taken after fasting for at least 8 hours.   BUN 23 8 - 23 mg/dL   Creatinine, Ser 1.69 (H) 0.61 - 1.24 mg/dL   Calcium 8.1 (L) 8.9 - 10.3 mg/dL   GFR, Estimated 40 (L) >60 mL/min    Comment: (NOTE) Calculated using the CKD-EPI Creatinine Equation (2021)    Anion gap 10 5 - 15    Comment: Performed at [facility] [address]  [place], [place] [address]  CBC     Status: Abnormal   Collection Time: 06/27/22  4:44 AM  Result Value Ref Range   WBC 5.0 4.0 - 10.5 K/uL   RBC 2.76 (L) 4.22 - 5.81 MIL/uL   Hemoglobin 8.6 (L) 13.0 - 17.0 g/dL   HCT 23.9 (L) 39.0 - 52.0 %   MCV 86.6 80.0 - 100.0 fL   MCH 31.2 26.0 - 34.0 pg   MCHC 36.0 30.0 - 36.0 g/dL   RDW 14.3 11.5 - 15.5 %   Platelets 101 (L) 150 - 400 K/uL    Comment: Immature Platelet Fraction may be clinically indicated, consider ordering this additional test [id number] REPEATED TO VERIFY    nRBC 0.0 0.0 - 0.2 %    Comment: Performed at [facility], [place] [address] [place], [place] [address]  Glucose, capillary     Status: Abnormal   Collection Time: 06/27/22  7:21 AM  Result Value Ref Range   Glucose-Capillary 117 (H) 70 - 99  mg/dL    Comment: Glucose reference range applies only to samples taken after fasting for at least 8 hours.  Glucose, capillary     Status: Abnormal   Collection Time: 06/27/22 11:21 AM  Result Value Ref Range   Glucose-Capillary 114 (H) 70 - 99 mg/dL    Comment: Glucose reference range applies only to samples taken after fasting for at least 8 hours.  Glucose, capillary     Status: Abnormal   Collection Time: 06/27/22  5:15 PM  Result Value Ref Range   Glucose-Capillary 136 (H) 70 - 99 mg/dL    Comment: Glucose reference range applies only to samples taken after fasting for at least 8 hours.  Glucose, capillary     Status: Abnormal   Collection Time: 06/27/22  9:25 PM  Result Value Ref Range   Glucose-Capillary 104 (H) 70 - 99 mg/dL    Comment: Glucose reference range applies only to samples taken after fasting for at least 8 hours.  Basic metabolic panel     Status: Abnormal   Collection Time: 06/28/22  3:07 AM  Result Value Ref Range   Sodium 133 (L) 135 - 145 mmol/L   Potassium 3.5 3.5 - 5.1 mmol/L   Chloride 108 98 - 111 mmol/L   CO2 16 (L) 22 - 32 mmol/L   Glucose, Bld 95 70 - 99 mg/dL    Comment: Glucose reference range applies only to samples taken after fasting for at least 8 hours.   BUN 20 8 - 23 mg/dL   Creatinine, Ser 1.51 (H) 0.61 - 1.24 mg/dL   Calcium 8.4 (L) 8.9 - 10.3 mg/dL   GFR, Estimated 46 (L) >60 mL/min    Comment: (NOTE) Calculated using the CKD-EPI Creatinine Equation (2021)    Anion gap 9 5 - 15    Comment: Performed at [facility] [address], [place], [place] [address]  CBC     Status: Abnormal   Collection Time: 06/28/22  3:07 AM  Result Value Ref Range   WBC 5.3 4.0 - 10.5 K/uL   RBC 2.66 (L) 4.22 - 5.81 MIL/uL   Hemoglobin 8.2 (L) 13.0 - 17.0 g/dL   HCT 23.1 (L) 39.0 - 52.0 %   MCV 86.8 80.0 - 100.0 fL   MCH 30.8 26.0 - 34.0 pg   MCHC 35.5 30.0 - 36.0 g/dL   RDW 14.4 11.5 - 15.5 %   Platelets 90 (L) 150 - 400  K/uL    Comment:  REPEATED TO VERIFY   nRBC 0.0 0.0 - 0.2 %    Comment: Performed at [facility], [place] [address], [place], [place] [address]  Glucose, capillary     Status: None   Collection Time: 06/28/22  7:42 AM  Result Value Ref Range   Glucose-Capillary 99 70 - 99 mg/dL    Comment: Glucose reference range applies only to samples taken after fasting for at least 8 hours.  Glucose, capillary     Status: Abnormal   Collection Time: 06/28/22 11:24 AM  Result Value Ref Range   Glucose-Capillary 105 (H) 70 - 99 mg/dL    Comment: Glucose reference range applies only to samples taken after fasting for at least 8 hours.    MR FOOT LEFT WO CONTRAST  Result Date: 06/27/2022 CLINICAL DATA:  Foot swelling, diabetic, osteomyelitis suspected, xray done EXAM: MRI OF THE LEFT FOOT WITHOUT CONTRAST TECHNIQUE: Multiplanar, multisequence MR imaging of the left forefoot was performed. No intravenous contrast was administered. COMPARISON:  Left radiograph 06/26/2022 FINDINGS: Bones/Joint/Cartilage There is marrow edema and low T1 signal within the distal fifth metatarsal head/neck. There is marrow edema and preserved T1 signal in the fifth toe proximal phalanx. Moderate first MTP osteoarthritis. Additional scattered marrow edema which is likely reactive. Ligaments Intact Lisfranc ligament. Muscles and Tendons Diffuse intramuscular edema and atrophy in the foot as is commonly seen in diabetics. Soft tissues There is a lateral forefoot soft tissue ulcer adjacent to the fifth metatarsal head. There is no well-defined/drainable fluid collection. There is mild generalized soft tissue swelling of the foot. 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 No evidence of soft tissue abscess. Electronically Signed   By: Maurine Simmering M.D.   On: 06/27/2022 14:06    Review of Systems Blood pressure (!) 159/73, pulse  70, temperature 98.5 F (36.9 C), temperature source Oral, resp. rate 18, height 5\' 9"  (1.753 m), weight 87.8 kg, SpO2 94 %. Physical Exam General: AAO x3, NAD  Dermatological: Ulceration of the left foot fifth metatarsal head with mild rim of erythema there is no ascending cellulitis.  No significant erythema to the toe.  No fluctuation or crepitation.  No malodor.  See picture below.       Vascular: Pedal pulses decreased  Neruologic: Sensation decreased  Musculoskeletal: Hammertoes present.  No other areas of discomfort.   Assessment/Plan: Osteomyelitis left foot  Clinically the foot does not appear to be overly infected.  MRI was reviewed with the patient.  We discussed osteomyelitis as well as surgical treatment options for this.  Discussed limb salvage with long-term antibiotics versus amputation.  He is amenable to amputation.  However I am concerned about his circulatory status.  Recommend vascular surgery evaluation given nonhealing ulceration, PAD.  Podiatry will continue to follow.  Trula Slade 06/28/2022, 1:57 PM

## 2022-06-28 NOTE — Progress Notes (Signed)
Occupational Therapy Treatment Patient Details Name: Steve [name]. MRN: [id number] DOB: September 18, 1941 Today's Date: 06/28/2022   History of present illness 81 y.o. male presenting 06/25/22 with tremors. He reports that he felt bad today.  Some tremors; AKI, hyponatremia, sepsis of ?origin   PMH significant of CAD s/p CABG; HTN; HLD; DM; PAD; and afib   OT comments  Pt with weakness requiring min guard assist with RW for ambulation to bathroom and to groom at sink. He can access his L foot to don sock, but can't see the wound. Reports he has a HHRN that comes 3x a week. Pt states his appetite is poor and he is distracted by thoughts of his time in [place] which is unusual for him and distracts from sleep. Continues to be appropriate for HHOT. Encouraged OOB to chair for meals and ambulating with staff.    Recommendations for follow up therapy are one component of a multi-disciplinary discharge planning process, led by the attending physician.  Recommendations may be updated based on patient status, additional functional criteria and insurance authorization.    Follow Up Recommendations  Home health OT    Assistance Recommended at Discharge Set up Supervision/Assistance  Patient can return home with the following  A little help with walking and/or transfers;A little help with bathing/dressing/bathroom;Direct supervision/assist for medications management;Direct supervision/assist for financial management;Help with stairs or ramp for entrance   Equipment Recommendations  None recommended by OT    Recommendations for Other Services      Precautions / Restrictions Precautions Precautions: Fall Restrictions Weight Bearing Restrictions: No       Mobility Bed Mobility Overal bed mobility: Modified Independent             General bed mobility comments: HOB slightly elevated    Transfers Overall transfer level: Needs assistance Equipment used: Rolling walker (2 wheels) Transfers:  Sit to/from Stand Sit to Stand: Min guard                 Balance Overall balance assessment: Needs assistance   Sitting balance-Leahy Scale: Good     Standing balance support: Bilateral upper extremity supported, Reliant on assistive device for balance Standing balance-Leahy Scale: Poor                             ADL either performed or assessed with clinical judgement   ADL Overall ADL's : Needs assistance/impaired     Grooming: Supervision/safety;Standing               Lower Body Dressing: Set up;Sitting/lateral leans Lower Body Dressing Details (indicate cue type and reason): cannot see wound on L foot, but can don sock Toilet Transfer: Min guard;Ambulation   Toileting- Clothing Manipulation and Hygiene: Min guard;Sit to/from stand       Functional mobility during ADLs: Min guard;Rolling walker (2 wheels)      Extremity/Trunk Assessment              Vision       Perception     Praxis      Cognition Arousal/Alertness: Awake/alert Behavior During Therapy: WFL for tasks assessed/performed Overall Cognitive Status: Within Functional Limits for tasks assessed                                          Exercises  Shoulder Instructions       General Comments      Pertinent Vitals/ Pain       Pain Assessment Pain Assessment: Faces Faces Pain Scale: Hurts little more Pain Location: L foot Pain Descriptors / Indicators: Sharp Pain Intervention(s): Monitored during session, Repositioned  Home Living                                          Prior Functioning/Environment              Frequency  Min 2X/week        Progress Toward Goals  OT Goals(current goals can now be found in the care plan section)  Progress towards OT goals: Progressing toward goals  Acute Rehab OT Goals OT Goal Formulation: With patient Time For Goal Achievement: 07/10/22 Potential to Achieve Goals:  Good  Plan Discharge plan remains appropriate    Co-evaluation                 AM-PAC OT "6 Clicks" Daily Activity     Outcome Measure   Help from another person eating meals?: None Help from another person taking care of personal grooming?: A Little Help from another person toileting, which includes using toliet, bedpan, or urinal?: A Little Help from another person bathing (including washing, rinsing, drying)?: A Little Help from another person to put on and taking off regular upper body clothing?: None Help from another person to put on and taking off regular lower body clothing?: A Little 6 Click Score: 20    End of Session Equipment Utilized During Treatment: Rolling walker (2 wheels)  OT Visit Diagnosis: Unsteadiness on feet (R26.81);Other abnormalities of gait and mobility (R26.89);Muscle weakness (generalized) (M62.81)   Activity Tolerance Patient tolerated treatment well   Patient Left in bed;with call bell/phone within reach   Nurse Communication          Time: 1537-1600 OT Time Calculation (min): 23 min  Charges: OT General Charges $OT Visit: 1 Visit OT Treatments $Self Care/Home Management : 23-37 mins  Cleta Alberts, OTR/L Acute Rehabilitation Services Office: [phone]  Steve [name] 06/28/2022, 4:36 PM

## 2022-06-29 ENCOUNTER — Encounter (HOSPITAL_COMMUNITY): Payer: Medicare Other

## 2022-06-29 DIAGNOSIS — M86672 Other chronic osteomyelitis, left ankle and foot: Secondary | ICD-10-CM | POA: Diagnosis not present

## 2022-06-29 DIAGNOSIS — E871 Hypo-osmolality and hyponatremia: Secondary | ICD-10-CM | POA: Diagnosis not present

## 2022-06-29 DIAGNOSIS — M86179 Other acute osteomyelitis, unspecified ankle and foot: Secondary | ICD-10-CM

## 2022-06-29 DIAGNOSIS — I739 Peripheral vascular disease, unspecified: Secondary | ICD-10-CM | POA: Diagnosis not present

## 2022-06-29 DIAGNOSIS — N179 Acute kidney failure, unspecified: Secondary | ICD-10-CM | POA: Diagnosis not present

## 2022-06-29 DIAGNOSIS — E860 Dehydration: Secondary | ICD-10-CM | POA: Diagnosis not present

## 2022-06-29 DIAGNOSIS — A419 Sepsis, unspecified organism: Secondary | ICD-10-CM | POA: Diagnosis not present

## 2022-06-29 LAB — APTT: aPTT: 38 seconds — ABNORMAL HIGH (ref 24–36)

## 2022-06-29 LAB — GLUCOSE, CAPILLARY
Glucose-Capillary: 90 mg/dL (ref 70–99)
Glucose-Capillary: 102 mg/dL — ABNORMAL HIGH (ref 70–99)
Glucose-Capillary: 100 mg/dL — ABNORMAL HIGH (ref 70–99)
Glucose-Capillary: 140 mg/dL — ABNORMAL HIGH (ref 70–99)

## 2022-06-29 LAB — HEPARIN LEVEL (UNFRACTIONATED): Heparin Unfractionated: >1.1 IU/mL — ABNORMAL HIGH (ref 0.30–0.70)

## 2022-06-29 MED ORDER — HEPARIN (PORCINE) 25000 UT/250ML-% IV SOLN
1750.0000 [IU]/h | INTRAVENOUS | Status: DC
Start: 2022-06-29 — End: 2022-06-30
  Administered 2022-06-29: 1750 [IU]/h via INTRAVENOUS
  Filled 2022-06-29: qty 250

## 2022-06-29 NOTE — Progress Notes (Signed)
Physical Therapy Treatment Patient Details Name: Steve [name]. MRN: [id number] DOB: 09-28-1941 Today's Date: 06/29/2022   History of Present Illness 81 y.o. male presenting 06/25/22 with tremors. He reports that he felt bad today.  Some tremors; AKI, hyponatremia, sepsis of ?origin   PMH significant of CAD s/p CABG; HTN; HLD; DM; PAD; and afib    PT Comments    Patient deferred PT this a.m. but agreed to ambulate this afternoon. Patient with depressed mood and states he feels he is getting weaker. Today ambulated shorter distance with pt becoming slightly shakey/unsteady after 45 feet and returned to room. Assisted to bathroom. Grandson arrived and pt returned to bed to visit with family.     Recommendations for follow up therapy are one component of a multi-disciplinary discharge planning process, led by the attending physician.  Recommendations may be updated based on patient status, additional functional criteria and insurance authorization.  Follow Up Recommendations  Home health PT     Assistance Recommended at Discharge PRN  Patient can return home with the following Assistance with cooking/housework;Assist for transportation;Help with stairs or ramp for entrance   Equipment Recommendations  None recommended by PT    Recommendations for Other Services       Precautions / Restrictions Precautions Precautions: Fall Restrictions Weight Bearing Restrictions: No     Mobility  Bed Mobility Overal bed mobility: Modified Independent             General bed mobility comments: HOB slightly elevated    Transfers Overall transfer level: Needs assistance Equipment used: Rolling walker (2 wheels) Transfers: Sit to/from Stand Sit to Stand: Min assist           General transfer comment: assist needed for boosting    Ambulation/Gait Ambulation/Gait assistance: Min assist Gait Distance (Feet): 90 Feet (seated rest, 15 ft x 2 to/from toilet) Assistive device: Rolling  walker (2 wheels) Gait Pattern/deviations: Step-through pattern, Decreased stride length, Wide base of support   Gait velocity interpretation: >2.62 ft/sec, indicative of community ambulatory   General Gait Details: slightly shakey/unsteady feeling as gait progressed and pt reporting he was not feeling as steady as usual   Marine scientist [place] (Stroke Patients Only)       Balance Overall balance assessment: Needs assistance Sitting-balance support: No upper extremity supported, Feet supported Sitting balance-Leahy Scale: Good     Standing balance support: Bilateral upper extremity supported, Reliant on assistive device for balance Standing balance-Leahy Scale: Poor                              Cognition Arousal/Alertness: Awake/alert Behavior During Therapy: WFL for tasks assessed/performed Overall Cognitive Status: Within Functional Limits for tasks assessed                                          Exercises      General Comments        Pertinent Vitals/Pain Pain Assessment Pain Assessment: No/denies pain    Home Living                          Prior Function            PT Goals (current  goals can now be found in the care plan section) Acute Rehab PT Goals Patient Stated Goal: return to [facility] living PT Goal Formulation: With patient Time For Goal Achievement: 07/10/22 Potential to Achieve Goals: Good Progress towards PT goals: Progressing toward goals    Frequency    Min 3X/week      PT Plan Current plan remains appropriate    Co-evaluation              AM-PAC PT "6 Clicks" Mobility   Outcome Measure  Help needed turning from your back to your side while in a flat bed without using bedrails?: None Help needed moving from lying on your back to sitting on the side of a flat bed without using bedrails?: None Help needed moving to and from a  bed to a chair (including a wheelchair)?: A Little Help needed standing up from a chair using your arms (e.g., wheelchair or bedside chair)?: A Little Help needed to walk in hospital room?: A Little Help needed climbing 3-5 steps with a railing? : A Lot 6 Click Score: 19    End of Session Equipment Utilized During Treatment: Gait belt Activity Tolerance: Other (comment) (feeling weaker) Patient left: in bed;with call bell/phone within reach;with bed alarm set;with family/visitor present Nurse Communication: Mobility status PT Visit Diagnosis: Difficulty in walking, not elsewhere classified (R26.2)     Time: 5945-8592 PT Time Calculation (min) (ACUTE ONLY): 26 min  Charges:  $Gait Training: 23-37 mins                      Arby Barrette, [place]  Office [phone]    Rexanne Mano 06/29/2022, 3:28 PM

## 2022-06-29 NOTE — Progress Notes (Signed)
   PODIATRY PROGRESS NOTE  NAME [name] MRN [id number] DOB 1941-03-12 DOA 06/25/2022   Reason for consult:  Chief Complaint  Patient presents with   Tremors     History of present illness: 81 y.o. male with left foot wound and likely osteomyelitis.  Vascular surgery been consulted.  He is scheduled for angio on Friday.  Intermittent pain to his feet that he reports but currently no pain.  Vitals:   06/29/22 0606 06/29/22 1622  BP: (!) 142/45 130/62  Pulse: (!) 59 70  Resp: 18   Temp: 98.5 F (36.9 C)   SpO2: 95%        Latest Ref Rng & Units 06/28/2022    3:07 AM 06/27/2022    4:44 AM 06/26/2022    5:13 AM  CBC  WBC 4.0 - 10.5 K/uL 5.3  5.0  6.9   Hemoglobin 13.0 - 17.0 g/dL 8.2  8.6  8.6   Hematocrit 39.0 - 52.0 % 23.1  23.9  23.3   Platelets 150 - 400 K/uL 90  101  103        Latest Ref Rng & Units 06/28/2022    3:07 AM 06/27/2022    4:44 AM 06/26/2022    5:13 AM  BMP  Glucose 70 - 99 mg/dL 95  110  177   BUN 8 - 23 mg/dL 20  23  23    Creatinine 0.61 - 1.24 mg/dL 1.51  1.69  1.91   Sodium 135 - 145 mmol/L 133  130  128   Potassium 3.5 - 5.1 mmol/L 3.5  3.6  4.0   Chloride 98 - 111 mmol/L 108  105  103   CO2 22 - 32 mmol/L 16  15  14    Calcium 8.9 - 10.3 mg/dL 8.4  8.1  8.0       Physical Exam: General: AAOx3, NAD  Dermatology: Wound appears to be unchanged compared to yesterday.  Ulceration present submetatarsal 5 left foot without any probing to bone.  There is minimal edema.  There is no frank purulence noted.  No significant erythema.  No ascending cellulitis.  Vascular: Pulses decreased  Neurological: Sensation decreased  Musculoskeletal Exam: No pain on exam    ASSESSMENT/PLAN OF CARE  Left foot ulceration, osteomyelitis, PAD  -Vascular surgery been consulted.  Repeat angio scheduled for later this week to evaluate for potential options for revascularization.  Otherwise if the wound progresses will likely need to proceed with amputation if the  wound deteriorates.  He is not interested in above-knee or below-knee amputation he again discusses with me today. -For now continue antibiotics, local wound care, offloading. -Podiatry will continue to follow.    Please contact me directly with any questions or concerns.     Celesta Gentile, DPM [facility]  Dr. Bonna Gains. [address], [address], [place] [address]                Office [phone]  Fax [phone]

## 2022-06-29 NOTE — Consult Note (Signed)
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  Based on his previous arteriogram in 2019 he has diffuse calcific disease in the left lower extremity and severe tibial artery occlusive disease.  The study was done with very limited contrast because of his renal insufficiency and no patent tibial vessels were identified.  His kidney function has improved and his GFR is 46.  I do not think he has adequate circulation to heal a 5th toe amputation.  Thus I would recommend that we proceed with repeat arteriography to see if there are any potential options for revascularization.  Otherwise, if the wound progresses he would require an above-the-knee or below the knee amputation and he absolutely refuses to consider this.  He is okay with proceeding with arteriography.  I have discussed the indications for the procedure and the potential complications.  He has been on Eliquis and we will have to hold this for 48 hours.  I will schedule his arteriogram for Friday.  I would continue gentle hydration in the meantime.  REASON FOR CONSULT:    Osteomyelitis left foot with peripheral arterial disease.  The consult is requested by Dr. Lonny Prude.   HPI:   Steve [name]. is a 81 y.o. male right seen back in March of this year with peripheral arterial disease.  He had evidence of infrainguinal arterial occlusive disease bilaterally.  Based on a previous arteriogram he had severe tibial artery occlusive disease on the left with no further options for revascularization.  When I saw him last he was not having any significant symptoms.  In addition he was not anxious to consider arteriography at that time.  He tells me that he has had a wound on the plantar aspect of the left foot for the last several months.  He had an MRI performed which showed evidence of osteomyelitis.  The wound is is on the plantar  surface of the foot over the fifth metatarsal head.  The patient is undergone previous coronary revascularization.  He has a history of ischemic cardiomyopathy.  He is on Eliquis for A-fib.  His risk factors for peripheral arterial disease include type 2 diabetes, hypertension, and hyperlipidemia.  He has a remote history of tobacco abuse.  He quit in 1980.  He denies any family history of premature cardiovascular disease.  Past Medical History:  Diagnosis Date   CAD (coronary artery disease)    a. s/p CABG x 29 Nov 2011 with LIMA to LAD, SVG to OM1 and distal LCX, SVG to ramus intermediate   Essential hypertension    Hyperlipidemia    Intermittent claudication ([place])    Ischemic cardiomyopathy    a. 03/2015 Echo: EF45-50%, Gr1 DD, mild MR.   PAF (paroxysmal atrial fibrillation) (HCC)    a. post op atrial fib 11/2011; short course of amiodarone; stopped 01/25/12   Peripheral arterial disease ([place])    Right bundle branch block    Type II diabetes mellitus ([place])     Family History  Problem Relation Age of Onset   Stroke Mother 69       Cerebellar hemorrhage    SOCIAL HISTORY: Social History   Tobacco Use   Smoking status: Former    Packs/day: 1.50    Years: 20.00    Total pack years: 30.00    Types: Cigarettes    Quit date: 05/27/1979    Years  since quitting: 43.1   Smokeless tobacco: Former  Substance Use Topics   Alcohol use: Yes    Alcohol/week: 4.0 standard drinks of alcohol    Types: 4 Shots of liquor per week    Comment: 4 "canadian clubs each night"    Allergies  Allergen Reactions   Januvia [Sitagliptin] Other (See Comments)    Soreness to stomach area. Intolerance    Statins Other (See Comments)    Myalgias, muscle weakness, swelling, pain    Current Facility-Administered Medications  Medication Dose Route Frequency Provider Last Rate Last Admin   acetaminophen (TYLENOL) tablet 650 mg  650 mg Oral Q6H PRN Karmen Bongo, MD   650 mg at 06/25/22 1909   Or    acetaminophen (TYLENOL) suppository 650 mg  650 mg Rectal Q6H PRN Karmen Bongo, MD       albuterol (PROVENTIL) (2.5 MG/3ML) 0.083% nebulizer solution 2.5 mg  2.5 mg Nebulization Q2H PRN Karmen Bongo, MD       apixaban [name]) tablet 2.5 mg  2.5 mg Oral BID Karmen Bongo, MD   2.5 mg at 06/29/22 0947   bisacodyl (DULCOLAX) EC tablet 5 mg  5 mg Oral Daily PRN Karmen Bongo, MD       ceFEPIme (MAXIPIME) 2 g in sodium chloride 0.9 % 100 mL IVPB  2 g Intravenous Q12H Pham, Minh Q, RPH-CPP 200 mL/hr at 06/29/22 1049 2 g at 06/29/22 1049   cyclobenzaprine (FLEXERIL) tablet 10 mg  10 mg Oral BID PRN Karmen Bongo, MD       docusate sodium (COLACE) capsule 100 mg  100 mg Oral BID Karmen Bongo, MD   100 mg at 06/29/22 0962   doxazosin (CARDURA) tablet 2 mg  2 mg Oral Daily Karmen Bongo, MD   2 mg at 06/29/22 8366   ezetimibe (ZETIA) tablet 10 mg  10 mg Oral Daily Karmen Bongo, MD   10 mg at 06/29/22 0953   feeding supplement (ENSURE ENLIVE / ENSURE PLUS) liquid 237 mL  237 mL Oral TID BM Mariel Aloe, MD   237 mL at 06/29/22 1414   gabapentin (NEURONTIN) capsule 100 mg  100 mg Oral Ivery Quale, MD   100 mg at 06/28/22 2146   hydrALAZINE (APRESOLINE) injection 5 mg  5 mg Intravenous Q4H PRN Karmen Bongo, MD       insulin aspart (novoLOG) injection 0-15 Units  0-15 Units Subcutaneous TID WC Karmen Bongo, MD   2 Units at 06/27/22 1800   insulin aspart (novoLOG) injection 0-5 Units  0-5 Units Subcutaneous QHS Karmen Bongo, MD       insulin detemir (LEVEMIR) injection 25 Units  25 Units Subcutaneous Daily Karmen Bongo, MD   25 Units at 06/29/22 2947   isosorbide mononitrate (IMDUR) 24 hr tablet 30 mg  30 mg Oral Daily Karmen Bongo, MD   30 mg at 06/29/22 6546   lactated ringers infusion   Intravenous Continuous Karmen Bongo, MD 75 mL/hr at 06/28/22 2148 New Bag at 06/28/22 2148   lidocaine (XYLOCAINE) 2 % jelly 1 Application  1 Application Urethral Once PRN  Mariel Aloe, MD       metoprolol succinate (TOPROL-XL) 24 hr tablet 100 mg  100 mg Oral Q supper Karmen Bongo, MD   100 mg at 06/28/22 1712   morphine (PF) 2 MG/ML injection 2 mg  2 mg Intravenous Q2H PRN Karmen Bongo, MD       multivitamin with minerals tablet 1 tablet  1 tablet  Oral Daily Mariel Aloe, MD   1 tablet at 06/29/22 0953   ondansetron [facility]) tablet 4 mg  4 mg Oral Q6H PRN Karmen Bongo, MD       Or   ondansetron [facility]) injection 4 mg  4 mg Intravenous Q6H PRN Karmen Bongo, MD       oxyCODONE (Oxy IR/ROXICODONE) immediate release tablet 5 mg  5 mg Oral Q4H PRN Karmen Bongo, MD       polyethylene glycol (MIRALAX / GLYCOLAX) packet 17 g  17 g Oral Daily PRN Karmen Bongo, MD       sodium chloride flush (NS) 0.9 % injection 3 mL  3 mL Intravenous Q12H Karmen Bongo, MD   3 mL at 06/29/22 0955   sorbitol, milk of mag, mineral oil, glycerin (SMOG) enema  960 mL Rectal Once Mariel Aloe, MD        REVIEW OF SYSTEMS:  [X]  denotes positive finding, [ ]  denotes negative finding Cardiac  Comments:  Chest pain or chest pressure:    Shortness of breath upon exertion:    Short of breath when lying flat:    Irregular heart rhythm:        Vascular    Pain in calf, thigh, or hip brought on by ambulation:    Pain in feet at night that wakes you up from your sleep:     Blood clot in your veins:    Leg swelling:         Pulmonary    Oxygen at home:    Productive cough:     Wheezing:         Neurologic    Sudden weakness in arms or legs:     Sudden numbness in arms or legs:     Sudden onset of difficulty speaking or slurred speech:    Temporary loss of vision in one eye:     Problems with dizziness:         Gastrointestinal    Blood in stool:     Vomited blood:         Genitourinary    Burning when urinating:     Blood in urine:        Psychiatric    Major depression:         Hematologic    Bleeding problems:    Problems with blood clotting  too easily:        Skin    Rashes or ulcers: x       Constitutional    Fever or chills:    -  PHYSICAL EXAM:   Vitals:   06/28/22 0844 06/28/22 1634 06/28/22 2141 06/29/22 0606  BP: (!) 159/73 (!) 162/66 (!) 145/48 (!) 142/45  Pulse: 70 78 67 (!) 59  Resp: 18 18 19 18   Temp: 98.5 F (36.9 C) 99.1 F (37.3 C) 98.6 F (37 C) 98.5 F (36.9 C)  TempSrc: Oral Oral    SpO2: 94% 97% 99% 95%  Weight:      Height:       Body mass index is 28.58 kg/m. GENERAL: The patient is a well-nourished male, in no acute distress. The vital signs are documented above. CARDIAC: There is a regular rate and rhythm.  VASCULAR: I do not detect carotid bruits. He has palpable femoral pulses bilaterally. I cannot palpate popliteal or pedal pulses. He has a monophasic dorsalis pedis and peroneal signal bilaterally.  I cannot get posterior tibial signals. PULMONARY: There is good  air exchange bilaterally without wheezing or rales. ABDOMEN: Soft and non-tender with normal pitched bowel sounds.  MUSCULOSKELETAL: There are no major deformities. NEUROLOGIC: No focal weakness or paresthesias are detected. SKIN: He has a wound on the plantar aspect of his left foot over the fifth metatarsal head as documented in the photograph below.   PSYCHIATRIC: The patient has a normal affect.  DATA:    ARTERIOGRAM: I reviewed his arteriogram from October 2019.  At that time on the left side, which was the only side studied, the popliteal artery was occluded at the knee with severe tibial artery occlusive disease and no named vessels distally.  Based on these findings there were no options for revascularization on the left.  LABS: I reviewed his labs from 06/28/2022.  His creatinine was 1.5.  GFR was 46.  MRI: His MRI on 06/27/2022 shows a soft tissue ulcer on the lateral aspect of the foot with adjacent osteomyelitis of the fifth metatarsal head and neck.  There is no evidence of soft tissue abscess.  ABI's:  Pending  [name] [facility]

## 2022-06-29 NOTE — Progress Notes (Signed)
ANTICOAGULATION CONSULT NOTE - Initial Consult  Pharmacy Consult for heparin Indication: atrial fibrillation  Allergies  Allergen Reactions   Januvia [Sitagliptin] Other (See Comments)    Soreness to stomach area. Intolerance    Statins Other (See Comments)    Myalgias, muscle weakness, swelling, pain    Patient Measurements: Height: 5\' 9"  (175.3 cm) Weight: 87.8 kg (193 lb 9 oz) IBW/kg (Calculated) : 70.7 Heparin Dosing Weight: 87kg  Vital Signs: Temp: 98.5 F (36.9 C) (09/05 0606) BP: 130/62 (09/05 1622) Pulse Rate: 70 (09/05 1622)  Labs: Recent Labs    06/27/22 0444 06/28/22 0307  HGB 8.6* 8.2*  HCT 23.9* 23.1*  PLT 101* 90*  CREATININE 1.69* 1.51*    Estimated Creatinine Clearance: 40.6 mL/min (A) (by C-G formula based on SCr of 1.51 mg/dL (H)).   Medical History: Past Medical History:  Diagnosis Date   CAD (coronary artery disease)    a. s/p CABG x 29 Nov 2011 with LIMA to LAD, SVG to OM1 and distal LCX, SVG to ramus intermediate   Essential hypertension    Hyperlipidemia    Intermittent claudication ([place])    Ischemic cardiomyopathy    a. 03/2015 Echo: EF45-50%, Gr1 DD, mild MR.   PAF (paroxysmal atrial fibrillation) ([place])    a. post op atrial fib 11/2011; short course of amiodarone; stopped 01/25/12   Peripheral arterial disease ([place])    Right bundle branch block    Type II diabetes mellitus (HCC)     Medications:  Medications Prior to Admission  Medication Sig Dispense Refill Last Dose   acetaminophen (TYLENOL) 500 MG tablet Take 500 mg by mouth every 6 (six) hours as needed for moderate pain.   Past Week   Cholecalciferol (VITAMIN D3) 50 MCG (2000 UT) capsule Take 2,000 Units by mouth daily.   06/24/2022   doxazosin (CARDURA) 2 MG tablet Take 2 mg by mouth daily.   06/24/2022   ELIQUIS 2.5 MG TABS tablet Take 2.5 mg by mouth 2 (two) times daily.   06/24/2022 at 0700   insulin detemir (LEVEMIR) 100 UNIT/ML injection Inject 0.18 mLs (18 Units total) into  the skin daily. (Patient taking differently: Inject 25 Units into the skin daily.) 10 mL 2 06/24/2022   isosorbide mononitrate (IMDUR) 30 MG 24 hr tablet TAKE 1 TABLET DAILY (NEED OFFICE VISIT FOR REFILLS - 1ST ATTEMPT) (Patient taking differently: Take 30 mg by mouth daily.) 15 tablet 0 06/24/2022   metFORMIN (GLUCOPHAGE) 1000 MG tablet Take 1,000 mg by mouth daily.   Past Week   metoprolol succinate (TOPROL-XL) 100 MG 24 hr tablet Take 1 tablet (100 mg total) by mouth daily. Take with or immediately following a meal. (Patient taking differently: Take 100 mg by mouth daily with supper. Take with or immediately following a meal.) 30 tablet 3 Past Week   silver sulfADIAZINE (SILVADENE) 1 % cream Apply 1 application  topically daily. 50 g 0 Past Month   vitamin B-12 (CYANOCOBALAMIN) 500 MCG tablet Take 500 mcg by mouth daily with breakfast.    06/24/2022   blood glucose meter kit and supplies Dispense based on patient and insurance preference. Use up to four times daily as directed. (FOR ICD-10 E10.9, E11.9). 1 each 1    cyclobenzaprine (FLEXERIL) 10 MG tablet Take 1 tablet (10 mg total) by mouth 2 (two) times daily as needed for muscle spasms. (Patient not taking: Reported on 06/25/2022) 20 tablet 0 Completed Course   doxycycline (VIBRA-TABS) 100 MG tablet Take 1 tablet (100  mg total) by mouth 2 (two) times daily. (Patient not taking: Reported on 06/25/2022) 20 tablet 0 Completed Course   ezetimibe (ZETIA) 10 MG tablet Take 1 tablet (10 mg total) by mouth daily. (Patient not taking: Reported on 06/25/2022) 90 tablet 3 Not Taking   gabapentin (NEURONTIN) 100 MG capsule Take 1 capsule (100 mg total) by mouth at bedtime. (Patient not taking: Reported on 06/25/2022) 90 capsule 0 Not Taking   Insulin Syringe-Needle U-100 (INSULIN SYRINGE .3CC/31GX5/16") 31G X 5/16" 0.3 ML MISC Use with insulin 100 each 2    JANUVIA 50 MG tablet Take 50 mg by mouth daily. (Patient not taking: Reported on 01/26/2022)   Not Taking    Scheduled:   docusate sodium  100 mg Oral BID   doxazosin  2 mg Oral Daily   ezetimibe  10 mg Oral Daily   feeding supplement  237 mL Oral TID BM   gabapentin  100 mg Oral QHS   insulin aspart  0-15 Units Subcutaneous TID WC   insulin aspart  0-5 Units Subcutaneous QHS   insulin detemir  25 Units Subcutaneous Daily   isosorbide mononitrate  30 mg Oral Daily   metoprolol succinate  100 mg Oral Q supper   multivitamin with minerals  1 tablet Oral Daily   sodium chloride flush  3 mL Intravenous Q12H   sorbitol, milk of mag, mineral oil, glycerin (SMOG) enema  960 mL Rectal Once   Infusions:   ceFEPime (MAXIPIME) IV 2 g (06/29/22 1049)   heparin     lactated ringers 75 mL/hr at 06/28/22 2148    Assessment: Pt has been on apixaban for his AF. He will need to be off for 48hrs for arteriography and possible revascularization. We will bridge with heparin until then. We will use PTT for now until correlating with heparin level.   Scr 1.51 Hgb 8.2 Plt 90k  Goal of Therapy:  Heparin level 0.3-0.7 units/ml APTT: 66-102 Monitor platelets by anticoagulation protocol: Yes   Plan:  Baseline PTT/HL Heparin 1750 units/hr start at 10 PM Check 8 hr PTT/HL Daily PTT/HL/CBC  Onnie Boer, PharmD, BCIDP, AAHIVP, CPP Infectious Disease Pharmacist 06/29/2022 5:11 PM

## 2022-06-29 NOTE — Progress Notes (Signed)
Pharmacy Antibiotic Note  Steve [name]. is a 81 y.o. male admitted on 06/25/2022 with sepsis.  Pharmacy has been consulted for Cefepime  dosing.  Plan: Continue Cefepime 2g IV q12h Monitor for deescalation   Height: 5\' 9"  (175.3 cm) Weight: 87.8 kg (193 lb 9 oz) IBW/kg (Calculated) : 70.7  Temp (24hrs), Avg:98.7 F (37.1 C), Min:98.5 F (36.9 C), Max:99.1 F (37.3 C)  Recent Labs  Lab 06/25/22 1550 06/25/22 1640 06/25/22 2038 06/26/22 0513 06/27/22 0444 06/28/22 0307  WBC 9.6  --   --  6.9 5.0 5.3  CREATININE 2.47*  --   --  1.91* 1.69* 1.51*  LATICACIDVEN  --  2.6* 1.2  --   --   --      Estimated Creatinine Clearance: 40.6 mL/min (A) (by C-G formula based on SCr of 1.51 mg/dL (H)).    Allergies  Allergen Reactions   Januvia [Sitagliptin] Other (See Comments)    Soreness to stomach area. Intolerance    Statins Other (See Comments)    Myalgias, muscle weakness, swelling, pain    Antimicrobials this admission: Cefepime 9/1 >>  Metronidazole 9/1 >> 9/4 Vancomycin 9/1 >> 9/2  Dose adjustments this admission: N/A  Microbiology results: 9/1 BCx: NGTD 9/1 UCx: NGTD   Steve [name] A. Levada Dy, PharmD, BCPS, FNKF Clinical Pharmacist [place] Please utilize Amion for appropriate phone number to reach the unit pharmacist ([place])  06/29/2022 8:58 AM

## 2022-06-29 NOTE — Progress Notes (Addendum)
PROGRESS NOTE    Steve [name].  MIW:[id number] DOB: [birth year] DOA: 06/25/2022 PCP: System, Provider Not In   Brief Narrative: Steve [name]. is a 81 y.o. male with a history of CAD s/p CABG, hypertension, hyperlipidemia, diabetes mellitus type 2, PAD, atrial fibrillation. Patient presented secondary to tremors and was found to have fevers with concern for possible infection. SIRS criteria met without source identified. Patient started empirically on Vancomycin, Cefepime and Flagyl. Blood and urine cultures obtained. MRI significant for osteomyelitis. Podiatry and vascular surgery consulted.   Assessment and Plan:  Sepsis Present on admission. Source is cellulitis. Chronic ulcer which could also be source as patient has confirmed osteomyelitis. Blood and urine cultures are no growth. Empiric Vancomycin, Cefepime and Flagyl initiated on admission and patient has been deescalated to Cefepime. -Continue Cefepime  AKI on CKD stage IIIa Patient's baseline creatinine appears to be around 1.2. Creatinine of 2.47 on admission. 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 Patient followed with podiatry previously. Confirmed osteomyelitis of the fifth metatarsal head/neck in addition to early osteomyelitis of the fifth digit proximal phalanx on MRI from 9/3. -Consult podiatry for evaluation/management: Vascular surgery consult -ABIs  Hyponatremia Sodium of 126 on admission. Likely related to dehydration. Patient started on IV fluids. Sodium up to 130 today. -BMP in AM  Foot ulcer Cellulitis Patient with a chronic ulcer. Complicated by diabetes. Foot x-ray without evidence of osteomyelitis. Cellulitis significantly improved with antibiotics.  Acute urinary retention Unsure of etiology. Patient appears to be asymptomatic. Urinalysis is not suggestive of infection. Patient with continued urinary  retention -Foley catheter -Outpatient urology follow-up  Constipation Some improvement with bowel regimen.  Diabetes mellitus type 2 Patient is on Januvia, Levemir and metformin as an outpatient. Most recent hemoglobin A1C is 7.9%. -Continue Levemir and SSI  CAD Hyperlipidemia PAD No chest pain. Stable. -Continue Zetia  Primary hypertension -Continue Imdur and Toprol XL  Atrial fibrillation -Continue Eliquis and Toprol XL  Thrombocytopenia No current bleeding. Somewhat stable.   DVT prophylaxis: Eliquis Code Status:   Code Status: Full Code Family Communication: None at bedside Disposition Plan: Discharge to SNF pending transition to oral antibiotics as needed, continued infectious workup, improvement of urinary retention and podiatry/vascular surgery recommendations   Consultants:  Podiatry Vascular surgery  Procedures:  None  Antimicrobials: Vancomycin Cefepime Flagyl    Subjective: Patient without concerns or issues. Bowel movement yesterday.  Objective: BP (!) 142/45 (BP Location: Left Arm)   Pulse (!) 59   Temp 98.5 F (36.9 C)   Resp 18   Ht 5' 9"  (1.753 m)   Wt 87.8 kg   SpO2 95%   BMI 28.58 kg/m   Examination:  General exam: Appears calm and comfortable Respiratory system: Clear to auscultation. Respiratory effort normal. Cardiovascular system: S1 & S2 heard, RRR. Trace left DP pulses Gastrointestinal system: Abdomen is nondistended, soft and nontender. Normal bowel sounds heard. Central nervous system: Alert and oriented. No focal neurological deficits. Musculoskeletal: No edema. No calf tenderness Psychiatry: Judgement and insight appear normal. Mood & affect appropriate.    Data Reviewed: I have personally reviewed following labs and imaging studies  CBC Lab Results  Component Value Date   WBC 5.3 06/28/2022   RBC 2.66 (L) 06/28/2022   HGB 8.2 (L) 06/28/2022   HCT 23.1 (L) 06/28/2022   MCV 86.8 06/28/2022   MCH 30.8  06/28/2022  PLT 90 (L) 06/28/2022   MCHC 35.5 06/28/2022   RDW 14.4 06/28/2022   LYMPHSABS 0.3 (L) 06/25/2022   MONOABS 0.2 06/25/2022   EOSABS 0.1 06/25/2022   BASOSABS 0.0 25/85/2778     Last metabolic panel Lab Results  Component Value Date   NA 133 (L) 06/28/2022   K 3.5 06/28/2022   CL 108 06/28/2022   CO2 16 (L) 06/28/2022   BUN 20 06/28/2022   CREATININE 1.51 (H) 06/28/2022   GLUCOSE 95 06/28/2022   GFRNONAA 46 (L) 06/28/2022   GFRAA 46 (L) 05/19/2020   CALCIUM 8.4 (L) 06/28/2022   PHOS 2.8 06/25/2022   PROT 6.7 06/25/2022   ALBUMIN 3.3 (L) 06/25/2022   BILITOT 0.6 06/25/2022   ALKPHOS 50 06/25/2022   AST 24 06/25/2022   ALT 15 06/25/2022   ANIONGAP 9 06/28/2022    GFR: Estimated Creatinine Clearance: 40.6 mL/min (A) (by C-G formula based on SCr of 1.51 mg/dL (H)).  Recent Results (from the past 240 hour(s))  SARS Coronavirus 2 by RT PCR (hospital order, performed in [facility] hospital lab) *cepheid single result test* Anterior Nasal Swab     Status: None   Collection Time: 06/25/22  4:37 PM   Specimen: Anterior Nasal Swab  Result Value Ref Range Status   SARS Coronavirus 2 by RT PCR NEGATIVE NEGATIVE Final    Comment: (NOTE) SARS-CoV-2 target nucleic acids are NOT DETECTED.  The SARS-CoV-2 RNA is generally detectable in upper and lower respiratory specimens during the acute phase of infection. The lowest concentration of SARS-CoV-2 viral copies this assay can detect is 250 copies / mL. A negative result does not preclude SARS-CoV-2 infection and should not be used as the sole basis for treatment or other patient management decisions.  A negative result may occur with improper specimen collection / handling, submission of specimen other than nasopharyngeal swab, presence of viral mutation(s) within the areas targeted by this assay, and inadequate number of viral copies (<250 copies / mL). A negative result must be combined with clinical observations,  patient history, and epidemiological information.  Fact Sheet for Patients:   https://www.patel.info/  Fact Sheet for Healthcare Providers: https://hall.com/  This test is not yet approved or  cleared by the [place] FDA and has been authorized for detection and/or diagnosis of SARS-CoV-2 by FDA under an Emergency Use Authorization (EUA).  This EUA will remain in effect (meaning this test can be used) for the duration of the COVID-19 declaration under Section 564(b)(1) of the Act, 21 U.S.C. section 360bbb-3(b)(1), unless the authorization is terminated or revoked sooner.  Performed at [facility], [place] [address], [place], [place] [address]   Culture, blood (x 2)     Status: None (Preliminary result)   Collection Time: 06/25/22  8:37 PM   Specimen: BLOOD  Result Value Ref Range Status   Specimen Description BLOOD RIGHT ANTECUBITAL  Final   Special Requests   Final    BOTTLES DRAWN AEROBIC AND ANAEROBIC Blood Culture adequate volume   Culture   Final    NO GROWTH 4 DAYS Performed at [facility], [place] [address] [place], [place] [address]    Report Status PENDING  Incomplete  Culture, blood (x 2)     Status: None (Preliminary result)   Collection Time: 06/25/22  8:38 PM   Specimen: BLOOD  Result Value Ref Range Status   Specimen Description BLOOD RIGHT ANTECUBITAL  Final   Special Requests   Final  BOTTLES DRAWN AEROBIC AND ANAEROBIC Blood Culture adequate volume   Culture   Final    NO GROWTH 4 DAYS Performed at [facility], [place] [address] [place], [place] [address]    Report Status PENDING  Incomplete  Urine Culture     Status: None   Collection Time: 06/26/22  2:13 AM   Specimen: Urine, Clean Catch  Result Value Ref Range Status   Specimen Description URINE, CLEAN CATCH  Final   Special Requests NONE  Final   Culture   Final    NO GROWTH Performed at [facility], [place] [address],  [place], [place] [address]    Report Status 06/27/2022 FINAL  Final      Radiology Studies: No results found.    LOS: 4 days    Cordelia Poche, MD [facility] 06/29/2022, 3:06 PM   If 7PM-7AM, please contact night-coverage [email or web address]

## 2022-06-30 ENCOUNTER — Inpatient Hospital Stay (HOSPITAL_COMMUNITY): Payer: Medicare Other

## 2022-06-30 DIAGNOSIS — A419 Sepsis, unspecified organism: Secondary | ICD-10-CM | POA: Diagnosis not present

## 2022-06-30 DIAGNOSIS — M86672 Other chronic osteomyelitis, left ankle and foot: Secondary | ICD-10-CM

## 2022-06-30 DIAGNOSIS — M86179 Other acute osteomyelitis, unspecified ankle and foot: Secondary | ICD-10-CM

## 2022-06-30 DIAGNOSIS — I739 Peripheral vascular disease, unspecified: Secondary | ICD-10-CM

## 2022-06-30 LAB — CBC
HCT: 26.8 % — ABNORMAL LOW (ref 39.0–52.0)
Hemoglobin: 9.6 g/dL — ABNORMAL LOW (ref 13.0–17.0)
MCH: 31.6 pg (ref 26.0–34.0)
MCHC: 35.8 g/dL (ref 30.0–36.0)
MCV: 88.2 fL (ref 80.0–100.0)
Platelets: 95 10*3/uL — ABNORMAL LOW (ref 150–400)
RBC: 3.04 MIL/uL — ABNORMAL LOW (ref 4.22–5.81)
RDW: 14 % (ref 11.5–15.5)
WBC: 7.9 10*3/uL (ref 4.0–10.5)
nRBC: 0 % (ref 0.0–0.2)

## 2022-06-30 LAB — GLUCOSE, CAPILLARY
Glucose-Capillary: 98 mg/dL (ref 70–99)
Glucose-Capillary: 115 mg/dL — ABNORMAL HIGH (ref 70–99)
Glucose-Capillary: 138 mg/dL — ABNORMAL HIGH (ref 70–99)
Glucose-Capillary: 127 mg/dL — ABNORMAL HIGH (ref 70–99)
Glucose-Capillary: 116 mg/dL — ABNORMAL HIGH (ref 70–99)

## 2022-06-30 LAB — APTT: aPTT: 66 seconds — ABNORMAL HIGH (ref 24–36)

## 2022-06-30 LAB — CULTURE, BLOOD (ROUTINE X 2)
Culture: NO GROWTH
Culture: NO GROWTH
Special Requests: ADEQUATE
Special Requests: ADEQUATE

## 2022-06-30 MED ORDER — HEPARIN (PORCINE) 25000 UT/250ML-% IV SOLN
1750.0000 [IU]/h | INTRAVENOUS | Status: AC
  Administered 2022-06-30 – 2022-07-01 (×3): 1750 [IU]/h via INTRAVENOUS
  Filled 2022-06-30 (×3): qty 250

## 2022-06-30 MED ORDER — LORAZEPAM 0.5 MG PO TABS
0.5000 mg | ORAL_TABLET | Freq: Four times a day (QID) | ORAL | Status: DC | PRN
  Administered 2022-07-01: 0.5 mg via ORAL
  Filled 2022-06-30: qty 1

## 2022-06-30 MED ORDER — LORAZEPAM 2 MG/ML IJ SOLN
0.5000 mg | Freq: Four times a day (QID) | INTRAMUSCULAR | Status: DC | PRN
Start: 2022-06-30 — End: 2022-07-13

## 2022-06-30 MED ORDER — OXYCODONE HCL 5 MG PO TABS
5.0000 mg | ORAL_TABLET | ORAL | Status: DC | PRN
  Administered 2022-07-07: 10 mg via ORAL
  Filled 2022-06-30: qty 2

## 2022-06-30 MED ORDER — CHLORHEXIDINE GLUCONATE CLOTH 2 % EX PADS
6.0000 | MEDICATED_PAD | Freq: Every day | CUTANEOUS | Status: DC
  Administered 2022-06-30 – 2022-07-10 (×11): 6 via TOPICAL

## 2022-06-30 MED ORDER — CEFAZOLIN SODIUM-DEXTROSE 2-4 GM/100ML-% IV SOLN
2.0000 g | Freq: Three times a day (TID) | INTRAVENOUS | Status: DC
Start: 2022-06-30 — End: 2022-07-10
  Administered 2022-06-30 – 2022-07-10 (×31): 2 g via INTRAVENOUS
  Filled 2022-06-30 (×31): qty 100

## 2022-06-30 NOTE — Progress Notes (Signed)
Pt loss IV access and when IV team came, he gave nurse a hard time about getting a new IV. Pt was re educatedon importance of the new IV since he was receiving IV heparin now. Pt claim to  know nothing of getting IV heparin but was compliant, after much reassurance. Pt refused all PO night times meds. Said  he don't usually take any of these and the Doctor would have to inform him about it. Louanne Skye 06/30/22 12:05 AM

## 2022-06-30 NOTE — Progress Notes (Signed)
ABI completed. Refer to "CV Proc" under chart review to view preliminary results.  06/30/2022 10:54 AM Kelby Aline., MHA, RVT, RDCS, RDMS

## 2022-06-30 NOTE — Progress Notes (Signed)
   PODIATRY PROGRESS NOTE  NAME Steve [name]. MRN [id number] DOB 04-22-1941 DOA 06/25/2022   Reason for consult:  Chief Complaint  Patient presents with   Tremors     History of present illness: 81 y.o. male with left foot wound and likely osteomyelitis.  Vascular surgery been consulted.  He is scheduled for angio on Friday.  Currently denies any pain.  Dates he is feeling well.  Vitals:   06/30/22 0955 06/30/22 1644  BP: (!) 167/67 (!) 164/69  Pulse: 63 88  Resp: 18 18  Temp: 97.7 F (36.5 C) 98 F (36.7 C)  SpO2: 100% 97%     CBC    Component Value Date/Time   WBC 7.9 06/30/2022 1001   RBC 3.04 (L) 06/30/2022 1001   HGB 9.6 (L) 06/30/2022 1001   HGB 9.9 (L) 07/24/2018 1423   HCT 26.8 (L) 06/30/2022 1001   PLT 95 (L) 06/30/2022 1001   PLT 128 (L) 07/24/2018 1423   MCV 88.2 06/30/2022 1001   MCH 31.6 06/30/2022 1001   MCHC 35.8 06/30/2022 1001   RDW 14.0 06/30/2022 1001   LYMPHSABS 0.3 (L) 06/25/2022 1550   MONOABS 0.2 06/25/2022 1550   EOSABS 0.1 06/25/2022 1550   BASOSABS 0.0 06/25/2022 1550        Latest Ref Rng & Units 06/28/2022    3:07 AM 06/27/2022    4:44 AM 06/26/2022    5:13 AM  BMP  Glucose 70 - 99 mg/dL 95  110  177   BUN 8 - 23 mg/dL 20  23  23    Creatinine 0.61 - 1.24 mg/dL 1.51  1.69  1.91   Sodium 135 - 145 mmol/L 133  130  128   Potassium 3.5 - 5.1 mmol/L 3.5  3.6  4.0   Chloride 98 - 111 mmol/L 108  105  103   CO2 22 - 32 mmol/L 16  15  14    Calcium 8.9 - 10.3 mg/dL 8.4  8.1  8.0        Physical Exam: General: AAOx3, NAD  Dermatology: Wound appears to be unchanged compared to yesterday.  Wound is dry.  Ulceration present submetatarsal 5 left foot without any probing to bone.  There is minimal edema.  There is no frank purulence noted.  No significant erythema.  No ascending cellulitis.  Vascular: Pulses decreased  Neurological: Sensation decreased  Musculoskeletal Exam: No pain on exam    ASSESSMENT/PLAN OF CARE  Left foot  ulceration, osteomyelitis, PAD  -Plan for angio on Friday with vascular surgery.  If not able to improve circulation may need to proceed with proximal amputation if the wound/infection worsens.  States that he is open to this if needed but not yet ready to proceed with this. -Continue antibiotics for now -Podiatry will continue to follow.    Please contact me directly with any questions or concerns.     Celesta Gentile, DPM [facility]  Dr. Bonna Gains. [address], [address] [place], [place] [address]                Office [phone]  Fax [phone]

## 2022-06-30 NOTE — Progress Notes (Addendum)
  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  I will leave him on the schedule Friday for an arteriogram if he elects to proceed.  I have stopped his Eliquis and he is on a heparin bridge.  Of note, he did speak with his son and states that he might consider an amputation if the wound progresses and there are no options.  Gae Gallop, MD 7:55 AM   06/30/2022 7:23 AM Hospital Day 4  Subjective:  awake, resting in bed.  Appears confused this morning saying the hospital is a farce and there are no doctors here but says he is ok with Dr.  Scot Dock doing procedure for checking his blood flow.  Wants the catheter out.   afebrile  Vitals:   06/29/22 1622 06/29/22 2109  BP: 130/62 (!) 142/72  Pulse: 70 69  Resp:  18  Temp:  97.9 F (36.6 C)  SpO2:  98%    Physical Exam: General:  no distress; appears confused Lungs:  non labored   CBC    Component Value Date/Time   WBC 5.3 06/28/2022 0307   RBC 2.66 (L) 06/28/2022 0307   HGB 8.2 (L) 06/28/2022 0307   HGB 9.9 (L) 07/24/2018 1423   HCT 23.1 (L) 06/28/2022 0307   PLT 90 (L) 06/28/2022 0307   PLT 128 (L) 07/24/2018 1423   MCV 86.8 06/28/2022 0307   MCH 30.8 06/28/2022 0307   MCHC 35.5 06/28/2022 0307   RDW 14.4 06/28/2022 0307   LYMPHSABS 0.3 (L) 06/25/2022 1550   MONOABS 0.2 06/25/2022 1550   EOSABS 0.1 06/25/2022 1550   BASOSABS 0.0 06/25/2022 1550    BMET    Component Value Date/Time   NA 133 (L) 06/28/2022 0307   K 3.5 06/28/2022 0307   CL 108 06/28/2022 0307   CO2 16 (L) 06/28/2022 0307   GLUCOSE 95 06/28/2022 0307   BUN 20 06/28/2022 0307   CREATININE 1.51 (H) 06/28/2022 0307   CALCIUM 8.4 (L) 06/28/2022 0307   GFRNONAA 46 (L) 06/28/2022 0307   GFRAA 46 (L) 05/19/2020 1311    INR    Component Value Date/Time   INR 1.06 12/17/2018 1157      Intake/Output Summary (Last 24 hours) at 06/30/2022 0723 Last data filed at 06/30/2022 0529 Gross per 24 hour  Intake 783 ml  Output 2400 ml  Net -1617 ml     Assessment/Plan:  81 y.o. male with osteomyelitis left foot  Hospital Day 4  -pt for angiogram on Friday with Dr. Scot Dock to evaluate LLE.  Pt states he is ok to proceed with this.  -on abx for osteomyelitis -continue to hold Eliquis -will check back tomorrow   Leontine Locket, PA-C [facility] [phone] 06/30/2022 7:23 AM

## 2022-06-30 NOTE — Progress Notes (Signed)
Pharmacy Antibiotic Note  Steve [name]. is a 81 y.o. male admitted on 06/25/2022 with left foot wound with concerns for osteomyelitis. Pharmacy has been consulted for Cefazolin dosing.  Plan: Cefazolin 2g IV Q8h Trend WBC, fever, renal function F/u cultures, clinical progress, levels as indicated F/u surgical plans (tentatively scheduled for angio/amp Friday)    Height: 5\' 9"  (175.3 cm) Weight: 87.8 kg (193 lb 9 oz) IBW/kg (Calculated) : 70.7  Temp (24hrs), Avg:97.9 F (36.6 C), Min:97.9 F (36.6 C), Max:97.9 F (36.6 C)  Recent Labs  Lab 06/25/22 1550 06/25/22 1640 06/25/22 2038 06/26/22 0513 06/27/22 0444 06/28/22 0307  WBC 9.6  --   --  6.9 5.0 5.3  CREATININE 2.47*  --   --  1.91* 1.69* 1.51*  LATICACIDVEN  --  2.6* 1.2  --   --   --     Estimated Creatinine Clearance: 40.6 mL/min (A) (by C-G formula based on SCr of 1.51 mg/dL (H)).    Allergies  Allergen Reactions   Januvia [Sitagliptin] Other (See Comments)    Soreness to stomach area. Intolerance    Statins Other (See Comments)    Myalgias, muscle weakness, swelling, pain    Antimicrobials this admission: Cefepime 9/1 >> 9/05 Metronidazole 9/1 >> 9/4 Vancomycin 9/1 >> 9/2  Microbiology results: 9/1 BCx: NGTD 9/1 UCx: NGTD    Thank you for allowing pharmacy to be a part of this patient's care.  Ardyth Harps, PharmD Clinical Pharmacist

## 2022-06-30 NOTE — Progress Notes (Signed)
Progress Note Patient: Steve [name]. BEE:[id number] DOB: 01/19/1941 DOA: 06/25/2022  DOS: the patient was seen and examined on 06/30/2022  Brief hospital course: Livingston Denner. is a 81 y.o. male with a history of CAD s/p CABG, hypertension, hyperlipidemia, diabetes mellitus type 2, PAD, atrial fibrillation. Patient presented secondary to tremors and was found to have fevers with concern for possible infection. SIRS criteria met without source identified. Patient started empirically on Vancomycin, Cefepime and Flagyl. Blood and urine cultures obtained. MRI significant for osteomyelitis. Podiatry and vascular surgery consulted. Assessment and Plan: Sepsis Present on admission. Source is cellulitis. Chronic ulcer which could also be source as patient has confirmed osteomyelitis. Blood and urine cultures are no growth. Empiric Vancomycin, Cefepime and Flagyl initiated on admission and patient has been deescalated to Cefepime.  As of 9/6 now on cefazolin. -Continue cefazolin   AKI on CKD stage IIIa Patient's baseline creatinine appears to be around 1.2. Creatinine of 2.47 on admission. Improved to 1.69 this morning on IV fluids Treated with IV fluids.  Monitor.   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 Patient followed with podiatry previously. Confirmed osteomyelitis of the fifth metatarsal head/neck in addition to early osteomyelitis of the fifth digit proximal phalanx on MRI from 9/3. -Consult podiatry for evaluation/management: Vascular surgery consult -ABIs   Hyponatremia Sodium of 126 on admission. Likely related to dehydration.  Improving. -BMP in AM   Foot ulcer Cellulitis Patient with a chronic ulcer. Complicated by diabetes. Foot x-ray without evidence of osteomyelitis. Cellulitis significantly improved with antibiotics.   Acute urinary retention Unsure of etiology. Patient appears to be asymptomatic. Urinalysis is not suggestive of infection. Patient with  continued urinary retention -Foley catheter -Outpatient urology follow-up   Constipation Some improvement with bowel regimen.   Diabetes mellitus type 2 uncontrolled with hyperglycemia with nonhealing wounds. Patient is on Januvia, Levemir and metformin as an outpatient. Most recent hemoglobin A1C is 7.9%. -Continue Levemir and SSI   CAD Hyperlipidemia PAD No chest pain. Stable. -Continue Zetia   Primary hypertension -Continue Imdur and Toprol XL   Atrial fibrillation -Continue Eliquis and Toprol XL   Thrombocytopenia No current bleeding. Somewhat stable.  Acute encephalopathy.  Mostly delirium Etiology not clear most likely combination of prolonged hospitalization and medication side effect.  Unable to differentiate further whether metabolic or toxic. Currently discontinue cefepime switch to cefazolin. Monitor. QTc is prolonged therefore unable to use antipsychotics. Use as needed Ativan.  Subjective: Somewhat confused this morning.  Later was severely agitated and pulled out IV.  As the day progressed becomes calmer.  No acute complaints or events.  Pain well controlled.  Physical Exam: Vitals:   06/29/22 2109 06/30/22 0928 06/30/22 0955 06/30/22 1644  BP: (!) 142/72 (!) 170/58 (!) 167/67 (!) 164/69  Pulse: 69 62 63 88  Resp: _0 Temp: 97.9 F (36.6 C)  97.7 F (36.5 C) 98 F (36.7 C)  TempSrc:   Oral Oral  SpO2: 98%  100% 97%  Weight:      Height:       General: Appear in mild distress; no visible Abnormal Neck Mass Or lumps, Conjunctiva normal Cardiovascular: S1 and S2 Present, no Murmur, Respiratory: good respiratory effort, Bilateral Air entry present and CTA, no Crackles, no wheezes Abdomen: Bowel Sound present, Non tender  Extremities: no Pedal edema left leg ulcer seen with redness on the dorsal aspect Neurology: alert and oriented to self, somewhat confused Gait not checked due to  patient safety concerns   Data Reviewed: I have Reviewed  nursing notes, Vitals, and Lab results since pt's last encounter. Pertinent lab results CBC and BMP I have ordered test including CBC and BMP I have independently visualized and interpreted EKG which showed EKG: normal sinus rhythm, nonspecific ST and T waves changes, prolonged QT interval.   Family Communication: No one at bedside.  Patient denied to contact his son.  He will contact himself.  Disposition: Status is: Inpatient Remains inpatient appropriate because: Need vascular procedure Author: Berle Mull, MD 06/30/2022 7:56 PM  Please look on [email or web address] to find out who is on call.

## 2022-06-30 NOTE — Progress Notes (Signed)
ANTICOAGULATION CONSULT NOTE  Pharmacy Consult for Heparin Indication: atrial fibrillation  Allergies  Allergen Reactions   Januvia [Sitagliptin] Other (See Comments)    Soreness to stomach area. Intolerance    Statins Other (See Comments)    Myalgias, muscle weakness, swelling, pain    Patient Measurements: Height: 5\' 9"  (175.3 cm) Weight: 87.8 kg (193 lb 9 oz) IBW/kg (Calculated) : 70.7  Heparin Dosing Weight: 87 kg  Vital Signs: Temp: 98.7 F (37.1 C) (09/06 2128) Temp Source: Oral (09/06 1644) BP: 157/74 (09/06 2128) Pulse Rate: 65 (09/06 2128)  Labs: Recent Labs    06/28/22 0307 06/29/22 1804 06/30/22 1001 06/30/22 2058  HGB 8.2*  --  9.6*  --   HCT 23.1*  --  26.8*  --   PLT 90*  --  95*  --   APTT  --  38*  --  66*  HEPARINUNFRC  --  >1.10*  --   --   CREATININE 1.51*  --   --   --      Estimated Creatinine Clearance: 40.6 mL/min (A) (by C-G formula based on SCr of 1.51 mg/dL (H)).   Assessment: 81 yo male admitted for left foot wound infection on apixaban PTA for afib. Apixaban now held for pending arteriography +/- revascularization on Friday. Pharmacy consulted to manage IV heparin while apixaban on hold.   Heparin level falsely elevated due to recent DOAC administration. Will use aPTT to guide heparin adjustments until correlating. Heparin infusion stopped 9/06 ~0800 due to patient removing all PIVs. Heparin now to restart, PIV access restored.   PTT came back therapeutic tonight at 66. Cont current rate and check in AM.  Goal of Therapy:  Heparin level 0.3-0.7 units/ml aPTT 66-102 seconds Monitor platelets by anticoagulation protocol: Yes   Plan:  Cont heparin infusion at 1750 units/hr Check heparin level/APTT in AM and daily while on heparin Continue to monitor H&H and platelets    Onnie Boer, PharmD, BCIDP, AAHIVP, CPP Infectious Disease Pharmacist 06/30/2022 10:02 PM

## 2022-06-30 NOTE — Progress Notes (Signed)
PT Cancellation Note  Patient Details Name: Steve [name]. MRN: [id number] DOB: 1941-06-27   Cancelled Treatment:    Reason Eval/Treat Not Completed: Patient at procedure or test/unavailable  Leaving for ABI per RN. Will attempt to see in pm if schedule permits.    [place]  Office [phone]   Rexanne Mano 06/30/2022, 10:13 AM

## 2022-06-30 NOTE — Progress Notes (Signed)
ANTICOAGULATION CONSULT NOTE  Pharmacy Consult for Heparin Indication: atrial fibrillation  Allergies  Allergen Reactions   Januvia [Sitagliptin] Other (See Comments)    Soreness to stomach area. Intolerance    Statins Other (See Comments)    Myalgias, muscle weakness, swelling, pain    Patient Measurements: Height: 5\' 9"  (175.3 cm) Weight: 87.8 kg (193 lb 9 oz) IBW/kg (Calculated) : 70.7  Heparin Dosing Weight: 87 kg  Vital Signs: Temp: 97.7 F (36.5 C) (09/06 0955) Temp Source: Oral (09/06 0955) BP: 167/67 (09/06 0955) Pulse Rate: 63 (09/06 0955)  Labs: Recent Labs    06/28/22 0307 06/29/22 1804 06/30/22 1001  HGB 8.2*  --  9.6*  HCT 23.1*  --  26.8*  PLT 90*  --  95*  APTT  --  38*  --   HEPARINUNFRC  --  >1.10*  --   CREATININE 1.51*  --   --     Estimated Creatinine Clearance: 40.6 mL/min (A) (by C-G formula based on SCr of 1.51 mg/dL (H)).   Assessment: 81 yo male admitted for left foot wound infection on apixaban PTA for afib. Apixaban now held for pending arteriography +/- revascularization on Friday. Pharmacy consulted to manage IV heparin while apixaban on hold.   Heparin level falsely elevated due to recent DOAC administration. Will use aPTT to guide heparin adjustments until correlating. Heparin infusion stopped 9/06 ~0800 due to patient removing all PIVs. Heparin now to restart, PIV access restored.   Goal of Therapy:  Heparin level 0.3-0.7 units/ml aPTT 66-102 seconds Monitor platelets by anticoagulation protocol: Yes   Plan:  Resume heparin infusion at 1750 units/hr Check heparin level in 8 hours and daily while on heparin Continue to monitor H&H and platelets    Thank you for allowing pharmacy to be a part of this patient's care.  Ardyth Harps, PharmD Clinical Pharmacist

## 2022-06-30 NOTE — Progress Notes (Signed)
This RN assessed patient to find all IVs have been removed by the patient.  Patient appears to be very confused. One IV had his heparin running, was found on floor still attached to IV pump.  Patient stated that he does not take heparin he takes eliquis and that the FBI is supposed to come and get him and a lot of other people this morning.  Is repeatedly stating that he is confused as to what is going on. Patient made an attempt to call "mom" on his phone and left a message stating that "this is dad and I need you to call me back to figure out what is going on.  This RN attempted to reorient patient to time place and situation.  Patient is agreeable to getting Ivs replaced to continue heparin infusion.  MD paged.  Aurther Loft, RN

## 2022-06-30 NOTE — Plan of Care (Signed)

## 2022-07-01 DIAGNOSIS — A419 Sepsis, unspecified organism: Secondary | ICD-10-CM | POA: Diagnosis not present

## 2022-07-01 LAB — CBC
HCT: 22.7 % — ABNORMAL LOW (ref 39.0–52.0)
Hemoglobin: 7.9 g/dL — ABNORMAL LOW (ref 13.0–17.0)
MCH: 31 pg (ref 26.0–34.0)
MCHC: 34.8 g/dL (ref 30.0–36.0)
MCV: 89 fL (ref 80.0–100.0)
Platelets: 101 10*3/uL — ABNORMAL LOW (ref 150–400)
RBC: 2.55 MIL/uL — ABNORMAL LOW (ref 4.22–5.81)
RDW: 14.2 % (ref 11.5–15.5)
WBC: 5.7 10*3/uL (ref 4.0–10.5)
nRBC: 0 % (ref 0.0–0.2)

## 2022-07-01 LAB — RENAL FUNCTION PANEL
Albumin: 2.3 g/dL — ABNORMAL LOW (ref 3.5–5.0)
Anion gap: 8 (ref 5–15)
BUN: 13 mg/dL (ref 8–23)
CO2: 22 mmol/L (ref 22–32)
Calcium: 8.5 mg/dL — ABNORMAL LOW (ref 8.9–10.3)
Chloride: 108 mmol/L (ref 98–111)
Creatinine, Ser: 1.38 mg/dL — ABNORMAL HIGH (ref 0.61–1.24)
GFR, Estimated: 51 mL/min — ABNORMAL LOW (ref >60)
Glucose, Bld: 126 mg/dL — ABNORMAL HIGH (ref 70–99)
Phosphorus: 4 mg/dL (ref 2.5–4.6)
Potassium: 2.8 mmol/L — ABNORMAL LOW (ref 3.5–5.1)
Sodium: 138 mmol/L (ref 135–145)

## 2022-07-01 LAB — APTT: aPTT: 78 seconds — ABNORMAL HIGH (ref 24–36)

## 2022-07-01 LAB — MAGNESIUM: Magnesium: 1.8 mg/dL (ref 1.7–2.4)

## 2022-07-01 LAB — HEPARIN LEVEL (UNFRACTIONATED): Heparin Unfractionated: 0.54 IU/mL (ref 0.30–0.70)

## 2022-07-01 LAB — GLUCOSE, CAPILLARY
Glucose-Capillary: 127 mg/dL — ABNORMAL HIGH (ref 70–99)
Glucose-Capillary: 118 mg/dL — ABNORMAL HIGH (ref 70–99)
Glucose-Capillary: 152 mg/dL — ABNORMAL HIGH (ref 70–99)
Glucose-Capillary: 171 mg/dL — ABNORMAL HIGH (ref 70–99)

## 2022-07-01 MED ORDER — DOXAZOSIN MESYLATE 2 MG PO TABS
2.0000 mg | ORAL_TABLET | Freq: Once | ORAL | Status: AC
Start: 2022-07-01 — End: 2022-07-01
  Administered 2022-07-01: 2 mg via ORAL
  Filled 2022-07-01: qty 1

## 2022-07-01 MED ORDER — POTASSIUM CHLORIDE CRYS ER 20 MEQ PO TBCR
40.0000 meq | EXTENDED_RELEASE_TABLET | ORAL | Status: AC
  Administered 2022-07-01 (×2): 40 meq via ORAL
  Filled 2022-07-01 (×2): qty 2

## 2022-07-01 MED ORDER — DOXAZOSIN MESYLATE 2 MG PO TABS
4.0000 mg | ORAL_TABLET | Freq: Every day | ORAL | Status: DC
  Administered 2022-07-02 – 2022-07-13 (×12): 4 mg via ORAL
  Filled 2022-07-01 (×13): qty 2

## 2022-07-01 MED ORDER — INSULIN DETEMIR 100 UNIT/ML ~~LOC~~ SOLN
15.0000 [IU] | Freq: Every day | SUBCUTANEOUS | Status: DC
Start: 2022-07-02 — End: 2022-07-13
  Administered 2022-07-03 – 2022-07-13 (×9): 15 [IU] via SUBCUTANEOUS
  Filled 2022-07-01 (×12): qty 0.15

## 2022-07-01 NOTE — Progress Notes (Signed)
Mobility Specialist Criteria Algorithm Info.   07/01/22 1233  Mobility  Activity Refused mobility;Contraindicated/medical hold   Pt initially declined all therapy services for today but agreed to participate in PT later with encouragement. Agreed with patient and physical therapist to hold mobility now, for max participation later. Will continue to follow.  Steve [name], [place], [place]  LAGTX:[phone] Office: [phone]

## 2022-07-01 NOTE — Progress Notes (Addendum)
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  We will make further recommendations pending these results.  He is on IV heparin as he has been off Eliquis since Wednesday.  I will stop this in the morning prior to his arteriogram.  I have discussed the indications for the procedure the potential complications with the patient and he is agreeable to proceed.  All of his questions were answered.  Gae Gallop, MD 8:22 AM   Subjective  - no new complaints   Objective (!) 141/51 63 97.9 F (36.6 C) (Oral) 20 100%  Intake/Output Summary (Last 24 hours) at 07/01/2022 0655 Last data filed at 07/01/2022 0600 Gross per 24 hour  Intake 3425.54 ml  Output 3300 ml  Net 125.54 ml   No acute distress Left foot dry dressing in place Lungs non labored breathing   Assessment/Planning: PAD with non healing left 5th MT plantar wound  Eliquis on hold, Heparin bridge  Pending angiogram Friday he agrees to proceed.   NPO past MN and consent placed  Roxy Horseman 07/01/2022 6:55 AM --  Laboratory Lab Results: Recent Labs    06/30/22 1001  WBC 7.9  HGB 9.6*  HCT 26.8*  PLT 95*   BMET No results for input(s): "NA", "K", "CL", "CO2", "GLUCOSE", "BUN", "CREATININE", "CALCIUM" in the last 72 hours.  COAG Lab Results  Component Value Date   INR 1.06 12/17/2018   INR 1.30 07/24/2018   INR 1.09 04/01/2015   No results found for: "PTT"

## 2022-07-01 NOTE — Progress Notes (Signed)
Physical Therapy Treatment Patient Details Name: Steve [name]. MRN: [id number] DOB: Jun 10, 1941 Today's Date: 07/01/2022   History of Present Illness 81 y.o. male presenting 06/25/22 with tremors. He reports that he felt bad today.  Some tremors; AKI, hyponatremia, sepsis of ?origin   PMH significant of CAD s/p CABG; HTN; HLD; DM; PAD; and afib    PT Comments    Patient agreeable to ambulation only (which was a victory!). Much more steady today compared to 9/5. 180 ft with RW and minguard assist.    Recommendations for follow up therapy are one component of a multi-disciplinary discharge planning process, led by the attending physician.  Recommendations may be updated based on patient status, additional functional criteria and insurance authorization.  Follow Up Recommendations  Home health PT     Assistance Recommended at Discharge PRN  Patient can return home with the following Assistance with cooking/housework;Assist for transportation;Help with stairs or ramp for entrance   Equipment Recommendations  None recommended by PT    Recommendations for Other Services       Precautions / Restrictions Precautions Precautions: Fall Restrictions Weight Bearing Restrictions: No     Mobility  Bed Mobility Overal bed mobility: Modified Independent             General bed mobility comments: HOB slightly elevated    Transfers Overall transfer level: Needs assistance Equipment used: Rolling walker (2 wheels) Transfers: Sit to/from Stand Sit to Stand: Min assist           General transfer comment: assist needed for boosting    Ambulation/Gait Ambulation/Gait assistance: Min guard Gait Distance (Feet): 180 Feet Assistive device: Rolling walker (2 wheels) Gait Pattern/deviations: Step-through pattern, Decreased stride length, Wide base of support   Gait velocity interpretation: >2.62 ft/sec, indicative of community ambulatory   General Gait Details: more steady than  last session; pt turns RW without assist   Stairs             Wheelchair Mobility    Modified [place] (Stroke Patients Only)       Balance Overall balance assessment: Needs assistance Sitting-balance support: No upper extremity supported, Feet supported Sitting balance-Leahy Scale: Good     Standing balance support: Bilateral upper extremity supported, Reliant on assistive device for balance Standing balance-Leahy Scale: Poor                              Cognition Arousal/Alertness: Awake/alert Behavior During Therapy: WFL for tasks assessed/performed Overall Cognitive Status: Within Functional Limits for tasks assessed                                          Exercises      General Comments        Pertinent Vitals/Pain Pain Assessment Pain Assessment: No/denies pain    Home Living                          Prior Function            PT Goals (current goals can now be found in the care plan section) Acute Rehab PT Goals Patient Stated Goal: return to [facility] living Time For Goal Achievement: 07/10/22 Potential to Achieve Goals: Good Progress towards PT goals: Progressing toward goals    Frequency  Min 3X/week      PT Plan Current plan remains appropriate    Co-evaluation              AM-PAC PT "6 Clicks" Mobility   Outcome Measure  Help needed turning from your back to your side while in a flat bed without using bedrails?: None Help needed moving from lying on your back to sitting on the side of a flat bed without using bedrails?: None Help needed moving to and from a bed to a chair (including a wheelchair)?: A Little Help needed standing up from a chair using your arms (e.g., wheelchair or bedside chair)?: A Little Help needed to walk in hospital room?: A Little Help needed climbing 3-5 steps with a railing? : A Lot 6 Click Score: 19    End of Session Equipment Utilized During  Treatment: Gait belt Activity Tolerance: Patient tolerated treatment well Patient left: in bed;with call bell/phone within reach;with bed alarm set Nurse Communication: Mobility status PT Visit Diagnosis: Difficulty in walking, not elsewhere classified (R26.2)     Time: 4158-3094 PT Time Calculation (min) (ACUTE ONLY): 13 min  Charges:  $Gait Training: 8-22 mins                      [place]  Office [phone]    Steve [name] 07/01/2022, 2:16 PM

## 2022-07-01 NOTE — Progress Notes (Signed)
ANTICOAGULATION CONSULT NOTE  Pharmacy Consult for Heparin Indication: atrial fibrillation  Allergies  Allergen Reactions   Januvia [Sitagliptin] Other (See Comments)    Soreness to stomach area. Intolerance    Statins Other (See Comments)    Myalgias, muscle weakness, swelling, pain    Patient Measurements: Height: 5\' 9"  (175.3 cm) Weight: 87.8 kg (193 lb 9 oz) IBW/kg (Calculated) : 70.7  Heparin Dosing Weight: 87 kg  Vital Signs: Temp: 97.9 F (36.6 C) (09/07 0559) Temp Source: Oral (09/07 0559) BP: 141/51 (09/07 0559) Pulse Rate: 63 (09/07 0559)  Labs: Recent Labs    06/29/22 1804 06/30/22 1001 06/30/22 2058 07/01/22 0620  HGB  --  9.6*  --   --   HCT  --  26.8*  --   --   PLT  --  95*  --   --   APTT 38*  --  66* 78*  HEPARINUNFRC >1.10*  --   --  0.54     Estimated Creatinine Clearance: 40.6 mL/min (A) (by C-G formula based on SCr of 1.51 mg/dL (H)).   Assessment: 81 yo male admitted for left foot wound infection on apixaban PTA for afib. Apixaban now held for pending arteriography +/- revascularization on Friday. Pharmacy consulted to manage IV heparin while apixaban on hold.   Heparin level falsely elevated due to recent DOAC administration. Will use aPTT to guide heparin adjustments until correlating. Heparin infusion stopped 9/06 ~0800 due to patient removing all PIVs. Heparin now to restart, PIV access restored.   PTT came back therapeutic this AM at 78. HL 0.54 (almost correlating) Cont current rate and check in AM.  Goal of Therapy:  Heparin level 0.3-0.7 units/ml aPTT 66-102 seconds Monitor platelets by anticoagulation protocol: Yes   Plan:  Cont heparin infusion at 1750 units/hr Check heparin level/APTT in AM and daily while on heparin Continue to monitor H&H and platelets    Davisha Linthicum A. Levada Dy, PharmD, BCPS, FNKF Clinical Pharmacist [place] Please utilize Amion for appropriate phone number to reach the unit pharmacist ([place])  07/01/2022 7:47 AM

## 2022-07-01 NOTE — Plan of Care (Signed)

## 2022-07-01 NOTE — Progress Notes (Signed)
Progress Note Patient: Steve [name]. WCB:[id number] DOB: [birth year] DOA: 06/25/2022  DOS: the patient was seen and examined on 07/01/2022  Brief hospital course: Steve [name]. is a 81 y.o. male with a history of CAD s/p CABG, hypertension, hyperlipidemia, diabetes mellitus type 2, PAD, atrial fibrillation. Patient presented secondary to tremors and was found to have fevers with concern for possible infection. SIRS criteria met without source identified. Patient started empirically on Vancomycin, Cefepime and Flagyl. Blood and urine cultures obtained. MRI significant for osteomyelitis. Podiatry and vascular surgery consulted. Assessment and Plan: Sepsis secondary to cellulitis, present on admission Chronic ulcer which could also be source as patient has confirmed osteomyelitis. Blood and urine cultures are no growth. Empiric Vancomycin, Cefepime and Flagyl initiated on admission and patient has been deescalated to Cefepime.  As of 9/6 now on cefazolin. -Continue cefazolin   AKI on CKD stage IIIa Patient's baseline creatinine appears to be around 1.2. Creatinine of 2.47 on admission. Improved to 1.69 this morning on IV fluids Treated with IV fluids.  Monitor.   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 Patient followed with podiatry previously. Confirmed osteomyelitis of the fifth metatarsal head/neck in addition to early osteomyelitis of the fifth digit proximal phalanx on MRI from 9/3. Podiatry and vascular surgery following the patient.   Hyponatremia Sodium of 126 on admission. Likely related to dehydration.  Improving. -BMP in AM   Foot ulcer Cellulitis Patient with a chronic ulcer. Complicated by diabetes. Foot x-ray without evidence of osteomyelitis. Cellulitis significantly improved with antibiotics.   Acute urinary retention Unsure of etiology. Patient appears to be asymptomatic. Urinalysis is not suggestive of infection. Patient with continued urinary  retention -Foley catheter -Outpatient urology follow-up   Constipation Some improvement with bowel regimen.   Diabetes mellitus type 2 uncontrolled with hyperglycemia with nonhealing wounds. Patient is on Januvia, Levemir and metformin as an outpatient. Most recent hemoglobin A1C is 7.9%. -Continue Levemir and SSI   CAD Hyperlipidemia PAD No chest pain. Stable. -Continue Zetia ABI significantly abnormal. Vascular surgery taking the patient for angiography on 9/8   Primary hypertension -Continue Imdur and Toprol XL   Atrial fibrillation -Patient was on Eliquis and Toprol XL Currently on IV heparin.   Thrombocytopenia No current bleeding. Somewhat stable.  Acute encephalopathy.  Mostly delirium Etiology not clear most likely combination of prolonged hospitalization and medication side effect.  Unable to differentiate further whether metabolic or toxic. Currently discontinue cefepime switch to cefazolin. Monitor. QTc is prolonged therefore unable to use antipsychotics. Use as needed Ativan.  Subjective: Mentation improved.  Cooperative.  No nausea or vomiting.  No active bleeding.  Pain well controlled.  Physical Exam: Vitals:   07/01/22 1642 07/01/22 1701 07/01/22 1701 07/01/22 1702  BP: (!) 143/46 (!) 155/65 (!) 155/65   Pulse: 73  69 73  Resp: 18     Temp: 98.2 F (36.8 C)     TempSrc:      SpO2: 98%  97%   Weight:      Height:       General: Appear in mild distress; no visible Abnormal Neck Mass Or lumps, Conjunctiva normal Cardiovascular: S1 and S2 Present, aortic systolic Murmur, Respiratory: good respiratory effort, Bilateral Air entry present and CTA, no Crackles, no wheezes Abdomen: Bowel Sound present, Non tender Extremities: no Pedal edema Neurology: alert and oriented to Self, Place and time.   Data Reviewed: I have Reviewed nursing notes, Vitals, and Lab results since pt's last encounter.  Pertinent lab results CBC and BMP I have ordered test  including CBC and BMP    Family Communication: No one at bedside.   Disposition: Status is: Inpatient Remains inpatient appropriate because: Needs angiography most likely will require stenting.  Author: Berle Mull, MD 07/01/2022 7:03 PM  Please look on [email or web address] to find out who is on call.

## 2022-07-01 NOTE — Progress Notes (Signed)
Occupational Therapy Treatment Patient Details Name: Steve [name]. MRN: [id number] DOB: 1941-08-07 Today's Date: 07/01/2022   History of present illness 81 y.o. male presenting 06/25/22 with tremors. He reports that he felt bad today.  Some tremors; AKI, hyponatremia, sepsis of ?origin   PMH significant of CAD s/p CABG; HTN; HLD; DM; PAD; and afib   OT comments  Min guard assist to ambulation with RW to bathroom and then to sink for one grooming activity. Declined full ADL.    Recommendations for follow up therapy are one component of a multi-disciplinary discharge planning process, led by the attending physician.  Recommendations may be updated based on patient status, additional functional criteria and insurance authorization.    Follow Up Recommendations  Home health OT    Assistance Recommended at Discharge Set up Supervision/Assistance  Patient can return home with the following  A little help with walking and/or transfers;A little help with bathing/dressing/bathroom;Direct supervision/assist for medications management;Direct supervision/assist for financial management;Help with stairs or ramp for entrance   Equipment Recommendations  None recommended by OT    Recommendations for Other Services      Precautions / Restrictions Precautions Precautions: Fall Restrictions Weight Bearing Restrictions: No       Mobility Bed Mobility Overal bed mobility: Modified Independent             General bed mobility comments: HOB slightly elevated    Transfers Overall transfer level: Needs assistance Equipment used: Rolling walker (2 wheels)   Sit to Stand: Min guard           General transfer comment: required 2 attempts, but able to stand with min guard assist on seconcd     Balance Overall balance assessment: Needs assistance Sitting-balance support: No upper extremity supported, Feet supported Sitting balance-Leahy Scale: Good     Standing balance support:  Bilateral upper extremity supported, Reliant on assistive device for balance Standing balance-Leahy Scale: Poor                             ADL either performed or assessed with clinical judgement   ADL Overall ADL's : Needs assistance/impaired     Grooming: Wash/dry hands;Sitting;Supervision/safety                   Toilet Transfer: Min guard;Ambulation   Toileting- Clothing Manipulation and Hygiene: Min guard;Sit to/from stand       Functional mobility during ADLs: Min guard;Rolling walker (2 wheels)      Extremity/Trunk Assessment              Vision       Perception     Praxis      Cognition Arousal/Alertness: Awake/alert Behavior During Therapy: WFL for tasks assessed/performed Overall Cognitive Status: Within Functional Limits for tasks assessed                                          Exercises      Shoulder Instructions       General Comments      Pertinent Vitals/ Pain       Pain Assessment Pain Assessment: No/denies pain  Home Living  Prior Functioning/Environment              Frequency  Min 2X/week        Progress Toward Goals  OT Goals(current goals can now be found in the care plan section)  Progress towards OT goals: Progressing toward goals  Acute Rehab OT Goals OT Goal Formulation: With patient Time For Goal Achievement: 07/10/22 Potential to Achieve Goals: Good  Plan Discharge plan remains appropriate    Co-evaluation                 AM-PAC OT "6 Clicks" Daily Activity     Outcome Measure   Help from another person eating meals?: None Help from another person taking care of personal grooming?: A Little Help from another person toileting, which includes using toliet, bedpan, or urinal?: A Little Help from another person bathing (including washing, rinsing, drying)?: A Little Help from another person to put on  and taking off regular upper body clothing?: None Help from another person to put on and taking off regular lower body clothing?: A Little 6 Click Score: 20    End of Session Equipment Utilized During Treatment: Rolling walker (2 wheels)  OT Visit Diagnosis: Unsteadiness on feet (R26.81);Other abnormalities of gait and mobility (R26.89);Muscle weakness (generalized) (M62.81)   Activity Tolerance Patient tolerated treatment well   Patient Left in bed;with call bell/phone within reach   Nurse Communication          Time: 9163-8466 OT Time Calculation (min): 12 min  Charges: OT General Charges $OT Visit: 1 Visit OT Treatments $Self Care/Home Management : 8-22 mins  Cleta Alberts, OTR/L Acute Rehabilitation Services Office: [phone]   [name] 07/01/2022, 2:58 PM

## 2022-07-02 ENCOUNTER — Inpatient Hospital Stay (HOSPITAL_COMMUNITY): Payer: Medicare Other

## 2022-07-02 ENCOUNTER — Encounter (HOSPITAL_COMMUNITY)
Admission: EM | Disposition: A | Discharge status: Home Health | Payer: Self-pay | Source: Home / Self Care | Attending: Internal Medicine

## 2022-07-02 DIAGNOSIS — I70245 Atherosclerosis of native arteries of left leg with ulceration of other part of foot: Secondary | ICD-10-CM | POA: Diagnosis not present

## 2022-07-02 DIAGNOSIS — M86179 Other acute osteomyelitis, unspecified ankle and foot: Secondary | ICD-10-CM

## 2022-07-02 DIAGNOSIS — A419 Sepsis, unspecified organism: Secondary | ICD-10-CM | POA: Diagnosis not present

## 2022-07-02 HISTORY — PX: ABDOMINAL AORTOGRAM W/LOWER EXTREMITY: CATH118223

## 2022-07-02 LAB — APTT: aPTT: 35 seconds (ref 24–36)

## 2022-07-02 LAB — GLUCOSE, CAPILLARY
Glucose-Capillary: 134 mg/dL — ABNORMAL HIGH (ref 70–99)
Glucose-Capillary: 134 mg/dL — ABNORMAL HIGH (ref 70–99)
Glucose-Capillary: 135 mg/dL — ABNORMAL HIGH (ref 70–99)
Glucose-Capillary: 139 mg/dL — ABNORMAL HIGH (ref 70–99)

## 2022-07-02 LAB — CBC
HCT: 24.1 % — ABNORMAL LOW (ref 39.0–52.0)
Hemoglobin: 8.3 g/dL — ABNORMAL LOW (ref 13.0–17.0)
MCH: 31 pg (ref 26.0–34.0)
MCHC: 34.4 g/dL (ref 30.0–36.0)
MCV: 89.9 fL (ref 80.0–100.0)
Platelets: 110 10*3/uL — ABNORMAL LOW (ref 150–400)
RBC: 2.68 MIL/uL — ABNORMAL LOW (ref 4.22–5.81)
RDW: 14.4 % (ref 11.5–15.5)
WBC: 5.6 10*3/uL (ref 4.0–10.5)
nRBC: 0 % (ref 0.0–0.2)

## 2022-07-02 LAB — RENAL FUNCTION PANEL
Albumin: 2.4 g/dL — ABNORMAL LOW (ref 3.5–5.0)
Anion gap: 9 (ref 5–15)
BUN: 13 mg/dL (ref 8–23)
CO2: 21 mmol/L — ABNORMAL LOW (ref 22–32)
Calcium: 8.7 mg/dL — ABNORMAL LOW (ref 8.9–10.3)
Chloride: 108 mmol/L (ref 98–111)
Creatinine, Ser: 1.36 mg/dL — ABNORMAL HIGH (ref 0.61–1.24)
GFR, Estimated: 52 mL/min — ABNORMAL LOW (ref >60)
Glucose, Bld: 144 mg/dL — ABNORMAL HIGH (ref 70–99)
Phosphorus: 3.2 mg/dL (ref 2.5–4.6)
Potassium: 3.2 mmol/L — ABNORMAL LOW (ref 3.5–5.1)
Sodium: 138 mmol/L (ref 135–145)

## 2022-07-02 LAB — HEPARIN LEVEL (UNFRACTIONATED): Heparin Unfractionated: 0.1 IU/mL — ABNORMAL LOW (ref 0.30–0.70)

## 2022-07-02 SURGERY — ABDOMINAL AORTOGRAM W/LOWER EXTREMITY
Anesthesia: LOCAL | Laterality: Left

## 2022-07-02 MED ORDER — LIDOCAINE HCL (PF) 1 % IJ SOLN
INTRAMUSCULAR | Status: DC | PRN
  Administered 2022-07-02: 15 mL

## 2022-07-02 MED ORDER — SODIUM CHLORIDE 0.9% FLUSH
3.0000 mL | Freq: Two times a day (BID) | INTRAVENOUS | Status: DC
  Administered 2022-07-03 – 2022-07-12 (×8): 3 mL via INTRAVENOUS

## 2022-07-02 MED ORDER — HEPARIN (PORCINE) IN NACL 1000-0.9 UT/500ML-% IV SOLN
INTRAVENOUS | Status: AC
Start: 2022-07-02 — End: ?
  Filled 2022-07-02: qty 1000

## 2022-07-02 MED ORDER — HEPARIN (PORCINE) 25000 UT/250ML-% IV SOLN
1750.0000 [IU]/h | INTRAVENOUS | Status: DC
  Administered 2022-07-02 – 2022-07-06 (×6): 1750 [IU]/h via INTRAVENOUS
  Filled 2022-07-02 (×8): qty 250

## 2022-07-02 MED ORDER — HYDRALAZINE HCL 20 MG/ML IJ SOLN
INTRAMUSCULAR | Status: AC
  Filled 2022-07-02: qty 1

## 2022-07-02 MED ORDER — ASPIRIN 81 MG PO TBEC
81.0000 mg | DELAYED_RELEASE_TABLET | Freq: Every day | ORAL | Status: DC
  Administered 2022-07-03 – 2022-07-13 (×10): 81 mg via ORAL
  Filled 2022-07-02 (×11): qty 1

## 2022-07-02 MED ORDER — FENTANYL CITRATE (PF) 100 MCG/2ML IJ SOLN
INTRAMUSCULAR | Status: AC
  Filled 2022-07-02: qty 2

## 2022-07-02 MED ORDER — SODIUM CHLORIDE 0.9 % IV SOLN: INTRAVENOUS | Status: DC

## 2022-07-02 MED ORDER — ONDANSETRON HCL 4 MG/2ML IJ SOLN: 4.0000 mg | Freq: Four times a day (QID) | INTRAMUSCULAR | Status: DC | PRN

## 2022-07-02 MED ORDER — SODIUM CHLORIDE 0.9 % WEIGHT BASED INFUSION: 1.0000 mL/kg/h | INTRAVENOUS | Status: AC

## 2022-07-02 MED ORDER — SODIUM CHLORIDE 0.9 % IV SOLN
250.0000 mL | INTRAVENOUS | Status: DC | PRN
  Administered 2022-07-07: 250 mL via INTRAVENOUS

## 2022-07-02 MED ORDER — ATORVASTATIN CALCIUM 40 MG PO TABS: 40.0000 mg | ORAL_TABLET | Freq: Every day | ORAL | Status: DC

## 2022-07-02 MED ORDER — LABETALOL HCL 5 MG/ML IV SOLN: 10.0000 mg | INTRAVENOUS | Status: DC | PRN

## 2022-07-02 MED ORDER — HEPARIN (PORCINE) IN NACL 1000-0.9 UT/500ML-% IV SOLN
INTRAVENOUS | Status: DC | PRN
  Administered 2022-07-02 (×2): 500 mL

## 2022-07-02 MED ORDER — MIDAZOLAM HCL 2 MG/2ML IJ SOLN
INTRAMUSCULAR | Status: AC
  Filled 2022-07-02: qty 2

## 2022-07-02 MED ORDER — HYDRALAZINE HCL 20 MG/ML IJ SOLN
5.0000 mg | INTRAMUSCULAR | Status: DC | PRN
  Administered 2022-07-02: 5 mg via INTRAVENOUS

## 2022-07-02 MED ORDER — SODIUM CHLORIDE 0.9% FLUSH: 3.0000 mL | INTRAVENOUS | Status: DC | PRN

## 2022-07-02 MED ORDER — MIDAZOLAM HCL 2 MG/2ML IJ SOLN
INTRAMUSCULAR | Status: DC | PRN
  Administered 2022-07-02: .5 mg via INTRAVENOUS

## 2022-07-02 MED ORDER — FENTANYL CITRATE (PF) 100 MCG/2ML IJ SOLN
INTRAMUSCULAR | Status: DC | PRN
  Administered 2022-07-02: 25 ug via INTRAVENOUS

## 2022-07-02 MED ORDER — POTASSIUM CHLORIDE CRYS ER 20 MEQ PO TBCR
40.0000 meq | EXTENDED_RELEASE_TABLET | Freq: Once | ORAL | Status: AC
Start: 2022-07-02 — End: 2022-07-02
  Administered 2022-07-02: 40 meq via ORAL
  Filled 2022-07-02: qty 2

## 2022-07-02 MED ORDER — IODIXANOL 320 MG/ML IV SOLN
INTRAVENOUS | Status: DC | PRN
  Administered 2022-07-02: 80 mL

## 2022-07-02 MED ORDER — LIDOCAINE HCL (PF) 1 % IJ SOLN
INTRAMUSCULAR | Status: AC
Start: 2022-07-02 — End: ?
  Filled 2022-07-02: qty 30

## 2022-07-02 MED ORDER — ACETAMINOPHEN 325 MG PO TABS: 650.0000 mg | ORAL_TABLET | ORAL | Status: DC | PRN

## 2022-07-02 SURGICAL SUPPLY — 11 items
CATH NAVICROSS ST 65CM (CATHETERS) IMPLANT
CATH OMNI FLUSH 5F 65CM (CATHETERS) IMPLANT
CATHETER NAVICROSS ST 65CM (CATHETERS) ×1
GUIDEWIRE ANGLED .035X150CM (WIRE) IMPLANT
KIT MICROPUNCTURE NIT STIFF (SHEATH) IMPLANT
KIT PV (KITS) ×1 IMPLANT
SHEATH PINNACLE 5F 10CM (SHEATH) IMPLANT
SYR MEDRAD MARK V 150ML (SYRINGE) IMPLANT
TRANSDUCER W/STOPCOCK (MISCELLANEOUS) ×1 IMPLANT
TRAY PV CATH (CUSTOM PROCEDURE TRAY) ×1 IMPLANT
WIRE BENTSON .035X145CM (WIRE) IMPLANT

## 2022-07-02 NOTE — TOC Progression Note (Signed)
Transition of Care (TOC) - Progression Note    Patient Details  Name: Romaldo Saville. MRN: [id number] Date of Birth: 02-08-1941  Transition of Care [facility]) CM/SW Contact  Tom-Johnson, Renea Ee, RN Phone Number: 07/02/2022, 12:54 PM  Clinical Narrative:     Patient is scheduled for Left lower extremity Angiogram today.  CM will continue to follow with needs.   Expected Discharge Plan: [place] ([place] Barriers to Discharge: Continued Medical Work up  Expected Discharge Plan and Services Expected Discharge Plan: [place] ([place]   Discharge Planning Services: CM Consult Post Acute Care Choice: [place] arrangements for the past 2 months: [place]                 DME Arranged: N/A DME Agency: NA       HH Arranged: PT, OT, RN, Disease Management [place] Agency: [place] Date [place]: 06/28/22 Time [place]: [id number] Representative spoke with at [place]: Levada Dy   Social Determinants of Health ([place]) Interventions    Readmission Risk Interventions     No data to display

## 2022-07-02 NOTE — Progress Notes (Signed)
VASCULAR SURGERY:  I have reviewed his arteriogram.  On the left he does reconstitute a peroneal artery which then reconstitutes a small dorsalis pedis at the foot.  Vein map has been ordered.  I think his only chance for limb salvage would be a fem peroneal bypass.  Hopefully he has adequate vein for an autogenous bypass.  I have explained that we would first explore the peroneal artery to be sure that this was a reasonable target to bypass in 2.  He seems agreeable to proceed with surgery.  Dr. Stanford Breed plans on a left fem peroneal on Wednesday.  I would leave him on IV heparin for now and hold his Eliquis.  Gae Gallop, MD 3:27 PM

## 2022-07-02 NOTE — Progress Notes (Signed)
Progress Note Patient: Steve [name]. QHU:[id number] DOB: 08-06-1941 DOA: 06/25/2022  DOS: the patient was seen and examined on 07/02/2022  Brief hospital course: Steve [name]. is a 81 y.o. male with a history of CAD s/p CABG, hypertension, hyperlipidemia, diabetes mellitus type 2, PAD, atrial fibrillation. Patient presented secondary to tremors and was found to have fevers with concern for possible infection. SIRS criteria met without source identified. Patient started empirically on Vancomycin, Cefepime and Flagyl. Blood and urine cultures obtained. MRI significant for osteomyelitis. Podiatry and vascular surgery consulted. S/P angiogram on 9/8.  Recommendation is for femoropopliteal bypass currently 81 scheduled on 9/13.  Will remain on IV heparin until then.  Assessment and Plan: Sepsis secondary to cellulitis, present on admission Chronic ulcer which could also be source as patient has confirmed osteomyelitis. Blood and urine cultures are no growth. Empiric Vancomycin, Cefepime and Flagyl initiated on admission and patient has been deescalated to Cefepime.  As of 9/6 now on cefazolin. -Continue cefazolin   AKI on CKD stage IIIa Patient's baseline creatinine appears to be around 1.2. Creatinine of 2.47 on admission.  Improving. Treated with IV fluids.  Monitor.   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 Patient followed with podiatry previously. Confirmed osteomyelitis of the fifth metatarsal head/neck in addition to early osteomyelitis of the fifth digit proximal phalanx on MRI from 9/3. Podiatry and vascular surgery following the patient.   Hyponatremia Sodium of 126 on admission. Likely related to dehydration.  Improving.   Foot ulcer Cellulitis Patient with a chronic ulcer. Complicated by diabetes. Foot x-ray without evidence of osteomyelitis. Cellulitis significantly improved with antibiotics.   Acute urinary retention Unsure of etiology. Patient appears to be  asymptomatic. Urinalysis is not suggestive of infection. Patient with continued urinary retention -Foley catheter inserted on 9/3.  Attempt voiding trial next Monday. -Outpatient urology follow-up   Constipation Some improvement with bowel regimen.   Diabetes mellitus type 2 uncontrolled with hyperglycemia with nonhealing wounds. Patient is on Januvia, Levemir and metformin as an outpatient. Most recent hemoglobin A1C is 7.9%. -Continue Levemir and SSI   CAD Hyperlipidemia PAD No chest pain. Stable. Continue Zetia, on [place] outpatient. ABI significantly abnormal.  Angiography shows evidence of significant stenotic disease.   Primary hypertension -Continue Imdur and Toprol XL   Paroxysmal atrial fibrillation -Patient was on Eliquis and Toprol XL Currently on IV heparin.   Thrombocytopenia No current bleeding. Somewhat stable.   Acute encephalopathy. Mostly delirium Etiology not clear most likely combination of prolonged hospitalization and medication side effect.  Unable to differentiate further whether metabolic or toxic. Now resolved. Monitor.  Prolonged QTc. QTc is 501. Monitor for now.  Subjective: No nausea no vomiting no fever no chills  Physical Exam: Vitals:   07/02/22 1315 07/02/22 1322 07/02/22 1358 07/02/22 1742  BP: (!) 174/52 (!) 174/55 (!) 164/57 (!) 164/59  Pulse: 63 (!) 56 (!) 58 61  Resp: 15 16 16 18   Temp:   97.8 F (36.6 C) 98.1 F (36.7 C)  TempSrc:   Oral Oral  SpO2: 99% 99% 100% 98%  Weight:      Height:       General: Appear in mild distress; no visible Abnormal Neck Mass Or lumps, Conjunctiva normal Cardiovascular: S1 and S2 Present, aortic systolic  Murmur, Respiratory: good respiratory effort, Bilateral Air entry present and CTA, no Crackles, no wheezes Abdomen: Bowel Sound present, Non tender  Extremities: no Pedal edema Neurology: alert and oriented to time, place, and  person  Gait not checked due to patient safety concerns    Data Reviewed: I have Reviewed nursing notes, Vitals, and Lab results since pt's last encounter. Pertinent lab results CBC and BMP I have ordered test including CBC and BMP    Family Communication: No one at bedside  Disposition: Status is: Inpatient Remains inpatient appropriate because: Requiring bypass graft procedure currently scheduled on Wednesday.  Need to be on IV heparin until then.  Author: Berle Mull, MD 07/02/2022 6:21 PM  Please look on [email or web address] to find out who is on call.

## 2022-07-02 NOTE — Progress Notes (Signed)
[facility] OF [place] PROGRESS NOTE  ASSESSMENT / PLAN: Steve [name]. is a 81 y.o. male with ischemic ulceration of the left foot. Plan angiography today. All questions answered.   SUBJECTIVE: No complaints.  OBJECTIVE: BP (!) 160/69 (BP Location: Right Arm)   Pulse 65   Temp 97.7 F (36.5 C) (Oral)   Resp 19   Ht 5\' 9"  (1.753 m)   Wt 87.8 kg   SpO2 99%   BMI 28.58 kg/m   Intake/Output Summary (Last 24 hours) at 07/02/2022 1228 Last data filed at 07/02/2022 1000 Gross per 24 hour  Intake 1956.45 ml  Output 3202 ml  Net -1245.55 ml    Chronically ill elderly gentleman Regular rate and rhythm Unlabored breathing Unchanged appearance of left foot     Latest Ref Rng & Units 07/02/2022    9:17 AM 07/01/2022    6:20 AM 06/30/2022   10:01 AM  CBC  WBC 4.0 - 10.5 K/uL 5.6  5.7  7.9   Hemoglobin 13.0 - 17.0 g/dL 8.3  7.9  9.6   Hematocrit 39.0 - 52.0 % 24.1  22.7  26.8   Platelets 150 - 400 K/uL 110  101  95         Latest Ref Rng & Units 07/02/2022    9:17 AM 07/01/2022    6:20 AM 06/28/2022    3:07 AM  CMP  Glucose 70 - 99 mg/dL 144  126  95   BUN 8 - 23 mg/dL 13  13  20    Creatinine 0.61 - 1.24 mg/dL 1.36  1.38  1.51   Sodium 135 - 145 mmol/L 138  138  133   Potassium 3.5 - 5.1 mmol/L 3.2  2.8  3.5   Chloride 98 - 111 mmol/L 108  108  108   CO2 22 - 32 mmol/L 21  22  16    Calcium 8.9 - 10.3 mg/dL 8.7  8.5  8.4     Estimated Creatinine Clearance: 45.1 mL/min (A) (by C-G formula based on SCr of 1.36 mg/dL (H)).  Steve Aline. Stanford Breed, MD [facility] of [facility] Phone Number: [phone] 07/02/2022 12:28 PM

## 2022-07-02 NOTE — Progress Notes (Signed)
Site area: rt groin Site Prior to Removal:  Level 0 Pressure Applied For: 20 minutes Manual:   yes Patient Status During Pull:  stable Post Pull Site:  Level 0 Post Pull Instructions Given:  yes Post Pull Pulses Present: rt dp dopplered Dressing Applied:  gauze and tegaderm Bedrest begins @ 3494 Comments:

## 2022-07-02 NOTE — Plan of Care (Signed)

## 2022-07-02 NOTE — Progress Notes (Signed)
Attempted lower extremity vein mapping, however patient stated that he needed a bedpan. Will attempt again as schedule permits.Refer to   07/02/2022 4:24 PM Kelby Aline., MHA, RVT, RDCS, RDMS

## 2022-07-02 NOTE — Op Note (Signed)
DATE OF SERVICE: 07/02/2022  PATIENT:  Steve [name].  81 y.o. male  PRE-OPERATIVE DIAGNOSIS:  Atherosclerosis of native arteries of left lower extremity causing ulceration  POST-OPERATIVE DIAGNOSIS:  Same  PROCEDURE:   1) Ultrasound guided right common femoral artery access 2) Aortogram 3) Left lower extremity angiogram with second order cannulation (65mL total contrast) 4) Conscious sedation (24 minutes)  SURGEON:  [name], MD  ASSISTANT: none  ANESTHESIA:   local and IV sedation  ESTIMATED BLOOD LOSS: minimal  LOCAL MEDICATIONS USED:  LIDOCAINE   COUNTS: confirmed correct.  PATIENT DISPOSITION:  PACU - hemodynamically stable.   Delay start of Pharmacological VTE agent (>24hrs) due to surgical blood loss or risk of bleeding: no  INDICATION FOR PROCEDURE: Janard Culp. is a 81 y.o. male with left foot ischemic ulceration. After careful discussion of risks, benefits, and alternatives the patient was offered angiography. The patient understood and wished to proceed.  OPERATIVE FINDINGS:  Terminal aorta and iliac arteries: Tortuous, heavily calcified. No flow limiting stenosis.  Left lower extremity: Common femoral artery: Heavily calcified. Moderate distal stenosis prior to bifurcation (~50%).  Profunda femoris artery: patent without stenosis  Superficial femoral artery: heavily calcified. Occludes at Hunter's canal. Popliteal artery: Occluded. A small island of below knee popliteal artery reconstitutes and then occludes. Anterior tibial artery: occluded Tibioperoneal trunk: occluded Peroneal artery: reconstitutes in proximal calf. Flows to ankle and gives off collateralization to fill the foot Posterior tibial artery: occluded Pedal circulation: disadvantaged.  GLASS score. FP: 4. IP: 3. Stage III disease.   WIfI score. 1 / 3 / 0. Moderate amputation risk. High benefit from revascularization.  DESCRIPTION OF PROCEDURE: After identification of the patient  in the pre-operative holding area, the patient was transferred to the operating room. The patient was positioned supine on the operating room table. Anesthesia was induced. The groins was prepped and draped in standard fashion. A surgical pause was performed confirming correct patient, procedure, and operative location.  The right groin was anesthetized with subcutaneous injection of 1% lidocaine. Using ultrasound guidance, the right common femoral artery was accessed with micropuncture technique. Fluoroscopy was used to confirm cannulation over the femoral head. The 32F sheath was upsized to 33F.   A Benson wire was advanced into the distal aorta. Over the wire an omni flush catheter was advanced to the level of L2. Aortogram was performed - see above for details.   The left common iliac artery was selected with an omniflush catheter and glidewire guidewire. The wire was advanced into the common femoral artery. Over the wire the omni flush catheter was advanced into the external iliac artery. Selective angiography was performed - see above for details.   The sheath was left in place to be removed in the holding area.  Conscious sedation was administered with the use of IV fentanyl and midazolam under continuous physician and nurse monitoring.  Heart rate, blood pressure, and oxygen saturation were continuously monitored.  Total sedation time was 24 minutes  Upon completion of the case instrument and sharps counts were confirmed correct. The patient was transferred to the PACU in good condition. I was present for all portions of the procedure.  PLAN: ASA 81mg  PO QD. High intensity statin therapy. Resume anticoagulation 8 hours post sheath pull. Only surgical option is common femoral to peroneal artery bypass. He may not tolerate this given his age and comorbidity. Will discuss with Dr. Scot Dock. Will check vein mapping.   [name],  MD [facility] of [facility] Phone  Number: [phone] 07/02/2022 12:29 PM

## 2022-07-02 NOTE — Progress Notes (Signed)
ANTICOAGULATION CONSULT NOTE  Pharmacy Consult for Heparin Indication: atrial fibrillation  Allergies  Allergen Reactions   Januvia [Sitagliptin] Other (See Comments)    Soreness to stomach area. Intolerance    Statins Other (See Comments)    Myalgias, muscle weakness, swelling, pain    Patient Measurements: Height: 5\' 9"  (175.3 cm) Weight: 87.8 kg (193 lb 9 oz) IBW/kg (Calculated) : 70.7  Heparin Dosing Weight: 87 kg  Vital Signs: Temp: 97.7 F (36.5 C) (09/08 0920) Temp Source: Oral (09/08 0920) BP: 174/55 (09/08 1322) Pulse Rate: 56 (09/08 1322)  Labs: Recent Labs    06/29/22 1804 06/29/22 1804 06/30/22 1001 06/30/22 2058 07/01/22 0620 07/02/22 0917  HGB  --    < > 9.6*  --  7.9* 8.3*  HCT  --   --  26.8*  --  22.7* 24.1*  PLT  --   --  95*  --  101* 110*  APTT 38*  --   --  66* 78* 35  HEPARINUNFRC >1.10*  --   --   --  0.54 0.10*  CREATININE  --   --   --   --  1.38* 1.36*   < > = values in this interval not displayed.     Estimated Creatinine Clearance: 45.1 mL/min (A) (by C-G formula based on SCr of 1.36 mg/dL (H)).   Assessment: 81 yo male admitted for left foot wound infection on apixaban PTA for afib. Apixaban now held for pending arteriography +/- revascularization on Friday. Pharmacy consulted to manage IV heparin while apixaban on hold.   Heparin stopped for arteriography - okay to resume heparin 8 hours after sheath (documented on 9/8@1315 ). Hgb 8.3, plt 110. Was previously therapeutic on 1750 units/hr.   Goal of Therapy:  Heparin level 0.3-0.7 units/ml aPTT 66-102 seconds Monitor platelets by anticoagulation protocol: Yes   Plan:  Restart heparin infusion at 1750 units/hr on 9/8@2130  Check heparin level/APTT 8 hr after restart and daily while on heparin Continue to monitor H&H and platelets  Antonietta Jewel, PharmD, BCCCP Clinical Pharmacist  Phone: [phone] 07/02/2022 1:51 PM  Please check AMION for all [place] phone numbers After  10:00 PM, call [place] [phone]

## 2022-07-02 NOTE — Progress Notes (Signed)
   PODIATRY PROGRESS NOTE  NAME Steve [name]. MRN [id number] DOB [birth year] DOA 06/25/2022   Reason for consult:  Chief Complaint  Patient presents with   Tremors     History of present illness: 81 y.o. male with left foot wound and likely osteomyelitis.  Vascular surgery preformed angio today. No current pain. No fevers or chills.   Vitals:   07/02/22 1742 07/02/22 2011  BP: (!) 164/59 (!) 143/58  Pulse: 61 65  Resp: 18 20  Temp: 98.1 F (36.7 C) 98 F (36.7 C)  SpO2: 98% 98%     CBC    Component Value Date/Time   WBC 5.6 07/02/2022 0917   RBC 2.68 (L) 07/02/2022 0917   HGB 8.3 (L) 07/02/2022 0917   HGB 9.9 (L) 07/24/2018 1423   HCT 24.1 (L) 07/02/2022 0917   PLT 110 (L) 07/02/2022 0917   PLT 128 (L) 07/24/2018 1423   MCV 89.9 07/02/2022 0917   MCH 31.0 07/02/2022 0917   MCHC 34.4 07/02/2022 0917   RDW 14.4 07/02/2022 0917   LYMPHSABS 0.3 (L) 06/25/2022 1550   MONOABS 0.2 06/25/2022 1550   EOSABS 0.1 06/25/2022 1550   BASOSABS 0.0 06/25/2022 1550        Latest Ref Rng & Units 07/02/2022    9:17 AM 07/01/2022    6:20 AM 06/28/2022    3:07 AM  BMP  Glucose 70 - 99 mg/dL 144  126  95   BUN 8 - 23 mg/dL 13  13  20    Creatinine 0.61 - 1.24 mg/dL 1.36  1.38  1.51   Sodium 135 - 145 mmol/L 138  138  133   Potassium 3.5 - 5.1 mmol/L 3.2  2.8  3.5   Chloride 98 - 111 mmol/L 108  108  108   CO2 22 - 32 mmol/L 21  22  16    Calcium 8.9 - 10.3 mg/dL 8.7  8.5  8.4        Physical Exam: General: AAOx3, NAD  Dermatology: Dressing intact and recently changed.   Vascular: Bandage intact  Neurological: Sensation decreased  Musculoskeletal Exam: No pain on exam    ASSESSMENT/PLAN OF CARE  Left foot ulceration, osteomyelitis, PAD  -Underwent angio today and will likely proceed with bypass on 9/13.  -Continue daily dressing changes.  -WBAT in surgical shoe, ordered today -I will check on his wound on Sunday.   Please contact me directly with any questions  or concerns.     Celesta Gentile, DPM [facility]  Dr. Bonna Gains. [address], [address] [place], [place] [address]                Office [phone]  Fax [phone]

## 2022-07-02 NOTE — Discharge Instructions (Addendum)
[facility] Vein Specialists of [place]  Discharge Instructions  Lower Extremity Angiogram; Angioplasty/Stenting  Please refer to the following instructions for your post-procedure care. Your surgeon or physician assistant will discuss any changes with you.  Activity  Avoid lifting more than 8 pounds (1 gallons of milk) for 5 days after your procedure. You may walk as much as you can tolerate. It's OK to drive after 72 hours.  Bathing/Showering  You may shower the day after your procedure. If you have a bandage, you may remove it at 24- 48 hours. Clean your incision site with mild soap and water. Pat the area dry with a clean towel.  Diet  Resume your pre-procedure diet. There are no special food restrictions following this procedure. All patients with peripheral vascular disease should follow a low fat/low cholesterol diet. In order to heal from your surgery, it is CRITICAL to get adequate nutrition. Your body requires vitamins, minerals, and protein. Vegetables are the best source of vitamins and minerals. Vegetables also provide the perfect balance of protein. Processed food has little nutritional value, so try to avoid this.  Medications  Resume taking all of your medications unless your doctor tells you not to. If your incision is causing pain, you may take over-the-counter pain relievers such as acetaminophen (Tylenol)  Follow Up  Follow up will be arranged at the time of your procedure. You may have an office visit scheduled or may be scheduled for surgery. Ask your surgeon if you have any questions.  Please call us immediately for any of the following conditions: .Severe or worsening pain your legs or feet at rest or with walking. .Increased pain, redness, drainage at your groin puncture site. .Fever of 101 degrees or higher. .If you have any mild or slow bleeding from your puncture site: lie down, apply firm constant pressure over the area with a piece of gauze or a  clean wash cloth for 30 minutes- no peeking!, call 911 right away if you are still bleeding after 30 minutes, or if the bleeding is heavy and unmanageable.  Reduce your risk factors of vascular disease:  . Stop smoking. If you would like help call [facility] at 1-800-QUIT-NOW (1-800-784-8669) or [place] at 336-586-4000. . Manage your cholesterol . Maintain a desired weight . Control your diabetes . Keep your blood pressure down .  If you have any questions, please call the office at [phone]     Information on my medicine - ELIQUIS (apixaban)  Why was Eliquis prescribed for you? Eliquis was prescribed for you to reduce the risk of a blood clot forming that can cause a stroke if you have a medical condition called atrial fibrillation (a type of irregular heartbeat).  What do You need to know about Eliquis ? Take your Eliquis TWICE DAILY - one tablet in the morning and one tablet in the evening with or without food. If you have difficulty swallowing the tablet whole please discuss with your pharmacist how to take the medication safely.  Take Eliquis exactly as prescribed by your doctor and DO NOT stop taking Eliquis without talking to the doctor who prescribed the medication.  Stopping may increase your risk of developing a stroke.  Refill your prescription before you run out.  After discharge, you should have regular check-up appointments with your healthcare provider that is prescribing your Eliquis.  In the future your dose may need to be changed if your kidney function or weight changes by a significant amount or as you get   older.  What do you do if you miss a dose? If you miss a dose, take it as soon as you remember on the same day and resume taking twice daily.  Do not take more than one dose of ELIQUIS at the same time to make up a missed dose.  Important Safety Information A possible side effect of Eliquis is bleeding. You should call your healthcare provider  right away if you experience any of the following: ? Bleeding from an injury or your nose that does not stop. ? Unusual colored urine (red or dark brown) or unusual colored stools (red or black). ? Unusual bruising for unknown reasons. ? A serious fall or if you hit your head (even if there is no bleeding).  Some medicines may interact with Eliquis and might increase your risk of bleeding or clotting while on Eliquis. To help avoid this, consult your healthcare provider or pharmacist prior to using any new prescription or non-prescription medications, including herbals, vitamins, non-steroidal anti-inflammatory drugs (NSAIDs) and supplements.  This website has more information on Eliquis (apixaban): http://www.eliquis.com/eliquis/home  

## 2022-07-03 ENCOUNTER — Inpatient Hospital Stay (HOSPITAL_BASED_OUTPATIENT_CLINIC_OR_DEPARTMENT_OTHER): Payer: Medicare Other

## 2022-07-03 DIAGNOSIS — A419 Sepsis, unspecified organism: Secondary | ICD-10-CM | POA: Diagnosis not present

## 2022-07-03 DIAGNOSIS — Z01810 Encounter for preprocedural cardiovascular examination: Secondary | ICD-10-CM

## 2022-07-03 DIAGNOSIS — R9431 Abnormal electrocardiogram [ECG] [EKG]: Secondary | ICD-10-CM | POA: Diagnosis present

## 2022-07-03 LAB — RENAL FUNCTION PANEL
Albumin: 2.5 g/dL — ABNORMAL LOW (ref 3.5–5.0)
Anion gap: 11 (ref 5–15)
BUN: 14 mg/dL (ref 8–23)
CO2: 18 mmol/L — ABNORMAL LOW (ref 22–32)
Calcium: 8.4 mg/dL — ABNORMAL LOW (ref 8.9–10.3)
Chloride: 109 mmol/L (ref 98–111)
Creatinine, Ser: 1.44 mg/dL — ABNORMAL HIGH (ref 0.61–1.24)
GFR, Estimated: 48 mL/min — ABNORMAL LOW (ref >60)
Glucose, Bld: 144 mg/dL — ABNORMAL HIGH (ref 70–99)
Phosphorus: 3.4 mg/dL (ref 2.5–4.6)
Potassium: 3.5 mmol/L (ref 3.5–5.1)
Sodium: 138 mmol/L (ref 135–145)

## 2022-07-03 LAB — GLUCOSE, CAPILLARY
Glucose-Capillary: 144 mg/dL — ABNORMAL HIGH (ref 70–99)
Glucose-Capillary: 123 mg/dL — ABNORMAL HIGH (ref 70–99)
Glucose-Capillary: 108 mg/dL — ABNORMAL HIGH (ref 70–99)
Glucose-Capillary: 101 mg/dL — ABNORMAL HIGH (ref 70–99)

## 2022-07-03 LAB — CBC
HCT: 24.7 % — ABNORMAL LOW (ref 39.0–52.0)
Hemoglobin: 8.3 g/dL — ABNORMAL LOW (ref 13.0–17.0)
MCH: 30.7 pg (ref 26.0–34.0)
MCHC: 33.6 g/dL (ref 30.0–36.0)
MCV: 91.5 fL (ref 80.0–100.0)
Platelets: 120 10*3/uL — ABNORMAL LOW (ref 150–400)
RBC: 2.7 MIL/uL — ABNORMAL LOW (ref 4.22–5.81)
RDW: 14.6 % (ref 11.5–15.5)
WBC: 7 10*3/uL (ref 4.0–10.5)
nRBC: 0 % (ref 0.0–0.2)

## 2022-07-03 LAB — LIPID PANEL
Cholesterol: 98 mg/dL (ref 0–200)
HDL: 17 mg/dL — ABNORMAL LOW (ref >40)
LDL Cholesterol: 47 mg/dL (ref 0–99)
Total CHOL/HDL Ratio: 5.8 RATIO
Triglycerides: 170 mg/dL — ABNORMAL HIGH (ref <150)
VLDL: 34 mg/dL (ref 0–40)

## 2022-07-03 LAB — APTT: aPTT: 71 seconds — ABNORMAL HIGH (ref 24–36)

## 2022-07-03 LAB — HEPARIN LEVEL (UNFRACTIONATED): Heparin Unfractionated: 0.37 IU/mL (ref 0.30–0.70)

## 2022-07-03 MED ORDER — ATORVASTATIN CALCIUM 40 MG PO TABS
40.0000 mg | ORAL_TABLET | Freq: Every day | ORAL | Status: DC
  Administered 2022-07-03 – 2022-07-13 (×11): 40 mg via ORAL
  Filled 2022-07-03 (×11): qty 1

## 2022-07-03 NOTE — Progress Notes (Addendum)
ANTICOAGULATION CONSULT NOTE  Pharmacy Consult for Heparin Indication: atrial fibrillation  Allergies  Allergen Reactions   Januvia [Sitagliptin] Other (See Comments)    Soreness to stomach area. Intolerance    Statins Other (See Comments)    Myalgias, muscle weakness, swelling, pain    Patient Measurements: Height: 5\' 9"  (175.3 cm) Weight: 87.8 kg (193 lb 9 oz) IBW/kg (Calculated) : 70.7  Heparin Dosing Weight: 87 kg  Vital Signs: Temp: 98 F (36.7 C) (09/09 0326) Temp Source: Oral (09/09 0326) BP: 169/62 (09/09 0326) Pulse Rate: 76 (09/09 0326)  Labs: Recent Labs    07/01/22 0620 07/02/22 0917 07/03/22 0607  HGB 7.9* 8.3* 8.3*  HCT 22.7* 24.1* 24.7*  PLT 101* 110* 120*  APTT 78* 35 71*  HEPARINUNFRC 0.54 0.10* 0.37  CREATININE 1.38* 1.36* 1.44*     Estimated Creatinine Clearance: 42.6 mL/min (A) (by C-G formula based on SCr of 1.44 mg/dL (H)).   Assessment: 81 yo male admitted for left foot wound infection on apixaban PTA for afib. Apixaban now held for pending arteriography +/- revascularization on Friday. Last dose apixaban 9/5. Pharmacy consulted to manage IV heparin while apixaban on hold.  Heparin stopped for arteriography - okay to resume heparin 8 hours after sheath (documented on 9/8@1315 ). Hgb 8.3, plt 120. Was previously therapeutic on 1750 units/hr.  Heparin level 0.37 and aPTT 71 therapeutic and correlating on 1750 units/hr. No issues with infusion or bleeding noted.   Goal of Therapy:  Heparin level 0.3-0.7 units/ml aPTT 66-102 seconds Monitor platelets by anticoagulation protocol: Yes   Plan:  Continue heparin infusion at 1750 units/hr  Check daily heparin level while on heparin Continue to monitor H&H and platelets, s/sx bleeding  F/u plans for fem peroneal artery bypass (planned for 9/13) and transition back to [place], PharmD PGY1 Pharmacy Resident   07/03/2022  7:52 AM   Please check AMION for all [place] phone  numbers After 10:00 PM, call [place] [phone]

## 2022-07-03 NOTE — Progress Notes (Signed)
PHARMACIST LIPID MONITORING   Hendrick Pavich. is a 81 y.o. male admitted on 06/25/2022 with PAD.  Pharmacy has been consulted to optimize lipid-lowering therapy with the indication of secondary prevention for clinical ASCVD.  Recent Labs:  Lipid Panel (last 6 months):   Lab Results  Component Value Date   CHOL 98 07/03/2022   TRIG 170 (H) 07/03/2022   HDL 17 (L) 07/03/2022   CHOLHDL 5.8 07/03/2022   VLDL 34 07/03/2022   LDLCALC 47 07/03/2022    Hepatic function panel (last 6 months):   Lab Results  Component Value Date   AST 24 06/25/2022   ALT 15 06/25/2022   ALKPHOS 50 06/25/2022   BILITOT 0.6 06/25/2022    SCr (since admission):   Serum creatinine: 1.44 mg/dL (H) 07/03/22 0607 Estimated creatinine clearance: 42.6 mL/min (A)  Current therapy and lipid therapy tolerance Current lipid-lowering therapy: ezetimibe 10 mg daily  Previous lipid-lowering therapies (if applicable): lovastatin 20 mg daily  Documented or reported allergies or intolerances to lipid-lowering therapies (if applicable): statins - myalgias, muscle weakness, swelling, pain   Assessment:   Patient agrees with changes to lipid-lowering therapy  Plan:    1.Statin intensity (high intensity recommended for all patients regardless of the LDL):  Add or increase statin to high intensity. Starting atorvastatin 40 mg daily. Discussed with patient. Patient is willing to re-trail statin but will let provider know if muscle symptoms return.   Continue ezetimibe 10 mg daily.   3.Refer to lipid clinic:   No  4.Follow-up with:  Primary care provider - System, Provider Not In  5.Follow-up labs after discharge:  Changes in lipid therapy were made. Check a lipid panel in 8-12 weeks then annually.      Eliseo Gum, PharmD PGY1 Pharmacy Resident   07/03/2022  9:27 AM

## 2022-07-03 NOTE — Progress Notes (Addendum)
  Progress Note    07/03/2022 9:23 AM 1 Day Post-Op  Subjective:  no complaints   Vitals:   07/03/22 0326 07/03/22 0800  BP: (!) 169/62 (!) 161/67  Pulse: 76 62  Resp: 16 18  Temp: 98 F (36.7 C) 97.6 F (36.4 C)  SpO2: 100% 100%   Physical Exam: Cardiac:  regular Lungs:  non labored Extremities:  right groin access site c/d/I without swelling or hematoma Abdomen:  soft Neurologic: alert and oriented  CBC    Component Value Date/Time   WBC 7.0 07/03/2022 0607   RBC 2.70 (L) 07/03/2022 0607   HGB 8.3 (L) 07/03/2022 0607   HGB 9.9 (L) 07/24/2018 1423   HCT 24.7 (L) 07/03/2022 0607   PLT 120 (L) 07/03/2022 0607   PLT 128 (L) 07/24/2018 1423   MCV 91.5 07/03/2022 0607   MCH 30.7 07/03/2022 0607   MCHC 33.6 07/03/2022 0607   RDW 14.6 07/03/2022 0607   LYMPHSABS 0.3 (L) 06/25/2022 1550   MONOABS 0.2 06/25/2022 1550   EOSABS 0.1 06/25/2022 1550   BASOSABS 0.0 06/25/2022 1550    BMET    Component Value Date/Time   NA 138 07/03/2022 0607   K 3.5 07/03/2022 0607   CL 109 07/03/2022 0607   CO2 18 (L) 07/03/2022 0607   GLUCOSE 144 (H) 07/03/2022 0607   BUN 14 07/03/2022 0607   CREATININE 1.44 (H) 07/03/2022 0607   CALCIUM 8.4 (L) 07/03/2022 0607   GFRNONAA 48 (L) 07/03/2022 0607   GFRAA 46 (L) 05/19/2020 1311    INR    Component Value Date/Time   INR 1.06 12/17/2018 1157     Intake/Output Summary (Last 24 hours) at 07/03/2022 0923 Last data filed at 07/03/2022 1610 Gross per 24 hour  Intake 2021.91 ml  Output 2075 ml  Net -53.09 ml     Assessment/Plan:  81 y.o. male is s/p Aortogram, LLE runoff 1 Day Post-Op   Does not have great options for revascularization. Only surgical option is CF to peroneal artery bypass for limb salvage Right groin access site c/d/I without swelling or hematoma Continue Aspirin, statin, gtt Heparin (Eliquis on hold until after surgery) Vein map pending Plan is for right femoral to peroneal bypass on Wednesday 9/13 with Dr.  Susanne Borders, PA-C [facility] [phone] 07/03/2022 9:23 AM  VASCULAR STAFF ADDENDUM: I have independently interviewed and examined the patient. I agree with the above.  Access groin soft. Plan for high-risk bypass Wednesday, will involve cardiology Monday for preoperative risk assessment.  Cassandria Santee, MD [facility] Phone Number: [phone] 07/03/2022 10:03 AM

## 2022-07-03 NOTE — Progress Notes (Signed)
VASCULAR LAB    Lower extremity vein mapping has been performed.  See CV proc for preliminary results.   [place], RVT 07/03/2022, 10:27 AM

## 2022-07-03 NOTE — Progress Notes (Signed)
Orthopedic Tech Progress Note Patient Details:  Steve [name] 09/12/1941 [id number]  Ortho Devices Type of Ortho Device: Postop shoe/boot Ortho Device/Splint Location: Left foot Ortho Device/Splint Interventions: Application   Post Interventions Patient Tolerated: Well  Steve [name] Steve [name] 07/03/2022, 3:21 PM

## 2022-07-03 NOTE — Progress Notes (Signed)
Pharmacy Antibiotic Note  Steve [name]. is a 81 y.o. male admitted on 06/25/2022 with left foot wound with concerns for osteomyelitis. Pharmacy has been consulted for Cefazolin dosing. Pt is afebrile. WBC wnl. Scr relatively stable likely at baseline.   Plan: Cefazolin 2g IV Q8h Trend WBC, fever, renal function F/u surgical plans and LOT (planning for R femoral to peroneal bypass 9/13)   Height: 5\' 9"  (175.3 cm) Weight: 87.8 kg (193 lb 9 oz) IBW/kg (Calculated) : 70.7  Temp (24hrs), Avg:98 F (36.7 C), Min:97.6 F (36.4 C), Max:98.5 F (36.9 C)  Recent Labs  Lab 06/27/22 0444 06/28/22 0307 06/30/22 1001 07/01/22 0620 07/02/22 0917 07/03/22 0607  WBC 5.0 5.3 7.9 5.7 5.6 7.0  CREATININE 1.69* 1.51*  --  1.38* 1.36* 1.44*     Estimated Creatinine Clearance: 42.6 mL/min (A) (by C-G formula based on SCr of 1.44 mg/dL (H)).    Allergies  Allergen Reactions   Januvia [Sitagliptin] Other (See Comments)    Soreness to stomach area. Intolerance    Statins Other (See Comments)    Myalgias, muscle weakness, swelling, pain    Antimicrobials this admission: Cefepime 9/1 >> 9/05 Metronidazole 9/1 >> 9/4 Vancomycin 9/1 >> 9/2 Cefazolin 9/6 >>   Microbiology results: 9/1 BCx: NG final 9/1 UCx: NG final  Thank you for allowing pharmacy to be a part of this patient's care.  Eliseo Gum, PharmD PGY1 Pharmacy Resident   07/03/2022  9:58 AM

## 2022-07-03 NOTE — Progress Notes (Signed)
Progress Note Patient: Steve [name]. QJJ:[id number] DOB: Aug 28, 1941 DOA: 06/25/2022  DOS: the patient was seen and examined on 07/03/2022  Brief hospital course: Steve [name]. is a 81 y.o. male with a history of CAD s/p CABG, hypertension, hyperlipidemia, diabetes mellitus type 2, PAD, atrial fibrillation. Patient presented secondary to tremors and was found to have fevers with concern for possible infection. SIRS criteria met without source identified. Patient started empirically on Vancomycin, Cefepime and Flagyl. Blood and urine cultures obtained. MRI significant for osteomyelitis. Podiatry and vascular surgery consulted. S/P angiogram on 9/8.  Recommendation is for femoropopliteal bypass currently scheduled on 9/13.  Will remain on IV heparin until then. 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  Associated with cellulitis. Osteomyelitis of the fifth metatarsal head/neck in addition to early osteomyelitis of the fifth digit proximal phalanx on MRI from 9/3. Blood and urine cultures are no growth.  Empiric Vancomycin, Cefepime and Flagyl initiated on admission and patient has been deescalated to Cefepime.   As of 9/6 now on cefazolin. -Continue cefazolin  - We will discuss with ID after vascular and podiatry procedure with regards to type and duration of the antibiotic treatment Patient does not want to lose his foot.  AKI on CKD stage IIIa Patient's baseline creatinine appears to be around 1.2. Creatinine of 2.47 on admission. Treated with IV fluids. Mild worsening after angiography. Monitor.   Hyponatremia Sodium of 126 on admission. Likely related to dehydration.  Resolved    Acute urinary retention Unsure of etiology.  Patient appears to be asymptomatic.  Urinalysis is not suggestive of infection. Patient with continued urinary retention -Foley catheter inserted on 9/3.   Attempt voiding trial done 9/11 or after bypass surgery. -Outpatient urology follow-up   Constipation Resolved.  Continue current bowel regimen.   Diabetes mellitus type 2 uncontrolled with hyperglycemia with nonhealing wounds. Patient is on Januvia, Levemir and metformin as an outpatient. Most recent hemoglobin A1C is 7.9%. -Continue Levemir and SSI   CAD Hyperlipidemia PAD No chest pain. Stable. Continue Zetia, on Repatha outpatient. ABI significantly abnormal.  Angiography shows evidence of significant stenotic disease.   Primary hypertension -Continue Imdur and Toprol XL   Paroxysmal atrial fibrillation -Patient was on Eliquis and Toprol XL Currently on IV heparin.   Thrombocytopenia No current bleeding. stable.   Acute encephalopathy. Mostly delirium Etiology not clear  most likely combination of prolonged hospitalization and medication side effect.  Unable to differentiate further whether metabolic or toxic. Now resolved. Monitor.   Prolonged QTc. QTc is 501. Monitor for now.  Avoid QT prolonging medications.  Subjective: No nausea vomiting no fever no chills.  Pain well controlled with just Tylenol.  Physical Exam: Vitals:   07/03/22 0326 07/03/22 0800 07/03/22 1127 07/03/22 1557  BP: (!) 169/62 (!) 161/67 (!) 152/75 (!) 163/67  Pulse: 76 62 72 67  Resp: _0 Temp: 98 F (36.7 C) 97.6 F (36.4 C) 97.6 F (36.4 C) 98.2 F (36.8 C)  TempSrc: Oral Oral Oral Oral  SpO2: 100% 100% 97% 99%  Weight:      Height:       General: Appear in mild distress; no visible Abnormal Neck Mass Or lumps, Conjunctiva normal Cardiovascular: S1 and S2 Present, no Murmur, Respiratory: good respiratory effort, Bilateral Air entry present and CTA, no Crackles, no wheezes Abdomen: Bowel Sound present, Non tender Extremities: trace Pedal edema Neurology:  alert and oriented to time, place, and person  Gait not checked due to patient safety concerns   Data Reviewed: I  have Reviewed nursing notes, Vitals, and Lab results since pt's last encounter. Pertinent lab results CBC and BMP I have ordered test including CBC and BMP    Family Communication: No one at bedside  Disposition: Status is: Inpatient Remains inpatient appropriate because: Scheduled for procedure on Wednesday.  Author: Berle Mull, MD 07/03/2022 4:42 PM  Please look on [email or web address] to find out who is on call.

## 2022-07-04 ENCOUNTER — Inpatient Hospital Stay (HOSPITAL_COMMUNITY): Payer: Medicare Other

## 2022-07-04 DIAGNOSIS — A419 Sepsis, unspecified organism: Secondary | ICD-10-CM | POA: Diagnosis not present

## 2022-07-04 DIAGNOSIS — M86179 Other acute osteomyelitis, unspecified ankle and foot: Secondary | ICD-10-CM

## 2022-07-04 LAB — CBC
HCT: 23.8 % — ABNORMAL LOW (ref 39.0–52.0)
Hemoglobin: 8.1 g/dL — ABNORMAL LOW (ref 13.0–17.0)
MCH: 30.9 pg (ref 26.0–34.0)
MCHC: 34 g/dL (ref 30.0–36.0)
MCV: 90.8 fL (ref 80.0–100.0)
Platelets: 110 10*3/uL — ABNORMAL LOW (ref 150–400)
RBC: 2.62 MIL/uL — ABNORMAL LOW (ref 4.22–5.81)
RDW: 14.8 % (ref 11.5–15.5)
WBC: 5.4 10*3/uL (ref 4.0–10.5)
nRBC: 0 % (ref 0.0–0.2)

## 2022-07-04 LAB — GLUCOSE, CAPILLARY
Glucose-Capillary: 131 mg/dL — ABNORMAL HIGH (ref 70–99)
Glucose-Capillary: 134 mg/dL — ABNORMAL HIGH (ref 70–99)
Glucose-Capillary: 163 mg/dL — ABNORMAL HIGH (ref 70–99)
Glucose-Capillary: 131 mg/dL — ABNORMAL HIGH (ref 70–99)

## 2022-07-04 LAB — HEPARIN LEVEL (UNFRACTIONATED): Heparin Unfractionated: 0.43 IU/mL (ref 0.30–0.70)

## 2022-07-04 MED ORDER — HYDRALAZINE HCL 25 MG PO TABS
25.0000 mg | ORAL_TABLET | Freq: Three times a day (TID) | ORAL | Status: DC
  Administered 2022-07-04 – 2022-07-09 (×15): 25 mg via ORAL
  Filled 2022-07-04 (×15): qty 1

## 2022-07-04 MED ORDER — HYDRALAZINE HCL 20 MG/ML IJ SOLN: 10.0000 mg | INTRAMUSCULAR | Status: DC | PRN

## 2022-07-04 NOTE — Progress Notes (Signed)
Mobility Specialist Progress Note    07/04/22 1316  Mobility  Activity Ambulated with assistance in hallway  Level of Assistance Standby assist, set-up cues, supervision of patient - no hands on  Assistive Device Front wheel walker  Distance Ambulated (ft) 315 ft  Activity Response Tolerated well  $Mobility charge 1 Mobility   Pt received in bed requesting to use the BR. Had BM. No complaints during. Returned to bed with call bell in reach.   Hildred Alamin Mobility Specialist

## 2022-07-04 NOTE — Progress Notes (Signed)
ANTICOAGULATION CONSULT NOTE  Pharmacy Consult for Heparin Indication: atrial fibrillation  Allergies  Allergen Reactions   Januvia [Sitagliptin] Other (See Comments)    Soreness to stomach area. Intolerance    Statins Other (See Comments)    Myalgias, muscle weakness, swelling, pain    Patient Measurements: Height: 5\' 9"  (175.3 cm) Weight: 87.8 kg (193 lb 9 oz) IBW/kg (Calculated) : 70.7  Heparin Dosing Weight: 87 kg  Vital Signs: Temp: 98 F (36.7 C) (09/10 0430) Temp Source: Oral (09/10 0430) BP: 157/60 (09/10 0430) Pulse Rate: 60 (09/10 0430)  Labs: Recent Labs    07/02/22 0917 07/03/22 0607 07/04/22 0341  HGB 8.3* 8.3* 8.1*  HCT 24.1* 24.7* 23.8*  PLT 110* 120* 110*  APTT 35 71*  --   HEPARINUNFRC 0.10* 0.37 0.43  CREATININE 1.36* 1.44*  --      Estimated Creatinine Clearance: 42.6 mL/min (A) (by C-G formula based on SCr of 1.44 mg/dL (H)).   Assessment: 81 yo male admitted for left foot wound infection on apixaban PTA for afib. Apixaban now held for pending arteriography +/- revascularization on Friday. Last dose apixaban 9/5. Pharmacy consulted to manage IV heparin while apixaban on hold.  Heparin stopped for arteriography - okay to resume heparin 8 hours after sheath (documented on 9/8@1315 ). Was previously therapeutic on 1750 units/hr.  Heparin level 0.43 therapeutic on 1750 units/hr. Hgb 8.1, plt 110 slightly decreased.  No issues with infusion or bleeding noted.   Goal of Therapy:  Heparin level 0.3-0.7 units/ml aPTT 66-102 seconds Monitor platelets by anticoagulation protocol: Yes   Plan:  Continue heparin infusion at 1750 units/hr  Check daily heparin level while on heparin Continue to monitor H&H and platelets, s/sx bleeding  F/u plans for fem peroneal artery bypass (planned for 9/13) and transition back to [place], PharmD PGY1 Pharmacy Resident   07/04/2022  7:58 AM   Please check AMION for all [place] phone  numbers After 10:00 PM, call [place] [phone]

## 2022-07-04 NOTE — Progress Notes (Signed)
   PODIATRY PROGRESS NOTE  NAME Steve [name]. MRN [id number] DOB 1940/11/05 DOA 06/25/2022   Reason for consult:  Chief Complaint  Patient presents with   Tremors     History of present illness: 81 y.o. male with left foot wound and likely osteomyelitis.  Vascular surgery preformed angio and is scheduled for bypass later this week. He is having right foot pain. No sign of pain to the left.  No fevers or chills.  Mild  Vitals:   07/02/22 1742 07/02/22 2011  BP: (!) 164/59 (!) 143/58  Pulse: 61 65  Resp: 18 20  Temp: 98.1 F (36.7 C) 98 F (36.7 C)  SpO2: 98% 98%     CBC    Component Value Date/Time   WBC 5.6 07/02/2022 0917   RBC 2.68 (L) 07/02/2022 0917   HGB 8.3 (L) 07/02/2022 0917   HGB 9.9 (L) 07/24/2018 1423   HCT 24.1 (L) 07/02/2022 0917   PLT 110 (L) 07/02/2022 0917   PLT 128 (L) 07/24/2018 1423   MCV 89.9 07/02/2022 0917   MCH 31.0 07/02/2022 0917   MCHC 34.4 07/02/2022 0917   RDW 14.4 07/02/2022 0917   LYMPHSABS 0.3 (L) 06/25/2022 1550   MONOABS 0.2 06/25/2022 1550   EOSABS 0.1 06/25/2022 1550   BASOSABS 0.0 06/25/2022 1550        Latest Ref Rng & Units 07/02/2022    9:17 AM 07/01/2022    6:20 AM 06/28/2022    3:07 AM  BMP  Glucose 70 - 99 mg/dL 144  126  95   BUN 8 - 23 mg/dL 13  13  20    Creatinine 0.61 - 1.24 mg/dL 1.36  1.38  1.51   Sodium 135 - 145 mmol/L 138  138  133   Potassium 3.5 - 5.1 mmol/L 3.2  2.8  3.5   Chloride 98 - 111 mmol/L 108  108  108   CO2 22 - 32 mmol/L 21  22  16    Calcium 8.9 - 10.3 mg/dL 8.7  8.5  8.4        Physical Exam: General: AAOx3, NAD  Dermatology: Chronic ulceration present submetatarsal 5 left foot there is exposed tendon.  There is mild edema there is no drainage or pus.  No ascending status.  No fluctuance or crepitation.  No malodor.  Vascular: Is decreased  Neurological: Sensation decreased  Musculoskeletal Exam: She is tenderness to the right foot, ankle but no specific area of tenderness.  No  significant edema there is no open lesions.    ASSESSMENT/PLAN OF CARE  Left foot ulceration, osteomyelitis, PAD  -Continue daily dressing changes. -Weight-bear as tolerated left foot surgical shoe -X-ray right ankle already ordered -Awaiting bypass later this week.  We will follow-up with him after the bypass but if we can be of any assistance prior to that please contact us.   Please contact me directly with any questions or concerns.     Celesta Gentile, DPM [facility]  Dr[address] Bonna Gains. [address], [address] [place], [place] [address]                Office [phone]  Fax [phone]

## 2022-07-04 NOTE — Progress Notes (Signed)
[facility] PROGRESS NOTE  Patient: Steve [name]. PNT:[id number]   PCP: System, Provider Not In DOB: 1941/03/11   DOA: 06/25/2022   DOS: 07/04/2022    Subjective: No acute complaint.  No nausea or vomiting.  Reports that his right ankle has been bothering him also prior to admission and he is not able to bear any weight on that as well.  No chest pain.  No shortness of breath.  No open ulcers on the right leg.  Objective:  Vitals:   07/04/22 0003 07/04/22 0430 07/04/22 0814 07/04/22 1217  BP: (!) 163/66 (!) 157/60 (!) 171/57 (!) 149/53  Pulse: 63 60 (!) 59 60  Resp: 15 16 19 20   Temp: 98.4 F (36.9 C) 98 F (36.7 C) 97.8 F (36.6 C) 98.4 F (36.9 C)  TempSrc: Oral Oral Oral Oral  SpO2: 100% 99% 100% 100%  Weight:      Height:       Clear to auscultation. S1-S2 present. Bowel sound present No edema in the lower extremity. Right ankle has some warmth but no redness or erythema or open ulcers.  Assessment and plan: Right ankle pain. Check x-ray ankle.  AKI on CKD 3A. Treated with IV fluids. We will recheck labs tomorrow.  HTN. Blood pressure elevated. Currently on Imdur and Toprol-XL. We will add hydralazine.  Author: Berle Mull, MD [facility] 07/04/2022 4:08 PM   If 7PM-7AM, please contact night-coverage at [email or web address]

## 2022-07-05 ENCOUNTER — Encounter (HOSPITAL_BASED_OUTPATIENT_CLINIC_OR_DEPARTMENT_OTHER): Payer: Medicare Other | Admitting: General Surgery

## 2022-07-05 ENCOUNTER — Inpatient Hospital Stay (HOSPITAL_COMMUNITY): Payer: Medicare Other

## 2022-07-05 ENCOUNTER — Encounter (HOSPITAL_COMMUNITY): Payer: Self-pay | Admitting: Vascular Surgery

## 2022-07-05 DIAGNOSIS — I5020 Unspecified systolic (congestive) heart failure: Secondary | ICD-10-CM

## 2022-07-05 DIAGNOSIS — Z01810 Encounter for preprocedural cardiovascular examination: Secondary | ICD-10-CM | POA: Diagnosis not present

## 2022-07-05 DIAGNOSIS — I2510 Atherosclerotic heart disease of native coronary artery without angina pectoris: Secondary | ICD-10-CM

## 2022-07-05 DIAGNOSIS — I10 Essential (primary) hypertension: Secondary | ICD-10-CM

## 2022-07-05 DIAGNOSIS — I5042 Chronic combined systolic (congestive) and diastolic (congestive) heart failure: Secondary | ICD-10-CM | POA: Diagnosis not present

## 2022-07-05 DIAGNOSIS — A419 Sepsis, unspecified organism: Secondary | ICD-10-CM | POA: Diagnosis not present

## 2022-07-05 LAB — ECHOCARDIOGRAM COMPLETE
Area-P 1/2: 3.5 cm2
Calc EF: 35.2 %
Height: 69 in
MV M vel: 5.25 m/s
MV Peak grad: 110.3 mmHg
Radius: 0.7 cm
S' Lateral: 4.9 cm
Single Plane A2C EF: 38.1 %
Single Plane A4C EF: 33.9 %
Weight: 3097.02 oz

## 2022-07-05 LAB — CBC
HCT: 24.2 % — ABNORMAL LOW (ref 39.0–52.0)
Hemoglobin: 8 g/dL — ABNORMAL LOW (ref 13.0–17.0)
MCH: 30.5 pg (ref 26.0–34.0)
MCHC: 33.1 g/dL (ref 30.0–36.0)
MCV: 92.4 fL (ref 80.0–100.0)
Platelets: 113 10*3/uL — ABNORMAL LOW (ref 150–400)
RBC: 2.62 MIL/uL — ABNORMAL LOW (ref 4.22–5.81)
RDW: 15.1 % (ref 11.5–15.5)
WBC: 5.1 10*3/uL (ref 4.0–10.5)
nRBC: 0 % (ref 0.0–0.2)

## 2022-07-05 LAB — RENAL FUNCTION PANEL
Albumin: 2.5 g/dL — ABNORMAL LOW (ref 3.5–5.0)
Anion gap: 8 (ref 5–15)
BUN: 12 mg/dL (ref 8–23)
CO2: 19 mmol/L — ABNORMAL LOW (ref 22–32)
Calcium: 7.9 mg/dL — ABNORMAL LOW (ref 8.9–10.3)
Chloride: 111 mmol/L (ref 98–111)
Creatinine, Ser: 1.49 mg/dL — ABNORMAL HIGH (ref 0.61–1.24)
GFR, Estimated: 46 mL/min — ABNORMAL LOW (ref >60)
Glucose, Bld: 118 mg/dL — ABNORMAL HIGH (ref 70–99)
Phosphorus: 4.6 mg/dL (ref 2.5–4.6)
Potassium: 3 mmol/L — ABNORMAL LOW (ref 3.5–5.1)
Sodium: 138 mmol/L (ref 135–145)

## 2022-07-05 LAB — GLUCOSE, CAPILLARY
Glucose-Capillary: 134 mg/dL — ABNORMAL HIGH (ref 70–99)
Glucose-Capillary: 150 mg/dL — ABNORMAL HIGH (ref 70–99)
Glucose-Capillary: 143 mg/dL — ABNORMAL HIGH (ref 70–99)
Glucose-Capillary: 125 mg/dL — ABNORMAL HIGH (ref 70–99)

## 2022-07-05 LAB — HEPARIN LEVEL (UNFRACTIONATED): Heparin Unfractionated: 0.46 IU/mL (ref 0.30–0.70)

## 2022-07-05 MED ORDER — LACTATED RINGERS IV SOLN: INTRAVENOUS | Status: DC

## 2022-07-05 MED ORDER — POTASSIUM CHLORIDE CRYS ER 20 MEQ PO TBCR
40.0000 meq | EXTENDED_RELEASE_TABLET | ORAL | Status: AC
  Administered 2022-07-05 (×2): 40 meq via ORAL
  Filled 2022-07-05 (×2): qty 2

## 2022-07-05 NOTE — Progress Notes (Signed)
ANTICOAGULATION CONSULT NOTE  Pharmacy Consult for Heparin Indication: atrial fibrillation  Allergies  Allergen Reactions   Januvia [Sitagliptin] Other (See Comments)    Soreness to stomach area. Intolerance    Statins Other (See Comments)    Myalgias, muscle weakness, swelling, pain    Patient Measurements: Height: 5\' 9"  (175.3 cm) Weight: 87.8 kg (193 lb 9 oz) IBW/kg (Calculated) : 70.7  Heparin Dosing Weight: 87 kg  Vital Signs: Temp: 98 F (36.7 C) (09/11 0253) Temp Source: Oral (09/11 0253) BP: 158/58 (09/11 0253) Pulse Rate: 61 (09/11 0253)  Labs: Recent Labs    07/02/22 0917 07/03/22 0607 07/04/22 0341 07/05/22 0250  HGB 8.3* 8.3* 8.1* 8.0*  HCT 24.1* 24.7* 23.8* 24.2*  PLT 110* 120* 110* 113*  APTT 35 71*  --   --   HEPARINUNFRC 0.10* 0.37 0.43 0.46  CREATININE 1.36* 1.44*  --  1.49*    Estimated Creatinine Clearance: 41.2 mL/min (A) (by C-G formula based on SCr of 1.49 mg/dL (H)).   Assessment: 81 yo male admitted for left foot wound infection on apixaban PTA for afib. Apixaban now held for pending arteriography +/- revascularization on Friday. Last dose apixaban 9/5. Pharmacy consulted to manage IV heparin while apixaban on hold.  Heparin stopped for arteriography - okay to resume heparin 8 hours after sheath (documented on 9/8@1315 ). Was previously therapeutic on 1750 units/hr.  Heparin level 0.46 therapeutic on 1750 units/hr.  Hgb low-stable at 8. Plt 113, increasing.   No issues with infusion or bleeding noted.   Goal of Therapy:  Heparin level 0.3-0.7 units/ml aPTT 66-102 seconds Monitor platelets by anticoagulation protocol: Yes   Plan:  Continue heparin infusion at 1750 units/hr  Continue daily anti-Xa level, CBC, and monitor for any signs or symptoms of bleeding F/u plans for fem peroneal artery bypass (planned for 9/13) and transition back to [place], PharmD, BCPS, BCCCP Clinical Pharmacist Please refer to [facility] for  [place] numbers 07/05/2022  7:38 AM

## 2022-07-05 NOTE — Progress Notes (Signed)
Nutrition Follow-up  DOCUMENTATION CODES:   Not applicable  INTERVENTION:  Encourage adequate PO intake Ensure Enlive po TID, each supplement provides 350 kcal and 20 grams of protein. MVI with minerals daily Obtained meal preferences Request updated weight  NUTRITION DIAGNOSIS:   Increased nutrient needs related to acute illness as evidenced by estimated needs.  Ongoing  GOAL:   Patient will meet greater than or equal to 90% of their needs  Goal not met/progressing  MONITOR:   PO intake, Supplement acceptance  REASON FOR ASSESSMENT:   Consult Assessment of nutrition requirement/status  ASSESSMENT:   Pt with medical history significant of CAD s/p CABG; HTN; HLD; DM; PAD; and afib presenting with tremors. He reports that he felt bad today.  Pt with osteomyelitis of 5th digit proximal phalanx. Per Vascular, plans for L femoral-peroneal bypass on 9/13.   Spoke with pt at bedside. He reports doing well today. He denies eating lunch most days during admission as this is consistent with his baseline PO intake. He typically has eggs and bacon for breakfast; a snack midday which may include peanut butter crackers or mint oreo cookies; and soup with ice cream for dinner. His beverage intake includes Cheerwine Zero.   He is edentulous. Threw his dentures away as they were uncomfortable and he did not wear them. Denies needing modified textures throughout admission as he is able to find foods that he can tolerate. He reports having his meals ordered for him throughout admission which he is pleased with. He endorses enjoying Ensure supplements and drinks them when offered.  Pt denies recent weight loss and states that his usual weight is ~200 lbs.  Within the last 6 months it appears he has had a 2.8% weight loss which is not significant for time frame. Last documented weight was 9/2 at 87.8 kg. Will request updated weight to reassess.   Medications: colace, SSI 0-15 units TID, SSI  0-5 units qhs, levemir 15 units daily, MVI  Labs: heparin, LR @ 152m/hr, CBG's 131-163 x24 hours  NUTRITION - FOCUSED PHYSICAL EXAM:  Flowsheet Row Most Recent Value  Orbital Region No depletion  Upper Arm Region No depletion  Thoracic and Lumbar Region No depletion  Buccal Region No depletion  Temple Region No depletion  Clavicle Bone Region No depletion  Clavicle and Acromion Bone Region No depletion  Scapular Bone Region No depletion  Dorsal Hand No depletion  Patellar Region No depletion  Anterior Thigh Region No depletion  Posterior Calf Region No depletion  Edema (RD Assessment) None  Hair Reviewed  Eyes Reviewed  Mouth Reviewed  [edentulous]  Skin Reviewed  Nails Reviewed      Diet Order:   Diet Order             Diet Carb Modified Fluid consistency: Thin; Room service appropriate? No  Diet effective now                   EDUCATION NEEDS:   No education needs have been identified at this time  Skin:  Skin Assessment: Reviewed RN Assessment (L 5th toe ulcer)  Last BM:  9/11 (type 6)  Height:   Ht Readings from Last 1 Encounters:  06/25/22 5' 9"  (1.753 m)    Weight:   Wt Readings from Last 1 Encounters:  06/26/22 87.8 kg    Ideal Body Weight:  72.7 kg  BMI:  Body mass index is 28.58 kg/m.  Estimated Nutritional Needs:   Kcal:  1950-2150  Protein:  105-120 grams  Fluid:  > 1.9 L  Clayborne Dana, RDN, LDN Clinical Nutrition

## 2022-07-05 NOTE — Progress Notes (Signed)
[facility] PROGRESS NOTE  Patient: Steve [name]. DPT:[id number]   PCP: System, Provider Not In DOB: 31-Jan-1941   DOA: 06/25/2022   DOS: 07/05/2022    Subjective: No acute complaint.  No nausea no vomiting no fever no chills.  Objective:  Vitals:   07/05/22 0253 07/05/22 0807 07/05/22 1148 07/05/22 1700  BP: (!) 158/58 (!) 148/62 (!) 142/50 (!) 160/72  Pulse: 61 75 60 71  Resp: 17 20 20    Temp: 98 F (36.7 C) 97.9 F (36.6 C) 98.5 F (36.9 C) (!) 97.5 F (36.4 C)  TempSrc: Oral Oral Oral Axillary  SpO2: 97% 100% 100% 100%  Weight:      Height:       S1-S2 present. Bowel sound present.  Assessment and plan: Right ankle pain. X-ray shows no evidence of acute abnormality. Shows possibility of arthritis.  Acute kidney injury on chronic kidney disease 3A. Hypokalemia. Currently potassium being replaced. We will continue with IV fluids and monitor. Urine output adequate.  Preop clearance. Cardiology was consulted by vascular surgery currently patient cleared for scheduled surgery.  Author: Berle Mull, MD [facility] 07/05/2022 6:59 PM   If 7PM-7AM, please contact night-coverage at [email or web address]

## 2022-07-05 NOTE — Consult Note (Addendum)
Cardiology Consultation   Patient ID: Steve [name]. MRN: [id number]; DOB: [birth year]  Admit date: 06/25/2022 Date of Consult: 07/05/2022  PCP:  System, Provider Not In   [facility] Prov[facility] Cardiologist:  Peter Martinique, MD     Patient Profile:   Steve [name]. is a 81 y.o. male with a hx of CAD s/p CABG '13 (LIMA to LAD, SVG to OM1, SVG to distal circumflex and SVG to ramus), hypertension, hyperlipidemia, postop atrial fibrillation, diabetes, intermittent claudication and PVD who is being seen 07/05/2022 for the evaluation of preoperative evaluation at the request of Dr. Posey Pronto  History of Present Illness:   Steve [name] is an 81 year old male with past medical history noted above.  He has been followed by Dr. Martinique as an outpatient.  He underwent four-vessel CABG in 2013 with a LIMA to LAD, SVG to OM1, SVG to distal circumflex and SVG to ramus.    Echocardiogram 11/2018 revealed an EF of 35 to 40% along with hypokinesis of the basal inferior septal walls.  Underwent cardiac catheterization which revealed occluded SVG to ramus, SVG to circumflex/OM with patent LIMA to LAD, ramus with 70 to 80% at the site of graft insertion.  He was treated medically.  Transition to metoprolol XL 100 mg daily with amlodipine added.  Also has notable history of statin intolerance and was started on Zetia with consideration for PCSK9 inhibitors.  He was last seen in the office on 12/2018 with Jory Sims, NP and reported doing well.  He was continued on medications without further adjustments.   Presented to the ED on 9/1 with complaints of tremors and found to have fever with concern for infection.  He was treated for SIRS but no source identified.  Has been on vancomycin, cefepime and Flagyl.  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  He was seen by vascular on 9/5 with recommendations to undergo arteriogram.   Underwent PV  angiogram 9/8 with with recommendations for limb salvage to be a fem-peroneal bypass.  Eliquis has been on hold.  Left femoral-peroneal bypass planned for 913.  Cardiology now asked to evaluate.  Patient currently lives at [facility] assisted living.  He uses a walker to ambulate around and does a lot of his ADLs independently.  He denies any anginal symptoms with this or shortness of breath.  He has not reported any volume overload or lower extremity edema.  Past Medical History:  Diagnosis Date   CAD (coronary artery disease)    a. s/p CABG x 29 Nov 2011 with LIMA to LAD, SVG to OM1 and distal LCX, SVG to ramus intermediate   Essential hypertension    Hyperlipidemia    Intermittent claudication ([place])    Ischemic cardiomyopathy    a. 03/2015 Echo: EF45-50%, Gr1 DD, mild MR.   PAF (paroxysmal atrial fibrillation) ([place])    a. post op atrial fib 11/2011; short course of amiodarone; stopped 01/25/12   Peripheral arterial disease ([place])    Right bundle branch block    Type II diabetes mellitus [facility])     Past Surgical History:  Procedure Laterality Date   ABDOMINAL AORTOGRAM W/LOWER EXTREMITY Left 07/02/2022   Procedure: ABDOMINAL AORTOGRAM W/LOWER EXTREMITY;  Surgeon: Cherre Robins, MD;  Location: [place] CV [facility];  Service: Cardiovascular;  Laterality: Left;   CARDIAC CATHETERIZATION  01/16/1999   Est. EF of 65% -- Nonobstruction atherosclerotic coronary artery disease --  Normal left ventricular function       CARDIAC ELECTROPHYSIOLOGY STUDY AND ABLATION  05/21/1999   Normal sinus funtion -- Mildly prolonged interatrial conduction times -- Normal A-V node funtion -- Normal His Purkinje system function -- fNo accessory pathway -- No inducible ventricular tachycardia in the presence of the or in the absence of isoproterenol with programmed stimulation or with burst pacing -- Nikki Dom, M.D. [place]   CORONARY ARTERY BYPASS GRAFT  12/10/2011    Procedure: CORONARY ARTERY BYPASS GRAFTING (CABG);  Surgeon: Tharon Aquas Adelene Idler, MD;  Location: [place];  Service: Open Heart Surgery;  Laterality: N/A;  Coronary Artery bypass graft on pump times four utilizing left internal mammary artery and left saphenous vein harvested endoscopically   LEFT HEART CATH AND CORS/GRAFTS ANGIOGRAPHY N/A 12/18/2018   Procedure: LEFT HEART CATH AND CORS/GRAFTS ANGIOGRAPHY;  Surgeon: Belva Crome, MD;  Location: [place] [facility];  Service: Cardiovascular;  Laterality: N/A;   LEFT HEART CATHETERIZATION WITH CORONARY ANGIOGRAM N/A 12/09/2011   Procedure: LEFT HEART CATHETERIZATION WITH CORONARY ANGIOGRAM;  Surgeon: Peter M Martinique, MD;  Location: [facility] CATH LAB;  Service: Cardiovascular;  Laterality: N/A;   LOWER EXTREMITY ANGIOGRAM Left 07/28/2018   Procedure: Lower Extremity Angiogram;  Surgeon: Angelia Mould, MD;  Location: [place] [facility];  Service: Cardiovascular;  Laterality: Left;     Home Medications:  Prior to Admission medications   Medication Sig Start Date End Date Taking? Authorizing Provider  acetaminophen (TYLENOL) 500 MG tablet Take 500 mg by mouth every 6 (six) hours as needed for moderate pain.   Yes [provider]  Cholecalciferol (VITAMIN D3) 50 MCG (2000 UT) capsule Take 2,000 Units by mouth daily. 11/23/21  Yes [provider]  doxazosin (CARDURA) 2 MG tablet Take 2 mg by mouth daily. 11/30/21  Yes [provider]  ELIQUIS 2.5 MG TABS tablet Take 2.5 mg by mouth 2 (two) times daily. 11/23/21  Yes [provider]  insulin detemir (LEVEMIR) 100 UNIT/ML injection Inject 0.18 mLs (18 Units total) into the skin daily. Patient taking differently: Inject 25 Units into the skin daily. 04/19/19  Yes Mikhail, Velta Addison, DO  isosorbide mononitrate (IMDUR) 30 MG 24 hr tablet TAKE 1 TABLET DAILY (NEED OFFICE VISIT FOR REFILLS - 1ST ATTEMPT) Patient taking differently: Take 30 mg by mouth daily. 12/24/21  Yes Almyra Deforest, PA   metFORMIN (GLUCOPHAGE) 1000 MG tablet Take 1,000 mg by mouth daily. 03/31/20  Yes [provider]  metoprolol succinate (TOPROL-XL) 100 MG 24 hr tablet Take 1 tablet (100 mg total) by mouth daily. Take with or immediately following a meal. Patient taking differently: Take 100 mg by mouth daily with supper. Take with or immediately following a meal. 12/19/18  Yes Reino Bellis B, NP  silver sulfADIAZINE (SILVADENE) 1 % cream Apply 1 application  topically daily. 04/02/22  Yes Trula Slade, DPM  vitamin B-12 (CYANOCOBALAMIN) 500 MCG tablet Take 500 mcg by mouth daily with breakfast.    Yes [provider]  blood glucose meter kit and supplies Dispense based on patient and insurance preference. Use up to four times daily as directed. (FOR ICD-10 E10.9, E11.9). 04/18/19   Mikhail, Velta Addison, DO  cyclobenzaprine (FLEXERIL) 10 MG tablet Take 1 tablet (10 mg total) by mouth 2 (two) times daily as needed for muscle spasms. Patient not taking: Reported on 06/25/2022 01/26/22   Gareth Morgan,  MD  doxycycline (VIBRA-TABS) 100 MG tablet Take 1 tablet (100 mg total) by mouth 2 (two) times daily. Patient not taking: Reported on 06/25/2022 04/02/22   Trula Slade, DPM  ezetimibe (ZETIA) 10 MG tablet Take 1 tablet (10 mg total) by mouth daily. Patient not taking: Reported on 06/25/2022 11/05/20   Martinique, Peter M, MD  gabapentin (NEURONTIN) 100 MG capsule Take 1 capsule (100 mg total) by mouth at bedtime. Patient not taking: Reported on 06/25/2022 12/01/21   Trula Slade, DPM  Insulin Syringe-Needle U-100 (INSULIN SYRINGE .3CC/31GX5/16") 31G X 5/16" 0.3 ML MISC Use with insulin 04/18/19   Mikhail, Maryann, DO  JANUVIA 50 MG tablet Take 50 mg by mouth daily. Patient not taking: Reported on 01/26/2022 12/23/21   [provider]  amLODipine (NORVASC) 10 MG tablet TAKE 1 TABLET DAILY Patient not taking: Reported on 05/20/2020 11/09/19 05/20/20  Lendon Colonel, NP  furosemide (LASIX) 40 MG  tablet Take 1 tablet (40 mg total) by mouth daily. For 4 days then stop. 12/15/11 04/26/19  Nani Skillern, PA-C  insulin aspart (NOVOLOG) 100 UNIT/ML injection Inject 0-15 Units into the skin 3 (three) times daily with meals. CBG 121 - 150: 2 units;  CBG 151 - 200: 3 units;  CBG 201 - 250: 5 units;  CBG 251 - 300: 8 units; CBG 301 - 350: 11 units; CBG 351 - 400: 15 units;  CBG > 400 : 15 units and notify MD Patient not taking: Reported on 05/20/2020 04/18/19 05/20/20  Cristal Ford, DO    Inpatient Medications: Scheduled Meds:  aspirin EC  81 mg Oral Daily   atorvastatin  40 mg Oral Daily   Chlorhexidine Gluconate Cloth  6 each Topical Daily   docusate sodium  100 mg Oral BID   doxazosin  4 mg Oral Daily   ezetimibe  10 mg Oral Daily   feeding supplement  237 mL Oral TID BM   gabapentin  100 mg Oral QHS   hydrALAZINE  25 mg Oral Q8H   insulin aspart  0-15 Units Subcutaneous TID WC   insulin aspart  0-5 Units Subcutaneous QHS   insulin detemir  15 Units Subcutaneous Daily   isosorbide mononitrate  30 mg Oral Daily   metoprolol succinate  100 mg Oral Q supper   multivitamin with minerals  1 tablet Oral Daily   sodium chloride flush  3 mL Intravenous Q12H   sodium chloride flush  3 mL Intravenous Q12H   Continuous Infusions:  sodium chloride      ceFAZolin (ANCEF) IV 2 g (07/05/22 8527)   heparin 1,750 Units/hr (07/05/22 0500)   lactated ringers 50 mL/hr at 07/01/22 1711   lactated ringers 125 mL/hr at 07/05/22 0831   PRN Meds: sodium chloride, acetaminophen **OR** acetaminophen, albuterol, bisacodyl, cyclobenzaprine, hydrALAZINE, labetalol, lidocaine, LORazepam **OR** LORazepam, ondansetron **OR** ondansetron (ZOFRAN) IV, oxyCODONE, polyethylene glycol, sodium chloride flush  Allergies:    Allergies  Allergen Reactions   Januvia [Sitagliptin] Other (See Comments)    Soreness to stomach area. Intolerance    Statins Other (See Comments)    Myalgias, muscle weakness,  swelling, pain    Social History:   Social History   Socioeconomic History   Marital status: Widowed    Spouse name: Not on file   Number of children: 1   Years of education: Not on file   Highest education level: Not on file  Occupational History   Occupation: Retail buyer    Comment: retired  Tobacco Use   Smoking status: Former    Packs/day: 1.50    Years: 20.00    Total pack years: 30.00    Types: Cigarettes    Quit date: 05/27/1979    Years since quitting: 43.1   Smokeless tobacco: Former  Scientific laboratory technician Use: Never used  Substance and Sexual Activity   Alcohol use: Yes    Alcohol/week: 4.0 standard drinks of alcohol    Types: 4 Shots of liquor per week    Comment: 4 "canadian clubs each night"   Drug use: No   Sexual activity: Yes  Other Topics Concern   Not on file  Social History Narrative   Not on file   Social Determinants of Health   Financial Resource Strain: Not on file  Food Insecurity: No Food Insecurity (07/01/2022)   Hunger Vital Sign    Worried About Running Out of Food in the Last Year: Never true    Ran Out of Food in the Last Year: Never true  Transportation Needs: No Transportation Needs (07/01/2022)   PRAPARE - Hydrologist (Medical): No    Lack of Transportation (Non-Medical): No  Physical Activity: Not on file  Stress: Not on file  Social Connections: Not on file  Intimate Partner Violence: Not At Risk (07/01/2022)   Humiliation, Afraid, Rape, and Kick questionnaire    Fear of Current or Ex-Partner: No    Emotionally Abused: No    Physically Abused: No    Sexually Abused: No    Family History:    Family History  Problem Relation Age of Onset   Stroke Mother 70       Cerebellar hemorrhage     ROS:  Please see the history of present illness.   All other ROS reviewed and negative.     Physical Exam/Data:   Vitals:   07/04/22 1942 07/05/22 0024 07/05/22 0253 07/05/22 0807  BP: (!) 151/54 (!)  139/49 (!) 158/58 (!) 148/62  Pulse: 63 60 61 75  Resp: 16 18 17 20   Temp: 98.3 F (36.8 C) 98 F (36.7 C) 98 F (36.7 C) 97.9 F (36.6 C)  TempSrc: Oral Oral Oral Oral  SpO2: 99% 99% 97% 100%  Weight:      Height:        Intake/Output Summary (Last 24 hours) at 07/05/2022 1140 Last data filed at 07/05/2022 0808 Gross per 24 hour  Intake 1060.6 ml  Output 2150 ml  Net -1089.4 ml      06/26/2022    1:55 AM 06/25/2022    7:00 PM 01/07/2022    1:20 PM  Last 3 Weights  Weight (lbs) 193 lb 9 oz 209 lb 199 lb  Weight (kg) 87.8 kg 94.802 kg 90.266 kg     Body mass index is 28.58 kg/m.  General:  Well nourished, well developed, in no acute distress HEENT: normal Neck: no JVD Vascular: No carotid bruits; Distal pulses 2+ bilaterally Cardiac:  normal S1, S2; RRR; no murmur  Lungs:  clear to auscultation bilaterally, no wheezing, rhonchi or rales  Abd: soft, nontender, no hepatomegaly  Ext: no edema Musculoskeletal:  No deformities, BUE and BLE strength normal and equal Skin: warm and dry  Neuro:  CNs 2-12 intact, no focal abnormalities noted Psych:  Normal affect   EKG:  The EKG was personally reviewed and demonstrates:  Sinus Rhythm, 62bpm RBBB, LAFB, PVCs  Telemetry:  Telemetry was personally reviewed and demonstrates:  Sinus  Rhythm, PVCs  Relevant CV Studies:  Cath: 11/2018  Bypass graft failure with occluded saphenous vein sequential graft to the obtuse marginal and distal circumflex, and saphenous vein graft to the ramus intermedius. Patent LIMA to the LAD. Heavily calcified left main with patency into the circumflex and ramus intermedius. Ramus intermedius contains 70 to 80% stenosis in the proximal to mid vessel at the site of previous bypass graft insertion. Circumflex is widely patent.  Second obtuse marginal contains segmental 50% proximal narrowing. Right coronary is nondominant and contains 99% mid vessel stenosis. LVEDP 22 mmHg.   RECOMMENDATIONS:   Medical  therapy for obstructive coronary disease.  There is a PCI target if the patient is having sufficient angina to warrant intervention on the ramus intermedius.  In the absence of symptoms and with negative markers, medical therapy seems most appropriate.  This would also avoid dual antithrombotic therapy and exposure to increased bleeding risk. Discussed with Dr. Irish Lack.  Diagnostic Dominance: Left  Echo: 11/2018  IMPRESSIONS     1. The left ventricle has moderately reduced systolic function, with an  ejection fraction of 35-40%. The cavity size was mildly dilated. Left  ventricular diastolic Doppler parameters are consistent with impaired  relaxation.   2. Moderate hypokinesis of the basal left ventricular inferoseptal wall.   3. The right ventricle has normal systolic function. The cavity was  normal. There is no increase in right ventricular wall thickness. Right  ventricular systolic pressure could not be assessed.   4. The mitral valve is normal in structure. Mild calcification of the  mitral valve leaflet. There is mild mitral annular calcification present.  Mitral valve regurgitation is mild to moderate by color flow Doppler.   5. The tricuspid valve is normal in structure.   6. The aortic valve is tricuspid.   7. The aortic root is normal in size and structure.   8. There is mild dilatation of the ascending aorta.   FINDINGS   Left Ventricle: The left ventricle has moderately reduced systolic  function, with an ejection fraction of 35-40%. The cavity size was mildly  dilated. There is borderline increase in left ventricular wall thickness.  Left ventricular diastolic Doppler  parameters are consistent with impaired relaxation Moderate hypokinesis of  the left ventricular inferoseptal wall.  Right Ventricle: The right ventricle has normal systolic function. The  cavity was normal. There is no increase in right ventricular wall  thickness. Right ventricular systolic pressure  could not be assessed.  Left Atrium: left atrial size was normal in size  Right Atrium: right atrial size was normal in size.  Interatrial Septum: No atrial level shunt detected by color flow Doppler.  Pericardium: There is no evidence of pericardial effusion.  Mitral Valve: The mitral valve is normal in structure. Mild calcification  of the mitral valve leaflet. There is mild mitral annular calcification  present. Mitral valve regurgitation is mild to moderate by color flow  Doppler.  Tricuspid Valve: The tricuspid valve is normal in structure. Tricuspid  valve regurgitation is trivial by color flow Doppler.  Aortic Valve: The aortic valve is tricuspid Aortic valve regurgitation was  not visualized by color flow Doppler.  Pulmonic Valve: The pulmonic valve was grossly normal. Pulmonic valve  regurgitation is trivial by color flow Doppler.  Aorta: The aortic root is normal in size and structure. There is mild  dilatation of the ascending aorta.  Venous: The inferior vena cava was not well visualized.   Laboratory Data:  High Sensitivity  Troponin:  No results for input(s): "TROPONINIHS" in the last 720 hours.   Chemistry Recent Labs  Lab 07/01/22 0620 07/02/22 0917 07/03/22 0607 07/05/22 0250  NA 138 138 138 138  K 2.8* 3.2* 3.5 3.0*  CL 108 108 109 111  CO2 22 21* 18* 19*  GLUCOSE 126* 144* 144* 118*  BUN 13 13 14 12   CREATININE 1.38* 1.36* 1.44* 1.49*  CALCIUM 8.5* 8.7* 8.4* 7.9*  MG 1.8  --   --   --   GFRNONAA 51* 52* 48* 46*  ANIONGAP 8 9 11 8     Recent Labs  Lab 07/02/22 0917 07/03/22 0607 07/05/22 0250  ALBUMIN 2.4* 2.5* 2.5*   Lipids  Recent Labs  Lab 07/03/22 0607  CHOL 98  TRIG 170*  HDL 17*  LDLCALC 47  CHOLHDL 5.8    Hematology Recent Labs  Lab 07/03/22 0607 07/04/22 0341 07/05/22 0250  WBC 7.0 5.4 5.1  RBC 2.70* 2.62* 2.62*  HGB 8.3* 8.1* 8.0*  HCT 24.7* 23.8* 24.2*  MCV 91.5 90.8 92.4  MCH 30.7 30.9 30.5  MCHC 33.6 34.0 33.1  RDW  14.6 14.8 15.1  PLT 120* 110* 113*   Thyroid No results for input(s): "TSH", "FREET4" in the last 168 hours.  BNPNo results for input(s): "BNP", "PROBNP" in the last 168 hours.  DDimer No results for input(s): "DDIMER" in the last 168 hours.   Radiology/Studies:  DG Ankle Right Port  Result Date: 07/04/2022 CLINICAL DATA:  Right ankle pain EXAM: PORTABLE RIGHT ANKLE - 2 VIEW COMPARISON:  12/01/2021 FINDINGS: There is no evidence of fracture, dislocation, or joint effusion. There is no evidence of arthropathy or other focal bone abnormality. No focal soft tissue swelling. Advanced atherosclerotic vascular calcifications. IMPRESSION: Negative. Electronically Signed   By: Davina Poke D.O.   On: 07/04/2022 13:08   VAS [place] LOWER EXTREMITY SAPHENOUS VEIN MAPPING  Result Date: 07/03/2022 LOWER EXTREMITY VEIN MAPPING Patient Name:  Steve [name].  Date of Exam:   07/03/2022 Medical Rec #: [id number]         Accession #:    [id number] Date of Birth: October 25, 1941          Patient Gender: M Patient Age:   69 years Exam Location:  [facility] Procedure:      VAS [place] LOWER EXTREMITY SAPHENOUS VEIN MAPPING Referring Phys: Jamelle Haring --------------------------------------------------------------------------------  Indications:  Pre operative Risk Factors: PAD.  Comparison Study: No prior Performing Technologist: Sharion Dove RVS  Examination Guidelines: A complete evaluation includes B-mode imaging, spectral Doppler, color Doppler, and power Doppler as needed of all accessible portions of each vessel. Bilateral testing is considered an integral part of a complete examination. Limited examinations for reoccurring indications may be performed as noted. +---------------+-----------+----------------------+---------------+-----------+   RT Diameter  RT Findings         GSV            LT Diameter  LT Findings      (cm)                                            (cm)                   +---------------+-----------+----------------------+---------------+-----------+      0.48  Saphenofemoral         0.39                                                   Junction                                  +---------------+-----------+----------------------+---------------+-----------+      0.49                     Proximal thigh         0.18                  +---------------+-----------+----------------------+---------------+-----------+      0.46                       Mid thigh          0.21/0.18    branching  +---------------+-----------+----------------------+---------------+-----------+      0.44                      Distal thigh          0.19                  +---------------+-----------+----------------------+---------------+-----------+      0.40                          Knee              0.19                  +---------------+-----------+----------------------+---------------+-----------+      0.36                       Prox calf            0.18                  +---------------+-----------+----------------------+---------------+-----------+      0.36                        Mid calf            0.20                  +---------------+-----------+----------------------+---------------+-----------+      0.29                      Distal calf           0.28                  +---------------+-----------+----------------------+---------------+-----------+      0.26                         Ankle              0.20       branching  +---------------+-----------+----------------------+---------------+-----------+ Diagnosing physician: Orlie Pollen Electronically signed by Orlie Pollen on 07/03/2022 at 10:43:33 AM.    Final    PERIPHERAL VASCULAR CATHETERIZATION  Result Date: 07/02/2022 PATIENT:  Steve [name].  81 y.o. male  PRE-OPERATIVE DIAGNOSIS:  Atherosclerosis of native arteries of left lower extremity  causing ulceration   POST-OPERATIVE DIAGNOSIS:  Same  PROCEDURE:  1) Ultrasound guided right common femoral artery access 2) Aortogram 3) Left lower extremity angiogram with second order cannulation (95m total contrast) 4) Conscious sedation (24 minutes)  SURGEON:  TYevonne Aline HStanford Breed MD  ASSISTANT: none  ANESTHESIA:   local and IV sedation  ESTIMATED BLOOD LOSS: minimal  LOCAL MEDICATIONS USED:  LIDOCAINE  COUNTS: confirmed correct.  PATIENT DISPOSITION:  PACU - hemodynamically stable.  Delay start of Pharmacological VTE agent (>24hrs) due to surgical blood loss or risk of bleeding: no  INDICATION FOR PROCEDURE: Steve [name] is a 81y.o. male with left foot ischemic ulceration. After careful discussion of risks, benefits, and alternatives the patient was offered angiography. The patient understood and wished to proceed.  OPERATIVE FINDINGS: Terminal aorta and iliac arteries: Tortuous, heavily calcified. No flow limiting stenosis.  Left lower extremity: Common femoral artery: Heavily calcified. Moderate distal stenosis prior to bifurcation (~50%). Profunda femoris artery: patent without stenosis Superficial femoral artery: heavily calcified. Occludes at Hunter's canal. Popliteal artery: Occluded. A small island of below knee popliteal artery reconstitutes and then occludes. Anterior tibial artery: occluded Tibioperoneal trunk: occluded Peroneal artery: reconstitutes in proximal calf. Flows to ankle and gives off collateralization to fill the foot Posterior tibial artery: occluded Pedal circulation: disadvantaged.  GLASS score. FP: 4. IP: 3. Stage III disease.  WIfI score. 1 / 3 / 0. Moderate amputation risk. High benefit from revascularization.  DESCRIPTION OF PROCEDURE: After identification of the patient in the pre-operative holding area, the patient was transferred to the operating room. The patient was positioned supine on the operating room table. Anesthesia was induced. The groins was prepped and draped in standard fashion. A  surgical pause was performed confirming correct patient, procedure, and operative location.  The right groin was anesthetized with subcutaneous injection of 1% lidocaine. Using ultrasound guidance, the right common femoral artery was accessed with micropuncture technique. Fluoroscopy was used to confirm cannulation over the femoral head. The 60F sheath was upsized to 79F.  A Benson wire was advanced into the distal aorta. Over the wire an omni flush catheter was advanced to the level of L2. Aortogram was performed - see above for details.  The left common iliac artery was selected with an omniflush catheter and glidewire guidewire. The wire was advanced into the common femoral artery. Over the wire the omni flush catheter was advanced into the external iliac artery. Selective angiography was performed - see above for details.  The sheath was left in place to be removed in the holding area.  Conscious sedation was administered with the use of IV fentanyl and midazolam under continuous physician and nurse monitoring.  Heart rate, blood pressure, and oxygen saturation were continuously monitored.  Total sedation time was 24 minutes  Upon completion of the case instrument and sharps counts were confirmed correct. The patient was transferred to the PACU in good condition. I was present for all portions of the procedure.  PLAN: ASA 859mPO QD. High intensity statin therapy. Resume anticoagulation 8 hours post sheath pull. Only surgical option is common femoral to peroneal artery bypass. He may not tolerate this given his age and comorbidity. Will discuss with Dr. DiScot DockWill check vein mapping.  ThYevonne AlineHaStanford BreedMD [facility] Number: ([phone]/05/2022 12:29 PM     Assessment and Plan:   Steve Claunchis a [age].o. 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 Posey Pronto  Preop Evaluation Left LE wound/osteomyelitis  PAD -- Plan for left femoral-peroneal artery bypass on 9/13 with Dr. Stanford Breed.  -- remains on IV heparin with Eliquis on hold -- He is limited in his mobility but this is secondary to his PAD/wounds. Unable to complete >4 Mets. He is without anginal symptoms prior to admission. Will obtain updated echo to reassess LV function prior to surgery in order to best optimize. Do not anticipate further cardiac work up at this time.  CAD s/p CABG -- Last cardiac catheterization 2020 with patent LIMA to LAD, occluded SVG to ramus, SVG to circumflex/OM. No anginal symptoms prior to admisson  HFrEF ICM -- Echo 11/2018 with LVEF of 35 to 40%, moderate hypokinesis of the inferior septal wall, normal RV size and function -- no signs of volume overload -- update echo, monitor volume status in the perioperative period -- continue BB, unable to add ARB/ARNi/MRA with renal function. Could consider addition of SGLT2 in the post op period vs outpatient  HTN -- reasonably controlled -- continue Toprol XL 148m daily, hydralazine 264mTID  HLD -- on atorvastatin 4085maily  Paroxsymal Afib -- currently in SR -- holding Eliquis, on IV heparin  Hypokalemia -- supp K   Risk Assessment/Risk Scores:    CHA2DS2-VASc Score = 6   This indicates a 9.7% annual risk of stroke. The patient's score is based upon: CHF History: 1 HTN History: 1 Diabetes History: 1 Stroke History: 0 Vascular Disease History: 1 Age Score: 2 Gender Score: 0  For questions or updates, please contact [place] consult [email or web address] for contact info under  Signed, LinReino BellisP  07/05/2022 11:40 AM  Patient seen and examined, note reviewed with the signed Advanced Practice Provider. I personally  reviewed laboratory data, imaging studies and relevant notes. I independently examined the patient and formulated the important aspects of the plan. I have personally discussed the plan with the patient and/or family. Comments or changes to the note/plan are indicated below.  Patient was seen examined by his bedside. He was sitting up when I arrived.  However no complaints at this time.  Based on discussion and his functional capacity he certainly is not able to perform METS >4 and does have coronary artery disease with ischemic cardiomyopathy. Currently he does not appear to be decompensated-no concern for any anginal symptoms at this time We will reevaluate his left ventricular systolic function with an echocardiogram.  The patient does not have any unstable cardiac conditions.  Upon evaluation today, he does not have any apparent anginal symptoms.  Based on his clinical assessment he requires no further cardiac workup prior to his noncardiac surgery.  He is at high risk for adverse intraoperative cardiovascular events with this high risk surgery.   We will continue to follow with you.  KarBerniece Salines, MS FACPeninsula Eye Surgery Center LLCtending Cardiologist [place] [place] [phone]: [email or web address]

## 2022-07-05 NOTE — Progress Notes (Signed)
Mobility Specialist Progress Note:   07/05/22 1350  Mobility  Activity Ambulated with assistance in hallway  Level of Assistance Standby assist, set-up cues, supervision of patient - no hands on  Assistive Device Front wheel walker  Distance Ambulated (ft) 300 ft  Activity Response Tolerated well  $Mobility charge 1 Mobility   Pt received in bed wiling to participate in mobility. Complaints of both feet hurting. Left in bed with call bell in reach and all needs met.   [facility] Tyjay Galindo Mobility Specialist

## 2022-07-05 NOTE — Progress Notes (Signed)
  Echocardiogram 2D Echocardiogram has been performed.  Steve [name] 07/05/2022, 1:56 PM

## 2022-07-05 NOTE — Progress Notes (Signed)
Physical Therapy Treatment Patient Details Name: Steve [name]. MRN: [id number] DOB: [birth year] Today's Date: 07/05/2022   History of Present Illness 81 y.o. male presenting 06/25/22 with tremors. He reports that he felt bad today.  Some tremors; AKI, hyponatremia, sepsis of ?origin   PMH significant of CAD s/p CABG; HTN; HLD; DM; PAD; and afib    PT Comments    Pt received OOB in recliner on arrival and agreeable to session with continued progress towards acute goals. Session focused on gait training for increased activity tolerance and LE strength. Pt demonstrating ambulation with RW and min guard for safety without LOB and fair stability. Pt able to complete 5x STS without UE support with min assist to steady on rise. Pt able to come to standing without assist with use of UE on arms of recliner. Pt continues to benefit from skilled PT services to progress toward functional mobility goals.    Recommendations for follow up therapy are one component of a multi-disciplinary discharge planning process, led by the attending physician.  Recommendations may be updated based on patient status, additional functional criteria and insurance authorization.  Follow Up Recommendations  Home health PT     Assistance Recommended at Discharge PRN  Patient can return home with the following Assistance with cooking/housework;Assist for transportation;Help with stairs or ramp for entrance   Equipment Recommendations  None recommended by PT    Recommendations for Other Services       Precautions / Restrictions Precautions Precautions: Fall Restrictions Weight Bearing Restrictions: No     Mobility  Bed Mobility Overal bed mobility: Modified Independent             General bed mobility comments: pt OOB in recliner pre and post session    Transfers Overall transfer level: Needs assistance Equipment used: Rolling walker (2 wheels), None Transfers: Sit to/from Stand Sit to Stand: Min  guard, Min assist           General transfer comment: min guard for safety on initial stand, min assist for 5xSTS without use of UE to steady on rise    Ambulation/Gait Ambulation/Gait assistance: Min guard Gait Distance (Feet): 325 Feet Assistive device: Rolling walker (2 wheels), None Gait Pattern/deviations: Step-through pattern, Decreased stride length, Wide base of support       General Gait Details: min guard for safety, cues for pacing, no LOB, short bout of ambulation in room without DME with pt reaching for footboard on bed to stabilize   Stairs             Wheelchair Mobility    Modified Rankin (Stroke Patients Only)       Balance Overall balance assessment: Needs assistance Sitting-balance support: No upper extremity supported, Feet supported Sitting balance-Leahy Scale: Good     Standing balance support: Bilateral upper extremity supported, Reliant on assistive device for balance, No upper extremity supported Standing balance-Leahy Scale: Poor Standing balance comment: able to perfrom grooming tasks without no UE support at sink                            Cognition Arousal/Alertness: Awake/alert Behavior During Therapy: WFL for tasks assessed/performed Overall Cognitive Status: Within Functional Limits for tasks assessed  Exercises Other Exercises Other Exercises: STS x5 without UE support    General Comments General comments (skin integrity, edema, etc.): VSS on RA      Pertinent Vitals/Pain Pain Assessment Pain Assessment: Faces Faces Pain Scale: Hurts a little bit Pain Location: L foot Pain Descriptors / Indicators: Grimacing, Guarding Pain Intervention(s): Limited activity within patient's tolerance, Monitored during session    Home Living                          Prior Function            PT Goals (current goals can now be found in the care plan  section) Acute Rehab PT Goals PT Goal Formulation: With patient Time For Goal Achievement: 07/10/22    Frequency    Min 3X/week      PT Plan Current plan remains appropriate    Co-evaluation              AM-PAC PT "6 Clicks" Mobility   Outcome Measure  Help needed turning from your back to your side while in a flat bed without using bedrails?: None Help needed moving from lying on your back to sitting on the side of a flat bed without using bedrails?: None Help needed moving to and from a bed to a chair (including a wheelchair)?: A Little Help needed standing up from a chair using your arms (e.g., wheelchair or bedside chair)?: A Little Help needed to walk in hospital room?: A Little Help needed climbing 3-5 steps with a railing? : A Lot 6 Click Score: 19    End of Session Equipment Utilized During Treatment: Gait belt Activity Tolerance: Patient tolerated treatment well Patient left: in chair;with call bell/phone within reach;with chair alarm set Nurse Communication: Mobility status PT Visit Diagnosis: Difficulty in walking, not elsewhere classified (R26.2)     Time: 5537-4827 PT Time Calculation (min) (ACUTE ONLY): 16 min  Charges:  $Gait Training: 8-22 mins                     Audry Riles. PTA Acute Rehabilitation Services Office: [place] 07/05/2022, 9:56 AM

## 2022-07-05 NOTE — Progress Notes (Signed)
Mobility Specialist Progress Note:   07/05/22 1434  Mobility  Activity Ambulated with assistance to bathroom  Level of Assistance Standby assist, set-up cues, supervision of patient - no hands on  Assistive Device Front wheel walker  Distance Ambulated (ft) 15 ft  Activity Response Tolerated well  $Mobility charge 1 Mobility   Pt received in bathroom needing to get back to bed. No complaints of pain. Left EOB with call bell in reach and all needs met.   [facility] Valerio Pinard Mobility Specialist

## 2022-07-05 NOTE — Care Management Important Message (Signed)
Important Message  Patient Details  Name: Steve [name]. MRN: [id number] Date of Birth: 1941-05-14   Medicare Important Message Given:  Yes     [name] 07/05/2022, 9:30 AM

## 2022-07-05 NOTE — Progress Notes (Addendum)
  Progress Note    07/05/2022 7:28 AM 3 Days Post-Op  Subjective: some left foot pain, otherwise no complaints    Vitals:   07/05/22 0024 07/05/22 0253  BP: (!) 139/49 (!) 158/58  Pulse: 60 61  Resp: 18 17  Temp: 98 F (36.7 C) 98 F (36.7 C)  SpO2: 99% 97%    Physical Exam: Lungs:  nonlabored Incisions:  R groin cath access site without hematoma Extremities:  L foot wound unchanged  CBC    Component Value Date/Time   WBC 5.1 07/05/2022 0250   RBC 2.62 (L) 07/05/2022 0250   HGB 8.0 (L) 07/05/2022 0250   HGB 9.9 (L) 07/24/2018 1423   HCT 24.2 (L) 07/05/2022 0250   PLT 113 (L) 07/05/2022 0250   PLT 128 (L) 07/24/2018 1423   MCV 92.4 07/05/2022 0250   MCH 30.5 07/05/2022 0250   MCHC 33.1 07/05/2022 0250   RDW 15.1 07/05/2022 0250   LYMPHSABS 0.3 (L) 06/25/2022 1550   MONOABS 0.2 06/25/2022 1550   EOSABS 0.1 06/25/2022 1550   BASOSABS 0.0 06/25/2022 1550    BMET    Component Value Date/Time   NA 138 07/05/2022 0250   K 3.0 (L) 07/05/2022 0250   CL 111 07/05/2022 0250   CO2 19 (L) 07/05/2022 0250   GLUCOSE 118 (H) 07/05/2022 0250   BUN 12 07/05/2022 0250   CREATININE 1.49 (H) 07/05/2022 0250   CALCIUM 7.9 (L) 07/05/2022 0250   GFRNONAA 46 (L) 07/05/2022 0250   GFRAA 46 (L) 05/19/2020 1311    INR    Component Value Date/Time   INR 1.06 12/17/2018 1157     Intake/Output Summary (Last 24 hours) at 07/05/2022 0728 Last data filed at 07/05/2022 0500 Gross per 24 hour  Intake 820.6 ml  Output 1475 ml  Net -654.4 ml     Assessment/Plan:  81 y.o. male is 3 days post op, s/p: aortogram with LLE runoff   -L foot wound unchanged -Plan is L femoral-peroneal bypass on 9/13. Will contact cardiology today for preoperative risk assessment -Continue ASA, statin, heparin gtt. Continue to hold Eliquis  Vicente Serene, [place] [facility] [phone] 07/05/2022 7:28 AM  VASCULAR STAFF ADDENDUM: I have independently interviewed and  examined the patient. I agree with the above.  Will follow up cardiology recs and recent Echo Plan for bypass Wednesday pending clearance   Cassandria Santee, MD [facility] Phone Number: [phone] 07/05/2022 2:54 PM

## 2022-07-05 NOTE — Progress Notes (Signed)
Occupational Therapy Treatment Patient Details Name: Steve [name]. MRN: [id number] DOB: 26-Sep-1941 Today's Date: 07/05/2022   History of present illness 81 y.o. male presenting 06/25/22 with tremors. He reports that he felt bad today.  Some tremors; AKI, hyponatremia, sepsis of ?origin   PMH significant of CAD s/p CABG; HTN; HLD; DM; PAD; and afib   OT comments  Patient received in supine and asking to use bathroom. Patient was min guard to ambulate to bathroom and perform transfer. Patient performed self care tasks seated/standing at sink with min guard when standing and min assist to donn gown due to lines. Patient completed visit in recliner. Patient is making good progress with OT treatment and discharge recommendations continues to be appropriate.    Recommendations for follow up therapy are one component of a multi-disciplinary discharge planning process, led by the attending physician.  Recommendations may be updated based on patient status, additional functional criteria and insurance authorization.    Follow Up Recommendations  Home health OT    Assistance Recommended at Discharge Set up Supervision/Assistance  Patient can return home with the following  A little help with walking and/or transfers;A little help with bathing/dressing/bathroom;Direct supervision/assist for medications management;Direct supervision/assist for financial management;Help with stairs or ramp for entrance   Equipment Recommendations  None recommended by OT    Recommendations for Other Services      Precautions / Restrictions Precautions Precautions: Fall Restrictions Weight Bearing Restrictions: No       Mobility Bed Mobility Overal bed mobility: Modified Independent             General bed mobility comments: able to get OOB without assistance    Transfers Overall transfer level: Needs assistance Equipment used: Rolling walker (2 wheels) Transfers: Sit to/from Stand Sit to Stand:  Min guard           General transfer comment: min guard for safety     Balance Overall balance assessment: Needs assistance Sitting-balance support: No upper extremity supported, Feet supported Sitting balance-Leahy Scale: Good     Standing balance support: Bilateral upper extremity supported, Reliant on assistive device for balance, No upper extremity supported Standing balance-Leahy Scale: Poor Standing balance comment: able to perfrom grooming tasks without no UE support at sink                           ADL either performed or assessed with clinical judgement   ADL Overall ADL's : Needs assistance/impaired     Grooming: Wash/dry hands;Wash/dry face;Oral care;Supervision/safety;Standing Grooming Details (indicate cue type and reason): at sink Upper Body Bathing: Supervision/ safety;Sitting   Lower Body Bathing: Minimal assistance;Sitting/lateral leans;Sit to/from stand   Upper Body Dressing : Minimal assistance;Standing Upper Body Dressing Details (indicate cue type and reason): gown change, min assist due to lines Lower Body Dressing: Set up;Sitting/lateral leans Lower Body Dressing Details (indicate cue type and reason): donn ortho shoe on left foot Toilet Transfer: Min guard;Ambulation;Regular Glass blower/designer Details (indicate cue type and reason): ambulated to toilet           General ADL Comments: min guard for safety    Extremity/Trunk Assessment              Vision       Perception     Praxis      Cognition Arousal/Alertness: Awake/alert Behavior During Therapy: WFL for tasks assessed/performed Overall Cognitive Status: Within Functional Limits for tasks assessed  Exercises      Shoulder Instructions       General Comments      Pertinent Vitals/ Pain       Pain Assessment Pain Assessment: Faces Faces Pain Scale: Hurts a little bit Pain Location: L  foot Pain Descriptors / Indicators: Grimacing, Guarding Pain Intervention(s): Limited activity within patient's tolerance, Monitored during session, Repositioned  Home Living                                          Prior Functioning/Environment              Frequency  Min 2X/week        Progress Toward Goals  OT Goals(current goals can now be found in the care plan section)  Progress towards OT goals: Progressing toward goals  Acute Rehab OT Goals Patient Stated Goal: get better OT Goal Formulation: With patient Time For Goal Achievement: 07/10/22 Potential to Achieve Goals: Good ADL Goals Pt Will Perform Grooming: with modified independence;standing Pt Will Perform Lower Body Bathing: with modified independence;sitting/lateral leans;sit to/from stand Pt Will Perform Lower Body Dressing: with modified independence;sitting/lateral leans;sit to/from stand Pt Will Transfer to Toilet: with modified independence;ambulating Pt Will Perform Toileting - Clothing Manipulation and hygiene: with modified independence;sitting/lateral leans;sit to/from stand  Plan Discharge plan remains appropriate    Co-evaluation                 AM-PAC OT "6 Clicks" Daily Activity     Outcome Measure   Help from another person eating meals?: None Help from another person taking care of personal grooming?: A Little Help from another person toileting, which includes using toliet, bedpan, or urinal?: A Little Help from another person bathing (including washing, rinsing, drying)?: A Little Help from another person to put on and taking off regular upper body clothing?: None Help from another person to put on and taking off regular lower body clothing?: A Little 6 Click Score: 20    End of Session Equipment Utilized During Treatment: Rolling walker (2 wheels)  OT Visit Diagnosis: Unsteadiness on feet (R26.81);Other abnormalities of gait and mobility (R26.89);Muscle  weakness (generalized) (M62.81)   Activity Tolerance Patient tolerated treatment well   Patient Left in chair;with call bell/phone within reach;with chair alarm set   Nurse Communication Mobility status        Time: 6754-4920 OT Time Calculation (min): 23 min  Charges: OT General Charges $OT Visit: 1 Visit OT Treatments $Self Care/Home Management : 23-37 mins  Lodema Hong, [place]  Office [place] 07/05/2022, 9:07 AM

## 2022-07-06 DIAGNOSIS — I10 Essential (primary) hypertension: Secondary | ICD-10-CM | POA: Diagnosis not present

## 2022-07-06 DIAGNOSIS — A419 Sepsis, unspecified organism: Secondary | ICD-10-CM | POA: Diagnosis not present

## 2022-07-06 DIAGNOSIS — I2510 Atherosclerotic heart disease of native coronary artery without angina pectoris: Secondary | ICD-10-CM | POA: Diagnosis not present

## 2022-07-06 DIAGNOSIS — I5020 Unspecified systolic (congestive) heart failure: Secondary | ICD-10-CM | POA: Diagnosis not present

## 2022-07-06 LAB — CBC
HCT: 23.6 % — ABNORMAL LOW (ref 39.0–52.0)
Hemoglobin: 7.8 g/dL — ABNORMAL LOW (ref 13.0–17.0)
MCH: 31.3 pg (ref 26.0–34.0)
MCHC: 33.1 g/dL (ref 30.0–36.0)
MCV: 94.8 fL (ref 80.0–100.0)
Platelets: 108 10*3/uL — ABNORMAL LOW (ref 150–400)
RBC: 2.49 MIL/uL — ABNORMAL LOW (ref 4.22–5.81)
RDW: 15.6 % — ABNORMAL HIGH (ref 11.5–15.5)
WBC: 4.7 10*3/uL (ref 4.0–10.5)
nRBC: 0 % (ref 0.0–0.2)

## 2022-07-06 LAB — RENAL FUNCTION PANEL
Albumin: 2.5 g/dL — ABNORMAL LOW (ref 3.5–5.0)
Anion gap: 5 (ref 5–15)
BUN: 10 mg/dL (ref 8–23)
CO2: 21 mmol/L — ABNORMAL LOW (ref 22–32)
Calcium: 8.1 mg/dL — ABNORMAL LOW (ref 8.9–10.3)
Chloride: 113 mmol/L — ABNORMAL HIGH (ref 98–111)
Creatinine, Ser: 1.45 mg/dL — ABNORMAL HIGH (ref 0.61–1.24)
GFR, Estimated: 48 mL/min — ABNORMAL LOW (ref >60)
Glucose, Bld: 121 mg/dL — ABNORMAL HIGH (ref 70–99)
Phosphorus: 3.7 mg/dL (ref 2.5–4.6)
Potassium: 3.7 mmol/L (ref 3.5–5.1)
Sodium: 139 mmol/L (ref 135–145)

## 2022-07-06 LAB — GLUCOSE, CAPILLARY
Glucose-Capillary: 125 mg/dL — ABNORMAL HIGH (ref 70–99)
Glucose-Capillary: 135 mg/dL — ABNORMAL HIGH (ref 70–99)
Glucose-Capillary: 155 mg/dL — ABNORMAL HIGH (ref 70–99)
Glucose-Capillary: 120 mg/dL — ABNORMAL HIGH (ref 70–99)

## 2022-07-06 LAB — HEPARIN LEVEL (UNFRACTIONATED): Heparin Unfractionated: 0.36 IU/mL (ref 0.30–0.70)

## 2022-07-06 NOTE — Progress Notes (Signed)
Mobility Specialist Progress Note:   07/06/22 0847  Mobility  Activity Ambulated with assistance in hallway  Level of Assistance Standby assist, set-up cues, supervision of patient - no hands on  Assistive Device Front wheel walker  Distance Ambulated (ft) 200 ft  Activity Response Tolerated well  $Mobility charge 1 Mobility   Pt received in bed willing to participate in mobility. No complaints of pain. Left in bed with call bell in reach and all needs met.   [facility] Surveyor, mining Chat only

## 2022-07-06 NOTE — Progress Notes (Signed)
Pharmacy Antibiotic Note  Steve [name]. is a 81 y.o. male admitted on 06/25/2022 with left foot wound with concerns for osteomyelitis. Pharmacy has been consulted for Cefazolin dosing. Pt is afebrile. WBC wnl. Scr relatively stable likely at baseline.   Plan: Continue cefazolin 2g IV Q8h Trend WBC, fever, renal function F/u surgical plans and LOT (planning for R femoral to peroneal bypass 9/13)   Height: 5\' 9"  (175.3 cm) Weight: 85.5 kg (188 lb 6.4 oz) IBW/kg (Calculated) : 70.7  Temp (24hrs), Avg:98.3 F (36.8 C), Min:97.5 F (36.4 C), Max:98.6 F (37 C)  Recent Labs  Lab 07/01/22 0620 07/02/22 0917 07/03/22 0607 07/04/22 0341 07/05/22 0250 07/06/22 0354  WBC 5.7 5.6 7.0 5.4 5.1 4.7  CREATININE 1.38* 1.36* 1.44*  --  1.49* 1.45*     Estimated Creatinine Clearance: 41.8 mL/min (A) (by C-G formula based on SCr of 1.45 mg/dL (H)).    Allergies  Allergen Reactions   Januvia [Sitagliptin] Other (See Comments)    Soreness to stomach area. Intolerance    Statins Other (See Comments)    Myalgias, muscle weakness, swelling, pain    Antimicrobials this admission: Cefepime 9/1 >> 9/05 Metronidazole 9/1 >> 9/4 Vancomycin 9/1 >> 9/2 Cefazolin 9/6 >>   Microbiology results: 9/1 BCx: NG final 9/1 UCx: NG final   Thank you for allowing [place] to participate in this patients care. Jens Som, PharmD 07/06/2022 4:37 PM  **Pharmacist phone directory can be found on [email or web address] listed under [place]**

## 2022-07-06 NOTE — Progress Notes (Signed)
PT Cancellation Note  Patient Details Name: Steve [name]. MRN: [id number] DOB: 11-14-1940   Cancelled Treatment:    Reason Eval/Treat Not Completed: Patient declined, no reason specified, pt declining all mobility and exercise despite max encouragement and education on benefits. Will continue to follow acutely.   Audry Riles. PTA Acute Rehabilitation Services Office: [place] 07/06/2022, 3:57 PM

## 2022-07-06 NOTE — Progress Notes (Signed)
Rounding Note    Patient Name: Steve [name]. Date of Encounter: 07/06/2022  [facility] Cardiologist: Peter Martinique, MD   Subjective   Patient was seen and examined at his bedside. He was awake eating breakfast.  Inpatient Medications    Scheduled Meds:  aspirin EC  81 mg Oral Daily   atorvastatin  40 mg Oral Daily   Chlorhexidine Gluconate Cloth  6 each Topical Daily   docusate sodium  100 mg Oral BID   doxazosin  4 mg Oral Daily   ezetimibe  10 mg Oral Daily   feeding supplement  237 mL Oral TID BM   gabapentin  100 mg Oral QHS   hydrALAZINE  25 mg Oral Q8H   insulin aspart  0-15 Units Subcutaneous TID WC   insulin aspart  0-5 Units Subcutaneous QHS   insulin detemir  15 Units Subcutaneous Daily   isosorbide mononitrate  30 mg Oral Daily   metoprolol succinate  100 mg Oral Q supper   multivitamin with minerals  1 tablet Oral Daily   sodium chloride flush  3 mL Intravenous Q12H   sodium chloride flush  3 mL Intravenous Q12H   Continuous Infusions:  sodium chloride      ceFAZolin (ANCEF) IV 2 g (07/06/22 0648)   heparin 1,750 Units/hr (07/05/22 1701)   lactated ringers Stopped (07/05/22 0800)   lactated ringers 125 mL/hr at 07/05/22 1702   PRN Meds: sodium chloride, acetaminophen **OR** acetaminophen, albuterol, bisacodyl, cyclobenzaprine, hydrALAZINE, labetalol, lidocaine, LORazepam **OR** LORazepam, ondansetron **OR** ondansetron (ZOFRAN) IV, oxyCODONE, polyethylene glycol, sodium chloride flush   Vital Signs    Vitals:   07/05/22 2325 07/06/22 0401 07/06/22 0632 07/06/22 0745  BP: (!) 152/67 (!) 141/50  (!) 146/57  Pulse: 69 62  66  Resp: 19 20  13   Temp: 98.4 F (36.9 C) 98.3 F (36.8 C)  98.4 F (36.9 C)  TempSrc: Oral Oral  Oral  SpO2: 99% 98%  99%  Weight:   85.5 kg   Height:        Intake/Output Summary (Last 24 hours) at 07/06/2022 0900 Last data filed at 07/06/2022 0810 Gross per 24 hour  Intake 2791.39 ml  Output 3100 ml  Net  -308.61 ml      07/06/2022    6:32 AM 06/26/2022    1:55 AM 06/25/2022    7:00 PM  Last 3 Weights  Weight (lbs) 188 lb 6.4 oz 193 lb 9 oz 209 lb  Weight (kg) 85.458 kg 87.8 kg 94.802 kg      Telemetry     - Personally Reviewed  ECG    None today  - Personally Reviewed  Physical Exam   GEN: No acute distress.   Neck: No JVD Cardiac: RRR, no murmurs, rubs, or gallops.  Respiratory: Clear to auscultation bilaterally. GI: Soft, nontender, non-distended  MS: No edema; No deformity. Neuro:  Nonfocal  Psych: Normal affect   Labs    High Sensitivity Troponin:  No results for input(s): "TROPONINIHS" in the last 720 hours.   Chemistry Recent Labs  Lab 07/01/22 0620 07/02/22 0917 07/03/22 0607 07/05/22 0250 07/06/22 0354  NA 138   < > 138 138 139  K 2.8*   < > 3.5 3.0* 3.7  CL 108   < > 109 111 113*  CO2 22   < > 18* 19* 21*  GLUCOSE 126*   < > 144* 118* 121*  BUN 13   < > 14  12 10  CREATININE 1.38*   < > 1.44* 1.49* 1.45*  CALCIUM 8.5*   < > 8.4* 7.9* 8.1*  MG 1.8  --   --   --   --   ALBUMIN 2.3*   < > 2.5* 2.5* 2.5*  GFRNONAA 51*   < > 48* 46* 48*  ANIONGAP 8   < > 11 8 5    < > = values in this interval not displayed.    Lipids  Recent Labs  Lab 07/03/22 0607  CHOL 98  TRIG 170*  HDL 17*  LDLCALC 47  CHOLHDL 5.8    Hematology Recent Labs  Lab 07/04/22 0341 07/05/22 0250 07/06/22 0354  WBC 5.4 5.1 4.7  RBC 2.62* 2.62* 2.49*  HGB 8.1* 8.0* 7.8*  HCT 23.8* 24.2* 23.6*  MCV 90.8 92.4 94.8  MCH 30.9 30.5 31.3  MCHC 34.0 33.1 33.1  RDW 14.8 15.1 15.6*  PLT 110* 113* 108*   Thyroid No results for input(s): "TSH", "FREET4" in the last 168 hours.  BNPNo results for input(s): "BNP", "PROBNP" in the last 168 hours.  DDimer No results for input(s): "DDIMER" in the last 168 hours.   Radiology    ECHOCARDIOGRAM COMPLETE  Result Date: 07/05/2022    ECHOCARDIOGRAM REPORT   Patient Name:   [name]. Date of Exam: 07/05/2022 Medical Rec #:  [id number]         Height:       69.0 in Accession #:    [id number]       Weight:       193.6 lb Date of Birth:  [birth year]         BSA:          2.037 m Patient Age:    26 years         BP:           142/50 mmHg Patient Gender: M                HR:           60 bpm. Exam Location:  Inpatient Procedure: 2D Echo, Cardiac Doppler and Color Doppler Indications:    Preoperative evaluation  History:        Patient has prior history of Echocardiogram examinations, most                 recent 12/15/2018. Cardiomyopathy, CAD, Prior CABG,                 Arrythmias:RBBB and Atrial Fibrillation; Risk Factors:Diabetes,                 Dyslipidemia and Hypertension.  Sonographer:    Eartha Inch Referring Phys: Reino Bellis, B  Sonographer Comments: Image acquisition challenging due to patient body habitus. IMPRESSIONS  1. Left ventricular ejection fraction, by estimation, is 35 to 40%. The left ventricle has moderately decreased function. The left ventricle demonstrates global hypokinesis. The left ventricular internal cavity size was mildly dilated. Left ventricular diastolic parameters are consistent with Grade II diastolic dysfunction (pseudonormalization). Elevated left atrial pressure.  2. Right ventricular systolic function is normal. The right ventricular size is normal. There is normal pulmonary artery systolic pressure. The estimated right ventricular systolic pressure is 76.1 mmHg.  3. Left atrial size was moderately dilated.  4. Secondary ventricular functional mitral regurgitation with regurgitant volume 49 mL. Systolic blunting of the right sided pulmonary vein systolic flow. The mitral valve is normal in structure. Moderate mitral  valve regurgitation. No evidence of mitral stenosis.  5. The aortic valve is tricuspid. Aortic valve regurgitation is not visualized. No aortic stenosis is present. Comparison(s): Prior images reviewed side by side. Slight increase in mitral regurgitation. FINDINGS  Left Ventricle: Left  ventricular ejection fraction, by estimation, is 35 to 40%. The left ventricle has moderately decreased function. The left ventricle demonstrates global hypokinesis. The left ventricular internal cavity size was mildly dilated. There is no left ventricular hypertrophy. Left ventricular diastolic parameters are consistent with Grade II diastolic dysfunction (pseudonormalization). Elevated left atrial pressure. Right Ventricle: The right ventricular size is normal. No increase in right ventricular wall thickness. Right ventricular systolic function is normal. There is normal pulmonary artery systolic pressure. The tricuspid regurgitant velocity is 2.23 m/s, and  with an assumed right atrial pressure of 3 mmHg, the estimated right ventricular systolic pressure is 64.1 mmHg. Left Atrium: Left atrial size was moderately dilated. Right Atrium: Right atrial size was normal in size. Pericardium: There is no evidence of pericardial effusion. Mitral Valve: Secondary ventricular functional mitral regurgitation with regurgitant volume 49 mL. Systolic blunting of the right sided pulmonary vein systolic flow. The mitral valve is normal in structure. Moderate mitral valve regurgitation. No evidence of mitral valve stenosis. Tricuspid Valve: The tricuspid valve is normal in structure. Tricuspid valve regurgitation is mild . No evidence of tricuspid stenosis. Aortic Valve: The aortic valve is tricuspid. Aortic valve regurgitation is not visualized. No aortic stenosis is present. Pulmonic Valve: The pulmonic valve was normal in structure. Pulmonic valve regurgitation is mild. No evidence of pulmonic stenosis. Aorta: The aortic root and ascending aorta are structurally normal, with no evidence of dilitation. IAS/Shunts: No atrial level shunt detected by color flow Doppler.  LEFT VENTRICLE PLAX 2D LVIDd:         6.00 cm      Diastology LVIDs:         4.90 cm      LV e' medial:    5.22 cm/s LV PW:         1.00 cm      LV E/e' medial:   21.6 LV IVS:        1.00 cm      LV e' lateral:   3.57 cm/s LVOT diam:     2.40 cm      LV E/e' lateral: 31.7 LV SV:         124 LV SV Index:   61 LVOT Area:     4.52 cm  LV Volumes (MOD) LV vol d, MOD A2C: 155.0 ml LV vol d, MOD A4C: 168.0 ml LV vol s, MOD A2C: 96.0 ml LV vol s, MOD A4C: 111.0 ml LV SV MOD A2C:     59.0 ml LV SV MOD A4C:     168.0 ml LV SV MOD BP:      58.5 ml RIGHT VENTRICLE RV S prime:     9.48 cm/s TAPSE (M-mode): 1.6 cm LEFT ATRIUM             Index        RIGHT ATRIUM           Index LA diam:        5.30 cm 2.60 cm/m   RA Area:     13.50 cm LA Vol (A2C):   77.5 ml 38.04 ml/m  RA Volume:   29.10 ml  14.28 ml/m LA Vol (A4C):   82.4 ml 40.44 ml/m LA Biplane Vol: 80.6 ml  39.56 ml/m  AORTIC VALVE LVOT Vmax:   105.00 cm/s LVOT Vmean:  76.100 cm/s LVOT VTI:    0.275 m  AORTA Ao Root diam: 3.30 cm Ao Asc diam:  3.90 cm MITRAL VALVE                  TRICUSPID VALVE MV Area (PHT): 3.50 cm       TR Peak grad:   19.9 mmHg MV Decel Time: 217 msec       TR Vmax:        223.00 cm/s MR Peak grad:    110.2 mmHg MR Mean grad:    74.0 mmHg    SHUNTS MR Vmax:         525.00 cm/s  Systemic VTI:  0.28 m MR Vmean:        402.0 cm/s   Systemic Diam: 2.40 cm MR PISA:         3.08 cm MR PISA Eff ROA: 22 mm MR PISA Radius:  0.70 cm MV E velocity: 113.00 cm/s MV A velocity: 84.60 cm/s MV E/A ratio:  1.34 Rudean Haskell MD Electronically signed by Rudean Haskell MD Signature Date/Time: 07/05/2022/2:35:17 PM    Final    DG Ankle Right Port  Result Date: 07/04/2022 CLINICAL DATA:  Right ankle pain EXAM: PORTABLE RIGHT ANKLE - 2 VIEW COMPARISON:  12/01/2021 FINDINGS: There is no evidence of fracture, dislocation, or joint effusion. There is no evidence of arthropathy or other focal bone abnormality. No focal soft tissue swelling. Advanced atherosclerotic vascular calcifications. IMPRESSION: Negative. Electronically Signed   By: Davina Poke D.O.   On: 07/04/2022 13:08    Cardiac Studies    Echo: 11/2018   IMPRESSIONS     1. The left ventricle has moderately reduced systolic function, with an  ejection fraction of 35-40%. The cavity size was mildly dilated. Left  ventricular diastolic Doppler parameters are consistent with impaired  relaxation.   2. Moderate hypokinesis of the basal left ventricular inferoseptal wall.   3. The right ventricle has normal systolic function. The cavity was  normal. There is no increase in right ventricular wall thickness. Right  ventricular systolic pressure could not be assessed.   4. The mitral valve is normal in structure. Mild calcification of the  mitral valve leaflet. There is mild mitral annular calcification present.  Mitral valve regurgitation is mild to moderate by color flow Doppler.   5. The tricuspid valve is normal in structure.   6. The aortic valve is tricuspid.   7. The aortic root is normal in size and structure.   8. There is mild dilatation of the ascending aorta.   FINDINGS   Left Ventricle: The left ventricle has moderately reduced systolic  function, with an ejection fraction of 35-40%. The cavity size was mildly  dilated. There is borderline increase in left ventricular wall thickness.  Left ventricular diastolic Doppler  parameters are consistent with impaired relaxation Moderate hypokinesis of  the left ventricular inferoseptal wall.  Right Ventricle: The right ventricle has normal systolic function. The  cavity was normal. There is no increase in right ventricular wall  thickness. Right ventricular systolic pressure could not be assessed.  Left Atrium: left atrial size was normal in size  Right Atrium: right atrial size was normal in size.  Interatrial Septum: No atrial level shunt detected by color flow Doppler.  Pericardium: There is no evidence of pericardial effusion.  Mitral Valve: The mitral valve is normal in structure. Mild  calcification  of the mitral valve leaflet. There is mild mitral annular  calcification  present. Mitral valve regurgitation is mild to moderate by color flow  Doppler.  Tricuspid Valve: The tricuspid valve is normal in structure. Tricuspid  valve regurgitation is trivial by color flow Doppler.  Aortic Valve: The aortic valve is tricuspid Aortic valve regurgitation was  not visualized by color flow Doppler.  Pulmonic Valve: The pulmonic valve was grossly normal. Pulmonic valve  regurgitation is trivial by color flow Doppler.  Aorta: The aortic root is normal in size and structure. There is mild  dilatation of the ascending aorta.  Venous: The inferior vena cava was not well visualized.   Patient Profile     81 y.o. 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 He is aware of his procedure tomorrow morning-left femoral-femoral arterial bypass with Dr. Stanford Breed. Continue patient on his heparin drip. Continue current medication regimen for hypertension.  The patient does not have any unstable cardiac conditions.  Upon evaluation today, he does not have any apparent anginal symptoms.  Based on his clinical assessment he requires no further cardiac workup prior to his noncardiac surgery.  He is at high risk for adverse intraoperative cardiovascular events with this high risk surgery.   We will continue to follow with you.  For questions or updates, please contact [place] Please consult [email or web address] for contact info under        Signed, Dontavian Marchi, DO  07/06/2022, 9:00 AM

## 2022-07-06 NOTE — Progress Notes (Signed)
Progress Note Patient: [name] Scoles Jr. MRN:[id number] DOB: 02/16/1941 DOA: 06/25/2022  DOS: the patient was seen and examined on 07/06/2022  Brief hospital course: [name] Shain Jr. is a 81 y.o. male with a history of CAD s/p CABG, hypertension, hyperlipidemia, diabetes mellitus type 2, PAD, atrial fibrillation. Patient presented secondary to tremors and was found to have fevers with concern for possible infection. SIRS criteria met without source identified. Patient started empirically on Vancomycin, Cefepime and Flagyl. Blood and urine cultures obtained. MRI significant for osteomyelitis. Podiatry and vascular surgery consulted. S/P angiogram on 9/8.  Recommendation is for femoropopliteal bypass currently scheduled on 9/13.  Will remain on IV heparin until then. 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  Associated with cellulitis. Osteomyelitis of the fifth metatarsal head/neck in addition to early osteomyelitis of the fifth digit proximal phalanx on MRI from 9/3. Blood and urine cultures are no growth.  Empiric Vancomycin, Cefepime and Flagyl initiated on admission and patient has been deescalated to Cefepime.   As of 9/6 now on cefazolin. -Continue cefazolin  -We will discuss with ID after vascular and podiatry procedure with regards to type and duration of the antibiotic treatment Patient does not want to lose his foot.  AKI on CKD stage IIIa Patient's baseline creatinine appears to be around 1.2. Creatinine of 2.47 on admission. Treated with IV fluids. Mild worsening after angiography. Monitor.   Hyponatremia Sodium of 126 on admission. Likely related to dehydration.  Resolved    Acute urinary retention Unsure of etiology.  Patient appears to be asymptomatic.  Urinalysis is not suggestive of infection. Patient with continued urinary retention -Foley catheter inserted on 9/3.   Attempt voiding trial on 9/14 after bypass surgery. -Outpatient urology follow-up   Constipation Resolved.  Continue current bowel regimen.   Diabetes mellitus type 2 uncontrolled with hyperglycemia with nonhealing wounds. Patient is on Januvia, Levemir and metformin as an outpatient. Most recent hemoglobin A1C is 7.9%. -Continue Levemir and SSI   CAD Hyperlipidemia PAD No chest pain. Stable. Continue Zetia, on Repatha outpatient. ABI significantly abnormal.  Angiography shows evidence of significant stenotic disease.   Primary hypertension -Continue Imdur and Toprol XL   Paroxysmal atrial fibrillation -Patient was on Eliquis and Toprol XL Currently on IV heparin.   Thrombocytopenia No current bleeding. stable.   Acute encephalopathy. Mostly delirium Etiology not clear  most likely combination of prolonged hospitalization and medication side effect.  Unable to differentiate further whether metabolic or toxic. Now resolved. Monitor.   Prolonged QTc. QTc is 501. Monitor for now.  Avoid QT prolonging medications.  Right ankle pain. Patient reports that his right ankle is also hurting significantly for last few days. X-ray ankle negative for any acute abnormality.  Cardiology was consulted for preop clearance.  Currently no work-up recommended.  Patient has no prohibitive risk for surgery.  Subjective: Right ankle is his main pain.  No nausea no vomiting no fever no chills.  Physical Exam: Vitals:   07/06/22 0632 07/06/22 0745 07/06/22 1121 07/06/22 1629  BP:  (!) 146/57 (!) 159/61 (!) 157/59  Pulse:  66 60 64  Resp:  13 19 20  Temp:  98.4 F (36.9 C) 98.6 F (37 C) 98.5 F (36.9 C)  TempSrc:  Oral Oral Oral  SpO2:  99% 99% 98%  Weight: 85.5 kg     Height:       General: Appear in mild distress;   no visible Abnormal Neck Mass Or lumps, Conjunctiva normal Cardiovascular: S1 and S2 Present, aortic systolic Murmur, Respiratory: good respiratory effort, Bilateral  Air entry present and CTA, no Crackles, no wheezes Abdomen: Bowel Sound present, Non tender Extremities: no Pedal edema Neurology: alert and oriented to Self, Place and time.   Data Reviewed: I have Reviewed nursing notes, Vitals, and Lab results since pt's last encounter. Pertinent lab results BMP I have ordered test including CBC and BMP    Family Communication: No one at bedside, discussed with son on the phone.  Disposition: Status is: Inpatient Remains inpatient appropriate because: Scheduled for procedure on Wednesday.  Monitor postop course.  Author: Berle Mull, MD 07/06/2022 7:54 PM  Please look on [email or web address] to find out who is on call.

## 2022-07-06 NOTE — Anesthesia Preprocedure Evaluation (Addendum)
Anesthesia Evaluation  Patient identified by MRN, date of birth, ID band Patient awake    Reviewed: Allergy & Precautions, NPO status , Patient's Chart, lab work & pertinent test results  History of Anesthesia Complications Negative for: history of anesthetic complications  Airway Mallampati: II  TM Distance: >3 FB Neck ROM: Full    Dental  (+) Edentulous Lower, Edentulous Upper   Pulmonary former smoker,    Pulmonary exam normal        Cardiovascular hypertension, + CAD, + CABG (2013) and + Peripheral Vascular Disease  Normal cardiovascular exam+ dysrhythmias (on Eliquis) Atrial Fibrillation   TTE 07/05/22: EF 35-40%, global hypokinesis, mild LVE, grade II DD, moderate LAE, moderate MR    Neuro/Psych negative neurological ROS  negative psych ROS   GI/Hepatic negative GI ROS, Neg liver ROS,   Endo/Other  diabetes, Poorly Controlled, Type 2, Insulin Dependent, Oral Hypoglycemic Agents  Renal/GU Renal Insufficiency and ARFRenal disease (Cr 1.45)  negative genitourinary   Musculoskeletal negative musculoskeletal ROS (+)   Abdominal   Peds  Hematology  (+) Blood dyscrasia (Hgb 7.8, Plt 108k), anemia ,   Anesthesia Other Findings Day of surgery medications reviewed with patient.  Reproductive/Obstetrics negative OB ROS                            Anesthesia Physical Anesthesia Plan  ASA: 4  Anesthesia Plan: General   Post-op Pain Management: Tylenol PO (pre-op)*   Induction: Intravenous  PONV Risk Score and Plan: 2 and Treatment may vary due to age or medical condition, Ondansetron and Dexamethasone  Airway Management Planned: Oral ETT  Additional Equipment: Arterial line  Intra-op Plan:   Post-operative Plan: Extubation in OR  Informed Consent: I have reviewed the patients History and Physical, chart, labs and discussed the procedure including the risks, benefits and alternatives  for the proposed anesthesia with the patient or authorized representative who has indicated his/her understanding and acceptance.     Dental advisory given  Plan Discussed with: CRNA  Anesthesia Plan Comments:        Anesthesia Quick Evaluation

## 2022-07-06 NOTE — Progress Notes (Signed)
ANTICOAGULATION CONSULT NOTE  Pharmacy Consult for Heparin Indication: atrial fibrillation  Allergies  Allergen Reactions   Januvia [Sitagliptin] Other (See Comments)    Soreness to stomach area. Intolerance    Statins Other (See Comments)    Myalgias, muscle weakness, swelling, pain    Patient Measurements: Height: 5\' 9"  (175.3 cm) Weight: 85.5 kg (188 lb 6.4 oz) IBW/kg (Calculated) : 70.7  Heparin Dosing Weight: 87 kg  Vital Signs: Temp: 98.4 F (36.9 C) (09/12 0745) Temp Source: Oral (09/12 0745) BP: 146/57 (09/12 0745) Pulse Rate: 66 (09/12 0745)  Labs: Recent Labs    07/04/22 0341 07/05/22 0250 07/06/22 0354  HGB 8.1* 8.0* 7.8*  HCT 23.8* 24.2* 23.6*  PLT 110* 113* 108*  HEPARINUNFRC 0.43 0.46 0.36  CREATININE  --  1.49* 1.45*     Estimated Creatinine Clearance: 41.8 mL/min (A) (by C-G formula based on SCr of 1.45 mg/dL (H)).   Assessment: 81 yo male admitted for left foot wound infection on apixaban PTA for afib. Apixaban now held for pending arteriography +/- revascularization on Friday. Last dose apixaban 9/5. Pharmacy consulted to manage IV heparin while apixaban on hold.  9/8 Heparin stopped for arteriography - okay to resume heparin 8 hours after sheath removal (documented on @1315 ).   Heparin level 0.36 therapeutic on 1750 units/hr.  Hgb low-stable at 7.8. Plt 108, stable.   No issues with infusion or bleeding noted.   Goal of Therapy:  Heparin level 0.3-0.7 units/ml aPTT 66-102 seconds Monitor platelets by anticoagulation protocol: Yes   Plan:  Continue heparin infusion at 1750 units/hr  Continue daily anti-Xa level, CBC, and monitor for any signs or symptoms of bleeding F/u plans for fem peroneal artery bypass (planned for 9/13 @1145 ) and transition back to Eliquis    Thank you for allowing [place] to participate in this patients care. Jens Som, PharmD 07/06/2022 9:07 AM  **Pharmacist phone directory can be found on [email or web address] listed  under [place]**

## 2022-07-06 NOTE — Progress Notes (Addendum)
  Progress Note    07/06/2022 7:26 AM 4 Days Post-Op  Subjective:  no complaints   Vitals:   07/05/22 2325 07/06/22 0401  BP: (!) 152/67 (!) 141/50  Pulse: 69 62  Resp: 19 20  Temp: 98.4 F (36.9 C) 98.3 F (36.8 C)  SpO2: 99% 98%   Physical Exam: Cardiac:  regular Lungs:  non labored Extremities:  left foot wound unchanged Neurologic: alert and oriented   CBC    Component Value Date/Time   WBC 4.7 07/06/2022 0354   RBC 2.49 (L) 07/06/2022 0354   HGB 7.8 (L) 07/06/2022 0354   HGB 9.9 (L) 07/24/2018 1423   HCT 23.6 (L) 07/06/2022 0354   PLT 108 (L) 07/06/2022 0354   PLT 128 (L) 07/24/2018 1423   MCV 94.8 07/06/2022 0354   MCH 31.3 07/06/2022 0354   MCHC 33.1 07/06/2022 0354   RDW 15.6 (H) 07/06/2022 0354   LYMPHSABS 0.3 (L) 06/25/2022 1550   MONOABS 0.2 06/25/2022 1550   EOSABS 0.1 06/25/2022 1550   BASOSABS 0.0 06/25/2022 1550    BMET    Component Value Date/Time   NA 139 07/06/2022 0354   K 3.7 07/06/2022 0354   CL 113 (H) 07/06/2022 0354   CO2 21 (L) 07/06/2022 0354   GLUCOSE 121 (H) 07/06/2022 0354   BUN 10 07/06/2022 0354   CREATININE 1.45 (H) 07/06/2022 0354   CALCIUM 8.1 (L) 07/06/2022 0354   GFRNONAA 48 (L) 07/06/2022 0354   GFRAA 46 (L) 05/19/2020 1311    INR    Component Value Date/Time   INR 1.06 12/17/2018 1157     Intake/Output Summary (Last 24 hours) at 07/06/2022 0726 Last data filed at 07/06/2022 0444 Gross per 24 hour  Intake 3031.39 ml  Output 3300 ml  Net -268.61 ml     Assessment/Plan:  81 y.o. male is s/p atherosclerosis of lle with non healing wound s/p Aortogram of LLE 4 Days Post-Op   Left foot wound unchanged Appreciate cardiology evaluation Cleared for surgery tomorrow per Cards Discussed with patient and he has no questions NPO after midnight Consent in [facility], PA-C [facility] [phone] 07/06/2022 7:26 AM  VASCULAR STAFF ADDENDUM: I have independently interviewed and  examined the patient. I agree with the above.  OR tomorrow for high-risk femoral to peroneal bypass with PTFE  Cassandria Santee, MD [facility] of [facility] Phone Number: [phone] 07/06/2022 11:40 AM

## 2022-07-07 ENCOUNTER — Other Ambulatory Visit: Payer: Self-pay

## 2022-07-07 ENCOUNTER — Encounter (HOSPITAL_COMMUNITY)
Admission: EM | Disposition: A | Discharge status: Home Health | Payer: Self-pay | Source: Home / Self Care | Attending: Internal Medicine

## 2022-07-07 ENCOUNTER — Inpatient Hospital Stay (HOSPITAL_COMMUNITY): Payer: Medicare Other | Admitting: Anesthesiology

## 2022-07-07 ENCOUNTER — Encounter (HOSPITAL_COMMUNITY): Payer: Self-pay | Admitting: Internal Medicine

## 2022-07-07 DIAGNOSIS — Z87891 Personal history of nicotine dependence: Secondary | ICD-10-CM

## 2022-07-07 DIAGNOSIS — A419 Sepsis, unspecified organism: Secondary | ICD-10-CM | POA: Diagnosis not present

## 2022-07-07 DIAGNOSIS — I70248 Atherosclerosis of native arteries of left leg with ulceration of other part of lower left leg: Secondary | ICD-10-CM | POA: Diagnosis not present

## 2022-07-07 DIAGNOSIS — I70249 Atherosclerosis of native arteries of left leg with ulceration of unspecified site: Secondary | ICD-10-CM

## 2022-07-07 DIAGNOSIS — N179 Acute kidney failure, unspecified: Secondary | ICD-10-CM | POA: Diagnosis not present

## 2022-07-07 DIAGNOSIS — E1151 Type 2 diabetes mellitus with diabetic peripheral angiopathy without gangrene: Secondary | ICD-10-CM

## 2022-07-07 DIAGNOSIS — I2510 Atherosclerotic heart disease of native coronary artery without angina pectoris: Secondary | ICD-10-CM

## 2022-07-07 DIAGNOSIS — Z794 Long term (current) use of insulin: Secondary | ICD-10-CM

## 2022-07-07 DIAGNOSIS — I10 Essential (primary) hypertension: Secondary | ICD-10-CM | POA: Diagnosis not present

## 2022-07-07 DIAGNOSIS — E871 Hypo-osmolality and hyponatremia: Secondary | ICD-10-CM | POA: Diagnosis not present

## 2022-07-07 DIAGNOSIS — E860 Dehydration: Secondary | ICD-10-CM | POA: Diagnosis not present

## 2022-07-07 HISTORY — PX: BYPASS GRAFT FEMORAL-PERONEAL: SHX5762

## 2022-07-07 LAB — POCT I-STAT 7, (LYTES, BLD GAS, ICA,H+H)
Acid-base deficit: 1 mmol/L (ref 0.0–2.0)
Acid-base deficit: 3 mmol/L — ABNORMAL HIGH (ref 0.0–2.0)
Acid-base deficit: 4 mmol/L — ABNORMAL HIGH (ref 0.0–2.0)
Bicarbonate: 23.4 mmol/L (ref 20.0–28.0)
Bicarbonate: 22.6 mmol/L (ref 20.0–28.0)
Bicarbonate: 22.9 mmol/L (ref 20.0–28.0)
Calcium, Ion: 1.21 mmol/L (ref 1.15–1.40)
Calcium, Ion: 1.18 mmol/L (ref 1.15–1.40)
Calcium, Ion: 1.19 mmol/L (ref 1.15–1.40)
HCT: 22 % — ABNORMAL LOW (ref 39.0–52.0)
HCT: 22 % — ABNORMAL LOW (ref 39.0–52.0)
HCT: 33 % — ABNORMAL LOW (ref 39.0–52.0)
Hemoglobin: 7.5 g/dL — ABNORMAL LOW (ref 13.0–17.0)
Hemoglobin: 11.2 g/dL — ABNORMAL LOW (ref 13.0–17.0)
Hemoglobin: 7.5 g/dL — ABNORMAL LOW (ref 13.0–17.0)
O2 Saturation: 100 %
O2 Saturation: 96 %
O2 Saturation: 98 %
Patient temperature: 36.6
Patient temperature: 36.4
Patient temperature: 36.4
Potassium: 3.7 mmol/L (ref 3.5–5.1)
Potassium: 4.3 mmol/L (ref 3.5–5.1)
Potassium: 4.4 mmol/L (ref 3.5–5.1)
Sodium: 141 mmol/L (ref 135–145)
Sodium: 141 mmol/L (ref 135–145)
Sodium: 141 mmol/L (ref 135–145)
TCO2: 25 mmol/L (ref 22–32)
TCO2: 24 mmol/L (ref 22–32)
TCO2: 24 mmol/L (ref 22–32)
pCO2 arterial: 37.3 mmHg (ref 32–48)
pCO2 arterial: 45.8 mmHg (ref 32–48)
pCO2 arterial: 45.8 mmHg (ref 32–48)
pH, Arterial: 7.405 (ref 7.35–7.45)
pH, Arterial: 7.298 — ABNORMAL LOW (ref 7.35–7.45)
pH, Arterial: 7.303 — ABNORMAL LOW (ref 7.35–7.45)
pO2, Arterial: 186 mmHg — ABNORMAL HIGH (ref 83–108)
pO2, Arterial: 116 mmHg — ABNORMAL HIGH (ref 83–108)
pO2, Arterial: 87 mmHg (ref 83–108)

## 2022-07-07 LAB — CBC
HCT: 23.7 % — ABNORMAL LOW (ref 39.0–52.0)
HCT: 29.4 % — ABNORMAL LOW (ref 39.0–52.0)
Hemoglobin: 7.9 g/dL — ABNORMAL LOW (ref 13.0–17.0)
Hemoglobin: 9.7 g/dL — ABNORMAL LOW (ref 13.0–17.0)
MCH: 31.2 pg (ref 26.0–34.0)
MCH: 30.5 pg (ref 26.0–34.0)
MCHC: 33.3 g/dL (ref 30.0–36.0)
MCHC: 33 g/dL (ref 30.0–36.0)
MCV: 93.7 fL (ref 80.0–100.0)
MCV: 92.5 fL (ref 80.0–100.0)
Platelets: 101 10*3/uL — ABNORMAL LOW (ref 150–400)
Platelets: 94 10*3/uL — ABNORMAL LOW (ref 150–400)
RBC: 2.53 MIL/uL — ABNORMAL LOW (ref 4.22–5.81)
RBC: 3.18 MIL/uL — ABNORMAL LOW (ref 4.22–5.81)
RDW: 15.8 % — ABNORMAL HIGH (ref 11.5–15.5)
RDW: 15.8 % — ABNORMAL HIGH (ref 11.5–15.5)
WBC: 4.9 10*3/uL (ref 4.0–10.5)
WBC: 8.4 10*3/uL (ref 4.0–10.5)
nRBC: 0 % (ref 0.0–0.2)
nRBC: 0 % (ref 0.0–0.2)

## 2022-07-07 LAB — RENAL FUNCTION PANEL
Albumin: 2.4 g/dL — ABNORMAL LOW (ref 3.5–5.0)
Anion gap: 6 (ref 5–15)
BUN: 9 mg/dL (ref 8–23)
CO2: 21 mmol/L — ABNORMAL LOW (ref 22–32)
Calcium: 8 mg/dL — ABNORMAL LOW (ref 8.9–10.3)
Chloride: 112 mmol/L — ABNORMAL HIGH (ref 98–111)
Creatinine, Ser: 1.35 mg/dL — ABNORMAL HIGH (ref 0.61–1.24)
GFR, Estimated: 52 mL/min — ABNORMAL LOW (ref >60)
Glucose, Bld: 116 mg/dL — ABNORMAL HIGH (ref 70–99)
Phosphorus: 3.8 mg/dL (ref 2.5–4.6)
Potassium: 3.5 mmol/L (ref 3.5–5.1)
Sodium: 139 mmol/L (ref 135–145)

## 2022-07-07 LAB — HEPARIN LEVEL (UNFRACTIONATED): Heparin Unfractionated: 0.37 IU/mL (ref 0.30–0.70)

## 2022-07-07 LAB — GLUCOSE, CAPILLARY
Glucose-Capillary: 131 mg/dL — ABNORMAL HIGH (ref 70–99)
Glucose-Capillary: 123 mg/dL — ABNORMAL HIGH (ref 70–99)
Glucose-Capillary: 165 mg/dL — ABNORMAL HIGH (ref 70–99)
Glucose-Capillary: 189 mg/dL — ABNORMAL HIGH (ref 70–99)
Glucose-Capillary: 231 mg/dL — ABNORMAL HIGH (ref 70–99)

## 2022-07-07 LAB — PREPARE RBC (CROSSMATCH)

## 2022-07-07 LAB — POCT ACTIVATED CLOTTING TIME
Activated Clotting Time: 221 seconds
Activated Clotting Time: 233 seconds

## 2022-07-07 LAB — SURGICAL PCR SCREEN
MRSA, PCR: NEGATIVE
Staphylococcus aureus: NEGATIVE

## 2022-07-07 SURGERY — CREATION, BYPASS, ARTERIAL, FEMORAL TO PERONEAL, USING GRAFT
Anesthesia: General | Site: Leg Upper | Laterality: Left

## 2022-07-07 MED ORDER — PROPOFOL 10 MG/ML IV BOLUS
INTRAVENOUS | Status: AC
  Filled 2022-07-07: qty 20

## 2022-07-07 MED ORDER — PHENYLEPHRINE HCL-NACL 20-0.9 MG/250ML-% IV SOLN
INTRAVENOUS | Status: DC | PRN
  Administered 2022-07-07: 25 ug/min via INTRAVENOUS

## 2022-07-07 MED ORDER — HYDROMORPHONE HCL 1 MG/ML IJ SOLN
INTRAMUSCULAR | Status: DC | PRN
  Administered 2022-07-07: .25 mg via INTRAVENOUS

## 2022-07-07 MED ORDER — GLYCOPYRROLATE PF 0.2 MG/ML IJ SOSY
PREFILLED_SYRINGE | INTRAMUSCULAR | Status: DC | PRN
  Administered 2022-07-07: .2 mg via INTRAVENOUS

## 2022-07-07 MED ORDER — HEPARIN 6000 UNIT IRRIGATION SOLUTION
Status: DC | PRN
  Administered 2022-07-07: 1

## 2022-07-07 MED ORDER — FENTANYL CITRATE (PF) 100 MCG/2ML IJ SOLN: 25.0000 ug | INTRAMUSCULAR | Status: DC | PRN

## 2022-07-07 MED ORDER — GLYCOPYRROLATE PF 0.2 MG/ML IJ SOSY
PREFILLED_SYRINGE | INTRAMUSCULAR | Status: AC
  Filled 2022-07-07: qty 1

## 2022-07-07 MED ORDER — LIDOCAINE 2% (20 MG/ML) 5 ML SYRINGE
INTRAMUSCULAR | Status: AC
  Filled 2022-07-07: qty 5

## 2022-07-07 MED ORDER — MORPHINE SULFATE (PF) 2 MG/ML IV SOLN
4.0000 mg | INTRAVENOUS | Status: DC | PRN
  Administered 2022-07-09: 4 mg via INTRAVENOUS
  Filled 2022-07-07: qty 2

## 2022-07-07 MED ORDER — ROCURONIUM BROMIDE 10 MG/ML (PF) SYRINGE
PREFILLED_SYRINGE | INTRAVENOUS | Status: AC
  Filled 2022-07-07: qty 10

## 2022-07-07 MED ORDER — DEXAMETHASONE SODIUM PHOSPHATE 10 MG/ML IJ SOLN
INTRAMUSCULAR | Status: AC
  Filled 2022-07-07: qty 1

## 2022-07-07 MED ORDER — OXYCODONE HCL 5 MG/5ML PO SOLN: 5.0000 mg | Freq: Once | ORAL | Status: DC | PRN

## 2022-07-07 MED ORDER — SODIUM CHLORIDE 0.9 % IV SOLN: INTRAVENOUS | Status: DC | PRN

## 2022-07-07 MED ORDER — CHLORHEXIDINE GLUCONATE 0.12 % MT SOLN
OROMUCOSAL | Status: AC
  Administered 2022-07-07: 15 mL
  Filled 2022-07-07: qty 15

## 2022-07-07 MED ORDER — SUGAMMADEX SODIUM 200 MG/2ML IV SOLN
INTRAVENOUS | Status: DC | PRN
  Administered 2022-07-07: 200 mg via INTRAVENOUS

## 2022-07-07 MED ORDER — 0.9 % SODIUM CHLORIDE (POUR BTL) OPTIME
TOPICAL | Status: DC | PRN
  Administered 2022-07-07: 2000 mL

## 2022-07-07 MED ORDER — FENTANYL CITRATE (PF) 250 MCG/5ML IJ SOLN
INTRAMUSCULAR | Status: DC | PRN
  Administered 2022-07-07: 50 ug via INTRAVENOUS
  Administered 2022-07-07: 100 ug via INTRAVENOUS
  Administered 2022-07-07 (×2): 50 ug via INTRAVENOUS

## 2022-07-07 MED ORDER — HEPARIN 6000 UNIT IRRIGATION SOLUTION
Status: AC
  Filled 2022-07-07: qty 500

## 2022-07-07 MED ORDER — OXYCODONE HCL 5 MG PO TABS: 5.0000 mg | ORAL_TABLET | Freq: Once | ORAL | Status: DC | PRN

## 2022-07-07 MED ORDER — HEPARIN SODIUM (PORCINE) 1000 UNIT/ML IJ SOLN
INTRAMUSCULAR | Status: AC
  Filled 2022-07-07: qty 10

## 2022-07-07 MED ORDER — HEPARIN SODIUM (PORCINE) 1000 UNIT/ML IJ SOLN
INTRAMUSCULAR | Status: DC | PRN
  Administered 2022-07-07: 2000 [IU] via INTRAVENOUS
  Administered 2022-07-07: 9000 [IU] via INTRAVENOUS

## 2022-07-07 MED ORDER — FENTANYL CITRATE (PF) 250 MCG/5ML IJ SOLN
INTRAMUSCULAR | Status: AC
  Filled 2022-07-07: qty 5

## 2022-07-07 MED ORDER — ACETAMINOPHEN 500 MG PO TABS
1000.0000 mg | ORAL_TABLET | Freq: Once | ORAL | Status: AC
  Administered 2022-07-07: 1000 mg via ORAL
  Filled 2022-07-07: qty 2

## 2022-07-07 MED ORDER — OXYCODONE HCL 5 MG PO TABS: 5.0000 mg | ORAL_TABLET | ORAL | Status: DC | PRN

## 2022-07-07 MED ORDER — CEFAZOLIN SODIUM 1 G IJ SOLR
INTRAMUSCULAR | Status: AC
  Filled 2022-07-07: qty 20

## 2022-07-07 MED ORDER — ACETAMINOPHEN 325 MG PO TABS
650.0000 mg | ORAL_TABLET | Freq: Four times a day (QID) | ORAL | Status: DC
  Administered 2022-07-07 – 2022-07-13 (×13): 650 mg via ORAL
  Filled 2022-07-07 (×13): qty 2

## 2022-07-07 MED ORDER — ROCURONIUM BROMIDE 10 MG/ML (PF) SYRINGE
PREFILLED_SYRINGE | INTRAVENOUS | Status: DC | PRN
  Administered 2022-07-07: 60 mg via INTRAVENOUS
  Administered 2022-07-07: 40 mg via INTRAVENOUS

## 2022-07-07 MED ORDER — PROTAMINE SULFATE 10 MG/ML IV SOLN
INTRAVENOUS | Status: DC | PRN
  Administered 2022-07-07: 20 mg via INTRAVENOUS
  Administered 2022-07-07: 30 mg via INTRAVENOUS

## 2022-07-07 MED ORDER — HYDROMORPHONE HCL 1 MG/ML IJ SOLN
INTRAMUSCULAR | Status: AC
  Filled 2022-07-07: qty 0.5

## 2022-07-07 MED ORDER — HEMOSTATIC AGENTS (NO CHARGE) OPTIME
TOPICAL | Status: DC | PRN
  Administered 2022-07-07 (×2): 1 via TOPICAL

## 2022-07-07 MED ORDER — LACTATED RINGERS IV SOLN: INTRAVENOUS | Status: DC

## 2022-07-07 MED ORDER — ONDANSETRON HCL 4 MG/2ML IJ SOLN
INTRAMUSCULAR | Status: DC | PRN
  Administered 2022-07-07: 4 mg via INTRAVENOUS

## 2022-07-07 MED ORDER — ACETAMINOPHEN 500 MG PO TABS
1000.0000 mg | ORAL_TABLET | Freq: Once | ORAL | Status: DC
  Filled 2022-07-07: qty 2

## 2022-07-07 MED ORDER — CEFAZOLIN SODIUM-DEXTROSE 2-3 GM-%(50ML) IV SOLR
INTRAVENOUS | Status: DC | PRN
  Administered 2022-07-07: 2 g via INTRAVENOUS

## 2022-07-07 MED ORDER — DEXAMETHASONE SODIUM PHOSPHATE 10 MG/ML IJ SOLN
INTRAMUSCULAR | Status: DC | PRN
  Administered 2022-07-07: 5 mg via INTRAVENOUS

## 2022-07-07 MED ORDER — LIDOCAINE 2% (20 MG/ML) 5 ML SYRINGE
INTRAMUSCULAR | Status: DC | PRN
  Administered 2022-07-07: 100 mg via INTRAVENOUS

## 2022-07-07 MED ORDER — ONDANSETRON HCL 4 MG/2ML IJ SOLN
INTRAMUSCULAR | Status: AC
  Filled 2022-07-07: qty 2

## 2022-07-07 MED ORDER — PROPOFOL 10 MG/ML IV BOLUS
INTRAVENOUS | Status: DC | PRN
  Administered 2022-07-07: 160 mg via INTRAVENOUS

## 2022-07-07 MED ORDER — MORPHINE SULFATE (PF) 4 MG/ML IV SOLN: 4.0000 mg | INTRAVENOUS | Status: DC | PRN

## 2022-07-07 SURGICAL SUPPLY — 68 items
AGENT HMST KT MTR STRL THRMB (HEMOSTASIS) ×1
APL PRP STRL LF DISP 70% ISPRP (MISCELLANEOUS) ×2
BAG COUNTER SPONGE SURGICOUNT (BAG) ×1 IMPLANT
BAG ISL DRAPE 18X18 STRL (DRAPES) ×1
BAG ISOLATION DRAPE 18X18 (DRAPES) IMPLANT
BAG SPNG CNTER NS LX DISP (BAG) ×1
BANDAGE ESMARK 6X9 LF (GAUZE/BANDAGES/DRESSINGS) IMPLANT
BNDG CMPR 9X6 STRL LF SNTH (GAUZE/BANDAGES/DRESSINGS) ×1
BNDG ESMARK 6X9 LF (GAUZE/BANDAGES/DRESSINGS) ×1
CANISTER SUCT 3000ML PPV (MISCELLANEOUS) ×1 IMPLANT
CANNULA VESSEL 3MM 2 BLNT TIP (CANNULA) IMPLANT
CHLORAPREP W/TINT 26 (MISCELLANEOUS) ×2 IMPLANT
CLIP LIGATING EXTRA MED SLVR (CLIP) IMPLANT
CLIP LIGATING EXTRA SM BLUE (MISCELLANEOUS) IMPLANT
COVER PROBE W GEL 5X96 (DRAPES) IMPLANT
CUFF TOURN SGL QUICK 24 (TOURNIQUET CUFF)
CUFF TOURN SGL QUICK 34 (TOURNIQUET CUFF) ×1
CUFF TOURN SGL QUICK 42 (TOURNIQUET CUFF) IMPLANT
CUFF TRNQT CYL 24X4X16.5-23 (TOURNIQUET CUFF) IMPLANT
CUFF TRNQT CYL 34X4.125X (TOURNIQUET CUFF) IMPLANT
DRAIN CHANNEL 15F RND FF W/TCR (WOUND CARE) IMPLANT
DRAPE C-ARM 42X72 X-RAY (DRAPES) IMPLANT
DRAPE HALF SHEET 40X57 (DRAPES) IMPLANT
DRAPE ISOLATION BAG 18X18 (DRAPES) ×1
DRAPE X-RAY CASS 24X20 (DRAPES) IMPLANT
DRSG COVADERM 4X6 (GAUZE/BANDAGES/DRESSINGS) IMPLANT
DRSG COVADERM 4X8 (GAUZE/BANDAGES/DRESSINGS) IMPLANT
ELECT REM PT RETURN 9FT ADLT (ELECTROSURGICAL) ×1
ELECTRODE REM PT RTRN 9FT ADLT (ELECTROSURGICAL) ×1 IMPLANT
EVACUATOR SILICONE 100CC (DRAIN) IMPLANT
GAUZE SPONGE 4X4 12PLY STRL (GAUZE/BANDAGES/DRESSINGS) ×1 IMPLANT
GLOVE BIO SURGEON STRL SZ 6.5 (GLOVE) IMPLANT
GLOVE BIO SURGEON STRL SZ8 (GLOVE) ×3 IMPLANT
GLOVE BIOGEL PI IND STRL 7.0 (GLOVE) IMPLANT
GOWN STRL REUS W/ TWL LRG LVL3 (GOWN DISPOSABLE) ×2 IMPLANT
GOWN STRL REUS W/ TWL XL LVL3 (GOWN DISPOSABLE) ×1 IMPLANT
GOWN STRL REUS W/TWL LRG LVL3 (GOWN DISPOSABLE) ×2
GOWN STRL REUS W/TWL XL LVL3 (GOWN DISPOSABLE) ×1
GRAFT PROPATEN W/RING 6X80X60 (Vascular Products) IMPLANT
HEMOSTAT SNOW SURGICEL 2X4 (HEMOSTASIS) IMPLANT
INSERT FOGARTY SM (MISCELLANEOUS) IMPLANT
KIT BASIN OR (CUSTOM PROCEDURE TRAY) ×1 IMPLANT
KIT TURNOVER KIT B (KITS) ×1 IMPLANT
MARKER GRAFT CORONARY BYPASS (MISCELLANEOUS) IMPLANT
NS IRRIG 1000ML POUR BTL (IV SOLUTION) ×2 IMPLANT
PACK PERIPHERAL VASCULAR (CUSTOM PROCEDURE TRAY) ×1 IMPLANT
PAD ARMBOARD 7.5X6 YLW CONV (MISCELLANEOUS) ×2 IMPLANT
SET COLLECT BLD 21X3/4 12 (NEEDLE) IMPLANT
SET WALTER ACTIVATION W/DRAPE (SET/KITS/TRAYS/PACK) IMPLANT
SHEATH PROBE COVER 6X72 (BAG) IMPLANT
STAPLER VISISTAT 35W (STAPLE) IMPLANT
STOPCOCK 4 WAY LG BORE MALE ST (IV SETS) IMPLANT
SURGIFLO W/THROMBIN 8M KIT (HEMOSTASIS) IMPLANT
SUT ETHILON 3 0 PS 1 (SUTURE) IMPLANT
SUT MNCRL AB 4-0 PS2 18 (SUTURE) ×2 IMPLANT
SUT PROLENE 5 0 C 1 24 (SUTURE) ×1 IMPLANT
SUT PROLENE 6 0 BV (SUTURE) ×1 IMPLANT
SUT SILK 2 0 SH (SUTURE) ×1 IMPLANT
SUT SILK 3 0 (SUTURE)
SUT SILK 3-0 18XBRD TIE 12 (SUTURE) IMPLANT
SUT VIC AB 2-0 CT1 27 (SUTURE) ×2
SUT VIC AB 2-0 CT1 TAPERPNT 27 (SUTURE) ×2 IMPLANT
SUT VIC AB 3-0 SH 27 (SUTURE) ×2
SUT VIC AB 3-0 SH 27X BRD (SUTURE) ×2 IMPLANT
TOWEL GREEN STERILE (TOWEL DISPOSABLE) ×1 IMPLANT
TRAY FOLEY MTR SLVR 16FR STAT (SET/KITS/TRAYS/PACK) ×1 IMPLANT
UNDERPAD 30X36 HEAVY ABSORB (UNDERPADS AND DIAPERS) ×1 IMPLANT
WATER STERILE IRR 1000ML POUR (IV SOLUTION) ×1 IMPLANT

## 2022-07-07 NOTE — Op Note (Signed)
DATE OF SERVICE: 07/07/2022  PATIENT:  Steve [name].  81 y.o. male  PRE-OPERATIVE DIAGNOSIS:  atherosclerosis of native arteries of left lower extremity causing ulceration  POST-OPERATIVE DIAGNOSIS:  Same  PROCEDURE:   Left common femoral - peroneal artery bypass with 78mm PTFE in subcutaneous tunnel  SURGEON:  Surgeon(s) and Role:    * Cherre Robins, MD - Primary  ASSISTANT: Paulo Fruit, PA-C  An experienced assistant was required given the complexity of this procedure and the standard of surgical care. My assistant helped with exposure through counter tension, suctioning, ligation and retraction to better visualize the surgical field.  My assistant expedited sewing during the case by following my sutures. Wherever I use the term "we" in the report, my assistant actively helped me with that portion of the procedure.  ANESTHESIA:   general  EBL: 241mL  BLOOD ADMINISTERED:none  DRAINS: none   LOCAL MEDICATIONS USED:  NONE  SPECIMEN:  none  COUNTS: confirmed correct.  TOURNIQUET:    Total Tourniquet Time Documented: Thigh (Left) - 17 minutes Total: Thigh (Left) - 17 minutes  PATIENT DISPOSITION:  PACU - hemodynamically stable.   Delay start of Pharmacological VTE agent (>24hrs) due to surgical blood loss or risk of bleeding: no  INDICATION FOR PROCEDURE: Steve [name]. is a 81 y.o. male with atherosclerosis of native arteries of left lower extremity causing ulceration.  Preoperative angiogram shows popliteal artery occlusion with reconstitution of the peroneal artery in the mid calf.  After careful discussion of risks, benefits, and alternatives the patient was offered left femoral to popliteal artery bypass. We specifically discussed high risk features of his physiology and this bypass. The patient understood and wished to proceed.  OPERATIVE FINDINGS: Heavily calcified common femoral artery.  Proximal anastomosis performed at bifurcation of common femoral artery  onto superficial femoral artery.  This was the only soft location suitable for anastomosis without endarterectomy.  Peroneal artery heavily diseased until the distal third of the calf.  Distal anastomosis performed under tourniquet control.  Good technical result achieved strong Doppler signal in the distal peroneal artery which obliterated with occlusion of the bypass.  DESCRIPTION OF PROCEDURE: After identification of the patient in the pre-operative holding area, the patient was transferred to the operating room. The patient was positioned supine on the operating room table. Anesthesia was induced. The left leg was prepped and draped in standard fashion. A surgical pause was performed confirming correct patient, procedure, and operative location.  Longitudinal incision was made in the medial left calf.  The incision was carried down through the subcutaneous tissue until the superficial posterior compartment was encountered.  This was divided with Bovie electrocautery.  The soleal tibial attachment was taken down.  The deep posterior compartment was entered.  The peroneal vascular bundle was identified and skeletonized.  The peroneal artery was severely diseased and inflamed.  I was able to identify a reasonably healthy segment in the distal third of the calf.  An oblique incision was made in the groin over the common femoral artery bifurcation.  The incision was carried down through subcutaneous tissue.  The femoral sheath was divided.  The femoral arteries were skeletonized.  Unfortunately the only soft area was over the bifurcation of the common femoral artery onto the superficial femoral artery.  All arteries were encircled with Silastic Vesseloops.  A subcutaneous tunnel was created between the 2 exposures using a Kelly-Wick tunneler.  Patient was systemically heparinized.  Activated clotting time measurements were used  throughout the case to confirm adequate anticoagulation.  A 6 mm externally  supported PTFE vascular graft was delivered through the Kelly-Wick tunneler.  Clamps were applied to the common femoral artery, profunda femoris artery, and superficial femoral artery.  An anterior arteriotomy was created with an 11 blade, extended with Potts scissors, and partially debrided to allow widely patent proximal anastomosis.  The proximal anastomosis was performed using continuous running suture of 5-0 Prolene.  Prior to completion the anastomosis was flushed on the open end of the vascular graft.  Hemostasis was achieved the anastomosis.  Some needle hole bleeding was noted.  This was packed away.  Attention was turned to the perineal anastomosis  The distal anastomosis was beveled to allow end to side anastomosis to the peroneal artery.  The bypass was measured to avoid undue redundancy or tension.  A pneumatic tourniquet was applied to the thigh.  The leg was exsanguinated with an Esmarch tourniquet.  The pneumatic tourniquet was inflated.  An anterior arteriotomy was made on the peroneal artery and extended with Potts scissors.  The distal anastomosis was performed using continuous running suture of 6-0 Prolene.  Immediately prior to completion, the tourniquet was released.  The anastomosis was flushed.  Clamp was released on the vascular graft.  Pulsatile flow was noted into the peroneal artery.  Hemostasis was achieved.  The distal peroneal artery was evaluated with Doppler machine.  Excellent Doppler flow was heard.  This Doppler flow obliterated with occlusion of the bypass.  Satisfied we ended the case.  Heparin was reversed with protamine.  Hemostasis was achieved in the surgical beds and the anastomoses with hemostatic agents.  The calf incision was closed using 2-0 Vicryl and skin stapler.  The groin incision was closed using 2-0 Vicryl, 3-0 Vicryl, 4-0 Monocryl.  Steri-Strips were applied to the groin.  Clean bandages were applied to both incisions.  Upon completion of the case  instrument and sharps counts were confirmed correct. The patient was transferred to the PACU in good condition. I was present for all portions of the procedure.  Steve Aline. Stanford Breed, MD [facility] of [facility] Phone Number: [phone] 07/07/2022 3:33 PM

## 2022-07-07 NOTE — Anesthesia Procedure Notes (Signed)
Arterial Line Insertion Start/End9/13/2023 11:45 AM, 07/07/2022 11:55 AM Performed by: Rande Brunt, CRNA, CRNA  Patient location: Pre-op. Preanesthetic checklist: patient identified, IV checked, site marked, risks and benefits discussed, surgical consent, monitors and equipment checked, pre-op evaluation, timeout performed and anesthesia consent Lidocaine 1% used for infiltration Right, radial was placed Catheter size: 20 G Hand hygiene performed  and maximum sterile barriers used   Attempts: 2 (Attempted placement on L x 1, good blood flow but catheter would not thread, pressure dressing applied) Procedure performed without using ultrasound guided technique. Following insertion, dressing applied and Biopatch. Post procedure assessment: normal and unchanged  Patient tolerated the procedure well with no immediate complications.

## 2022-07-07 NOTE — Progress Notes (Signed)
ANTICOAGULATION CONSULT NOTE  Pharmacy Consult for Heparin Indication: atrial fibrillation  Allergies  Allergen Reactions   Januvia [Sitagliptin] Other (See Comments)    Soreness to stomach area. Intolerance    Statins Other (See Comments)    Myalgias, muscle weakness, swelling, pain    Patient Measurements: Height: 5\' 9"  (175.3 cm) Weight: 85.2 kg (187 lb 13.3 oz) IBW/kg (Calculated) : 70.7  Heparin Dosing Weight: 87 kg  Vital Signs: Temp: 98.2 F (36.8 C) (09/13 0810) Temp Source: Oral (09/13 0810) BP: 147/60 (09/13 0415) Pulse Rate: 62 (09/13 0810)  Labs: Recent Labs    07/05/22 0250 07/06/22 0354 07/07/22 0413  HGB 8.0* 7.8* 7.9*  HCT 24.2* 23.6* 23.7*  PLT 113* 108* 101*  HEPARINUNFRC 0.46 0.36 0.37  CREATININE 1.49* 1.45* 1.35*     Estimated Creatinine Clearance: 44.9 mL/min (A) (by C-G formula based on SCr of 1.35 mg/dL (H)).   Assessment: 81 yo male admitted for left foot wound infection on apixaban PTA for afib. Apixaban now held for pending arteriography +/- revascularization on Friday. Last dose apixaban 9/5. Pharmacy consulted to manage IV heparin while apixaban on hold.   Heparin level 0.37 therapeutic on 1750 units/hr.  Hgb low-stable at 7.9. Plt 101, stable.   No issues with infusion or bleeding noted.   Goal of Therapy:  Heparin level 0.3-0.7 units/ml aPTT 66-102 seconds Monitor platelets by anticoagulation protocol: Yes   Plan:  Continue heparin infusion at 1750 units/hr  Continue daily anti-Xa level, CBC, and monitor for any signs or symptoms of bleeding F/u plans for fem peroneal artery bypass (planned for today, 9/13 @1145 ) and transition back to Eliquis    Thank you for allowing [place] to participate in this patients care.  Luisa Hart, PharmD, BCPS Clinical Pharmacist 07/07/2022 8:21 AM   Please refer to AMION for pharmacy phone number

## 2022-07-07 NOTE — Progress Notes (Signed)
Mobility Specialist Progress Note:   07/07/22 1122  Mobility  Activity Off unit   Pt off unit. Will follow-up as time allows.   [facility] Surveyor, mining Chat only

## 2022-07-07 NOTE — Op Note (Deleted)
[facility] OF  PROGRESS NOTE  ASSESSMENT / PLAN: Steve [name]. is a 81 y.o. male with atherosclerosis of native arteries of left lower extremity causing ulceration.  Patient counseled patients with chronic limb threatening ischemia have an annual risk of cardiovascular mortality of 25% and a high risk of amputation.   WIfI score calculated based on clinical exam and non-invasive measurements. 2 / 3 / 0. Clinical stage IV  Recommend the following which can slow the progression of atherosclerosis and reduce the risk of major adverse cardiac / limb events:  Complete cessation from all tobacco products. Blood glucose control with goal A1c < 7%. Blood pressure control with goal blood pressure < 140/90 mmHg. Lipid reduction therapy with goal LDL-C <100 mg/dL (<70 if symptomatic from PAD).  Aspirin 81mg  PO QD.  Atorvastatin 40-80mg  PO QD (or other "high intensity" statin therapy).  Plan left femoral-peroneal bypass with PTFE. Reviewed high risk features with patient. He wants to try everything possible for limb salvage.   SUBJECTIVE: No complaints. Reviewed OR plan.  OBJECTIVE: BP (!) 165/56   Pulse 61   Temp 98 F (36.7 C) (Oral)   Resp 20   Ht 5\' 9"  (1.753 m)   Wt 85.2 kg   SpO2 96%   BMI 27.74 kg/m   Intake/Output Summary (Last 24 hours) at 07/07/2022 1138 Last data filed at 07/07/2022 1051 Gross per 24 hour  Intake 2268.12 ml  Output 5200 ml  Net -2931.88 ml    No distress Unchanged L foot No pedal pulses     Latest Ref Rng & Units 07/07/2022    4:13 AM 07/06/2022    3:54 AM 07/05/2022    2:50 AM  CBC  WBC 4.0 - 10.5 K/uL 4.9  4.7  5.1   Hemoglobin 13.0 - 17.0 g/dL 7.9  7.8  8.0   Hematocrit 39.0 - 52.0 % 23.7  23.6  24.2   Platelets 150 - 400 K/uL 101  108  113         Latest Ref Rng & Units 07/07/2022    4:13 AM 07/06/2022    3:54 AM 07/05/2022    2:50 AM  CMP  Glucose 70 - 99 mg/dL 116  121  118   BUN 8 - 23 mg/dL 9  10  12     Creatinine 0.61 - 1.24 mg/dL 1.35  1.45  1.49   Sodium 135 - 145 mmol/L 139  139  138   Potassium 3.5 - 5.1 mmol/L 3.5  3.7  3.0   Chloride 98 - 111 mmol/L 112  113  111   CO2 22 - 32 mmol/L 21  21  19    Calcium 8.9 - 10.3 mg/dL 8.0  8.1  7.9     Estimated Creatinine Clearance: 44.9 mL/min (A) (by C-G formula based on SCr of 1.35 mg/dL (H)).   [name], MD [facility] of [facility] Phone Number: [phone] 07/07/2022 11:38 AM

## 2022-07-07 NOTE — Progress Notes (Signed)
Progress Note Patient: Steve [name]. WHQ:[id number] DOB: 1941-09-18 DOA: 06/25/2022  DOS: the patient was seen and examined on 07/07/2022  Brief hospital course: Steve [name]. is a 81 y.o. male with a history of CAD s/p CABG, hypertension, hyperlipidemia, diabetes mellitus type 2, PAD, atrial fibrillation. Patient presented secondary to tremors and was found to have fevers with concern for possible infection. SIRS criteria met without source identified. Patient started empirically on Vancomycin, Cefepime and Flagyl. Blood and urine cultures obtained. MRI significant for osteomyelitis. Podiatry and vascular surgery consulted. S/P angiogram on 9/8.  Recommendation is for femoropopliteal bypass currently scheduled on 9/13.  Will remain on IV heparin until then.  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  Associated with cellulitis. Osteomyelitis of the fifth metatarsal head/neck in addition to early osteomyelitis of the fifth digit proximal phalanx on MRI from 9/3. Blood and urine cultures are no growth.  Empiric Vancomycin, Cefepime and Flagyl initiated on admission and patient has been deescalated to Cefepime.   As of 9/6 now on cefazolin. -Continue cefazolin  -We will discuss with ID after vascular and podiatry procedure with regards to type and duration of the antibiotic treatment Patient does not want to lose his foot.  AKI on CKD stage IIIa Patient's baseline creatinine appears to be around 1.2. Creatinine of 2.47 on admission. Treated with IV fluids. Mild worsening after angiography.  Currently creatinine down to 1.3 Monitor.   Hyponatremia Sodium of 126 on admission. Likely related to dehydration.  Resolved    Acute urinary retention Unsure of etiology.  Patient appears to be asymptomatic.  Urinalysis is not suggestive of infection. Patient with continued urinary  retention -Foley catheter inserted on 9/3.  Attempt voiding trial on 9/14 after bypass surgery. -Outpatient urology follow-up   Constipation Resolved.  Continue current bowel regimen.   Diabetes mellitus type 2 uncontrolled with hyperglycemia with nonhealing wounds. Patient is on Januvia, Levemir and metformin as an outpatient. Most recent hemoglobin A1C is 7.9%. -Continue Levemir and SSI Blood sugar stable   CAD Hyperlipidemia PAD No chest pain. Stable. Continue Zetia, on Repatha outpatient. ABI significantly abnormal.  Angiography shows evidence of significant stenotic disease. S/p left common femoral-peroneal artery bypass   Primary hypertension -Continue Imdur and Toprol XL   Paroxysmal atrial fibrillation -Patient was on Eliquis and Toprol XL Currently on IV heparin.   Thrombocytopenia No current bleeding. stable.   Acute encephalopathy. Mostly delirium Etiology not clear  most likely combination of prolonged hospitalization and medication side effect.  Unable to differentiate further whether metabolic or toxic. Now resolved. Monitor.   Prolonged QTc. QTc is 501. Monitor for now.  Avoid QT prolonging medications.  Right ankle pain. Patient reports that his right ankle is also hurting significantly for last few days. X-ray ankle negative for any acute abnormality.  Anemia -likely has some component of acute blood loss, may also have an element of chronic disease -transfused 2 units prbc 9/13 during surgery -continue to follow serial cbc   Subjective: seen in his room post procedure. Describes some burning pain in left foot  Physical Exam: Vitals:   07/07/22 1540 07/07/22 1545 07/07/22 1600 07/07/22 1615  BP: (!) 148/56 (!) 148/61 (!) 152/55 (!) 159/58  Pulse: 63 62 (!) 55 (!) 56  Resp: _0 Temp: (!) 97 F (36.1 C)   (!) 97 F (36.1 C)  TempSrc:  SpO2: (!) 88% 91% 96% 97%  Weight:      Height:       General: sitting in up bed, no  distress, skin appears pale Cardiovascular: S1 and S2 Present, aortic systolic Murmur, Respiratory: good respiratory effort, Bilateral Air entry present and CTA, no Crackles, no wheezes Abdomen: Bowel Sound present, Non tender Extremities: no Pedal edema Neurology: alert and oriented to Self, Place and time.   Data Reviewed: I have Reviewed nursing notes, Vitals, and Lab results since pt's last encounter. Pertinent lab results BMP I have ordered test including CBC and BMP    Family Communication: discussed with son at the bedside  Disposition: Status is: Inpatient Remains inpatient appropriate because: Scheduled for procedure on Wednesday.  Monitor postop course.  Author: Kathie Dike, MD 07/07/2022 5:15 PM  Please look on [email or web address] to find out who is on call.

## 2022-07-07 NOTE — Anesthesia Procedure Notes (Signed)
Procedure Name: Intubation Date/Time: 07/07/2022 12:29 PM  Performed by: Rande Brunt, CRNAPre-anesthesia Checklist: Patient identified, Emergency Drugs available, Suction available and Patient being monitored Patient Re-evaluated:Patient Re-evaluated prior to induction Oxygen Delivery Method: Circle System Utilized Preoxygenation: Pre-oxygenation with 100% oxygen Induction Type: IV induction Ventilation: Mask ventilation without difficulty Laryngoscope Size: Miller and 2 Grade View: Grade I Tube type: Oral Tube size: 7.5 mm Number of attempts: 1 Airway Equipment and Method: Stylet and Oral airway Placement Confirmation: ETT inserted through vocal cords under direct vision, positive ETCO2 and breath sounds checked- equal and bilateral Secured at: 22 cm Tube secured with: Tape Dental Injury: Teeth and Oropharynx as per pre-operative assessment

## 2022-07-07 NOTE — Progress Notes (Signed)
[facility]ISTS OF [place] PROGRESS NOTE  ASSESSMENT / PLAN: Steve [name]. is a 81 y.o. male with atherosclerosis of native arteries of left lower extremity causing ulceration.  Patient counseled patients with chronic limb threatening ischemia have an annual risk of cardiovascular mortality of 25% and a high risk of amputation.   WIfI score calculated based on clinical exam and non-invasive measurements. 2 / 3 / 0. Clinical stage IV  Recommend the following which can slow the progression of atherosclerosis and reduce the risk of major adverse cardiac / limb events:  Complete cessation from all tobacco products. Blood glucose control with goal A1c < 7%. Blood pressure control with goal blood pressure < 140/90 mmHg. Lipid reduction therapy with goal LDL-C <100 mg/dL (<70 if symptomatic from PAD).  Aspirin 81mg  PO QD.  Atorvastatin 40-80mg  PO QD (or other "high intensity" statin therapy).  Plan left femoral-peroneal bypass with PTFE. Reviewed high risk features with patient. He wants to try everything possible for limb salvage.   SUBJECTIVE: No complaints. Reviewed OR plan.  OBJECTIVE: BP (!) 165/56   Pulse 61   Temp 98 F (36.7 C) (Oral)   Resp 20   Ht 5\' 9"  (1.753 m)   Wt 85.2 kg   SpO2 96%   BMI 27.74 kg/m   Intake/Output Summary (Last 24 hours) at 07/07/2022 1533 Last data filed at 07/07/2022 1531 Gross per 24 hour  Intake 4848.12 ml  Output 5050 ml  Net -201.88 ml     No distress Unchanged L foot No pedal pulses     Latest Ref Rng & Units 07/07/2022    4:13 AM 07/06/2022    3:54 AM 07/05/2022    2:50 AM  CBC  WBC 4.0 - 10.5 K/uL 4.9  4.7  5.1   Hemoglobin 13.0 - 17.0 g/dL 7.9  7.8  8.0   Hematocrit 39.0 - 52.0 % 23.7  23.6  24.2   Platelets 150 - 400 K/uL 101  108  113         Latest Ref Rng & Units 07/07/2022    4:13 AM 07/06/2022    3:54 AM 07/05/2022    2:50 AM  CMP  Glucose 70 - 99 mg/dL 116  121  118   BUN 8 - 23 mg/dL 9  10  12     Creatinine 0.61 - 1.24 mg/dL 1.35  1.45  1.49   Sodium 135 - 145 mmol/L 139  139  138   Potassium 3.5 - 5.1 mmol/L 3.5  3.7  3.0   Chloride 98 - 111 mmol/L 112  113  111   CO2 22 - 32 mmol/L 21  21  19    Calcium 8.9 - 10.3 mg/dL 8.0  8.1  7.9     Estimated Creatinine Clearance: 44.9 mL/min (A) (by C-G formula based on SCr of 1.35 mg/dL (H)).   Steve Aline. Stanford Breed, MD [facility] of [facility] Phone Number: [phone] 07/07/2022 3:33 PM

## 2022-07-07 NOTE — Transfer of Care (Signed)
Immediate Anesthesia Transfer of Care Note  Patient: Camauri Craton.  Procedure(s) Performed: LEFT FEMORAL-PERONEAL ARTERY BYPASS (Left: Leg Upper)  Patient Location: PACU  Anesthesia Type:General  Level of Consciousness: awake, alert , oriented and patient cooperative  Airway & Oxygen Therapy: Patient Spontanous Breathing and Patient connected to nasal cannula oxygen  Post-op Assessment: Report given to RN, Post -op Vital signs reviewed and stable and Patient moving all extremities X 4  Post vital signs: Reviewed and stable  Last Vitals:  Vitals Value Taken Time  BP 148/61 07/07/22 1545  Temp    Pulse 57 07/07/22 1548  Resp 17 07/07/22 1548  SpO2 94 % 07/07/22 1548  Vitals shown include unvalidated device data.  Last Pain:  Vitals:   07/07/22 1125  TempSrc:   PainSc: 0-No pain      Patients Stated Pain Goal: 0 (80/03/49 1791)  Complications: No notable events documented.

## 2022-07-07 NOTE — Anesthesia Postprocedure Evaluation (Signed)
Anesthesia Post Note  Patient: Steve [name].  Procedure(s) Performed: LEFT FEMORAL-PERONEAL ARTERY BYPASS (Left: Leg Upper)     Patient location during evaluation: PACU Anesthesia Type: General Level of consciousness: awake and alert Pain management: pain level controlled Vital Signs Assessment: post-procedure vital signs reviewed and stable Respiratory status: spontaneous breathing, nonlabored ventilation and respiratory function stable Cardiovascular status: blood pressure returned to baseline Postop Assessment: no apparent nausea or vomiting Anesthetic complications: no   No notable events documented.  Last Vitals:  Vitals:   07/07/22 1600 07/07/22 1615  BP: (!) 152/55 (!) 159/58  Pulse: (!) 55 (!) 56  Resp: 16 14  Temp:    SpO2: 96% 97%    Last Pain:  Vitals:   07/07/22 1600  TempSrc:   PainSc: 0-No pain                 Marthenia Rolling

## 2022-07-08 ENCOUNTER — Encounter (HOSPITAL_COMMUNITY): Payer: Self-pay | Admitting: Vascular Surgery

## 2022-07-08 DIAGNOSIS — I2510 Atherosclerotic heart disease of native coronary artery without angina pectoris: Secondary | ICD-10-CM | POA: Diagnosis not present

## 2022-07-08 DIAGNOSIS — A419 Sepsis, unspecified organism: Secondary | ICD-10-CM | POA: Diagnosis not present

## 2022-07-08 DIAGNOSIS — I5020 Unspecified systolic (congestive) heart failure: Secondary | ICD-10-CM | POA: Diagnosis not present

## 2022-07-08 DIAGNOSIS — I5042 Chronic combined systolic (congestive) and diastolic (congestive) heart failure: Secondary | ICD-10-CM

## 2022-07-08 DIAGNOSIS — E871 Hypo-osmolality and hyponatremia: Secondary | ICD-10-CM | POA: Diagnosis not present

## 2022-07-08 DIAGNOSIS — E860 Dehydration: Secondary | ICD-10-CM | POA: Diagnosis not present

## 2022-07-08 DIAGNOSIS — N179 Acute kidney failure, unspecified: Secondary | ICD-10-CM | POA: Diagnosis not present

## 2022-07-08 DIAGNOSIS — I10 Essential (primary) hypertension: Secondary | ICD-10-CM | POA: Diagnosis not present

## 2022-07-08 LAB — TYPE AND SCREEN
ABO/RH(D): O POS
Antibody Screen: NEGATIVE
Unit division: 0
Unit division: 0

## 2022-07-08 LAB — BASIC METABOLIC PANEL WITH GFR
Anion gap: 7 (ref 5–15)
BUN: 13 mg/dL (ref 8–23)
CO2: 19 mmol/L — ABNORMAL LOW (ref 22–32)
Calcium: 7.6 mg/dL — ABNORMAL LOW (ref 8.9–10.3)
Chloride: 108 mmol/L (ref 98–111)
Creatinine, Ser: 1.53 mg/dL — ABNORMAL HIGH (ref 0.61–1.24)
GFR, Estimated: 45 mL/min — ABNORMAL LOW (ref >60)
Glucose, Bld: 247 mg/dL — ABNORMAL HIGH (ref 70–99)
Potassium: 4.3 mmol/L (ref 3.5–5.1)
Sodium: 134 mmol/L — ABNORMAL LOW (ref 135–145)

## 2022-07-08 LAB — CBC
HCT: 28 % — ABNORMAL LOW (ref 39.0–52.0)
Hemoglobin: 9.6 g/dL — ABNORMAL LOW (ref 13.0–17.0)
MCH: 31.3 pg (ref 26.0–34.0)
MCHC: 34.3 g/dL (ref 30.0–36.0)
MCV: 91.2 fL (ref 80.0–100.0)
Platelets: 100 K/uL — ABNORMAL LOW (ref 150–400)
RBC: 3.07 MIL/uL — ABNORMAL LOW (ref 4.22–5.81)
RDW: 16.1 % — ABNORMAL HIGH (ref 11.5–15.5)
WBC: 10.3 K/uL (ref 4.0–10.5)
nRBC: 0 % (ref 0.0–0.2)

## 2022-07-08 LAB — BPAM RBC
Blood Product Expiration Date: 202310042359
Blood Product Expiration Date: 202310142359
ISSUE DATE / TIME: 202309131318
ISSUE DATE / TIME: 202309131513
Unit Type and Rh: 5100
Unit Type and Rh: 5100

## 2022-07-08 LAB — GLUCOSE, CAPILLARY
Glucose-Capillary: 222 mg/dL — ABNORMAL HIGH (ref 70–99)
Glucose-Capillary: 238 mg/dL — ABNORMAL HIGH (ref 70–99)
Glucose-Capillary: 196 mg/dL — ABNORMAL HIGH (ref 70–99)

## 2022-07-08 LAB — HEPARIN LEVEL (UNFRACTIONATED)
Heparin Unfractionated: <0.1 IU/mL — ABNORMAL LOW (ref 0.30–0.70)
Heparin Unfractionated: 0.3 [IU]/mL (ref 0.30–0.70)

## 2022-07-08 MED ORDER — HEPARIN (PORCINE) 25000 UT/250ML-% IV SOLN
1750.0000 [IU]/h | INTRAVENOUS | Status: DC
  Administered 2022-07-08 – 2022-07-09 (×2): 1750 [IU]/h via INTRAVENOUS
  Filled 2022-07-08 (×2): qty 250

## 2022-07-08 NOTE — Progress Notes (Addendum)
  Progress Note    07/08/2022 7:53 AM 1 Day Post-Op  Subjective:  no complaints   Vitals:   07/08/22 0412 07/08/22 0746  BP: (!) 148/70 (!) 149/61  Pulse: 75 62  Resp: 20 18  Temp: 98.1 F (36.7 C) 98.4 F (36.9 C)  SpO2: 97% 93%   Physical Exam: Cardiac:  regular Lungs:  non labored Incisions:  left groin dressing c/d/I, ecchymosis present around groin and medial thigh along tunnel. Left medial leg incision dressing with some bloody oozing. Dressing marked Extremities:  LLE well perfused and warm with doppler PT/Pero/DP signals Abdomen:  soft, non distended Neurologic: alert and oriented  CBC    Component Value Date/Time   WBC 8.4 07/07/2022 1801   RBC 3.18 (L) 07/07/2022 1801   HGB 9.7 (L) 07/07/2022 1801   HGB 9.9 (L) 07/24/2018 1423   HCT 29.4 (L) 07/07/2022 1801   PLT 94 (L) 07/07/2022 1801   PLT 128 (L) 07/24/2018 1423   MCV 92.5 07/07/2022 1801   MCH 30.5 07/07/2022 1801   MCHC 33.0 07/07/2022 1801   RDW 15.8 (H) 07/07/2022 1801   LYMPHSABS 0.3 (L) 06/25/2022 1550   MONOABS 0.2 06/25/2022 1550   EOSABS 0.1 06/25/2022 1550   BASOSABS 0.0 06/25/2022 1550    BMET    Component Value Date/Time   NA 141 07/07/2022 1502   K 4.4 07/07/2022 1502   CL 112 (H) 07/07/2022 0413   CO2 21 (L) 07/07/2022 0413   GLUCOSE 116 (H) 07/07/2022 0413   BUN 9 07/07/2022 0413   CREATININE 1.35 (H) 07/07/2022 0413   CALCIUM 8.0 (L) 07/07/2022 0413   GFRNONAA 52 (L) 07/07/2022 0413   GFRAA 46 (L) 05/19/2020 1311    INR    Component Value Date/Time   INR 1.06 12/17/2018 1157     Intake/Output Summary (Last 24 hours) at 07/08/2022 0753 Last data filed at 07/08/2022 0416 Gross per 24 hour  Intake 2700 ml  Output 1800 ml  Net 900 ml     Assessment/Plan:  81 y.o. male is s/p left common femoral to peroneal bypass with PTFE 1 Day Post-Op   Pain well controlled Left leg well perfused and warm with doppler PT/ Pero/ DP signals Incisions are intact and well  appearing. Left peroneal incision with some bloody oozing. Dry dressings applied and site marked VSS Hemodynamically stable Okay to transition to Eliquis from vascular standpoint Mobilize as tolerated PT/OT to eval with recs  DVT prophylaxis:  Heparin gtt   Karoline Caldwell, PA-C [facility] [phone] 07/08/2022 7:53 AM  VASCULAR STAFF ADDENDUM: I have independently interviewed and examined the patient. I agree with the above.  Looks good POD#1 L CFA - peroneal bypass for chronic limb threatening ischemia.  Brisk doppler flow in the foot. Discussed with Dr. Amalia Hailey - safe to proceed with podiatric surgery at any point, inpatient or outpatient.  [name], MD [facility] Phone Number: [phone] 07/08/2022 3:17 PM

## 2022-07-08 NOTE — Progress Notes (Signed)
Mobility Specialist Progress Note:   07/08/22 1501  Mobility  Activity Ambulated with assistance in hallway  Level of Assistance Standby assist, set-up cues, supervision of patient - no hands on  Assistive Device Front wheel walker  Distance Ambulated (ft) 450 ft  Activity Response Tolerated well  $Mobility charge 1 Mobility   Pt received in bed willing to participate in mobility. No complaints of pain. Left in bed with call bell in reach and all needs met.   [facility] Surveyor, mining Chat only

## 2022-07-08 NOTE — Progress Notes (Signed)
ANTICOAGULATION CONSULT NOTE- Follow up  Pharmacy Consult for Heparin > Transition to Apixaban (Eliquis) Indication: atrial fibrillation  Allergies  Allergen Reactions   Januvia [Sitagliptin] Other (See Comments)    Soreness to stomach area. Intolerance    Statins Other (See Comments)    Myalgias, muscle weakness, swelling, pain    Patient Measurements: Height: 5\' 9"  (175.3 cm) Weight: 85.9 kg (189 lb 6 oz) IBW/kg (Calculated) : 70.7  Heparin Dosing Weight: 87 kg  Vital Signs: Temp: 98.6 F (37 C) (09/14 1110) Temp Source: Oral (09/14 1110) BP: 140/57 (09/14 1110) Pulse Rate: 63 (09/14 1110)  Labs: Recent Labs    07/06/22 0354 07/07/22 0413 07/07/22 1244 07/07/22 1502 07/07/22 1801 07/08/22 0730  HGB 7.8* 7.9*   < > 7.5* 9.7* 9.6*  HCT 23.6* 23.7*   < > 22.0* 29.4* 28.0*  PLT 108* 101*  --   --  94* 100*  HEPARINUNFRC 0.36 0.37  --   --   --  <0.10*  CREATININE 1.45* 1.35*  --   --   --  1.53*   < > = values in this interval not displayed.     Estimated Creatinine Clearance: 39.7 mL/min (A) (by C-G formula based on SCr of 1.53 mg/dL (H)).   Assessment: 81 yo male admitted for left foot wound infection on apixaban PTA for afib. Apixaban now held for pending arteriography +/- revascularization on Friday. Last dose apixaban 9/5. Pharmacy consulted to manage IV heparin while apixaban on hold.   Heparin IV stopped on 9/13 AM prior to L fem to peroneal bypass.     9/14 POD #1 , VVS noted Left medial leg incision dressing with some bloody oozing, and okay to transition to Eliquis, Cardiologist agrees with anticoatulation.   Dr. Roderic Palau, [facility] ordered to restart IV heparin infusion for now 2/2 may need procedure on foot.  Hgb 7.5> PRBCs 9/13>9.7>9.6, pltc 101>94>100  (90s-low100s)   Goal of Therapy:  Heparin level 0.3-0.7 units/ml aPTT 66-102 seconds Monitor platelets by anticoagulation protocol: Yes   Plan:  Restart Heparin IV infusion at 1750 units/hr  Check 8  hour HLl  Daily HL and CBC while on IV heparin. Follow up for restart of Elquis.   Thank you for allowing [place] to participate in this patients care.  Nicole Cella, RPh Clinical Pharmacist [phone] 07/08/2022 12:52 PM   Please refer to AMION for pharmacy phone number

## 2022-07-08 NOTE — Progress Notes (Signed)
PODIATRY PROGRESS NOTE  NAME Steve [name]. MRN [id number] DOB 08-Sep-1941 DOA 06/25/2022   CC: Osteomyelitis left foot Chief Complaint  Patient presents with   Tremors   HPI: 81 y.o. male s/p LT common femoral to peroneal bypass 1 day postop.  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  Patient is stable and resting comfortably at bedside.  Stable from a vascular standpoint with Surgical Impression of excellent Doppler flow and satisfactory pulsatile flow of the peroneal artery. Now presenting to address the osteomyelitis of the left foot  Past Medical History:  Diagnosis Date   CAD (coronary artery disease)    a. s/p CABG x 29 Nov 2011 with LIMA to LAD, SVG to OM1 and distal LCX, SVG to ramus intermediate   Essential hypertension    Hyperlipidemia    Intermittent claudication ([place])    Ischemic cardiomyopathy    a. 03/2015 Echo: EF45-50%, Gr1 DD, mild MR.   PAF (paroxysmal atrial fibrillation) (HCC)    a. post op atrial fib 11/2011; short course of amiodarone; stopped 01/25/12   Peripheral arterial disease ([place])    Right bundle branch block    Type II diabetes mellitus ([place])        Latest Ref Rng & Units 07/08/2022    7:30 AM 07/07/2022    6:01 PM 07/07/2022    3:02 PM  CBC  WBC 4.0 - 10.5 K/uL 10.3  8.4    Hemoglobin 13.0 - 17.0 g/dL 9.6  9.7  7.5   Hematocrit 39.0 - 52.0 % 28.0  29.4  22.0   Platelets 150 - 400 K/uL 100  94         Latest Ref Rng & Units 07/08/2022    7:30 AM 07/07/2022    3:02 PM 07/07/2022    2:51 PM  BMP  Glucose 70 - 99 mg/dL 247     BUN 8 - 23 mg/dL 13     Creatinine 0.61 - 1.24 mg/dL 1.53     Sodium 135 - 145 mmol/L 134  141  141   Potassium 3.5 - 5.1 mmol/L 4.3  4.4  4.3   Chloride 98 - 111 mmol/L 108     CO2 22 - 32 mmol/L 19     Calcium 8.9 - 10.3 mg/dL 7.6          Physical Exam: General: The patient is alert and oriented x3 in no acute distress.   Dermatology: Ulcer plantar aspect of the fifth MTP joint left  with serous drainage and fibrotic wound base.  Please see above noted photo  Vascular: s/p Left common femoral - peroneal artery bypass with 88mm PTFE in subcutaneous tunnel.  DOS: 07/07/2022  Neurological: Light touch and protective threshold diminished  Musculoskeletal Exam: No structural deformity noted.  No prior amputations noted  MR FOOT LT WO CONTRAST 06/27/2022 IMPRESSION: Lateral forefoot soft tissue ulcer with adjacent osteomyelitis of the fifth metatarsal head/neck and early osteomyelitis of the fifth digit proximal phalanx. No evidence of soft tissue abscess.  ASSESSMENT/PLAN OF CARE Osteomyelitis with ulcer left fifth MTP -Patient has been optimized by vascular.  Discussed with Dr. Standley Dakins and okay from his standpoint to proceed with partial fifth ray amputation.  This was discussed with both the patient and his son, [name].  Both are in agreement to proceed with the partial fifth ray amputation to eradicate and surgically cure the osteomyelitis while the patient is inpatient -Preoperative orders placed.  Surgery will consist of partial  fifth ray amputation left foot.  N.p.o. after midnight -Will tentatively plan for tomorrow, 07/09/2022, afternoon pending OR availability    Please contact me directly with any questions or concerns.     Edrick Kins, DPM [facility]  Dr. Edrick Kins, DPM    [address], [place] [address]                Office [phone]  Fax [phone]

## 2022-07-08 NOTE — Progress Notes (Signed)
Physical Therapy Treatment Patient Details Name: Steve [name]. MRN: [id number] DOB: Jan 18, 1941 Today's Date: 07/08/2022   History of Present Illness Pt is an 81 y.o. male admitted from [facility] on 06/25/22 with tremors, feeling poorly. Workup for AKI, hyponatremia, sepsis, L foot nonhealing wound. S/p aortogram, LLE angiogram 9/8. 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 PMH includes CAD s/p CABG, HTN, HLD, DM, PAD, afib.   PT Comments    Pt progressing with mobility, now s/p LLE revascularization 07/07/22. Pt performing mobility and ADL tasks with RW at supervision-level. Pt remains limited by generalized weakness, decreased activity tolerance, and impaired balance strategies/postural reactions. Will continue to follow acutely to address established goals.    Recommendations for follow up therapy are one component of a multi-disciplinary discharge planning process, led by the attending physician.  Recommendations may be updated based on patient status, additional functional criteria and insurance authorization.  Follow Up Recommendations  Home health PT     Assistance Recommended at Discharge PRN  Patient can return home with the following Assistance with cooking/housework;Assist for transportation;Help with stairs or ramp for entrance   Equipment Recommendations  None recommended by PT    Recommendations for Other Services       Precautions / Restrictions Precautions Precautions: Fall Restrictions Weight Bearing Restrictions: No     Mobility  Bed Mobility Overal bed mobility: Modified Independent             General bed mobility comments: return to supine mod indep with HOB elevated    Transfers Overall transfer level: Needs assistance Equipment used: Rolling walker (2 wheels) Transfers: Sit to/from Stand Sit to Stand: Supervision           General transfer comment: able to stand from low toilet height (with grab bar) and EOB to RW with  supervision for safety    Ambulation/Gait Ambulation/Gait assistance: Min guard, Supervision Gait Distance (Feet): 340 Feet Assistive device: Rolling walker (2 wheels) Gait Pattern/deviations: Step-through pattern, Decreased stride length, Antalgic Gait velocity: Decreased     General Gait Details: slow, antalgic gait with RW and initial min guard for balance progressing to supervision for safety/lines   Stairs             Wheelchair Mobility    Modified Rankin (Stroke Patients Only)       Balance Overall balance assessment: Needs assistance Sitting-balance support: No upper extremity supported, Feet supported Sitting balance-Leahy Scale: Good     Standing balance support: No upper extremity supported, During functional activity Standing balance-Leahy Scale: Fair Standing balance comment: can static stand without UE support for ADL tasks at sink                            Cognition Arousal/Alertness: Awake/alert                                              Exercises      General Comments        Pertinent Vitals/Pain Pain Assessment Pain Assessment: Faces Faces Pain Scale: Hurts a little bit Pain Location: LLE Pain Descriptors / Indicators: Discomfort, Sore Pain Intervention(s): Monitored during session    Home Living  Prior Function            PT Goals (current goals can now be found in the care plan section) Progress towards PT goals: Progressing toward goals    Frequency    Min 3X/week      PT Plan Current plan remains appropriate    Co-evaluation              AM-PAC PT "6 Clicks" Mobility   Outcome Measure  Help needed turning from your back to your side while in a flat bed without using bedrails?: None Help needed moving from lying on your back to sitting on the side of a flat bed without using bedrails?: None Help needed moving to and from a bed to a chair  (including a wheelchair)?: A Little Help needed standing up from a chair using your arms (e.g., wheelchair or bedside chair)?: A Little Help needed to walk in hospital room?: A Little Help needed climbing 3-5 steps with a railing? : A Little 6 Click Score: 20    End of Session Equipment Utilized During Treatment: Gait belt Activity Tolerance: Patient tolerated treatment well Patient left: in bed;with call bell/phone within reach;with bed alarm set Nurse Communication: Mobility status PT Visit Diagnosis: Difficulty in walking, not elsewhere classified (R26.2)     Time: 4034-7425 PT Time Calculation (min) (ACUTE ONLY): 11 min  Charges:  $Gait Training: 8-22 mins                      Mabeline Caras, PT, DPT Acute Rehabilitation Services  Personal: [facility] Office: [place] 07/08/2022, 11:26 AM

## 2022-07-08 NOTE — Progress Notes (Signed)
Rounding Note    Patient Name: Steve [name]. Date of Encounter: 07/08/2022  [facility] Cardiologist: Peter Martinique, MD   Subjective   Patient was seen and examined at his bedside. He was eating breakfast when I arrived. No complaints.   Inpatient Medications    Scheduled Meds:  acetaminophen  650 mg Oral Q6H   aspirin EC  81 mg Oral Daily   atorvastatin  40 mg Oral Daily   Chlorhexidine Gluconate Cloth  6 each Topical Daily   docusate sodium  100 mg Oral BID   doxazosin  4 mg Oral Daily   ezetimibe  10 mg Oral Daily   feeding supplement  237 mL Oral TID BM   gabapentin  100 mg Oral QHS   hydrALAZINE  25 mg Oral Q8H   insulin aspart  0-15 Units Subcutaneous TID WC   insulin aspart  0-5 Units Subcutaneous QHS   insulin detemir  15 Units Subcutaneous Daily   isosorbide mononitrate  30 mg Oral Daily   metoprolol succinate  100 mg Oral Q supper   multivitamin with minerals  1 tablet Oral Daily   sodium chloride flush  3 mL Intravenous Q12H   sodium chloride flush  3 mL Intravenous Q12H   Continuous Infusions:  sodium chloride 250 mL (07/07/22 2256)    ceFAZolin (ANCEF) IV 2 g (07/08/22 0938)   heparin Stopped (07/07/22 1045)   PRN Meds: sodium chloride, acetaminophen **OR** acetaminophen, albuterol, bisacodyl, cyclobenzaprine, hydrALAZINE, labetalol, lidocaine, LORazepam **OR** LORazepam, morphine injection, ondansetron **OR** ondansetron (ZOFRAN) IV, oxyCODONE, polyethylene glycol, sodium chloride flush   Vital Signs    Vitals:   07/07/22 2354 07/08/22 0412 07/08/22 0413 07/08/22 0746  BP: (!) 164/77 (!) 148/70  (!) 149/61  Pulse: 64 75  62  Resp: 16 20  18   Temp: 98 F (36.7 C) 98.1 F (36.7 C)  98.4 F (36.9 C)  TempSrc: Oral Oral  Oral  SpO2: 97% 97%  93%  Weight:   85.9 kg   Height:        Intake/Output Summary (Last 24 hours) at 07/08/2022 0825 Last data filed at 07/08/2022 0416 Gross per 24 hour  Intake 2700 ml  Output 1800 ml  Net 900  ml      07/08/2022    4:13 AM 07/07/2022   11:01 AM 07/07/2022    4:17 AM  Last 3 Weights  Weight (lbs) 189 lb 6 oz 187 lb 13.3 oz 187 lb 13.3 oz  Weight (kg) 85.9 kg 85.2 kg 85.2 kg      Telemetry     - Personally Reviewed  ECG    None today  - Personally Reviewed  Physical Exam   GEN: No acute distress.   Neck: No JVD Cardiac: RRR, no murmurs, rubs, or gallops.  Respiratory: Clear to auscultation bilaterally. GI: Soft, nontender, non-distended  MS: No edema; No deformity. Neuro:  Nonfocal  Psych: Normal affect   Labs    High Sensitivity Troponin:  No results for input(s): "TROPONINIHS" in the last 720 hours.   Chemistry Recent Labs  Lab 07/05/22 0250 07/06/22 0354 07/07/22 0413 07/07/22 1244 07/07/22 1451 07/07/22 1502  NA 138 139 139 141 141 141  K 3.0* 3.7 3.5 3.7 4.3 4.4  CL 111 113* 112*  --   --   --   CO2 19* 21* 21*  --   --   --   GLUCOSE 118* 121* 116*  --   --   --  BUN 12 10 9   --   --   --   CREATININE 1.49* 1.45* 1.35*  --   --   --   CALCIUM 7.9* 8.1* 8.0*  --   --   --   ALBUMIN 2.5* 2.5* 2.4*  --   --   --   GFRNONAA 46* 48* 52*  --   --   --   ANIONGAP 8 5 6   --   --   --     Lipids  Recent Labs  Lab 07/03/22 0607  CHOL 98  TRIG 170*  HDL 17*  LDLCALC 47  CHOLHDL 5.8    Hematology Recent Labs  Lab 07/07/22 0413 07/07/22 1244 07/07/22 1502 07/07/22 1801 07/08/22 0730  WBC 4.9  --   --  8.4 10.3  RBC 2.53*  --   --  3.18* 3.07*  HGB 7.9*   < > 7.5* 9.7* 9.6*  HCT 23.7*   < > 22.0* 29.4* 28.0*  MCV 93.7  --   --  92.5 91.2  MCH 31.2  --   --  30.5 31.3  MCHC 33.3  --   --  33.0 34.3  RDW 15.8*  --   --  15.8* 16.1*  PLT 101*  --   --  94* 100*   < > = values in this interval not displayed.   Thyroid No results for input(s): "TSH", "FREET4" in the last 168 hours.  BNPNo results for input(s): "BNP", "PROBNP" in the last 168 hours.  DDimer No results for input(s): "DDIMER" in the last 168 hours.   Radiology    No  results found.  Cardiac Studies   Echo: 11/2018   IMPRESSIONS     1. The left ventricle has moderately reduced systolic function, with an  ejection fraction of 35-40%. The cavity size was mildly dilated. Left  ventricular diastolic Doppler parameters are consistent with impaired  relaxation.   2. Moderate hypokinesis of the basal left ventricular inferoseptal wall.   3. The right ventricle has normal systolic function. The cavity was  normal. There is no increase in right ventricular wall thickness. Right  ventricular systolic pressure could not be assessed.   4. The mitral valve is normal in structure. Mild calcification of the  mitral valve leaflet. There is mild mitral annular calcification present.  Mitral valve regurgitation is mild to moderate by color flow Doppler.   5. The tricuspid valve is normal in structure.   6. The aortic valve is tricuspid.   7. The aortic root is normal in size and structure.   8. There is mild dilatation of the ascending aorta.   FINDINGS   Left Ventricle: The left ventricle has moderately reduced systolic  function, with an ejection fraction of 35-40%. The cavity size was mildly  dilated. There is borderline increase in left ventricular wall thickness.  Left ventricular diastolic Doppler  parameters are consistent with impaired relaxation Moderate hypokinesis of  the left ventricular inferoseptal wall.  Right Ventricle: The right ventricle has normal systolic function. The  cavity was normal. There is no increase in right ventricular wall  thickness. Right ventricular systolic pressure could not be assessed.  Left Atrium: left atrial size was normal in size  Right Atrium: right atrial size was normal in size.  Interatrial Septum: No atrial level shunt detected by color flow Doppler.  Pericardium: There is no evidence of pericardial effusion.  Mitral Valve: The mitral valve is normal in structure. Mild calcification  of the mitral valve  leaflet. There is mild mitral annular calcification  present. Mitral valve regurgitation is mild to moderate by color flow  Doppler.  Tricuspid Valve: The tricuspid valve is normal in structure. Tricuspid  valve regurgitation is trivial by color flow Doppler.  Aortic Valve: The aortic valve is tricuspid Aortic valve regurgitation was  not visualized by color flow Doppler.  Pulmonic Valve: The pulmonic valve was grossly normal. Pulmonic valve  regurgitation is trivial by color flow Doppler.  Aorta: The aortic root is normal in size and structure. There is mild  dilatation of the ascending aorta.  Venous: The inferior vena cava was not well visualized.   Patient Profile     81 y.o. male of CAD s/p CABG '13 (LIMA to LAD, SVG to OM1, SVG to distal circumflex and SVG to ramus), hypertension, hyperlipidemia, postop atrial fibrillation, diabetes, intermittent claudication and PVD   Assessment & Plan    Preop Evaluation Left LE wound/osteomyelitis  PAD CAD s/p CABG HFrEF ICM Hypertension  Hyperlipidemia  Paroxysmal Afib   Status post left common femoral to peroneal bypass with PTFE - post op day one.  Hypertensive- increase his hydralazine to 50 mg every 8 hrs. Continue his metoprolol. No angina symptoms - continue same CAD med regimen. Appears clinically Euvolemic.  PAF- in sinus rhythm - continue rate control and anticoagulation.   For questions or updates, please contact [name] Please consult [email or web address] for contact info under        Signed, Kailan Laws, DO  07/08/2022, 8:25 AM

## 2022-07-08 NOTE — Progress Notes (Signed)
Occupational Therapy Treatment Patient Details Name: Steve [name]. MRN: [id number] DOB: 12-Nov-1940 Today's Date: 07/08/2022   History of present illness Pt is an 81 y.o. male admitted from [facility] on 06/25/22 with tremors, feeling poorly. Workup for AKI, hyponatremia, sepsis, L foot nonhealing wound. S/p aortogram, LLE angiogram 9/8. 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 PMH includes CAD s/p CABG, HTN, HLD, DM, PAD, afib.   OT comments  Pt was seen for ADL retraining session with focus on functional mobility, UB/LB dressing, grooming standing at sink and toilet transfer. He is progressing toward acute OT goals related to ADL and selfcare tasks as well as functional mobility and transfers. He is currently supervision level for grooming standing at sink and for toilet transfers using RW. He is Mod I for UB dressing in sitting and Mod I/set-up for LB dressing sitting at EOB to don/doff socks. He denies pain but does report "tenderness" that he rates as 0/10 in LLE & groin area. Recommend continued acute OT with overall Mod I goals.   Recommendations for follow up therapy are one component of a multi-disciplinary discharge planning process, led by the attending physician.  Recommendations may be updated based on patient status, additional functional criteria and insurance authorization.    Follow Up Recommendations  Home health OT    Assistance Recommended at Discharge Set up Supervision/Assistance  Patient can return home with the following  A little help with walking and/or transfers;A little help with bathing/dressing/bathroom;Direct supervision/assist for medications management;Direct supervision/assist for financial management;Help with stairs or ramp for entrance   Equipment Recommendations  None recommended by OT    Recommendations for Other Services      Precautions / Restrictions Precautions Precautions: Fall Restrictions Weight Bearing Restrictions: No        Mobility Bed Mobility Overal bed mobility: Modified Independent    Transfers Overall transfer level: Needs assistance Equipment used: Rolling walker (2 wheels) Transfers: Sit to/from Stand Sit to Stand: Supervision   General transfer comment: able to stand from low toilet height (with grab bar) and EOB to RW with supervision for safety     Balance Overall balance assessment: Needs assistance Sitting-balance support: No upper extremity supported, Feet supported Sitting balance-Leahy Scale: Good   Standing balance support: No upper extremity supported, During functional activity Standing balance-Leahy Scale: Fair Standing balance comment: can static stand without UE support for ADL grooming tasks at sink       ADL either performed or assessed with clinical judgement   ADL Overall ADL's : Needs assistance/impaired     Grooming: Wash/dry hands;Wash/dry face;Oral care;Standing;Supervision/safety Grooming Details (indicate cue type and reason): standing at sink   Upper Body Dressing : Modified independent;Sitting Upper Body Dressing Details (indicate cue type and reason): Sitting EOB Lower Body Dressing: Modified independent;Set up;Sitting/lateral leans Lower Body Dressing Details (indicate cue type and reason): Don/doff socks sitting EOB Toilet Transfer: Supervision/safety;Ambulation;Regular Toilet;Rolling walker (2 wheels);Grab bars   Toileting- Clothing Manipulation and Hygiene: Supervision/safety;Sit to/from stand;Sitting/lateral lean   Functional mobility during ADLs: Supervision/safety;Rolling walker (2 wheels) General ADL Comments: Pt was seen for ADL retraining session with focus on functional mobility, UB/LB dressing, grooming standing at sink and toilet transfer. He is progressing toward acute OT goals related to ADL and selfcare tasks as well as functional mobility and transfers. He is currently supervision level for grooming standing at sink and for toilet transfers  using RW. He is Mod I for UB dressing in sitting and Mod I/set-up for LB  dressing sitting at EOB to don/doff socks. He denies pain but does report "tenderness" that he rates as 0/10 in LLE groin area. Recommend continued acute OT with overall Mod I goals.    Extremity/Trunk Assessment Upper Extremity Assessment Upper Extremity Assessment: Overall WFL for tasks assessed   Lower Extremity Assessment Lower Extremity Assessment: Defer to PT evaluation        Vision Baseline Vision/History: 1 Wears glasses Patient Visual Report: No change from baseline            Cognition   Behavior During Therapy: WFL for tasks assessed/performed Overall Cognitive Status: Within Functional Limits for tasks assessed                   Pertinent Vitals/ Pain       Pain Assessment Pain Assessment: No/denies pain Faces Pain Scale: No hurt  Home Living  Pt is from ILF and plans to d/c back when medically able.      Prior Functioning/Environment   Mod I ADL's at ILF per pt report. Please refer to initial OT assessment for details.   Frequency  Min 2X/week        Progress Toward Goals  OT Goals(current goals can now be found in the care plan section)  Progress towards OT goals: Progressing toward goals  Acute Rehab OT Goals Patient Stated Goal: Go home OT Goal Formulation: With patient Time For Goal Achievement: 07/10/22 Potential to Achieve Goals: Good  Plan Discharge plan remains appropriate    AM-PAC OT "6 Clicks" Daily Activity     Outcome Measure   Help from another person eating meals?: None Help from another person taking care of personal grooming?: A Little Help from another person toileting, which includes using toliet, bedpan, or urinal?: A Little Help from another person bathing (including washing, rinsing, drying)?: A Little Help from another person to put on and taking off regular upper body clothing?: None Help from another person to put on and taking off regular  lower body clothing?: A Little 6 Click Score: 20    End of Session Equipment Utilized During Treatment: Rolling walker (2 wheels)  OT Visit Diagnosis: Unsteadiness on feet (R26.81);Other abnormalities of gait and mobility (R26.89);Muscle weakness (generalized) (M62.81)   Activity Tolerance Patient tolerated treatment well   Patient Left in bed;with call bell/phone within reach   Nurse Communication Mobility status        Time: 3491-7915 OT Time Calculation (min): 18 min  Charges: OT General Charges $OT Visit: 1 Visit OT Treatments $Self Care/Home Management : 8-22 mins  Almyra Deforest, OT 07/08/2022, 11:38 AM

## 2022-07-08 NOTE — H&P (View-Only) (Signed)
PODIATRY PROGRESS NOTE  NAME Steve [name]. MRN [id number] DOB 1941-04-21 DOA 06/25/2022   CC: Osteomyelitis left foot Chief Complaint  Patient presents with   Tremors   HPI: 81 y.o. male s/p LT common femoral to peroneal bypass 1 day postop.  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  Patient is stable and resting comfortably at bedside.  Stable from a vascular standpoint with Surgical Impression of excellent Doppler flow and satisfactory pulsatile flow of the peroneal artery. Now presenting to address the osteomyelitis of the left foot  Past Medical History:  Diagnosis Date   CAD (coronary artery disease)    a. s/p CABG x 29 Nov 2011 with LIMA to LAD, SVG to OM1 and distal LCX, SVG to ramus intermediate   Essential hypertension    Hyperlipidemia    Intermittent claudication ([place])    Ischemic cardiomyopathy    a. 03/2015 Echo: EF45-50%, Gr1 DD, mild MR.   PAF (paroxysmal atrial fibrillation) ([facility])    a. post op atrial fib 11/2011; short course of amiodarone; stopped 01/25/12   Peripheral arterial disease ([place])    Right bundle branch block    Type II diabetes mellitus ([place])        Latest Ref Rng & Units 07/08/2022    7:30 AM 07/07/2022    6:01 PM 07/07/2022    3:02 PM  CBC  WBC 4.0 - 10.5 K/uL 10.3  8.4    Hemoglobin 13.0 - 17.0 g/dL 9.6  9.7  7.5   Hematocrit 39.0 - 52.0 % 28.0  29.4  22.0   Platelets 150 - 400 K/uL 100  94         Latest Ref Rng & Units 07/08/2022    7:30 AM 07/07/2022    3:02 PM 07/07/2022    2:51 PM  BMP  Glucose 70 - 99 mg/dL 247     BUN 8 - 23 mg/dL 13     Creatinine 0.61 - 1.24 mg/dL 1.53     Sodium 135 - 145 mmol/L 134  141  141   Potassium 3.5 - 5.1 mmol/L 4.3  4.4  4.3   Chloride 98 - 111 mmol/L 108     CO2 22 - 32 mmol/L 19     Calcium 8.9 - 10.3 mg/dL 7.6          Physical Exam: General: The patient is alert and oriented x3 in no acute distress.   Dermatology: Ulcer plantar aspect of the fifth MTP joint left  with serous drainage and fibrotic wound base.  Please see above noted photo  Vascular: s/p Left common femoral - peroneal artery bypass with 75mm PTFE in subcutaneous tunnel.  DOS: 07/07/2022  Neurological: Light touch and protective threshold diminished  Musculoskeletal Exam: No structural deformity noted.  No prior amputations noted  MR FOOT LT WO CONTRAST 06/27/2022 IMPRESSION: Lateral forefoot soft tissue ulcer with adjacent osteomyelitis of the fifth metatarsal head/neck and early osteomyelitis of the fifth digit proximal phalanx. No evidence of soft tissue abscess.  ASSESSMENT/PLAN OF CARE Osteomyelitis with ulcer left fifth MTP -Patient has been optimized by vascular.  Discussed with Dr. Standley Dakins and okay from his standpoint to proceed with partial fifth ray amputation.  This was discussed with both the patient and his son, [name].  Both are in agreement to proceed with the partial fifth ray amputation to eradicate and surgically cure the osteomyelitis while the patient is inpatient -Preoperative orders placed.  Surgery will consist of partial  fifth ray amputation left foot.  N.p.o. after midnight -Will tentatively plan for tomorrow, 07/09/2022, afternoon pending OR availability    Please contact me directly with any questions or concerns.     Edrick Kins, DPM [facility]  Dr. Edrick Kins, DPM    [address], [place] [address]                Office [phone]  Fax [phone]

## 2022-07-08 NOTE — Progress Notes (Addendum)
ANTICOAGULATION CONSULT NOTE- Follow up  Pharmacy Consult for Heparin (Eliquis on hold) Indication: atrial fibrillation  Allergies  Allergen Reactions   Januvia [Sitagliptin] Other (See Comments)    Soreness to stomach area. Intolerance    Statins Other (See Comments)    Myalgias, muscle weakness, swelling, pain    Patient Measurements: Height: 5\' 9"  (175.3 cm) Weight: 85.9 kg (189 lb 6 oz) IBW/kg (Calculated) : 70.7  Heparin Dosing Weight: 87 kg  Vital Signs: Temp: 98.7 F (37.1 C) (09/14 1836) Temp Source: Oral (09/14 1836) BP: 166/60 (09/14 1836) Pulse Rate: 63 (09/14 1836)  Labs: Recent Labs    07/06/22 0354 07/07/22 0413 07/07/22 1244 07/07/22 1502 07/07/22 1801 07/08/22 0730 07/08/22 1922  HGB 7.8* 7.9*   < > 7.5* 9.7* 9.6*  --   HCT 23.6* 23.7*   < > 22.0* 29.4* 28.0*  --   PLT 108* 101*  --   --  94* 100*  --   HEPARINUNFRC 0.36 0.37  --   --   --  <0.10* 0.30  CREATININE 1.45* 1.35*  --   --   --  1.53*  --    < > = values in this interval not displayed.     Estimated Creatinine Clearance: 39.7 mL/min (A) (by C-G formula based on SCr of 1.53 mg/dL (H)).   Assessment: 81 yo male admitted for left foot wound infection on apixaban PTA for afib. Apixaban now held for pending arteriography +/- revascularization on Friday. Last dose apixaban 9/5. Pharmacy consulted to manage IV heparin while apixaban on hold.   Heparin IV stopped on 9/13 AM prior to L fem to peroneal bypass.     9/14 POD #1 , VVS noted Left medial leg incision dressing with some bloody oozing, and okay to transition to Eliquis, Cardiologist agrees with anticoatulation.   Dr. Roderic Palau, [facility] ordered to restart IV heparin infusion for now 2/2 may need procedure on foot.  Hgb 7.5> PRBCs 9/13>9.7>9.6, pltc 101>94>100  (90s-low100s)  Heparin level this evening is in goal range.  No overt bleeding or complications noted.  CBC stable.   Goal of Therapy:  Heparin level 0.3-0.7 units/ml aPTT 66-102  seconds Monitor platelets by anticoagulation protocol: Yes   Plan:  Continue IV Heparin infusion at 1750 units/hr  Check 8 hour HL with AM labs. Daily HL and CBC while on IV heparin. Follow up for restart of Eliquis.   Thank you for allowing [place] to participate in this patients care.  [place] Crane, Roylene Reason, BCCP Clinical Pharmacist  07/08/2022 8:21 PM   [facility] pharmacy phone numbers are listed on [email or web address]

## 2022-07-08 NOTE — Progress Notes (Addendum)
Progress Note Patient: Steve [name]. ETK:[id number] DOB: 1941/10/11 DOA: 06/25/2022  DOS: the patient was seen and examined on 07/08/2022  Brief hospital course: Steve [name]. is a 81 y.o. male with a history of CAD s/p CABG, hypertension, hyperlipidemia, diabetes mellitus type 2, PAD, atrial fibrillation. Patient presented secondary to tremors and was found to have fevers with concern for possible infection. SIRS criteria met without source identified. Patient started empirically on Vancomycin, Cefepime and Flagyl. Blood and urine cultures obtained. MRI significant for osteomyelitis. Podiatry and vascular surgery consulted. S/P angiogram on 9/8.  Recommendation is for femoropopliteal bypass currently scheduled on 9/13.  Will remain on IV heparin until then.  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  Associated with cellulitis. Osteomyelitis of the fifth metatarsal head/neck in addition to early osteomyelitis of the fifth digit proximal phalanx on MRI from 9/3. Blood and urine cultures are no growth.  Empiric Vancomycin, Cefepime and Flagyl initiated on admission and patient has been deescalated to Cefepime.   As of 9/6 now on cefazolin. -Continue cefazolin  -podiatry following and plans are for partial 5th ray amputation of left foot on 9/15 -We will discuss with ID after podiatry procedure with regards to type and duration of the antibiotic treatment   AKI on CKD stage IIIa Patient's baseline creatinine appears to be around 1.2. Creatinine of 2.47 on admission. Treated with IV fluids. Mild worsening after angiography.  Currently creatinine down to 1.3 Monitor.   Hyponatremia Sodium of 126 on admission. Likely related to dehydration.  Resolved    Acute urinary retention Unsure of etiology.  Patient appears to be asymptomatic.  Urinalysis is not suggestive of infection. Patient  with continued urinary retention -Foley catheter inserted on 9/3.  Attempt voiding trial on 9/16 after foot surgery. -Outpatient urology follow-up   Constipation Resolved.  Continue current bowel regimen.   Diabetes mellitus type 2 uncontrolled with hyperglycemia with nonhealing wounds. Patient is on Januvia, Levemir and metformin as an outpatient. Most recent hemoglobin A1C is 7.9%. -Continue Levemir and SSI Blood sugar stable   CAD Hyperlipidemia PAD No chest pain. Stable. Continue Zetia, on Repatha outpatient. ABI significantly abnormal.  Angiography shows evidence of significant stenotic disease. S/p left common femoral-peroneal artery bypass   Primary hypertension -Continue Imdur and Toprol XL   Paroxysmal atrial fibrillation -Patient was on Eliquis and Toprol XL Currently on IV heparin. -We will hold heparin at midnight for anticipated procedure tomorrow   Thrombocytopenia No current bleeding. stable.   Acute encephalopathy. Mostly delirium Etiology not clear  most likely combination of prolonged hospitalization and medication side effect.  Unable to differentiate further whether metabolic or toxic. Now resolved. Monitor.   Prolonged QTc. QTc is 501. Monitor for now.  Avoid QT prolonging medications.  Right ankle pain. Patient reports that his right ankle is also hurting significantly for last few days. X-ray ankle negative for any acute abnormality.  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  Physical Exam: Vitals:   07/08/22 0413 07/08/22 0746 07/08/22 1110 07/08/22 1334  BP:  (!) 149/61 (!) 140/57 (!) 142/60  Pulse:  62 63  Resp:  18 18   Temp:  98.4 F  (36.9 C) 98.6 F (37 C)   TempSrc:  Oral Oral   SpO2:  93% 97%   Weight: 85.9 kg     Height:       General: sitting in up bed, no distress, skin appears pale Cardiovascular: S1 and S2 Present, aortic systolic Murmur, Respiratory: good respiratory effort, Bilateral Air entry present and CTA, no Crackles, no wheezes Abdomen: Bowel Sound present, Non tender Extremities: no Pedal edema Neurology: alert and oriented to Self, Place and time.   Data Reviewed: I have Reviewed nursing notes, Vitals, and Lab results since pt's last encounter. Pertinent lab results BMP I have ordered test including CBC and BMP    Family Communication: discussed with son at the bedside 9/13  Disposition: Status is: Inpatient Remains inpatient appropriate because: Scheduled for procedure on Friday.  Monitor postop course.  Author: Kathie Dike, MD 07/08/2022 5:24 PM  Please look on [email or web address] to find out who is on call.

## 2022-07-09 ENCOUNTER — Inpatient Hospital Stay (HOSPITAL_COMMUNITY): Payer: Medicare Other | Admitting: General Practice

## 2022-07-09 ENCOUNTER — Inpatient Hospital Stay (HOSPITAL_COMMUNITY): Payer: Medicare Other

## 2022-07-09 ENCOUNTER — Other Ambulatory Visit: Payer: Self-pay

## 2022-07-09 ENCOUNTER — Encounter (HOSPITAL_COMMUNITY)
Admission: EM | Disposition: A | Discharge status: Home Health | Payer: Self-pay | Source: Home / Self Care | Attending: Internal Medicine

## 2022-07-09 ENCOUNTER — Encounter (HOSPITAL_COMMUNITY): Payer: Self-pay | Admitting: Internal Medicine

## 2022-07-09 DIAGNOSIS — E1169 Type 2 diabetes mellitus with other specified complication: Secondary | ICD-10-CM

## 2022-07-09 DIAGNOSIS — Z7984 Long term (current) use of oral hypoglycemic drugs: Secondary | ICD-10-CM | POA: Diagnosis not present

## 2022-07-09 DIAGNOSIS — E860 Dehydration: Secondary | ICD-10-CM | POA: Diagnosis not present

## 2022-07-09 DIAGNOSIS — Z794 Long term (current) use of insulin: Secondary | ICD-10-CM | POA: Diagnosis not present

## 2022-07-09 DIAGNOSIS — E871 Hypo-osmolality and hyponatremia: Secondary | ICD-10-CM | POA: Diagnosis not present

## 2022-07-09 DIAGNOSIS — I4891 Unspecified atrial fibrillation: Secondary | ICD-10-CM

## 2022-07-09 DIAGNOSIS — M869 Osteomyelitis, unspecified: Secondary | ICD-10-CM

## 2022-07-09 DIAGNOSIS — N179 Acute kidney failure, unspecified: Secondary | ICD-10-CM | POA: Diagnosis not present

## 2022-07-09 DIAGNOSIS — M86672 Other chronic osteomyelitis, left ankle and foot: Secondary | ICD-10-CM

## 2022-07-09 DIAGNOSIS — A419 Sepsis, unspecified organism: Secondary | ICD-10-CM | POA: Diagnosis not present

## 2022-07-09 HISTORY — PX: AMPUTATION: SHX166

## 2022-07-09 LAB — CBC
HCT: 26.7 % — ABNORMAL LOW (ref 39.0–52.0)
Hemoglobin: 9 g/dL — ABNORMAL LOW (ref 13.0–17.0)
MCH: 31.5 pg (ref 26.0–34.0)
MCHC: 33.7 g/dL (ref 30.0–36.0)
MCV: 93.4 fL (ref 80.0–100.0)
Platelets: 94 10*3/uL — ABNORMAL LOW (ref 150–400)
RBC: 2.86 MIL/uL — ABNORMAL LOW (ref 4.22–5.81)
RDW: 16.3 % — ABNORMAL HIGH (ref 11.5–15.5)
WBC: 9 10*3/uL (ref 4.0–10.5)
nRBC: 0 % (ref 0.0–0.2)

## 2022-07-09 LAB — GLUCOSE, CAPILLARY
Glucose-Capillary: 197 mg/dL — ABNORMAL HIGH (ref 70–99)
Glucose-Capillary: 153 mg/dL — ABNORMAL HIGH (ref 70–99)
Glucose-Capillary: 135 mg/dL — ABNORMAL HIGH (ref 70–99)
Glucose-Capillary: 112 mg/dL — ABNORMAL HIGH (ref 70–99)
Glucose-Capillary: 123 mg/dL — ABNORMAL HIGH (ref 70–99)
Glucose-Capillary: 119 mg/dL — ABNORMAL HIGH (ref 70–99)
Glucose-Capillary: 149 mg/dL — ABNORMAL HIGH (ref 70–99)
Glucose-Capillary: 274 mg/dL — ABNORMAL HIGH (ref 70–99)

## 2022-07-09 LAB — BASIC METABOLIC PANEL
Anion gap: 9 (ref 5–15)
BUN: 18 mg/dL (ref 8–23)
CO2: 22 mmol/L (ref 22–32)
Calcium: 8.4 mg/dL — ABNORMAL LOW (ref 8.9–10.3)
Chloride: 110 mmol/L (ref 98–111)
Creatinine, Ser: 1.48 mg/dL — ABNORMAL HIGH (ref 0.61–1.24)
GFR, Estimated: 47 mL/min — ABNORMAL LOW (ref >60)
Glucose, Bld: 155 mg/dL — ABNORMAL HIGH (ref 70–99)
Potassium: 4.1 mmol/L (ref 3.5–5.1)
Sodium: 141 mmol/L (ref 135–145)

## 2022-07-09 LAB — HEPARIN LEVEL (UNFRACTIONATED): Heparin Unfractionated: 0.33 IU/mL (ref 0.30–0.70)

## 2022-07-09 SURGERY — AMPUTATION, FOOT, RAY
Anesthesia: Monitor Anesthesia Care | Site: Foot | Laterality: Left

## 2022-07-09 MED ORDER — ORAL CARE MOUTH RINSE: 15.0000 mL | Freq: Once | OROMUCOSAL | Status: AC

## 2022-07-09 MED ORDER — DEXAMETHASONE SODIUM PHOSPHATE 10 MG/ML IJ SOLN
INTRAMUSCULAR | Status: AC
  Filled 2022-07-09: qty 1

## 2022-07-09 MED ORDER — CHLORHEXIDINE GLUCONATE 0.12 % MT SOLN
OROMUCOSAL | Status: AC
  Administered 2022-07-09: 15 mL via OROMUCOSAL
  Filled 2022-07-09: qty 15

## 2022-07-09 MED ORDER — ONDANSETRON HCL 4 MG/2ML IJ SOLN
INTRAMUSCULAR | Status: AC
  Filled 2022-07-09: qty 2

## 2022-07-09 MED ORDER — LIDOCAINE 2% (20 MG/ML) 5 ML SYRINGE
INTRAMUSCULAR | Status: AC
  Filled 2022-07-09: qty 5

## 2022-07-09 MED ORDER — FENTANYL CITRATE (PF) 250 MCG/5ML IJ SOLN
INTRAMUSCULAR | Status: DC | PRN
  Administered 2022-07-09: 25 ug via INTRAVENOUS

## 2022-07-09 MED ORDER — LIDOCAINE-EPINEPHRINE 2 %-1:100000 IJ SOLN
INTRAMUSCULAR | Status: AC
  Filled 2022-07-09: qty 1

## 2022-07-09 MED ORDER — LIDOCAINE-EPINEPHRINE 2 %-1:100000 IJ SOLN
INTRAMUSCULAR | Status: DC | PRN
  Administered 2022-07-09: 20 mL

## 2022-07-09 MED ORDER — ONDANSETRON HCL 4 MG/2ML IJ SOLN
INTRAMUSCULAR | Status: DC | PRN
  Administered 2022-07-09: 4 mg via INTRAVENOUS

## 2022-07-09 MED ORDER — 0.9 % SODIUM CHLORIDE (POUR BTL) OPTIME
TOPICAL | Status: DC | PRN
  Administered 2022-07-09: 1000 mL

## 2022-07-09 MED ORDER — LIDOCAINE HCL 1 % IJ SOLN
INTRAMUSCULAR | Status: AC
  Filled 2022-07-09: qty 20

## 2022-07-09 MED ORDER — PROPOFOL 10 MG/ML IV BOLUS
INTRAVENOUS | Status: DC | PRN
  Administered 2022-07-09: 20 mg via INTRAVENOUS

## 2022-07-09 MED ORDER — BUPIVACAINE HCL 0.5 % IJ SOLN
INTRAMUSCULAR | Status: AC
  Filled 2022-07-09: qty 1

## 2022-07-09 MED ORDER — LIDOCAINE HCL 2 % IJ SOLN
INTRAMUSCULAR | Status: AC
  Filled 2022-07-09: qty 20

## 2022-07-09 MED ORDER — FENTANYL CITRATE (PF) 100 MCG/2ML IJ SOLN: 25.0000 ug | INTRAMUSCULAR | Status: DC | PRN

## 2022-07-09 MED ORDER — INSULIN ASPART 100 UNIT/ML IJ SOLN: 0.0000 [IU] | INTRAMUSCULAR | Status: DC | PRN

## 2022-07-09 MED ORDER — HYDRALAZINE HCL 50 MG PO TABS
50.0000 mg | ORAL_TABLET | Freq: Three times a day (TID) | ORAL | Status: DC
  Administered 2022-07-09 – 2022-07-13 (×12): 50 mg via ORAL
  Filled 2022-07-09 (×12): qty 1

## 2022-07-09 MED ORDER — PROPOFOL 10 MG/ML IV BOLUS
INTRAVENOUS | Status: AC
  Filled 2022-07-09: qty 20

## 2022-07-09 MED ORDER — LACTATED RINGERS IV SOLN: INTRAVENOUS | Status: DC

## 2022-07-09 MED ORDER — FENTANYL CITRATE (PF) 250 MCG/5ML IJ SOLN
INTRAMUSCULAR | Status: AC
  Filled 2022-07-09: qty 5

## 2022-07-09 MED ORDER — HEPARIN (PORCINE) 25000 UT/250ML-% IV SOLN
1800.0000 [IU]/h | INTRAVENOUS | Status: DC
  Administered 2022-07-09: 1800 [IU]/h via INTRAVENOUS
  Filled 2022-07-09: qty 250

## 2022-07-09 MED ORDER — LIDOCAINE 2% (20 MG/ML) 5 ML SYRINGE
INTRAMUSCULAR | Status: DC | PRN
  Administered 2022-07-09: 40 mg via INTRAVENOUS

## 2022-07-09 MED ORDER — PROPOFOL 500 MG/50ML IV EMUL
INTRAVENOUS | Status: DC | PRN
  Administered 2022-07-09: 75 ug/kg/min via INTRAVENOUS

## 2022-07-09 MED ORDER — DEXAMETHASONE SODIUM PHOSPHATE 10 MG/ML IJ SOLN
INTRAMUSCULAR | Status: DC | PRN
  Administered 2022-07-09: 10 mg via INTRAVENOUS

## 2022-07-09 MED ORDER — CHLORHEXIDINE GLUCONATE 0.12 % MT SOLN: 15.0000 mL | Freq: Once | OROMUCOSAL | Status: AC

## 2022-07-09 SURGICAL SUPPLY — 43 items
ALLOGRAFT SALERA POWDER 80 (Graft) IMPLANT
APL PRP STRL LF DISP 70% ISPRP (MISCELLANEOUS)
BAG COUNTER SPONGE SURGICOUNT (BAG) ×1 IMPLANT
BAG SPNG CNTER NS LX DISP (BAG) ×1
BANDAGE ACE 4X5 VEL STRL LF (GAUZE/BANDAGES/DRESSINGS) IMPLANT
BLADE LONG MED 31X9 (MISCELLANEOUS) IMPLANT
BLADE SURG 15 STRL LF DISP TIS (BLADE) IMPLANT
BLADE SURG 15 STRL SS (BLADE)
BNDG ELASTIC 4X5.8 VLCR STR LF (GAUZE/BANDAGES/DRESSINGS) IMPLANT
BNDG GAUZE DERMACEA FLUFF 4 (GAUZE/BANDAGES/DRESSINGS) ×1 IMPLANT
BNDG GZE DERMACEA 4 6PLY (GAUZE/BANDAGES/DRESSINGS) ×1
CHLORAPREP W/TINT 26 (MISCELLANEOUS) IMPLANT
COVER SURGICAL LIGHT HANDLE (MISCELLANEOUS) ×1 IMPLANT
ELECT REM PT RETURN 9FT ADLT (ELECTROSURGICAL) ×1
ELECTRODE REM PT RTRN 9FT ADLT (ELECTROSURGICAL) IMPLANT
GAUZE PAD ABD 8X10 STRL (GAUZE/BANDAGES/DRESSINGS) ×1 IMPLANT
GAUZE SPONGE 4X4 12PLY STRL (GAUZE/BANDAGES/DRESSINGS) ×1 IMPLANT
GAUZE XEROFORM 1X8 LF (GAUZE/BANDAGES/DRESSINGS) ×1 IMPLANT
GAUZE XEROFORM 5X9 LF (GAUZE/BANDAGES/DRESSINGS) IMPLANT
GLOVE BIO SURGEON STRL SZ8 (GLOVE) ×1 IMPLANT
GLOVE BIOGEL PI IND STRL 8 (GLOVE) ×1 IMPLANT
GOWN STRL REUS W/ TWL LRG LVL3 (GOWN DISPOSABLE) ×2 IMPLANT
GOWN STRL REUS W/TWL LRG LVL3 (GOWN DISPOSABLE) ×2
KIT BASIN OR (CUSTOM PROCEDURE TRAY) ×1 IMPLANT
KIT TURNOVER KIT B (KITS) ×1 IMPLANT
NDL HYPO 25GX1X1/2 BEV (NEEDLE) IMPLANT
NDL PRECISIONGLIDE 27X1.5 (NEEDLE) ×1 IMPLANT
NEEDLE HYPO 25GX1X1/2 BEV (NEEDLE) ×2 IMPLANT
NEEDLE PRECISIONGLIDE 27X1.5 (NEEDLE) IMPLANT
NS IRRIG 1000ML POUR BTL (IV SOLUTION) ×1 IMPLANT
PACK ORTHO EXTREMITY (CUSTOM PROCEDURE TRAY) ×1 IMPLANT
PAD ARMBOARD 7.5X6 YLW CONV (MISCELLANEOUS) ×2 IMPLANT
PAD CAST 4YDX4 CTTN HI CHSV (CAST SUPPLIES) ×1 IMPLANT
PADDING CAST COTTON 4X4 STRL (CAST SUPPLIES)
SOL PREP POV-IOD 4OZ 10% (MISCELLANEOUS) ×1 IMPLANT
STAPLER VISISTAT 35W (STAPLE) IMPLANT
SUT PROLENE 3 0 PS 2 (SUTURE) IMPLANT
SUT PROLENE 4 0 PS 2 18 (SUTURE) IMPLANT
SYR CONTROL 10ML LL (SYRINGE) ×1 IMPLANT
TOWEL GREEN STERILE (TOWEL DISPOSABLE) ×1 IMPLANT
TOWEL GREEN STERILE FF (TOWEL DISPOSABLE) ×1 IMPLANT
TUBE CONNECTING 12X1/4 (SUCTIONS) ×1 IMPLANT
YANKAUER SUCT BULB TIP NO VENT (SUCTIONS) IMPLANT

## 2022-07-09 NOTE — Progress Notes (Signed)
Rounding Note    Patient Name: Steve [name]. Date of Encounter: 07/09/2022  [facility] Cardiologist: Peter Martinique, MD   Subjective   Patient was seen and examined at his bedside.   Inpatient Medications    Scheduled Meds:  acetaminophen  650 mg Oral Q6H   aspirin EC  81 mg Oral Daily   atorvastatin  40 mg Oral Daily   Chlorhexidine Gluconate Cloth  6 each Topical Daily   docusate sodium  100 mg Oral BID   doxazosin  4 mg Oral Daily   ezetimibe  10 mg Oral Daily   feeding supplement  237 mL Oral TID BM   gabapentin  100 mg Oral QHS   hydrALAZINE  25 mg Oral Q8H   insulin aspart  0-15 Units Subcutaneous TID WC   insulin aspart  0-5 Units Subcutaneous QHS   insulin detemir  15 Units Subcutaneous Daily   isosorbide mononitrate  30 mg Oral Daily   metoprolol succinate  100 mg Oral Q supper   multivitamin with minerals  1 tablet Oral Daily   sodium chloride flush  3 mL Intravenous Q12H   sodium chloride flush  3 mL Intravenous Q12H   Continuous Infusions:  sodium chloride 250 mL (07/07/22 2256)    ceFAZolin (ANCEF) IV 2 g (07/09/22 0620)   heparin 1,750 Units/hr (07/09/22 0218)   PRN Meds: sodium chloride, acetaminophen **OR** acetaminophen, albuterol, bisacodyl, cyclobenzaprine, hydrALAZINE, labetalol, lidocaine, LORazepam **OR** LORazepam, morphine injection, ondansetron **OR** ondansetron (ZOFRAN) IV, oxyCODONE, polyethylene glycol, sodium chloride flush   Vital Signs    Vitals:   07/09/22 0033 07/09/22 0406 07/09/22 0500 07/09/22 0801  BP: (!) 166/65 (!) 159/62  (!) 164/63  Pulse: 64 67  72  Resp: 20 16  18   Temp: 98.5 F (36.9 C) 98.5 F (36.9 C)  98.1 F (36.7 C)  TempSrc: Oral Oral  Oral  SpO2: 99% 98%  98%  Weight:   86 kg   Height:        Intake/Output Summary (Last 24 hours) at 07/09/2022 1009 Last data filed at 07/09/2022 0700 Gross per 24 hour  Intake 618.22 ml  Output 5050 ml  Net -4431.78 ml      07/09/2022    5:00 AM  07/08/2022    4:13 AM 07/07/2022   11:01 AM  Last 3 Weights  Weight (lbs) 189 lb 9.5 oz 189 lb 6 oz 187 lb 13.3 oz  Weight (kg) 86 kg 85.9 kg 85.2 kg      Telemetry     - Personally Reviewed  ECG    None today  - Personally Reviewed  Physical Exam   GEN: No acute distress.   Neck: No JVD Cardiac: RRR, no murmurs, rubs, or gallops.  Respiratory: Clear to auscultation bilaterally. GI: Soft, nontender, non-distended  MS: No edema; No deformity. Neuro:  Nonfocal  Psych: Normal affect   Labs    High Sensitivity Troponin:  No results for input(s): "TROPONINIHS" in the last 720 hours.   Chemistry Recent Labs  Lab 07/05/22 0250 07/06/22 0354 07/07/22 0413 07/07/22 1244 07/07/22 1502 07/08/22 0730 07/09/22 0337  NA 138 139 139   < > 141 134* 141  K 3.0* 3.7 3.5   < > 4.4 4.3 4.1  CL 111 113* 112*  --   --  108 110  CO2 19* 21* 21*  --   --  19* 22  GLUCOSE 118* 121* 116*  --   --  247* 155*  BUN 12 10 9   --   --  13 18  CREATININE 1.49* 1.45* 1.35*  --   --  1.53* 1.48*  CALCIUM 7.9* 8.1* 8.0*  --   --  7.6* 8.4*  ALBUMIN 2.5* 2.5* 2.4*  --   --   --   --   GFRNONAA 46* 48* 52*  --   --  45* 47*  ANIONGAP 8 5 6   --   --  7 9   < > = values in this interval not displayed.    Lipids  Recent Labs  Lab 07/03/22 0607  CHOL 98  TRIG 170*  HDL 17*  LDLCALC 47  CHOLHDL 5.8    Hematology Recent Labs  Lab 07/07/22 1801 07/08/22 0730 07/09/22 0337  WBC 8.4 10.3 9.0  RBC 3.18* 3.07* 2.86*  HGB 9.7* 9.6* 9.0*  HCT 29.4* 28.0* 26.7*  MCV 92.5 91.2 93.4  MCH 30.5 31.3 31.5  MCHC 33.0 34.3 33.7  RDW 15.8* 16.1* 16.3*  PLT 94* 100* 94*   Thyroid No results for input(s): "TSH", "FREET4" in the last 168 hours.  BNPNo results for input(s): "BNP", "PROBNP" in the last 168 hours.  DDimer No results for input(s): "DDIMER" in the last 168 hours.   Radiology    No results found.  Cardiac Studies   Echo: 11/2018   IMPRESSIONS     1. The left ventricle has  moderately reduced systolic function, with an  ejection fraction of 35-40%. The cavity size was mildly dilated. Left  ventricular diastolic Doppler parameters are consistent with impaired  relaxation.   2. Moderate hypokinesis of the basal left ventricular inferoseptal wall.   3. The right ventricle has normal systolic function. The cavity was  normal. There is no increase in right ventricular wall thickness. Right  ventricular systolic pressure could not be assessed.   4. The mitral valve is normal in structure. Mild calcification of the  mitral valve leaflet. There is mild mitral annular calcification present.  Mitral valve regurgitation is mild to moderate by color flow Doppler.   5. The tricuspid valve is normal in structure.   6. The aortic valve is tricuspid.   7. The aortic root is normal in size and structure.   8. There is mild dilatation of the ascending aorta.   FINDINGS   Left Ventricle: The left ventricle has moderately reduced systolic  function, with an ejection fraction of 35-40%. The cavity size was mildly  dilated. There is borderline increase in left ventricular wall thickness.  Left ventricular diastolic Doppler  parameters are consistent with impaired relaxation Moderate hypokinesis of  the left ventricular inferoseptal wall.  Right Ventricle: The right ventricle has normal systolic function. The  cavity was normal. There is no increase in right ventricular wall  thickness. Right ventricular systolic pressure could not be assessed.  Left Atrium: left atrial size was normal in size  Right Atrium: right atrial size was normal in size.  Interatrial Septum: No atrial level shunt detected by color flow Doppler.  Pericardium: There is no evidence of pericardial effusion.  Mitral Valve: The mitral valve is normal in structure. Mild calcification  of the mitral valve leaflet. There is mild mitral annular calcification  present. Mitral valve regurgitation is mild to  moderate by color flow  Doppler.  Tricuspid Valve: The tricuspid valve is normal in structure. Tricuspid  valve regurgitation is trivial by color flow Doppler.  Aortic Valve: The aortic valve is tricuspid Aortic  valve regurgitation was  not visualized by color flow Doppler.  Pulmonic Valve: The pulmonic valve was grossly normal. Pulmonic valve  regurgitation is trivial by color flow Doppler.  Aorta: The aortic root is normal in size and structure. There is mild  dilatation of the ascending aorta.  Venous: The inferior vena cava was not well visualized.   Patient Profile     81 y.o. 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  Hypertensive- increase his hydralazine to 50 mg every 8 hrs. Continue his metoprolol. No angina symptoms - continue same CAD med regimen. Appears clinically Euvolemic.  PAF- in sinus rhythm - continue rate control and anticoagulation.   Rock will sign off.   Medication Recommendations: Hydralazine 50 every 8 hours, metoprolol succinate 100 mg daily, please transition to Eliquis 2.5 mg twice daily  Other recommendations (labs, testing, etc): None Follow up as an outpatient:  routine follow up        Signed, Lacheryl Niesen, DO  07/09/2022, 10:09 AM

## 2022-07-09 NOTE — Progress Notes (Signed)
Progress Note Patient: Steve [name]. BOF:[id number] DOB: 30-Dec-1940 DOA: 06/25/2022  DOS: the patient was seen and examined on 07/09/2022  Brief hospital course: Elishah Ashmore. is a 81 y.o. male with a history of CAD s/p CABG, hypertension, hyperlipidemia, diabetes mellitus type 2, PAD, atrial fibrillation. Patient presented secondary to tremors and was found to have fevers with concern for possible infection. SIRS criteria met without source identified. Patient started empirically on Vancomycin, Cefepime and Flagyl. Blood and urine cultures obtained. MRI significant for osteomyelitis. Podiatry and vascular surgery consulted. S/P angiogram on 9/8.  Recommendation is for femoropopliteal bypass currently scheduled on 9/13.  Will remain on IV heparin until then.  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  Associated with cellulitis. Osteomyelitis of the fifth metatarsal head/neck in addition to early osteomyelitis of the fifth digit proximal phalanx on MRI from 9/3. Blood and urine cultures are no growth.  Empiric Vancomycin, Cefepime and Flagyl initiated on admission and patient has been deescalated to Cefepime.   As of 9/6 now on cefazolin. -Continue cefazolin  -Status post left partial fifth ray amputation -Likely transition to oral antibiotics in a.m.   AKI on CKD stage IIIa Patient's baseline creatinine appears to be around 1.2. Creatinine of 2.47 on admission. Treated with IV fluids. Mild worsening after angiography.  Currently creatinine down to 1.3 Monitor.   Hyponatremia Sodium of 126 on admission. Likely related to dehydration.  Resolved    Acute urinary retention Unsure of etiology.  Patient appears to be asymptomatic.  Urinalysis is not suggestive of infection. Patient with continued urinary retention -Foley catheter inserted on 9/3.  Attempt voiding trial on 9/16 after  foot surgery. -Outpatient urology follow-up   Constipation Resolved.  Continue current bowel regimen.   Diabetes mellitus type 2 uncontrolled with hyperglycemia with nonhealing wounds. Patient is on Januvia, Levemir and metformin as an outpatient. Most recent hemoglobin A1C is 7.9%. -Continue Levemir and SSI Blood sugar stable   CAD Hyperlipidemia PAD No chest pain. Stable. Continue Zetia, on Repatha outpatient. ABI significantly abnormal.  Angiography shows evidence of significant stenotic disease. S/p left common femoral-peroneal artery bypass   Primary hypertension -Continue Imdur and Toprol XL   Paroxysmal atrial fibrillation -Patient was on Eliquis and Toprol XL Anticoagulation currently on hold due to surgery today -Likely resume Eliquis tomorrow   Thrombocytopenia No current bleeding. stable.   Acute encephalopathy. Mostly delirium Etiology not clear  most likely combination of prolonged hospitalization and medication side effect.  Unable to differentiate further whether metabolic or toxic. Now resolved. Monitor.   Prolonged QTc. QTc is 501. Monitor for now.  Avoid QT prolonging medications.  Right ankle pain. Patient reports that his right ankle is also hurting significantly for last few days. X-ray ankle negative for any acute abnormality.  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  Denies any significant complaints at this time.  Physical Exam: Vitals:   07/09/22 1711 07/09/22 1715 07/09/22 1730 07/09/22 1745  BP: (!) 151/70 (!) 160/93 (!) 158/56 (!) 162/55  Pulse: 88 89 78 77  Resp: 17 19 17 17   Temp: 97.7 F (36.5 C)   97.7  F (36.5 C)  TempSrc:      SpO2:  95% 94% 96% 94%  Weight:      Height:       General: sitting in up bed, no distress, skin appears pale Cardiovascular: S1 and S2 Present, aortic systolic Murmur, Respiratory: good respiratory effort, Bilateral Air entry present and CTA, no Crackles, no wheezes Abdomen: Bowel Sound present, Non tender Extremities: Left foot is wrapped in dressing Neurology: alert and oriented to Self, Place and time.   Data Reviewed: I have Reviewed nursing notes, Vitals, and Lab results since pt's last encounter. Pertinent lab results BMP I have ordered test including CBC and BMP    Family Communication: discussed with son at the bedside 9/15  Disposition: Status is: Inpatient Remains inpatient appropriate because: Scheduled for procedure on Friday.  Monitor postop course.  Author: Kathie Dike, MD 07/09/2022 7:12 PM  Please look on [email or web address] to find out who is on call.

## 2022-07-09 NOTE — Progress Notes (Signed)
ANTICOAGULATION CONSULT NOTE- Follow up  Pharmacy Consult for Heparin (Eliquis on hold) Indication: atrial fibrillation  Allergies  Allergen Reactions   Januvia [Sitagliptin] Other (See Comments)    Soreness to stomach area. Intolerance    Statins Other (See Comments)    Myalgias, muscle weakness, swelling, pain    Patient Measurements: Height: 5\' 9"  (175.3 cm) Weight: 86 kg (189 lb 9.5 oz) IBW/kg (Calculated) : 70.7 Heparin Dosing Weight: 86 kg  Vital Signs: Temp: 97.7 F (36.5 C) (09/15 1745) Temp Source: Oral (09/15 1456) BP: 162/55 (09/15 1745) Pulse Rate: 77 (09/15 1745)  Labs: Recent Labs    07/07/22 0413 07/07/22 1244 07/07/22 1801 07/08/22 0730 07/08/22 1922 07/09/22 0337  HGB 7.9*   < > 9.7* 9.6*  --  9.0*  HCT 23.7*   < > 29.4* 28.0*  --  26.7*  PLT 101*  --  94* 100*  --  94*  HEPARINUNFRC 0.37  --   --  <0.10* 0.30 0.33  CREATININE 1.35*  --   --  1.53*  --  1.48*   < > = values in this interval not displayed.     Estimated Creatinine Clearance: 41.1 mL/min (A) (by C-G formula based on SCr of 1.48 mg/dL (H)).   Assessment: 81 yo male admitted for left foot wound infection on apixaban PTA for afib. Apixaban now held for pending arteriography +/- revascularization on Friday. Last dose apixaban 9/5. Pharmacy consulted to manage IV heparin while apixaban on hold.   S/p amputation - > heparin to resume at 10 pm tonight   Goal of Therapy:  Heparin level 0.3-0.7 units/ml aPTT 66-102 seconds Monitor platelets by anticoagulation protocol: Yes   Plan:  Heparin at 1800 units / hr starting at 10 pm Follow up AM heparin level, CBC.  Thank you. Anette Guarneri, PharmD  07/09/2022 6:06 PM   [facility] pharmacy phone numbers are listed on [email or web address]

## 2022-07-09 NOTE — Brief Op Note (Signed)
07/09/2022  5:23 PM  PATIENT:  Steve [name]  81 y.o. male  PRE-OPERATIVE DIAGNOSIS:  OSTEOMYELITIS OF LEFT FOOT  POST-OPERATIVE DIAGNOSIS:  OSTEOMYELITIS OF LEFT FOOT  PROCEDURE:  Procedure(s): PARTIAL LEFT FIFTH RAY AMPUTATION (Left)  SURGEON:  Surgeon(s) and Role:    Edrick Kins, DPM - Primary  PHYSICIAN ASSISTANT:   ASSISTANTS: none   ANESTHESIA:   local, MAC, and 47m 2% lidocaine w/ epi  EBL:  50 mL   BLOOD ADMINISTERED:none  DRAINS: none   LOCAL MEDICATIONS USED:  LIDOCAINE 2% w/ epi and Amount: 20 ml  SPECIMEN:  No Specimen  DISPOSITION OF SPECIMEN:  N/A  COUNTS:  YES  TOURNIQUET:  * No tourniquets in log *  DICTATION: .Dragon Dictation  PLAN OF CARE: Admit to inpatient   PATIENT DISPOSITION:  PACU - hemodynamically stable.   Delay start of Pharmacological VTE agent (>24hrs) due to surgical blood loss or risk of bleeding: no

## 2022-07-09 NOTE — Progress Notes (Signed)
Occupational Therapy Treatment Patient Details Name: Steve [name]. MRN: [id number] DOB: [birth year] Today's Date: 07/09/2022   History of present illness Pt is an 81 y.o. male admitted from [place] on 06/25/22 with tremors, feeling poorly. Workup for AKI, hyponatremia, sepsis, L foot nonhealing wound. S/p aortogram, LLE angiogram 9/8. 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 PMH includes CAD s/p CABG, HTN, HLD, DM, PAD, afib.   OT comments  Patient received in supine and had no complaints of pain and willing to participate with OT. Once on EOB patient stated increased pain in LLE. Patient ambulated to bathroom for toilet transfer and stood at sink for hand hygiene. Patient stated with increased pain he did not want to mobilize more and returned to supine.  Acute OT to continue to follow.    Recommendations for follow up therapy are one component of a multi-disciplinary discharge planning process, led by the attending physician.  Recommendations may be updated based on patient status, additional functional criteria and insurance authorization.    Follow Up Recommendations  Home health OT    Assistance Recommended at Discharge Set up Supervision/Assistance  Patient can return home with the following  A little help with walking and/or transfers;A little help with bathing/dressing/bathroom;Direct supervision/assist for medications management;Direct supervision/assist for financial management;Help with stairs or ramp for entrance   Equipment Recommendations  None recommended by OT    Recommendations for Other Services      Precautions / Restrictions Precautions Precautions: Fall Restrictions Weight Bearing Restrictions: No       Mobility Bed Mobility Overal bed mobility: Modified Independent             General bed mobility comments: no assistance for supine to sit and sit to supine    Transfers Overall transfer level: Needs assistance Equipment used:  Rolling walker (2 wheels) Transfers: Sit to/from Stand Sit to Stand: Supervision           General transfer comment: performed toilet transfer and back to bed     Balance Overall balance assessment: Needs assistance Sitting-balance support: No upper extremity supported, Feet supported Sitting balance-Leahy Scale: Good     Standing balance support: No upper extremity supported, During functional activity Standing balance-Leahy Scale: Fair Standing balance comment: can static stand without UE support for ADL grooming tasks at sink                           ADL either performed or assessed with clinical judgement   ADL Overall ADL's : Needs assistance/impaired     Grooming: Wash/dry hands;Supervision/safety;Standing Grooming Details (indicate cue type and reason): standing at sink                 Toilet Transfer: Supervision/safety;Ambulation;Regular Toilet;Rolling walker (2 wheels);Grab bars Toilet Transfer Details (indicate cue type and reason): ambulated to toilet           General ADL Comments: patient with no complaints of pain when supine and increased pain when up. Performed toilet transfer and hand hygiene and returned to supine    Extremity/Trunk Assessment              Vision       Perception     Praxis      Cognition Arousal/Alertness: Awake/alert Behavior During Therapy: WFL for tasks assessed/performed Overall Cognitive Status: Within Functional Limits for tasks assessed  General Comments: motivated with therapy        Exercises      Shoulder Instructions       General Comments      Pertinent Vitals/ Pain       Pain Assessment Pain Assessment: Faces Faces Pain Scale: Hurts even more Pain Location: LLE Pain Descriptors / Indicators: Discomfort, Sore, Moaning Pain Intervention(s): Limited activity within patient's tolerance, Monitored during session,  Repositioned  Home Living                                          Prior Functioning/Environment              Frequency  Min 2X/week        Progress Toward Goals  OT Goals(current goals can now be found in the care plan section)  Progress towards OT goals: Progressing toward goals  Acute Rehab OT Goals Patient Stated Goal: get better OT Goal Formulation: With patient Time For Goal Achievement: 07/10/22 Potential to Achieve Goals: Good ADL Goals Pt Will Perform Grooming: with modified independence;standing Pt Will Perform Lower Body Bathing: with modified independence;sitting/lateral leans;sit to/from stand Pt Will Perform Lower Body Dressing: with modified independence;sitting/lateral leans;sit to/from stand Pt Will Transfer to Toilet: with modified independence;ambulating Pt Will Perform Toileting - Clothing Manipulation and hygiene: with modified independence;sitting/lateral leans;sit to/from stand  Plan Discharge plan remains appropriate    Co-evaluation                 AM-PAC OT "6 Clicks" Daily Activity     Outcome Measure   Help from another person eating meals?: None Help from another person taking care of personal grooming?: A Little Help from another person toileting, which includes using toliet, bedpan, or urinal?: A Little Help from another person bathing (including washing, rinsing, drying)?: A Little Help from another person to put on and taking off regular upper body clothing?: None Help from another person to put on and taking off regular lower body clothing?: A Little 6 Click Score: 20    End of Session Equipment Utilized During Treatment: Rolling walker (2 wheels)  OT Visit Diagnosis: Unsteadiness on feet (R26.81);Other abnormalities of gait and mobility (R26.89);Muscle weakness (generalized) (M62.81)   Activity Tolerance Patient limited by pain   Patient Left in bed;with call bell/phone within reach   Nurse  Communication Mobility status        Time: 1700-1749 OT Time Calculation (min): 20 min  Charges: OT General Charges $OT Visit: 1 Visit OT Treatments $Self Care/Home Management : 8-22 mins  Lodema Hong, Lucky  Office [place] 07/09/2022, 9:05 AM

## 2022-07-09 NOTE — Op Note (Signed)
   OPERATIVE REPORT Patient name: Steve [name]. MRN: [id number] DOB: 1941-01-08  DOS: 07/09/22  Preop Dx: Osteomyelitis fifth ray left foot Postop Dx: same  Procedure:  1.  Partial fifth ray amputation left foot  Surgeon: Edrick Kins DPM  Anesthesia: Monitored anesthesia care with local block infiltration consisting of 2% lidocaine w/epi  Hemostasis: None  EBL: 50 mL Materials: [employer] Salera Mini Membrane 80mg  Injectables: None Pathology: None  Condition: The patient tolerated the procedure and anesthesia well. No complications noted or reported   Justification for procedure: The patient is a 81 y.o. male now who presents today for surgical correction of osteomyelitis to the fifth ray of the left foot. The patient was told benefits as well as possible side effects of the surgery. The patient consented for surgical correction. The patient consent form was reviewed. All patient questions were answered. No guarantees were expressed or implied. The patient and the surgeon both signed the patient consent form and placed in the patient's chart.   Procedure in Detail: The patient was brought to the operating room, placed in the operating table in the supine position at which time an aseptic scrub and drape were performed about the patient's respective lower extremity after anesthesia was induced as described above. Attention was then directed to the surgical area where procedure number one commenced.  Procedure #1: Partial fifth ray amputation left foot A racquet type incision was planned and made about the fifth MTP of the left foot.  The incision was carried down to level of bone at which time the fifth toe was disarticulated at the level of the MTP using a #15 scalpel.  The toe was removed in toto which expose the underlying distal portion of the fifth metatarsal.  Additional soft tissue was reflected away from the distal half of the fifth metatarsal bone.  At this time an  osteotomy was performed in the diaphysis of the fifth metatarsal and the distal portion of the fifth metatarsal was removed as well.  Copious irrigation was utilized and any necrotic nonviable tissue within the amputation site was debrided away using #15 scalpel.  Additional irrigation performed which demonstrated healthy margins and viable tissue. Salera Mini Membrane 80mg  was placed within the amputation site prior to primary closure to assist in wound healing.  3-0 Prolene suture was utilized to reapproximate superficial skin edges at this time.  Dry sterile compressive dressings were then applied to all previously mentioned incision sites about the patient's lower extremity. The patient was then transferred from the operating room to the recovery room having tolerated the procedure and anesthesia well. All vital signs are stable. After a brief stay in the recovery room the patient was readmitted to inpatient room with postoperative orders placed.   SURGICAL IMPRESSION: Clean margins.  Surgical cure of the underlying bone infection.  Anticipate discharge on p.o. antibiotics x7 days  Edrick Kins, DPM [facility]  Dr. Edrick Kins, DPM    [address], [place] [address]                Office [phone]  Fax [phone]

## 2022-07-09 NOTE — Transfer of Care (Signed)
Immediate Anesthesia Transfer of Care Note  Patient: Sherry Rogus.  Procedure(s) Performed: PARTIAL LEFT FIFTH RAY AMPUTATION (Left: Foot)  Patient Location: PACU  Anesthesia Type:MAC combined with regional for post-op pain  Level of Consciousness: drowsy and patient cooperative  Airway & Oxygen Therapy: Patient Spontanous Breathing  Post-op Assessment: Report given to RN and Post -op Vital signs reviewed and stable  Post vital signs: Reviewed and stable  Last Vitals:  Vitals Value Taken Time  BP 160/93 07/09/22 1715  Temp 36.5 C 07/09/22 1711  Pulse 83 07/09/22 1716  Resp 17 07/09/22 1716  SpO2 96 % 07/09/22 1716  Vitals shown include unvalidated device data.  Last Pain:  Vitals:   07/09/22 1510  TempSrc:   PainSc: 4       Patients Stated Pain Goal: 0 (39/53/20 2334)  Complications: No notable events documented.

## 2022-07-09 NOTE — Progress Notes (Signed)
ANTICOAGULATION CONSULT NOTE- Follow up  Pharmacy Consult for Heparin (Eliquis on hold) Indication: atrial fibrillation  Allergies  Allergen Reactions   Januvia [Sitagliptin] Other (See Comments)    Soreness to stomach area. Intolerance    Statins Other (See Comments)    Myalgias, muscle weakness, swelling, pain    Patient Measurements: Height: 5\' 9"  (175.3 cm) Weight: 86 kg (189 lb 9.5 oz) IBW/kg (Calculated) : 70.7 Heparin Dosing Weight: 86 kg  Vital Signs: Temp: 98.1 F (36.7 C) (09/15 0801) Temp Source: Oral (09/15 0801) BP: 164/63 (09/15 0801) Pulse Rate: 72 (09/15 0801)  Labs: Recent Labs    07/07/22 0413 07/07/22 1244 07/07/22 1801 07/08/22 0730 07/08/22 1922 07/09/22 0337  HGB 7.9*   < > 9.7* 9.6*  --  9.0*  HCT 23.7*   < > 29.4* 28.0*  --  26.7*  PLT 101*  --  94* 100*  --  94*  HEPARINUNFRC 0.37  --   --  <0.10* 0.30 0.33  CREATININE 1.35*  --   --  1.53*  --  1.48*   < > = values in this interval not displayed.     Estimated Creatinine Clearance: 41.1 mL/min (A) (by C-G formula based on SCr of 1.48 mg/dL (H)).   Assessment: 81 yo male admitted for left foot wound infection on apixaban PTA for afib. Apixaban now held for pending arteriography +/- revascularization on Friday. Last dose apixaban 9/5. Pharmacy consulted to manage IV heparin while apixaban on hold.   Heparin IV stopped on 9/13 AM prior to L fem to peroneal bypass.     9/14 POD #1 , VVS noted Left medial leg incision dressing with some bloody oozing, and okay to transition to Eliquis, Cardiologist agrees with anticoatulation.   Dr. Roderic Palau, [facility] ordered to restart IV heparin infusion for now 2/2 may need procedure on foot.   07/09/22  POD# update:  HL=0.33 is therapeutic on heparin drip 1750 units/hr Hgb 9.0 stable, s/p PRBCs  on 9/13, pltc 94 stable.  No overt bleeding or complications noted.  Plan for surgery today 9/15 for left 5th ray amputation.   No orders from podiatry to hold IV  heparin yet.     Goal of Therapy:  Heparin level 0.3-0.7 units/ml aPTT 66-102 seconds Monitor platelets by anticoagulation protocol: Yes   Plan:  Continue IV Heparin infusion at 1750 units/hr.   I contacted Dr.Brent Amalia Hailey, DPM office 10:35 AM to find out if IV heparin should be held preop.  Awaiting call back.   Daily HL and CBC while on IV heparin. Follow up post op for restart of  heparin or Eliquis.   Thank you for allowing [place] to participate in this patients care.  Nicole Cella, RPh Clinical Pharmacist [phone]  07/09/2022 10:07 AM   [facility] pharmacy phone numbers are listed on [email or web address]

## 2022-07-09 NOTE — Anesthesia Procedure Notes (Signed)
Procedure Name: MAC Date/Time: 07/09/2022 4:23 PM  Performed by: Dorann Lodge, CRNAPre-anesthesia Checklist: Patient identified, Emergency Drugs available, Suction available and Patient being monitored Patient Re-evaluated:Patient Re-evaluated prior to induction Oxygen Delivery Method: Simple face mask Dental Injury: Teeth and Oropharynx as per pre-operative assessment

## 2022-07-09 NOTE — Progress Notes (Signed)
Mobility Specialist - Progress Note   07/09/22 1530  Mobility  Activity Contraindicated/medical hold   Pt was off unit. Will follow up if time permits.   Larey Seat

## 2022-07-09 NOTE — Progress Notes (Addendum)
  Progress Note    07/09/2022 6:38 AM 2 Days Post-Op  Subjective:  a little soreness around the groin  afebrile  Vitals:   07/09/22 0033 07/09/22 0406  BP: (!) 166/65 (!) 159/62  Pulse: 64 67  Resp: 20 16  Temp: 98.5 F (36.9 C) 98.5 F (36.9 C)  SpO2: 99% 98%    Physical Exam: Cardiac:  regular Lungs:  non labored Incisions:  left groin clean and dry with steri strips in place.  Left lower incision bandage removed and clean with staples in tact. Extremities:    Brisk left peroneal doppler signal   CBC    Component Value Date/Time   WBC 9.0 07/09/2022 0337   RBC 2.86 (L) 07/09/2022 0337   HGB 9.0 (L) 07/09/2022 0337   HGB 9.9 (L) 07/24/2018 1423   HCT 26.7 (L) 07/09/2022 0337   PLT 94 (L) 07/09/2022 0337   PLT 128 (L) 07/24/2018 1423   MCV 93.4 07/09/2022 0337   MCH 31.5 07/09/2022 0337   MCHC 33.7 07/09/2022 0337   RDW 16.3 (H) 07/09/2022 0337   LYMPHSABS 0.3 (L) 06/25/2022 1550   MONOABS 0.2 06/25/2022 1550   EOSABS 0.1 06/25/2022 1550   BASOSABS 0.0 06/25/2022 1550    BMET    Component Value Date/Time   NA 141 07/09/2022 0337   K 4.1 07/09/2022 0337   CL 110 07/09/2022 0337   CO2 22 07/09/2022 0337   GLUCOSE 155 (H) 07/09/2022 0337   BUN 18 07/09/2022 0337   CREATININE 1.48 (H) 07/09/2022 0337   CALCIUM 8.4 (L) 07/09/2022 0337   GFRNONAA 47 (L) 07/09/2022 0337   GFRAA 46 (L) 05/19/2020 1311    INR    Component Value Date/Time   INR 1.06 12/17/2018 1157     Intake/Output Summary (Last 24 hours) at 07/09/2022 1884 Last data filed at 07/09/2022 0408 Gross per 24 hour  Intake 618.22 ml  Output 4450 ml  Net -3831.78 ml     Assessment/Plan:  81 y.o. male is s/p:  left common femoral to peroneal bypass with PTFE   2 Days Post-Op  -pt doing well with brisk doppler flow left peroneal -for left 5th ray amputation today with podiatry -thrombocytopenia stable at 94k -PT/OT recommending HHPT/OT -discussed groin wound care with pt-please keep  dry gauze in left groin to wick moisture to help prevent wound infection (order placed) -DVT prophylaxis:  eliquis currently on hold for surgery   Leontine Locket, PA-C [facility] [phone] 07/09/2022 6:38 AM  VASCULAR STAFF ADDENDUM: I have independently interviewed and examined the patient. I agree with the above.  Mobilize as able per podiatry recommendations after toe amputation. Good doppler flow at peroneal and pedal arteries.  Once stable, recommend ASA 81mg  PO QD. Eliquis 5mg  PO BID. High intensity statin therapy.  [name], MD [facility] Phone Number: [phone] 07/09/2022 3:09 PM

## 2022-07-09 NOTE — Interval H&P Note (Signed)
History and Physical Interval Note:  07/09/2022 2:56 PM  Steve [name].  has presented today for surgery, with the diagnosis of OSTEOMYELITIS OF LEFT FOOT.  The various methods of treatment have been discussed with the patient and family. After consideration of risks, benefits and other options for treatment, the patient has consented to  Procedure(s): PARTIAL FIFTH RAY AMPUTATION LEFT (Left) as a surgical intervention.  The patient's history has been reviewed, patient examined, no change in status, stable for surgery.  I have reviewed the patient's chart and labs.  Questions were answered to the patient's satisfaction.     Edrick Kins

## 2022-07-09 NOTE — Anesthesia Preprocedure Evaluation (Addendum)
Anesthesia Evaluation  Patient identified by MRN, date of birth, ID band Patient awake    Reviewed: Allergy & Precautions, NPO status , Patient's Chart, lab work & pertinent test results, reviewed documented beta blocker date and time   Airway Mallampati: II  TM Distance: >3 FB Neck ROM: Full    Dental  (+) Dental Advisory Given, Edentulous Lower, Edentulous Upper   Pulmonary former smoker,    Pulmonary exam normal breath sounds clear to auscultation       Cardiovascular hypertension, Pt. on home beta blockers and Pt. on medications + angina + CAD, + CABG (2013), + Peripheral Vascular Disease and +CHF  Normal cardiovascular exam+ dysrhythmias (RBBB) Atrial Fibrillation  Rhythm:Regular Rate:Normal     Neuro/Psych negative neurological ROS     GI/Hepatic negative GI ROS, Neg liver ROS,   Endo/Other  diabetes, Type 2, Insulin Dependent, Oral Hypoglycemic Agents  Renal/GU Renal InsufficiencyRenal disease     Musculoskeletal OSTEOMYELITIS OF LEFT FOOT   Abdominal   Peds  Hematology  (+) Blood dyscrasia (Eliquis; Thrombocytopenia), anemia ,   Anesthesia Other Findings Day of surgery medications reviewed with the patient.  Reproductive/Obstetrics                             Anesthesia Physical Anesthesia Plan  ASA: 4  Anesthesia Plan: MAC   Post-op Pain Management: Tylenol PO (pre-op)*   Induction: Intravenous  PONV Risk Score and Plan: 1 and Dexamethasone, Ondansetron and TIVA  Airway Management Planned: Natural Airway and Simple Face Mask  Additional Equipment:   Intra-op Plan:   Post-operative Plan: Extubation in OR  Informed Consent: I have reviewed the patients History and Physical, chart, labs and discussed the procedure including the risks, benefits and alternatives for the proposed anesthesia with the patient or authorized representative who has indicated his/her understanding  and acceptance.     Dental advisory given  Plan Discussed with: CRNA  Anesthesia Plan Comments: (Local per surgeon)       Anesthesia Quick Evaluation

## 2022-07-10 DIAGNOSIS — N179 Acute kidney failure, unspecified: Secondary | ICD-10-CM | POA: Diagnosis not present

## 2022-07-10 DIAGNOSIS — E871 Hypo-osmolality and hyponatremia: Secondary | ICD-10-CM | POA: Diagnosis not present

## 2022-07-10 DIAGNOSIS — A419 Sepsis, unspecified organism: Secondary | ICD-10-CM | POA: Diagnosis not present

## 2022-07-10 DIAGNOSIS — E860 Dehydration: Secondary | ICD-10-CM | POA: Diagnosis not present

## 2022-07-10 LAB — GLUCOSE, CAPILLARY
Glucose-Capillary: 222 mg/dL — ABNORMAL HIGH (ref 70–99)
Glucose-Capillary: 294 mg/dL — ABNORMAL HIGH (ref 70–99)
Glucose-Capillary: 309 mg/dL — ABNORMAL HIGH (ref 70–99)
Glucose-Capillary: 205 mg/dL — ABNORMAL HIGH (ref 70–99)

## 2022-07-10 LAB — CBC
HCT: 28.1 % — ABNORMAL LOW (ref 39.0–52.0)
Hemoglobin: 9.3 g/dL — ABNORMAL LOW (ref 13.0–17.0)
MCH: 31.3 pg (ref 26.0–34.0)
MCHC: 33.1 g/dL (ref 30.0–36.0)
MCV: 94.6 fL (ref 80.0–100.0)
Platelets: 100 10*3/uL — ABNORMAL LOW (ref 150–400)
RBC: 2.97 MIL/uL — ABNORMAL LOW (ref 4.22–5.81)
RDW: 16.9 % — ABNORMAL HIGH (ref 11.5–15.5)
WBC: 8 10*3/uL (ref 4.0–10.5)
nRBC: 0 % (ref 0.0–0.2)

## 2022-07-10 LAB — BASIC METABOLIC PANEL
Anion gap: 7 (ref 5–15)
BUN: 19 mg/dL (ref 8–23)
CO2: 23 mmol/L (ref 22–32)
Calcium: 8.2 mg/dL — ABNORMAL LOW (ref 8.9–10.3)
Chloride: 107 mmol/L (ref 98–111)
Creatinine, Ser: 1.45 mg/dL — ABNORMAL HIGH (ref 0.61–1.24)
GFR, Estimated: 48 mL/min — ABNORMAL LOW (ref >60)
Glucose, Bld: 243 mg/dL — ABNORMAL HIGH (ref 70–99)
Potassium: 5 mmol/L (ref 3.5–5.1)
Sodium: 137 mmol/L (ref 135–145)

## 2022-07-10 LAB — HEPARIN LEVEL (UNFRACTIONATED)
Heparin Unfractionated: 0.26 IU/mL — ABNORMAL LOW (ref 0.30–0.70)
Heparin Unfractionated: 0.36 IU/mL (ref 0.30–0.70)

## 2022-07-10 MED ORDER — APIXABAN 2.5 MG PO TABS
2.5000 mg | ORAL_TABLET | Freq: Two times a day (BID) | ORAL | Status: DC
  Administered 2022-07-10 – 2022-07-13 (×7): 2.5 mg via ORAL
  Filled 2022-07-10 (×7): qty 1

## 2022-07-10 MED ORDER — CEFADROXIL 500 MG PO CAPS
1000.0000 mg | ORAL_CAPSULE | Freq: Two times a day (BID) | ORAL | Status: DC
  Administered 2022-07-10 – 2022-07-13 (×6): 1000 mg via ORAL
  Filled 2022-07-10 (×7): qty 2

## 2022-07-10 MED ORDER — DOXYCYCLINE HYCLATE 100 MG PO TABS
100.0000 mg | ORAL_TABLET | Freq: Two times a day (BID) | ORAL | Status: DC
  Administered 2022-07-10 – 2022-07-13 (×6): 100 mg via ORAL
  Filled 2022-07-10 (×6): qty 1

## 2022-07-10 NOTE — Progress Notes (Addendum)
2000- Foley removed on day shift, due to void at 1900. So far has taken 2 bathroom trips and pt states he is "peeing a lot" into the toilet (so unable to measure). Bladder scan revealed almost 737mLs retained in bladder. Pt currently does not feel urge to go. He requests to wait two more hours to attempt a more complete void. Will reassess at a later time.  2230- pt attempt to void again w/ urinal, spilled on the floor so again unable to measure. Repeat bladder scan 771mLs. Pt refused Foley replacement at this time. He endorses an urge to go but denied discomfort. He is agreeable to continue checking post-void residuals each time he urinates. Will continue to closely monitor.  0000: pt able to use urinal for next void. Measure 320 mLs. Post-void residual 425mLs. Pt denies discomfort at this time. Will continue to monitor.  0400: pt has voided 75mLs since 12am. He's voiding approx 300-459mLs almost every 2 hours.

## 2022-07-10 NOTE — Progress Notes (Signed)
Subjective: 1 Day Post-Op Procedure(s) (LRB): PARTIAL LEFT FIFTH RAY AMPUTATION (Left) DOS: 07/09/2022 Patient doing well.  Resting comfortably in bed.  Minimal pain associated to the left foot amputation ite  Objective: Skin incision and amputation site is well coapted.  Sutures intact.  There is a small amount of sanguinous drainage which is expected since the patient is on anticoagulant.  No appreciable edema or erythema.  Overall well-healing surgical amputation site  Recent Labs    07/07/22 1502 07/07/22 1801 07/08/22 0730 07/09/22 0337 07/10/22 0223  HGB 7.5* 9.7* 9.6* 9.0* 9.3*   Recent Labs    07/09/22 0337 07/10/22 0223  WBC 9.0 8.0  RBC 2.86* 2.97*  HCT 26.7* 28.1*  PLT 94* 100*   Recent Labs    07/09/22 0337 07/10/22 0223  NA 141 137  K 4.1 5.0  CL 110 107  CO2 22 23  BUN 18 19  CREATININE 1.48* 1.45*  GLUCOSE 155* 243*  CALCIUM 8.4* 8.2*   No results for input(s): "LABPT", "INR" in the last 72 hours.     Assessment/Plan: 1 Day Post-Op Procedure(s) (LRB): PARTIAL LEFT FIFTH RAY AMPUTATION (Left) DOS: 07/09/2022  -Dry sterile dressings applied.  Clean dry and intact -Unreasonable to expect nonweightbearing.  Weightbearing as tolerated for transition purposes only in a surgical shoe -Recommend discharge on oral ABX x7 days per discharging physician -Leave dressings clean and dry until follow-up in office.  Follow-up within 1 week of discharge -From a podiatry/surgical standpoint, patient is stable for discharge. -Podiatry will sign off     Edrick Kins 07/10/2022, 12:32 PM

## 2022-07-10 NOTE — Progress Notes (Addendum)
[facility] [place]  Subjective  - Having a small amount of discomfort in the foot, no other complaints   Objective (!) 153/62 (!) 58 98.3 F (36.8 C) (Oral) 20 95%  Intake/Output Summary (Last 24 hours) at 07/10/2022 0844 Last data filed at 07/10/2022 5176 Gross per 24 hour  Intake 1759.79 ml  Output 2800 ml  Net -1040.21 ml    Groin soft 4 x 4 in crease to prevent breakdown of incision Doppler Peroneal signal Mild small bloody drainage  from medial leg incision dry dressing placed over incision Lungs non labored breathing Left groin soft no hematoma   Assessment/Planning:  POD # 3 Left common femoral - peroneal artery bypass with 70mm PTFE in subcutaneous tunnel POD# 1 fifth ray amputation by DPM  Heel weight bearing with flat bottom shoe PT/OT ordered Stable disposition   Roxy Horseman 07/10/2022 8:44 AM --  Laboratory Lab Results: Recent Labs    07/09/22 0337 07/10/22 0223  WBC 9.0 8.0  HGB 9.0* 9.3*  HCT 26.7* 28.1*  PLT 94* 100*   BMET Recent Labs    07/09/22 0337 07/10/22 0223  NA 141 137  K 4.1 5.0  CL 110 107  CO2 22 23  GLUCOSE 155* 243*  BUN 18 19  CREATININE 1.48* 1.45*  CALCIUM 8.4* 8.2*    COAG Lab Results  Component Value Date   INR 1.06 12/17/2018   INR 1.30 07/24/2018   INR 1.09 04/01/2015   No results found for: "PTT"   I have independently interviewed and examined patient and agree with PA assessment and plan above.   Azelyn Batie C. Donzetta Matters, MD [facility] [place] Office: [phone] Pager: [phone]

## 2022-07-10 NOTE — Progress Notes (Signed)
ANTICOAGULATION CONSULT NOTE- Follow up  Pharmacy Consult for Eliquis  Indication: atrial fibrillation  Allergies  Allergen Reactions   Januvia [Sitagliptin] Other (See Comments)    Soreness to stomach area. Intolerance    Statins Other (See Comments)    Myalgias, muscle weakness, swelling, pain    Patient Measurements: Height: 5\' 9"  (175.3 cm) Weight: 86 kg (189 lb 9.5 oz) IBW/kg (Calculated) : 70.7 Heparin Dosing Weight: 86 kg  Vital Signs: Temp: 98.3 F (36.8 C) (09/16 0823) Temp Source: Oral (09/16 0823) BP: 153/62 (09/16 0823) Pulse Rate: 58 (09/16 0823)  Labs: Recent Labs    07/08/22 0730 07/08/22 1922 07/09/22 0337 07/10/22 0223 07/10/22 0731  HGB 9.6*  --  9.0* 9.3*  --   HCT 28.0*  --  26.7* 28.1*  --   PLT 100*  --  94* 100*  --   HEPARINUNFRC <0.10*   < > 0.33 0.26* 0.36  CREATININE 1.53*  --  1.48* 1.45*  --    < > = values in this interval not displayed.    Estimated Creatinine Clearance: 41.9 mL/min (A) (by C-G formula based on SCr of 1.45 mg/dL (H)).   Assessment: 81 yo male admitted for left foot wound infection on apixaban PTA for afib. Last dose apixaban 9/5. Has been on heparin drip with pending procedures. S/p left partial fifth ray amputation with clean margins obtained per surgery. Ok to transition to PTA Eliquis per TRH. Pharmacy consulted to dose apixaban.    Home dose of apixaban 2.5 mg PO BID. Scr < 1.5, age > 80, wt > 60 kg. Although does not meet 2/3 criteria for reduced afib dosing in afib, will utilize 2.5 mg given borderline status and known PTA dosing regimen.    Goal of Therapy:  Monitor platelets by anticoagulation protocol: Yes   Plan:  Discontinue heparin gtt Start apixaban 2.5 mg PO BID  Resume apixaban immediately after discontinuation of heparin   Trend H&H   Steve [name], PharmD PGY1 Pharmacy Resident   07/10/2022 9:00 AM

## 2022-07-10 NOTE — Progress Notes (Signed)
Progress Note Patient: Steve [name]. ENI:[id number] DOB: [birth year] DOA: 06/25/2022  DOS: the patient was seen and examined on 07/10/2022  Brief hospital course: Steve [name]. is a 81 y.o. male with a history of CAD s/p CABG, hypertension, hyperlipidemia, diabetes mellitus type 2, PAD, atrial fibrillation. Patient presented secondary to tremors and was found to have fevers with concern for possible infection. SIRS criteria met without source identified. Patient started empirically on Vancomycin, Cefepime and Flagyl. Blood and urine cultures obtained. MRI significant for osteomyelitis. Podiatry and vascular surgery consulted. S/P angiogram on 9/8.  Recommendation is for femoropopliteal bypass currently scheduled on 9/13.  Will remain on IV heparin until then.  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  Associated with cellulitis. Osteomyelitis of the fifth metatarsal head/neck in addition to early osteomyelitis of the fifth digit proximal phalanx on MRI from 9/3. Blood and urine cultures are no growth.  Empiric Vancomycin, Cefepime and Flagyl initiated on admission and patient has been deescalated to Cefepime.   As of 9/6 now on cefazolin. -Status post left partial fifth ray amputation -transition antibiotics to po   AKI on CKD stage IIIa Patient's baseline creatinine appears to be around 1.2. Creatinine of 2.47 on admission. Treated with IV fluids. Mild worsening after angiography.  Currently creatinine down to 1.4 Monitor.   Hyponatremia Sodium of 126 on admission. Likely related to dehydration.  Resolved    Acute urinary retention Unsure of etiology.  Patient appears to be asymptomatic.  Urinalysis is not suggestive of infection. Patient with continued urinary retention -Foley catheter inserted on 9/3.  Attempt voiding trial on 9/16 after foot surgery. -Outpatient urology  follow-up   Constipation Resolved.  Continue current bowel regimen.   Diabetes mellitus type 2 uncontrolled with hyperglycemia with nonhealing wounds. Patient is on Januvia, Levemir and metformin as an outpatient. Most recent hemoglobin A1C is 7.9%. -Continue Levemir and SSI Blood sugar stable   CAD Hyperlipidemia PAD No chest pain. Stable. Continue Zetia, on Repatha outpatient. ABI significantly abnormal.  Angiography shows evidence of significant stenotic disease. S/p left common femoral-peroneal artery bypass   Primary hypertension -Continue Imdur and Toprol XL   Paroxysmal atrial fibrillation -Patient was on Eliquis and Toprol XL Anticoagulation currently on hold due to surgery today -resume eliquis today   Thrombocytopenia No current bleeding. stable.   Acute encephalopathy. Mostly delirium Etiology not clear  most likely combination of prolonged hospitalization and medication side effect.  Unable to differentiate further whether metabolic or toxic. Now resolved. Monitor.   Prolonged QTc. QTc is 501. Monitor for now.  Avoid QT prolonging medications.  Right ankle pain. Patient reports that his right ankle is also hurting significantly for last few days. X-ray ankle negative for any acute abnormality.  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 -currently volume status appears compensated   Subjective: no new complaints, feels well  Physical Exam: Vitals:   07/09/22 2320 07/10/22 0823 07/10/22 1121 07/10/22 1510  BP: (!) 156/64 (!) 153/62 (!) 155/63 (!) 148/60  Pulse: 65 (!) 58 64 68  Resp: _0 Temp: 98.5 F (36.9 C) 98.3 F (36.8 C) 97.9 F (36.6 C) 98.4 F (36.9 C)  TempSrc: Oral Oral Oral  Oral  SpO2: 95% 95% 95% 99%  Weight:      Height:        General: sitting in up bed, no distress, skin appears pale Cardiovascular: S1 and S2 Present, aortic systolic Murmur, Respiratory: good respiratory effort, Bilateral Air entry present and CTA, no Crackles, no wheezes Abdomen: Bowel Sound present, Non tender Extremities: Left foot is wrapped in dressing Neurology: alert and oriented to Self, Place and time.   Data Reviewed: I have Reviewed nursing notes, Vitals, and Lab results since pt's last encounter. Pertinent lab results BMP I have ordered test including CBC and BMP    Family Communication: discussed with son at the bedside 9/15  Disposition: Status is: Inpatient Remains inpatient appropriate because: post op care  Author: Kathie Dike, MD 07/10/2022 7:48 PM  Please look on [email or web address] to find out who is on call.

## 2022-07-10 NOTE — Anesthesia Postprocedure Evaluation (Signed)
Anesthesia Post Note  Patient: Steve [name].  Procedure(s) Performed: PARTIAL LEFT FIFTH RAY AMPUTATION (Left: Foot)     Patient location during evaluation: PACU Anesthesia Type: MAC Level of consciousness: awake and alert Pain management: pain level controlled Vital Signs Assessment: post-procedure vital signs reviewed and stable Respiratory status: spontaneous breathing, nonlabored ventilation, respiratory function stable and patient connected to nasal cannula oxygen Cardiovascular status: stable and blood pressure returned to baseline Postop Assessment: no apparent nausea or vomiting Anesthetic complications: no   No notable events documented.  Last Vitals:  Vitals:   07/09/22 1935 07/09/22 2320  BP: (!) 170/52 (!) 156/64  Pulse: 90 65  Resp: 16 20  Temp: 36.7 C 36.9 C  SpO2: 99% 95%    Last Pain:  Vitals:   07/09/22 2320  TempSrc: Oral  PainSc:                  Santa Lighter

## 2022-07-10 NOTE — Progress Notes (Signed)
Mobility Specialist Progress Note:   07/10/22 1052  Mobility  Activity Ambulated with assistance in hallway  Level of Assistance Standby assist, set-up cues, supervision of patient - no hands on  Assistive Device Front wheel walker  Distance Ambulated (ft) 150 ft  Activity Response Tolerated well  $Mobility charge 1 Mobility   Pt received in bed willing to participate in mobility. Complaints of pain in R calf. Left in bed with call bell in reach and all needs met.   Steve [name] Mobility Specialist Secure Chat only   

## 2022-07-10 NOTE — Progress Notes (Signed)
Pharmacy Antibiotic Note  Steve [name]. is a 81 y.o. male admitted on 06/25/2022 with left foot wound with concerns for osteomyelitis. Pharmacy has been consulted for Cefazolin dosing. Pt is afebrile. WBC wnl. Scr relatively stable likely at baseline. Wound stable. Podiatry recommend discharge on oral abx for 7 days.   Plan: Continue cefazolin 2g IV Q8h Trend WBC, fever, renal function Following with MD for oral abx transition   Height: 5\' 9"  (175.3 cm) Weight: 86 kg (189 lb 9.5 oz) IBW/kg (Calculated) : 70.7  Temp (24hrs), Avg:98.1 F (36.7 C), Min:97.7 F (36.5 C), Max:98.5 F (36.9 C)  Recent Labs  Lab 07/06/22 0354 07/07/22 0413 07/07/22 1801 07/08/22 0730 07/09/22 0337 07/10/22 0223  WBC 4.7 4.9 8.4 10.3 9.0 8.0  CREATININE 1.45* 1.35*  --  1.53* 1.48* 1.45*     Estimated Creatinine Clearance: 41.9 mL/min (A) (by C-G formula based on SCr of 1.45 mg/dL (H)).    Allergies  Allergen Reactions   Januvia [Sitagliptin] Other (See Comments)    Soreness to stomach area. Intolerance    Statins Other (See Comments)    Myalgias, muscle weakness, swelling, pain    Antimicrobials this admission: Cefepime 9/1 >> 9/05 Metronidazole 9/1 >> 9/4 Vancomycin 9/1 >> 9/2 Cefazolin 9/6 >>   Microbiology results: 9/1 BCx: NG final 9/1 UCx: NG final   Thank you for allowing [place] to participate in this patients care.  Gena Fray, PharmD PGY1 Pharmacy Resident   07/10/2022 3:27 PM  **Pharmacist phone directory can be found on [email or web address] listed under [place]**

## 2022-07-11 DIAGNOSIS — E860 Dehydration: Secondary | ICD-10-CM | POA: Diagnosis not present

## 2022-07-11 DIAGNOSIS — E871 Hypo-osmolality and hyponatremia: Secondary | ICD-10-CM | POA: Diagnosis not present

## 2022-07-11 DIAGNOSIS — A419 Sepsis, unspecified organism: Secondary | ICD-10-CM | POA: Diagnosis not present

## 2022-07-11 DIAGNOSIS — N179 Acute kidney failure, unspecified: Secondary | ICD-10-CM | POA: Diagnosis not present

## 2022-07-11 LAB — GLUCOSE, CAPILLARY
Glucose-Capillary: 181 mg/dL — ABNORMAL HIGH (ref 70–99)
Glucose-Capillary: 220 mg/dL — ABNORMAL HIGH (ref 70–99)
Glucose-Capillary: 202 mg/dL — ABNORMAL HIGH (ref 70–99)
Glucose-Capillary: 177 mg/dL — ABNORMAL HIGH (ref 70–99)
Glucose-Capillary: 165 mg/dL — ABNORMAL HIGH (ref 70–99)

## 2022-07-11 LAB — CBC
HCT: 24.6 % — ABNORMAL LOW (ref 39.0–52.0)
Hemoglobin: 8.3 g/dL — ABNORMAL LOW (ref 13.0–17.0)
MCH: 31.7 pg (ref 26.0–34.0)
MCHC: 33.7 g/dL (ref 30.0–36.0)
MCV: 93.9 fL (ref 80.0–100.0)
Platelets: 100 10*3/uL — ABNORMAL LOW (ref 150–400)
RBC: 2.62 MIL/uL — ABNORMAL LOW (ref 4.22–5.81)
RDW: 16.7 % — ABNORMAL HIGH (ref 11.5–15.5)
WBC: 7.3 10*3/uL (ref 4.0–10.5)
nRBC: 0 % (ref 0.0–0.2)

## 2022-07-11 LAB — MAGNESIUM: Magnesium: 2 mg/dL (ref 1.7–2.4)

## 2022-07-11 LAB — BASIC METABOLIC PANEL
Anion gap: 7 (ref 5–15)
BUN: 26 mg/dL — ABNORMAL HIGH (ref 8–23)
CO2: 22 mmol/L (ref 22–32)
Calcium: 8.4 mg/dL — ABNORMAL LOW (ref 8.9–10.3)
Chloride: 108 mmol/L (ref 98–111)
Creatinine, Ser: 1.5 mg/dL — ABNORMAL HIGH (ref 0.61–1.24)
GFR, Estimated: 46 mL/min — ABNORMAL LOW (ref >60)
Glucose, Bld: 194 mg/dL — ABNORMAL HIGH (ref 70–99)
Potassium: 3.8 mmol/L (ref 3.5–5.1)
Sodium: 137 mmol/L (ref 135–145)

## 2022-07-11 MED ORDER — FUROSEMIDE 10 MG/ML IJ SOLN
20.0000 mg | Freq: Once | INTRAMUSCULAR | Status: AC
  Administered 2022-07-11: 20 mg via INTRAVENOUS
  Filled 2022-07-11: qty 2

## 2022-07-11 MED ORDER — ORAL CARE MOUTH RINSE: 15.0000 mL | OROMUCOSAL | Status: DC | PRN

## 2022-07-11 NOTE — Progress Notes (Signed)
Mobility Specialist Progress Note:   07/11/22 1118  Mobility  Activity Ambulated with assistance to bathroom  Level of Assistance Modified independent, requires aide device or extra time  Assistive Device Front wheel walker  Distance Ambulated (ft) 20 ft  Activity Response Tolerated well  $Mobility charge 1 Mobility   Pt in bed needing to use restroom. No complaints. Left in bed with call bell in reach and all needs met.   [facility] Surveyor, mining Chat only

## 2022-07-11 NOTE — Progress Notes (Signed)
Progress Note Patient: Steve [name]. JJH:[id number] DOB: 03/04/1941 DOA: 06/25/2022  DOS: the patient was seen and examined on 07/11/2022  Brief hospital course: Steve [name]. is a 81 y.o. male with a history of CAD s/p CABG, hypertension, hyperlipidemia, diabetes mellitus type 2, PAD, atrial fibrillation. Patient presented secondary to tremors and was found to have fevers with concern for possible infection. SIRS criteria met without source identified. Patient started empirically on Vancomycin, Cefepime and Flagyl. Blood and urine cultures obtained. MRI significant for osteomyelitis. Podiatry and vascular surgery consulted. S/P angiogram on 9/8.  Recommendation is for femoropopliteal bypass currently scheduled on 9/13.  Will remain on IV heparin until then.  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  Associated with cellulitis. Osteomyelitis of the fifth metatarsal head/neck in addition to early osteomyelitis of the fifth digit proximal phalanx on MRI from 9/3. Blood and urine cultures are no growth.  Empiric Vancomycin, Cefepime and Flagyl initiated on admission and patient has been deescalated to Cefepime.   As of 9/6 now on cefazolin. -Status post left partial fifth ray amputation -transition antibiotics to po   AKI on CKD stage IIIa Patient's baseline creatinine appears to be around 1.2. Creatinine of 2.47 on admission. Treated with IV fluids. Mild worsening after angiography.  Currently creatinine down to 1.5 Monitor.   Hyponatremia Sodium of 126 on admission. Likely related to dehydration.  Resolved    Acute urinary retention Unsure of etiology.  Patient appears to be asymptomatic.  Urinalysis is not suggestive of infection. Patient with continued urinary retention -Foley catheter inserted on 9/3.   -Catheter removed on 9/16 and patient reports that he is able to pass  urine. -Outpatient urology follow-up   Constipation Resolved.  Continue current bowel regimen.   Diabetes mellitus type 2 uncontrolled with hyperglycemia with nonhealing wounds. Patient is on Januvia, Levemir and metformin as an outpatient. Most recent hemoglobin A1C is 7.9%. -Continue Levemir and SSI Blood sugar stable   CAD Hyperlipidemia PAD No chest pain. Stable. Continue Zetia, on Repatha outpatient. ABI significantly abnormal.  Angiography shows evidence of significant stenotic disease. S/p left common femoral-peroneal artery bypass   Primary hypertension -Continue Imdur and Toprol XL   Paroxysmal atrial fibrillation -Heart rate stable on Toprol -Anticoagulated with Eliquis   Thrombocytopenia No current bleeding. stable.   Acute encephalopathy. Mostly delirium Etiology not clear  most likely combination of prolonged hospitalization and medication side effect.  Unable to differentiate further whether metabolic or toxic. Now resolved. Monitor.   Prolonged QTc. QTc is 501. Monitor for now.  Avoid QT prolonging medications.  Right ankle pain. Patient reports that his right ankle is also hurting significantly for last few days. X-ray ankle negative for any acute abnormality.  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 -*Did develop some signs of edema -We will give 1 dose of IV Lasix today   Subjective: Says he has been able to pass some urine after Foley catheter was removed.  Ambulating.  Physical Exam: Vitals:   07/11/22 0415 07/11/22 0818 07/11/22 1150 07/11/22 1700  BP: (!) 151/67 (!) 155/55 (!) 151/55 (!) 165/63  Pulse: (!) 54 62 63 66  Resp: _0 Temp: 97.8 F (36.6  C) 97.9 F (36.6 C) 97.8 F (36.6 C) 97.7 F (36.5 C)  TempSrc: Oral Oral Oral  Oral  SpO2: 100% 99% 98% 99%  Weight:      Height:       General: sitting in up bed, no distress, skin appears pale Cardiovascular: S1 and S2 Present, aortic systolic Murmur, Respiratory: good respiratory effort, Bilateral Air entry present and CTA, no Crackles, no wheezes Abdomen: Bowel Sound present, Non tender Extremities: Left foot is wrapped in dressing.  He has 1-2+ edema bilaterally Neurology: alert and oriented to Self, Place and time.   Data Reviewed: I have Reviewed nursing notes, Vitals, and Lab results since pt's last encounter. Pertinent lab results BMP I have ordered test including CBC and BMP    Family Communication: discussed with son at the bedside 9/15  Disposition: Status is: Inpatient Remains inpatient appropriate because: IV Lasix today, hopefully home in the next 24 hours  Author: Kathie Dike, MD 07/11/2022 7:16 PM  Please look on [email or web address] to find out who is on call.

## 2022-07-11 NOTE — Progress Notes (Addendum)
[facility] [place]  Subjective  - No new issues.  He states he is not having any pain currently   Objective (!) 155/55 62 97.9 F (36.6 C) (Oral) 18 99%  Intake/Output Summary (Last 24 hours) at 07/11/2022 0848 Last data filed at 07/11/2022 0820 Gross per 24 hour  Intake 1100 ml  Output 2065 ml  Net -965 ml    Left LE with peroneal doppler signal Distal incision with minimal bloody drainage, no erythema.  Dressing changed. Groin soft with out hematoma, healing well Lungs non labored breathing   Assessment/Planning: POD # 4 Left common femoral - peroneal artery bypass with 42mm PTFE in subcutaneous tunnel POD# 2 fifth ray amputation by DPM PT re- ordered for eval and recommendation.  Patient requested to return home.    Roxy Horseman 07/11/2022 8:48 AM --  Laboratory Lab Results: Recent Labs    07/09/22 0337 07/10/22 0223  WBC 9.0 8.0  HGB 9.0* 9.3*  HCT 26.7* 28.1*  PLT 94* 100*   BMET Recent Labs    07/09/22 0337 07/10/22 0223  NA 141 137  K 4.1 5.0  CL 110 107  CO2 22 23  GLUCOSE 155* 243*  BUN 18 19  CREATININE 1.48* 1.45*  CALCIUM 8.4* 8.2*    COAG Lab Results  Component Value Date   INR 1.06 12/17/2018   INR 1.30 07/24/2018   INR 1.09 04/01/2015   No results found for: "PTT"   I have independently interviewed and examined patient and agree with PA assessment and plan above.  Patient was seen walking in the hall and as long as okay with primary team and physical therapy should be okay for discharge home from a vascular standpoint.  Karin Pinedo C. Donzetta Matters, MD [facility] Office: [phone] Pager: [phone]

## 2022-07-11 NOTE — Progress Notes (Signed)
Mobility Specialist Progress Note:   07/11/22 0926  Mobility  Activity Ambulated with assistance in hallway  Level of Assistance Standby assist, set-up cues, supervision of patient - no hands on  Assistive Device Front wheel walker  Distance Ambulated (ft) 500 ft  Activity Response Tolerated well  $Mobility charge 1 Mobility   Pt received in bed willing to participate in mobility. No complaints of pain. Left in bed with call bell in reach and all needs met.   [facility] Surveyor, mining Chat only

## 2022-07-12 ENCOUNTER — Ambulatory Visit (HOSPITAL_COMMUNITY): Payer: Medicare Other | Attending: General Surgery

## 2022-07-12 ENCOUNTER — Encounter (HOSPITAL_COMMUNITY): Payer: Self-pay

## 2022-07-12 DIAGNOSIS — A419 Sepsis, unspecified organism: Secondary | ICD-10-CM | POA: Diagnosis not present

## 2022-07-12 DIAGNOSIS — N179 Acute kidney failure, unspecified: Secondary | ICD-10-CM | POA: Diagnosis not present

## 2022-07-12 DIAGNOSIS — E871 Hypo-osmolality and hyponatremia: Secondary | ICD-10-CM | POA: Diagnosis not present

## 2022-07-12 DIAGNOSIS — E860 Dehydration: Secondary | ICD-10-CM | POA: Diagnosis not present

## 2022-07-12 LAB — CBC
HCT: 27.2 % — ABNORMAL LOW (ref 39.0–52.0)
Hemoglobin: 9.1 g/dL — ABNORMAL LOW (ref 13.0–17.0)
MCH: 31.1 pg (ref 26.0–34.0)
MCHC: 33.5 g/dL (ref 30.0–36.0)
MCV: 92.8 fL (ref 80.0–100.0)
Platelets: 112 10*3/uL — ABNORMAL LOW (ref 150–400)
RBC: 2.93 MIL/uL — ABNORMAL LOW (ref 4.22–5.81)
RDW: 16.6 % — ABNORMAL HIGH (ref 11.5–15.5)
WBC: 7.8 10*3/uL (ref 4.0–10.5)
nRBC: 0 % (ref 0.0–0.2)

## 2022-07-12 LAB — GLUCOSE, CAPILLARY
Glucose-Capillary: 186 mg/dL — ABNORMAL HIGH (ref 70–99)
Glucose-Capillary: 180 mg/dL — ABNORMAL HIGH (ref 70–99)
Glucose-Capillary: 159 mg/dL — ABNORMAL HIGH (ref 70–99)
Glucose-Capillary: 108 mg/dL — ABNORMAL HIGH (ref 70–99)

## 2022-07-12 LAB — BASIC METABOLIC PANEL
Anion gap: 12 (ref 5–15)
BUN: 24 mg/dL — ABNORMAL HIGH (ref 8–23)
CO2: 24 mmol/L (ref 22–32)
Calcium: 8.7 mg/dL — ABNORMAL LOW (ref 8.9–10.3)
Chloride: 101 mmol/L (ref 98–111)
Creatinine, Ser: 1.35 mg/dL — ABNORMAL HIGH (ref 0.61–1.24)
GFR, Estimated: 52 mL/min — ABNORMAL LOW (ref >60)
Glucose, Bld: 186 mg/dL — ABNORMAL HIGH (ref 70–99)
Potassium: 3.7 mmol/L (ref 3.5–5.1)
Sodium: 137 mmol/L (ref 135–145)

## 2022-07-12 MED ORDER — POTASSIUM CHLORIDE CRYS ER 20 MEQ PO TBCR
40.0000 meq | EXTENDED_RELEASE_TABLET | Freq: Once | ORAL | Status: AC
  Administered 2022-07-12: 40 meq via ORAL
  Filled 2022-07-12: qty 2

## 2022-07-12 MED ORDER — VITAMIN C 500 MG PO TABS
500.0000 mg | ORAL_TABLET | Freq: Two times a day (BID) | ORAL | Status: DC
  Administered 2022-07-12 – 2022-07-13 (×2): 500 mg via ORAL
  Filled 2022-07-12 (×3): qty 1

## 2022-07-12 MED ORDER — ZINC SULFATE 220 (50 ZN) MG PO CAPS
220.0000 mg | ORAL_CAPSULE | Freq: Every day | ORAL | Status: DC
  Administered 2022-07-12 – 2022-07-13 (×2): 220 mg via ORAL
  Filled 2022-07-12 (×2): qty 1

## 2022-07-12 MED ORDER — FUROSEMIDE 10 MG/ML IJ SOLN
20.0000 mg | Freq: Once | INTRAMUSCULAR | Status: AC
  Administered 2022-07-12: 20 mg via INTRAVENOUS
  Filled 2022-07-12: qty 2

## 2022-07-12 MED ORDER — POLYETHYLENE GLYCOL 3350 17 G PO PACK
17.0000 g | PACK | Freq: Three times a day (TID) | ORAL | Status: DC
  Administered 2022-07-12: 17 g via ORAL
  Filled 2022-07-12 (×3): qty 1

## 2022-07-12 NOTE — Progress Notes (Signed)
Nutrition Follow-up  DOCUMENTATION CODES:   Not applicable  INTERVENTION:   -Continue Ensure Enlive po TID, each supplement provides 350 kcal and 20 grams of protein -Continue MVI with minerals daily -500 mg vitamin C BID -220 mg zinc sulfate daily x 14 days  NUTRITION DIAGNOSIS:   Increased nutrient needs related to acute illness as evidenced by estimated needs.  Ongoing  GOAL:   Patient will meet greater than or equal to 90% of their needs  Progressing   MONITOR:   PO intake, Supplement acceptance  REASON FOR ASSESSMENT:   Consult Assessment of nutrition requirement/status  ASSESSMENT:   Pt with medical history significant of CAD s/p CABG; HTN; HLD; DM; PAD; and afib presenting with tremors. He reports that he felt bad today.  9/15- s/p Partial fifth ray amputation left foot  Reviewed I/O's: -1.8 L x 24 hours and -14.3 L since 06/28/22  UOP: 2.1 L x 24 hours   Spoke with pt at bedside, who was pleasant and in good spirits today. He reports his appetite "is [place]" depending on how he is feeling. He complains that his "bladder is full and I haven't pooped in two days". He denies having any difficulty with bowel movements PTA. Noted meal completions 100%. Per pt, he usually consumes eggs and toast for breakfast and chicken salad sandwich OR meat and potatoes for lunch and dinner. He has no teeth, but denies any difficulty chewing or swallowing preferred foods. Offered to downgrade diet, however, pt prefers to remain on diet so he can pick and choose which foods he can tolerate. He is consistent with drinking Ensure supplements and likes them.   Discussed importance of good meal and supplement intake to promote healing. He shares he has good control of DM at home and typically has CBGS under 200. Discussed importance of optimal glycemic control as well as foot care (pt has history of neuropathy) to also assist with post-op healing.   Medications reviewed and include  colace and miralax (just ordered today).   Labs reviewed: CBGS: 165-202 (inpatient orders for glycemic control are 0-15 units insulin aspart TID with meals, 0-5 units insulin aspart daily at bedtime, and 15 units insulin detemir daily).    Diet Order:   Diet Order             Diet regular Room service appropriate? Yes with Assist; Fluid consistency: Thin  Diet effective now                   EDUCATION NEEDS:   No education needs have been identified at this time  Skin:  Skin Assessment: Skin Integrity Issues: Skin Integrity Issues:: Incisions Incisions: closed lt foot s/p 5th ray amputation  Last BM:  07/12/22  Height:   Ht Readings from Last 1 Encounters:  07/09/22 5\' 9"  (1.753 m)    Weight:   Wt Readings from Last 1 Encounters:  07/12/22 86.1 kg    Ideal Body Weight:  72.7 kg  BMI:  Body mass index is 28.03 kg/m.  Estimated Nutritional Needs:   Kcal:  1950-2150  Protein:  105-120 grams  Fluid:  > 1.9 L    Loistine Chance, RD, LDN, [place] Registered Dietitian II Certified Diabetes Care and Education Specialist Please refer to [facility] for RD and/or RD on-call/weekend/after hours pager

## 2022-07-12 NOTE — Progress Notes (Addendum)
  Progress Note    07/12/2022 6:58 AM 3 Days Post-Op  Subjective:  pt had issues with voiding and bowel movements over night.  I&O cath with 1500 cc out.    Afebrile HR 60's-110's  749'S-496'P systolic 59% RA  Vitals:   07/11/22 2300 07/12/22 0432  BP: (!) 150/75 (!) 165/65  Pulse: 97 71  Resp: 20 20  Temp: 97.7 F (36.5 C) 97.8 F (36.6 C)  SpO2: 94% 99%    Physical Exam: Pt in bed awake and alert  CBC    Component Value Date/Time   WBC 7.3 07/11/2022 1032   RBC 2.62 (L) 07/11/2022 1032   HGB 8.3 (L) 07/11/2022 1032   HGB 9.9 (L) 07/24/2018 1423   HCT 24.6 (L) 07/11/2022 1032   PLT 100 (L) 07/11/2022 1032   PLT 128 (L) 07/24/2018 1423   MCV 93.9 07/11/2022 1032   MCH 31.7 07/11/2022 1032   MCHC 33.7 07/11/2022 1032   RDW 16.7 (H) 07/11/2022 1032   LYMPHSABS 0.3 (L) 06/25/2022 1550   MONOABS 0.2 06/25/2022 1550   EOSABS 0.1 06/25/2022 1550   BASOSABS 0.0 06/25/2022 1550    BMET    Component Value Date/Time   NA 137 07/11/2022 1032   K 3.8 07/11/2022 1032   CL 108 07/11/2022 1032   CO2 22 07/11/2022 1032   GLUCOSE 194 (H) 07/11/2022 1032   BUN 26 (H) 07/11/2022 1032   CREATININE 1.50 (H) 07/11/2022 1032   CALCIUM 8.4 (L) 07/11/2022 1032   GFRNONAA 46 (L) 07/11/2022 1032   GFRAA 46 (L) 05/19/2020 1311    INR    Component Value Date/Time   INR 1.06 12/17/2018 1157     Intake/Output Summary (Last 24 hours) at 07/12/2022 0658 Last data filed at 07/12/2022 0030 Gross per 24 hour  Intake 360 ml  Output 575 ml  Net -215 ml     Assessment/Plan:  81 y.o. male is s/p:  Left common femoral - peroneal artery bypass with 95mm PTFE in subcutaneous tunnel 5 Days Post Op and fifth ray amputation by DPM 3 Days Post Op  3 Days Post-Op   -pt needed enema and I&O cath over night.   -still with issues with bowel movement this am-will come back later this morning to further examine pt.   -awaiting PT re-evaluation -DVT prophylaxis:  Eliquis   Leontine Locket, PA-C [facility] [phone] 07/12/2022 6:58 AM  VASCULAR STAFF ADDENDUM: I have independently interviewed and examined the patient. I agree with the above.  Clean bandages. Foot well perfused. Follow up with VVS PA in clinic in 2-3 weeks for incision check and staple removal.  Please call for questions.  [name], MD [facility] Phone Number: [phone] 07/12/2022 11:55 AM

## 2022-07-12 NOTE — Progress Notes (Signed)
Mobility Specialist Progress Note:   07/12/22 0906  Mobility  Activity Refused mobility   Pt stating he didn't sleep last night d/t having to use BSC frequently. Will follow-up as time allows.   [facility] Surveyor, mining Chat only

## 2022-07-12 NOTE — Progress Notes (Signed)
Pt has been complaning of constipation since bed side report, and has been in bathroom most of the night. Pt received scheduled and prn bowel medication along with soap suds enema. Pt was still having hard time passing stool, and was constantly in restroom. Pt had urinated on the floor during bedside report, urinated 200 cc, 100 cc and on the floor again throughout the night.  0200: called to pt's room saying he cannot pass stool nor urinate. Pt was trying to have BM. Placed pt back in bed, bladder scan done. Bladder scan showed 857cc, 588 cc. On call MD Dr. Hal Hope paged, order received for one time I/o cath.  0220: I/o cath completed, with out put of 1550 cc clear yellow urine, pt tolerated well.per notes, pt has been having problem with residual urine since foley cath came out.  Plan of care continues.

## 2022-07-12 NOTE — Care Management Important Message (Signed)
Important Message  Patient Details  Name: Steve [name]. MRN: [id number] Date of Birth: 1941-07-31   Medicare Important Message Given:  Yes     [name] 07/12/2022, 10:54 AM

## 2022-07-12 NOTE — Evaluation (Signed)
Physical Therapy Re-Evaluation Patient Details Name: Steve [name]. MRN: [id number] DOB: [birth year] Today's Date: 07/12/2022  History of Present Illness  Pt is an 81 y.o. male admitted from [facility] on 06/25/22 with tremors, feeling poorly. Workup for AKI, hyponatremia, sepsis, L foot nonhealing wound. S/p aortogram, LLE angiogram 9/8. 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  PMH includes CAD s/p CABG, HTN, HLD, DM, PAD, afib.  Clinical Impression  Pt admitted with above diagnosis. Pt now with toe amputated and is still able to mobilize without assist. CClaritying with MD regarding if he will allow ambulation or if he wants transfers only.  Pt should be ok to go to ILF with HHPT and use of equipment that he has.  Goals revised.   Pt currently with functional limitations due to the deficits listed below (see PT Problem List). Pt will benefit from skilled PT to increase their independence and safety with mobility to allow discharge to the venue listed below.          Recommendations for follow up therapy are one component of a multi-disciplinary discharge planning process, led by the attending physician.  Recommendations may be updated based on patient status, additional functional criteria and insurance authorization.  Follow Up Recommendations Home health PT      Assistance Recommended at Discharge PRN  Patient can return home with the following  Assistance with cooking/housework;Assist for transportation;Help with stairs or ramp for entrance    Equipment Recommendations None recommended by PT  Recommendations for Other Services       Functional Status Assessment Patient has had a recent decline in their functional status and demonstrates the ability to make significant improvements in function in a reasonable and predictable amount of time.     Precautions / Restrictions Precautions Precautions: Fall Required Braces or Orthoses: Other  Brace Other Brace: post op shoe Restrictions Weight Bearing Restrictions: Yes LLE Weight Bearing:  (weight bear on heel only)      Mobility  Bed Mobility Overal bed mobility: Modified Independent             General bed mobility comments: no assistance for supine to sit and sit to supine    Transfers Overall transfer level: Needs assistance Equipment used: Rolling walker (2 wheels) Transfers: Sit to/from Stand, Bed to chair/wheelchair/BSC Sit to Stand: Supervision Stand pivot transfers: Supervision         General transfer comment: No assist or cues needed for sit to stand as well as for stand pivot with RW to recliner. Did not ambulate pt as PT clarifying if MD desires transfers only based on a note.    Ambulation/Gait                  Stairs            Wheelchair Mobility    Modified Rankin (Stroke Patients Only)       Balance Overall balance assessment: Needs assistance Sitting-balance support: No upper extremity supported, Feet supported Sitting balance-Leahy Scale: Good     Standing balance support: No upper extremity supported, During functional activity Standing balance-Leahy Scale: Fair Standing balance comment: can static stand without UE support for ADL grooming tasks at sink                             Pertinent Vitals/Pain Pain Assessment Pain Assessment: Faces Faces Pain Scale: Hurts even more Pain  Location: LLE Pain Descriptors / Indicators: Discomfort, Sore, Moaning Pain Intervention(s): Limited activity within patient's tolerance, Monitored during session, Repositioned    Home Living Family/patient expects to be discharged to:: Private residence (independent living) Living Arrangements: Alone Available Help at Discharge: Personal care attendant Type of Home: Independent living facility Home Access: Level entry       Home Layout: One level Home Equipment: Conservation officer, nature (2 wheels);Rollator (4  wheels);Electric scooter Additional Comments: Patient uses rollator to walk to dining room; does not use device in apartment; uses RW when goes out to appointments    Prior Function Prior Level of Function : Independent/Modified Independent             Mobility Comments: Patient uses rollator to walk to dining room; does not use device in apartment; uses RW when goes out to appointments ADLs Comments: reports independent with BADLs     Hand Dominance   Dominant Hand: Right    Extremity/Trunk Assessment   Upper Extremity Assessment Upper Extremity Assessment: Defer to OT evaluation    Lower Extremity Assessment Lower Extremity Assessment: LLE deficits/detail LLE Deficits / Details: grossly 3/5    Cervical / Trunk Assessment Cervical / Trunk Assessment: Kyphotic  Communication   Communication: No difficulties  Cognition Arousal/Alertness: Awake/alert Behavior During Therapy: WFL for tasks assessed/performed Overall Cognitive Status: Within Functional Limits for tasks assessed                                 General Comments: motivated with therapy        General Comments General comments (skin integrity, edema, etc.): VSS    Exercises General Exercises - Lower Extremity Ankle Circles/Pumps: AROM, Both, 10 reps, Supine Long Arc Quad: AROM, Both, 10 reps, Seated Hip Flexion/Marching: AROM, Both, 10 reps, Seated   Assessment/Plan    PT Assessment Patient needs continued PT services  PT Problem List Decreased strength;Decreased balance;Decreased mobility;Decreased knowledge of use of DME       PT Treatment Interventions DME instruction;Gait training;Functional mobility training;Therapeutic activities;Therapeutic exercise;Balance training;Patient/family education    PT Goals (Current goals can be found in the Care Plan section)  Acute Rehab PT Goals Patient Stated Goal: return to [place] living PT Goal Formulation: With patient Time  For Goal Achievement: 07/26/22 Potential to Achieve Goals: Good    Frequency Min 5X/week     Co-evaluation               AM-PAC PT "6 Clicks" Mobility  Outcome Measure Help needed turning from your back to your side while in a flat bed without using bedrails?: None Help needed moving from lying on your back to sitting on the side of a flat bed without using bedrails?: None Help needed moving to and from a bed to a chair (including a wheelchair)?: A Little Help needed standing up from a chair using your arms (e.g., wheelchair or bedside chair)?: A Little Help needed to walk in hospital room?: A Little Help needed climbing 3-5 steps with a railing? : A Little 6 Click Score: 20    End of Session Equipment Utilized During Treatment: Gait belt Activity Tolerance: Patient tolerated treatment well Patient left: with call bell/phone within reach;in chair;with chair alarm set Nurse Communication: Mobility status PT Visit Diagnosis: Difficulty in walking, not elsewhere classified (R26.2)    Time: 1500-1520 PT Time Calculation (min) (ACUTE ONLY): 20 min   Charges:   PT Evaluation $  PT Re-evaluation: 1 Re-eval          Kerith Sherley M,PT Acute Rehab Services [phone]   Alvira Philips 07/12/2022, 4:18 PM

## 2022-07-12 NOTE — Progress Notes (Signed)
Progress Note Patient: Steve [name]. JXB:[id number] DOB: [birth year] DOA: 06/25/2022  DOS: the patient was seen and examined on 07/12/2022  Brief hospital course: Steve [name]. is a 81 y.o. male with a history of CAD s/p CABG, hypertension, hyperlipidemia, diabetes mellitus type 2, PAD, atrial fibrillation. Patient presented secondary to tremors and was found to have fevers with concern for possible infection. SIRS criteria met without source identified. Patient started empirically on Vancomycin, Cefepime and Flagyl. Blood and urine cultures obtained. MRI significant for osteomyelitis. Podiatry and vascular surgery consulted. S/P angiogram on 9/8.  Recommendation is for femoropopliteal bypass currently scheduled on 9/13.  Will remain on IV heparin until then.  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  Associated with cellulitis. Osteomyelitis of the fifth metatarsal head/neck in addition to early osteomyelitis of the fifth digit proximal phalanx on MRI from 9/3. Blood and urine cultures are no growth.  Empiric Vancomycin, Cefepime and Flagyl initiated on admission and patient has been deescalated to Cefepime.   As of 9/6 now on cefazolin. -Status post left partial fifth ray amputation -transition antibiotics to po   AKI on CKD stage IIIa Patient's baseline creatinine appears to be around 1.2. Creatinine of 2.47 on admission. Treated with IV fluids. Mild worsening after angiography.  Currently creatinine down to 1.35 Monitor.   Hyponatremia Sodium of 126 on admission. Likely related to dehydration.  Resolved    Acute urinary retention Unsure of etiology.  Patient appears to be asymptomatic.  Urinalysis is not suggestive of infection. Patient with continued urinary retention -Foley catheter inserted on 9/3.   -Catheter removed on 9/16 and patient reports that he is able to pass  urine. -Unfortunately, developed urinary retention again 9/18 -We will replace Foley catheter.  Will need to continue on discharge until he can see urology -Outpatient urology follow-up   Constipation Received enema overnight Continue on MiraLAX   Diabetes mellitus type 2 uncontrolled with hyperglycemia with nonhealing wounds. Patient is on Januvia, Levemir and metformin as an outpatient. Most recent hemoglobin A1C is 7.9%. -Continue Levemir and SSI Blood sugar stable   CAD Hyperlipidemia PAD No chest pain. Stable. Continue Zetia, on Repatha outpatient. ABI significantly abnormal.  Angiography shows evidence of significant stenotic disease. S/p left common femoral-peroneal artery bypass   Primary hypertension -Continue Imdur and Toprol XL   Paroxysmal atrial fibrillation -Heart rate stable on Toprol -Anticoagulated with Eliquis   Thrombocytopenia No current bleeding. stable.   Acute encephalopathy. Mostly delirium Etiology not clear  most likely combination of prolonged hospitalization and medication side effect.  Unable to differentiate further whether metabolic or toxic. Now resolved. Monitor.   Prolonged QTc. QTc is 501. Monitor for now.  Avoid QT prolonging medications.  Right ankle pain. Patient reports that his right ankle is also hurting significantly for last few days. X-ray ankle negative for any acute abnormality.  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  Required in and out catheterization with removal of 1500 cc of urine  Physical Exam: Vitals:   07/12/22 0500 07/12/22 0701  07/12/22 0931  07/12/22 1610  BP:  (!) 131/93 (!) 143/56 (!) 114/56  Pulse:  72 66 84  Resp:  18 18 18   Temp:  97.9 F (36.6 C) (!) 97.5 F (36.4 C) 98.1 F (36.7 C)  TempSrc:  Oral Oral Oral  SpO2:   96% 98%  Weight: 86.1 kg     Height:       General: sitting in up bed, no distress, skin appears pale Cardiovascular: S1 and S2 Present, aortic systolic Murmur, Respiratory: good respiratory effort, Bilateral Air entry present and CTA, no Crackles, no wheezes Abdomen: Bowel Sound present, Non tender Extremities: Left foot is wrapped in dressing.  He has 1-2+ edema bilaterally Neurology: alert and oriented to Self, Place and time.   Data Reviewed: I have Reviewed nursing notes, Vitals, and Lab results since pt's last encounter. Pertinent lab results BMP I have ordered test including CBC and BMP    Family Communication: discussed with son at the bedside 9/15  Disposition: Status is: Inpatient Remains inpatient appropriate because: IV Lasix today, hopefully home in the next 24 hours  Author: Kathie Dike, MD 07/12/2022 7:34 PM  Please look on [email or web address] to find out who is on call.

## 2022-07-12 NOTE — Plan of Care (Signed)
  Problem: Fluid Volume: Goal: Hemodynamic stability will improve Outcome: Progressing   Problem: Clinical Measurements: Goal: Diagnostic test results will improve Outcome: Progressing Goal: Signs and symptoms of infection will decrease Outcome: Progressing   Problem: Respiratory: Goal: Ability to maintain adequate ventilation will improve Outcome: Progressing   Problem: Education: Goal: Knowledge of General Education information will improve Description: Including pain rating scale, medication(s)/side effects and non-pharmacologic comfort measures Outcome: Progressing   Problem: Health Behavior/Discharge Planning: Goal: Ability to manage health-related needs will improve Outcome: Progressing   Problem: Clinical Measurements: Goal: Ability to maintain clinical measurements within normal limits will improve Outcome: Progressing Goal: Will remain free from infection Outcome: Progressing Goal: Diagnostic test results will improve Outcome: Progressing Goal: Respiratory complications will improve Outcome: Progressing Goal: Cardiovascular complication will be avoided Outcome: Progressing   Problem: Activity: Goal: Risk for activity intolerance will decrease Outcome: Progressing   Problem: Nutrition: Goal: Adequate nutrition will be maintained Outcome: Progressing   Problem: Coping: Goal: Level of anxiety will decrease Outcome: Progressing   Problem: Elimination: Goal: Will not experience complications related to bowel motility Outcome: Progressing Goal: Will not experience complications related to urinary retention Outcome: Progressing   Problem: Pain Managment: Goal: General experience of comfort will improve Outcome: Progressing   Problem: Safety: Goal: Ability to remain free from injury will improve Outcome: Progressing   Problem: Skin Integrity: Goal: Risk for impaired skin integrity will decrease Outcome: Progressing   Problem: Education: Goal:  Understanding of CV disease, CV risk reduction, and recovery process will improve Outcome: Progressing Goal: Individualized Educational Video(s) Outcome: Progressing   Problem: Activity: Goal: Ability to return to baseline activity level will improve Outcome: Progressing   Problem: Cardiovascular: Goal: Ability to achieve and maintain adequate cardiovascular perfusion will improve Outcome: Progressing Goal: Vascular access site(s) Level 0-1 will be maintained Outcome: Progressing   Problem: Health Behavior/Discharge Planning: Goal: Ability to safely manage health-related needs after discharge will improve Outcome: Progressing

## 2022-07-12 NOTE — Progress Notes (Signed)
    Subjective: 3 Days Post-Op Procedure(s) (LRB): PARTIAL LEFT FIFTH RAY AMPUTATION (Left) DOS: 07/09/2022 Patient states that he has no pain associated to his left foot.  In regards to the surgical site of the left foot no complaints at this time  Objective: Skin incision and amputation site continues to be well coapted.  Sutures intact.  Minimal and improved sanguinous drainage.  No appreciable edema or erythema.  Overall well-healing surgical amputation site  Recent Labs    07/10/22 0223 07/11/22 1032  HGB 9.3* 8.3*   Recent Labs    07/10/22 0223 07/11/22 1032  WBC 8.0 7.3  RBC 2.97* 2.62*  HCT 28.1* 24.6*  PLT 100* 100*   Recent Labs    07/10/22 0223 07/11/22 1032  NA 137 137  K 5.0 3.8  CL 107 108  CO2 23 22  BUN 19 26*  CREATININE 1.45* 1.50*  GLUCOSE 243* 194*  CALCIUM 8.2* 8.4*      Assessment/Plan: 3 Days Post-Op Procedure(s) (LRB): PARTIAL LEFT FIFTH RAY AMPUTATION (Left) -Dressings changed today.  Leave clean dry and intact -Continue weightbearing as tolerated transition purposes only -If patient remains in the hospital throughout this week we will change the dressings later this week.  Otherwise podiatry will sign off leave dressings clean dry and intact until follow-up within 1 week postdischarge   Edrick Kins 07/12/2022, 8:57 AM

## 2022-07-13 ENCOUNTER — Encounter (HOSPITAL_COMMUNITY): Payer: Self-pay | Admitting: Podiatry

## 2022-07-13 DIAGNOSIS — E782 Mixed hyperlipidemia: Secondary | ICD-10-CM

## 2022-07-13 DIAGNOSIS — E871 Hypo-osmolality and hyponatremia: Secondary | ICD-10-CM | POA: Diagnosis not present

## 2022-07-13 DIAGNOSIS — I5042 Chronic combined systolic (congestive) and diastolic (congestive) heart failure: Secondary | ICD-10-CM

## 2022-07-13 DIAGNOSIS — A419 Sepsis, unspecified organism: Secondary | ICD-10-CM | POA: Diagnosis not present

## 2022-07-13 DIAGNOSIS — N179 Acute kidney failure, unspecified: Secondary | ICD-10-CM | POA: Diagnosis not present

## 2022-07-13 LAB — BASIC METABOLIC PANEL
Anion gap: 8 (ref 5–15)
BUN: 21 mg/dL (ref 8–23)
CO2: 28 mmol/L (ref 22–32)
Calcium: 8.5 mg/dL — ABNORMAL LOW (ref 8.9–10.3)
Chloride: 102 mmol/L (ref 98–111)
Creatinine, Ser: 1.38 mg/dL — ABNORMAL HIGH (ref 0.61–1.24)
GFR, Estimated: 51 mL/min — ABNORMAL LOW (ref >60)
Glucose, Bld: 127 mg/dL — ABNORMAL HIGH (ref 70–99)
Potassium: 3.7 mmol/L (ref 3.5–5.1)
Sodium: 138 mmol/L (ref 135–145)

## 2022-07-13 LAB — CBC
HCT: 26.4 % — ABNORMAL LOW (ref 39.0–52.0)
Hemoglobin: 8.9 g/dL — ABNORMAL LOW (ref 13.0–17.0)
MCH: 31.2 pg (ref 26.0–34.0)
MCHC: 33.7 g/dL (ref 30.0–36.0)
MCV: 92.6 fL (ref 80.0–100.0)
Platelets: 100 10*3/uL — ABNORMAL LOW (ref 150–400)
RBC: 2.85 MIL/uL — ABNORMAL LOW (ref 4.22–5.81)
RDW: 16.5 % — ABNORMAL HIGH (ref 11.5–15.5)
WBC: 6 10*3/uL (ref 4.0–10.5)
nRBC: 0 % (ref 0.0–0.2)

## 2022-07-13 LAB — GLUCOSE, CAPILLARY
Glucose-Capillary: 126 mg/dL — ABNORMAL HIGH (ref 70–99)
Glucose-Capillary: 131 mg/dL — ABNORMAL HIGH (ref 70–99)

## 2022-07-13 MED ORDER — CEFADROXIL 500 MG PO CAPS: 1000.0000 mg | ORAL_CAPSULE | Freq: Two times a day (BID) | ORAL | 0 refills | Status: DC

## 2022-07-13 MED ORDER — HYDRALAZINE HCL 50 MG PO TABS: 50.0000 mg | ORAL_TABLET | Freq: Three times a day (TID) | ORAL | 1 refills | Status: DC

## 2022-07-13 MED ORDER — ATORVASTATIN CALCIUM 40 MG PO TABS: 40.0000 mg | ORAL_TABLET | Freq: Every day | ORAL | 1 refills | Status: DC

## 2022-07-13 MED ORDER — DOXYCYCLINE HYCLATE 100 MG PO TABS: 100.0000 mg | ORAL_TABLET | Freq: Two times a day (BID) | ORAL | 0 refills | Status: DC

## 2022-07-13 MED ORDER — ISOSORBIDE MONONITRATE ER 30 MG PO TB24: 30.0000 mg | ORAL_TABLET | Freq: Every day | ORAL | 0 refills | Status: AC

## 2022-07-13 MED ORDER — EZETIMIBE 10 MG PO TABS: 10.0000 mg | ORAL_TABLET | Freq: Every day | ORAL | 3 refills | Status: DC

## 2022-07-13 MED ORDER — ASPIRIN 81 MG PO TBEC: 81.0000 mg | DELAYED_RELEASE_TABLET | Freq: Every day | ORAL | 12 refills | Status: DC

## 2022-07-13 MED ORDER — POLYETHYLENE GLYCOL 3350 17 G PO PACK: 17.0000 g | PACK | Freq: Every day | ORAL | 0 refills | Status: DC | PRN

## 2022-07-13 NOTE — Progress Notes (Signed)
Mobility Specialist Progress Note:   07/13/22 1430  Mobility  Activity Ambulated with assistance to bathroom  Level of Assistance Modified independent, requires aide device or extra time  Distance Ambulated (ft) 20 ft  Activity Response Tolerated well  $Mobility charge 1 Mobility   Pt in bed needing to use bathroom. No complaints of pain. Left in bed with call bell in reach and all needs met.   [name] Surveyor, mining Chat only

## 2022-07-13 NOTE — Discharge Summary (Signed)
Physician Discharge Summary  Steve [name]. IBB:[id number] DOB: Feb 27, 1941 DOA: 06/25/2022  PCP: System, Provider Not In  Admit date: 06/25/2022 Discharge date: 07/13/2022  Admitted From: home Disposition:  home  Recommendations for Outpatient Follow-up:  Follow up with PCP in 1-2 weeks Please obtain BMP/CBC in one week Voiding trial scheduled with urology on 9/29 Follow up with vascular surgery in 2 weeks Follow up with podiatry in 1 week Follow up with cardiology as scheduled  Home Health:HH PT OT RN Equipment/Devices:foley catheter  Discharge Condition:stable CODE STATUS:full code Diet recommendation: heart healthy, carb modified  Brief/Interim Summary: 81 y/o male, admitted to the hospital with sepsis secondary to left foot osteomyelitis. This was complicated by AKI on CKD. He was also noted to have significant PAD. Seen by Vascular surgery and initially underwent left common femoral-peroneal artery bypass. Subsequently seen by podiatry and underwent partial left fifth toe amputation. Hospital course complicated by development of significant urinary retention. Urology saw patient and placed foley catheter. He failed voiding trial and had catheter replaced prior to discharge with plans to follow up with urology for repeat voiding trial  Discharge Diagnoses:  Principal Problem:   Sepsis due to undetermined organism [facility]) Active Problems:   Hypertension   PVD (peripheral vascular disease) ([place])   Hyperlipidemia   PAF (paroxysmal atrial fibrillation) ([facility])   Type II diabetes mellitus ([place])   Acute kidney injury superimposed on chronic kidney disease ([place])   Dehydration with hyponatremia   Prolonged QT interval   Chronic combined systolic and diastolic CHF (congestive heart failure) ([place])  Sepsis secondary to cellulitis, present on admission 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  Associated with cellulitis. Osteomyelitis of the fifth metatarsal  head/neck in addition to early osteomyelitis of the fifth digit proximal phalanx on MRI from 9/3. Blood and urine cultures are no growth.  Empiric Vancomycin, Cefepime and Flagyl initiated on admission and patient has been deescalated to Cefepime.   As of 9/6 now on cefazolin. -Status post left partial fifth ray amputation -transition antibiotics to po -follow up with podiatry in 1 week for dressing change -use post op shoe -WBAT for transfers left leg     AKI on CKD stage IIIa Patient's baseline creatinine appears to be around 1.2. Creatinine of 2.47 on admission. Treated with IV fluids. Mild worsening after angiography.  Currently creatinine down to 1.35 Monitor.   Hyponatremia Sodium of 126 on admission. Likely related to dehydration.  Resolved    Acute urinary retention Unsure of etiology.  Patient appears to be asymptomatic.  Urinalysis is not suggestive of infection. Patient with continued urinary retention -Foley catheter inserted on 9/3.   -Catheter removed on 9/16 and patient reports that he is able to pass urine. -Unfortunately, developed urinary retention again 9/18 -We will replace Foley catheter.  Will need to continue on discharge until he can see urology -Outpatient urology follow-up   Constipation Received enema overnight Continue on MiraLAX Having bowel movements at the time of discharge   Diabetes mellitus type 2 uncontrolled with hyperglycemia with nonhealing wounds. Patient is on Januvia, Levemir and metformin as an outpatient. Most recent hemoglobin A1C is 7.9%. -Continue Levemir and SSI Blood sugar stable   CAD Hyperlipidemia PAD No chest pain. Stable. Continue Zetia, on Repatha outpatient. Seen by vascular surgery ABI significantly abnormal.  Angiography shows evidence of significant stenotic disease. S/p left common femoral-peroneal artery bypass Follow up with vascular in 1-2 weeks for wound check   Primary hypertension -  Continue Imdur and  Toprol XL   Paroxysmal atrial fibrillation -Heart rate stable on Toprol -Anticoagulated with Eliquis   Thrombocytopenia No current bleeding. stable.   Acute encephalopathy. Mostly delirium Etiology not clear  most likely combination of prolonged hospitalization and medication side effect.  Unable to differentiate further whether metabolic or toxic. -Now resolved.    Prolonged QTc. QTc is 501. Monitor for now.  Avoid QT prolonging medications.     Anemia -likely has some component of acute blood loss, may also have an element of chronic disease -transfused 2 units prbc 9/13 during surgery -Follow-up hemoglobin has been stable at 8.9   Chronic combined CHF -EF 35-40% with grade 2 DD -currently euvolemic  Discharge Instructions  Discharge Instructions     Diet - low sodium heart healthy   Complete by: As directed    Discharge wound care:   Complete by: As directed    Keep dressings clean, dry and intact   Increase activity slowly   Complete by: As directed       Allergies as of 07/13/2022       Reactions   Januvia [sitagliptin] Other (See Comments)   Soreness to stomach area. Intolerance    Statins Other (See Comments)   Myalgias, muscle weakness, swelling, pain        Medication List     STOP taking these medications    cyclobenzaprine 10 MG tablet Commonly known as: FLEXERIL   gabapentin 100 MG capsule Commonly known as: NEURONTIN   Januvia 50 MG tablet Generic drug: sitaGLIPtin   silver sulfADIAZINE 1 % cream Commonly known as: Silvadene       TAKE these medications    acetaminophen 500 MG tablet Commonly known as: TYLENOL Take 500 mg by mouth every 6 (six) hours as needed for moderate pain.   aspirin EC 81 MG tablet Take 1 tablet (81 mg total) by mouth daily. Swallow whole.   atorvastatin 40 MG tablet Commonly known as: LIPITOR Take 1 tablet (40 mg total) by mouth daily.   blood glucose meter kit and supplies Dispense based on  patient and insurance preference. Use up to four times daily as directed. (FOR ICD-10 E10.9, E11.9).   cefadroxil 500 MG capsule Commonly known as: DURICEF Take 2 capsules (1,000 mg total) by mouth 2 (two) times daily for 7 days.   cyanocobalamin 500 MCG tablet Commonly known as: VITAMIN B12 Take 500 mcg by mouth daily with breakfast.   doxazosin 2 MG tablet Commonly known as: CARDURA Take 2 mg by mouth daily.   doxycycline 100 MG tablet Commonly known as: VIBRA-TABS Take 1 tablet (100 mg total) by mouth 2 (two) times daily for 7 days.   Eliquis 2.5 MG Tabs tablet Generic drug: apixaban Take 2.5 mg by mouth 2 (two) times daily.   ezetimibe 10 MG tablet Commonly known as: ZETIA Take 1 tablet (10 mg total) by mouth daily.   hydrALAZINE 50 MG tablet Commonly known as: APRESOLINE Take 1 tablet (50 mg total) by mouth every 8 (eight) hours.   insulin detemir 100 UNIT/ML injection Commonly known as: LEVEMIR Inject 0.18 mLs (18 Units total) into the skin daily. What changed:  how much to take when to take this   INSULIN SYRINGE .3CC/31GX5/16" 31G X 5/16" 0.3 ML Misc Use with insulin   isosorbide mononitrate 30 MG 24 hr tablet Commonly known as: IMDUR Take 1 tablet (30 mg total) by mouth daily. What changed: See the new instructions.   metFORMIN 1000  MG tablet Commonly known as: GLUCOPHAGE Take 1,000 mg by mouth daily.   metoprolol succinate 100 MG 24 hr tablet Commonly known as: TOPROL-XL Take 1 tablet (100 mg total) by mouth daily. Take with or immediately following a meal. What changed: when to take this   polyethylene glycol 17 g packet Commonly known as: MIRALAX / GLYCOLAX Take 17 g by mouth daily as needed for mild constipation.   Vitamin D3 50 MCG (2000 UT) capsule Take 2,000 Units by mouth daily.               Discharge Care Instructions  (From admission, onward)           Start     Ordered   07/13/22 0000  Discharge wound care:        Comments: Keep dressings clean, dry and intact   07/13/22 0917            Follow-up Information     [place], [place] Follow up.   Specialty: Home Health Services Why: Someone will call you to schedule first home visit. Contact information: [place] [address] [phone]         Cherre Robins, MD. Schedule an appointment as soon as possible for a visit in 2 week(s).   Specialties: Vascular Surgery, Interventional Cardiology Contact information: [place] [address] [phone]         Edrick Kins, DPM. Schedule an appointment as soon as possible for a visit in 1 week(s).   Specialty: Podiatry Contact information: [address] [place] [address] [place] [address] [phone]                Allergies  Allergen Reactions   Januvia [Sitagliptin] Other (See Comments)    Soreness to stomach area. Intolerance    Statins Other (See Comments)    Myalgias, muscle weakness, swelling, pain    Consultations: Vascular surgery Podiatry Urology cardiology   Procedures/Studies: DG Foot Complete Left  Result Date: 07/09/2022 CLINICAL DATA:  Postop amputation fifth digit EXAM: LEFT FOOT - COMPLETE 3+ VIEW COMPARISON:  06/26/2022 FINDINGS: Frontal, oblique, and lateral views of the left foot are obtained. 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 Postsurgical changes are seen within the overlying soft tissues. No other acute bony abnormalities. IMPRESSION: 1. Postoperative changes from amputation at the level of the mid fifth metatarsal. Electronically Signed   By: Randa Ngo M.D.   On: 07/09/2022 19:46   ECHOCARDIOGRAM COMPLETE  Result Date: 07/05/2022    ECHOCARDIOGRAM REPORT   Patient Name:   Traeger Sultana. Date of Exam: 07/05/2022 Medical Rec #:  [id number]        Height:       69.0 in Accession #:    [id number]       Weight:       193.6 lb Date of Birth:   04/19/1941         BSA:          2.037 m Patient Age:    28 years         BP:           142/50 mmHg Patient Gender: M                HR:           60 bpm. Exam Location:  Inpatient Procedure: 2D Echo, Cardiac Doppler and Color  Doppler Indications:    Preoperative evaluation  History:        Patient has prior history of Echocardiogram examinations, most                 recent 12/15/2018. Cardiomyopathy, CAD, Prior CABG,                 Arrythmias:RBBB and Atrial Fibrillation; Risk Factors:Diabetes,                 Dyslipidemia and Hypertension.  Sonographer:    Eartha Inch Referring Phys: Reino Bellis, B  Sonographer Comments: Image acquisition challenging due to patient body habitus. IMPRESSIONS  1. Left ventricular ejection fraction, by estimation, is 35 to 40%. The left ventricle has moderately decreased function. The left ventricle demonstrates global hypokinesis. The left ventricular internal cavity size was mildly dilated. Left ventricular diastolic parameters are consistent with Grade II diastolic dysfunction (pseudonormalization). Elevated left atrial pressure.  2. Right ventricular systolic function is normal. The right ventricular size is normal. There is normal pulmonary artery systolic pressure. The estimated right ventricular systolic pressure is 97.3 mmHg.  3. Left atrial size was moderately dilated.  4. Secondary ventricular functional mitral regurgitation with regurgitant volume 49 mL. Systolic blunting of the right sided pulmonary vein systolic flow. The mitral valve is normal in structure. Moderate mitral valve regurgitation. No evidence of mitral stenosis.  5. The aortic valve is tricuspid. Aortic valve regurgitation is not visualized. No aortic stenosis is present. Comparison(s): Prior images reviewed side by side. Slight increase in mitral regurgitation. FINDINGS  Left Ventricle: Left ventricular ejection fraction, by estimation, is 35 to 40%. The left ventricle has moderately decreased  function. The left ventricle demonstrates global hypokinesis. The left ventricular internal cavity size was mildly dilated. There is no left ventricular hypertrophy. Left ventricular diastolic parameters are consistent with Grade II diastolic dysfunction (pseudonormalization). Elevated left atrial pressure. Right Ventricle: The right ventricular size is normal. No increase in right ventricular wall thickness. Right ventricular systolic function is normal. There is normal pulmonary artery systolic pressure. The tricuspid regurgitant velocity is 2.23 m/s, and  with an assumed right atrial pressure of 3 mmHg, the estimated right ventricular systolic pressure is 53.2 mmHg. Left Atrium: Left atrial size was moderately dilated. Right Atrium: Right atrial size was normal in size. Pericardium: There is no evidence of pericardial effusion. Mitral Valve: Secondary ventricular functional mitral regurgitation with regurgitant volume 49 mL. Systolic blunting of the right sided pulmonary vein systolic flow. The mitral valve is normal in structure. Moderate mitral valve regurgitation. No evidence of mitral valve stenosis. Tricuspid Valve: The tricuspid valve is normal in structure. Tricuspid valve regurgitation is mild . No evidence of tricuspid stenosis. Aortic Valve: The aortic valve is tricuspid. Aortic valve regurgitation is not visualized. No aortic stenosis is present. Pulmonic Valve: The pulmonic valve was normal in structure. Pulmonic valve regurgitation is mild. No evidence of pulmonic stenosis. Aorta: The aortic root and ascending aorta are structurally normal, with no evidence of dilitation. IAS/Shunts: No atrial level shunt detected by color flow Doppler.  LEFT VENTRICLE PLAX 2D LVIDd:         6.00 cm      Diastology LVIDs:         4.90 cm      LV e' medial:    5.22 cm/s LV PW:         1.00 cm      LV E/e' medial:  21.6 LV IVS:  1.00 cm      LV e' lateral:   3.57 cm/s LVOT diam:     2.40 cm      LV E/e' lateral:  31.7 LV SV:         124 LV SV Index:   61 LVOT Area:     4.52 cm  LV Volumes (MOD) LV vol d, MOD A2C: 155.0 ml LV vol d, MOD A4C: 168.0 ml LV vol s, MOD A2C: 96.0 ml LV vol s, MOD A4C: 111.0 ml LV SV MOD A2C:     59.0 ml LV SV MOD A4C:     168.0 ml LV SV MOD BP:      58.5 ml RIGHT VENTRICLE RV S prime:     9.48 cm/s TAPSE (M-mode): 1.6 cm LEFT ATRIUM             Index        RIGHT ATRIUM           Index LA diam:        5.30 cm 2.60 cm/m   RA Area:     13.50 cm LA Vol (A2C):   77.5 ml 38.04 ml/m  RA Volume:   29.10 ml  14.28 ml/m LA Vol (A4C):   82.4 ml 40.44 ml/m LA Biplane Vol: 80.6 ml 39.56 ml/m  AORTIC VALVE LVOT Vmax:   105.00 cm/s LVOT Vmean:  76.100 cm/s LVOT VTI:    0.275 m  AORTA Ao Root diam: 3.30 cm Ao Asc diam:  3.90 cm MITRAL VALVE                  TRICUSPID VALVE MV Area (PHT): 3.50 cm       TR Peak grad:   19.9 mmHg MV Decel Time: 217 msec       TR Vmax:        223.00 cm/s MR Peak grad:    110.2 mmHg MR Mean grad:    74.0 mmHg    SHUNTS MR Vmax:         525.00 cm/s  Systemic VTI:  0.28 m MR Vmean:        402.0 cm/s   Systemic Diam: 2.40 cm MR PISA:         3.08 cm MR PISA Eff ROA: 22 mm MR PISA Radius:  0.70 cm MV E velocity: 113.00 cm/s MV A velocity: 84.60 cm/s MV E/A ratio:  1.34 Rudean Haskell MD Electronically signed by Rudean Haskell MD Signature Date/Time: 07/05/2022/2:35:17 PM    Final    DG Ankle Right Port  Result Date: 07/04/2022 CLINICAL DATA:  Right ankle pain EXAM: PORTABLE RIGHT ANKLE - 2 VIEW COMPARISON:  12/01/2021 FINDINGS: There is no evidence of fracture, dislocation, or joint effusion. There is no evidence of arthropathy or other focal bone abnormality. No focal soft tissue swelling. Advanced atherosclerotic vascular calcifications. IMPRESSION: Negative. Electronically Signed   By: Davina Poke D.O.   On: 07/04/2022 13:08   VAS [place] LOWER EXTREMITY SAPHENOUS VEIN MAPPING  Result Date: 07/03/2022 LOWER EXTREMITY VEIN MAPPING Patient Name:  Jadarian Mckay.  Date of Exam:   07/03/2022 Medical Rec #: [id number]         Accession #:    [id number] Date of Birth: 1941-04-08          Patient Gender: M Patient Age:   65 years Exam Location:  [facility] Procedure:      VAS [place] LOWER EXTREMITY [place] Referring  Phys: Jamelle Haring --------------------------------------------------------------------------------  Indications:  Pre operative Risk Factors: PAD.  Comparison Study: No prior Performing Technologist: Sharion Dove RVS  Examination Guidelines: A complete evaluation includes B-mode imaging, spectral Doppler, color Doppler, and power Doppler as needed of all accessible portions of each vessel. Bilateral testing is considered an integral part of a complete examination. Limited examinations for reoccurring indications may be performed as noted. +---------------+-----------+----------------------+---------------+-----------+   RT Diameter  RT Findings         GSV            LT Diameter  LT Findings      (cm)                                            (cm)                  +---------------+-----------+----------------------+---------------+-----------+      0.48                     Saphenofemoral         0.39                                                   Junction                                  +---------------+-----------+----------------------+---------------+-----------+      0.49                     Proximal thigh         0.18                  +---------------+-----------+----------------------+---------------+-----------+      0.46                       Mid thigh          0.21/0.18    branching  +---------------+-----------+----------------------+---------------+-----------+      0.44                      Distal thigh          0.19                  +---------------+-----------+----------------------+---------------+-----------+      0.40                          Knee              0.19                   +---------------+-----------+----------------------+---------------+-----------+      0.36                       Prox calf            0.18                  +---------------+-----------+----------------------+---------------+-----------+      0.36  Mid calf            0.20                  +---------------+-----------+----------------------+---------------+-----------+      0.29                      Distal calf           0.28                  +---------------+-----------+----------------------+---------------+-----------+      0.26                         Ankle              0.20       branching  +---------------+-----------+----------------------+---------------+-----------+ Diagnosing physician: Orlie Pollen Electronically signed by Orlie Pollen on 07/03/2022 at 10:43:33 AM.    Final    PERIPHERAL VASCULAR CATHETERIZATION  Result Date: 07/02/2022 PATIENT:  Steve [name].  81 y.o. male  PRE-OPERATIVE DIAGNOSIS:  Atherosclerosis of native arteries of left lower extremity causing ulceration  POST-OPERATIVE DIAGNOSIS:  Same  PROCEDURE:  1) Ultrasound guided right common femoral artery access 2) Aortogram 3) Left lower extremity angiogram with second order cannulation (32m total contrast) 4) Conscious sedation (24 minutes)  SURGEON:  TYevonne Aline HStanford Breed MD  ASSISTANT: none  ANESTHESIA:   local and IV sedation  ESTIMATED BLOOD LOSS: minimal  LOCAL MEDICATIONS USED:  LIDOCAINE  COUNTS: confirmed correct.  PATIENT DISPOSITION:  PACU - hemodynamically stable.  Delay start of Pharmacological VTE agent (>24hrs) due to surgical blood loss or risk of bleeding: no  INDICATION FOR PROCEDURE: CAlben Jepsen is a 81y.o. male with left foot ischemic ulceration. After careful discussion of risks, benefits, and alternatives the patient was offered angiography. The patient understood and wished to proceed.  OPERATIVE FINDINGS: Terminal aorta and iliac arteries: Tortuous, heavily  calcified. No flow limiting stenosis.  Left lower extremity: Common femoral artery: Heavily calcified. Moderate distal stenosis prior to bifurcation (~50%). Profunda femoris artery: patent without stenosis Superficial femoral artery: heavily calcified. Occludes at Hunter's canal. Popliteal artery: Occluded. A small island of below knee popliteal artery reconstitutes and then occludes. Anterior tibial artery: occluded Tibioperoneal trunk: occluded Peroneal artery: reconstitutes in proximal calf. Flows to ankle and gives off collateralization to fill the foot Posterior tibial artery: occluded Pedal circulation: disadvantaged.  GLASS score. FP: 4. IP: 3. Stage III disease.  WIfI score. 1 / 3 / 0. Moderate amputation risk. High benefit from revascularization.  DESCRIPTION OF PROCEDURE: After identification of the patient in the pre-operative holding area, the patient was transferred to the operating room. The patient was positioned supine on the operating room table. Anesthesia was induced. The groins was prepped and draped in standard fashion. A surgical pause was performed confirming correct patient, procedure, and operative location.  The right groin was anesthetized with subcutaneous injection of 1% lidocaine. Using ultrasound guidance, the right common femoral artery was accessed with micropuncture technique. Fluoroscopy was used to confirm cannulation over the femoral head. The 512F sheath was upsized to 12F.  A Benson wire was advanced into the distal aorta. Over the wire an omni flush catheter was advanced to the level of L2. Aortogram was performed - see above for details.  The left common iliac artery was selected with an omniflush catheter and glidewire guidewire. The wire was advanced into the common femoral artery.  Over the wire the omni flush catheter was advanced into the external iliac artery. Selective angiography was performed - see above for details.  The sheath was left in place to be removed in the  holding area.  Conscious sedation was administered with the use of IV fentanyl and midazolam under continuous physician and nurse monitoring.  Heart rate, blood pressure, and oxygen saturation were continuously monitored.  Total sedation time was 24 minutes  Upon completion of the case instrument and sharps counts were confirmed correct. The patient was transferred to the PACU in good condition. I was present for all portions of the procedure.  PLAN: ASA 54m PO QD. High intensity statin therapy. Resume anticoagulation 8 hours post sheath pull. Only surgical option is common femoral to peroneal artery bypass. He may not tolerate this given his age and comorbidity. Will discuss with Dr. DScot Dock Will check vein mapping.  TYevonne Aline HStanford Breed MD [facility]neyPhone Number: [phone]/05/2022 12:29 PM   VAS [place] WITH/WO TBI  Result Date: 06/30/2022  LOWER EXTREMITY DOPPLER STUDY Patient Name:  Jorryn C Demetriou JR.  Date of Exam:   06/30/2022 Medical Rec #: [id number]        Accession #:    [id number] of Birth: 523-Jul-1942         Patient Gender: M Patient Age:   89years [facility] [place] WITH/WO TBI Referring Phys: RALPH NETTEY --------------------------------------------------------------------------------  Indications: Peripheral artery disease. High Risk Factors: Hypertension, hyperlipidemia, coronary artery disease.  Comparison Study: 01/07/2022 ABI/TBI- Right= [place]/0.30, left=0.57/0.20 Performing Technologist: SMaudry MayhewMHA, RVT, RDCS, RDMS  Examination Guidelines: A complete evaluation includes at minimum, Doppler waveform signals and systolic blood pressure reading at the level of bilateral brachial, anterior tibial, and posterior tibial arteries, when vessel segments are accessible. Bilateral testing is considered an integral part of a complete examination. Photoelectric Plethysmograph (PPG) waveforms and toe systolic  pressure readings are included as required and additional duplex testing as needed. Limited examinations for reoccurring indications may be performed as noted.  ABI Findings: +---------+------------------+-----+----------+--------+ Right    Rt Pressure (mmHg)IndexWaveform  Comment  +---------+------------------+-----+----------+--------+ Brachial 144                    triphasic          +---------+------------------+-----+----------+--------+ PTA      255               1.31 monophasic         +---------+------------------+-----+----------+--------+ DP       255               1.31 monophasic         +---------+------------------+-----+----------+--------+ Great Toe58                0.30                    +---------+------------------+-----+----------+--------+ +---------+------------------+-----+----------+-------+ Left     Lt Pressure (mmHg)IndexWaveform  Comment +---------+------------------+-----+----------+-------+ Brachial 195                                      +---------+------------------+-----+----------+-------+ PTA      43                0.22 monophasic        +---------+------------------+-----+----------+-------+  DP       74                0.38 monophasic        +---------+------------------+-----+----------+-------+ Great Toe19                0.10                   +---------+------------------+-----+----------+-------+ +-------+-----------+-----------+------------+------------+ ABI/TBIToday's ABIToday's TBIPrevious ABIPrevious TBI +-------+-----------+-----------+------------+------------+ Right  [place]         0.30       [place]          0.30         +-------+-----------+-----------+------------+------------+ Left   0.38       0.10       0.57        0.20         +-------+-----------+-----------+------------+------------+  Summary: Right: Resting right ankle-brachial index indicates noncompressible right lower extremity arteries.  The right toe-brachial index is abnormal. Left: Resting left ankle-brachial index indicates severe left lower extremity arterial disease. The left toe-brachial index is abnormal. *See table(s) above for measurements and observations.  Electronically signed by Harold Barban MD on 06/30/2022 at 10:31:11 PM.    Final    MR FOOT LEFT WO CONTRAST  Result Date: 06/27/2022 CLINICAL DATA:  Foot swelling, diabetic, osteomyelitis suspected, xray done EXAM: MRI OF THE LEFT FOOT WITHOUT CONTRAST TECHNIQUE: Multiplanar, multisequence MR imaging of the left forefoot was performed. No intravenous contrast was administered. COMPARISON:  Left radiograph 06/26/2022 FINDINGS: Bones/Joint/Cartilage There is marrow edema and low T1 signal within the distal fifth metatarsal head/neck. There is marrow edema and preserved T1 signal in the fifth toe proximal phalanx. Moderate first MTP osteoarthritis. Additional scattered marrow edema which is likely reactive. Ligaments Intact Lisfranc ligament. Muscles and Tendons Diffuse intramuscular edema and atrophy in the foot as is commonly seen in diabetics. Soft tissues There is a lateral forefoot soft tissue ulcer adjacent to the fifth metatarsal head. There is no well-defined/drainable fluid collection. There is mild generalized soft tissue swelling of the foot. 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 No evidence of soft tissue abscess. Electronically Signed   By: Maurine Simmering M.D.   On: 06/27/2022 14:06   DG Foot Complete Left  Result Date: 06/26/2022 CLINICAL DATA:  Diabetic foot ulcer. EXAM: LEFT FOOT - COMPLETE 3+ VIEW COMPARISON:  None Available. FINDINGS: No fracture. No bone lesion. There is no bone resorption to suggest osteomyelitis. Joints are normally aligned. Soft tissue air projects along the lateral plantar aspect of the forefoot adjacent to the fifth  metatarsal head consistent with a diabetic ulcer. IMPRESSION: 1. No fracture.  No evidence of osteomyelitis. Electronically Signed   By: Lajean Manes M.D.   On: 06/26/2022 12:41      Subjective: Feeling well, no complaints  Discharge Exam: Vitals:   07/12/22 1957 07/13/22 0017 07/13/22 0616 07/13/22 0800  BP: (!) 149/49 (!) 145/53 (!) 155/57 (!) 138/48  Pulse: 77 73 75 79  Resp: _0 Temp: 98 F (36.7 C) 98.1 F (36.7 C) 97.9 F (36.6 C) 98.4 F (36.9 C)  TempSrc: Oral Oral Oral Oral  SpO2: 98% 97% 97% 98%  Weight:   85.1 kg   Height:        General: Pt is alert, awake, not in acute distress Cardiovascular: RRR, S1/S2 +, no rubs, no  gallops Respiratory: CTA bilaterally, no wheezing, no rhonchi Abdominal: Soft, NT, ND, bowel sounds + Extremities: left foot is dressed    The results of significant diagnostics from this hospitalization (including imaging, microbiology, ancillary and laboratory) are listed below for reference.     Microbiology: Recent Results (from the past 240 hour(s))  Surgical pcr screen     Status: None   Collection Time: 07/07/22  6:55 AM   Specimen: Nasal Mucosa; Nasal Swab  Result Value Ref Range Status   MRSA, PCR NEGATIVE NEGATIVE Final   Staphylococcus aureus NEGATIVE NEGATIVE Final    Comment: (NOTE) The Xpert SA Assay (FDA approved for NASAL specimens in patients 37 years of age and older), is one component of a comprehensive surveillance program. It is not intended to diagnose infection nor to guide or monitor treatment. Performed at [facility], [place] [address], [place], [place] [address]      Labs: BNP (last 3 results) No results for input(s): "BNP" in the last 8760 hours. Basic Metabolic Panel: Recent Labs  Lab 07/07/22 0413 07/07/22 1244 07/09/22 0337 07/10/22 0223 07/11/22 1032 07/12/22 1203 07/13/22 0421  NA 139   < > 141 137 137 137 138  K 3.5   < > 4.1 5.0 3.8 3.7 3.7  CL 112*   < > 110 107 108 101  102  CO2 21*   < > _0 GLUCOSE 116*   < > 155* 243* 194* 186* 127*  BUN 9   < > 18 19 26* 24* 21  CREATININE 1.35*   < > 1.48* 1.45* 1.50* 1.35* 1.38*  CALCIUM 8.0*   < > 8.4* 8.2* 8.4* 8.7* 8.5*  MG  --   --   --   --  2.0  --   --   PHOS 3.8  --   --   --   --   --   --    < > = values in this interval not displayed.   Liver Function Tests: Recent Labs  Lab 07/07/22 0413  ALBUMIN 2.4*   No results for input(s): "LIPASE", "AMYLASE" in the last 168 hours. No results for input(s): "AMMONIA" in the last 168 hours. CBC: Recent Labs  Lab 07/09/22 0337 07/10/22 0223 07/11/22 1032 07/12/22 1203 07/13/22 0421  WBC 9.0 8.0 7.3 7.8 6.0  HGB 9.0* 9.3* 8.3* 9.1* 8.9*  HCT 26.7* 28.1* 24.6* 27.2* 26.4*  MCV 93.4 94.6 93.9 92.8 92.6  PLT 94* 100* 100* 112* 100*   Cardiac Enzymes: No results for input(s): "CKTOTAL", "CKMB", "CKMBINDEX", "TROPONINI" in the last 168 hours. BNP: Invalid input(s): "POCBNP" CBG: Recent Labs  Lab 07/12/22 0549 07/12/22 1216 07/12/22 1613 07/12/22 2106 07/13/22 0618  GLUCAP 186* 180* 159* 108* 126*   D-Dimer No results for input(s): "DDIMER" in the last 72 hours. Hgb A1c No results for input(s): "HGBA1C" in the last 72 hours. Lipid Profile No results for input(s): "CHOL", "HDL", "LDLCALC", "TRIG", "CHOLHDL", "LDLDIRECT" in the last 72 hours. Thyroid function studies No results for input(s): "TSH", "T4TOTAL", "T3FREE", "THYROIDAB" in the last 72 hours.  Invalid input(s): "FREET3" Anemia work up No results for input(s): "VITAMINB12", "FOLATE", "FERRITIN", "TIBC", "IRON", "RETICCTPCT" in the last 72 hours. Urinalysis    Component Value Date/Time   COLORURINE YELLOW 06/25/2022 0200   APPEARANCEUR HAZY (A) 06/25/2022 0200   LABSPEC 1.013 06/25/2022 0200   PHURINE 5.0 06/25/2022 0200   GLUCOSEU 50 (A) 06/25/2022 0200   HGBUR NEGATIVE 06/25/2022  0200   BILIRUBINUR NEGATIVE 06/25/2022 0200   KETONESUR NEGATIVE 06/25/2022 0200    PROTEINUR 100 (A) 06/25/2022 0200   UROBILINOGEN 1.0 04/01/2015 0900   NITRITE NEGATIVE 06/25/2022 0200   LEUKOCYTESUR NEGATIVE 06/25/2022 0200   Sepsis Labs Recent Labs  Lab 07/10/22 0223 07/11/22 1032 07/12/22 1203 07/13/22 0421  WBC 8.0 7.3 7.8 6.0   Microbiology Recent Results (from the past 240 hour(s))  Surgical pcr screen     Status: None   Collection Time: 07/07/22  6:55 AM   Specimen: Nasal Mucosa; Nasal Swab  Result Value Ref Range Status   MRSA, PCR NEGATIVE NEGATIVE Final   Staphylococcus aureus NEGATIVE NEGATIVE Final    Comment: (NOTE) The Xpert SA Assay (FDA approved for NASAL specimens in patients 41 years of age and older), is one component of a comprehensive surveillance program. It is not intended to diagnose infection nor to guide or monitor treatment. Performed at [facility], [place] [address], [place], [place] [address]      Time coordinating discharge: 62mns  SIGNED:   JKathie Dike MD  [facility] 07/13/2022, 9:20 AM   If 7PM-7AM, please contact night-coverage [email or web address]

## 2022-07-13 NOTE — Progress Notes (Signed)
Mobility Specialist Progress Note:   07/13/22 1107  Mobility  Activity Transferred from chair to bed  Level of Assistance Modified independent, requires aide device or extra time  Assistive Device None  Distance Ambulated (ft) 2 ft  Activity Response Tolerated well  $Mobility charge 1 Mobility   Pt received in chair stating the chair is uncomfortable and asking to get back to bed. Complaints of L foot soreness. Left in chair with call bell in reach and all needs met.   [facility] Surveyor, mining Chat only

## 2022-07-13 NOTE — Plan of Care (Signed)
  Problem: Health Behavior/Discharge Planning: Goal: Ability to manage health-related needs will improve Outcome: Progressing   

## 2022-07-13 NOTE — Progress Notes (Signed)
Occupational Therapy Treatment Patient Details Name: Steve [name]. MRN: [id number] DOB: 08-15-1941 Today's Date: 07/13/2022   History of present illness Pt is an 81 y.o. male admitted from [facility] on 06/25/22 with tremors, feeling poorly. Workup for AKI, hyponatremia, sepsis, L foot nonhealing wound. S/p aortogram, LLE angiogram 9/8. 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  PMH includes CAD s/p CABG, HTN, HLD, DM, PAD, afib.   OT comments  Pt progressing towards goals this session, completing chair transfer with supervision using RW, bed mobility with mod A, and LB ADLs with mod I. Reviewed precautions and need for post op shoe with mobility, pt verbalized understanding. Pt presenting with impairments listed below, will follow acutely. Continue to recommend HHOT at d/c.   Recommendations for follow up therapy are one component of a multi-disciplinary discharge planning process, led by the attending physician.  Recommendations may be updated based on patient status, additional functional criteria and insurance authorization.    Follow Up Recommendations  Home health OT    Assistance Recommended at Discharge Set up Supervision/Assistance  Patient can return home with the following  A little help with walking and/or transfers;A little help with bathing/dressing/bathroom;Direct supervision/assist for medications management;Direct supervision/assist for financial management;Help with stairs or ramp for entrance   Equipment Recommendations  None recommended by OT    Recommendations for Other Services      Precautions / Restrictions Precautions Precautions: Fall Required Braces or Orthoses: Other Brace Other Brace: post op shoe Restrictions Weight Bearing Restrictions: Yes LLE Weight Bearing: Weight bearing as tolerated Other Position/Activity Restrictions: WBAT in post op shoe for transitions only per podiatry       Mobility Bed  Mobility Overal bed mobility: Modified Independent                  Transfers Overall transfer level: Needs assistance Equipment used: Rolling walker (2 wheels) Transfers: Sit to/from Stand, Bed to chair/wheelchair/BSC Sit to Stand: Supervision Stand pivot transfers: Supervision         General transfer comment: no further mobility due mobility restrictions, transitions only per podiatry note     Balance Overall balance assessment: Needs assistance Sitting-balance support: No upper extremity supported, Feet supported Sitting balance-Leahy Scale: Good     Standing balance support: No upper extremity supported, During functional activity Standing balance-Leahy Scale: Fair Standing balance comment: can static stand without UE support for ADL grooming tasks at sink                           ADL either performed or assessed with clinical judgement   ADL Overall ADL's : Needs assistance/impaired                     Lower Body Dressing: Modified independent;Set up;Sitting/lateral leans Lower Body Dressing Details (indicate cue type and reason): donning post op shoe to LLE Toilet Transfer: Supervision/safety;Rolling walker (2 wheels);Grab bars;Stand-pivot;BSC/3in1 Toilet Transfer Details (indicate cue type and reason): simulated with bed to chair         Functional mobility during ADLs: Supervision/safety;Rolling walker (2 wheels)      Extremity/Trunk Assessment Upper Extremity Assessment Upper Extremity Assessment: Overall WFL for tasks assessed   Lower Extremity Assessment Lower Extremity Assessment: Defer to PT evaluation        Vision   Vision Assessment?: No apparent visual deficits   Perception Perception Perception: Within Functional Limits  Praxis Praxis Praxis: Intact    Cognition Arousal/Alertness: Awake/alert Behavior During Therapy: WFL for tasks assessed/performed Overall Cognitive Status: Within Functional Limits for  tasks assessed                                 General Comments: motivated with therapy        Exercises      Shoulder Instructions       General Comments VSS on RA    Pertinent Vitals/ Pain       Pain Assessment Pain Assessment: Faces Pain Score: 3  Faces Pain Scale: Hurts little more Pain Location: LLE Pain Descriptors / Indicators: Discomfort, Sore, Moaning Pain Intervention(s): Limited activity within patient's tolerance, Monitored during session, Repositioned  Home Living                                          Prior Functioning/Environment              Frequency  Min 2X/week        Progress Toward Goals  OT Goals(current goals can now be found in the care plan section)  Progress towards OT goals: Progressing toward goals  Acute Rehab OT Goals Patient Stated Goal: to get better OT Goal Formulation: With patient Time For Goal Achievement: 07/27/22 Potential to Achieve Goals: Good ADL Goals Pt Will Perform Grooming: with modified independence;standing Pt Will Perform Lower Body Bathing: with modified independence;sitting/lateral leans;sit to/from stand Pt Will Perform Lower Body Dressing: with modified independence;sitting/lateral leans;sit to/from stand Pt Will Transfer to Toilet: with modified independence;ambulating Pt Will Perform Toileting - Clothing Manipulation and hygiene: with modified independence;sitting/lateral leans;sit to/from stand  Plan Discharge plan remains appropriate    Co-evaluation                 AM-PAC OT "6 Clicks" Daily Activity     Outcome Measure   Help from another person eating meals?: None Help from another person taking care of personal grooming?: A Little Help from another person toileting, which includes using toliet, bedpan, or urinal?: A Little Help from another person bathing (including washing, rinsing, drying)?: A Little Help from another person to put on and taking  off regular upper body clothing?: None Help from another person to put on and taking off regular lower body clothing?: A Little 6 Click Score: 20    End of Session Equipment Utilized During Treatment: Rolling walker (2 wheels)  OT Visit Diagnosis: Unsteadiness on feet (R26.81);Other abnormalities of gait and mobility (R26.89);Muscle weakness (generalized) (M62.81)   Activity Tolerance Patient tolerated treatment well   Patient Left in chair;with call bell/phone within reach;with chair alarm set   Nurse Communication Mobility status        Time: 1884-1660 OT Time Calculation (min): 16 min  Charges: OT General Charges $OT Visit: 1 Visit OT Treatments $Self Care/Home Management : 8-22 mins  Lynnda Child, OTD, OTR/L Acute Rehab [phone]) [phone] - [place] 07/13/2022, 10:32 AM

## 2022-07-13 NOTE — TOC Transition Note (Signed)
Transition of Care (TOC) - CM/SW Discharge Note Marvetta Gibbons RN, BSN Transitions of Care Unit 4E- RN Case Manager See Treatment Team for direct phone #    Patient Details  Name: Steve [name]. MRN: [id number] Date of Birth: April 22, 1941  Transition of Care [facility]) CM/SW Contact:  Dawayne Patricia, RN Phone Number: 07/13/2022, 10:22 AM   Clinical Narrative:    Pt stable for transition home today, HH has been arranged with [facility] for HHRN/PT/OT needs. CM has notified Levada Dy w/ [facility] for start of care. They will contact patient post discharge to schedule.   No further TOC needs noted. Pt to return [facility] [place] [facility].    Final next level of care: [place] Barriers to Discharge: Barriers Resolved   Patient Goals and CMS Choice Patient states their goals for this hospitalization and ongoing recovery are:: To return to [place]. CMS Medicare.gov Compare Post Acute Care list provided to:: Patient Choice offered to / list presented to : Patient  Discharge Placement               Home w/ [facility]        Discharge Plan and Services   Discharge Planning Services: CM Consult Post Acute Care Choice: Home Health          DME Arranged: N/A DME Agency: NA       HH Arranged: PT, OT, RN, Disease Management [place] Agency: [place] Date [place]: 06/28/22 Time [place]: 4920 Representative spoke with at [place]: [place] ([place]) Interventions     Readmission Risk Interventions    07/13/2022   10:22 AM  Readmission Risk Prevention Plan  Transportation Screening Complete  PCP or Specialist Appt within 3-5 Days Complete  HRI or [place] Complete  Social Work Consult for [place] Planning/Counseling Complete  Palliative Care Screening Not Applicable  Medication Review Press photographer) Complete

## 2022-07-14 ENCOUNTER — Encounter (HOSPITAL_COMMUNITY): Payer: Self-pay

## 2022-07-14 ENCOUNTER — Emergency Department (HOSPITAL_COMMUNITY)
Admission: EM | Admit: 2022-07-14 | Discharge: 2022-07-15 | Disposition: A | Discharge status: Home / Self Care | Payer: Medicare Other | Attending: Emergency Medicine | Admitting: Emergency Medicine

## 2022-07-14 ENCOUNTER — Telehealth: Payer: Self-pay | Admitting: Vascular Surgery

## 2022-07-14 ENCOUNTER — Other Ambulatory Visit: Payer: Self-pay

## 2022-07-14 DIAGNOSIS — Z7982 Long term (current) use of aspirin: Secondary | ICD-10-CM | POA: Diagnosis not present

## 2022-07-14 DIAGNOSIS — R197 Diarrhea, unspecified: Secondary | ICD-10-CM | POA: Diagnosis present

## 2022-07-14 DIAGNOSIS — Z7901 Long term (current) use of anticoagulants: Secondary | ICD-10-CM | POA: Diagnosis not present

## 2022-07-14 DIAGNOSIS — N179 Acute kidney failure, unspecified: Secondary | ICD-10-CM | POA: Diagnosis not present

## 2022-07-14 LAB — COMPREHENSIVE METABOLIC PANEL
ALT: 14 U/L (ref 0–44)
AST: 27 U/L (ref 15–41)
Albumin: 2.9 g/dL — ABNORMAL LOW (ref 3.5–5.0)
Alkaline Phosphatase: 45 U/L (ref 38–126)
Anion gap: 12 (ref 5–15)
BUN: 35 mg/dL — ABNORMAL HIGH (ref 8–23)
CO2: 23 mmol/L (ref 22–32)
Calcium: 7.8 mg/dL — ABNORMAL LOW (ref 8.9–10.3)
Chloride: 97 mmol/L — ABNORMAL LOW (ref 98–111)
Creatinine, Ser: 2.26 mg/dL — ABNORMAL HIGH (ref 0.61–1.24)
GFR, Estimated: 28 mL/min — ABNORMAL LOW (ref >60)
Glucose, Bld: 205 mg/dL — ABNORMAL HIGH (ref 70–99)
Potassium: 4.4 mmol/L (ref 3.5–5.1)
Sodium: 132 mmol/L — ABNORMAL LOW (ref 135–145)
Total Bilirubin: 1.5 mg/dL — ABNORMAL HIGH (ref 0.3–1.2)
Total Protein: 6.7 g/dL (ref 6.5–8.1)

## 2022-07-14 LAB — CBC WITH DIFFERENTIAL/PLATELET
Abs Immature Granulocytes: 0.06 10*3/uL (ref 0.00–0.07)
Basophils Absolute: 0 10*3/uL (ref 0.0–0.1)
Basophils Relative: 0 %
Eosinophils Absolute: 0 10*3/uL (ref 0.0–0.5)
Eosinophils Relative: 0 %
HCT: 30.2 % — ABNORMAL LOW (ref 39.0–52.0)
Hemoglobin: 9.9 g/dL — ABNORMAL LOW (ref 13.0–17.0)
Immature Granulocytes: 1 %
Lymphocytes Relative: 2 %
Lymphs Abs: 0.2 10*3/uL — ABNORMAL LOW (ref 0.7–4.0)
MCH: 31.4 pg (ref 26.0–34.0)
MCHC: 32.8 g/dL (ref 30.0–36.0)
MCV: 95.9 fL (ref 80.0–100.0)
Monocytes Absolute: 0.2 10*3/uL (ref 0.1–1.0)
Monocytes Relative: 2 %
Neutro Abs: 11.7 10*3/uL — ABNORMAL HIGH (ref 1.7–7.7)
Neutrophils Relative %: 95 %
Platelets: 133 10*3/uL — ABNORMAL LOW (ref 150–400)
RBC: 3.15 MIL/uL — ABNORMAL LOW (ref 4.22–5.81)
RDW: 16.9 % — ABNORMAL HIGH (ref 11.5–15.5)
WBC: 12.1 10*3/uL — ABNORMAL HIGH (ref 4.0–10.5)
nRBC: 0 % (ref 0.0–0.2)

## 2022-07-14 LAB — LIPASE, BLOOD: Lipase: 92 U/L — ABNORMAL HIGH (ref 11–51)

## 2022-07-14 NOTE — ED Triage Notes (Signed)
Patient BIB [facility] from [facility] for evaluation of diarrhea. Patient complains of left leg pain that is brought on by bearing weight on the leg, recent surgery, describes pain as soreness and states pain is making him less mobile.

## 2022-07-14 NOTE — ED Provider Triage Note (Signed)
Emergency Medicine Provider Triage Evaluation Note  Steve [name] , a 81 y.o. male  was evaluated in triage.  Pt complains of diarrhea. Report diarrhea since yesterday after being discharged home from L leg surgery due to poor circulation.  Denies fever, abd pain, nausea or vomiting.  Recent abx use.   Review of Systems  Positive: As above Negative: As above  Physical Exam  BP 102/86 (BP Location: Right Arm)   Pulse (!) 51   Temp 98.9 F (37.2 C) (Oral)   Resp (!) 22   Ht 5\' 9"  (1.753 m)   Wt 85.3 kg   SpO2 100%   BMI 27.76 kg/m  Gen:   Awake, no distress   Resp:  Normal effort  MSK:   Moves extremities without difficulty  Other:    Medical Decision Making  Medically screening exam initiated at 12:58 PM.  Appropriate orders placed.  Steve [name]. was informed that the remainder of the evaluation will be completed by another provider, this initial triage assessment does not replace that evaluation, and the importance of remaining in the ED until their evaluation is complete.     Domenic Moras, PA-C 07/14/22 1302

## 2022-07-14 NOTE — Telephone Encounter (Signed)
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    Thanks

## 2022-07-15 DIAGNOSIS — T8141XA Infection following a procedure, superficial incisional surgical site, initial encounter: Secondary | ICD-10-CM | POA: Diagnosis not present

## 2022-07-15 DIAGNOSIS — L03116 Cellulitis of left lower limb: Secondary | ICD-10-CM | POA: Diagnosis not present

## 2022-07-15 LAB — BASIC METABOLIC PANEL
Anion gap: 13 (ref 5–15)
BUN: 40 mg/dL — ABNORMAL HIGH (ref 8–23)
CO2: 21 mmol/L — ABNORMAL LOW (ref 22–32)
Calcium: 7.4 mg/dL — ABNORMAL LOW (ref 8.9–10.3)
Chloride: 97 mmol/L — ABNORMAL LOW (ref 98–111)
Creatinine, Ser: 2.13 mg/dL — ABNORMAL HIGH (ref 0.61–1.24)
GFR, Estimated: 30 mL/min — ABNORMAL LOW (ref >60)
Glucose, Bld: 138 mg/dL — ABNORMAL HIGH (ref 70–99)
Potassium: 3.8 mmol/L (ref 3.5–5.1)
Sodium: 131 mmol/L — ABNORMAL LOW (ref 135–145)

## 2022-07-15 MED ORDER — LACTATED RINGERS IV BOLUS
1000.0000 mL | Freq: Once | INTRAVENOUS | Status: AC
  Administered 2022-07-15: 1000 mL via INTRAVENOUS

## 2022-07-15 MED ORDER — LOPERAMIDE HCL 2 MG PO CAPS: 4.0000 mg | ORAL_CAPSULE | ORAL | Status: DC | PRN

## 2022-07-15 MED ORDER — LOPERAMIDE HCL 2 MG PO CAPS: 2.0000 mg | ORAL_CAPSULE | Freq: Four times a day (QID) | ORAL | 0 refills | Status: DC | PRN

## 2022-07-15 NOTE — ED Notes (Signed)
Spoke with Marta Antu at [facility] regarding patient being discharged

## 2022-07-15 NOTE — ED Provider Notes (Signed)
[facility] EMERGENCY DEPARTMENT Provider Note   CSN: [id number] Arrival date & time: 07/14/22  1242     History  Chief Complaint  Patient presents with   Diarrhea    Steve [name]. is a 81 y.o. male.  81 year old male that was recently discharged from the hospital after a bypass went home and stated that he had diarrhea since that day.  Multiple episodes a day, watery.  States that has not tried any medicine for the symptoms.  He has been in the ER for approximately 12 hours and has not had any diarrhea during that time. No fever. No nausea.    Diarrhea      Home Medications Prior to Admission medications   Medication Sig Start Date End Date Taking? Authorizing Provider  loperamide (IMODIUM) 2 MG capsule Take 1 capsule (2 mg total) by mouth 4 (four) times daily as needed for diarrhea or loose stools. 07/15/22  Yes Clell Trahan, Corene Cornea, MD  acetaminophen (TYLENOL) 500 MG tablet Take 500 mg by mouth every 6 (six) hours as needed for moderate pain.    [provider]  aspirin EC 81 MG tablet Take 1 tablet (81 mg total) by mouth daily. Swallow whole. 07/13/22   Kathie Dike, MD  atorvastatin (LIPITOR) 40 MG tablet Take 1 tablet (40 mg total) by mouth daily. 07/13/22   Kathie Dike, MD  blood glucose meter kit and supplies Dispense based on patient and insurance preference. Use up to four times daily as directed. (FOR ICD-10 E10.9, E11.9). 04/18/19   Mikhail, Velta Addison, DO  cefadroxil (DURICEF) 500 MG capsule Take 2 capsules (1,000 mg total) by mouth 2 (two) times daily for 7 days. 07/13/22 07/20/22  Kathie Dike, MD  Cholecalciferol (VITAMIN D3) 50 MCG (2000 UT) capsule Take 2,000 Units by mouth daily. 11/23/21   [provider]  doxazosin (CARDURA) 2 MG tablet Take 2 mg by mouth daily. 11/30/21   [provider]  doxycycline (VIBRA-TABS) 100 MG tablet Take 1 tablet (100 mg total) by mouth 2 (two) times daily for 7 days. 07/13/22 07/20/22  Kathie Dike, MD  ELIQUIS 2.5 MG TABS tablet Take 2.5 mg by mouth 2 (two) times daily. 11/23/21   [provider]  ezetimibe (ZETIA) 10 MG tablet Take 1 tablet (10 mg total) by mouth daily. 07/13/22   Kathie Dike, MD  hydrALAZINE (APRESOLINE) 50 MG tablet Take 1 tablet (50 mg total) by mouth every 8 (eight) hours. 07/13/22   Kathie Dike, MD  insulin detemir (LEVEMIR) 100 UNIT/ML injection Inject 0.18 mLs (18 Units total) into the skin daily. Patient taking differently: Inject 25 Units into the skin daily. 04/19/19   Mikhail, Velta Addison, DO  Insulin Syringe-Needle U-100 (INSULIN SYRINGE .3CC/31GX5/16") 31G X 5/16" 0.3 ML MISC Use with insulin 04/18/19   Mikhail, Velta Addison, DO  isosorbide mononitrate (IMDUR) 30 MG 24 hr tablet Take 1 tablet (30 mg total) by mouth daily. 07/13/22   Kathie Dike, MD  metFORMIN (GLUCOPHAGE) 1000 MG tablet Take 1,000 mg by mouth daily. 03/31/20   [provider]  metoprolol succinate (TOPROL-XL) 100 MG 24 hr tablet Take 1 tablet (100 mg total) by mouth daily. Take with or immediately following a meal. Patient taking differently: Take 100 mg by mouth daily with supper. Take with or immediately following a meal. 12/19/18   Reino Bellis B, NP  polyethylene glycol (MIRALAX / GLYCOLAX) 17 g packet Take 17 g by mouth daily as needed for mild constipation. 07/13/22  Kathie Dike, MD  vitamin B-12 (CYANOCOBALAMIN) 500 MCG tablet Take 500 mcg by mouth daily with breakfast.     [provider]  amLODipine (NORVASC) 10 MG tablet TAKE 1 TABLET DAILY Patient not taking: Reported on 05/20/2020 11/09/19 05/20/20  Lendon Colonel, NP  furosemide (LASIX) 40 MG tablet Take 1 tablet (40 mg total) by mouth daily. For 4 days then stop. 12/15/11 04/26/19  Nani Skillern, PA-C  insulin aspart (NOVOLOG) 100 UNIT/ML injection Inject 0-15 Units into the skin 3 (three) times daily with meals. CBG 121 - 150: 2 units;  CBG 151 - 200: 3 units;  CBG 201 - 250: 5 units;   CBG 251 - 300: 8 units; CBG 301 - 350: 11 units; CBG 351 - 400: 15 units;  CBG > 400 : 15 units and notify MD Patient not taking: Reported on 05/20/2020 04/18/19 05/20/20  Cristal Ford, DO      Allergies    Januvia [sitagliptin] and Statins    Review of Systems   Review of Systems  Gastrointestinal:  Positive for diarrhea.    Physical Exam Updated Vital Signs BP (!) 176/64   Pulse 88   Temp 98.4 F (36.9 C)   Resp 12   Ht _0  (1.753 m)   Wt 85.3 kg   SpO2 100%   BMI 27.76 kg/m  Physical Exam Vitals and nursing note reviewed.  Constitutional:      Appearance: He is well-developed.  HENT:     Head: Normocephalic and atraumatic.  Eyes:     Pupils: Pupils are equal, round, and reactive to light.     Comments: Sunken  Cardiovascular:     Rate and Rhythm: Normal rate.  Pulmonary:     Effort: Pulmonary effort is normal. No respiratory distress.  Abdominal:     General: Abdomen is flat. There is no distension.  Musculoskeletal:        General: Normal range of motion.     Cervical back: Normal range of motion.  Skin:    General: Skin is warm and dry.  Neurological:     General: No focal deficit present.     Mental Status: He is alert.     ED Results / Procedures / Treatments   Labs (all labs ordered are listed, but only abnormal results are displayed) Labs Reviewed  CBC WITH DIFFERENTIAL/PLATELET - Abnormal; Notable for the following components:      Result Value   WBC 12.1 (*)    RBC 3.15 (*)    Hemoglobin 9.9 (*)    HCT 30.2 (*)    RDW 16.9 (*)    Platelets 133 (*)    Neutro Abs 11.7 (*)    Lymphs Abs 0.2 (*)    All other components within normal limits  COMPREHENSIVE METABOLIC PANEL - Abnormal; Notable for the following components:   Sodium 132 (*)    Chloride 97 (*)    Glucose, Bld 205 (*)    BUN 35 (*)    Creatinine, Ser 2.26 (*)    Calcium 7.8 (*)    Albumin 2.9 (*)    Total Bilirubin 1.5 (*)    GFR, Estimated 28 (*)    All other  components within normal limits  LIPASE, BLOOD - Abnormal; Notable for the following components:   Lipase 92 (*)    All other components within normal limits  BASIC METABOLIC PANEL - Abnormal; Notable for the following components:   Sodium 131 (*)  Chloride 97 (*)    CO2 21 (*)    Glucose, Bld 138 (*)    BUN 40 (*)    Creatinine, Ser 2.13 (*)    Calcium 7.4 (*)    GFR, Estimated 30 (*)    All other components within normal limits  C DIFFICILE QUICK SCREEN W PCR REFLEX    URINALYSIS, ROUTINE W REFLEX MICROSCOPIC    EKG None  Radiology No results found.  Procedures Procedures    Medications Ordered in ED Medications  loperamide (IMODIUM) capsule 4 mg (has no administration in time range)  lactated ringers bolus 1,000 mL (0 mLs Intravenous Stopped 07/15/22 0418)    ED Course/ Medical Decision Making/ A&P                           Medical Decision Making Amount and/or Complexity of Data Reviewed Labs: ordered.  Risk Prescription drug management.  Mild AKI. No diarrhea now. Pending Cdiff. Will recheck creatinine prior to d/c as he has been here for quite awhile.  Repeat BMP improved. Still no diarrhea here. Tolerating PO, will continue to hydrate at home with PCP follow up to recheck creatinine.    Final Clinical Impression(s) / ED Diagnoses Final diagnoses:  Diarrhea, unspecified type  AKI (acute kidney injury) ([place])    Rx / DC Orders ED Discharge Orders          Ordered    loperamide (IMODIUM) 2 MG capsule  4 times daily PRN        07/15/22 0525              Ceylin Dreibelbis, Corene Cornea, MD 07/15/22 [phone]

## 2022-07-18 ENCOUNTER — Emergency Department (HOSPITAL_BASED_OUTPATIENT_CLINIC_OR_DEPARTMENT_OTHER)
Admit: 2022-07-18 | Discharge: 2022-07-18 | Disposition: A | Discharge status: Home / Self Care | Payer: Medicare Other | Attending: Vascular Surgery | Admitting: Vascular Surgery

## 2022-07-18 ENCOUNTER — Other Ambulatory Visit: Payer: Self-pay

## 2022-07-18 ENCOUNTER — Emergency Department (HOSPITAL_COMMUNITY): Payer: Medicare Other

## 2022-07-18 ENCOUNTER — Inpatient Hospital Stay (HOSPITAL_COMMUNITY)
Admission: EM | Admit: 2022-07-18 | Discharge: 2022-07-26 | DRG: 862 | Disposition: A | Discharge status: Skilled Nursing Facility | Payer: Medicare Other | Source: Skilled Nursing Facility | Attending: Internal Medicine | Admitting: Internal Medicine

## 2022-07-18 ENCOUNTER — Inpatient Hospital Stay (HOSPITAL_COMMUNITY): Payer: Medicare Other

## 2022-07-18 DIAGNOSIS — I2510 Atherosclerotic heart disease of native coronary artery without angina pectoris: Secondary | ICD-10-CM | POA: Diagnosis not present

## 2022-07-18 DIAGNOSIS — Z20822 Contact with and (suspected) exposure to covid-19: Secondary | ICD-10-CM | POA: Diagnosis present

## 2022-07-18 DIAGNOSIS — Z794 Long term (current) use of insulin: Secondary | ICD-10-CM

## 2022-07-18 DIAGNOSIS — I10 Essential (primary) hypertension: Secondary | ICD-10-CM | POA: Diagnosis present

## 2022-07-18 DIAGNOSIS — T8141XA Infection following a procedure, superficial incisional surgical site, initial encounter: Secondary | ICD-10-CM | POA: Diagnosis present

## 2022-07-18 DIAGNOSIS — I6621 Occlusion and stenosis of right posterior cerebral artery: Secondary | ICD-10-CM | POA: Diagnosis present

## 2022-07-18 DIAGNOSIS — I255 Ischemic cardiomyopathy: Secondary | ICD-10-CM | POA: Diagnosis present

## 2022-07-18 DIAGNOSIS — I5042 Chronic combined systolic (congestive) and diastolic (congestive) heart failure: Secondary | ICD-10-CM | POA: Diagnosis present

## 2022-07-18 DIAGNOSIS — I6389 Other cerebral infarction: Secondary | ICD-10-CM | POA: Diagnosis present

## 2022-07-18 DIAGNOSIS — N179 Acute kidney failure, unspecified: Secondary | ICD-10-CM | POA: Diagnosis present

## 2022-07-18 DIAGNOSIS — E1122 Type 2 diabetes mellitus with diabetic chronic kidney disease: Secondary | ICD-10-CM | POA: Diagnosis present

## 2022-07-18 DIAGNOSIS — Z79899 Other long term (current) drug therapy: Secondary | ICD-10-CM

## 2022-07-18 DIAGNOSIS — L03116 Cellulitis of left lower limb: Secondary | ICD-10-CM | POA: Diagnosis present

## 2022-07-18 DIAGNOSIS — E871 Hypo-osmolality and hyponatremia: Secondary | ICD-10-CM | POA: Diagnosis present

## 2022-07-18 DIAGNOSIS — K2901 Acute gastritis with bleeding: Secondary | ICD-10-CM | POA: Diagnosis present

## 2022-07-18 DIAGNOSIS — H524 Presbyopia: Secondary | ICD-10-CM | POA: Insufficient documentation

## 2022-07-18 DIAGNOSIS — Z7984 Long term (current) use of oral hypoglycemic drugs: Secondary | ICD-10-CM

## 2022-07-18 DIAGNOSIS — E11649 Type 2 diabetes mellitus with hypoglycemia without coma: Secondary | ICD-10-CM | POA: Diagnosis present

## 2022-07-18 DIAGNOSIS — Z87891 Personal history of nicotine dependence: Secondary | ICD-10-CM | POA: Diagnosis not present

## 2022-07-18 DIAGNOSIS — N3001 Acute cystitis with hematuria: Secondary | ICD-10-CM | POA: Diagnosis present

## 2022-07-18 DIAGNOSIS — E785 Hyperlipidemia, unspecified: Secondary | ICD-10-CM | POA: Diagnosis present

## 2022-07-18 DIAGNOSIS — E1151 Type 2 diabetes mellitus with diabetic peripheral angiopathy without gangrene: Secondary | ICD-10-CM | POA: Diagnosis present

## 2022-07-18 DIAGNOSIS — I130 Hypertensive heart and chronic kidney disease with heart failure and stage 1 through stage 4 chronic kidney disease, or unspecified chronic kidney disease: Secondary | ICD-10-CM | POA: Diagnosis not present

## 2022-07-18 DIAGNOSIS — D649 Anemia, unspecified: Secondary | ICD-10-CM | POA: Diagnosis present

## 2022-07-18 DIAGNOSIS — N189 Chronic kidney disease, unspecified: Secondary | ICD-10-CM | POA: Diagnosis not present

## 2022-07-18 DIAGNOSIS — D62 Acute posthemorrhagic anemia: Secondary | ICD-10-CM | POA: Diagnosis not present

## 2022-07-18 DIAGNOSIS — R404 Transient alteration of awareness: Secondary | ICD-10-CM

## 2022-07-18 DIAGNOSIS — A419 Sepsis, unspecified organism: Principal | ICD-10-CM

## 2022-07-18 DIAGNOSIS — I6523 Occlusion and stenosis of bilateral carotid arteries: Secondary | ICD-10-CM | POA: Diagnosis present

## 2022-07-18 DIAGNOSIS — Z823 Family history of stroke: Secondary | ICD-10-CM

## 2022-07-18 DIAGNOSIS — D631 Anemia in chronic kidney disease: Secondary | ICD-10-CM | POA: Diagnosis not present

## 2022-07-18 DIAGNOSIS — Z7901 Long term (current) use of anticoagulants: Secondary | ICD-10-CM

## 2022-07-18 DIAGNOSIS — G9341 Metabolic encephalopathy: Secondary | ICD-10-CM | POA: Diagnosis present

## 2022-07-18 DIAGNOSIS — E119 Type 2 diabetes mellitus without complications: Secondary | ICD-10-CM

## 2022-07-18 DIAGNOSIS — Z951 Presence of aortocoronary bypass graft: Secondary | ICD-10-CM

## 2022-07-18 DIAGNOSIS — E440 Moderate protein-calorie malnutrition: Secondary | ICD-10-CM | POA: Diagnosis present

## 2022-07-18 DIAGNOSIS — I5043 Acute on chronic combined systolic (congestive) and diastolic (congestive) heart failure: Secondary | ICD-10-CM | POA: Diagnosis not present

## 2022-07-18 DIAGNOSIS — I639 Cerebral infarction, unspecified: Secondary | ICD-10-CM | POA: Diagnosis not present

## 2022-07-18 DIAGNOSIS — L0390 Cellulitis, unspecified: Secondary | ICD-10-CM

## 2022-07-18 DIAGNOSIS — E162 Hypoglycemia, unspecified: Secondary | ICD-10-CM

## 2022-07-18 DIAGNOSIS — I480 Paroxysmal atrial fibrillation: Secondary | ICD-10-CM | POA: Diagnosis present

## 2022-07-18 DIAGNOSIS — K921 Melena: Secondary | ICD-10-CM

## 2022-07-18 DIAGNOSIS — Y832 Surgical operation with anastomosis, bypass or graft as the cause of abnormal reaction of the patient, or of later complication, without mention of misadventure at the time of the procedure: Secondary | ICD-10-CM | POA: Diagnosis present

## 2022-07-18 DIAGNOSIS — N1832 Chronic kidney disease, stage 3b: Secondary | ICD-10-CM | POA: Diagnosis present

## 2022-07-18 DIAGNOSIS — K922 Gastrointestinal hemorrhage, unspecified: Secondary | ICD-10-CM | POA: Diagnosis present

## 2022-07-18 DIAGNOSIS — E876 Hypokalemia: Secondary | ICD-10-CM | POA: Diagnosis present

## 2022-07-18 DIAGNOSIS — Z7982 Long term (current) use of aspirin: Secondary | ICD-10-CM

## 2022-07-18 DIAGNOSIS — Z9049 Acquired absence of other specified parts of digestive tract: Secondary | ICD-10-CM

## 2022-07-18 DIAGNOSIS — I509 Heart failure, unspecified: Secondary | ICD-10-CM | POA: Diagnosis not present

## 2022-07-18 DIAGNOSIS — E869 Volume depletion, unspecified: Secondary | ICD-10-CM | POA: Diagnosis present

## 2022-07-18 LAB — CBC WITH DIFFERENTIAL/PLATELET
Abs Immature Granulocytes: 0.03 10*3/uL (ref 0.00–0.07)
Basophils Absolute: 0 10*3/uL (ref 0.0–0.1)
Basophils Relative: 0 %
Eosinophils Absolute: 0.1 10*3/uL (ref 0.0–0.5)
Eosinophils Relative: 1 %
HCT: 23.9 % — ABNORMAL LOW (ref 39.0–52.0)
Hemoglobin: 8 g/dL — ABNORMAL LOW (ref 13.0–17.0)
Immature Granulocytes: 0 %
Lymphocytes Relative: 6 %
Lymphs Abs: 0.5 10*3/uL — ABNORMAL LOW (ref 0.7–4.0)
MCH: 30.4 pg (ref 26.0–34.0)
MCHC: 33.5 g/dL (ref 30.0–36.0)
MCV: 90.9 fL (ref 80.0–100.0)
Monocytes Absolute: 0.2 10*3/uL (ref 0.1–1.0)
Monocytes Relative: 2 %
Neutro Abs: 7.7 10*3/uL (ref 1.7–7.7)
Neutrophils Relative %: 91 %
Platelets: 111 10*3/uL — ABNORMAL LOW (ref 150–400)
RBC: 2.63 MIL/uL — ABNORMAL LOW (ref 4.22–5.81)
RDW: 16.3 % — ABNORMAL HIGH (ref 11.5–15.5)
WBC: 8.5 10*3/uL (ref 4.0–10.5)
nRBC: 0 % (ref 0.0–0.2)

## 2022-07-18 LAB — COMPREHENSIVE METABOLIC PANEL
ALT: 21 U/L (ref 0–44)
AST: 53 U/L — ABNORMAL HIGH (ref 15–41)
Albumin: 2.7 g/dL — ABNORMAL LOW (ref 3.5–5.0)
Alkaline Phosphatase: 56 U/L (ref 38–126)
Anion gap: 10 (ref 5–15)
BUN: 40 mg/dL — ABNORMAL HIGH (ref 8–23)
CO2: 20 mmol/L — ABNORMAL LOW (ref 22–32)
Calcium: 7.6 mg/dL — ABNORMAL LOW (ref 8.9–10.3)
Chloride: 96 mmol/L — ABNORMAL LOW (ref 98–111)
Creatinine, Ser: 2.38 mg/dL — ABNORMAL HIGH (ref 0.61–1.24)
GFR, Estimated: 26 mL/min — ABNORMAL LOW (ref >60)
Glucose, Bld: 62 mg/dL — ABNORMAL LOW (ref 70–99)
Potassium: 4.5 mmol/L (ref 3.5–5.1)
Sodium: 126 mmol/L — ABNORMAL LOW (ref 135–145)
Total Bilirubin: 1.5 mg/dL — ABNORMAL HIGH (ref 0.3–1.2)
Total Protein: 6.3 g/dL — ABNORMAL LOW (ref 6.5–8.1)

## 2022-07-18 LAB — URINALYSIS, ROUTINE W REFLEX MICROSCOPIC
Bacteria, UA: NONE SEEN
Bilirubin Urine: NEGATIVE
Glucose, UA: NEGATIVE mg/dL
Ketones, ur: NEGATIVE mg/dL
Nitrite: NEGATIVE
Protein, ur: 100 mg/dL — AB
Specific Gravity, Urine: 1.015 (ref 1.005–1.030)
pH: 5 (ref 5.0–8.0)

## 2022-07-18 LAB — BRAIN NATRIURETIC PEPTIDE: B Natriuretic Peptide: 809.5 pg/mL — ABNORMAL HIGH (ref 0.0–100.0)

## 2022-07-18 LAB — CBG MONITORING, ED
Glucose-Capillary: 134 mg/dL — ABNORMAL HIGH (ref 70–99)
Glucose-Capillary: 57 mg/dL — ABNORMAL LOW (ref 70–99)
Glucose-Capillary: 88 mg/dL (ref 70–99)
Glucose-Capillary: 59 mg/dL — ABNORMAL LOW (ref 70–99)
Glucose-Capillary: 74 mg/dL (ref 70–99)
Glucose-Capillary: 66 mg/dL — ABNORMAL LOW (ref 70–99)
Glucose-Capillary: 75 mg/dL (ref 70–99)

## 2022-07-18 LAB — LACTIC ACID, PLASMA
Lactic Acid, Venous: 1.7 mmol/L (ref 0.5–1.9)
Lactic Acid, Venous: 1 mmol/L (ref 0.5–1.9)

## 2022-07-18 LAB — POC OCCULT BLOOD, ED: Fecal Occult Bld: POSITIVE — AB

## 2022-07-18 LAB — RESP PANEL BY RT-PCR (FLU A&B, COVID) ARPGX2
Influenza A by PCR: NEGATIVE
Influenza B by PCR: NEGATIVE
SARS Coronavirus 2 by RT PCR: NEGATIVE

## 2022-07-18 LAB — TYPE AND SCREEN
ABO/RH(D): O POS
Antibody Screen: NEGATIVE

## 2022-07-18 LAB — HEMOGLOBIN AND HEMATOCRIT, BLOOD
HCT: 23 % — ABNORMAL LOW (ref 39.0–52.0)
Hemoglobin: 7.8 g/dL — ABNORMAL LOW (ref 13.0–17.0)

## 2022-07-18 LAB — PROTIME-INR
INR: 1.4 — ABNORMAL HIGH (ref 0.8–1.2)
Prothrombin Time: 17.4 seconds — ABNORMAL HIGH (ref 11.4–15.2)

## 2022-07-18 MED ORDER — DEXTROSE 50 % IV SOLN
12.5000 g | INTRAVENOUS | Status: AC
  Filled 2022-07-18: qty 50

## 2022-07-18 MED ORDER — DEXTROSE 50 % IV SOLN: 1.0000 | Freq: Once | INTRAVENOUS | Status: AC

## 2022-07-18 MED ORDER — ACETAMINOPHEN 325 MG PO TABS
650.0000 mg | ORAL_TABLET | Freq: Once | ORAL | Status: AC
  Administered 2022-07-18: 650 mg via ORAL
  Filled 2022-07-18: qty 2

## 2022-07-18 MED ORDER — DEXTROSE 50 % IV SOLN
1.0000 | Freq: Once | INTRAVENOUS | Status: AC
  Administered 2022-07-18: 50 mL via INTRAVENOUS

## 2022-07-18 MED ORDER — PROCHLORPERAZINE EDISYLATE 10 MG/2ML IJ SOLN: 5.0000 mg | INTRAMUSCULAR | Status: DC | PRN

## 2022-07-18 MED ORDER — METOPROLOL SUCCINATE ER 100 MG PO TB24
100.0000 mg | ORAL_TABLET | Freq: Every day | ORAL | Status: DC
  Administered 2022-07-18 – 2022-07-25 (×8): 100 mg via ORAL
  Filled 2022-07-18 (×2): qty 1
  Filled 2022-07-18: qty 2
  Filled 2022-07-18 (×5): qty 1

## 2022-07-18 MED ORDER — DEXTROSE 50 % IV SOLN
INTRAVENOUS | Status: AC
  Administered 2022-07-18: 12.5 g via INTRAVENOUS
  Filled 2022-07-18: qty 50

## 2022-07-18 MED ORDER — LORAZEPAM 2 MG/ML IJ SOLN: 0.5000 mg | Freq: Once | INTRAMUSCULAR | Status: DC | PRN

## 2022-07-18 MED ORDER — PANTOPRAZOLE SODIUM 40 MG IV SOLR
40.0000 mg | Freq: Once | INTRAVENOUS | Status: AC
  Administered 2022-07-18: 40 mg via INTRAVENOUS
  Filled 2022-07-18: qty 10

## 2022-07-18 MED ORDER — LACTATED RINGERS IV BOLUS
1000.0000 mL | Freq: Once | INTRAVENOUS | Status: AC
  Administered 2022-07-18: 1000 mL via INTRAVENOUS

## 2022-07-18 MED ORDER — ATORVASTATIN CALCIUM 40 MG PO TABS: 40.0000 mg | ORAL_TABLET | Freq: Every day | ORAL | Status: DC

## 2022-07-18 MED ORDER — SODIUM CHLORIDE 0.9 % IV SOLN
1.0000 g | INTRAVENOUS | Status: DC
  Administered 2022-07-19 – 2022-07-20 (×2): 1 g via INTRAVENOUS
  Filled 2022-07-18 (×2): qty 10

## 2022-07-18 MED ORDER — VANCOMYCIN HCL 1500 MG/300ML IV SOLN
1500.0000 mg | Freq: Once | INTRAVENOUS | Status: AC
  Administered 2022-07-18: 1500 mg via INTRAVENOUS
  Filled 2022-07-18: qty 300

## 2022-07-18 MED ORDER — DEXTROSE-NACL 5-0.9 % IV SOLN: INTRAVENOUS | Status: DC

## 2022-07-18 MED ORDER — SODIUM CHLORIDE 0.9 % IV SOLN
2.0000 g | Freq: Once | INTRAVENOUS | Status: AC
  Administered 2022-07-18: 2 g via INTRAVENOUS
  Filled 2022-07-18: qty 20

## 2022-07-18 MED ORDER — VANCOMYCIN HCL 1500 MG/300ML IV SOLN
1500.0000 mg | INTRAVENOUS | Status: AC
  Administered 2022-07-20 – 2022-07-22 (×2): 1500 mg via INTRAVENOUS
  Filled 2022-07-18 (×2): qty 300

## 2022-07-18 MED ORDER — GLUCAGON HCL RDNA (DIAGNOSTIC) 1 MG IJ SOLR
1.0000 mg | Freq: Once | INTRAMUSCULAR | Status: AC
  Administered 2022-07-18: 1 mg via INTRAVENOUS
  Filled 2022-07-18: qty 1

## 2022-07-18 MED ORDER — DEXTROSE 50 % IV SOLN
INTRAVENOUS | Status: AC
  Administered 2022-07-18: 50 mL via INTRAVENOUS
  Filled 2022-07-18: qty 50

## 2022-07-18 MED ORDER — ACETAMINOPHEN 325 MG PO TABS
650.0000 mg | ORAL_TABLET | Freq: Four times a day (QID) | ORAL | Status: DC | PRN
  Administered 2022-07-20 (×2): 650 mg via ORAL
  Filled 2022-07-18 (×2): qty 2

## 2022-07-18 MED ORDER — ACETAMINOPHEN 650 MG RE SUPP: 650.0000 mg | Freq: Four times a day (QID) | RECTAL | Status: DC | PRN

## 2022-07-18 MED ORDER — ISOSORBIDE MONONITRATE ER 30 MG PO TB24
30.0000 mg | ORAL_TABLET | Freq: Every day | ORAL | Status: DC
  Administered 2022-07-19 – 2022-07-26 (×7): 30 mg via ORAL
  Filled 2022-07-18 (×7): qty 1

## 2022-07-18 NOTE — ED Notes (Signed)
Pt found to be halfway out of bed and uncovered. This Probation officer and RN repositioned pt and reconnected all monitoring devices. Pt linens and gown changed and new, clean blankets applied. Pt resting comfortably.

## 2022-07-18 NOTE — H&P (Signed)
History and Physical    Patient: Steve [name]. MVE:[id number] DOB: [birth year] DOA: 07/18/2022 DOS: the patient was seen and examined on 07/18/2022 PCP: System, Provider Not In  Patient coming from: Home  Chief Complaint:  Chief Complaint  Patient presents with   Code Sepsis   HPI: Steve [name]. is a 81 y.o. 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 Stanford Breed who is being brought to the emergency department due to altered mental status with significant confusion and slurred speech.  According to his son, he was talking to him on the phone earlier.  He was last at baseline 40 minutes before his son found him confused when he came to visit him.  EMS was called.  They gave him a 1000 mL normal saline bolus.  He is unable to provide further information and history is taken from his son.  He believes the patient gave himself insulin this morning and thinks that he is probably going to need someone to administer insulin for him in the future to avoid any administration issues.  He was found to be hypoglycemic.  Subsequently, the patient was fully oriented briefly, but had a having slurred speech again, was hypoglycemic, but did not respond to dextrose.  ED course: Initial vital signs were temperature 100.5 F, pulse 90, respiration 21, BP 1 34/52 mmHg O2 sat 100% on room air.  The patient received vancomycin, ceftriaxone and 1000 mL of LR bolus.  While I was evaluating him, he became hypoglycemic.  50 mL of dextrose 50% were administered and he was started on 5% dextrose with 0.9% NaCl  at 75 mL/h.  Lab work: Urinalysis was hazy with moderate hemoglobinuria, proteinuria 100 mg/dL, moderate leukocyte esterase.  RBC 21-50 and WBC 21-50 and no bacteria seen on microscopic examination.  Fecal occult blood was positive.  CBC showed a white count of 8.5, hemoglobin 8.0 g/dL platelets 111.  PT 17.4 and INR 1.4.  Lactic acid was 1.7 then 1.0 mmol/L.  CMP with a sodium of 126, potassium 4.5, chloride 96 and CO2 20 mmol/L.  Glucose was 62, BUN 40 and creatinine 2.30 a milligrams/deciliter.  Calcium is normal when corrected to albumin.  Total protein 6.3 and albumin level is 2.7 g/dL.  AST was 53, normal ALT and alkaline phosphatase.  Total bilirubin was 1.5 mg/dL.  Imaging: Two-view portable chest radiograph with no acute findings.  CT head showed chronic microvascular ischemic changes and cerebral volume loss, but no acute intracranial abnormality.   Review of Systems: As mentioned in the history of present illness. All other systems reviewed and are negative. Past Medical History:  Diagnosis Date   CAD (coronary artery disease)    a. s/p CABG x 29 Nov 2011 with LIMA to LAD, SVG to OM1 and distal LCX, SVG to ramus intermediate   Essential hypertension    Hyperlipidemia    Intermittent claudication ([place])    Ischemic cardiomyopathy    a. 03/2015 Echo: EF45-50%, Gr1 DD, mild MR.   PAF (paroxysmal atrial fibrillation) ([place])    a. post op atrial fib 11/2011; short course of amiodarone; stopped 01/25/12   Peripheral arterial disease ([place])    Right bundle branch block  Type II diabetes mellitus [facility])    Past Surgical History:  Procedure Laterality Date   ABDOMINAL AORTOGRAM W/LOWER EXTREMITY Left 07/02/2022   Procedure: ABDOMINAL AORTOGRAM W/LOWER EXTREMITY;  Surgeon: Cherre Robins, MD;  Location: [place] [facility];  Service: Cardiovascular;  Laterality: Left;   AMPUTATION Left 07/09/2022   Procedure: PARTIAL LEFT FIFTH RAY AMPUTATION;  Surgeon: Edrick Kins, DPM;  Location: [place];   Service: Podiatry;  Laterality: Left;   BYPASS GRAFT FEMORAL-PERONEAL Left 07/07/2022   Procedure: LEFT FEMORAL-PERONEAL ARTERY BYPASS;  Surgeon: Cherre Robins, MD;  Location: [facility] OR;  Service: Vascular;  Laterality: Left;   CARDIAC CATHETERIZATION  01/16/1999   Est. EF of 65% -- Nonobstruction atherosclerotic coronary artery disease -- Normal left ventricular function       CARDIAC ELECTROPHYSIOLOGY STUDY AND ABLATION  05/21/1999   Normal sinus funtion -- Mildly prolonged interatrial conduction times -- Normal A-V node funtion -- Normal His Purkinje system function -- fNo accessory pathway -- No inducible ventricular tachycardia in the presence of the or in the absence of isoproterenol with programmed stimulation or with burst pacing -- Nikki Dom, M.D. [place]   CORONARY ARTERY BYPASS GRAFT  12/10/2011   Procedure: CORONARY ARTERY BYPASS GRAFTING (CABG);  Surgeon: Tharon Aquas Adelene Idler, MD;  Location: [place];  Service: Open Heart Surgery;  Laterality: N/A;  Coronary Artery bypass graft on pump times four utilizing left internal mammary artery and left saphenous vein harvested endoscopically   LEFT HEART CATH AND CORS/GRAFTS ANGIOGRAPHY N/A 12/18/2018   Procedure: LEFT HEART CATH AND CORS/GRAFTS ANGIOGRAPHY;  Surgeon: Belva Crome, MD;  Location: [place] [facility];  Service: Cardiovascular;  Laterality: N/A;   LEFT HEART CATHETERIZATION WITH CORONARY ANGIOGRAM N/A 12/09/2011   Procedure: LEFT HEART CATHETERIZATION WITH CORONARY ANGIOGRAM;  Surgeon: Peter M Martinique, MD;  Location: [facility] CATH LAB;  Service: Cardiovascular;  Laterality: N/A;   LOWER EXTREMITY ANGIOGRAM Left 07/28/2018   Procedure: Lower Extremity Angiogram;  Surgeon: Angelia Mould, MD;  Location: [place] [facility];  Service: Cardiovascular;  Laterality: Left;   Social History:  reports that he quit smoking about 43 years ago. His smoking use included cigarettes. He has a 30.00 pack-year  smoking history. He has quit using smokeless tobacco. He reports current alcohol use of about 4.0 standard drinks of alcohol per week. He reports that he does not use drugs.  Allergies  Allergen Reactions   Januvia [Sitagliptin] Other (See Comments)    Soreness to stomach area. Intolerance    Statins Other (See Comments)    Myalgias, muscle weakness, swelling, pain    Family History  Problem Relation Age of Onset   Stroke Mother 72       Cerebellar hemorrhage    Prior to Admission medications   Medication Sig Start Date End Date Taking? Authorizing Provider  acetaminophen (TYLENOL) 500 MG tablet Take 500 mg by mouth every 6 (six) hours as needed for moderate pain.    [provider]  aspirin EC 81 MG tablet Take 1 tablet (81 mg total) by mouth daily. Swallow whole. 07/13/22   Kathie Dike, MD  atorvastatin (LIPITOR) 40 MG tablet Take 1 tablet (40 mg total) by mouth daily. 07/13/22   Kathie Dike, MD  blood glucose meter kit and supplies Dispense based on patient and insurance preference. Use up to four times daily as directed. (FOR ICD-10 E10.9, E11.9).  04/18/19   Mikhail, Velta Addison, DO  cefadroxil (DURICEF) 500 MG capsule Take 2 capsules (1,000 mg total) by mouth 2 (two) times daily for 7 days. 07/13/22 07/20/22  Kathie Dike, MD  Cholecalciferol (VITAMIN D3) 50 MCG (2000 UT) capsule Take 2,000 Units by mouth daily. 11/23/21   [provider]  doxazosin (CARDURA) 2 MG tablet Take 2 mg by mouth daily. 11/30/21   [provider]  doxycycline (VIBRA-TABS) 100 MG tablet Take 1 tablet (100 mg total) by mouth 2 (two) times daily for 7 days. 07/13/22 07/20/22  Kathie Dike, MD  ELIQUIS 2.5 MG TABS tablet Take 2.5 mg by mouth 2 (two) times daily. 11/23/21   [provider]  ezetimibe (ZETIA) 10 MG tablet Take 1 tablet (10 mg total) by mouth daily. 07/13/22   Kathie Dike, MD  hydrALAZINE (APRESOLINE) 50 MG tablet Take 1 tablet (50 mg total) by mouth every 8  (eight) hours. 07/13/22   Kathie Dike, MD  insulin detemir (LEVEMIR) 100 UNIT/ML injection Inject 0.18 mLs (18 Units total) into the skin daily. Patient taking differently: Inject 25 Units into the skin daily. 04/19/19   Mikhail, Velta Addison, DO  Insulin Syringe-Needle U-100 (INSULIN SYRINGE .3CC/31GX5/16") 31G X 5/16" 0.3 ML MISC Use with insulin 04/18/19   Mikhail, Velta Addison, DO  isosorbide mononitrate (IMDUR) 30 MG 24 hr tablet Take 1 tablet (30 mg total) by mouth daily. 07/13/22   Kathie Dike, MD  loperamide (IMODIUM) 2 MG capsule Take 1 capsule (2 mg total) by mouth 4 (four) times daily as needed for diarrhea or loose stools. 07/15/22   Mesner, Corene Cornea, MD  metFORMIN (GLUCOPHAGE) 1000 MG tablet Take 1,000 mg by mouth daily. 03/31/20   [provider]  metoprolol succinate (TOPROL-XL) 100 MG 24 hr tablet Take 1 tablet (100 mg total) by mouth daily. Take with or immediately following a meal. Patient taking differently: Take 100 mg by mouth daily with supper. Take with or immediately following a meal. 12/19/18   Reino Bellis B, NP  polyethylene glycol (MIRALAX / GLYCOLAX) 17 g packet Take 17 g by mouth daily as needed for mild constipation. 07/13/22   Kathie Dike, MD  vitamin B-12 (CYANOCOBALAMIN) 500 MCG tablet Take 500 mcg by mouth daily with breakfast.     [provider]  amLODipine (NORVASC) 10 MG tablet TAKE 1 TABLET DAILY Patient not taking: Reported on 05/20/2020 11/09/19 05/20/20  Lendon Colonel, NP  furosemide (LASIX) 40 MG tablet Take 1 tablet (40 mg total) by mouth daily. For 4 days then stop. 12/15/11 04/26/19  Nani Skillern, PA-C  insulin aspart (NOVOLOG) 100 UNIT/ML injection Inject 0-15 Units into the skin 3 (three) times daily with meals. CBG 121 - 150: 2 units;  CBG 151 - 200: 3 units;  CBG 201 - 250: 5 units;  CBG 251 - 300: 8 units; CBG 301 - 350: 11 units; CBG 351 - 400: 15 units;  CBG > 400 : 15 units and notify MD Patient not taking: Reported on  05/20/2020 04/18/19 05/20/20  Cristal Ford, DO    Physical Exam: Vitals:   07/18/22 1430 07/18/22 1445 07/18/22 1500 07/18/22 1515  BP: (!) 119/54 (!) 120/55 (!) 126/54 (!) 123/52  Pulse: 79 76  80  Resp: (!) 21 (!) 21 16 (!) 24  Temp:      TempSrc:      SpO2: 98% 98%  97%  Weight:      Height:       Physical Exam Vitals and  nursing note reviewed.  Constitutional:      General: He is not in acute distress.    Appearance: Normal appearance. He is ill-appearing.  HENT:     Head: Normocephalic.     Nose: No rhinorrhea.     Mouth/Throat:     Mouth: Mucous membranes are dry.  Eyes:     General: No scleral icterus.    Pupils: Pupils are equal, round, and reactive to light.  Neck:     Vascular: No JVD.  Cardiovascular:     Rate and Rhythm: Normal rate and regular rhythm.     Heart sounds: S1 normal and S2 normal.  Pulmonary:     Effort: Pulmonary effort is normal.     Breath sounds: Normal breath sounds.  Abdominal:     General: Bowel sounds are normal. There is no distension.     Palpations: Abdomen is soft.     Tenderness: There is no abdominal tenderness. There is no guarding.  Musculoskeletal:     Cervical back: Neck supple.     Right lower leg: No edema.     Left lower leg: No edema.  Skin:    General: Skin is warm and dry.  Neurological:     General: No focal deficit present.     Mental Status: He is lethargic and disoriented.          Data Reviewed:  Results are pending, will review when available.  Assessment and Plan: Principal Problem:   Cellulitis of left lower extremity Admit to PCU/inpatient. Continue ceftriaxone 1 g every 24 hours. Continue vancomycin per pharmacy. Follow-up blood culture and sensitivity Follow CBC and CMP in a.m. Defer analgesics for now.  Active Problems:   Hypoglycemia Dextrose 10% deferred due to hyponatremia. Continue dextrose 5% plus NaCl 0.9% at 75 mL/h. CBG every 2 hours. Glucagon 1 mg IVP. Dextrose 50% as  needed.    Diabetes mellitus, type 2 (HCC) Currently hypoglycemic. Monitor CBG closely.    GI bleed Will not be transferred today to [facility]. [facility] on-call recommended [facility] consult. EDP has sent secure chat to [facility] on-call.    Normocytic anemia Superimposed on   Acute blood loss anemia Monitor hematocrit and hemoglobin closely. Transfuse as needed.    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 Monitor fluid status closely.    Hypertension Continue metoprolol succinate 100 mg p.o. daily.    Hyperlipidemia We will switch pravastatin to atorvastatin.    CAD (coronary artery disease) Apixaban held. Continue metoprolol and statin.    PAF (paroxysmal atrial fibrillation) (HCC) Anticoagulation held. Continue metoprolol for rate control.    Moderate protein malnutrition (HCC) Protein supplementation. Consider nutritional services evaluation.      Advance Care Planning:   Code Status: Full Code   Consults: Vascular surgery Jamelle Haring, MD).  Gastroenterology has been contacted by the emergency department.  Family Communication: His son Steve [name] was at bedside.  Severity of Illness: The appropriate patient status for this patient is INPATIENT. Inpatient status is judged to be reasonable and necessary in order to provide the required intensity of service to ensure the patient's safety. The patient's presenting symptoms, physical exam findings, and initial radiographic and laboratory data in the context of their chronic comorbidities is felt to place them at high risk for further clinical deterioration. Furthermore, it is not anticipated that the patient will be medically stable for discharge from the hospital within 2 midnights of admission.   * I  certify that at the point of admission it is my clinical judgment that the patient will require inpatient hospital care spanning beyond 2 midnights from the point of admission due to  high intensity of service, high risk for further deterioration and high frequency of surveillance required.*  Author: Reubin Milan, MD 07/18/2022 3:43 PM  For on call review [email or web address].   This document was prepared using Dragon voice recognition software and may contain some unintended transcription errors.

## 2022-07-18 NOTE — Progress Notes (Signed)
A consult was received from an ED physician for vanc per pharmacy dosing.  The patient's profile has been reviewed for ht/wt/allergies/indication/available labs.   A one time order has been placed for vanc 1500mg .  Further antibiotics/pharmacy consults should be ordered by admitting physician if indicated.                       Thank you, Kara Mead 07/18/2022  2:29 PM

## 2022-07-18 NOTE — ED Triage Notes (Signed)
Pt BIBA from [facility]. Pt recently had toe amputation at [facility]. Pt was sent home with foley cath.  Today pt presented with AMS with lethargy according to son.  Given 1000 NS  Aox4  BP: 118/70 RR: 26 HR: 110 CBG: 102

## 2022-07-18 NOTE — ED Provider Notes (Signed)
[facility] DEPT Provider Note   CSN: [id number] Arrival date & time: 07/18/22  1228     History  Chief Complaint  Patient presents with  . Code Sepsis    Drystan Reader. is a 81 y.o. male.  HPI   81 year old male presents emergency department with complaints of altered mental status.  Patient's son was contacted regarding patient's altered mentation.  Patient lives at nursing facility and was visited by son around 55 AM this morning.  Son states that patient was speaking incoherently and was not responding appropriately to questions and commands.  Son states that he called EMS immediately who then in turn brought patient to ED emergency department.  Via EMS, patient was given 1000 cc of normal saline and was A&O x4.  Patient reports remembering said event but states he is back to baseline.  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  He states he has been getting bandage changed as directed.  He states he is currently not taking antibiotics but was discharged with doxycycline which is supposed to and in 2 days.  Patient is currently complaining of left lower extremity pain that has not changed since the few days after surgery.  He states he was concerned about movement of the aphasia earlier today.  Denies fever but is afebrile upon presentation.  Patient states that he has had intermittent episodes of diarrhea since previous ED visit as well as had decreased p.o. intake over the past week eating and drinking minimally.  Denies chills, night sweats, chest pain, shortness of breath, abdominal pain, nausea, vomiting, urinary symptoms, change in bowel habits.  Patient has indwelling Foley catheter placement of which son reports he has not been maintained well at living facility.  Denies visual disturbance, gait abnormality, weakness or  sensory deficits, slurred speech, facial droop currently.  Past medical history significant for coronary artery disease, essential hypertension, paroxysmal atrial fibrillation of which she is anticoagulated on Eliquis, peripheral arterial disease, diabetes mellitus type 2, CHF  Home Medications Prior to Admission medications   Medication Sig Start Date End Date Taking? Authorizing Provider  atorvastatin (LIPITOR) 40 MG tablet Take 1 tablet (40 mg total) by mouth daily. 07/13/22  Yes Kathie Dike, MD  ELIQUIS 2.5 MG TABS tablet Take 2.5 mg by mouth 2 (two) times daily. 11/23/21  Yes [provider]  isosorbide mononitrate (IMDUR) 30 MG 24 hr tablet Take 1 tablet (30 mg total) by mouth daily. 07/13/22  Yes Kathie Dike, MD  metFORMIN (GLUCOPHAGE) 1000 MG tablet Take 1,000 mg by mouth at bedtime. 03/31/20  Yes [provider]  metoprolol succinate (TOPROL-XL) 100 MG 24 hr tablet Take 1 tablet (100 mg total) by mouth daily. Take with or immediately following a meal. Patient taking differently: Take 100 mg by mouth daily with supper. Take with or immediately following a meal. 12/19/18  Yes Reino Bellis B, NP  acetaminophen (TYLENOL) 500 MG tablet Take 500 mg by mouth every 6 (six) hours as needed for moderate pain.    [provider]  aspirin EC 81 MG tablet Take 1 tablet (81 mg total) by mouth daily. Swallow whole. Patient not taking: Reported on 07/18/2022 07/13/22   Kathie Dike, MD  blood glucose meter kit and supplies Dispense based on patient and insurance preference. Use up to four times daily as directed. (FOR ICD-10 E10.9, E11.9).  04/18/19   Mikhail, Velta Addison, DO  cefadroxil (DURICEF) 500 MG capsule Take 2 capsules (1,000 mg total) by mouth 2 (two) times daily for 7 days. 07/13/22 07/20/22  Kathie Dike, MD  Cholecalciferol (VITAMIN D3) 50 MCG (2000 UT) capsule Take 2,000 Units by mouth daily. 11/23/21   [provider]  doxazosin (CARDURA) 2 MG tablet  Take 2 mg by mouth daily. 11/30/21   [provider]  doxycycline (VIBRA-TABS) 100 MG tablet Take 1 tablet (100 mg total) by mouth 2 (two) times daily for 7 days. 07/13/22 07/20/22  Kathie Dike, MD  ezetimibe (ZETIA) 10 MG tablet Take 1 tablet (10 mg total) by mouth daily. 07/13/22   Kathie Dike, MD  hydrALAZINE (APRESOLINE) 50 MG tablet Take 1 tablet (50 mg total) by mouth every 8 (eight) hours. 07/13/22   Kathie Dike, MD  insulin detemir (LEVEMIR) 100 UNIT/ML injection Inject 0.18 mLs (18 Units total) into the skin daily. Patient taking differently: Inject 25 Units into the skin daily. 04/19/19   Mikhail, Velta Addison, DO  Insulin Syringe-Needle U-100 (INSULIN SYRINGE .3CC/31GX5/16") 31G X 5/16" 0.3 ML MISC Use with insulin 04/18/19   Cristal Ford, DO  loperamide (IMODIUM) 2 MG capsule Take 1 capsule (2 mg total) by mouth 4 (four) times daily as needed for diarrhea or loose stools. Patient not taking: Reported on 07/18/2022 07/15/22   Mesner, Corene Cornea, MD  polyethylene glycol (MIRALAX / GLYCOLAX) 17 g packet Take 17 g by mouth daily as needed for mild constipation. Patient not taking: Reported on 07/18/2022 07/13/22   Kathie Dike, MD  vitamin B-12 (CYANOCOBALAMIN) 500 MCG tablet Take 500 mcg by mouth daily with breakfast.     [provider]  amLODipine (NORVASC) 10 MG tablet TAKE 1 TABLET DAILY Patient not taking: Reported on 05/20/2020 11/09/19 05/20/20  Lendon Colonel, NP  furosemide (LASIX) 40 MG tablet Take 1 tablet (40 mg total) by mouth daily. For 4 days then stop. 12/15/11 04/26/19  Nani Skillern, PA-C  insulin aspart (NOVOLOG) 100 UNIT/ML injection Inject 0-15 Units into the skin 3 (three) times daily with meals. CBG 121 - 150: 2 units;  CBG 151 - 200: 3 units;  CBG 201 - 250: 5 units;  CBG 251 - 300: 8 units; CBG 301 - 350: 11 units; CBG 351 - 400: 15 units;  CBG > 400 : 15 units and notify MD Patient not taking: Reported on 05/20/2020 04/18/19 05/20/20   Cristal Ford, DO      Allergies    Januvia [sitagliptin] and Statins    Review of Systems   Review of Systems  All other systems reviewed and are negative.   Physical Exam Updated Vital Signs BP (!) 123/52   Pulse 80   Temp 99.5 F (37.5 C) (Oral)   Resp (!) 24   Ht 5' 9"  (1.753 m)   Wt 85.3 kg   SpO2 97%   BMI 27.76 kg/m  Physical Exam Vitals and nursing note reviewed.  Constitutional:      General: He is not in acute distress.    Appearance: Normal appearance. He is well-developed. He is not ill-appearing.  HENT:     Head: Normocephalic and atraumatic.  Eyes:     Extraocular Movements: Extraocular movements intact.     Conjunctiva/sclera: Conjunctivae normal.     Comments: Right pupil slightly smaller than left but both are reactive to light and accommodation.  Cardiovascular:     Rate and Rhythm: Normal rate and regular rhythm.  Heart sounds: No murmur heard. Pulmonary:     Effort: Pulmonary effort is normal. No respiratory distress.     Breath sounds: Rhonchi present.     Comments: Patient diffusely rhonchorous with right being more prominent than left. Abdominal:     Palpations: Abdomen is soft.     Tenderness: There is no abdominal tenderness.  Genitourinary:    Comments: Perianal mild erythema likely secondary to irritated skin from recurrent diarrhea.  Not obviously cellulitic changes in skin.  No obvious gross blood on rectal exam.  No hemorrhoids or fissures palpated.  Stool is dark brown in color with no obvious blood.  Hemoccult was sent with stool sample. Musculoskeletal:        General: No swelling.     Cervical back: Neck supple.     Right lower leg: Edema present.     Left lower leg: Edema present.     Comments: 1+ pitting edema bilateral lower extremities.  Skin:    General: Skin is warm and dry.     Capillary Refill: Capillary refill takes less than 2 seconds.     Comments: Patient was rolled with no evidence of skin breakdown.  Mild  area of redness noted around patient's rectum likely secondary to patient's recurrent diarrhea. Lower extremity wound patient in picture below.  Patient's left lower abdominal incision clean dry and intact with no surrounding erythema noted.    Neurological:     General: No focal deficit present.     Mental Status: He is alert and oriented to person, place, and time. Mental status is at baseline.     GCS: GCS eye subscore is 4. GCS verbal subscore is 5. GCS motor subscore is 6.     Comments: Alert and oriented to self, place, time and event.   Speech is fluent, clear without dysarthria or dysphasia.   Strength 5/5 in upper/lower extremities   Sensation intact in upper/lower extremities   No pronator drift.  Normal finger-to-nose  Patient moves all 4 extremities without difficulty. CN I not tested  CN II grossly intact visual fields bilaterally. Did not visualize posterior eye.  CN III, IV, VI and EOMs intact bilaterally  CN V Intact sensation to sharp and light touch to the face  CN VII facial movements symmetric  CN VIII not tested  CN IX, X no uvula deviation, symmetric rise of soft palate  CN XI 5/5 SCM and trapezius strength bilaterally  CN XII Midline tongue protrusion, symmetric L/R movements     Psychiatric:        Mood and Affect: Mood normal.          ED Results / Procedures / Treatments   Labs (all labs ordered are listed, but only abnormal results are displayed) Labs Reviewed  COMPREHENSIVE METABOLIC PANEL - Abnormal; Notable for the following components:      Result Value   Sodium 126 (*)    Chloride 96 (*)    CO2 20 (*)    Glucose, Bld 62 (*)    BUN 40 (*)    Creatinine, Ser 2.38 (*)    Calcium 7.6 (*)    Total Protein 6.3 (*)    Albumin 2.7 (*)    AST 53 (*)    Total Bilirubin 1.5 (*)    GFR, Estimated 26 (*)    All other components within normal limits  CBC WITH DIFFERENTIAL/PLATELET - Abnormal; Notable for the following components:   RBC 2.63  (*)    Hemoglobin  8.0 (*)    HCT 23.9 (*)    RDW 16.3 (*)    Platelets 111 (*)    Lymphs Abs 0.5 (*)    All other components within normal limits  PROTIME-INR - Abnormal; Notable for the following components:   Prothrombin Time 17.4 (*)    INR 1.4 (*)    All other components within normal limits  URINALYSIS, ROUTINE W REFLEX MICROSCOPIC - Abnormal; Notable for the following components:   APPearance HAZY (*)    Hgb urine dipstick MODERATE (*)    Protein, ur 100 (*)    Leukocytes,Ua MODERATE (*)    All other components within normal limits  CBG MONITORING, ED - Abnormal; Notable for the following components:   Glucose-Capillary 57 (*)    All other components within normal limits  CBG MONITORING, ED - Abnormal; Notable for the following components:   Glucose-Capillary 134 (*)    All other components within normal limits  CBG MONITORING, ED - Abnormal; Notable for the following components:   Glucose-Capillary 59 (*)    All other components within normal limits  POC OCCULT BLOOD, ED - Abnormal; Notable for the following components:   Fecal Occult Bld POSITIVE (*)    All other components within normal limits  RESP PANEL BY RT-PCR (FLU A&B, COVID) ARPGX2  CULTURE, BLOOD (ROUTINE X 2)  CULTURE, BLOOD (ROUTINE X 2)  URINE CULTURE  LACTIC ACID, PLASMA  LACTIC ACID, PLASMA  COMPREHENSIVE METABOLIC PANEL  CBC  CBG MONITORING, ED  CBG MONITORING, ED    EKG None  Radiology VAS [place] LOWER EXTREMITY ARTERIAL DUPLEX  Result Date: 07/18/2022 LOWER EXTREMITY ARTERIAL DUPLEX STUDY Patient Name:  Faizon Capozzi.  Date of Exam:   07/18/2022 Medical Rec #: [id number]         Accession #:    [id number] Date of Birth: [birth year]          Patient Gender: M Patient Age:   17 years Exam Location:  [facility] Procedure:      VAS [place] LOWER EXTREMITY ARTERIAL DUPLEX Referring Phys: Jamelle Haring --------------------------------------------------------------------------------  Indications:  Cellulitis about left calf incision. High Risk Factors: Hypertension, hyperlipidemia, Diabetes.  Current ABI: Limitations: Incision, staples Comparison Study: No prior studies. Performing Technologist: Oliver Hum RVT  Examination Guidelines: A complete evaluation includes B-mode imaging, spectral Doppler, color Doppler, and power Doppler as needed of all accessible portions of each vessel. Bilateral testing is considered an integral part of a complete examination. Limited examinations for reoccurring indications may be performed as noted.   Left Graft #1: +--------------------+--------+--------+--------+--------+                     PSV cm/sStenosisWaveformComments +--------------------+--------+--------+--------+--------+ Inflow                                               +--------------------+--------+--------+--------+--------+ Proximal Anastomosis                                 +--------------------+--------+--------+--------+--------+ Proximal Graft                                       +--------------------+--------+--------+--------+--------+ Mid Graft                                            +--------------------+--------+--------+--------+--------+  Distal Graft        55                               +--------------------+--------+--------+--------+--------+ Distal Anastomosis  192                              +--------------------+--------+--------+--------+--------+ Outflow                                              +--------------------+--------+--------+--------+--------+ There is an area of mixed echogenicity adjacent to the bypass graft in the mid calf.  Summary: Left: Widely patent graft. There is an area of mixed echogenicity adjacent to the bypass graft in the mid calf.  See table(s) above for measurements and observations.    Preliminary    CT HEAD WO CONTRAST  Result Date: 07/18/2022 CLINICAL DATA:  Mental status change, unknown  cause EXAM: CT HEAD WITHOUT CONTRAST TECHNIQUE: Contiguous axial images were obtained from the base of the skull through the vertex without intravenous contrast. RADIATION DOSE REDUCTION: This exam was performed according to the departmental dose-optimization program which includes automated exposure control, adjustment of the mA and/or kV according to patient size and/or use of iterative reconstruction technique. COMPARISON:  05/20/2020 FINDINGS: Brain: No evidence of acute infarction, hemorrhage, hydrocephalus, extra-axial collection or mass lesion/mass effect. Scattered low-density changes within the periventricular and subcortical white matter compatible with chronic microvascular ischemic change. Mild diffuse cerebral volume loss. Vascular: Atherosclerotic calcifications involving the large vessels of the skull base. No unexpected hyperdense vessel. Skull: Normal. Negative for fracture or focal lesion. Sinuses/Orbits: Mild mucosal thickening within the paranasal sinuses. Other: None. IMPRESSION: 1. No acute intracranial abnormality. 2. Chronic microvascular ischemic change and cerebral volume loss. Electronically Signed   By: Davina Poke D.O.   On: 07/18/2022 13:43   DG Chest 2 View  Result Date: 07/18/2022 CLINICAL DATA:  Suspected sepsis. EXAM: CHEST - 2 VIEW COMPARISON:  January 08, 2022 FINDINGS: The patient is status post CABG. Heart size is mildly enlarged but stable. The hila and mediastinum are normal. No pneumothorax. No nodules or masses. No focal infiltrates. IMPRESSION: No cause for sepsis identified. Electronically Signed   By: Dorise Bullion III M.D.   On: 07/18/2022 13:27    Procedures .Critical Care  Performed by: Wilnette Kales, PA Authorized by: Wilnette Kales, PA   Critical care provider statement:    Critical care time (minutes):  45   Critical care was necessary to treat or prevent imminent or life-threatening deterioration of the following conditions:  Sepsis    Critical care was time spent personally by me on the following activities:  Development of treatment plan with patient or surrogate, discussions with consultants, evaluation of patient's response to treatment, examination of patient, ordering and review of laboratory studies, ordering and review of radiographic studies, ordering and performing treatments and interventions, pulse oximetry, re-evaluation of patient's condition and review of old charts   I assumed direction of critical care for this patient from another provider in my specialty: no     Care discussed with: admitting provider and accepting provider at another facility       Medications Ordered in ED Medications  dextrose 50 % solution (has no administration in time range)  dextrose 5 %-0.9 % sodium chloride infusion ( Intravenous New Bag/Given 07/18/22 1640)  dextrose 50 % solution 12.5 g (has no administration in time range)  acetaminophen (TYLENOL) tablet 650 mg (has no administration in time range)    Or  acetaminophen (TYLENOL) suppository 650 mg (has no administration in time range)  prochlorperazine (COMPAZINE) injection 5 mg (has no administration in time range)  LORazepam (ATIVAN) injection 0.5 mg (has no administration in time range)  dextrose 50 % solution 50 mL (50 mLs Intravenous Given 07/18/22 1253)  acetaminophen (TYLENOL) tablet 650 mg (650 mg Oral Given 07/18/22 1306)  lactated ringers bolus 1,000 mL (0 mLs Intravenous Stopped 07/18/22 1351)  cefTRIAXone (ROCEPHIN) 2 g in sodium chloride 0.9 % 100 mL IVPB (0 g Intravenous Stopped 07/18/22 1534)  pantoprazole (PROTONIX) injection 40 mg (40 mg Intravenous Given 07/18/22 1448)  vancomycin (VANCOREADY) IVPB 1500 mg/300 mL (1,500 mg Intravenous New Bag/Given 07/18/22 1455)  dextrose 50 % solution 50 mL (50 mLs Intravenous Given 07/18/22 1525)    ED Course/ Medical Decision Making/ A&P Clinical Course as of 07/18/22 1745  Sun Jul 18, 2022  1416 Consulted vascular surgeon  Dr. Standley Dakins he recommended admission to [facility]. [CR]  1451 Consulted GI NP, Carl Best who agreed with admission of the patient.  Stated to determine whether or not [facility] or [facility] to be consulted depending on timing of transfer. [CR]  1502 [facility] Dr. Olevia Bowens regarding the patient.  He agreed with admission of the patient to [facility] and assume further treatment/care. [CR]  33 Consult Dr. Paulita Fujita of [facility] regarding the patient since he is staying at [facility] before transfer for consultation of the patient. [CR]    Clinical Course User Index [CR] Wilnette Kales, PA                           Medical Decision Making Amount and/or Complexity of Data Reviewed Labs: ordered. Radiology: ordered.  Risk OTC drugs. Prescription drug management. Decision regarding hospitalization.   This patient presents to the ED for concern of altered mental status/sepsis, this involves an extensive number of treatment options, and is a complaint that carries with it a high risk of complications and morbidity.  The differential diagnosis includes sepsis, urinary tract infection, cellulitis, infected graft, CVA, TIA   Co morbidities that complicate the patient evaluation  See HPI   Additional history obtained:  Additional history obtained from EMR External records from outside source obtained and reviewed including prior surgical report from 07/07/2022   Lab Tests:  I Ordered, and personally interpreted labs.  The pertinent results include: No leukocytosis.  Anemia with a hemoglobin 8.0 which is decreased from patient's baseline around 9.  Platelets within normal range.  Multiple electrolyte abnormalities including sodium of 126, chloride 96, bicarb of 20 likely secondary to patient's dehydrated status due to poor p.o. intake as well as fluid loss through recurrent diarrhea.  Patient has continued AKI with a GFR of 26, BUN of 40 and creatinine of 2.38.  No  concerning transaminitis noted.  Patient's UA concerning for urinary tract infection with moderate leukocytes, 21-50 WBC with a 0 bacteria seen.  We will treat empirically for cellulitis as well as urine as potential sources for patient's septic presentation.  Urine culture sent.  Patient was at 21 to 50 RBC in urine.  Respiratory viral panel negative.  Hemoccult positive.  Blood cultures pending.  Initial  blood glucose of 60s to, dextrose was given with quick response of blood glucose.  Patient took insulin this morning without food intake so could be recurring problem.   Imaging Studies ordered:  I ordered imaging studies including CT head, chest x-ray, MR brain I independently visualized and interpreted imaging which showed  CT head: No acute intracranial abnormality.  Chronic microvascular ischemic changes and cerebral volume loss. Chest x-ray: No acute cardiopulmonary abnormality MR brain: Pending upon admission I agree with the radiologist interpretation  Cardiac Monitoring: / EKG:  The patient was maintained on a cardiac monitor.  I personally viewed and interpreted the cardiac monitored which showed an underlying rhythm of: Sinus rhythm   Consultations Obtained:  See ED course  Problem List / ED Course / Critical interventions / Medication management  Sepsis/melena I ordered medication including an milliliters of lactated Ringer's for rehydration given EMS had already given 1 L of normal saline.  Empiric antibiotic coverage of Rocephin as well as vancomycin for sepsis secondary to presumed cellulitis/urinary tract infection.  Protonix for GI bleed.   Reevaluation of the patient after these medicines showed that the patient improved I have reviewed the patients home medicines and have made adjustments as needed   Social Determinants of Health:  Denies tobacco, licit drug use   Test / Admission - Considered:  Sepsis/urinary tract infection/cellulitis/melena Vitals signs  significant for initially febrile with a temperature greater than 101 F and tachypnea with respiratory rate greater than 24.  Temperature decreased administration of Tylenol.. Otherwise within normal range and stable throughout visit. Laboratory/imaging studies significant for: See above Patient deemed to meet hospital admission criteria given current meeting of sepsis criteria with presumed source of infection of cellulitis/infected graft of left lower extremity and/or urinary tract infection.  Patient placed on broad-spectrum antibiotics to cover both potential sources.  Given patient's transient alteration of consciousness, CT imaging was ordered with follow-up tomorrow to rule out potential TIA versus CVA versus versus hyponatremia versus sepsis.  Proper consultations were made while in the ED.  Treatment plan was discussed at length with patient and family and they acknowledge understanding was agreeable to said plan.  Patient was stable upon admission to the hospital.         Final Clinical Impression(s) / ED Diagnoses Final diagnoses:  Sepsis, due to unspecified organism, unspecified whether acute organ dysfunction present ([place])  AKI (acute kidney injury) ([place])  Cellulitis of left lower extremity  Melena  Acute cystitis with hematuria  Transient alteration of awareness    Rx / DC Orders ED Discharge Orders          Ordered    Type and screen       Comments: [facility]    07/18/22 [place], [place], [place] 07/18/22 1746    Elgie Congo, MD 07/19/22 [phone]

## 2022-07-18 NOTE — ED Notes (Signed)
Bladder scanned pt due to him c/o feeling like he needs to urinate but being unable to void. Bladder scan showed 58mL in bladder. RN notified.

## 2022-07-18 NOTE — Progress Notes (Signed)
Pharmacy Antibiotic Note  Steve [name]. is a 81 y.o. male admitted on 07/18/2022 with cellulitis.  Pharmacy has been consulted for Vancomycin dosing.  Active Problem(s):  AMS with urinary tract infection, hyponatremia, melena, cellulitis about left calf incision.  - 9/24 Doppler: Left: Widely patent graft.  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  He underwent angiogram and left common femoral to peroneal artery bypass with me on 07/07/2022.  He then underwent a left fifth toe partial ray amputation with podiatry.  He was discharged with an indwelling Foley for urinary retention.   ID: Cellulitis. - Tmax 100.5. WBC 8.5. Scr 2.38  Vanco 9/24>> Rocephin 9/24>>  Plan: Vanco 1500mg  IV x 1 Vancomycin 1500 mg IV Q 48 hrs. Goal AUC 400-550. Expected AUC: 510 SCr used: 2.38    Height: 5\' 9"  (175.3 cm) Weight: 85.3 kg (188 lb) IBW/kg (Calculated) : 70.7  Temp (24hrs), Avg:100 F (37.8 C), Min:99.5 F (37.5 C), Max:100.5 F (38.1 C)  Recent Labs  Lab 07/12/22 1203 07/13/22 0421 07/14/22 1301 07/15/22 0430 07/18/22 1252 07/18/22 1532  WBC 7.8 6.0 12.1*  --  8.5  --   CREATININE 1.35* 1.38* 2.26* 2.13* 2.38*  --   LATICACIDVEN  --   --   --   --  1.7 1.0    Estimated Creatinine Clearance: 25.4 mL/min (A) (by C-G formula based on SCr of 2.38 mg/dL (H)).    Allergies  Allergen Reactions   Januvia [Sitagliptin] Other (See Comments)    Soreness to stomach area. Intolerance    Statins Other (See Comments)    Myalgias, muscle weakness, swelling, pain- prescribed Lipitor in 06/2022 (??)    Kyro Joswick S. Alford Highland, PharmD, BCPS Clinical Staff Pharmacist [email or web address] [place] 07/18/2022 6:51 PM

## 2022-07-18 NOTE — ED Notes (Signed)
Patient transported to MRI 

## 2022-07-18 NOTE — Consult Note (Signed)
[facility] [place]  ASSESSMENT / PLAN: 81 y.o. male with urinary tract infection, hyponatremia, melena, cellulitis about left calf incision.  He is status post left common femoral to peroneal bypass with PTFE on 07/07/2022.  We will check a arterial duplex of the left lower extremity to evaluate for perigraft collection.  If he has periprosthetic infection, he will need graft excision.  I counseled patient's Steve [name] that this would be a limb threatening problem.  Recommend broad-spectrum antibiotics.  I will follow closely along.  CHIEF COMPLAINT: Altered mental status  HISTORY OF PRESENT ILLNESS: Steve [name]. is a 81 y.o. male who presents to [facility], ER for evaluation of altered mental status.  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  He underwent angiogram and left common femoral to peroneal artery bypass with me on 07/07/2022.  He then underwent a left fifth toe partial ray amputation with podiatry.  He was discharged with an indwelling Foley for urinary retention.  His Steve [name] visited him this morning and found him to be incoherent.  The patient was transferred for evaluation of altered mental status.  The patient is awake, but not oriented.  All history is per discussion with his Steve [name].  He is not sure when the redness about the patient's calf began.  Past Medical History:  Diagnosis Date   CAD (coronary artery disease)    a. s/p CABG x 29 Nov 2011 with LIMA to LAD, SVG to OM1 and distal LCX, SVG to ramus intermediate   Essential hypertension    Hyperlipidemia    Intermittent claudication ([place])    Ischemic cardiomyopathy    a. 03/2015 Echo: EF45-50%, Gr1 DD, mild MR.   PAF (paroxysmal atrial fibrillation) ([place])    a. post op atrial fib 11/2011; short course of amiodarone; stopped 01/25/12   Peripheral arterial disease ([place])    Right bundle branch block    Type II diabetes  [facility])     Past Surgical History:  Procedure Laterality Date   ABDOMINAL AORTOGRAM W/LOWER EXTREMITY Left 07/02/2022   Procedure: ABDOMINAL AORTOGRAM W/LOWER EXTREMITY;  Surgeon: Cherre Robins, MD;  Location: [place] [facility];  Service: Cardiovascular;  Laterality: Left;   AMPUTATION Left 07/09/2022   Procedure: PARTIAL LEFT FIFTH RAY AMPUTATION;  Surgeon: Edrick Kins, DPM;  Location: [employer] [facility];  Service: Podiatry;  Laterality: Left;   BYPASS GRAFT FEMORAL-PERONEAL Left 07/07/2022   Procedure: LEFT FEMORAL-PERONEAL ARTERY BYPASS;  Surgeon: Cherre Robins, MD;  Location: [facility] OR;  Service: Vascular;  Laterality: Left;   CARDIAC CATHETERIZATION  01/16/1999   Est. EF of 65% -- Nonobstruction atherosclerotic coronary artery disease -- Normal left ventricular function       CARDIAC ELECTROPHYSIOLOGY STUDY AND ABLATION  05/21/1999   Normal sinus funtion -- Mildly prolonged interatrial conduction times -- Normal A-V node funtion -- Normal His Purkinje system function -- fNo accessory pathway -- No inducible ventricular tachycardia in the presence of the or in the absence of isoproterenol with programmed stimulation or with burst pacing -- Nikki Dom, M.D. [place]   CORONARY ARTERY BYPASS GRAFT  12/10/2011   Procedure: CORONARY ARTERY BYPASS GRAFTING (CABG);  Surgeon: Tharon Aquas Adelene Idler, MD;  Location: [place];  Service: Open Heart Surgery;  Laterality: N/A;  Coronary Artery bypass graft on pump times four utilizing  left internal mammary artery and left saphenous vein harvested endoscopically   LEFT HEART CATH AND CORS/GRAFTS ANGIOGRAPHY N/A 12/18/2018   Procedure: LEFT HEART CATH AND CORS/GRAFTS ANGIOGRAPHY;  Surgeon: Belva Crome, MD;  Location: [place] [facility];  Service: Cardiovascular;  Laterality: N/A;   LEFT HEART CATHETERIZATION WITH CORONARY ANGIOGRAM N/A 12/09/2011   Procedure: LEFT HEART CATHETERIZATION WITH CORONARY ANGIOGRAM;  Surgeon:  Peter M Martinique, MD;  Location: [facility] CATH LAB;  Service: Cardiovascular;  Laterality: N/A;   LOWER EXTREMITY ANGIOGRAM Left 07/28/2018   Procedure: Lower Extremity Angiogram;  Surgeon: Angelia Mould, MD;  Location: [place] [facility];  Service: Cardiovascular;  Laterality: Left;    Family History  Problem Relation Age of Onset   Stroke Mother 84       Cerebellar hemorrhage    Social History   Socioeconomic History   Marital status: Widowed    Spouse name: Not on file   Number of children: 1   Years of education: Not on file   Highest education level: Not on file  Occupational History   Occupation: Retail buyer    Comment: retired  Tobacco Use   Smoking status: Former    Packs/day: 1.50    Years: 20.00    Total pack years: 30.00    Types: Cigarettes    Quit date: 05/27/1979    Years since quitting: 43.1   Smokeless tobacco: Former  Scientific laboratory technician Use: Never used  Substance and Sexual Activity   Alcohol use: Yes    Alcohol/week: 4.0 standard drinks of alcohol    Types: 4 Shots of liquor per week    Comment: 4 "canadian clubs each night"   Drug use: No   Sexual activity: Yes  Other Topics Concern   Not on file  Social History Narrative   Not on file   Social Determinants of Health   Financial Resource Strain: Not on file  Food Insecurity: No Food Insecurity (07/01/2022)   Hunger Vital Sign    Worried About Running Out of Food in the Last Year: Never true    Ran Out of Food in the Last Year: Never true  Transportation Needs: No Transportation Needs (07/01/2022)   PRAPARE - Hydrologist (Medical): No    Lack of Transportation (Non-Medical): No  Physical Activity: Not on file  Stress: Not on file  Social Connections: Not on file  Intimate Partner Violence: Not At Risk (07/01/2022)   Humiliation, Afraid, Rape, and Kick questionnaire    Fear of Current or Ex-Partner: No    Emotionally Abused: No    Physically Abused: No     Sexually Abused: No    Allergies  Allergen Reactions   Januvia [Sitagliptin] Other (See Comments)    Soreness to stomach area. Intolerance    Statins Other (See Comments)    Myalgias, muscle weakness, swelling, pain    Current Facility-Administered Medications  Medication Dose Route Frequency Provider Last Rate Last Admin   cefTRIAXone (ROCEPHIN) 2 g in sodium chloride 0.9 % 100 mL IVPB  2 g Intravenous Once Dion Saucier A, PA 200 mL/hr at 07/18/22 1433 2 g at 07/18/22 1433   pantoprazole (PROTONIX) injection 40 mg  40 mg Intravenous Once Dion Saucier A, PA       vancomycin (VANCOREADY) IVPB 1500 mg/300 mL  1,500 mg Intravenous Once Adrian Saran, [facility]       Current Outpatient Medications  Medication Sig Dispense Refill  acetaminophen (TYLENOL) 500 MG tablet Take 500 mg by mouth every 6 (six) hours as needed for moderate pain.     aspirin EC 81 MG tablet Take 1 tablet (81 mg total) by mouth daily. Swallow whole. 30 tablet 12   atorvastatin (LIPITOR) 40 MG tablet Take 1 tablet (40 mg total) by mouth daily. 30 tablet 1   blood glucose meter kit and supplies Dispense based on patient and insurance preference. Use up to four times daily as directed. (FOR ICD-10 E10.9, E11.9). 1 each 1   cefadroxil (DURICEF) 500 MG capsule Take 2 capsules (1,000 mg total) by mouth 2 (two) times daily for 7 days. 28 capsule 0   Cholecalciferol (VITAMIN D3) 50 MCG (2000 UT) capsule Take 2,000 Units by mouth daily.     doxazosin (CARDURA) 2 MG tablet Take 2 mg by mouth daily.     doxycycline (VIBRA-TABS) 100 MG tablet Take 1 tablet (100 mg total) by mouth 2 (two) times daily for 7 days. 14 tablet 0   ELIQUIS 2.5 MG TABS tablet Take 2.5 mg by mouth 2 (two) times daily.     ezetimibe (ZETIA) 10 MG tablet Take 1 tablet (10 mg total) by mouth daily. 90 tablet 3   hydrALAZINE (APRESOLINE) 50 MG tablet Take 1 tablet (50 mg total) by mouth every 8 (eight) hours. 90 tablet 1   insulin detemir (LEVEMIR) 100  UNIT/ML injection Inject 0.18 mLs (18 Units total) into the skin daily. (Patient taking differently: Inject 25 Units into the skin daily.) 10 mL 2   Insulin Syringe-Needle U-100 (INSULIN SYRINGE .3CC/31GX5/16") 31G X 5/16" 0.3 ML MISC Use with insulin 100 each 2   isosorbide mononitrate (IMDUR) 30 MG 24 hr tablet Take 1 tablet (30 mg total) by mouth daily. 30 tablet 0   loperamide (IMODIUM) 2 MG capsule Take 1 capsule (2 mg total) by mouth 4 (four) times daily as needed for diarrhea or loose stools. 12 capsule 0   metFORMIN (GLUCOPHAGE) 1000 MG tablet Take 1,000 mg by mouth daily.     metoprolol succinate (TOPROL-XL) 100 MG 24 hr tablet Take 1 tablet (100 mg total) by mouth daily. Take with or immediately following a meal. (Patient taking differently: Take 100 mg by mouth daily with supper. Take with or immediately following a meal.) 30 tablet 3   polyethylene glycol (MIRALAX / GLYCOLAX) 17 g packet Take 17 g by mouth daily as needed for mild constipation. 14 each 0   vitamin B-12 (CYANOCOBALAMIN) 500 MCG tablet Take 500 mcg by mouth daily with breakfast.       PHYSICAL EXAM Vitals:   07/18/22 1259 07/18/22 1354 07/18/22 1400 07/18/22 1430  BP:  (!) 124/53 (!) 128/52 (!) 119/54  Pulse:  85 83 79  Resp:  20 17 (!) 21  Temp:      TempSrc:      SpO2:  98% 98% 98%  Weight: 85.3 kg     Height: _0  (1.753 m)      Elderly gentleman.  Awake, but not oriented. Regular rate and rhythm Unlabored breathing Left leg groin incision clean, dry, and intact.  Steri-Strips over incision.  Left calf incision with cellulitis about the inferior aspect.  Foot is warm and well-perfused.  No Doppler available.  Left toe amputation site healing appropriately with Prolene suture.  PERTINENT LABORATORY AND RADIOLOGIC DATA  Most recent CBC    Latest Ref Rng & Units 07/18/2022   12:52 PM 07/14/2022    1:01 PM 07/13/2022  4:21 AM  CBC  WBC 4.0 - 10.5 K/uL 8.5  12.1  6.0   Hemoglobin 13.0 - 17.0 g/dL 8.0  9.9   8.9   Hematocrit 39.0 - 52.0 % 23.9  30.2  26.4   Platelets 150 - 400 K/uL 111  133  100      Most recent CMP    Latest Ref Rng & Units 07/18/2022   12:52 PM 07/15/2022    4:30 AM 07/14/2022    1:01 PM  CMP  Glucose 70 - 99 mg/dL 62  138  205   BUN 8 - 23 mg/dL 40  40  35   Creatinine 0.61 - 1.24 mg/dL 2.38  2.13  2.26   Sodium 135 - 145 mmol/L 126  131  132   Potassium 3.5 - 5.1 mmol/L 4.5  3.8  4.4   Chloride 98 - 111 mmol/L 96  97  97   CO2 22 - 32 mmol/L _0 Calcium 8.9 - 10.3 mg/dL 7.6  7.4  7.8   Total Protein 6.5 - 8.1 g/dL 6.3   6.7   Total Bilirubin 0.3 - 1.2 mg/dL 1.5   1.5   Alkaline Phos 38 - 126 U/L 56   45   AST 15 - 41 U/L 53   27   ALT 0 - 44 U/L 21   14     Renal function Estimated Creatinine Clearance: 25.4 mL/min (A) (by C-G formula based on SCr of 2.38 mg/dL (H)).  Hgb A1c MFr Bld (%)  Date Value  06/25/2022 7.9 (H)    LDL Cholesterol  Date Value Ref Range Status  07/03/2022 47 0 - 99 mg/dL Final    Comment:           Total Cholesterol/HDL:CHD Risk Coronary Heart Disease Risk Table                     Men   Women  1/2 Average Risk   3.4   3.3  Average Risk       5.0   4.4  2 X Average Risk   9.6   7.1  3 X Average Risk  23.4   11.0        Use the calculated Patient Ratio above and the CHD Risk Table to determine the patient's CHD Risk.        ATP III CLASSIFICATION (LDL):  <100     mg/dL   Optimal  100-129  mg/dL   Near or Above                    Optimal  130-159  mg/dL   Borderline  160-189  mg/dL   High  >190     mg/dL   Very High Performed at [facility] [address] [place], [place] [address]    Direct LDL  Date Value Ref Range Status  02/25/2012 112.4 mg/dL Final    Comment:    Optimal:  <100 mg/dLNear or Above Optimal:  100-129 mg/dLBorderline High:  130-159 mg/dLHigh:  160-189 mg/dLVery High:  >190 mg/dL    [name], MD [facility] Phone Number: [phone] 07/18/2022 2:37 PM  Total time spent on preparing this encounter including chart review, data review, collecting history, examining the patient, coordinating care for this established patient, 40 minutes.  Portions of this report may have been transcribed using voice recognition  software.  Every effort has been made to ensure accuracy; however, inadvertent computerized transcription errors may still be present.

## 2022-07-18 NOTE — Progress Notes (Signed)
Right lower extremity arterial duplex has been completed. Preliminary results can be found in CV Proc through chart review.  Results were given to Dr. Stanford Breed.  07/18/22 3:08 PM Steve [name] RVT

## 2022-07-18 NOTE — ED Notes (Signed)
Pt continually tells staff he needs to urinate. Reminded pt that he has a foley cath and can just pee as needed. Pt says he is trying to void but nothing is coming out. RN notified.

## 2022-07-19 DIAGNOSIS — L03116 Cellulitis of left lower limb: Secondary | ICD-10-CM | POA: Diagnosis not present

## 2022-07-19 LAB — COMPREHENSIVE METABOLIC PANEL
ALT: 18 U/L (ref 0–44)
AST: 25 U/L (ref 15–41)
Albumin: 2.4 g/dL — ABNORMAL LOW (ref 3.5–5.0)
Alkaline Phosphatase: 44 U/L (ref 38–126)
Anion gap: 11 (ref 5–15)
BUN: 39 mg/dL — ABNORMAL HIGH (ref 8–23)
CO2: 18 mmol/L — ABNORMAL LOW (ref 22–32)
Calcium: 7.2 mg/dL — ABNORMAL LOW (ref 8.9–10.3)
Chloride: 99 mmol/L (ref 98–111)
Creatinine, Ser: 2.36 mg/dL — ABNORMAL HIGH (ref 0.61–1.24)
GFR, Estimated: 27 mL/min — ABNORMAL LOW (ref >60)
Glucose, Bld: 127 mg/dL — ABNORMAL HIGH (ref 70–99)
Potassium: 3.5 mmol/L (ref 3.5–5.1)
Sodium: 128 mmol/L — ABNORMAL LOW (ref 135–145)
Total Bilirubin: 0.7 mg/dL (ref 0.3–1.2)
Total Protein: 5.4 g/dL — ABNORMAL LOW (ref 6.5–8.1)

## 2022-07-19 LAB — URINE CULTURE: Culture: NO GROWTH

## 2022-07-19 LAB — CBG MONITORING, ED
Glucose-Capillary: 104 mg/dL — ABNORMAL HIGH (ref 70–99)
Glucose-Capillary: 110 mg/dL — ABNORMAL HIGH (ref 70–99)
Glucose-Capillary: 112 mg/dL — ABNORMAL HIGH (ref 70–99)
Glucose-Capillary: 115 mg/dL — ABNORMAL HIGH (ref 70–99)
Glucose-Capillary: 132 mg/dL — ABNORMAL HIGH (ref 70–99)
Glucose-Capillary: 68 mg/dL — ABNORMAL LOW (ref 70–99)
Glucose-Capillary: 88 mg/dL (ref 70–99)
Glucose-Capillary: 95 mg/dL (ref 70–99)

## 2022-07-19 LAB — CBC
HCT: 21.7 % — ABNORMAL LOW (ref 39.0–52.0)
Hemoglobin: 7.2 g/dL — ABNORMAL LOW (ref 13.0–17.0)
MCH: 30.4 pg (ref 26.0–34.0)
MCHC: 33.2 g/dL (ref 30.0–36.0)
MCV: 91.6 fL (ref 80.0–100.0)
Platelets: 94 10*3/uL — ABNORMAL LOW (ref 150–400)
RBC: 2.37 MIL/uL — ABNORMAL LOW (ref 4.22–5.81)
RDW: 16.6 % — ABNORMAL HIGH (ref 11.5–15.5)
WBC: 8.2 10*3/uL (ref 4.0–10.5)
nRBC: 0 % (ref 0.0–0.2)

## 2022-07-19 LAB — GLUCOSE, CAPILLARY
Glucose-Capillary: 164 mg/dL — ABNORMAL HIGH (ref 70–99)
Glucose-Capillary: 157 mg/dL — ABNORMAL HIGH (ref 70–99)

## 2022-07-19 MED ORDER — PANTOPRAZOLE SODIUM 40 MG IV SOLR
40.0000 mg | Freq: Two times a day (BID) | INTRAVENOUS | Status: DC
  Administered 2022-07-19 – 2022-07-22 (×7): 40 mg via INTRAVENOUS
  Filled 2022-07-19 (×8): qty 10

## 2022-07-19 NOTE — Plan of Care (Signed)
Duplex reviewed. Small fluid collection about distal graft. Most likely hematoma. Recommend IV Abx for now. [facility]. Will try to avoid any kind of surgical intervention given his frail status. Following along.   Steve [name]. Steve Breed, MD [facility] Phone Number: [phone] 07/19/2022 11:01 AM

## 2022-07-19 NOTE — ED Notes (Signed)
Brantley Stage pt's son would like an update when pt is transported to [facility]. He can be reached at [phone].

## 2022-07-19 NOTE — Consult Note (Signed)
Referring Provider: [facility] Primary Care Physician:  System, Provider Not In Primary Gastroenterologist: [facility]  Reason for Consultation:  Anemia, melena  HPI: Steve [name]. is a 81 y.o. male with medical history significant of CAD, essential hypertension, hyperlipidemia, ischemic cardiomyopathy, paroxysmal atrial fibrillation, right bundle branch block, type 2 diabetes, peripheral arterial disease, intermittent claudication who was recently admitted from 06/25/2022 until 07/13/2022 due to sepsis secondary to cellulitis with left foot osteomyelitis. He presented to the ED 9/24 for AMS and was found to be hypoglycemic.   During patient's last hospitalization baseline hemoglobin ranged from 7.5-10.5 but most recently were stable around 9.  On presentation to the ED patient's hemoglobin had dropped to 8.0, this morning hemoglobin 7.2.  Positive Hemoccult in the ED.  On admission patient also had elevated BUN of 40 creatinine 2.38.  Baseline BUN around 20, creatinine 1.5.   Patient reports for the last week he has had abnormally dark stools.  He denies iron pills or Pepto-Bismol use.  He has not had dark stools like this previously.  He was having 5-6 dark bowel movements daily.  Denies excessive fatigue or weakness.  He is not taking any PPI or H2 blocker at home.  Denies NSAID use.  He is taking Eliquis for A-fib, last dose yesterday evening.  Denies hematochezia, unintentional weight loss.  Denies abdominal pain, nausea, vomiting, constipation, hematemesis.   Denies alcohol, smoking and drug use.  Colonoscopy 09/2019 with Dr. Penelope Coop 3 tubulovillous adenomas, 6 tubular adenomas, recommended recall 3 years  EGD 06/2009 with Dr. Wynetta Emery heme positive stools, normal.   Past Medical History:  Diagnosis Date   CAD (coronary artery disease)    a. s/p CABG x 29 Nov 2011 with LIMA to LAD, SVG to OM1 and distal LCX, SVG to ramus intermediate   Essential hypertension    Hyperlipidemia    Intermittent  claudication ([place])    Ischemic cardiomyopathy    a. 03/2015 Echo: EF45-50%, Gr1 DD, mild MR.   PAF (paroxysmal atrial fibrillation) ([place])    a. post op atrial fib 11/2011; short course of amiodarone; stopped 01/25/12   Peripheral arterial disease ([place])    Right bundle branch block    Type II diabetes mellitus [facility])     Past Surgical History:  Procedure Laterality Date   ABDOMINAL AORTOGRAM W/LOWER EXTREMITY Left 07/02/2022   Procedure: ABDOMINAL AORTOGRAM W/LOWER EXTREMITY;  Surgeon: Cherre Robins, MD;  Location: [place] [facility];  Service: Cardiovascular;  Laterality: Left;   AMPUTATION Left 07/09/2022   Procedure: PARTIAL LEFT FIFTH RAY AMPUTATION;  Surgeon: Edrick Kins, DPM;  Location: [place];  Service: Podiatry;  Laterality: Left;   BYPASS GRAFT FEMORAL-PERONEAL Left 07/07/2022   Procedure: LEFT FEMORAL-PERONEAL ARTERY BYPASS;  Surgeon: Cherre Robins, MD;  Location: [facility] OR;  Service: Vascular;  Laterality: Left;   CARDIAC CATHETERIZATION  01/16/1999   Est. EF of 65% -- Nonobstruction atherosclerotic coronary artery disease -- Normal left ventricular function       CARDIAC ELECTROPHYSIOLOGY STUDY AND ABLATION  05/21/1999   Normal sinus funtion -- Mildly prolonged interatrial conduction times -- Normal A-V node funtion -- Normal His Purkinje system function -- fNo accessory pathway -- No inducible ventricular tachycardia in the presence of the or in the absence of isoproterenol with programmed stimulation or with burst pacing -- Nikki Dom, M.D. [place]   CORONARY ARTERY BYPASS GRAFT  12/10/2011  Procedure: CORONARY ARTERY BYPASS GRAFTING (CABG);  Surgeon: Tharon Aquas Adelene Idler, MD;  Location: [place];  Service: Open Heart Surgery;  Laterality: N/A;  Coronary Artery bypass graft on pump times four utilizing left internal mammary artery and left saphenous vein harvested endoscopically   LEFT HEART CATH AND CORS/GRAFTS ANGIOGRAPHY N/A 12/18/2018    Procedure: LEFT HEART CATH AND CORS/GRAFTS ANGIOGRAPHY;  Surgeon: Belva Crome, MD;  Location: [place] [facility];  Service: Cardiovascular;  Laterality: N/A;   LEFT HEART CATHETERIZATION WITH CORONARY ANGIOGRAM N/A 12/09/2011   Procedure: LEFT HEART CATHETERIZATION WITH CORONARY ANGIOGRAM;  Surgeon: Peter M Martinique, MD;  Location: [facility] CATH LAB;  Service: Cardiovascular;  Laterality: N/A;   LOWER EXTREMITY ANGIOGRAM Left 07/28/2018   Procedure: Lower Extremity Angiogram;  Surgeon: Angelia Mould, MD;  Location: [place] [facility];  Service: Cardiovascular;  Laterality: Left;    Prior to Admission medications   Medication Sig Start Date End Date Taking? Authorizing Provider  acetaminophen (TYLENOL) 500 MG tablet Take 500 mg by mouth every 6 (six) hours as needed for moderate pain.   Yes [provider]  Cholecalciferol (VITAMIN D3) 50 MCG (2000 UT) capsule Take 2,000 Units by mouth daily. 11/23/21  Yes [provider]  doxazosin (CARDURA) 2 MG tablet Take 2 mg by mouth daily. 11/30/21  Yes [provider]  ELIQUIS 2.5 MG TABS tablet Take 2.5 mg by mouth 2 (two) times daily. 11/23/21  Yes [provider]  insulin detemir (LEVEMIR) 100 UNIT/ML injection Inject 0.18 mLs (18 Units total) into the skin daily. Patient taking differently: Inject 25 Units into the skin daily before breakfast. 04/19/19  Yes Mikhail, Velta Addison, DO  isosorbide mononitrate (IMDUR) 30 MG 24 hr tablet Take 1 tablet (30 mg total) by mouth daily. 07/13/22  Yes Kathie Dike, MD  metFORMIN (GLUCOPHAGE) 1000 MG tablet Take 1,000 mg by mouth at bedtime. 03/31/20  Yes [provider]  metoprolol succinate (TOPROL-XL) 100 MG 24 hr tablet Take 1 tablet (100 mg total) by mouth daily. Take with or immediately following a meal. Patient taking differently: Take 100 mg by mouth daily with supper. Take with or immediately following a meal. 12/19/18  Yes Reino Bellis B, NP  pravastatin (PRAVACHOL)  40 MG tablet Take 40 mg by mouth at bedtime.   Yes [provider]  vitamin B-12 (CYANOCOBALAMIN) 500 MCG tablet Take 500 mcg by mouth daily with breakfast.    Yes [provider]  aspirin EC 81 MG tablet Take 1 tablet (81 mg total) by mouth daily. Swallow whole. Patient not taking: Reported on 07/18/2022 07/13/22   Kathie Dike, MD  atorvastatin (LIPITOR) 40 MG tablet Take 1 tablet (40 mg total) by mouth daily. Patient not taking: Reported on 07/18/2022 07/13/22   Kathie Dike, MD  blood glucose meter kit and supplies Dispense based on patient and insurance preference. Use up to four times daily as directed. (FOR ICD-10 E10.9, E11.9). 04/18/19   Mikhail, Velta Addison, DO  cefadroxil (DURICEF) 500 MG capsule Take 2 capsules (1,000 mg total) by mouth 2 (two) times daily for 7 days. Patient not taking: Reported on 07/18/2022 07/13/22 07/20/22  Kathie Dike, MD  doxycycline (VIBRA-TABS) 100 MG tablet Take 1 tablet (100 mg total) by mouth 2 (two) times daily for 7 days. Patient not taking: Reported on 07/18/2022 07/13/22 07/20/22  Kathie Dike, MD  ezetimibe (ZETIA) 10 MG tablet Take 1 tablet (10 mg total) by mouth daily. Patient not taking: Reported on 07/18/2022 07/13/22  Kathie Dike, MD  hydrALAZINE (APRESOLINE) 50 MG tablet Take 1 tablet (50 mg total) by mouth every 8 (eight) hours. Patient not taking: Reported on 07/18/2022 07/13/22   Kathie Dike, MD  Insulin Syringe-Needle U-100 (INSULIN SYRINGE .3CC/31GX5/16") 31G X 5/16" 0.3 ML MISC Use with insulin 04/18/19   Cristal Ford, DO  loperamide (IMODIUM) 2 MG capsule Take 1 capsule (2 mg total) by mouth 4 (four) times daily as needed for diarrhea or loose stools. Patient not taking: Reported on 07/18/2022 07/15/22   Mesner, Corene Cornea, MD  polyethylene glycol (MIRALAX / GLYCOLAX) 17 g packet Take 17 g by mouth daily as needed for mild constipation. Patient not taking: Reported on 07/18/2022 07/13/22   Kathie Dike, MD  amLODipine  (NORVASC) 10 MG tablet TAKE 1 TABLET DAILY Patient not taking: Reported on 05/20/2020 11/09/19 05/20/20  Lendon Colonel, NP  furosemide (LASIX) 40 MG tablet Take 1 tablet (40 mg total) by mouth daily. For 4 days then stop. 12/15/11 04/26/19  Nani Skillern, PA-C  insulin aspart (NOVOLOG) 100 UNIT/ML injection Inject 0-15 Units into the skin 3 (three) times daily with meals. CBG 121 - 150: 2 units;  CBG 151 - 200: 3 units;  CBG 201 - 250: 5 units;  CBG 251 - 300: 8 units; CBG 301 - 350: 11 units; CBG 351 - 400: 15 units;  CBG > 400 : 15 units and notify MD Patient not taking: Reported on 05/20/2020 04/18/19 05/20/20  Cristal Ford, DO    Scheduled Meds:  isosorbide mononitrate  30 mg Oral Daily   metoprolol succinate  100 mg Oral Q supper   Continuous Infusions:  cefTRIAXone (ROCEPHIN)  IV     dextrose 5 % and 0.9% NaCl 75 mL/hr at 07/18/22 1640   [START ON 07/20/2022] vancomycin     PRN Meds:.acetaminophen **OR** acetaminophen, LORazepam, prochlorperazine  Allergies as of 07/18/2022 - Review Complete 07/18/2022  Allergen Reaction Noted   Januvia [sitagliptin] Other (See Comments) 01/26/2022   Statins Other (See Comments) 06/25/2014    Family History  Problem Relation Age of Onset   Stroke Mother 42       Cerebellar hemorrhage    Social History   Socioeconomic History   Marital status: Widowed    Spouse name: Not on file   Number of children: 1   Years of education: Not on file   Highest education level: Not on file  Occupational History   Occupation: Retail buyer    Comment: retired  Tobacco Use   Smoking status: Former    Packs/day: 1.50    Years: 20.00    Total pack years: 30.00    Types: Cigarettes    Quit date: 05/27/1979    Years since quitting: 43.1   Smokeless tobacco: Former  Scientific laboratory technician Use: Never used  Substance and Sexual Activity   Alcohol use: Yes    Alcohol/week: 4.0 standard drinks of alcohol    Types: 4 Shots of liquor per  week    Comment: 4 "canadian clubs each night"   Drug use: No   Sexual activity: Yes  Other Topics Concern   Not on file  Social History Narrative   Not on file   Social Determinants of Health   Financial Resource Strain: Not on file  Food Insecurity: No Food Insecurity (07/01/2022)   Hunger Vital Sign    Worried About Running Out of Food in the Last Year: Never true    Ran Out of Food in  the Last Year: Never true  Transportation Needs: No Transportation Needs (07/01/2022)   PRAPARE - Hydrologist (Medical): No    Lack of Transportation (Non-Medical): No  Physical Activity: Not on file  Stress: Not on file  Social Connections: Not on file  Intimate Partner Violence: Not At Risk (07/01/2022)   Humiliation, Afraid, Rape, and Kick questionnaire    Fear of Current or Ex-Partner: No    Emotionally Abused: No    Physically Abused: No    Sexually Abused: No    Review of Systems: All negative except as stated above in HPI.  Physical Exam:Physical Exam Constitutional:      General: He is not in acute distress.    Appearance: He is normal weight.  HENT:     Head: Normocephalic and atraumatic.     Right Ear: External ear normal.     Left Ear: External ear normal.     Nose: Nose normal.     Mouth/Throat:     Mouth: Mucous membranes are moist.  Eyes:     General: No scleral icterus.    Pupils: Pupils are equal, round, and reactive to light.     Comments: Conjunctival pallor  Cardiovascular:     Rate and Rhythm: Normal rate and regular rhythm.     Pulses: Normal pulses.     Heart sounds: Normal heart sounds.  Pulmonary:     Effort: Pulmonary effort is normal.     Breath sounds: Normal breath sounds.  Abdominal:     General: Abdomen is flat. Bowel sounds are normal. There is no distension.     Palpations: Abdomen is soft. There is no mass.     Tenderness: There is no abdominal tenderness. There is no guarding or rebound.     Hernia: No hernia is  present.  Musculoskeletal:        General: Normal range of motion.     Cervical back: Normal range of motion and neck supple.     Comments: Left lower extremity erythematous   Skin:    General: Skin is warm and dry.     Coloration: Skin is pale.  Neurological:     General: No focal deficit present.     Mental Status: He is alert and oriented to person, place, and time. Mental status is at baseline.  Psychiatric:        Mood and Affect: Mood normal.        Behavior: Behavior normal.     Vital signs: Vitals:   07/19/22 0700 07/19/22 0758  BP: (!) 151/65   Pulse: 78   Resp: 20   Temp:  (!) 97.4 F (36.3 C)  SpO2: 100%         GI:  Lab Results: Recent Labs    07/18/22 1252 07/18/22 2036 07/19/22 0500  WBC 8.5  --  8.2  HGB 8.0* 7.8* 7.2*  HCT 23.9* 23.0* 21.7*  PLT 111*  --  94*   BMET Recent Labs    07/18/22 1252 07/19/22 0500  NA 126* 128*  K 4.5 3.5  CL 96* 99  CO2 20* 18*  GLUCOSE 62* 127*  BUN 40* 39*  CREATININE 2.38* 2.36*  CALCIUM 7.6* 7.2*   LFT Recent Labs    07/19/22 0500  PROT 5.4*  ALBUMIN 2.4*  AST 25  ALT 18  ALKPHOS 44  BILITOT 0.7   PT/INR Recent Labs    07/18/22 1252  LABPROT 17.4*  INR 1.4*  Studies/Results: MR BRAIN WO CONTRAST  Result Date: 07/18/2022 CLINICAL DATA:  Initial evaluation for acute TIA. EXAM: MRI HEAD WITHOUT CONTRAST TECHNIQUE: Multiplanar, multiecho pulse sequences of the brain and surrounding structures were obtained without intravenous contrast. COMPARISON:  Prior CT from earlier the same day. FINDINGS: Brain: Examination degraded by motion artifact. Diffuse prominence of the CSF containing spaces compatible generalized cerebral atrophy. Mild for age chronic microvascular ischemic disease noted involving the periventricular white matter. Few small remote lacunar infarcts noted about the bilateral basal ganglia and cerebellum. Two distinct punctate foci of restricted diffusion are seen involving the  white matter of the left corona radiata (series 5, images 29, 32). Associated signal loss on ADC map (series 6, image 29). Findings consistent with small acute ischemic infarcts. No associated hemorrhage or mass effect. No other evidence for acute or subacute ischemia. Gray-white matter differentiation otherwise maintained. No areas of chronic cortical infarction. No other acute or chronic intracranial blood products. No mass lesion, midline shift or mass effect. No hydrocephalus or extra-axial fluid collection. Pituitary gland and suprasellar region within normal limits. Vascular: Major intracranial vascular flow voids are maintained. Skull and upper cervical spine: Craniocervical junction within normal limits. Bone marrow signal intensity grossly normal. No scalp soft tissue abnormality. Sinuses/Orbits: Sequelae of prior ocular lens replacement on the right. Susceptibility artifact emanates from the lateral aspect of the left orbit. Mild scattered mucosal thickening noted about the ethmoidal air cells and maxillary sinuses. Small bilateral mastoid effusions noted, of doubtful significance. Negative nasopharynx. Other: None. IMPRESSION: 1. Two distinct punctate foci of restricted diffusion involving the white matter of the left corona radiata, consistent with small acute ischemic infarcts. No associated hemorrhage or mass effect. 2. No other acute intracranial abnormality. 3. Underlying age-related cerebral atrophy with mild chronic small vessel ischemic disease. Electronically Signed   By: Jeannine Boga M.D.   On: 07/18/2022 19:43   VAS [place] LOWER EXTREMITY ARTERIAL DUPLEX  Result Date: 07/18/2022 LOWER EXTREMITY ARTERIAL DUPLEX STUDY Patient Name:  Cassey Hurrell.  Date of Exam:   07/18/2022 Medical Rec #: [id number]         Accession #:    [id number] Date of Birth: [birth year]          Patient Gender: M Patient Age:   21 years Exam Location:  [facility] Procedure:      VAS [place] LOWER EXTREMITY  ARTERIAL DUPLEX Referring Phys: Jamelle Haring --------------------------------------------------------------------------------  Indications: Cellulitis about left calf incision. High Risk Factors: Hypertension, hyperlipidemia, Diabetes.  Current ABI: Limitations: Incision, staples Comparison Study: No prior studies. Performing Technologist: Oliver Hum RVT  Examination Guidelines: A complete evaluation includes B-mode imaging, spectral Doppler, color Doppler, and power Doppler as needed of all accessible portions of each vessel. Bilateral testing is considered an integral part of a complete examination. Limited examinations for reoccurring indications may be performed as noted.   Left Graft #1: +--------------------+--------+--------+--------+--------+                     PSV cm/sStenosisWaveformComments +--------------------+--------+--------+--------+--------+ Inflow                                               +--------------------+--------+--------+--------+--------+ Proximal Anastomosis                                 +--------------------+--------+--------+--------+--------+  Proximal Graft                                       +--------------------+--------+--------+--------+--------+ Mid Graft                                            +--------------------+--------+--------+--------+--------+ Distal Graft        55                               +--------------------+--------+--------+--------+--------+ Distal Anastomosis  192                              +--------------------+--------+--------+--------+--------+ Outflow                                              +--------------------+--------+--------+--------+--------+ There is an area of mixed echogenicity adjacent to the bypass graft in the mid calf.  Summary: Left: Widely patent graft. There is an area of mixed echogenicity adjacent to the bypass graft in the mid calf.  See table(s) above for  measurements and observations.    Preliminary    CT HEAD WO CONTRAST  Result Date: 07/18/2022 CLINICAL DATA:  Mental status change, unknown cause EXAM: CT HEAD WITHOUT CONTRAST TECHNIQUE: Contiguous axial images were obtained from the base of the skull through the vertex without intravenous contrast. RADIATION DOSE REDUCTION: This exam was performed according to the departmental dose-optimization program which includes automated exposure control, adjustment of the mA and/or kV according to patient size and/or use of iterative reconstruction technique. COMPARISON:  05/20/2020 FINDINGS: Brain: No evidence of acute infarction, hemorrhage, hydrocephalus, extra-axial collection or mass lesion/mass effect. Scattered low-density changes within the periventricular and subcortical white matter compatible with chronic microvascular ischemic change. Mild diffuse cerebral volume loss. Vascular: Atherosclerotic calcifications involving the large vessels of the skull base. No unexpected hyperdense vessel. Skull: Normal. Negative for fracture or focal lesion. Sinuses/Orbits: Mild mucosal thickening within the paranasal sinuses. Other: None. IMPRESSION: 1. No acute intracranial abnormality. 2. Chronic microvascular ischemic change and cerebral volume loss. Electronically Signed   By: Davina Poke D.O.   On: 07/18/2022 13:43   DG Chest 2 View  Result Date: 07/18/2022 CLINICAL DATA:  Suspected sepsis. EXAM: CHEST - 2 VIEW COMPARISON:  January 08, 2022 FINDINGS: The patient is status post CABG. Heart size is mildly enlarged but stable. The hila and mediastinum are normal. No pneumothorax. No nodules or masses. No focal infiltrates. IMPRESSION: No cause for sepsis identified. Electronically Signed   By: Dorise Bullion III M.D.   On: 07/18/2022 13:27    Impression: Melena, anemia  Patient presenting with 1 week of abnormally dark stools.  Positive Hemoccult in the ED.  Hemoglobin dropping from baseline around 9-7.2 this  morning.  Patient has elevation of BUN and creatinine.  Denies NSAID use.  Concern for possible upper GI bleed.   HGB 7.2 Platelets 94  Cellulitis of the left lower extremity Hypoglycemia Systolic and diastolic CHF    Plan: Plan for EGD tomorrow tentatively at [facility]. I thoroughly discussed the procedures to include  nature, alternatives, benefits, and risks including but not limited to bleeding, perforation, infection, anesthesia/cardiac and pulmonary complications. Patient provides understanding and gave verbal consent to proceed. Start Protonix IV 28m BID Consider clear liquid diet NPO at midnight Continue anti-emetics and supportive care as needed. [facility] will follow.     LOS: 1 day   GCharlott Rakes PA-C 07/19/2022, 8:24 AM  Contact #  [phone]

## 2022-07-19 NOTE — Progress Notes (Signed)
Steve [name] is admitted to 49w33. Connected to telemetry, oriented to room, call light and bed controls.

## 2022-07-19 NOTE — ED Notes (Signed)
CBG 95 

## 2022-07-19 NOTE — Progress Notes (Addendum)
PROGRESS NOTE  [name]  DOB: [birth year]  PCP: System, Provider Not In [facility]:[id number]  DOA: 07/18/2022  LOS: 1 day  Hospital Day: 2  Brief narrative: Steve [name]. is a 81 y.o. male with PMH significant for DM2, HTN, HLD, PAD with intermittent claudication, CAD/CABG/stents, ischemic cardiomyopathy, paroxysmal A-fib on Eliquis, right bundle branch block. Recently hospitalized 9/1 to 9/19 for sepsis secondary to left foot cellulitis/osteomyelitis 9/13, underwent left common femoral to peroneal bypass with PTFE by vascular surgeon Dr. Stanford Breed 9/15, underwent fifth toe amputation by podiatry 9/19, he was discharged back to independent living at [facility] with an indwelling Foley catheter for urinary retention.  He had received IV antibiotics during the stay and was discharged on oral doxycycline. Post discharge, patient continued to have pain at the surgical site.Marland Kitchen  He started having poor oral intake and had intermittent episodes of diarrhea.  9/24, patient's son went to meet him and found him altered and not acting right.  He was was brought to the ED for altered mental status, progressive lethargy, slurred speech.  In the ED, patient had a fever of 100.5, heart rate 90s, blood pressure 130.  52, breathing on room air. He was noted to have cellulitis at the site of recent left calf incision. Labs with WBC count 8.5, hemoglobin 8, platelet 111 Sodium low at 126, BUN/creatinine elevated to 40/2.38 Lactic acid normal RVP negative for influenza COVID Melanotic stool, FOBT positive Urinalysis showed hazy yellow urine with moderate amount of hemoglobin, moderate leukocytes, no bacteria CT head did not show any acute findings but showed chronic microvascular ischemic change and cerebral volume loss. MRI brain showed two distinct punctate foci of restricted diffusion involving the white matter of the left corona radiata, consistent with small acute ischemic infarcts. No associated  hemorrhage or mass effect. He had recurrent episodes of hypoglycemia in the ED with blood was level as low as 59.  He was given dextrose boluses and was subsequently started on D5 drip. Admitted to hospitalist service for further evaluation management. He was started on IV antibiotics for surgical site infection Vascular surgery consultation was obtained.  Subjective: Patient was seen and examined this morning.  Pleasant elderly Caucasian male.  Lying on bed.  Not in distress.  Mental status seems better than what was documented at the time of presentation. Chart reviewed Overnight, Tmax of 100, heart rate in 80s, blood pressure 150s  Assessment and plan: Surgical site infection -left calf Recent left common femoral to peroneal bypass with PTFE- 9/13 Presented with cellulitic changes at the site of recent left calf incision for bypass Currently on IV Rocephin, IV vancomycin  Vascular surgery to follow-up.  Arterial duplex showed widely patent graft but also an area of mixed echogenicity adjacent to the bypass graft in the mid calf.  Per vascular surgery, if he has periprosthetic infection, he will need graft excision.    Acute stroke Because of altered mentation, patient underwent CT scan head and MRI brain in the ED.  MRI brain showed 2 distinct punctate foci in the left corona radiata suggestive of small acute ischemic infarcts.  I wonder if stroke was the big reason of his mental status changes. Stroke work-up: TSH and lipid panel ordered.  Echocardiogram was done 9/11.  Not ordered repeat.  A1c 7.9 on 9/1. Spoke with neurology Dr. Lorrin Goodell who will see the patient once patient gets to [facility].   To be seen by PT/OT. PTA, patient was already on Eliquis.  Currently on hold because of GI bleeding.  Defer to neurology for any need of medication optimization.  Type 2 diabetes mellitus Hypoglycemia A1c 7.9 on 06/25/2022 which is acceptable given his age and comorbidities In the ED, he had  recurrent hypoglycemia as low as 59 after which he was started on IV dextrose drip. PTA on Levemir 25 units daily, metformin 1000 mg at bedtime, Currently on dextrose drip because of hypoglycemic episode.  Keep his scheduled insulin on hold.  Continue sliding scale with Accu-Cheks. Recent Labs  Lab 07/19/22 0406 07/19/22 0607 07/19/22 0757 07/19/22 1031 07/19/22 1246  GLUCAP 95 104* 110* 112* 115*   GI bleeding Acute on chronic anemia In the ED, patient was noted to have melanotic stool and was FOBT positive Baseline hemoglobin close to 9.  Chronically on Eliquis for anticoagulation.  Eliquis has been held. Hemoglobin downtrending, 7.2 this morning.  Continue to monitor.  Transfuse if less than 7. [facility] consulted.  Noted a tentative plan of EGD tomorrow. 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  Chronic combined systolic and diastolic CHF  Ischemic cardiomyopathy Essential hypertension On admission, noted to be clinically volume depleted, poor oral intake per history.   Currently on D5 NS at 75 mill per hour. PTA on Toprol 100 mg daily, doxazosin 2 mg daily, Imdur 30 mg daily Continue Toprol, Imdur.  Doxazosin on hold.  Continue to monitor blood pressure Last echo 9/11 with EF 35 to 40%, LV global hypokinesis, G2 DD, moderate MR  CAD/CABG/stents PAD s/p recent bypass Hyperlipidemia No cardiac symptoms.   PTA on metoprolol, Eliquis, statin Eliquis plan as above.  PAF (paroxysmal atrial fibrillation)  Continue metoprolol.  Eliquis plan as above   Moderate protein malnutrition  Protein supplementation. Consider nutritional services evaluation.   Goals of  care   Code Status: Full Code    Mobility: Needs PT evaluation after definite plan from vascular surgery  Skin assessment:     Nutritional status:  Body mass index is 27.76 kg/m.          Diet:  Diet Order             Diet NPO time specified  Diet effective midnight           Diet clear liquid Room service appropriate? Yes; Fluid consistency: Thin  Diet effective now                   DVT prophylaxis:  SCDs Start: 07/18/22 1541   Antimicrobials: IV Rocephin Fluid: D5 NS at 24 mill per hour Consultants: Vascular surgery, GI Family Communication: Called and discussed with patient's son [name] this afternoon.  Status is: Inpatient  Continue in-hospital care because: Continue IV antibiotics, may need further procedures Level of care: Progressive   Dispo: The patient is from: Home    Anticipated d/c is to: Pending clinical course  Patient currently is not medically stable to d/c.   Difficult to place patient No     Infusions:   cefTRIAXone (ROCEPHIN)  IV 1 g (07/19/22 1358)   dextrose 5 % and 0.9% NaCl 75 mL/hr at 07/19/22 1354   [START ON 07/20/2022] vancomycin      Scheduled Meds:  isosorbide mononitrate  30 mg Oral Daily   metoprolol succinate  100 mg Oral Q supper   pantoprazole (PROTONIX) IV  40 mg Intravenous Q12H    PRN meds: acetaminophen **OR** acetaminophen, LORazepam, prochlorperazine   Antimicrobials: Anti-infectives (From admission, onward)    Start     Dose/Rate Route Frequency Ordered Stop   07/20/22 1500  vancomycin (VANCOREADY) IVPB 1500 mg/300 mL        1,500 mg 150 mL/hr over 120 Minutes Intravenous Every 48 hours 07/18/22 1851     07/19/22 1400  cefTRIAXone (ROCEPHIN) 1 g in sodium chloride 0.9 % 100 mL IVPB        1 g 200 mL/hr over 30 Minutes Intravenous Every 24 hours 07/18/22 1840     07/18/22 1445  vancomycin (VANCOREADY) IVPB 1500 mg/300 mL        1,500 mg 150 mL/hr over 120 Minutes Intravenous  Once 07/18/22 1430  07/18/22 1843   07/18/22 1430  cefTRIAXone (ROCEPHIN) 2 g in sodium chloride 0.9 % 100 mL IVPB        2 g 200 mL/hr over 30 Minutes Intravenous  Once 07/18/22 1425 07/18/22 1534       Objective: Vitals:   07/19/22 1143 07/19/22 1300  BP:  (!) 146/73  Pulse:  86  Resp:  (!) 22  Temp: (!) 97.4 F (36.3 C)   SpO2:  95%    Intake/Output Summary (Last 24 hours) at 07/19/2022 1425 Last data filed at 07/18/2022 1843 Gross per 24 hour  Intake 495.71 ml  Output --  Net 495.71 ml   Filed Weights   07/18/22 1259  Weight: 85.3 kg   Weight change:  Body mass index is 27.76 kg/m.   Physical Exam: General exam: Pleasant, elderly Caucasian male.  Not in physical distress Skin: No rashes, lesions or ulcers. HEENT: Atraumatic, normocephalic, no obvious bleeding Lungs: Clear to auscultation bilaterally CVS: Regular rate and rhythm, no murmur GI/Abd soft, nontender, nondistended, bowel sound present CNS: Alert, awake, oriented x3 Psychiatry: Mood appropriate Extremities: Left leg with surgical incision site redness  Data Review: I have personally reviewed the laboratory data and studies available.  F/u labs ordered Unresulted Labs (From admission, onward)     Start     Ordered   07/20/22 0500  CBC with Differential/Platelet  Tomorrow morning,   R        07/19/22 1358   07/20/22 5465  Basic metabolic panel  Tomorrow morning,   R        07/19/22 1358   07/20/22 0500  TSH  Tomorrow morning,   R        07/19/22 1419   07/20/22 0500  Lipid panel  Tomorrow morning,   R        07/19/22 1419   07/18/22 1851  Hemoglobin and hematocrit, blood  Now then every 4 hours,   R      07/18/22 1850   07/18/22 1302  Remove and replace urinary cath (placed > 5 days) then obtain urine culture from new indwelling urinary catheter.  (Urine Culture)  Once,   URGENT       Question:  Indication  Answer:  Altered mental status (if no other cause identified)   07/18/22 1301   07/18/22 1245  Culture,  blood (Routine x 2)  BLOOD CULTURE X 2,   R      07/18/22 1245   07/18/22 0000  Type and screen  R  Comments: [place]    07/18/22 1429            Signed, Terrilee Croak, MD [facility] 07/19/2022

## 2022-07-19 NOTE — ED Notes (Signed)
Cbg 88 

## 2022-07-20 ENCOUNTER — Inpatient Hospital Stay (HOSPITAL_COMMUNITY): Payer: Medicare Other

## 2022-07-20 DIAGNOSIS — I639 Cerebral infarction, unspecified: Secondary | ICD-10-CM

## 2022-07-20 DIAGNOSIS — L03116 Cellulitis of left lower limb: Secondary | ICD-10-CM | POA: Diagnosis not present

## 2022-07-20 LAB — GLUCOSE, CAPILLARY
Glucose-Capillary: 165 mg/dL — ABNORMAL HIGH (ref 70–99)
Glucose-Capillary: 153 mg/dL — ABNORMAL HIGH (ref 70–99)
Glucose-Capillary: 207 mg/dL — ABNORMAL HIGH (ref 70–99)
Glucose-Capillary: 136 mg/dL — ABNORMAL HIGH (ref 70–99)

## 2022-07-20 LAB — CBC WITH DIFFERENTIAL/PLATELET
Abs Immature Granulocytes: 0.03 10*3/uL (ref 0.00–0.07)
Basophils Absolute: 0 10*3/uL (ref 0.0–0.1)
Basophils Relative: 0 %
Eosinophils Absolute: 0.2 10*3/uL (ref 0.0–0.5)
Eosinophils Relative: 3 %
HCT: 21.8 % — ABNORMAL LOW (ref 39.0–52.0)
Hemoglobin: 7.6 g/dL — ABNORMAL LOW (ref 13.0–17.0)
Immature Granulocytes: 0 %
Lymphocytes Relative: 11 %
Lymphs Abs: 0.8 10*3/uL (ref 0.7–4.0)
MCH: 30.9 pg (ref 26.0–34.0)
MCHC: 34.9 g/dL (ref 30.0–36.0)
MCV: 88.6 fL (ref 80.0–100.0)
Monocytes Absolute: 0.3 10*3/uL (ref 0.1–1.0)
Monocytes Relative: 4 %
Neutro Abs: 6.4 10*3/uL (ref 1.7–7.7)
Neutrophils Relative %: 82 %
Platelets: 95 10*3/uL — ABNORMAL LOW (ref 150–400)
RBC: 2.46 MIL/uL — ABNORMAL LOW (ref 4.22–5.81)
RDW: 16.2 % — ABNORMAL HIGH (ref 11.5–15.5)
WBC: 7.8 10*3/uL (ref 4.0–10.5)
nRBC: 0 % (ref 0.0–0.2)

## 2022-07-20 LAB — LIPID PANEL
Cholesterol: 74 mg/dL (ref 0–200)
HDL: 16 mg/dL — ABNORMAL LOW (ref >40)
LDL Cholesterol: 35 mg/dL (ref 0–99)
Total CHOL/HDL Ratio: 4.6 RATIO
Triglycerides: 116 mg/dL (ref <150)
VLDL: 23 mg/dL (ref 0–40)

## 2022-07-20 LAB — HEMOGLOBIN AND HEMATOCRIT, BLOOD
HCT: 20.3 % — ABNORMAL LOW (ref 39.0–52.0)
Hemoglobin: 7.1 g/dL — ABNORMAL LOW (ref 13.0–17.0)

## 2022-07-20 LAB — BASIC METABOLIC PANEL
Anion gap: 9 (ref 5–15)
BUN: 36 mg/dL — ABNORMAL HIGH (ref 8–23)
CO2: 16 mmol/L — ABNORMAL LOW (ref 22–32)
Calcium: 7.5 mg/dL — ABNORMAL LOW (ref 8.9–10.3)
Chloride: 101 mmol/L (ref 98–111)
Creatinine, Ser: 2.3 mg/dL — ABNORMAL HIGH (ref 0.61–1.24)
GFR, Estimated: 27 mL/min — ABNORMAL LOW (ref >60)
Glucose, Bld: 153 mg/dL — ABNORMAL HIGH (ref 70–99)
Potassium: 3.6 mmol/L (ref 3.5–5.1)
Sodium: 126 mmol/L — ABNORMAL LOW (ref 135–145)

## 2022-07-20 LAB — SODIUM, URINE, RANDOM: Sodium, Ur: 101 mmol/L

## 2022-07-20 LAB — TSH: TSH: 2.527 u[IU]/mL (ref 0.350–4.500)

## 2022-07-20 MED ORDER — INSULIN ASPART 100 UNIT/ML IJ SOLN: 0.0000 [IU] | Freq: Every day | INTRAMUSCULAR | Status: DC

## 2022-07-20 MED ORDER — FUROSEMIDE 10 MG/ML IJ SOLN
40.0000 mg | Freq: Two times a day (BID) | INTRAMUSCULAR | Status: DC
  Administered 2022-07-20 – 2022-07-21 (×2): 40 mg via INTRAVENOUS
  Filled 2022-07-20 (×2): qty 4

## 2022-07-20 MED ORDER — INSULIN ASPART 100 UNIT/ML IJ SOLN
0.0000 [IU] | Freq: Three times a day (TID) | INTRAMUSCULAR | Status: DC
  Administered 2022-07-24 – 2022-07-26 (×2): 1 [IU] via SUBCUTANEOUS

## 2022-07-20 MED ORDER — STROKE: EARLY STAGES OF RECOVERY BOOK
Freq: Once | Status: AC
  Filled 2022-07-20: qty 1

## 2022-07-20 NOTE — H&P (View-Only) (Signed)
[facility] Gastroenterology Progress Note  Steve [name]. 81 y.o. [birth year]   Subjective: Feels ok. Denies abdominal pain. Son at bedside.  Objective: Vital signs: Vitals:   07/20/22 0000 07/20/22 0343  BP: (!) 119/49 (!) 147/68  Pulse: 81 79  Resp: 19 19  Temp:  98.1 F (36.7 C)  SpO2: 94% 98%    Physical Exam: Gen: chronically ill-appearing, elderly, lethargic, no acute distress, thin HEENT: anicteric sclera CV: RRR Chest: CTA B Abd: soft, nontender, nondistended, +BS Ext: no edema  Lab Results: Recent Labs    07/19/22 0500 07/20/22 0329  NA 128* 126*  K 3.5 3.6  CL 99 101  CO2 18* 16*  GLUCOSE 127* 153*  BUN 39* 36*  CREATININE 2.36* 2.30*  CALCIUM 7.2* 7.5*   Recent Labs    07/18/22 1252 07/19/22 0500  AST 53* 25  ALT 21 18  ALKPHOS 56 44  BILITOT 1.5* 0.7  PROT 6.3* 5.4*  ALBUMIN 2.7* 2.4*   Recent Labs    07/18/22 1252 07/18/22 2036 07/19/22 0500 07/20/22 0329  WBC 8.5  --  8.2 7.8  NEUTROABS 7.7  --   --  6.4  HGB 8.0*   < > 7.2* 7.6*  HCT 23.9*   < > 21.7* 21.8*  MCV 90.9  --  91.6 88.6  PLT 111*  --  94* 95*   < > = values in this interval not displayed.      Assessment/Plan: GI bleed on Eliquis - no signs of ongoing bleeding. Eliquis on hold. EGD planned for today was postponed until tomorrow because patient received a clear liquid diet this morning. Continue IV PPI Q 12 hours. NPO p MN. EGD tomorrow morning.   Steve [name] 07/20/2022, 10:24 AM  Questions please call [phone] ID: Steve [name]., male   DOB: 11-19-1940, 81 y.o.   MRN: [id number]

## 2022-07-20 NOTE — Progress Notes (Addendum)
  Progress Note    07/20/2022 12:27 PM * No surgery date entered *  Subjective:  says he feels much better than when he came into the hospital    Vitals:   07/20/22 0343 07/20/22 1100  BP: (!) 147/68   Pulse: 79   Resp: 19   Temp: 98.1 F (36.7 C) 97.8 F (36.6 C)  SpO2: 98%     Physical Exam: Lungs:  nonlabored Incisions:  L groin incision intact. L calf incision intact with staples, no drainage. L toe amputation site healing with prolene Extremities: Brisk L peroneal doppler signal. Soft L monophasic doppler signal. Erythema over L calf incision site.  CBC    Component Value Date/Time   WBC 7.8 07/20/2022 0329   RBC 2.46 (L) 07/20/2022 0329   HGB 7.6 (L) 07/20/2022 0329   HGB 9.9 (L) 07/24/2018 1423   HCT 21.8 (L) 07/20/2022 0329   PLT 95 (L) 07/20/2022 0329   PLT 128 (L) 07/24/2018 1423   MCV 88.6 07/20/2022 0329   MCH 30.9 07/20/2022 0329   MCHC 34.9 07/20/2022 0329   RDW 16.2 (H) 07/20/2022 0329   LYMPHSABS 0.8 07/20/2022 0329   MONOABS 0.3 07/20/2022 0329   EOSABS 0.2 07/20/2022 0329   BASOSABS 0.0 07/20/2022 0329    BMET    Component Value Date/Time   NA 126 (L) 07/20/2022 0329   K 3.6 07/20/2022 0329   CL 101 07/20/2022 0329   CO2 16 (L) 07/20/2022 0329   GLUCOSE 153 (H) 07/20/2022 0329   BUN 36 (H) 07/20/2022 0329   CREATININE 2.30 (H) 07/20/2022 0329   CALCIUM 7.5 (L) 07/20/2022 0329   GFRNONAA 27 (L) 07/20/2022 0329   GFRAA 46 (L) 05/19/2020 1311    INR    Component Value Date/Time   INR 1.4 (H) 07/18/2022 1252     Intake/Output Summary (Last 24 hours) at 07/20/2022 1227 Last data filed at 07/20/2022 0400 Gross per 24 hour  Intake 240 ml  Output 600 ml  Net -360 ml     Assessment/Plan:  81 y.o. male with LLE cellulitis and recent L fem-peroneal bypass on 9/13   -Cellulitis of LLE stable, erythema over left calf incision -L calf incision from bypass is intact with staples, no drainage -Brisk L peroneal doppler signal -L  partial 5th ray amputation site healing appropriately -His Eliquis is on hold due to GI bleed. He is scheduled for EGD tomorrow -Continue abx regime  Vicente Serene, PA-C [facility] [phone] 07/20/2022 12:27 PM

## 2022-07-20 NOTE — Progress Notes (Signed)
PROGRESS NOTE    Steve Mask.  HWE:[id number] DOB: 1941/07/01 DOA: 07/18/2022 PCP: System, Provider Not In  Chief Complaint  Patient presents with   Code Sepsis    Brief Narrative:  Steve Mask. is Steve [name] 81 y.o. male with PMH significant for DM2, HTN, HLD, PAD with intermittent claudication, CAD/CABG/stents, ischemic cardiomyopathy, paroxysmal Steve [name] on Eliquis, right bundle branch block. Recently hospitalized 9/1 to 9/19 for sepsis secondary to left foot cellulitis/osteomyelitis 9/13, underwent left common femoral to peroneal bypass with PTFE by vascular surgeon Dr. Stanford Breed 9/15, underwent fifth toe amputation by podiatry 9/19, he was discharged back to independent living at [facility] with an indwelling Foley catheter for urinary retention.  He had received IV antibiotics during the stay and was discharged on oral doxycycline. Post discharge, patient continued to have pain at the surgical site.Marland Kitchen  He started having poor oral intake and had intermittent episodes of diarrhea.   9/24, patient's son went to meet him and found him altered and not acting right.  He was was brought to the ED for altered mental status, progressive lethargy, slurred speech.   In the ED, patient had Lynnae Ludemann fever of 100.5, heart rate 90s, blood pressure 130.  52, breathing on room air. He was noted to have cellulitis at the site of recent left calf incision. Labs with WBC count 8.5, hemoglobin 8, platelet 111 Sodium low at 126, BUN/creatinine elevated to 40/2.38 Lactic acid normal RVP negative for influenza COVID Melanotic stool, FOBT positive Urinalysis showed hazy yellow urine with moderate amount of hemoglobin, moderate leukocytes, no bacteria CT head did not show any acute findings but showed chronic microvascular ischemic change and cerebral volume loss. MRI brain showed two distinct punctate foci of restricted diffusion involving the white matter of the left corona radiata, consistent with small acute  ischemic infarcts. No associated hemorrhage or mass effect. He had recurrent episodes of hypoglycemia in the ED with blood was level as low as 59.  He was given dextrose boluses and was subsequently started on D5 drip. Admitted to hospitalist service for further evaluation management. He was started on IV antibiotics for surgical site infection Vascular surgery consultation was obtained.   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 Per vascular surgery, if he has periprosthetic infection, he will need graft excision.   Would consider ID consult    Acute stroke MRI brain with 2 distinct punctate foci of restricted diffusion involving the white matter of the L corona radiata, c/w small acute ischemic infaracts MRA head and neck with chronic severe intracrania atherosclerosis, stable since 2021 CTA with following exceptions (new tandem severe tandem stenoses of  R PCA, new multifocal severe right MCA M2 and M3 branch stenoses), moderate to severe chronic L MCA M2 stenoses and moderate L and severe R distal vertebral artery stenoses.  Moderate to  severe supraclinoid R ICA stenosis.  Chronic R carotid bifurcation atherosclerosis with hemodynamically significant proximal R ICA stenosis.  R ICA remains patent.  Chronic L carotid atherosclerosis without evidence of hemodynamically significant stenosis.  Chronic vertebral artery atherosclerosis. TSH wnl LDL 35, A1c 7.9 Per neurology's not from 9/26, suspects tiny punctate L subcortical infarcts likely clinically silent.  Recommending continue eliquis and aggressive risk factor modification.  Can electively hold eliquis for endoscopy if medically necessary with small but acceptable periprocedural risk of TIA/stroke if pt willing. PT/OT/SLP   GI bleeding Acute on chronic anemia In the ED, patient was noted to have melanotic stool and was FOBT positive Baseline hemoglobin close to 9.  Chronically on Eliquis for anticoagulation.  Eliquis has been held. Hemoglobin relatively stable today [facility] consulted.  EGD planned for tmrw (delayed today as he'd had clear liquids)  AKI on CKD IIIa  Hyponatremia CXR shows interstitial pulm edema Suspect AKI and hyponatremia related to overload Diuresis with lasix 40 mg IV BID Strict I/O, daily weights UA has protein and RBC's Renal US is pending  Chronic combined systolic and diastolic CHF  Ischemic cardiomyopathy Essential hypertension CXR 9/26 with mild interstitial pulm edema Start lasix 40 mg IV BID Strict I/O, daily weights   PTA on Toprol 100 mg daily, doxazosin 2 mg daily, Imdur 30 mg daily Continue Toprol, Imdur.  Doxazosin on hold.  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   CAD/CABG/stents PAD s/p recent bypass Hyperlipidemia No cardiac symptoms.   PTA on metoprolol, Eliquis, statin Eliquis plan as above.   PAF (paroxysmal atrial fibrillation)  Continue metoprolol.  Eliquis plan as above   Moderate protein malnutrition  Protein supplementation. Consider nutritional services evaluation.    DVT prophylaxis: SCd Code Status: full Family Communication: son at bedside Disposition:   Status is: Inpatient Remains inpatient appropriate because: need for IV abx, neuro, vascular   Consultants:  Vascular neurology  Procedures:  none  Antimicrobials:  Anti-infectives (From admission, onward)    Start     Dose/Rate Route Frequency Ordered Stop   07/20/22 1500  vancomycin (VANCOREADY) IVPB 1500 mg/300 mL        1,500 mg 150 mL/hr over 120 Minutes Intravenous Every 48 hours 07/18/22 1851     07/19/22 1400  cefTRIAXone (ROCEPHIN) 1 g in sodium chloride 0.9 % 100 mL IVPB        1 g 200 mL/hr over 30 Minutes Intravenous Every 24 hours 07/18/22 1840     07/18/22 1445  vancomycin (VANCOREADY) IVPB 1500 mg/300 mL        1,500 mg 150 mL/hr over 120 Minutes Intravenous  Once 07/18/22 1430 07/18/22 1843   07/18/22 1430  cefTRIAXone (ROCEPHIN) 2 g in sodium chloride 0.9 % 100 mL IVPB  2 g 200 mL/hr over 30 Minutes Intravenous  Once 07/18/22 1425 07/18/22 1534       Subjective: No new complaints, son at bedside  Objective: Vitals:   07/20/22 0343 07/20/22 1100 07/20/22 1608 07/20/22 1946  BP: (!) 147/68  134/62 126/68  Pulse: 79  82 90  Resp: 19   19  Temp: 98.1 F (36.7 C) 97.8 F (36.6 C) 98 F (36.7 C) 98.8 F (37.1 C)  TempSrc: Temporal Oral Oral Oral  SpO2: 98%  95% 95%  Weight:      Height:        Intake/Output Summary (Last 24 hours) at  07/20/2022 2028 Last data filed at 07/20/2022 1700 Gross per 24 hour  Intake 1080 ml  Output 1150 ml  Net -70 ml   Filed Weights   07/18/22 1259  Weight: 85.3 kg    Examination:  General exam: Appears calm and comfortable  Respiratory system: unlabored Cardiovascular system: RRR Gastrointestinal system: Abdomen is nondistended, soft and nontender. Central nervous system: Alert and oriented. No focal neurological deficits. Extremities: LLE with with erythema and swelling to calf and foot, no crepitus noted (see images)   Data Reviewed: I have personally reviewed following labs and imaging studies  CBC: Recent Labs  Lab 07/14/22 1301 07/18/22 1252 07/18/22 2036 07/19/22 0500 07/20/22 0329  WBC 12.1* 8.5  --  8.2 7.8  NEUTROABS 11.7* 7.7  --   --  6.4  HGB 9.9* 8.0* 7.8* 7.2* 7.6*  HCT 30.2* 23.9* 23.0* 21.7* 21.8*  MCV 95.9 90.9  --  91.6 88.6  PLT 133* 111*  --  94* 95*    Basic Metabolic Panel: Recent Labs  Lab 07/14/22 1301 07/15/22 0430 07/18/22 1252 07/19/22 0500 07/20/22 0329  NA 132* 131* 126* 128* 126*  K 4.4 3.8 4.5 3.5 3.6  CL 97* 97* 96* 99 101  CO2 23 21* 20* 18* 16*  GLUCOSE 205* 138* 62* 127* 153*  BUN 35* 40* 40* 39* 36*  CREATININE 2.26* 2.13* 2.38* 2.36* 2.30*  CALCIUM 7.8* 7.4* 7.6* 7.2* 7.5*    GFR: Estimated Creatinine Clearance: 26.3 mL/min (Dearra Myhand) (by C-G formula based on SCr of 2.3 mg/dL (H)).  Liver Function Tests: Recent Labs  Lab 07/14/22 1301 07/18/22 1252 07/19/22 0500  AST 27 53* 25  ALT 14 21 18   ALKPHOS 45 56 44  BILITOT 1.5* 1.5* 0.7  PROT 6.7 6.3* 5.4*  ALBUMIN 2.9* 2.7* 2.4*    CBG: Recent Labs  Lab 07/19/22 2315 07/20/22 0455 07/20/22 0850 07/20/22 1204 07/20/22 1947  GLUCAP 157* 165* 153* 207* 136*     Recent Results (from the past 240 hour(s))  Culture, blood (Routine x 2)     Status: None (Preliminary result)   Collection Time: 07/18/22 12:52 PM   Specimen: BLOOD  Result Value Ref Range Status    Specimen Description   Final    BLOOD SITE NOT SPECIFIED Performed at [facility], [place] [address], [place], [place] [address]    Special Requests   Final    BOTTLES DRAWN AEROBIC AND ANAEROBIC Blood Culture results may not be optimal due to an inadequate volume of blood received in culture bottles Performed at [facility] [address], [place], [place] [address]    Culture   Final    NO GROWTH 2 DAYS Performed at [facility] [address], [place], [place] [address]    Report Status PENDING  Incomplete  Remove and  replace urinary cath (placed > 5 days) then obtain urine culture from new indwelling urinary catheter.     Status: None   Collection Time: 07/18/22  1:52 PM   Specimen: Urine, Catheterized  Result Value Ref Range Status   Specimen Description   Final    URINE, CATHETERIZED Performed at [facility] [address], [place], [place] [address]    Special Requests   Final    NONE Performed at [facility], [place] [address] [place], [place] [address]    Culture   Final    NO GROWTH Performed at [facility], [place] [address], [place], [place] [address]    Report Status 07/19/2022 FINAL  Final  Resp Panel by RT-PCR (Flu Mame Twombly&B, Covid) Anterior Nasal Swab     Status: None   Collection Time: 07/18/22  2:02 PM   Specimen: Anterior Nasal Swab  Result Value Ref Range Status   SARS Coronavirus 2 by RT PCR NEGATIVE NEGATIVE Final    Comment: (NOTE) SARS-CoV-2 target nucleic acids are NOT DETECTED.  The SARS-CoV-2 RNA is generally detectable in upper respiratory specimens during the acute phase of infection. The lowest concentration of SARS-CoV-2 viral copies this assay can detect is 138 copies/mL. Epimenio Schetter negative result does not preclude SARS-Cov-2 infection and should not be used as the sole basis for treatment or other patient management decisions. Pelham Hennick negative result may occur with   improper specimen collection/handling, submission of specimen other than nasopharyngeal swab, presence of viral mutation(s) within the areas targeted by this assay, and inadequate number of viral copies(<138 copies/mL). Vertis Bauder negative result must be combined with clinical observations, patient history, and epidemiological information. The expected result is Negative.  Fact Sheet for Patients:  EntrepreneurPulse.com.au  Fact Sheet for Healthcare Providers:  IncredibleEmployment.be  This test is no t yet approved or cleared by the [place] FDA and  has been authorized for detection and/or diagnosis of SARS-CoV-2 by FDA under an Emergency Use Authorization (EUA). This EUA will remain  in effect (meaning this test can be used) for the duration of the COVID-19 declaration under Section 564(b)(1) of the Act, 21 U.S.C.section 360bbb-3(b)(1), unless the authorization is terminated  or revoked sooner.       Influenza Kayleeann Huxford by PCR NEGATIVE NEGATIVE Final   Influenza B by PCR NEGATIVE NEGATIVE Final    Comment: (NOTE) The Xpert Xpress SARS-CoV-2/FLU/RSV plus assay is intended as an aid in the diagnosis of influenza from Nasopharyngeal swab specimens and should not be used as Maryan Sivak sole basis for treatment. Nasal washings and aspirates are unacceptable for Xpert Xpress SARS-CoV-2/FLU/RSV testing.  Fact Sheet for Patients: EntrepreneurPulse.com.au  Fact Sheet for Healthcare Providers: IncredibleEmployment.be  This test is not yet approved or cleared by the [place] FDA and has been authorized for detection and/or diagnosis of SARS-CoV-2 by FDA under an Emergency Use Authorization (EUA). This EUA will remain in effect (meaning this test can be used) for the duration of the COVID-19 declaration under Section 564(b)(1) of the Act, 21 U.S.C. section 360bbb-3(b)(1), unless the authorization is terminated  or revoked.  Performed at [facility], [place] [address] [place], [place] [address]   Culture, blood (Routine x 2)     Status: None (Preliminary result)   Collection Time: 07/19/22  6:21 PM   Specimen: BLOOD LEFT ARM  Result Value Ref Range Status   Specimen Description BLOOD LEFT ARM  Final   Special Requests   Final  BOTTLES DRAWN AEROBIC AND ANAEROBIC Blood Culture adequate volume   Culture   Final    NO GROWTH < 12 HOURS Performed at [facility], [place] [address] [place], [place] [address]    Report Status PENDING  Incomplete         Radiology Studies: DG CHEST PORT 1 VIEW  Result Date: 07/20/2022 CLINICAL DATA:  Hyponatremia. EXAM: PORTABLE CHEST 1 VIEW COMPARISON:  Chest x-ray dated July 18, 2022. FINDINGS: Unchanged borderline cardiomegaly status post CABG. New diffuse interstitial thickening. No focal consolidation, pleural effusion, or pneumothorax. No acute osseous abnormality. IMPRESSION: 1. New mild interstitial pulmonary edema. Electronically Signed   By: Titus Dubin M.D.   On: 07/20/2022 11:00   MR ANGIO HEAD WO CONTRAST  Result Date: 07/20/2022 CLINICAL DATA:  81 year old male with altered mental status, TIA, evidence of punctate white matter ischemia in the left hemisphere on MRI 2 days ago. EXAM: MRA HEAD WITHOUT CONTRAST TECHNIQUE: Angiographic images of the Circle of Willis were acquired using MRA technique without intravenous contrast. COMPARISON:  Neck MRA today reported separately. Brain MRI 07/18/2022. CTA head and neck 05/20/2020. FINDINGS: Anterior circulation: Both ICA siphons are patent but irregular with evidence of calcified atherosclerosis as demonstrated on the prior CTA. Moderate to severe right siphon stenosis suspected in the supraclinoid segment on series 5, image 105. No significant left siphon stenosis. Both carotid termini remain patent. Dominant left and diminutive or absent right ACA A1 segments. No left A1  stenosis. Anterior communicating artery and visible ACA branches are stable and within normal limits. Right MCA M1 segment and bifurcation remain patent. But multifocal Severe right MCA M2 and M3 branch stenoses appear progressed (series 1047, image 16). "Duplicated" left MCA M1 segment again noted. Chronic severe irregularity and stenoses in the region of the left MCA bifurcation appears similar to the 2021 CTA. Left MCA branches appear stable since that time. Posterior circulation: Antegrade flow in the posterior circulation with chronic severe distal right vertebral artery irregularity and stenosis (series 5, image 12). Less pronounced but up to moderate left vertebral V4 segment stenosis. Both distal vertebral arteries, vertebrobasilar junction, and basilar artery remain patent. No significant basilar stenosis. Both PICA origins appear patent.SCA origins are patent. Multifocal mild to moderate left PCA irregularity and stenosis appears stable since the 2021 CTA. But right P1 and P2 segment tandem severe stenoses have progressed since that time (series 1035, image 10). Distal right PCA branches remain patent. Anatomic variants: "Duplicated" left MCA M1 segment. Dominant left and diminutive or absent right ACA A1. Other: No intracranial mass effect or ventriculomegaly. IMPRESSION: 1. Chronic severe intracranial atherosclerosis, stable since Vandora Jaskulski 2021 CTA (see #2) with the following exceptions: - new tandem Severe tandem stenoses of the Right PCA. - new multifocal Severe Right MCA M2 and M3 branch stenoses. 2. Moderate to severe chronic Left MCA M2 stenoses. Moderate (Left) and severe (Right) distal vertebral artery stenoses. Moderate to severe supraclinoid Right ICA stenosis. Electronically Signed   By: Genevie Ann M.D.   On: 07/20/2022 08:46   MR ANGIO NECK WO CONTRAST  Result Date: 07/20/2022 CLINICAL DATA:  81 year old male with altered mental status, TIA, evidence of punctate white matter ischemia in the left  hemisphere on MRI 2 days ago. EXAM: MRA NECK WITHOUT CONTRAST TECHNIQUE: Angiographic images of the neck were acquired using MRA technique without intravenous contrast. Carotid stenosis measurements (when applicable) are obtained utilizing NASCET criteria, using the distal internal carotid diameter as the denominator. COMPARISON:  Brain MRI 07/18/2022.  CTA head and neck 05/20/2020. FINDINGS: Aortic arch: Calcified aortic atherosclerosis. 3 vessel arch configuration demonstrated on the prior CTA. Right carotid system: Patent with antegrade flow signal to the skull base. Irregularity at the right carotid bifurcation corresponds to bulky atherosclerosis demonstrated by CTA in 2021. Hemodynamically significant stenosis at that time (estimated at 75%). No definite flow gap on MRA today. Left carotid system: Antegrade flow to the skull base. Mild irregularity at the left carotid bifurcation corresponding to atherosclerosis demonstrated on prior CTA. No evidence of hemodynamically significant stenosis. Vertebral arteries: Codominant as on the prior CTA. Antegrade flow signal in both vertebral arteries to the skull base. Both vertebral artery origins were better demonstrated by CTA. Mild to moderate right V2 segment irregularity and stenosis redemonstrated on series 6, image 117. No evidence of left V2 stenosis. Other: None. IMPRESSION: 1. Chronic right carotid bifurcation atherosclerosis with hemodynamically significant proximal Right ICA stenosis (estimated at 75% by CTA in 2021). The right ICA remains patent. 2. Chronic left carotid atherosclerosis without evidence of hemodynamically significant stenosis. 3. Chronic vertebral artery atherosclerosis, more pronounced on the right and up to moderate in the right V2 segment. Both vertebral arteries remain patent to the skull base. Electronically Signed   By: Genevie Ann M.D.   On: 07/20/2022 08:37        Scheduled Meds:  furosemide  40 mg Intravenous BID   isosorbide  mononitrate  30 mg Oral Daily   metoprolol succinate  100 mg Oral Q supper   pantoprazole (PROTONIX) IV  40 mg Intravenous Q12H   Continuous Infusions:  cefTRIAXone (ROCEPHIN)  IV 1 g (07/20/22 1446)   vancomycin 1,500 mg (07/20/22 1538)     LOS: 2 days    Time spent: over 30 min    Fayrene Helper, MD [facility]   To contact the attending provider between 7A-7P or the covering provider during after hours 7P-7A, please log into the web site [email or web address] and access using universal [place] password for that web site. If you do not have the password, please call the hospital operator.  07/20/2022, 8:28 PM

## 2022-07-20 NOTE — Evaluation (Signed)
Occupational Therapy Evaluation Patient Details Name: Steve [name]. MRN: [id number] DOB: 19-Jun-1941 Today's Date: 07/20/2022   History of Present Illness Pt is an 81 y.o. male recently admitted from [facility] on 06/25/22 with AKI, hyponatremia, sepsis, L foot nonhealing wound. S/p aortogram, LLE angiogram 9/8. 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 WBAT RLE in Post-op shoe. Pt returned home on 9/19. Pt brought to [facility] ED on 9/24 after son found pt in his chair with AMS. MRI revealved "Two distinct punctate foci of restricted diffusion involving the  white matter of the left corona radiata, consistent with small acute  ischemic infarcts. No associated hemorrhage or mass effect." and pt also found to have UTI and BGL in the 50s.  PMH includes CAD s/p CABG, HTN, HLD, DM, PAD, afib.   Clinical Impression   Patient is currently requiring assistance with ADLs including up to [place]- light Mod assist with Lower body ADLs, setup assist with seated Upper body ADLs,  as well as heavy Minimal assist with functional transfers to toilet (simulated to recliner).  Current level of function is below patient's typical baseline and pt endorses one fall since his 3 days at home since discharge when trying to transfer to his toilet.   During this evaluation, patient was limited by LE weakness, incisional pain 2/10, and neuropathy, impaired activity tolerance, and impulsivity with poor compliance to light WB to LT heel only without post-op shoe (lost and new one pending delivery), all of which has the potential to impact patient's safety and independence during functional mobility, as well as performance for ADLs.  Patient lives alone at [facility].  Patient demonstrates good rehab potential, and should benefit from continued skilled occupational therapy services while in acute care to maximize safety, independence and quality of life at home.  Continued occupational  therapy services in a SNF setting prior to return home is recommended.  ?     Recommendations for follow up therapy are one component of a multi-disciplinary discharge planning process, led by the attending physician.  Recommendations may be updated based on patient status, additional functional criteria and insurance authorization.   Follow Up Recommendations  Skilled nursing-short term rehab (<3 hours/day)    Assistance Recommended at Discharge Frequent or constant Supervision/Assistance  Patient can return home with the following A little help with walking and/or transfers;A little help with bathing/dressing/bathroom;Direct supervision/assist for medications management;Direct supervision/assist for financial management;Help with stairs or ramp for entrance;Assist for transportation;Assistance with cooking/housework    Functional Status Assessment  Patient has had a recent decline in their functional status and demonstrates the ability to make significant improvements in function in a reasonable and predictable amount of time.  Equipment Recommendations  None recommended by OT    Recommendations for Other Services PT consult     Precautions / Restrictions Precautions Precautions: Fall Required Braces or Orthoses: Other Brace Other Brace: post op shoe Restrictions Weight Bearing Restrictions: No LLE Weight Bearing: Weight bearing as tolerated Other Position/Activity Restrictions: in post op shoe for transitions only per podiatry (per previous evaluation, however pt reported that he resumed ambulation with his rollator after discharge.).      Mobility Bed Mobility Overal bed mobility: Modified Independent             General bed mobility comments: no assistance for supine to sit    Transfers  Balance Overall balance assessment: History of Falls, Needs assistance Sitting-balance support: No upper extremity supported, Feet  supported Sitting balance-Leahy Scale: Good     Standing balance support: Bilateral upper extremity supported, Reliant on assistive device for balance Standing balance-Leahy Scale: Poor Standing balance comment: Will likely improve with post-op shoe and WBAT to LLE.                           ADL either performed or assessed with clinical judgement   ADL Overall ADL's : Needs assistance/impaired Eating/Feeding: Independent   Grooming: Sitting;Set up;Wash/dry hands   Upper Body Bathing: Supervision/ safety;Sitting;Set up   Lower Body Bathing: Sitting/lateral leans;Moderate assistance   Upper Body Dressing : Set up;Sitting   Lower Body Dressing: Minimal assistance Lower Body Dressing Details (indicate cue type and reason): Unable to locate post-op shoe. Son thought shoe was in pt's bag but must have been left at [facility] ED. RN called down for new post-op shoe which is being delivered this morning. Pt donned shoe to RT foot with setup using figure 4 position at EOB. Toilet Transfer: Minimal assistance;Cueing for sequencing;Cueing for safety;Rolling walker (2 wheels);Stand-pivot Armed forces technical officer Details (indicate cue type and reason): Without post-op shoe, pt informed that he must perform stand from EOB with light heel WB only. Pt followed cues initially, but unable to sustain in standing so pt ordered to sit back to EOB. Pt however used RW and began pivoting to chair. OT followed for safety with constant cues to keep weight off LT foot. Min Assist needed for safety. Toileting- Clothing Manipulation and Hygiene: Minimal assistance;Sitting/lateral lean       Functional mobility during ADLs: Minimal assistance       Vision Baseline Vision/History: 1 Wears glasses Ability to See in Adequate Light: 0 Adequate Vision Assessment?: No apparent visual deficits Additional Comments: Pt endorsing some acute vision changes, however pt with intact pursuits, intact peripheral vision and able  to accurately read clock on wall.     Perception     Praxis      Pertinent Vitals/Pain Pain Assessment Pain Assessment: 0-10 Pain Score: 2  Pain Location: at incision Pain Descriptors / Indicators: Burning     Hand Dominance Right   Extremity/Trunk Assessment Upper Extremity Assessment Upper Extremity Assessment: Overall WFL for tasks assessed;RUE deficits/detail;LUE deficits/detail RUE Deficits / Details: Edematous RUE. ROM limited to ~80 degrees. LUE Deficits / Details: Bruising to forearm. ROM limited to ~80 degrees.           Communication Communication Communication: No difficulties (Son reports pt with speech changes in ED but now resolved.)   Cognition Arousal/Alertness: Awake/alert Behavior During Therapy: WFL for tasks assessed/performed, Impulsive Overall Cognitive Status: Within Functional Limits for tasks assessed                                       General Comments       Exercises     Shoulder Instructions      Home Living Family/patient expects to be discharged to:: Skilled nursing facility Living Arrangements: Alone   Type of Home: Independent living facility             Bathroom Shower/Tub: Walk-in shower   Bathroom Toilet: Handicapped height Bathroom Accessibility: Yes How Accessible: Accessible via walker Home Equipment: [place]im (2 wheels);Rollator (4 wheels);Electric scooter;Shower seat - built in;Grab bars -  tub/shower;Grab bars - toilet;BSC/3in1   Additional Comments: Patient uses rollator to walk to dining room; does not use device in apartment; uses RW when goes out to appointments      Prior Functioning/Environment Prior Level of Function : Needs assist       Physical Assist : Mobility (physical);ADLs (physical) Mobility (physical): Gait;Transfers ADLs (physical): Bathing;Dressing;Toileting;IADLs Mobility Comments: Pt returned home from hospital on 9/19 and son brought pt to his room in a  wheelchair that [facility] provides. Pt had a fall in bathroom since being home and son found pt incoherent in chair on 9/24 with return to [facility] ED. Pt endorsed feeling "unsteady" since return home. ADLs Comments: Pt reports struggle since going home on 9/19. Must do own laundry and walk to laundry room with Rollator. Son reports 100 yards to dining room that pt previously ambulated to with rollator without diffiuclty but now unable. Pt reports that he was able to do "nothing" and struggled to care for self.        OT Problem List: Decreased strength;Decreased activity tolerance;Impaired balance (sitting and/or standing);Decreased safety awareness;Impaired sensation;Decreased knowledge of precautions      OT Treatment/Interventions: Self-care/ADL training;Therapeutic exercise;Energy conservation;DME and/or AE instruction;Therapeutic activities;Patient/family education;Balance training    OT Goals(Current goals can be found in the care plan section) Acute Rehab OT Goals Patient Stated Goal: Per son, and pt agreeing: For pt to go to a rehab facility to help determine whether pt can return to ILF or will need ALF OT Goal Formulation: With patient/family Time For Goal Achievement: 08/03/22 Potential to Achieve Goals: Good ADL Goals Pt Will Perform Lower Body Bathing: with modified independence;sitting/lateral leans;sit to/from stand Pt Will Perform Lower Body Dressing: with modified independence;sitting/lateral leans;sit to/from stand Pt Will Transfer to Toilet: with modified independence;ambulating Pt Will Perform Toileting - Clothing Manipulation and hygiene: with modified independence;sitting/lateral leans;sit to/from stand Additional ADL Goal #1: Patient will identify at least 3 fall prevention strategies to employ at home in order to maximize function and safety during ADLs and decrease caregiver burden while preventing possible injury and rehospitalization.  OT Frequency: Min 2X/week     Co-evaluation              AM-PAC OT "6 Clicks" Daily Activity     Outcome Measure Help from another person eating meals?: None Help from another person taking care of personal grooming?: A Little Help from another person toileting, which includes using toliet, bedpan, or urinal?: A Little Help from another person bathing (including washing, rinsing, drying)?: A Little Help from another person to put on and taking off regular upper body clothing?: A Little Help from another person to put on and taking off regular lower body clothing?: A Little 6 Click Score: 19   End of Session Equipment Utilized During Treatment: Rolling walker (2 wheels);Gait belt Nurse Communication: Weight bearing status;Precautions;Other (comment) (LLE incision site weeping)  Activity Tolerance: Patient tolerated treatment well Patient left: in chair;with call bell/phone within reach;with chair alarm set;with family/visitor present  OT Visit Diagnosis: Unsteadiness on feet (R26.81);Other abnormalities of gait and mobility (R26.89);Muscle weakness (generalized) (M62.81);History of falling (Z91.81)                Time: 0258-5277 OT Time Calculation (min): 48 min Charges:  OT General Charges $OT Visit: 1 Visit OT Evaluation $OT Eval Low Complexity: 1 Low OT Treatments $Self Care/Home Management : 8-22 mins $Therapeutic Activity: 8-22 mins  Anderson Malta, OT Acute Rehab Services Office: [phone] 07/20/2022  Anderson Malta  Tajana Crotteau 07/20/2022, 11:38 AM

## 2022-07-20 NOTE — Evaluation (Signed)
Speech Language Pathology Evaluation Patient Details Name: Steve [name]. MRN: [id number] DOB: 1941/10/05 Today's Date: 07/20/2022 Time: 1455-1510 SLP Time Calculation (min) (ACUTE ONLY): 15 min  Problem List:  Patient Active Problem List   Diagnosis Date Noted   Cellulitis of left lower extremity 07/18/2022   Presbyopia 07/18/2022   Acute blood loss anemia 07/18/2022   Moderate protein malnutrition ([place]) 07/18/2022   GI bleed 07/18/2022   Hypoglycemia 07/18/2022   Chronic combined systolic and diastolic CHF (congestive heart failure) ([place]) 07/08/2022   Prolonged QT interval 07/03/2022   Sepsis due to undetermined organism ([place]) 06/25/2022   Dehydration with hyponatremia 06/25/2022   DKA (diabetic ketoacidoses) 04/16/2019   Atrial fibrillation ([place]) 12/15/2018   CKD (chronic kidney disease), symptom management only, stage 4 (severe) ([place]) 09/08/2018   Sepsis ([place]) 09/07/2018   Diabetic ulcer of toe of left foot associated with type 2 diabetes mellitus ([place])    Cellulitis of left anterior lower leg 07/25/2018   Iron deficiency anemia 07/24/2018   Acute kidney injury superimposed on chronic kidney disease ([place]) 07/24/2018   Hyponatremia 07/24/2018   Hypokalemia 27/74/1287   Metabolic acidosis 86/76/7209   Acute renal failure (ARF) ([place]) 07/24/2018   Normocytic anemia 07/24/2018   Thrombocytopenia ([place]) 07/24/2018   Ischemic cardiomyopathy    CAD (coronary artery disease)    PAF (paroxysmal atrial fibrillation) ([place])    Essential hypertension    Type II diabetes mellitus ([place])    Near syncope 04/01/2015   Dizziness and giddiness 04/01/2015   Fatigue 04/01/2015   Chronic renal disease, stage II 04/01/2015   RBBB 04/01/2015   Angina pectoris ([place]) 04/01/2015   PAT (paroxysmal atrial tachycardia) ([place]) 12/16/2011   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   Diabetes mellitus, type 2 ([place]) 05/31/2011   Hypertension    PVD (peripheral vascular disease) ([place])    Hyperlipidemia    Past Medical  History:  Past Medical History:  Diagnosis Date   CAD (coronary artery disease)    a. s/p CABG x 29 Nov 2011 with LIMA to LAD, SVG to OM1 and distal LCX, SVG to ramus intermediate   Essential hypertension    Hyperlipidemia    Intermittent claudication ([place])    Ischemic cardiomyopathy    a. 03/2015 Echo: EF45-50%, Gr1 DD, mild MR.   PAF (paroxysmal atrial fibrillation) ([place])    a. post op atrial fib 11/2011; short course of amiodarone; stopped 01/25/12   Peripheral arterial disease ([place])    Right bundle branch block    Type II diabetes mellitus [facility])    Past Surgical History:  Past Surgical History:  Procedure Laterality Date   ABDOMINAL AORTOGRAM W/LOWER EXTREMITY Left 07/02/2022   Procedure: ABDOMINAL AORTOGRAM W/LOWER EXTREMITY;  Surgeon: Cherre Robins, MD;  Location: [facility];  Service: Cardiovascular;  Laterality: Left;   AMPUTATION Left 07/09/2022   Procedure: PARTIAL LEFT FIFTH RAY AMPUTATION;  Surgeon: Edrick Kins, DPM;  Location: [facility];  Service: Podiatry;  Laterality: Left;   BYPASS GRAFT FEMORAL-PERONEAL Left 07/07/2022   Procedure: LEFT FEMORAL-PERONEAL ARTERY BYPASS;  Surgeon: Cherre Robins, MD;  Location: [facility] OR;  Service: Vascular;  Laterality: Left;   CARDIAC CATHETERIZATION  01/16/1999   Est. EF of 65% -- Nonobstruction atherosclerotic coronary artery disease -- Normal left ventricular function       CARDIAC ELECTROPHYSIOLOGY STUDY AND ABLATION  05/21/1999   Normal sinus funtion -- Mildly prolonged interatrial conduction times -- Normal A-V node funtion -- Normal His Purkinje system function --  fNo accessory pathway -- No inducible ventricular tachycardia in the presence of the or in the absence of isoproterenol with programmed stimulation or with burst pacing -- Nikki Dom, M.D. [place]   CORONARY ARTERY BYPASS GRAFT  12/10/2011   Procedure: CORONARY ARTERY BYPASS GRAFTING (CABG);  Surgeon: Tharon Aquas Adelene Idler, MD;   Location: [facility];  Service: Open Heart Surgery;  Laterality: N/A;  Coronary Artery bypass graft on pump times four utilizing left internal mammary artery and left saphenous vein harvested endoscopically   LEFT HEART CATH AND CORS/GRAFTS ANGIOGRAPHY N/A 12/18/2018   Procedure: LEFT HEART CATH AND CORS/GRAFTS ANGIOGRAPHY;  Surgeon: Belva Crome, MD;  Location: [facility];  Service: Cardiovascular;  Laterality: N/A;   LEFT HEART CATHETERIZATION WITH CORONARY ANGIOGRAM N/A 12/09/2011   Procedure: LEFT HEART CATHETERIZATION WITH CORONARY ANGIOGRAM;  Surgeon: Peter M Martinique, MD;  [facility] CATH LAB;  Service: Cardiovascular;  Laterality: N/A;   LOWER EXTREMITY ANGIOGRAM Left 07/28/2018   Procedure: Lower Extremity Angiogram;  Surgeon: Angelia Mould, MD;  Location: [facility];  Service: Cardiovascular;  Laterality: Left;   HPI:  Patient is an 81 y.o. 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 He has been living at [facility] for the past three years. He presented to the hospital ED due to AMS with significant confusion and slurred speech. His son visited him on date of admission (07/18/22) and found that he was confused and EMS was called. At that time, son did state that patient may need help with insulin administration in the future. In ED, patient was febrile at 100/5 degrees F,  became hypoglycemic during MD evaluation. UA was hazy with moderate hemoglobinuria, fecal occult blood test was positive, portable chest x-ray was negative for acute findings, CT head showed chronic microvascular ischemic changes and cerebral  volume loss, but no acute intracranial abnormality and MRI brain reported Two distinct punctate foci of restricted diffusion involving the white matter of the left corona radiata, consistent with small acute ischemic infarcts. No associated hemorrhage or mass effect.   Assessment / Plan / Recommendation Clinical Impression  Patient assessed for cognitive-linguistic and speech function and appears to be WNL. Speech was 100% intelligible, no facial assymetry or weakness and no oral motor weakness. Patient was fully oriented, exhibited good recall of recent therapeutic and medical interventions and is demonstrating good awareness overall. He reported he is in agreement with his son to change from ILF to ALF (remaning at same facility-[facility]) but when SLP mentioned son anticipating he would need help with his insulin he said, "we'll see about that". SLP not recommending further intervention as patient appears at his baseline functioning and appears largely independent for cogntive function.    SLP Assessment  SLP Recommendation/Assessment: Patient does not need any further Speech Lanaguage Pathology Services    Recommendations for follow up therapy are one component of a multi-disciplinary discharge planning process, led by the attending physician.  Recommendations may be updated based on patient status, additional functional criteria and insurance authorization.    Follow Up Recommendations  No SLP follow up    Assistance Recommended  at Discharge  None  Functional Status Assessment Patient has had a recent decline in their functional status and demonstrates the ability to make significant improvements in function in a reasonable and predictable amount of time.  Frequency and Duration           SLP Evaluation Cognition  Overall Cognitive Status: Within Functional Limits for tasks assessed Arousal/Alertness: Awake/alert Orientation Level: Oriented X4       Comprehension  Auditory  Comprehension Overall Auditory Comprehension: Appears within functional limits for tasks assessed    Expression Expression Primary Mode of Expression: Verbal Verbal Expression Overall Verbal Expression: Appears within functional limits for tasks assessed Initiation: No impairment Repetition: No impairment Naming: No impairment Pragmatics: No impairment   Oral / Motor  Oral Motor/Sensory Function Overall Oral Motor/Sensory Function: Within functional limits Motor Speech Overall Motor Speech: Appears within functional limits for tasks assessed Respiration: Within functional limits Resonance: Within functional limits Articulation: Within functional limitis Intelligibility: Intelligible Motor Planning: Witnin functional limits Motor Speech Errors: Not applicable           Sonia Baller, MA, CCC-SLP Speech Therapy

## 2022-07-20 NOTE — Plan of Care (Signed)
  Problem: Fluid Volume: Goal: Hemodynamic stability will improve Outcome: Progressing   

## 2022-07-20 NOTE — Progress Notes (Signed)
[facility] Gastroenterology Progress Note  Steve [name]. 81 y.o. 02-16-1941   Subjective: Feels ok. Denies abdominal pain. Son at bedside.  Objective: Vital signs: Vitals:   07/20/22 0000 07/20/22 0343  BP: (!) 119/49 (!) 147/68  Pulse: 81 79  Resp: 19 19  Temp:  98.1 F (36.7 C)  SpO2: 94% 98%    Physical Exam: Gen: chronically ill-appearing, elderly, lethargic, no acute distress, thin HEENT: anicteric sclera CV: RRR Chest: CTA B Abd: soft, nontender, nondistended, +BS Ext: no edema  Lab Results: Recent Labs    07/19/22 0500 07/20/22 0329  NA 128* 126*  K 3.5 3.6  CL 99 101  CO2 18* 16*  GLUCOSE 127* 153*  BUN 39* 36*  CREATININE 2.36* 2.30*  CALCIUM 7.2* 7.5*   Recent Labs    07/18/22 1252 07/19/22 0500  AST 53* 25  ALT 21 18  ALKPHOS 56 44  BILITOT 1.5* 0.7  PROT 6.3* 5.4*  ALBUMIN 2.7* 2.4*   Recent Labs    07/18/22 1252 07/18/22 2036 07/19/22 0500 07/20/22 0329  WBC 8.5  --  8.2 7.8  NEUTROABS 7.7  --   --  6.4  HGB 8.0*   < > 7.2* 7.6*  HCT 23.9*   < > 21.7* 21.8*  MCV 90.9  --  91.6 88.6  PLT 111*  --  94* 95*   < > = values in this interval not displayed.      Assessment/Plan: GI bleed on Eliquis - no signs of ongoing bleeding. Eliquis on hold. EGD planned for today was postponed until tomorrow because patient received a clear liquid diet this morning. Continue IV PPI Q 12 hours. NPO p MN. EGD tomorrow morning.   [name] 07/20/2022, 10:24 AM  Questions please call [phone] ID: Steve Mask., Steve [name]   DOB: [birth year], 81 y.o.   MRN: [id number]

## 2022-07-20 NOTE — Consult Note (Signed)
Neurology Consultation Reason for Consult: Stroke Referring Physician: Olevia Bowens, D  CC: Altered mental status  History is obtained from: Patient  HPI: Steve [name]. is a 81 y.o. male with a history of paroxysmal atrial fibrillation who recently had vascular surgery presents with altered mental status and as part of this evaluation had an MRI which shows two small areas of infarct.  He was also found to have cellulitis of his surgical wound and he was started on antibiotics.  Since being started on antibiotics, his mental status is greatly improved and he feels like he is back to his normal self.  He denies any numbness or weakness.   LKW: Unclear tpa given?: no, unclear time of onset   Past Medical History:  Diagnosis Date   CAD (coronary artery disease)    a. s/p CABG x 29 Nov 2011 with LIMA to LAD, SVG to OM1 and distal LCX, SVG to ramus intermediate   Essential hypertension    Hyperlipidemia    Intermittent claudication ([place])    Ischemic cardiomyopathy    a. 03/2015 Echo: EF45-50%, Gr1 DD, mild MR.   PAF (paroxysmal atrial fibrillation) ([facility])    a. post op atrial fib 11/2011; short course of amiodarone; stopped 01/25/12   Peripheral arterial disease ([place])    Right bundle branch block    Type II diabetes mellitus ([place])      Family History  Problem Relation Age of Onset   Stroke Mother 25       Cerebellar hemorrhage     Social History:  reports that he quit smoking about 43 years ago. His smoking use included cigarettes. He has a 30.00 pack-year smoking history. He has quit using smokeless tobacco. He reports current alcohol use of about 4.0 standard drinks of alcohol per week. He reports that he does not use drugs.   Exam: Current vital signs: BP (!) 145/83 (BP Location: Right Arm)   Pulse 90   Temp 98 F (36.7 C) (Oral)   Resp 20   Ht 5\' 9"  (1.753 m)   Wt 85.3 kg   SpO2 97%   BMI 27.76 kg/m  Vital signs in last 24 hours: Temp:  [97.4 F (36.3 C)-98.8 F (37.1  C)] 98 F (36.7 C) (09/25 1758) Pulse Rate:  [78-91] 90 (09/25 1758) Resp:  [20-23] 20 (09/25 1758) BP: (137-165)/(49-83) 145/83 (09/25 1758) SpO2:  [95 %-100 %] 97 % (09/25 1758)   Physical Exam  Constitutional: Appears well-developed and well-nourished.  Psych: Affect appropriate to situation  Neuro: Mental Status: Patient is awake, alert, oriented to person, place, month, year, and situation. Patient is able to give a clear and coherent history. No signs of aphasia or neglect Cranial Nerves: II: Visual Fields are full. Pupils are equal, round, and reactive to light.   III,IV, VI: EOMI without ptosis or diploplia.  V: Facial sensation is symmetric to temperature VII: He has a decreased nasolabial fold on the left, reviewing previous epic picture this appears to be old VIII: hearing is intact to voice X: Uvula elevates symmetrically XI: Shoulder shrug is symmetric. XII: tongue is midline without atrophy or fasciculations.  Motor: Tone is normal. Bulk is normal. 5/5 strength was present in all four extremities.  Sensory: Sensation is symmetric to light touch and temperature in the arms and legs. Cerebellar: No clear ataxia but he does have a significant intentional tremor     I have reviewed labs in epic and the results pertinent to this consultation are:  Creatinine 2.36 A1c 7.9 on 9/1 Echo from 9/11-EF is 35 to 40% with global hypokinesis  I have reviewed the images obtained: MRI brain-two subcortical infarcts on the left  Impression: 81 year old male with two subcortical infarcts in the left.  Based on their location, I would favor concurrent small vessel disease.  My suspicion is that they are incidental, and his encephalopathy was more related to his cellulitis, currently this appears to be resolved.  Recommendations: 1) resume Eliquis once safe from GI perspective 2) lipid panel 3) MRA head and neck 4) PT, OT, ST 5) neurochecks 6) stroke team to  follow  Roland Rack, MD [facility] [phone]  If 7pm- 7am, please page neurology on call as listed in [facility].

## 2022-07-20 NOTE — Progress Notes (Addendum)
STROKE TEAM PROGRESS NOTE   INTERVAL HISTORY Patient is seen in his room with no family at the bedside.  He was admitted to the hospital with cellulitis of a surgical wound and altered mental status and found to have two small infarcts in the left corona radiata on brain MRI.  Patient's cellulitis has been treated with antibiotics, and his mental status is improved.  He reports no symptoms from the strokes.  He has a history of atrial fibrillation and is on Eliquis at home, but this is on hold for an endoscopy scheduled tomorrow to investigate a GI bleed. MRI scan of the brain shows tiny punctate left subcortical lacunar infarcts. Vitals:   07/20/22 0000 07/20/22 0343 07/20/22 1100 07/20/22 1608  BP: (!) 119/49 (!) 147/68  134/62  Pulse: 81 79  82  Resp: 19 19    Temp:  98.1 F (36.7 C) 97.8 F (36.6 C) 98 F (36.7 C)  TempSrc:  Temporal Oral Oral  SpO2: 94% 98%  95%  Weight:      Height:       CBC:  Recent Labs  Lab 07/18/22 1252 07/18/22 2036 07/19/22 0500 07/20/22 0329  WBC 8.5  --  8.2 7.8  NEUTROABS 7.7  --   --  6.4  HGB 8.0*   < > 7.2* 7.6*  HCT 23.9*   < > 21.7* 21.8*  MCV 90.9  --  91.6 88.6  PLT 111*  --  94* 95*   < > = values in this interval not displayed.   Basic Metabolic Panel:  Recent Labs  Lab 07/19/22 0500 07/20/22 0329  NA 128* 126*  K 3.5 3.6  CL 99 101  CO2 18* 16*  GLUCOSE 127* 153*  BUN 39* 36*  CREATININE 2.36* 2.30*  CALCIUM 7.2* 7.5*   Lipid Panel:  Recent Labs  Lab 07/20/22 0329  CHOL 74  TRIG 116  HDL 16*  CHOLHDL 4.6  VLDL 23  LDLCALC 35   HgbA1c: No results for input(s): "HGBA1C" in the last 168 hours. Urine Drug Screen: No results for input(s): "LABOPIA", "COCAINSCRNUR", "LABBENZ", "AMPHETMU", "THCU", "LABBARB" in the last 168 hours.  Alcohol Level No results for input(s): "ETH" in the last 168 hours.  IMAGING past 24 hours DG CHEST PORT 1 VIEW  Result Date: 07/20/2022 CLINICAL DATA:  Hyponatremia. EXAM: PORTABLE CHEST  1 VIEW COMPARISON:  Chest x-ray dated July 18, 2022. FINDINGS: Unchanged borderline cardiomegaly status post CABG. New diffuse interstitial thickening. No focal consolidation, pleural effusion, or pneumothorax. No acute osseous abnormality. IMPRESSION: 1. New mild interstitial pulmonary edema. Electronically Signed   By: Titus Dubin M.D.   On: 07/20/2022 11:00   MR ANGIO HEAD WO CONTRAST  Result Date: 07/20/2022 CLINICAL DATA:  81 year old male with altered mental status, TIA, evidence of punctate white matter ischemia in the left hemisphere on MRI 2 days ago. EXAM: MRA HEAD WITHOUT CONTRAST TECHNIQUE: Angiographic images of the Circle of Willis were acquired using MRA technique without intravenous contrast. COMPARISON:  Neck MRA today reported separately. Brain MRI 07/18/2022. CTA head and neck 05/20/2020. FINDINGS: Anterior circulation: Both ICA siphons are patent but irregular with evidence of calcified atherosclerosis as demonstrated on the prior CTA. Moderate to severe right siphon stenosis suspected in the supraclinoid segment on series 5, image 105. No significant left siphon stenosis. Both carotid termini remain patent. Dominant left and diminutive or absent right ACA A1 segments. No left A1 stenosis. Anterior communicating artery and visible ACA branches are stable  and within normal limits. Right MCA M1 segment and bifurcation remain patent. But multifocal Severe right MCA M2 and M3 branch stenoses appear progressed (series 1047, image 16). "Duplicated" left MCA M1 segment again noted. Chronic severe irregularity and stenoses in the region of the left MCA bifurcation appears similar to the 2021 CTA. Left MCA branches appear stable since that time. Posterior circulation: Antegrade flow in the posterior circulation with chronic severe distal right vertebral artery irregularity and stenosis (series 5, image 12). Less pronounced but up to moderate left vertebral V4 segment stenosis. Both distal  vertebral arteries, vertebrobasilar junction, and basilar artery remain patent. No significant basilar stenosis. Both PICA origins appear patent.SCA origins are patent. Multifocal mild to moderate left PCA irregularity and stenosis appears stable since the 2021 CTA. But right P1 and P2 segment tandem severe stenoses have progressed since that time (series 1035, image 10). Distal right PCA branches remain patent. Anatomic variants: "Duplicated" left MCA M1 segment. Dominant left and diminutive or absent right ACA A1. Other: No intracranial mass effect or ventriculomegaly. IMPRESSION: 1. Chronic severe intracranial atherosclerosis, stable since a 2021 CTA (see #2) with the following exceptions: - new tandem Severe tandem stenoses of the Right PCA. - new multifocal Severe Right MCA M2 and M3 branch stenoses. 2. Moderate to severe chronic Left MCA M2 stenoses. Moderate (Left) and severe (Right) distal vertebral artery stenoses. Moderate to severe supraclinoid Right ICA stenosis. Electronically Signed   By: Genevie Ann M.D.   On: 07/20/2022 08:46   MR ANGIO NECK WO CONTRAST  Result Date: 07/20/2022 CLINICAL DATA:  81 year old male with altered mental status, TIA, evidence of punctate white matter ischemia in the left hemisphere on MRI 2 days ago. EXAM: MRA NECK WITHOUT CONTRAST TECHNIQUE: Angiographic images of the neck were acquired using MRA technique without intravenous contrast. Carotid stenosis measurements (when applicable) are obtained utilizing NASCET criteria, using the distal internal carotid diameter as the denominator. COMPARISON:  Brain MRI 07/18/2022.  CTA head and neck 05/20/2020. FINDINGS: Aortic arch: Calcified aortic atherosclerosis. 3 vessel arch configuration demonstrated on the prior CTA. Right carotid system: Patent with antegrade flow signal to the skull base. Irregularity at the right carotid bifurcation corresponds to bulky atherosclerosis demonstrated by CTA in 2021. Hemodynamically significant  stenosis at that time (estimated at 75%). No definite flow gap on MRA today. Left carotid system: Antegrade flow to the skull base. Mild irregularity at the left carotid bifurcation corresponding to atherosclerosis demonstrated on prior CTA. No evidence of hemodynamically significant stenosis. Vertebral arteries: Codominant as on the prior CTA. Antegrade flow signal in both vertebral arteries to the skull base. Both vertebral artery origins were better demonstrated by CTA. Mild to moderate right V2 segment irregularity and stenosis redemonstrated on series 6, image 117. No evidence of left V2 stenosis. Other: None. IMPRESSION: 1. Chronic right carotid bifurcation atherosclerosis with hemodynamically significant proximal Right ICA stenosis (estimated at 75% by CTA in 2021). The right ICA remains patent. 2. Chronic left carotid atherosclerosis without evidence of hemodynamically significant stenosis. 3. Chronic vertebral artery atherosclerosis, more pronounced on the right and up to moderate in the right V2 segment. Both vertebral arteries remain patent to the skull base. Electronically Signed   By: Genevie Ann M.D.   On: 07/20/2022 08:37    PHYSICAL EXAM General:  Alert, well-nourished, well-developed, pale elderly patient in no acute distress with stapled incision on left calf  NEURO:  Mental Status: AA&Ox3  Speech/Language: speech is without dysarthria or aphasia.  Fluency,  and comprehension intact.  Cranial Nerves:  II: PERRL. Visual fields full.  III, IV, VI: EOMI. Eyelids elevate symmetrically.  V: Sensation is intact to light touch and symmetrical to face.  VII: Smile is symmetrical.   VIII: hearing intact to voice. IX, X: Phonation is normal.  GH:[id number] shrug 5/5. XII: tongue is midline without fasciculations. Motor: 5/5 strength to all muscle groups tested.  Tone: is normal and bulk is normal Sensation- Intact to light touch bilaterally.  Coordination: FTN intact bilaterally. No drift.   Gait- deferred    ASSESSMENT/PLAN [name]. is a 81 y.o. male with history of atrial fibrillation on Eliquis, PVD s/p recent vascular surgery on left calf with cellulitis of surgical wound presenting with altered mental status.  He was treated for his cellulitis with antibiotics, and his mental status is improved, but two small strokes in the left corona radiata were found on brian MRI.  Patient will have endoscopy tomorrow to investigate GI bleed, and his Eliquis is on hold.  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  MRI  two small acute infarcts in left corona radiata, atrophy and small vessel disease MRA  chronic severe intracranial atherosclerosis with right PCA stenosis and severe right MCA M2 and M3 stenoses, left MCA M2 stenosis, supraclinoid right ICA stenosis Carotid Doppler  pending 2D Echo EF 35-40%, LV hypokinesis, grade 2 diastolic dysfunction, moderately dilated left atrium, no atrial level shunt LDL 35 HgbA1c 7.9 VTE prophylaxis - SCDs    Diet   Diet clear liquid Room service appropriate? No; Fluid consistency: Thin   Diet NPO time specified   aspirin 81 mg daily and Eliquis (apixaban) daily prior to admission, now on No antithrombotic due to GI bleed.  Restart Eliquis when stable from GI standpoint Therapy recommendations:  SNF Disposition:  pending  Hypertension Home meds:  hydralazine 50 mg TID Stable Permissive hypertension (OK if < 220/120) but gradually normalize in 5-7 days Long-term BP goal normotensive  Hyperlipidemia Home meds:  atorvastatin 40 mg daily LDL 35, goal < 70 Continue statin at discharge  Diabetes type II Uncontrolled Home meds:  insulin detemir 18 units daily, metformin 1000 mg daily HgbA1c 7.9, goal < 7.0 CBGs Recent Labs    07/20/22 0455 07/20/22 0850 07/20/22 1204  GLUCAP 165* 153* 207*    SSI  Other Stroke  Risk Factors Advanced Age >/= 63  Former cigarette smoker  Other Active Problems Left lower extremity cellulitis Antibiotics per primary team GI bleed Upper GI endoscopy in AM Resume Eliquis when safe from [facility] day # [place] , MSN, AGACNP-BC [facility] See Amion for schedule and pager information 07/20/2022 4:36 PM   I have personally obtained history,examined this patient, reviewed notes, independently viewed imaging studies, participated in medical decision making and plan of care.ROS completed by me personally and pertinent positives fully documented  I have made any additions or clarifications directly to the above note. Agree with note above.  Patient had recent dental surgery for cellulitis treatment.  He was treated with antibiotics.  MRI scan shows tiny punctate left subcortical infarcts likely clinically silent.  Patient denies any focal symptoms suggestive of stroke.  He has history of A-fib and is on Eliquis and states he is compliant with it.  Recommend continue Eliquis and aggressive risk factor modification.  No  further stroke work-up is needed.  Patient may electively hold Eliquis for endoscopy if medically necessary with a small but acceptable periprocedural risk of TIA and stroke if patient is willing.  Greater than 50% time during this 50-minute appointment for further coordination of care of this with patient and care team and answering questions.  Stroke team will sign off.  Call for questions.  Antony Contras, MD Medical Director [place] Pager: [phone] 07/20/2022 5:37 PM   To contact Stroke Continuity provider, please refer to [email or web address] After hours, contact General Neurology

## 2022-07-20 NOTE — Plan of Care (Signed)
  Problem: Fluid Volume: Goal: Hemodynamic stability will improve Outcome: Progressing   Problem: Clinical Measurements: Goal: Diagnostic test results will improve Outcome: Progressing Goal: Signs and symptoms of infection will decrease Outcome: Progressing   Problem: Respiratory: Goal: Ability to maintain adequate ventilation will improve Outcome: Progressing   Problem: Education: Goal: Understanding of CV disease, CV risk reduction, and recovery process will improve Outcome: Progressing Goal: Individualized Educational Video(s) Outcome: Progressing   Problem: Activity: Goal: Ability to return to baseline activity level will improve Outcome: Progressing   Problem: Cardiovascular: Goal: Ability to achieve and maintain adequate cardiovascular perfusion will improve Outcome: Progressing Goal: Vascular access site(s) Level 0-1 will be maintained Outcome: Progressing   Problem: Health Behavior/Discharge Planning: Goal: Ability to safely manage health-related needs after discharge will improve Outcome: Progressing   Problem: Education: Goal: Knowledge of General Education information will improve Description: Including pain rating scale, medication(s)/side effects and non-pharmacologic comfort measures Outcome: Progressing   Problem: Health Behavior/Discharge Planning: Goal: Ability to manage health-related needs will improve Outcome: Progressing   Problem: Clinical Measurements: Goal: Ability to maintain clinical measurements within normal limits will improve Outcome: Progressing Goal: Will remain free from infection Outcome: Progressing Goal: Diagnostic test results will improve Outcome: Progressing Goal: Respiratory complications will improve Outcome: Progressing Goal: Cardiovascular complication will be avoided Outcome: Progressing   Problem: Activity: Goal: Risk for activity intolerance will decrease Outcome: Progressing   Problem: Nutrition: Goal: Adequate  nutrition will be maintained Outcome: Progressing   Problem: Coping: Goal: Level of anxiety will decrease Outcome: Progressing   Problem: Elimination: Goal: Will not experience complications related to bowel motility Outcome: Progressing Goal: Will not experience complications related to urinary retention Outcome: Progressing   Problem: Pain Managment: Goal: General experience of comfort will improve Outcome: Progressing   Problem: Safety: Goal: Ability to remain free from injury will improve Outcome: Progressing   Problem: Skin Integrity: Goal: Risk for impaired skin integrity will decrease Outcome: Progressing   Problem: Clinical Measurements: Goal: Ability to avoid or minimize complications of infection will improve Outcome: Progressing   Problem: Skin Integrity: Goal: Skin integrity will improve Outcome: Progressing   Problem: Education: Goal: Knowledge of disease or condition will improve Outcome: Progressing Goal: Knowledge of secondary prevention will improve (SELECT ALL) Outcome: Progressing Goal: Knowledge of patient specific risk factors will improve (INDIVIDUALIZE FOR PATIENT) Outcome: Progressing Goal: Individualized Educational Video(s) Outcome: Progressing   Problem: Coping: Goal: Will verbalize positive feelings about self Outcome: Progressing Goal: Will identify appropriate support needs Outcome: Progressing   Problem: Health Behavior/Discharge Planning: Goal: Ability to manage health-related needs will improve Outcome: Progressing   Problem: Self-Care: Goal: Ability to participate in self-care as condition permits will improve Outcome: Progressing Goal: Verbalization of feelings and concerns over difficulty with self-care will improve Outcome: Progressing Goal: Ability to communicate needs accurately will improve Outcome: Progressing   Problem: Nutrition: Goal: Risk of aspiration will decrease Outcome: Progressing Goal: Dietary intake  will improve Outcome: Progressing   Problem: Intracerebral Hemorrhage Tissue Perfusion: Goal: Complications of Intracerebral Hemorrhage will be minimized Outcome: Progressing   Problem: Ischemic Stroke/TIA Tissue Perfusion: Goal: Complications of ischemic stroke/TIA will be minimized Outcome: Progressing   Problem: Spontaneous Subarachnoid Hemorrhage Tissue Perfusion: Goal: Complications of Spontaneous Subarachnoid Hemorrhage will be minimized Outcome: Progressing

## 2022-07-20 NOTE — Evaluation (Signed)
Physical Therapy Evaluation Patient Details Name: Steve [name]. MRN: [id number] DOB: 07/10/1941 Today's Date: 07/20/2022  History of Present Illness  Pt is an 81 y.o. male recently admitted from [facility] on 06/25/22 with AKI, hyponatremia, sepsis, L foot nonhealing wound. S/p aortogram, LLE angiogram 9/8. 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 WBAT RLE in Post-op shoe. Pt returned home on 9/19. Pt brought to [facility] ED on 9/24 after son found pt in his chair with AMS. MRI revealved "Two distinct punctate foci of restricted diffusion involving the  white matter of the left corona radiata, consistent with small acute  ischemic infarcts. No associated hemorrhage or mass effect." and pt also found to have UTI and BGL in the 50s.  PMH includes CAD s/p CABG, HTN, HLD, DM, PAD, afib.  Clinical Impression   Pt presents with generalized weakness, impaired safety awareness, close guard for safety, impaired balance, and decreased activity tolerance. Pt to benefit from acute PT to address deficits. Pt ambulated short hallway dsitance with use of RW and close guard for safety. Anticipate pt will need SNF level of care post-acutely.  PT to progress mobility as tolerated, and will continue to follow acutely.         Recommendations for follow up therapy are one component of a multi-disciplinary discharge planning process, led by the attending physician.  Recommendations may be updated based on patient status, additional functional criteria and insurance authorization.  Follow Up Recommendations Skilled nursing-short term rehab (<3 hours/day) Can patient physically be transported by private vehicle: No    Assistance Recommended at Discharge PRN  Patient can return home with the following  Assistance with cooking/housework;Assist for transportation;Help with stairs or ramp for entrance;A little help with walking and/or transfers    Equipment Recommendations None  recommended by PT  Recommendations for Other Services       Functional Status Assessment Patient has had a recent decline in their functional status and demonstrates the ability to make significant improvements in function in a reasonable and predictable amount of time.     Precautions / Restrictions Precautions Precautions: Fall Required Braces or Orthoses: Other Brace Other Brace: post op shoe - per podiatry via secure chat with Leonarda Salon last admission, pt okay to WB through heel for gait Restrictions Weight Bearing Restrictions: No LLE Weight Bearing: Weight bearing as tolerated Other Position/Activity Restrictions: in post op shoe for transitions only per podiatry (per previous evaluation, however pt reported that he resumed ambulation with his rollator after discharge.).      Mobility  Bed Mobility Overal bed mobility: Needs Assistance Bed Mobility: Sit to Supine       Sit to supine: Min assist   General bed mobility comments: assist for boost up    Transfers Overall transfer level: Needs assistance Equipment used: Rolling walker (2 wheels) Transfers: Sit to/from Stand Sit to Stand: Min guard           General transfer comment: for safety    Ambulation/Gait Ambulation/Gait assistance: Min guard Gait Distance (Feet): 80 Feet Assistive device: Rolling walker (2 wheels) Gait Pattern/deviations: Step-through pattern, Decreased stride length, Antalgic Gait velocity: decr     General Gait Details: use of post-op shoe with heel to encourage heel WB, clsoe guard for safety  Stairs            Wheelchair Mobility    Modified Rankin (Stroke Patients Only)       Balance Overall balance  assessment: History of Falls, Needs assistance Sitting-balance support: No upper extremity supported, Feet supported Sitting balance-Leahy Scale: Fair     Standing balance support: Bilateral upper extremity supported, Reliant on assistive device for balance Standing  balance-Leahy Scale: Poor Standing balance comment: reliant on external support                             Pertinent Vitals/Pain Pain Assessment Pain Assessment: No/denies pain Pain Intervention(s): Monitored during session    Home Living Family/patient expects to be discharged to:: Skilled nursing facility Living Arrangements: Alone Available Help at Discharge: Personal care attendant Type of Home: Independent living facility                  Prior Function Prior Level of Function : Needs assist       Physical Assist : Mobility (physical);ADLs (physical) Mobility (physical): Gait;Transfers ADLs (physical): Bathing;Dressing;Toileting;IADLs Mobility Comments: Pt returned home from hospital on 9/19 and son brought pt to his room in a wheelchair that [employer] provides. Pt had a fall in bathroom since being home and son found pt incoherent in chair on 9/24 with return [facility] ED. Pt endorsed feeling "unsteady" since return home. ADLs Comments: Pt reports struggle since going home on 9/19. Must do own laundry and walk to laundry room with Rollator. Son reports 100 yards to dining room that pt previously ambulated to with rollator without diffiuclty but now unable. Pt reports that he was able to do "nothing" and struggled to care for self.     Hand Dominance   Dominant Hand: Right    Extremity/Trunk Assessment   Upper Extremity Assessment Upper Extremity Assessment: Defer to OT evaluation    Lower Extremity Assessment Lower Extremity Assessment: Generalized weakness;LLE deficits/detail LLE Deficits / Details: min serosanguinous weeping from bypass site, RN aware    Cervical / Trunk Assessment Cervical / Trunk Assessment: Kyphotic  Communication   Communication: No difficulties  Cognition Arousal/Alertness: Awake/alert Behavior During Therapy: WFL for tasks assessed/performed Overall Cognitive Status: Within Functional Limits for tasks assessed                                           General Comments      Exercises     Assessment/Plan    PT Assessment Patient needs continued PT services  PT Problem List Decreased strength;Decreased mobility;Decreased activity tolerance;Decreased balance;Decreased knowledge of use of DME;Pain;Decreased safety awareness;Decreased cognition       PT Treatment Interventions DME instruction;Gait training;Functional mobility training;Therapeutic activities;Therapeutic exercise;Balance training;Patient/family education    PT Goals (Current goals can be found in the Care Plan section)  Acute Rehab PT Goals PT Goal Formulation: With patient Time For Goal Achievement: 07/20/22 Potential to Achieve Goals: Good    Frequency Min 3X/week     Co-evaluation               AM-PAC PT "6 Clicks" Mobility  Outcome Measure Help needed turning from your back to your side while in a flat bed without using bedrails?: A Little Help needed moving from lying on your back to sitting on the side of a flat bed without using bedrails?: A Little Help needed moving to and from a bed to a chair (including a wheelchair)?: A Little Help needed standing up from a chair using your arms (e.g., wheelchair or  bedside chair)?: A Little Help needed to walk in hospital room?: A Little Help needed climbing 3-5 steps with a railing? : A Lot 6 Click Score: 17    End of Session   Activity Tolerance: Patient tolerated treatment well Patient left: with call bell/phone within reach;in bed;with bed alarm set;Other (comment) (with Dr. Leonie Man at bedside) Nurse Communication: Mobility status PT Visit Diagnosis: Difficulty in walking, not elsewhere classified (R26.2)    Time: 8403-7543 PT Time Calculation (min) (ACUTE ONLY): 20 min   Charges:   PT Evaluation $PT Eval Low Complexity: 1 Low         Stacie Glaze, PT DPT Acute Rehabilitation Services Pager [phone]  Office [phone]   Roxine Caddy E  Ruffin Pyo 07/20/2022, 5:18 PM

## 2022-07-21 ENCOUNTER — Inpatient Hospital Stay (HOSPITAL_COMMUNITY): Payer: Medicare Other | Admitting: Certified Registered"

## 2022-07-21 ENCOUNTER — Inpatient Hospital Stay (HOSPITAL_COMMUNITY): Payer: Medicare Other

## 2022-07-21 ENCOUNTER — Encounter (HOSPITAL_COMMUNITY)
Admission: EM | Disposition: A | Discharge status: Skilled Nursing Facility | Payer: Self-pay | Source: Skilled Nursing Facility | Attending: Internal Medicine

## 2022-07-21 ENCOUNTER — Encounter (HOSPITAL_COMMUNITY): Payer: Self-pay | Admitting: Internal Medicine

## 2022-07-21 DIAGNOSIS — A419 Sepsis, unspecified organism: Secondary | ICD-10-CM | POA: Diagnosis not present

## 2022-07-21 DIAGNOSIS — I2510 Atherosclerotic heart disease of native coronary artery without angina pectoris: Secondary | ICD-10-CM

## 2022-07-21 DIAGNOSIS — I130 Hypertensive heart and chronic kidney disease with heart failure and stage 1 through stage 4 chronic kidney disease, or unspecified chronic kidney disease: Secondary | ICD-10-CM

## 2022-07-21 DIAGNOSIS — K2901 Acute gastritis with bleeding: Secondary | ICD-10-CM

## 2022-07-21 DIAGNOSIS — Z87891 Personal history of nicotine dependence: Secondary | ICD-10-CM

## 2022-07-21 DIAGNOSIS — I6523 Occlusion and stenosis of bilateral carotid arteries: Secondary | ICD-10-CM

## 2022-07-21 DIAGNOSIS — I509 Heart failure, unspecified: Secondary | ICD-10-CM

## 2022-07-21 DIAGNOSIS — I639 Cerebral infarction, unspecified: Secondary | ICD-10-CM

## 2022-07-21 DIAGNOSIS — D62 Acute posthemorrhagic anemia: Secondary | ICD-10-CM

## 2022-07-21 DIAGNOSIS — D631 Anemia in chronic kidney disease: Secondary | ICD-10-CM

## 2022-07-21 DIAGNOSIS — L03116 Cellulitis of left lower limb: Secondary | ICD-10-CM | POA: Diagnosis not present

## 2022-07-21 DIAGNOSIS — N189 Chronic kidney disease, unspecified: Secondary | ICD-10-CM

## 2022-07-21 HISTORY — PX: ESOPHAGOGASTRODUODENOSCOPY: SHX5428

## 2022-07-21 HISTORY — PX: BIOPSY: SHX5522

## 2022-07-21 LAB — COMPREHENSIVE METABOLIC PANEL
ALT: 22 U/L (ref 0–44)
AST: 27 U/L (ref 15–41)
Albumin: 2.2 g/dL — ABNORMAL LOW (ref 3.5–5.0)
Alkaline Phosphatase: 49 U/L (ref 38–126)
Anion gap: 10 (ref 5–15)
BUN: 29 mg/dL — ABNORMAL HIGH (ref 8–23)
CO2: 18 mmol/L — ABNORMAL LOW (ref 22–32)
Calcium: 7.9 mg/dL — ABNORMAL LOW (ref 8.9–10.3)
Chloride: 102 mmol/L (ref 98–111)
Creatinine, Ser: 2.04 mg/dL — ABNORMAL HIGH (ref 0.61–1.24)
GFR, Estimated: 32 mL/min — ABNORMAL LOW (ref >60)
Glucose, Bld: 132 mg/dL — ABNORMAL HIGH (ref 70–99)
Potassium: 3.4 mmol/L — ABNORMAL LOW (ref 3.5–5.1)
Sodium: 130 mmol/L — ABNORMAL LOW (ref 135–145)
Total Bilirubin: 0.9 mg/dL (ref 0.3–1.2)
Total Protein: 5.4 g/dL — ABNORMAL LOW (ref 6.5–8.1)

## 2022-07-21 LAB — CBC WITH DIFFERENTIAL/PLATELET
Abs Immature Granulocytes: 0.04 10*3/uL (ref 0.00–0.07)
Basophils Absolute: 0 10*3/uL (ref 0.0–0.1)
Basophils Relative: 0 %
Eosinophils Absolute: 0.2 10*3/uL (ref 0.0–0.5)
Eosinophils Relative: 3 %
HCT: 21.2 % — ABNORMAL LOW (ref 39.0–52.0)
Hemoglobin: 7.3 g/dL — ABNORMAL LOW (ref 13.0–17.0)
Immature Granulocytes: 1 %
Lymphocytes Relative: 18 %
Lymphs Abs: 1.1 10*3/uL (ref 0.7–4.0)
MCH: 30.9 pg (ref 26.0–34.0)
MCHC: 34.4 g/dL (ref 30.0–36.0)
MCV: 89.8 fL (ref 80.0–100.0)
Monocytes Absolute: 0.3 10*3/uL (ref 0.1–1.0)
Monocytes Relative: 5 %
Neutro Abs: 4.5 10*3/uL (ref 1.7–7.7)
Neutrophils Relative %: 73 %
Platelets: 99 10*3/uL — ABNORMAL LOW (ref 150–400)
RBC: 2.36 MIL/uL — ABNORMAL LOW (ref 4.22–5.81)
RDW: 16.3 % — ABNORMAL HIGH (ref 11.5–15.5)
WBC: 6.2 10*3/uL (ref 4.0–10.5)
nRBC: 0 % (ref 0.0–0.2)

## 2022-07-21 LAB — PREPARE RBC (CROSSMATCH)

## 2022-07-21 LAB — GLUCOSE, CAPILLARY
Glucose-Capillary: 138 mg/dL — ABNORMAL HIGH (ref 70–99)
Glucose-Capillary: 139 mg/dL — ABNORMAL HIGH (ref 70–99)
Glucose-Capillary: 145 mg/dL — ABNORMAL HIGH (ref 70–99)
Glucose-Capillary: 140 mg/dL — ABNORMAL HIGH (ref 70–99)
Glucose-Capillary: 135 mg/dL — ABNORMAL HIGH (ref 70–99)
Glucose-Capillary: 123 mg/dL — ABNORMAL HIGH (ref 70–99)

## 2022-07-21 LAB — OSMOLALITY, URINE: Osmolality, Ur: 313 mOsm/kg (ref 300–900)

## 2022-07-21 LAB — PHOSPHORUS: Phosphorus: 3.7 mg/dL (ref 2.5–4.6)

## 2022-07-21 LAB — C-REACTIVE PROTEIN: CRP: 3.8 mg/dL — ABNORMAL HIGH (ref <1.0)

## 2022-07-21 LAB — OSMOLALITY: Osmolality: 277 mOsm/kg (ref 275–295)

## 2022-07-21 LAB — MAGNESIUM: Magnesium: 1.7 mg/dL (ref 1.7–2.4)

## 2022-07-21 SURGERY — EGD (ESOPHAGOGASTRODUODENOSCOPY)
Anesthesia: Monitor Anesthesia Care

## 2022-07-21 MED ORDER — MAGNESIUM SULFATE 2 GM/50ML IV SOLN
2.0000 g | Freq: Once | INTRAVENOUS | Status: AC
  Administered 2022-07-21: 2 g via INTRAVENOUS
  Filled 2022-07-21: qty 50

## 2022-07-21 MED ORDER — PROPOFOL 500 MG/50ML IV EMUL
INTRAVENOUS | Status: DC | PRN
  Administered 2022-07-21: 125 ug/kg/min via INTRAVENOUS

## 2022-07-21 MED ORDER — LIDOCAINE HCL (CARDIAC) PF 100 MG/5ML IV SOSY
PREFILLED_SYRINGE | INTRAVENOUS | Status: DC | PRN
  Administered 2022-07-21: 100 mg via INTRAVENOUS

## 2022-07-21 MED ORDER — FUROSEMIDE 10 MG/ML IJ SOLN: 40.0000 mg | Freq: Two times a day (BID) | INTRAMUSCULAR | Status: DC

## 2022-07-21 MED ORDER — PROPOFOL 10 MG/ML IV BOLUS
INTRAVENOUS | Status: DC | PRN
  Administered 2022-07-21: 20 mg via INTRAVENOUS
  Administered 2022-07-21: 30 mg via INTRAVENOUS

## 2022-07-21 MED ORDER — CEFADROXIL 500 MG PO CAPS
500.0000 mg | ORAL_CAPSULE | Freq: Two times a day (BID) | ORAL | Status: DC
  Administered 2022-07-23 – 2022-07-26 (×7): 500 mg via ORAL
  Filled 2022-07-21 (×8): qty 1

## 2022-07-21 MED ORDER — POTASSIUM CHLORIDE 10 MEQ/100ML IV SOLN
10.0000 meq | INTRAVENOUS | Status: AC
  Administered 2022-07-21 (×2): 10 meq via INTRAVENOUS
  Filled 2022-07-21 (×2): qty 100

## 2022-07-21 MED ORDER — SODIUM CHLORIDE 0.9 % IV SOLN: INTRAVENOUS | Status: DC

## 2022-07-21 MED ORDER — DOXYCYCLINE HYCLATE 100 MG PO TABS
100.0000 mg | ORAL_TABLET | Freq: Two times a day (BID) | ORAL | Status: DC
  Administered 2022-07-23 – 2022-07-26 (×7): 100 mg via ORAL
  Filled 2022-07-21 (×7): qty 1

## 2022-07-21 MED ORDER — TAMSULOSIN HCL 0.4 MG PO CAPS
0.4000 mg | ORAL_CAPSULE | Freq: Every day | ORAL | Status: DC
  Administered 2022-07-21 – 2022-07-26 (×6): 0.4 mg via ORAL
  Filled 2022-07-21 (×6): qty 1

## 2022-07-21 MED ORDER — ETOMIDATE 2 MG/ML IV SOLN
INTRAVENOUS | Status: DC | PRN
  Administered 2022-07-21: 4 mg via INTRAVENOUS

## 2022-07-21 MED ORDER — DIPHENHYDRAMINE HCL 50 MG/ML IJ SOLN: 25.0000 mg | Freq: Four times a day (QID) | INTRAMUSCULAR | Status: DC | PRN

## 2022-07-21 MED ORDER — BUTAMBEN-TETRACAINE-BENZOCAINE 2-2-14 % EX AERO
INHALATION_SPRAY | CUTANEOUS | Status: DC | PRN
  Administered 2022-07-21: 1 via TOPICAL

## 2022-07-21 MED ORDER — SODIUM CHLORIDE 0.9% IV SOLUTION: Freq: Once | INTRAVENOUS | Status: AC

## 2022-07-21 MED ORDER — CEFAZOLIN SODIUM-DEXTROSE 2-4 GM/100ML-% IV SOLN
2.0000 g | Freq: Two times a day (BID) | INTRAVENOUS | Status: AC
  Administered 2022-07-21 – 2022-07-22 (×4): 2 g via INTRAVENOUS
  Filled 2022-07-21 (×4): qty 100

## 2022-07-21 MED ORDER — FUROSEMIDE 10 MG/ML IJ SOLN
60.0000 mg | Freq: Once | INTRAMUSCULAR | Status: AC
  Administered 2022-07-21: 60 mg via INTRAVENOUS
  Filled 2022-07-21 (×2): qty 6

## 2022-07-21 MED ORDER — POTASSIUM CHLORIDE CRYS ER 20 MEQ PO TBCR
40.0000 meq | EXTENDED_RELEASE_TABLET | Freq: Once | ORAL | Status: AC
  Administered 2022-07-21: 40 meq via ORAL
  Filled 2022-07-21: qty 2

## 2022-07-21 NOTE — Op Note (Signed)
[facility] Patient Name: Steve [name] Procedure Date : 07/21/2022 MRN: [id number] Attending MD: [name] , MD Date of Birth: 25-Apr-1941 CSN: [id number] Age: 81 Admit Type: Inpatient Procedure:                Upper GI endoscopy Indications:              Acute post hemorrhagic anemia, Melena Providers:                [name], MD, Grace Isaac, RN, Luan Moore, Technician, Brien Mates, RNFA Referring MD:             hospital team Medicines:                Propofol per Anesthesia, Monitored Anesthesia Care Complications:            No immediate complications. Estimated Blood Loss:     Estimated blood loss was minimal. Procedure:                Pre-Anesthesia Assessment:                           - Prior to the procedure, a History and Physical                            was performed, and patient medications and                            allergies were reviewed. The patient's tolerance of                            previous anesthesia was also reviewed. The risks                            and benefits of the procedure and the sedation                            options and risks were discussed with the patient.                            All questions were answered, and informed consent                            was obtained. Prior Anticoagulants: The patient has                            taken Eliquis (apixaban), last dose was stopped at                            admission. ASA Grade Assessment: III - A patient                            with severe systemic disease. After reviewing the  risks and benefits, the patient was deemed in                            satisfactory condition to undergo the procedure.                           After obtaining informed consent, the endoscope was                            passed under direct vision. Throughout the                             procedure, the patient's blood pressure, pulse, and                            oxygen saturations were monitored continuously. The                            [id number]-H190 ([id number]) Olympus endoscope was introduced                            through the mouth, and advanced to the second part                            of duodenum. The upper GI endoscopy was                            accomplished without difficulty. The patient                            tolerated the procedure well. Scope In: Scope Out: Findings:      The examined esophagus was normal.      The Z-line was regular and was found 38 cm from the incisors.      Segmental moderate inflammation characterized by congestion (edema),       erythema and linear erosions was found in the gastric antrum. Biopsies       were taken with a cold forceps for histology. Estimated blood loss was       minimal.      The cardia and gastric fundus were normal on retroflexion.      The examined duodenum was normal. Impression:               - Normal esophagus.                           - Z-line regular, 38 cm from the incisors.                           - Acute gastritis. Biopsied.                           - Normal examined duodenum. Recommendation:           - Advance diet as tolerated and full liquid diet.                           -  Await pathology results.                           - Observe patient's clinical course. Procedure Code(s):        --- Professional ---                           [phone], Esophagogastroduodenoscopy, flexible,                            transoral; with biopsy, single or multiple Diagnosis Code(s):        --- Professional ---                           K92.1, Melena (includes Hematochezia)                           K29.00, Acute gastritis without bleeding                           D62, Acute posthemorrhagic anemia CPT copyright 2019 [employer]. All rights reserved. The codes documented in this report  are preliminary and upon coder review may  be revised to meet current compliance requirements. [name], MD 07/21/2022 9:21:05 AM This report has been signed electronically. Number of Addenda: 0

## 2022-07-21 NOTE — Anesthesia Postprocedure Evaluation (Signed)
Anesthesia Post Note  Patient: Steve [name].  Procedure(s) Performed: ESOPHAGOGASTRODUODENOSCOPY (EGD) BIOPSY     Patient location during evaluation: Endoscopy Anesthesia Type: MAC Level of consciousness: awake Pain management: pain level controlled Vital Signs Assessment: post-procedure vital signs reviewed and stable Respiratory status: spontaneous breathing, nonlabored ventilation, respiratory function stable and patient connected to nasal cannula oxygen Cardiovascular status: stable and blood pressure returned to baseline Postop Assessment: no apparent nausea or vomiting Anesthetic complications: no   No notable events documented.  Last Vitals:  Vitals:   07/21/22 1206 07/21/22 1541  BP: (!) 146/66 (!) 150/68  Pulse: 71 73  Resp: 20 (!) 22  Temp: (!) 36.4 C (!) 36.3 C  SpO2: 100%     Last Pain:  Vitals:   07/21/22 1541  TempSrc: Axillary  PainSc:                  Karyl Kinnier Armonte Tortorella

## 2022-07-21 NOTE — Consult Note (Signed)
[facility]    Date of Admission:  07/18/2022     Reason for Consult: cellulitis/surgical site infection    Referring Provider: Lala Lund   Abx: 9/24-c vanc 9/24-c ceftriaxone        Assessment: 81 yo male hx CAD, PAD, ischemic cardiomypoathy, ckd3, pAfib on eliquis, dm2, recent left diabetic foot 5th toe om/cellulitis s/p fem-peroneal bypass then partial 5th ray, readmitted 9/24 with ongoing cellulitis/extension to distal LLE, aki on ckd, and gib/ams  Aki improving. ?multifactorial sepsis/prerenal-cardiorenal. It is a little far out from bypass to attribute to that  I spoke with vascular surgery team Dr Jamelle Haring, who related that there is a soft tissue u/s the weekend prior to admission that didn't see any fluid collection near the graft  Given this finding this appears to be all cellulitis, and I worry that his previous left foot cellulitis/osteomyelitis is not completely treated. There is no pathology on margin of the 5th metatarsal and culture.   It would be reasonable to focus tx on osteomyelitis for 4 more weeks with cefadroxil/doxycycline   Plan: -can continue iv abx for now, but would transition to cefazolin and vanc -if he is here beyond 2 more days and can take PO well, I would transition to doxy 100 mg bid and cefadroxil 500 bid -plan total abx 4 more weeks from 9/24 - 10/22 -I will see him in clinic f/u as below -will sign off -discussed with primary team   Clinic Follow Up Appt: 10/17 @ 1045  @  [facility] [place], [place], [place] [address] Phone: [phone]  I spent 75 minute reviewing data/chart, and coordinating care and >50% direct face to face time providing counseling/discussing diagnostics/treatment plan with patient   ------------------------------------------------ Principal Problem:   Cellulitis of left lower extremity Active Problems:   Hypertension   Hyperlipidemia    Diabetes mellitus, type 2 (HCC)   CAD (coronary artery disease)   PAF (paroxysmal atrial fibrillation) (HCC)   Normocytic anemia   Chronic combined systolic and diastolic CHF (congestive heart failure) ([place])   Acute blood loss anemia   Moderate protein malnutrition (HCC)   GI bleed   Hypoglycemia    HPI: Steve [name]. is a 81 y.o. male hx CAD, PAD, ischemic cardiomypoathy, ckd3, pAfib, dm2, recent left diabetic foot 5th toe om/cellulitis s/p fem-peroneal bypass then partial 5th ray, readmitted 9/24 with ongoing cellulitis/extension to distal LLE, aki on ckd, and gib/ams  Chart reviewed Discussed with patient who is a poor historian  9/13 fem-peroneal bypass with ptfe. Uncomplicated 8/84 partial left fith ray amputation primary closure. No path/culture  He was given 7 day more of cefadroxil/doxy on discharge 9/19. Unclear if taking  He represented on 9/24 with confusion but found to also have fever/aki and cellulitic change left foot and distal LLE.   Dr Roselie Awkward stated there was a recent u/s on the weekend that didn't see any fluid collection LLE  Bcx on admission was negative He was started on empiric bsAbx with improvement cellulitis  His aki is improving  Other issue: There was melanotic stool reported; gi following -- egd unrevealing 9/27; eliquis currently being hold  He denies any pain/discomfort in his left leg He has no other sx either  Family History  Problem Relation Age of Onset   Stroke Mother 39       Cerebellar hemorrhage    Social History   Tobacco  Use   Smoking status: Former    Packs/day: 1.50    Years: 20.00    Total pack years: 30.00    Types: Cigarettes    Quit date: 05/27/1979    Years since quitting: 43.1   Smokeless tobacco: Former  Scientific laboratory technician Use: Never used  Substance Use Topics   Alcohol use: Yes    Alcohol/week: 4.0 standard drinks of alcohol    Types: 4 Shots of liquor per week    Comment: 4 "canadian clubs each night"    Drug use: No    Allergies  Allergen Reactions   Januvia [Sitagliptin] Other (See Comments)    Soreness to stomach area. Intolerance    Statins Other (See Comments)    Myalgias, muscle weakness, swelling, pain- prescribed Lipitor in 06/2022 (??)    Review of Systems: ROS All Other ROS was negative, except mentioned above   Past Medical History:  Diagnosis Date   CAD (coronary artery disease)    a. s/p CABG x 29 Nov 2011 with LIMA to LAD, SVG to OM1 and distal LCX, SVG to ramus intermediate   Essential hypertension    Hyperlipidemia    Intermittent claudication ([place])    Ischemic cardiomyopathy    a. 03/2015 Echo: EF45-50%, Gr1 DD, mild MR.   PAF (paroxysmal atrial fibrillation) ([place])    a. post op atrial fib 11/2011; short course of amiodarone; stopped 01/25/12   Peripheral arterial disease ([place])    Right bundle branch block    Type II diabetes mellitus ([place])        Scheduled Meds:  sodium chloride   Intravenous Once   [START ON 07/22/2022] furosemide  40 mg Intravenous BID   furosemide  60 mg Intravenous Once   insulin aspart  0-5 Units Subcutaneous QHS   insulin aspart  0-6 Units Subcutaneous TID WC   isosorbide mononitrate  30 mg Oral Daily   metoprolol succinate  100 mg Oral Q supper   pantoprazole (PROTONIX) IV  40 mg Intravenous Q12H   potassium chloride  40 mEq Oral Once   tamsulosin  0.4 mg Oral Daily   Continuous Infusions:  cefTRIAXone (ROCEPHIN)  IV 1 g (07/20/22 1446)   vancomycin 1,500 mg (07/20/22 1538)   PRN Meds:.acetaminophen **OR** acetaminophen, diphenhydrAMINE, LORazepam, prochlorperazine   OBJECTIVE: Blood pressure (!) 149/69, pulse 74, temperature 97.6 F (36.4 C), temperature source Oral, resp. rate 18, height 5\' 9"  (1.753 m), weight 85.3 kg, SpO2 100 %.  Physical Exam General/constitutional: no distress, pleasant HEENT: Normocephalic, PER, Conj Clear, EOMI, Oropharynx clear Neck supple CV: rrr no mrg Lungs: clear to auscultation,  normal respiratory effort Abd: Soft, Nontender Ext: no edema Skin: see pics (improved since admission) Neuro: nonfocal MSK: no peripheral joint swelling/tenderness/warmth; back spines nontender  9/24      9/27      Lab Results Lab Results  Component Value Date   WBC 6.2 07/21/2022   HGB 7.3 (L) 07/21/2022   HCT 21.2 (L) 07/21/2022   MCV 89.8 07/21/2022   PLT 99 (L) 07/21/2022    Lab Results  Component Value Date   CREATININE 2.04 (H) 07/21/2022   BUN 29 (H) 07/21/2022   NA 130 (L) 07/21/2022   K 3.4 (L) 07/21/2022   CL 102 07/21/2022   CO2 18 (L) 07/21/2022    Lab Results  Component Value Date   ALT 22 07/21/2022   AST 27 07/21/2022   ALKPHOS 49 07/21/2022   BILITOT 0.9 07/21/2022  Microbiology: Recent Results (from the past 240 hour(s))  Culture, blood (Routine x 2)     Status: None (Preliminary result)   Collection Time: 07/18/22 12:52 PM   Specimen: BLOOD  Result Value Ref Range Status   Specimen Description   Final    BLOOD SITE NOT SPECIFIED Performed at [facility] [address], [place], [place] [address]    Special Requests   Final    BOTTLES DRAWN AEROBIC AND ANAEROBIC Blood Culture results may not be optimal due to an inadequate volume of blood received in culture bottles Performed at [facility] [address], [place], [place] [address]    Culture   Final    NO GROWTH 3 DAYS Performed at [facility], [place] [address] [place], [place] [address]    Report Status PENDING  Incomplete  Remove and replace urinary cath (placed > 5 days) then obtain urine culture from new indwelling urinary catheter.     Status: None   Collection Time: 07/18/22  1:52 PM   Specimen: Urine, Catheterized  Result Value Ref Range Status   Specimen Description   Final    URINE, CATHETERIZED Performed at [facility] [address] [place], [place] [address]    Special Requests   Final     NONE Performed at [facility], [place] [address] [place], [place] [address]    Culture   Final    NO GROWTH Performed at [facility], [place] [address] [place],  [address]    Report Status 07/19/2022 FINAL  Final  Resp Panel by RT-PCR (Flu A&B, Covid) Anterior Nasal Swab     Status: None   Collection Time: 07/18/22  2:02 PM   Specimen: Anterior Nasal Swab  Result Value Ref Range Status   SARS Coronavirus 2 by RT PCR NEGATIVE NEGATIVE Final    Comment: (NOTE) SARS-CoV-2 target nucleic acids are NOT DETECTED.  The SARS-CoV-2 RNA is generally detectable in upper respiratory specimens during the acute phase of infection. The lowest concentration of SARS-CoV-2 viral copies this assay can detect is 138 copies/mL. A negative result does not preclude SARS-Cov-2 infection and should not be used as the sole basis for treatment or other patient management decisions. A negative result may occur with  improper specimen collection/handling, submission of specimen other than nasopharyngeal swab, presence of viral mutation(s) within the areas targeted by this assay, and inadequate number of viral copies(<138 copies/mL). A negative result must be combined with clinical observations, patient history, and epidemiological information. The expected result is Negative.  Fact Sheet for Patients:  EntrepreneurPulse.com.au  Fact Sheet for Healthcare Providers:  IncredibleEmployment.be  This test is no t yet approved or cleared by the [place] FDA and  has been authorized for detection and/or diagnosis of SARS-CoV-2 by FDA under an Emergency Use Authorization (EUA). This EUA will remain  in effect (meaning this test can be used) for the duration of the COVID-19 declaration under Section 564(b)(1) of the Act, 21 U.S.C.section 360bbb-3(b)(1), unless the authorization is terminated  or revoked sooner.       Influenza A by PCR  NEGATIVE NEGATIVE Final   Influenza B by PCR NEGATIVE NEGATIVE Final    Comment: (NOTE) The Xpert Xpress SARS-CoV-2/FLU/RSV plus assay is intended as an aid in the diagnosis of influenza from Nasopharyngeal swab specimens and should not be used as a sole basis for treatment. Nasal washings and aspirates are unacceptable for Xpert  Xpress SARS-CoV-2/FLU/RSV testing.  Fact Sheet for Patients: EntrepreneurPulse.com.au  Fact Sheet for Healthcare Providers: IncredibleEmployment.be  This test is not yet approved or cleared by the [place] FDA and has been authorized for detection and/or diagnosis of SARS-CoV-2 by FDA under an Emergency Use Authorization (EUA). This EUA will remain in effect (meaning this test can be used) for the duration of the COVID-19 declaration under Section 564(b)(1) of the Act, 21 U.S.C. section 360bbb-3(b)(1), unless the authorization is terminated or revoked.  Performed at [facility], [place] [address] [place], [place] [address]   Culture, blood (Routine x 2)     Status: None (Preliminary result)   Collection Time: 07/19/22  6:21 PM   Specimen: BLOOD LEFT ARM  Result Value Ref Range Status   Specimen Description BLOOD LEFT ARM  Final   Special Requests   Final    BOTTLES DRAWN AEROBIC AND ANAEROBIC Blood Culture adequate volume   Culture   Final    NO GROWTH 2 DAYS Performed at [facility], [address], [place], [place] [address]    Report Status PENDING  Incomplete     Serology:    Imaging: If present, new imagings (plain films, ct scans, and mri) have been personally visualized and interpreted; radiology reports have been reviewed. Decision making incorporated into the Impression / Recommendations.  9/03 mri left foot Lateral forefoot soft tissue ulcer with adjacent osteomyelitis of the fifth metatarsal head/neck and early osteomyelitis of the fifth digit proximal phalanx. No  evidence of soft tissue abscess.    Jabier Mutton, [facility] [place] [phone] pager    07/21/2022, 11:36 AM

## 2022-07-21 NOTE — Interval H&P Note (Signed)
History and Physical Interval Note:  07/21/2022 8:33 AM  Steve [name].  has presented today for surgery, with the diagnosis of anemia, heme positive stools.  The various methods of treatment have been discussed with the patient and family. After consideration of risks, benefits and other options for treatment, the patient has consented to  Procedure(s): ESOPHAGOGASTRODUODENOSCOPY (EGD) (N/A) as a surgical intervention.  The patient's history has been reviewed, patient examined, no change in status, stable for surgery.  I have reviewed the patient's chart and labs.  Questions were answered to the patient's satisfaction.     [name]

## 2022-07-21 NOTE — Progress Notes (Addendum)
PROGRESS NOTE                                                                                                                                                                                                             Patient Demographics:    Steve [name], is a 81 y.o. male, DOB - Jan 28, 1941, EEF:[id number]  Outpatient Primary MD for the patient is System, Provider Not In    LOS - 3  Admit date - 07/18/2022    Chief Complaint  Patient presents with   Code Sepsis       Brief Narrative (HPI from H&P)   81 y.o. male with PMH significant for DM2, HTN, HLD, PAD with intermittent claudication, CAD/CABG/stents, ischemic cardiomyopathy, paroxysmal A-fib on Eliquis, right bundle branch block. Recently hospitalized 9/1 to 9/19 for sepsis secondary to left foot cellulitis/osteomyelitis 07/07/22, underwent left common femoral to peroneal bypass with PTFE by vascular surgeon Dr. Stanford Breed, 07/09/22, underwent fifth toe amputation by podiatry 07/13/22.   He was discharged back to independent living at [facility] on 07/13/22 with an indwelling Foley catheter for urinary retention.  He had received IV antibiotics during the stay and was discharged on oral doxycycline. Post discharge, patient continued to have pain at the surgical site.  He presented back from independent living to ER on 07/18/2022 with fever, shortness of breath, was diagnosed with left leg surgical site cellulitis, possible new CVA, hypoglycemia and was admitted to the hospital.   Subjective:    Steve [name] today has, No headache, No chest pain, No abdominal pain - No Nausea, No new weakness tingling or numbness, mild shortness of breath and orthopnea.   Assessment  & Plan :    Left leg cellulitis with recent left common femoral, peroneal artery bypass with 6 mm PTFE subcutaneous tunnel, recent left foot fifth ray amputation - HE has been seen by vascular surgery, currently on  IV empiric antibiotics with improvement in cellulitis.  Vascular surgery wants prolonged IV antibiotic course to avoid any extension of infection to the vascular graft, will consult ID and monitor.  Acute stroke with MRI showing 2 distinct punctate foci of restricted diffusion involving white matter of the left coronary radiata  - SEEN by stroke team, MRI noted with diffuse intravascular disease, LDL stable A1c was 7.9, seen by stroke team.  Recommendation was Eliquis  once he is able to tolerate currently on hold due to acute GI bleed.  Acute GI blood loss related anemia.  Getting packed RBC transfusion on 07/21/2022, on IV PPI, Eliquis on hold.  GI on board.  EGD unrevealing on 07/21/2022.  Defer further work-up to GI.  Continue to monitor CBC closely.  Acute on chronic combined systolic and diastolic heart failure.  EF 35 to 40%.  Continue beta-blocker, Imdur, IV Lasix for diuresis, Kd precludes the use of ACE/ARB.  Continue to monitor.  Acute decompensation due to GI bleed.  CAD history of CABG, stents, PAD .  Continue beta-blocker, and statin for secondary prevention.  Paroxysmal atrial fibrillation.  Mali vas 2 score of greater than 5.  On beta-blocker, Eliquis as tolerated.  AKI on CKD 3B.  Baseline creatinine 1.5 - 1.8, decompensation due to combination of CHF and blood loss anemia.  Transfused and diuresis.  Monitor.  Recent urinary retention.  Had indwelling Foley catheter placed last admission, continued, Flomax added, post discharge outpatient urology follow-up.    Dyslipidemia.  On statin.  Hypokalemia.  Replaced.   DM type II.  On Lantus and sliding scale monitor and adjust.  Lab Results  Component Value Date   HGBA1C 7.9 (H) 06/25/2022   CBG (last 3)  Recent Labs    07/21/22 0735 07/21/22 0742 07/21/22 0933  GLUCAP 139* 138* 145*        Condition - Extremely Guarded  Family Communication  :  None  Code Status :  Full  Consults  : [facility], vascular surgery,  stroke team, ID  PUD Prophylaxis : PPI   Procedures  :     EGD -  Impression:               - Normal esophagus.                           - Z-line regular, 38 cm from the incisors.                           - Acute gastritis. Biopsied.                           - Normal examined duodenum. Recommendation:           - Advance diet as tolerated and full liquid diet.                           - Await pathology results.                           - Observe patient's clinical course.      Disposition Plan  :    Status is: Inpatient   DVT Prophylaxis  :    SCDs Start: 07/18/22 1541   Lab Results  Component Value Date   PLT 99 (L) 07/21/2022    Diet :  Diet Order             DIET SOFT Room service appropriate? Yes; Fluid consistency: Thin  Diet effective now                    Inpatient Medications  Scheduled Meds:  [START ON 07/22/2022] furosemide  40 mg Intravenous BID   furosemide  60 mg Intravenous Once  insulin aspart  0-5 Units Subcutaneous QHS   insulin aspart  0-6 Units Subcutaneous TID WC   isosorbide mononitrate  30 mg Oral Daily   metoprolol succinate  100 mg Oral Q supper   pantoprazole (PROTONIX) IV  40 mg Intravenous Q12H   potassium chloride  40 mEq Oral Once   Continuous Infusions:  cefTRIAXone (ROCEPHIN)  IV 1 g (07/20/22 1446)   vancomycin 1,500 mg (07/20/22 1538)   PRN Meds:.acetaminophen **OR** acetaminophen, LORazepam, prochlorperazine  Antibiotics  :    Anti-infectives (From admission, onward)    Start     Dose/Rate Route Frequency Ordered Stop   07/20/22 1500  vancomycin (VANCOREADY) IVPB 1500 mg/300 mL        1,500 mg 150 mL/hr over 120 Minutes Intravenous Every 48 hours 07/18/22 1851     07/19/22 1400  cefTRIAXone (ROCEPHIN) 1 g in sodium chloride 0.9 % 100 mL IVPB        1 g 200 mL/hr over 30 Minutes Intravenous Every 24 hours 07/18/22 1840     07/18/22 1445  vancomycin (VANCOREADY) IVPB 1500 mg/300 mL        1,500 mg 150  mL/hr over 120 Minutes Intravenous  Once 07/18/22 1430 07/18/22 1843   07/18/22 1430  cefTRIAXone (ROCEPHIN) 2 g in sodium chloride 0.9 % 100 mL IVPB        2 g 200 mL/hr over 30 Minutes Intravenous  Once 07/18/22 1425 07/18/22 1534        Time Spent in minutes  30   Lala Lund M.D on 07/21/2022 at 10:54 AM  To page go to [email or web address]   [facility] -  Office  [phone]  See all Orders from today for further details    Objective:   Vitals:   07/21/22 0921 07/21/22 0930 07/21/22 0945 07/21/22 1009  BP: (!) 122/58 (!) 119/55 135/64 (!) 141/67  Pulse: 71 70 69 72  Resp: (!) 22 (!) 22 20 18   Temp: 97.6 F (36.4 C)  (!) 97.5 F (36.4 C) 97.8 F (36.6 C)  TempSrc:    Oral  SpO2: 99% 98% 95% 99%  Weight:      Height:        Wt Readings from Last 3 Encounters:  07/18/22 85.3 kg  07/14/22 85.3 kg  07/13/22 85.1 kg     Intake/Output Summary (Last 24 hours) at 07/21/2022 1054 Last data filed at 07/21/2022 1038 Gross per 24 hour  Intake 1280 ml  Output 3150 ml  Net -1870 ml     Physical Exam  Awake Alert, No new F.N deficits, Normal affect [place].AT,PERRAL Supple Neck, No JVD,   Symmetrical Chest wall movement, Good air movement bilaterally, positive bibasilar crackles RRR,No Gallops,Rubs or new Murmurs,  +ve B.Sounds, Abd Soft, No tenderness,   Left lower extremity medial tibial incision site with staples and stable, minimal surrounding cellulitis,   Data Review:    CBC Recent Labs  Lab 07/14/22 1301 07/18/22 1252 07/18/22 2036 07/19/22 0500 07/20/22 0329 07/20/22 2207 07/21/22 0442  WBC 12.1* 8.5  --  8.2 7.8  --  6.2  HGB 9.9* 8.0* 7.8* 7.2* 7.6* 7.1* 7.3*  HCT 30.2* 23.9* 23.0* 21.7* 21.8* 20.3* 21.2*  PLT 133* 111*  --  94* 95*  --  99*  MCV 95.9 90.9  --  91.6 88.6  --  89.8  MCH 31.4 30.4  --  30.4 30.9  --  30.9  MCHC 32.8 33.5  --  33.2 34.9  --  34.4  RDW 16.9* 16.3*  --  16.6* 16.2*  --  16.3*  LYMPHSABS 0.2* 0.5*  --   --  0.8   --  1.1  MONOABS 0.2 0.2  --   --  0.3  --  0.3  EOSABS 0.0 0.1  --   --  0.2  --  0.2  BASOSABS 0.0 0.0  --   --  0.0  --  0.0    Electrolytes Recent Labs  Lab 07/14/22 1301 07/15/22 0430 07/18/22 1252 07/18/22 1532 07/18/22 1841 07/19/22 0500 07/20/22 0329 07/21/22 0442  NA 132* 131* 126*  --   --  128* 126* 130*  K 4.4 3.8 4.5  --   --  3.5 3.6 3.4*  CL 97* 97* 96*  --   --  99 101 102  CO2 23 21* 20*  --   --  18* 16* 18*  GLUCOSE 205* 138* 62*  --   --  127* 153* 132*  BUN 35* 40* 40*  --   --  39* 36* 29*  CREATININE 2.26* 2.13* 2.38*  --   --  2.36* 2.30* 2.04*  CALCIUM 7.8* 7.4* 7.6*  --   --  7.2* 7.5* 7.9*  AST 27  --  53*  --   --  25  --  27  ALT 14  --  21  --   --  18  --  22  ALKPHOS 45  --  56  --   --  44  --  49  BILITOT 1.5*  --  1.5*  --   --  0.7  --  0.9  ALBUMIN 2.9*  --  2.7*  --   --  2.4*  --  2.2*  MG  --   --   --   --   --   --   --  1.7  LATICACIDVEN  --   --  1.7 1.0  --   --   --   --   INR  --   --  1.4*  --   --   --   --   --   TSH  --   --   --   --   --   --  2.527  --   BNP  --   --   --   --  809.5*  --   --   --     ------------------------------------------------------------------------------------------------------------------ Recent Labs    07/20/22 0329  CHOL 74  HDL 16*  LDLCALC 35  TRIG 116  CHOLHDL 4.6    Lab Results  Component Value Date   HGBA1C 7.9 (H) 06/25/2022    Recent Labs    07/20/22 0329  TSH 2.527   ------------------------------------------------------------------------------------------------------------------ ID Labs Recent Labs  Lab 07/14/22 1301 07/15/22 0430 07/18/22 1252 07/18/22 1532 07/19/22 0500 07/20/22 0329 07/21/22 0442  WBC 12.1*  --  8.5  --  8.2 7.8 6.2  PLT 133*  --  111*  --  94* 95* 99*  LATICACIDVEN  --   --  1.7 1.0  --   --   --   CREATININE 2.26* 2.13* 2.38*  --  2.36* 2.30* 2.04*    Radiology Reports DG Chest Port 1 View  Result Date: 07/21/2022 CLINICAL  DATA:  81 year old male with history of shortness of breath. Weakness. EXAM: PORTABLE CHEST 1 VIEW COMPARISON:  Chest x-ray 07/20/2022. FINDINGS: There is cephalization of the pulmonary vasculature and slight indistinctness  of the interstitial markings suggestive of mild pulmonary edema. No definite pleural effusions. No pneumothorax. Mild cardiomegaly. Upper mediastinal contours are within normal limits. Atherosclerotic calcifications are noted in the thoracic aorta. Status post median sternotomy for CABG. IMPRESSION: 1. The appearance of the chest is most suggestive of congestive heart failure, as above. Electronically Signed   By: Vinnie Langton M.D.   On: 07/21/2022 06:15   US RENAL  Result Date: 07/20/2022 CLINICAL DATA:  Acute renal insufficiency EXAM: RENAL / URINARY TRACT ULTRASOUND COMPLETE COMPARISON:  None Available. FINDINGS: Right Kidney: Renal measurements: 11.4 x 5.3 x 4.7 cm = volume: 145 mL. Echogenicity within normal limits. No mass or hydronephrosis visualized. Left Kidney: Renal measurements: 12.5 x 4.6 x 4.5 cm = volume: 136 mL. Echogenicity within normal limits. No mass or hydronephrosis visualized. Bladder: Foley catheter balloon seen within a partially decompressed bladder lumen Other: None. IMPRESSION: Normal renal sonogram. Electronically Signed   By: Fidela Salisbury M.D.   On: 07/20/2022 23:25   DG CHEST PORT 1 VIEW  Result Date: 07/20/2022 CLINICAL DATA:  Hyponatremia. EXAM: PORTABLE CHEST 1 VIEW COMPARISON:  Chest x-ray dated July 18, 2022. FINDINGS: Unchanged borderline cardiomegaly status post CABG. New diffuse interstitial thickening. No focal consolidation, pleural effusion, or pneumothorax. No acute osseous abnormality. IMPRESSION: 1. New mild interstitial pulmonary edema. Electronically Signed   By: Titus Dubin M.D.   On: 07/20/2022 11:00   MR ANGIO HEAD WO CONTRAST  Result Date: 07/20/2022 CLINICAL DATA:  81 year old male with altered mental status, TIA,  evidence of punctate white matter ischemia in the left hemisphere on MRI 2 days ago. EXAM: MRA HEAD WITHOUT CONTRAST TECHNIQUE: Angiographic images of the Circle of Willis were acquired using MRA technique without intravenous contrast. COMPARISON:  Neck MRA today reported separately. Brain MRI 07/18/2022. CTA head and neck 05/20/2020. FINDINGS: Anterior circulation: Both ICA siphons are patent but irregular with evidence of calcified atherosclerosis as demonstrated on the prior CTA. Moderate to severe right siphon stenosis suspected in the supraclinoid segment on series 5, image 105. No significant left siphon stenosis. Both carotid termini remain patent. Dominant left and diminutive or absent right ACA A1 segments. No left A1 stenosis. Anterior communicating artery and visible ACA branches are stable and within normal limits. Right MCA M1 segment and bifurcation remain patent. But multifocal Severe right MCA M2 and M3 branch stenoses appear progressed (series 1047, image 16). "Duplicated" left MCA M1 segment again noted. Chronic severe irregularity and stenoses in the region of the left MCA bifurcation appears similar to the 2021 CTA. Left MCA branches appear stable since that time. Posterior circulation: Antegrade flow in the posterior circulation with chronic severe distal right vertebral artery irregularity and stenosis (series 5, image 12). Less pronounced but up to moderate left vertebral V4 segment stenosis. Both distal vertebral arteries, vertebrobasilar junction, and basilar artery remain patent. No significant basilar stenosis. Both PICA origins appear patent.SCA origins are patent. Multifocal mild to moderate left PCA irregularity and stenosis appears stable since the 2021 CTA. But right P1 and P2 segment tandem severe stenoses have progressed since that time (series 1035, image 10). Distal right PCA branches remain patent. Anatomic variants: "Duplicated" left MCA M1 segment. Dominant left and diminutive  or absent right ACA A1. Other: No intracranial mass effect or ventriculomegaly. IMPRESSION: 1. Chronic severe intracranial atherosclerosis, stable since a 2021 CTA (see #2) with the following exceptions: - new tandem Severe tandem stenoses of the Right PCA. - new multifocal Severe Right MCA M2  and M3 branch stenoses. 2. Moderate to severe chronic Left MCA M2 stenoses. Moderate (Left) and severe (Right) distal vertebral artery stenoses. Moderate to severe supraclinoid Right ICA stenosis. Electronically Signed   By: Genevie Ann M.D.   On: 07/20/2022 08:46   MR ANGIO NECK WO CONTRAST  Result Date: 07/20/2022 CLINICAL DATA:  81 year old male with altered mental status, TIA, evidence of punctate white matter ischemia in the left hemisphere on MRI 2 days ago. EXAM: MRA NECK WITHOUT CONTRAST TECHNIQUE: Angiographic images of the neck were acquired using MRA technique without intravenous contrast. Carotid stenosis measurements (when applicable) are obtained utilizing NASCET criteria, using the distal internal carotid diameter as the denominator. COMPARISON:  Brain MRI 07/18/2022.  CTA head and neck 05/20/2020. FINDINGS: Aortic arch: Calcified aortic atherosclerosis. 3 vessel arch configuration demonstrated on the prior CTA. Right carotid system: Patent with antegrade flow signal to the skull base. Irregularity at the right carotid bifurcation corresponds to bulky atherosclerosis demonstrated by CTA in 2021. Hemodynamically significant stenosis at that time (estimated at 75%). No definite flow gap on MRA today. Left carotid system: Antegrade flow to the skull base. Mild irregularity at the left carotid bifurcation corresponding to atherosclerosis demonstrated on prior CTA. No evidence of hemodynamically significant stenosis. Vertebral arteries: Codominant as on the prior CTA. Antegrade flow signal in both vertebral arteries to the skull base. Both vertebral artery origins were better demonstrated by CTA. Mild to moderate  right V2 segment irregularity and stenosis redemonstrated on series 6, image 117. No evidence of left V2 stenosis. Other: None. IMPRESSION: 1. Chronic right carotid bifurcation atherosclerosis with hemodynamically significant proximal Right ICA stenosis (estimated at 75% by CTA in 2021). The right ICA remains patent. 2. Chronic left carotid atherosclerosis without evidence of hemodynamically significant stenosis. 3. Chronic vertebral artery atherosclerosis, more pronounced on the right and up to moderate in the right V2 segment. Both vertebral arteries remain patent to the skull base. Electronically Signed   By: Genevie Ann M.D.   On: 07/20/2022 08:37   VAS [place] LOWER EXTREMITY ARTERIAL DUPLEX  Result Date: 07/19/2022 LOWER EXTREMITY ARTERIAL DUPLEX STUDY Patient Name:  Steve [name].  Date of Exam:   07/18/2022 Medical Rec #: [id number]         Accession #:    [id number] Date of Birth: 1941-09-20          Patient Gender: M Patient Age:   63 years Exam Location:  [facility] Procedure:      VAS [place] LOWER EXTREMITY ARTERIAL DUPLEX Referring Phys: Jamelle Haring --------------------------------------------------------------------------------  Indications: Cellulitis about left calf incision. High Risk Factors: Hypertension, hyperlipidemia, Diabetes.  Current ABI: Limitations: Incision, staples Comparison Study: No prior studies. Performing Technologist: Oliver Hum RVT  Examination Guidelines: A complete evaluation includes B-mode imaging, spectral Doppler, color Doppler, and power Doppler as needed of all accessible portions of each vessel. Bilateral testing is considered an integral part of a complete examination. Limited examinations for reoccurring indications may be performed as noted.   Left Graft #1: +--------------------+--------+--------+--------+--------+                     PSV cm/sStenosisWaveformComments +--------------------+--------+--------+--------+--------+ Inflow                                                +--------------------+--------+--------+--------+--------+ Proximal Anastomosis                                 +--------------------+--------+--------+--------+--------+  Proximal Graft                                       +--------------------+--------+--------+--------+--------+ Mid Graft                                            +--------------------+--------+--------+--------+--------+ Distal Graft        55                               +--------------------+--------+--------+--------+--------+ Distal Anastomosis  192                              +--------------------+--------+--------+--------+--------+ Outflow                                              +--------------------+--------+--------+--------+--------+ There is an area of mixed echogenicity adjacent to the bypass graft in the mid calf.  Summary: Left: Widely patent graft. There is an area of mixed echogenicity adjacent to the bypass graft in the mid calf.  See table(s) above for measurements and observations. Electronically signed by Jamelle Haring on 07/19/2022 at 11:06:40 AM.    Final    MR BRAIN WO CONTRAST  Result Date: 07/18/2022 CLINICAL DATA:  Initial evaluation for acute TIA. EXAM: MRI HEAD WITHOUT CONTRAST TECHNIQUE: Multiplanar, multiecho pulse sequences of the brain and surrounding structures were obtained without intravenous contrast. COMPARISON:  Prior CT from earlier the same day. FINDINGS: Brain: Examination degraded by motion artifact. Diffuse prominence of the CSF containing spaces compatible generalized cerebral atrophy. Mild for age chronic microvascular ischemic disease noted involving the periventricular white matter. Few small remote lacunar infarcts noted about the bilateral basal ganglia and cerebellum. Two distinct punctate foci of restricted diffusion are seen involving the white matter of the left corona radiata (series 5, images 29, 32). Associated  signal loss on ADC map (series 6, image 29). Findings consistent with small acute ischemic infarcts. No associated hemorrhage or mass effect. No other evidence for acute or subacute ischemia. Gray-white matter differentiation otherwise maintained. No areas of chronic cortical infarction. No other acute or chronic intracranial blood products. No mass lesion, midline shift or mass effect. No hydrocephalus or extra-axial fluid collection. Pituitary gland and suprasellar region within normal limits. Vascular: Major intracranial vascular flow voids are maintained. Skull and upper cervical spine: Craniocervical junction within normal limits. Bone marrow signal intensity grossly normal. No scalp soft tissue abnormality. Sinuses/Orbits: Sequelae of prior ocular lens replacement on the right. Susceptibility artifact emanates from the lateral aspect of the left orbit. Mild scattered mucosal thickening noted about the ethmoidal air cells and maxillary sinuses. Small bilateral mastoid effusions noted, of doubtful significance. Negative nasopharynx. Other: None. IMPRESSION: 1. Two distinct punctate foci of restricted diffusion involving the white matter of the left corona radiata, consistent with small acute ischemic infarcts. No associated hemorrhage or mass effect. 2. No other acute intracranial abnormality. 3. Underlying age-related cerebral atrophy with mild chronic small vessel ischemic disease. Electronically Signed   By: Jeannine Boga M.D.   On: 07/18/2022 19:43   CT HEAD WO  CONTRAST  Result Date: 07/18/2022 CLINICAL DATA:  Mental status change, unknown cause EXAM: CT HEAD WITHOUT CONTRAST TECHNIQUE: Contiguous axial images were obtained from the base of the skull through the vertex without intravenous contrast. RADIATION DOSE REDUCTION: This exam was performed according to the departmental dose-optimization program which includes automated exposure control, adjustment of the mA and/or kV according to patient  size and/or use of iterative reconstruction technique. COMPARISON:  05/20/2020 FINDINGS: Brain: No evidence of acute infarction, hemorrhage, hydrocephalus, extra-axial collection or mass lesion/mass effect. Scattered low-density changes within the periventricular and subcortical white matter compatible with chronic microvascular ischemic change. Mild diffuse cerebral volume loss. Vascular: Atherosclerotic calcifications involving the large vessels of the skull base. No unexpected hyperdense vessel. Skull: Normal. Negative for fracture or focal lesion. Sinuses/Orbits: Mild mucosal thickening within the paranasal sinuses. Other: None. IMPRESSION: 1. No acute intracranial abnormality. 2. Chronic microvascular ischemic change and cerebral volume loss. Electronically Signed   By: Davina Poke D.O.   On: 07/18/2022 13:43   DG Chest 2 View  Result Date: 07/18/2022 CLINICAL DATA:  Suspected sepsis. EXAM: CHEST - 2 VIEW COMPARISON:  January 08, 2022 FINDINGS: The patient is status post CABG. Heart size is mildly enlarged but stable. The hila and mediastinum are normal. No pneumothorax. No nodules or masses. No focal infiltrates. IMPRESSION: No cause for sepsis identified. Electronically Signed   By: Dorise Bullion III M.D.   On: 07/18/2022 13:27

## 2022-07-21 NOTE — Progress Notes (Signed)
Transition of Care Department [facility]) following patient for high risk of readmission.   Patient is from [place] and is active with [facility].  Patient admitted for GI bleed, Left leg cellulitis, acute stroke.  Transition of Care Department [facility]) has reviewed patient and we will continue to monitor patient advancement through interdisciplinary progression rounds.

## 2022-07-21 NOTE — Progress Notes (Addendum)
  Progress Note    07/21/2022 7:32 AM * No surgery date entered *  Subjective:  denies any pain in his left leg    Vitals:   07/20/22 2305 07/21/22 0435  BP: 121/60 135/65  Pulse: 78 74  Resp: 19 17  Temp: 98.7 F (37.1 C) 98.5 F (36.9 C)  SpO2: 96% 97%    Physical Exam: Lungs:  nonlabored Incisions:  left calf incision intact with staples Extremities:  improving erythema over left calf incision. Bruising of left 2nd and 3rd toes. L partial 5th ray amp intact with prolene. L foot is warm and well perfused  CBC    Component Value Date/Time   WBC 6.2 07/21/2022 0442   RBC 2.36 (L) 07/21/2022 0442   HGB 7.3 (L) 07/21/2022 0442   HGB 9.9 (L) 07/24/2018 1423   HCT 21.2 (L) 07/21/2022 0442   PLT 99 (L) 07/21/2022 0442   PLT 128 (L) 07/24/2018 1423   MCV 89.8 07/21/2022 0442   MCH 30.9 07/21/2022 0442   MCHC 34.4 07/21/2022 0442   RDW 16.3 (H) 07/21/2022 0442   LYMPHSABS PENDING 07/21/2022 0442   MONOABS PENDING 07/21/2022 0442   EOSABS PENDING 07/21/2022 0442   BASOSABS PENDING 07/21/2022 0442    BMET    Component Value Date/Time   NA 130 (L) 07/21/2022 0442   K 3.4 (L) 07/21/2022 0442   CL 102 07/21/2022 0442   CO2 18 (L) 07/21/2022 0442   GLUCOSE 132 (H) 07/21/2022 0442   BUN 29 (H) 07/21/2022 0442   CREATININE 2.04 (H) 07/21/2022 0442   CALCIUM 7.9 (L) 07/21/2022 0442   GFRNONAA 32 (L) 07/21/2022 0442   GFRAA 46 (L) 05/19/2020 1311    INR    Component Value Date/Time   INR 1.4 (H) 07/18/2022 1252     Intake/Output Summary (Last 24 hours) at 07/21/2022 0732 Last data filed at 07/21/2022 0500 Gross per 24 hour  Intake 1080 ml  Output 2000 ml  Net -920 ml     Assessment/Plan:  81 y.o. male with LLE cellulitis and recent L fem-peroneal bypass on 9/13    -Improving erythema over left calf incision. 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 I agree with the above.  Left leg looks improved overall from a cellulitis standpoint. Recommend lengthy course of antibiotics to help avoid seeding prosthetic graft. Prosthetic graft infection would likely result in limb loss and could cause death. Will follow peripherally.  [name], MD [facility] Phone Number: [phone] 07/21/2022 9:26 AM

## 2022-07-21 NOTE — Progress Notes (Signed)
Carotid artery duplex completed. Refer to "CV Proc" under chart review to view preliminary results.  07/21/2022 4:19 PM Kelby Aline., MHA, RVT, RDCS, RDMS

## 2022-07-21 NOTE — Progress Notes (Signed)
Mobility Specialist Progress Note:   07/21/22 1510  Mobility  Activity Contraindicated/medical hold   Pt receiving blood at time of visit. Per RN request, will hold mobility for today. Will f/u as able.    Ridhima Golberg Mobility Specialist-Acute Rehab Secure Chat only

## 2022-07-21 NOTE — Plan of Care (Signed)
Problem: Fluid Volume: Goal: Hemodynamic stability will improve Outcome: Progressing   Problem: Clinical Measurements: Goal: Diagnostic test results will improve Outcome: Progressing Goal: Signs and symptoms of infection will decrease Outcome: Progressing   Problem: Respiratory: Goal: Ability to maintain adequate ventilation will improve Outcome: Progressing   Problem: Education: Goal: Understanding of CV disease, CV risk reduction, and recovery process will improve Outcome: Progressing Goal: Individualized Educational Video(s) Outcome: Progressing   Problem: Activity: Goal: Ability to return to baseline activity level will improve Outcome: Progressing   Problem: Cardiovascular: Goal: Ability to achieve and maintain adequate cardiovascular perfusion will improve Outcome: Progressing Goal: Vascular access site(s) Level 0-1 will be maintained Outcome: Progressing   Problem: Health Behavior/Discharge Planning: Goal: Ability to safely manage health-related needs after discharge will improve Outcome: Progressing   Problem: Education: Goal: Knowledge of General Education information will improve Description: Including pain rating scale, medication(s)/side effects and non-pharmacologic comfort measures Outcome: Progressing   Problem: Health Behavior/Discharge Planning: Goal: Ability to manage health-related needs will improve Outcome: Progressing   Problem: Clinical Measurements: Goal: Ability to maintain clinical measurements within normal limits will improve Outcome: Progressing Goal: Will remain free from infection Outcome: Progressing Goal: Diagnostic test results will improve Outcome: Progressing Goal: Respiratory complications will improve Outcome: Progressing Goal: Cardiovascular complication will be avoided Outcome: Progressing   Problem: Activity: Goal: Risk for activity intolerance will decrease Outcome: Progressing   Problem: Nutrition: Goal: Adequate  nutrition will be maintained Outcome: Progressing   Problem: Coping: Goal: Level of anxiety will decrease Outcome: Progressing   Problem: Elimination: Goal: Will not experience complications related to bowel motility Outcome: Progressing Goal: Will not experience complications related to urinary retention Outcome: Progressing   Problem: Pain Managment: Goal: General experience of comfort will improve Outcome: Progressing   Problem: Safety: Goal: Ability to remain free from injury will improve Outcome: Progressing   Problem: Skin Integrity: Goal: Risk for impaired skin integrity will decrease Outcome: Progressing   Problem: Clinical Measurements: Goal: Ability to avoid or minimize complications of infection will improve Outcome: Progressing   Problem: Skin Integrity: Goal: Skin integrity will improve Outcome: Progressing   Problem: Education: Goal: Knowledge of disease or condition will improve Outcome: Progressing Goal: Knowledge of secondary prevention will improve (SELECT ALL) Outcome: Progressing Goal: Knowledge of patient specific risk factors will improve (INDIVIDUALIZE FOR PATIENT) Outcome: Progressing Goal: Individualized Educational Video(s) Outcome: Progressing   Problem: Coping: Goal: Will verbalize positive feelings about self Outcome: Progressing Goal: Will identify appropriate support needs Outcome: Progressing   Problem: Health Behavior/Discharge Planning: Goal: Ability to manage health-related needs will improve Outcome: Progressing   Problem: Self-Care: Goal: Ability to participate in self-care as condition permits will improve Outcome: Progressing Goal: Verbalization of feelings and concerns over difficulty with self-care will improve Outcome: Progressing Goal: Ability to communicate needs accurately will improve Outcome: Progressing   Problem: Nutrition: Goal: Risk of aspiration will decrease Outcome: Progressing Goal: Dietary intake  will improve Outcome: Progressing   Problem: Intracerebral Hemorrhage Tissue Perfusion: Goal: Complications of Intracerebral Hemorrhage will be minimized Outcome: Progressing   Problem: Ischemic Stroke/TIA Tissue Perfusion: Goal: Complications of ischemic stroke/TIA will be minimized Outcome: Progressing   Problem: Spontaneous Subarachnoid Hemorrhage Tissue Perfusion: Goal: Complications of Spontaneous Subarachnoid Hemorrhage will be minimized Outcome: Progressing   Problem: Education: Goal: Ability to describe self-care measures that may prevent or decrease complications (Diabetes Survival Skills Education) will improve Outcome: Progressing Goal: Individualized Educational Video(s) Outcome: Progressing   Problem: Coping: Goal: Ability to adjust to condition or  change in health will improve Outcome: Progressing   Problem: Fluid Volume: Goal: Ability to maintain a balanced intake and output will improve Outcome: Progressing

## 2022-07-21 NOTE — Transfer of Care (Signed)
Immediate Anesthesia Transfer of Care Note  Patient: Belvin Gauss.  Procedure(s) Performed: ESOPHAGOGASTRODUODENOSCOPY (EGD) BIOPSY  Patient Location: PACU  Anesthesia Type:MAC  Level of Consciousness: drowsy  Airway & Oxygen Therapy: Patient Spontanous Breathing and Patient connected to nasal cannula oxygen  Post-op Assessment: Report given to RN and Post -op Vital signs reviewed and stable  Post vital signs: Reviewed and stable  Last Vitals:  Vitals Value Taken Time  BP 122/58 07/21/22 0921  Temp    Pulse 66 07/21/22 0925  Resp 25 07/21/22 0925  SpO2 100 % 07/21/22 0925  Vitals shown include unvalidated device data.  Last Pain:  Vitals:   07/21/22 0802  TempSrc: Tympanic  PainSc: 0-No pain         Complications: No notable events documented.

## 2022-07-21 NOTE — NC FL2 (Addendum)
[place] LEVEL OF CARE SCREENING TOOL     IDENTIFICATION  Patient Name: Steve [name]. Birthdate: 09/29/1941 Sex: male Admission Date (Current Location): 07/18/2022  [employer] and [place] Number:  Herbalist and Address:  [facility] [place], [place] [address], [place], [place] [address]      Provider Number: [id number]  Attending Physician Name and Address:  Thurnell Lose, MD  Relative Name and Phone Number:       Current Level of Care: Hospital Recommended Level of Care: [place] Prior Approval Number:    Date Approved/Denied:   PASRR Number: [id number]  Discharge Plan: SNF    Current Diagnoses: Patient Active Problem List   Diagnosis Date Noted   Cellulitis of left lower extremity 07/18/2022   Presbyopia 07/18/2022   Acute blood loss anemia 07/18/2022   Moderate protein malnutrition ([place]) 07/18/2022   GI bleed 07/18/2022   Hypoglycemia 07/18/2022   Chronic combined systolic and diastolic CHF (congestive heart failure) ([place]) 07/08/2022   Prolonged QT interval 07/03/2022   Sepsis due to undetermined organism ([place]) 06/25/2022   Dehydration with hyponatremia 06/25/2022   DKA (diabetic ketoacidoses) 04/16/2019   Atrial fibrillation ([place]) 12/15/2018   CKD (chronic kidney disease), symptom management only, stage 4 (severe) ([place]) 09/08/2018   Sepsis ([place]) 09/07/2018   Diabetic ulcer of toe of left foot associated with type 2 diabetes mellitus ([place])    Cellulitis of left anterior lower leg 07/25/2018   Iron deficiency anemia 07/24/2018   Acute kidney injury superimposed on chronic kidney disease ([place]) 07/24/2018   Hyponatremia 07/24/2018   Hypokalemia 91/91/6606   Metabolic acidosis 00/45/9977   Acute renal failure (ARF) ([place]) 07/24/2018   Normocytic anemia 07/24/2018   Thrombocytopenia ([place]) 07/24/2018   Ischemic cardiomyopathy    CAD (coronary artery disease)    PAF (paroxysmal atrial fibrillation) ([place])     Essential hypertension    Type II diabetes mellitus ([place])    Near syncope 04/01/2015   Dizziness and giddiness 04/01/2015   Fatigue 04/01/2015   Chronic renal disease, stage II 04/01/2015   RBBB 04/01/2015   Angina pectoris ([place]) 04/01/2015   PAT (paroxysmal atrial tachycardia) ([place]) 12/16/2011   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   Diabetes mellitus, type 2 ([place]) 05/31/2011   Hypertension    PVD (peripheral vascular disease) ([place])    Hyperlipidemia     Orientation RESPIRATION BLADDER Height & Weight     Self, Time, Situation, Place  Normal Continent, Indwelling catheter Weight: 188 lb (85.3 kg) Height:  5\' 9"  (175.3 cm)  BEHAVIORAL SYMPTOMS/MOOD NEUROLOGICAL BOWEL NUTRITION STATUS      Continent Diet (See dc summary)  AMBULATORY STATUS COMMUNICATION OF NEEDS Skin   Limited Assist Verbally Surgical wounds (Closed incision on leg, foot, and groin;)                       Personal Care Assistance Level of Assistance  Bathing, Feeding, Dressing Bathing Assistance: Limited assistance Feeding assistance: Limited assistance Dressing Assistance: Limited assistance     Functional Limitations Info             SPECIAL CARE FACTORS FREQUENCY  PT (By licensed PT), OT (By licensed OT)     PT Frequency: 5x/week OT Frequency: 5x/week            Contractures Contractures Info: Not present    Additional Factors Info  Code Status, Allergies, Insulin Sliding Scale Code Status Info: Full  Allergies Info: Januvia (Sitagliptin), Statins   Insulin Sliding Scale Info: See dc summary       Current Medications (07/21/2022):  This is the current hospital active medication list Current Facility-Administered Medications  Medication Dose Route Frequency Provider Last Rate Last Admin   acetaminophen (TYLENOL) tablet 650 mg  650 mg Oral Q6H PRN Reubin Milan, MD   650 mg at 07/20/22 2214   Or   acetaminophen (TYLENOL) suppository 650 mg  650 mg Rectal Q6H PRN Reubin Milan, MD        [START ON 07/23/2022] cefadroxil (DURICEF) capsule 500 mg  500 mg Oral BID Vu, Trung T, MD       ceFAZolin (ANCEF) IVPB 2g/100 mL premix  2 g Intravenous Q12H Vu, [place] T, MD       diphenhydrAMINE (BENADRYL) injection 25 mg  25 mg Intravenous Q6H PRN Thurnell Lose, MD       [START ON 07/23/2022] doxycycline (VIBRA-TABS) tablet 100 mg  100 mg Oral Q12H Vu, Trung T, MD       [START ON 07/22/2022] furosemide (LASIX) injection 40 mg  40 mg Intravenous BID Thurnell Lose, MD       furosemide (LASIX) injection 60 mg  60 mg Intravenous Once Thurnell Lose, MD       insulin aspart (novoLOG) injection 0-5 Units  0-5 Units Subcutaneous QHS Elodia [place]., MD       insulin aspart (novoLOG) injection 0-6 Units  0-6 Units Subcutaneous TID WC Elodia [place]., MD       isosorbide mononitrate (IMDUR) 24 hr tablet 30 mg  30 mg Oral Daily Reubin Milan, MD   30 mg at 07/20/22 1037   LORazepam (ATIVAN) injection 0.5 mg  0.5 mg Intravenous Once PRN Reubin Milan, MD       metoprolol succinate (TOPROL-XL) 24 hr tablet 100 mg  100 mg Oral Q supper Reubin Milan, MD   100 mg at 07/20/22 1745   pantoprazole (PROTONIX) injection 40 mg  40 mg Intravenous Q12H Scheck, Arvella Nigh, PA-C   40 mg at 07/20/22 2214   potassium chloride SA (KLOR-CON M) CR tablet 40 mEq  40 mEq Oral Once Thurnell Lose, MD       prochlorperazine (COMPAZINE) injection 5 mg  5 mg Intravenous Q4H PRN Reubin Milan, MD       tamsulosin [facility]) capsule 0.4 mg  0.4 mg Oral Daily Thurnell Lose, MD       vancomycin (VANCOREADY) IVPB 1500 mg/300 mL  1,500 mg Intravenous Q48H Vu, Trung T, MD 150 mL/hr at 07/20/22 1538 1,500 mg at 07/20/22 1538     Discharge Medications: Please see discharge summary for a list of discharge medications.  Relevant Imaging Results:  Relevant Lab Results:   Additional Information SSN: [place]  [place], LCSW

## 2022-07-21 NOTE — Anesthesia Preprocedure Evaluation (Signed)
Anesthesia Evaluation    Reviewed: Allergy & Precautions, Patient's Chart, lab work & pertinent test results  Airway Mallampati: II  TM Distance: >3 FB Neck ROM: Full    Dental  (+) Edentulous Upper, Edentulous Lower   Pulmonary former smoker,    Pulmonary exam normal        Cardiovascular hypertension, Pt. on medications and Pt. on home beta blockers + CAD, + CABG, + Peripheral Vascular Disease and +CHF  Normal cardiovascular exam+ dysrhythmias   ECHO: Left ventricular ejection fraction, by estimation, is 35 to 40%. The left ventricle has moderately decreased function. The left ventricle demonstrates global hypokinesis. The left ventricular internal cavity size was mildly dilated. Left ventricular diastolic parameters are consistent with Grade II diastolic dysfunction (pseudonormalization). Elevated left atrial pressure. Right ventricular systolic function is normal. The right ventricular size is normal. There is normal pulmonary artery systolic pressure. The estimated right ventricular systolic pressure is 24.0 mmHg. Left atrial size was moderately dilated. Secondary ventricular functional mitral regurgitation with regurgitant volume 49 mL. Systolic blunting of the right sided pulmonary vein systolic flow. The mitral valve is normal in structure. Moderate mitral valve regurgitation. No evidence of mitral stenosis. The aortic valve is tricuspid. Aortic valve regurgitation is not visualized. No aortic stenosis is present.   Neuro/Psych negative neurological ROS     GI/Hepatic negative GI ROS, (+)     substance abuse  ,   Endo/Other  diabetes, Insulin Dependent, Oral Hypoglycemic Agents  Renal/GU CRFRenal disease     Musculoskeletal negative musculoskeletal ROS (+)   Abdominal   Peds  Hematology  (+) Blood dyscrasia, anemia , Thrombocytopenia    Anesthesia Other Findings  Anemia heme positive stools   Reproductive/Obstetrics                             Anesthesia Physical Anesthesia Plan  ASA: 3  Anesthesia Plan: MAC   Post-op Pain Management:    Induction: Intravenous  PONV Risk Score and Plan: 1 and Propofol infusion and Treatment may vary due to age or medical condition  Airway Management Planned: Nasal Cannula  Additional Equipment:   Intra-op Plan:   Post-operative Plan:   Informed Consent: I have reviewed the patients History and Physical, chart, labs and discussed the procedure including the risks, benefits and alternatives for the proposed anesthesia with the patient or authorized representative who has indicated his/her understanding and acceptance.     Dental advisory given  Plan Discussed with: CRNA  Anesthesia Plan Comments:         Anesthesia Quick Evaluation

## 2022-07-22 ENCOUNTER — Inpatient Hospital Stay (HOSPITAL_COMMUNITY): Payer: Medicare Other

## 2022-07-22 DIAGNOSIS — L03116 Cellulitis of left lower limb: Secondary | ICD-10-CM | POA: Diagnosis not present

## 2022-07-22 LAB — C-REACTIVE PROTEIN: CRP: 2.5 mg/dL — ABNORMAL HIGH (ref <1.0)

## 2022-07-22 LAB — BASIC METABOLIC PANEL
Anion gap: 15 (ref 5–15)
BUN: 25 mg/dL — ABNORMAL HIGH (ref 8–23)
CO2: 19 mmol/L — ABNORMAL LOW (ref 22–32)
Calcium: 8.8 mg/dL — ABNORMAL LOW (ref 8.9–10.3)
Chloride: 98 mmol/L (ref 98–111)
Creatinine, Ser: 1.8 mg/dL — ABNORMAL HIGH (ref 0.61–1.24)
GFR, Estimated: 37 mL/min — ABNORMAL LOW (ref >60)
Glucose, Bld: 136 mg/dL — ABNORMAL HIGH (ref 70–99)
Potassium: 3.8 mmol/L (ref 3.5–5.1)
Sodium: 132 mmol/L — ABNORMAL LOW (ref 135–145)

## 2022-07-22 LAB — CBC WITH DIFFERENTIAL/PLATELET
Abs Immature Granulocytes: 0.04 10*3/uL (ref 0.00–0.07)
Basophils Absolute: 0 10*3/uL (ref 0.0–0.1)
Basophils Relative: 1 %
Eosinophils Absolute: 0.2 10*3/uL (ref 0.0–0.5)
Eosinophils Relative: 3 %
HCT: 25.9 % — ABNORMAL LOW (ref 39.0–52.0)
Hemoglobin: 8.9 g/dL — ABNORMAL LOW (ref 13.0–17.0)
Immature Granulocytes: 1 %
Lymphocytes Relative: 20 %
Lymphs Abs: 1.2 10*3/uL (ref 0.7–4.0)
MCH: 30.8 pg (ref 26.0–34.0)
MCHC: 34.4 g/dL (ref 30.0–36.0)
MCV: 89.6 fL (ref 80.0–100.0)
Monocytes Absolute: 0.4 10*3/uL (ref 0.1–1.0)
Monocytes Relative: 6 %
Neutro Abs: 4 10*3/uL (ref 1.7–7.7)
Neutrophils Relative %: 69 %
Platelets: 110 10*3/uL — ABNORMAL LOW (ref 150–400)
RBC: 2.89 MIL/uL — ABNORMAL LOW (ref 4.22–5.81)
RDW: 15.5 % (ref 11.5–15.5)
Smear Review: DECREASED
WBC: 5.8 10*3/uL (ref 4.0–10.5)
nRBC: 0 % (ref 0.0–0.2)

## 2022-07-22 LAB — TYPE AND SCREEN
ABO/RH(D): O POS
Antibody Screen: NEGATIVE
Unit division: 0

## 2022-07-22 LAB — BPAM RBC
Blood Product Expiration Date: 202310302359
ISSUE DATE / TIME: 202309271145
Unit Type and Rh: 5100

## 2022-07-22 LAB — GLUCOSE, CAPILLARY
Glucose-Capillary: 148 mg/dL — ABNORMAL HIGH (ref 70–99)
Glucose-Capillary: 140 mg/dL — ABNORMAL HIGH (ref 70–99)
Glucose-Capillary: 108 mg/dL — ABNORMAL HIGH (ref 70–99)
Glucose-Capillary: 150 mg/dL — ABNORMAL HIGH (ref 70–99)

## 2022-07-22 LAB — MAGNESIUM: Magnesium: 1.9 mg/dL (ref 1.7–2.4)

## 2022-07-22 LAB — BRAIN NATRIURETIC PEPTIDE: B Natriuretic Peptide: 1916.7 pg/mL — ABNORMAL HIGH (ref 0.0–100.0)

## 2022-07-22 MED ORDER — APIXABAN 2.5 MG PO TABS
2.5000 mg | ORAL_TABLET | Freq: Two times a day (BID) | ORAL | Status: DC
  Administered 2022-07-22 – 2022-07-26 (×9): 2.5 mg via ORAL
  Filled 2022-07-22 (×9): qty 1

## 2022-07-22 MED ORDER — FUROSEMIDE 10 MG/ML IJ SOLN: 40.0000 mg | Freq: Two times a day (BID) | INTRAMUSCULAR | Status: DC

## 2022-07-22 MED ORDER — POTASSIUM CHLORIDE CRYS ER 20 MEQ PO TBCR
40.0000 meq | EXTENDED_RELEASE_TABLET | Freq: Once | ORAL | Status: AC
  Administered 2022-07-22: 40 meq via ORAL
  Filled 2022-07-22: qty 2

## 2022-07-22 MED ORDER — SPIRONOLACTONE 25 MG PO TABS
50.0000 mg | ORAL_TABLET | Freq: Once | ORAL | Status: AC
  Administered 2022-07-22: 50 mg via ORAL
  Filled 2022-07-22: qty 2

## 2022-07-22 MED ORDER — FUROSEMIDE 10 MG/ML IJ SOLN
60.0000 mg | Freq: Once | INTRAMUSCULAR | Status: AC
  Administered 2022-07-22: 60 mg via INTRAVENOUS
  Filled 2022-07-22: qty 6

## 2022-07-22 NOTE — Progress Notes (Signed)
PROGRESS NOTE                                                                                                                                                                                                             Patient Demographics:    Steve [name], is a 81 y.o. male, DOB - [birth year], YPP:[id number]  Outpatient Primary MD for the patient is System, Provider Not In    LOS - 4  Admit date - 07/18/2022    Chief Complaint  Patient presents with   Code Sepsis       Brief Narrative (HPI from H&P)   81 y.o. male with PMH significant for DM2, HTN, HLD, PAD with intermittent claudication, CAD/CABG/stents, ischemic cardiomyopathy, paroxysmal A-fib on Eliquis, right bundle branch block. Recently hospitalized 9/1 to 9/19 for sepsis secondary to left foot cellulitis/osteomyelitis 07/07/22, underwent left common femoral to peroneal bypass with PTFE by vascular surgeon Dr. Stanford Breed, 07/09/22, underwent fifth toe amputation by podiatry 07/13/22.   He was discharged back to independent living at [facility] on 07/13/22 with an indwelling Foley catheter for urinary retention.  He had received IV antibiotics during the stay and was discharged on oral doxycycline. Post discharge, patient continued to have pain at the surgical site.  He presented back from independent living to ER on 07/18/2022 with fever, shortness of breath, was diagnosed with left leg surgical site cellulitis, possible new CVA, hypoglycemia and was admitted to the hospital.   Subjective:   Patient in bed, appears comfortable, denies any headache, no fever, no chest pain or pressure, does have mild orthopnea and shortness of breath , no abdominal pain. No new focal weakness.    Assessment  & Plan :    Left leg cellulitis with recent left common femoral, peroneal artery bypass with 6 mm PTFE subcutaneous tunnel, recent left foot fifth ray amputation - HE has been seen by  vascular surgery, currently on IV empiric antibiotics with improvement in cellulitis.  Vascular surgery wants prolonged IV antibiotic course to avoid any extension of infection to the vascular graft, appreciate ID input.  Per ID switch to Doxy 100 mg bid and cefadroxil 500 bid on 07/24/22 - plan total abx 4 more weeks from 9/24 - 10/22   Acute stroke with MRI showing 2 distinct punctate foci of restricted diffusion involving white matter of  the left coronary radiata  - SEEN by stroke team, MRI noted with diffuse intravascular disease, LDL stable A1c was 7.9, seen by stroke team.  Recommendation was Eliquis once he is able to tolerate currently on hold due to acute GI bleed.  Will check with GI on 07/22/2022 to see when it is appropriate to resume Eliquis.  Acute GI blood loss related anemia.  Getting packed RBC transfusion on 07/21/2022, on IV PPI, Eliquis as above.  GI on board.  EGD unrevealing on 07/21/2022.  Defer further work-up to GI.  CBC currently stable continue to monitor.  Acute on chronic combined systolic and diastolic heart failure.  EF 35 to 40%.  Continue beta-blocker, Imdur, IV Lasix for diuresis, underlying CKD precludes the use of ACE/ARB.  Continue to monitor.  Acute decompensation due to GI bleed.  CAD history of CABG, stents, PAD .  Continue beta-blocker, and statin for secondary prevention.  Paroxysmal atrial fibrillation.  Mali vas 2 score of greater than 5.  On beta-blocker, Eliquis as tolerated.  AKI on CKD 3B.  Baseline creatinine 1.5 - 1.8, decompensation due to combination of CHF and blood loss anemia.  Transfused and diuresis.  Monitor.  Recent urinary retention.  Had indwelling Foley catheter placed last admission, continued, Flomax added on 07/21/2022, post discharge outpatient urology follow-up.    Dyslipidemia.  On statin.  Hypokalemia.  Replaced.   DM type II.  On Lantus and sliding scale monitor and adjust.  Lab Results  Component Value Date   HGBA1C 7.9 (H)  06/25/2022   CBG (last 3)  Recent Labs    07/21/22 1539 07/21/22 2058 07/22/22 0812  GLUCAP 135* 123* 148*        Condition - Extremely Guarded  Family Communication  :  None  Code Status :  Full  Consults  : [facility], vascular surgery, stroke team, ID  PUD Prophylaxis : PPI   Procedures  :     EGD -  Impression:               - Normal esophagus.                           - Z-line regular, 38 cm from the incisors.                           - Acute gastritis. Biopsied.                           - Normal examined duodenum. Recommendation:           - Advance diet as tolerated and full liquid diet.                           - Await pathology results.                           - Observe patient's clinical course.      Disposition Plan  :    Status is: Inpatient   DVT Prophylaxis  :    SCDs Start: 07/18/22 1541   Lab Results  Component Value Date   PLT 110 (L) 07/22/2022    Diet :  Diet Order             DIET SOFT Room service appropriate? Yes;  Fluid consistency: Thin  Diet effective now                    Inpatient Medications  Scheduled Meds:  [START ON 07/23/2022] cefadroxil  500 mg Oral BID   [START ON 07/23/2022] doxycycline  100 mg Oral Q12H   furosemide  60 mg Intravenous Once   insulin aspart  0-5 Units Subcutaneous QHS   insulin aspart  0-6 Units Subcutaneous TID WC   isosorbide mononitrate  30 mg Oral Daily   metoprolol succinate  100 mg Oral Q supper   pantoprazole (PROTONIX) IV  40 mg Intravenous Q12H   spironolactone  50 mg Oral Once   tamsulosin  0.4 mg Oral Daily   Continuous Infusions:   ceFAZolin (ANCEF) IV 2 g (07/21/22 2214)   vancomycin 1,500 mg (07/20/22 1538)   PRN Meds:.acetaminophen **OR** acetaminophen, diphenhydrAMINE, LORazepam, prochlorperazine  Antibiotics  :    Anti-infectives (From admission, onward)    Start     Dose/Rate Route Frequency Ordered Stop   07/23/22 1000  doxycycline (VIBRA-TABS) tablet 100  mg        100 mg Oral Every 12 hours 07/21/22 1207 08/15/22 2359   07/23/22 1000  cefadroxil (DURICEF) capsule 500 mg        500 mg Oral 2 times daily 07/21/22 1207 08/15/22 2359   07/21/22 1300  ceFAZolin (ANCEF) IVPB 2g/100 mL premix        2 g 200 mL/hr over 30 Minutes Intravenous Every 12 hours 07/21/22 1207 07/23/22 0959   07/20/22 1500  vancomycin (VANCOREADY) IVPB 1500 mg/300 mL        1,500 mg 150 mL/hr over 120 Minutes Intravenous Every 48 hours 07/18/22 1851 07/22/22 2359   07/19/22 1400  cefTRIAXone (ROCEPHIN) 1 g in sodium chloride 0.9 % 100 mL IVPB  Status:  Discontinued        1 g 200 mL/hr over 30 Minutes Intravenous Every 24 hours 07/18/22 1840 07/21/22 1207   07/18/22 1445  vancomycin (VANCOREADY) IVPB 1500 mg/300 mL        1,500 mg 150 mL/hr over 120 Minutes Intravenous  Once 07/18/22 1430 07/18/22 1843   07/18/22 1430  cefTRIAXone (ROCEPHIN) 2 g in sodium chloride 0.9 % 100 mL IVPB        2 g 200 mL/hr over 30 Minutes Intravenous  Once 07/18/22 1425 07/18/22 1534        Time Spent in minutes  30   Lala Lund M.D on 07/22/2022 at 8:25 AM  To page go to [email or web address]   [facility] -  Office  [phone]  See all Orders from today for further details    Objective:   Vitals:   07/21/22 2310 07/22/22 0310 07/22/22 0500 07/22/22 0811  BP: (!) 146/68 (!) 152/70    Pulse: 68 78    Resp: (!) 21 20    Temp: 97.7 F (36.5 C) 97.9 F (36.6 C)  98.5 F (36.9 C)  TempSrc: Oral Oral  Oral  SpO2:      Weight:   86 kg   Height:        Wt Readings from Last 3 Encounters:  07/22/22 86 kg  07/14/22 85.3 kg  07/13/22 85.1 kg     Intake/Output Summary (Last 24 hours) at 07/22/2022 0825 Last data filed at 07/22/2022 0318 Gross per 24 hour  Intake 1160 ml  Output 1650 ml  Net -490 ml     Physical Exam  Awake Alert,  No new F.N deficits, Normal affect [place].AT,PERRAL Supple Neck, No JVD,   Symmetrical Chest wall movement, Good air movement  bilaterally, +ve fine rales RRR,No Gallops, Rubs or new Murmurs,  +ve B.Sounds, Abd Soft, No tenderness,    Left lower extremity mid calf & lateral foot/ 5th ray amputation incision sites with staples and stable,  resolved surrounding cellulitis,   Data Review:   Recent Labs  Lab 07/18/22 1252 07/18/22 2036 07/19/22 0500 07/20/22 0329 07/20/22 2207 07/21/22 0442 07/22/22 0505  WBC 8.5  --  8.2 7.8  --  6.2 5.8  HGB 8.0*   < > 7.2* 7.6* 7.1* 7.3* 8.9*  HCT 23.9*   < > 21.7* 21.8* 20.3* 21.2* 25.9*  PLT 111*  --  94* 95*  --  99* 110*  MCV 90.9  --  91.6 88.6  --  89.8 89.6  MCH 30.4  --  30.4 30.9  --  30.9 30.8  MCHC 33.5  --  33.2 34.9  --  34.4 34.4  RDW 16.3*  --  16.6* 16.2*  --  16.3* 15.5  LYMPHSABS 0.5*  --   --  0.8  --  1.1 1.2  MONOABS 0.2  --   --  0.3  --  0.3 0.4  EOSABS 0.1  --   --  0.2  --  0.2 0.2  BASOSABS 0.0  --   --  0.0  --  0.0 0.0   < > = values in this interval not displayed.    Recent Labs  Lab 07/18/22 1252 07/18/22 1532 07/18/22 1841 07/19/22 0500 07/20/22 0329 07/21/22 0442 07/21/22 1053 07/22/22 0505  NA 126*  --   --  128* 126* 130*  --  132*  K 4.5  --   --  3.5 3.6 3.4*  --  3.8  CL 96*  --   --  99 101 102  --  98  CO2 20*  --   --  18* 16* 18*  --  19*  GLUCOSE 62*  --   --  127* 153* 132*  --  136*  BUN 40*  --   --  39* 36* 29*  --  25*  CREATININE 2.38*  --   --  2.36* 2.30* 2.04*  --  1.80*  CALCIUM 7.6*  --   --  7.2* 7.5* 7.9*  --  8.8*  AST 53*  --   --  25  --  27  --   --   ALT 21  --   --  18  --  22  --   --   ALKPHOS 56  --   --  44  --  49  --   --   BILITOT 1.5*  --   --  0.7  --  0.9  --   --   ALBUMIN 2.7*  --   --  2.4*  --  2.2*  --   --   MG  --   --   --   --   --  1.7  --  1.9  PHOS  --   --   --   --   --  3.7  --   --   CRP  --   --   --   --   --   --  3.8* 2.5*  LATICACIDVEN 1.7 1.0  --   --   --   --   --   --  INR 1.4*  --   --   --   --   --   --   --   TSH  --   --   --   --  2.527  --   --    --   BNP  --   --  809.5*  --   --   --   --  1,916.7*    Radiology Reports DG Chest Port 1 View  Result Date: 07/22/2022 CLINICAL DATA:  81 year old male with history of shortness of breath. EXAM: PORTABLE CHEST 1 VIEW COMPARISON:  Chest x-ray 07/21/2022. FINDINGS: There is cephalization of the pulmonary vasculature and slight indistinctness of the interstitial markings suggestive of mild pulmonary edema. No definite pleural effusions. No pneumothorax. Heart size is mildly enlarged. Upper mediastinal contours are within normal limits. Atherosclerotic calcifications are noted in the thoracic aorta. Status post median sternotomy for CABG. IMPRESSION: 1. The appearance of the chest is most suggestive of congestive heart failure, as above. Electronically Signed   By: Vinnie Langton M.D.   On: 07/22/2022 06:37   VAS US CAROTID  Result Date: 07/21/2022 Carotid Arterial Duplex Study Patient Name:  Daivd Fredericksen.  Date of Exam:   07/21/2022 Medical Rec #: [id number]         Accession #:    [id number] Date of Birth: 1941/04/25          Patient Gender: M Patient Age:   60 years Exam Location:  [facility] Procedure:      VAS US CAROTID Referring Phys: Elwin Sleight DE [place] TORRE --------------------------------------------------------------------------------  Indications:       CVA. Risk Factors:      Hypertension, hyperlipidemia, Diabetes, past history of                    smoking, coronary artery disease. Comparison Study:  No prior study Performing Technologist: Maudry Mayhew MHA, RDMS, RVT, RDCS  Examination Guidelines: A complete evaluation includes B-mode imaging, spectral Doppler, color Doppler, and power Doppler as needed of all accessible portions of each vessel. Bilateral testing is considered an integral part of a complete examination. Limited examinations for reoccurring indications may be performed as noted.  Right Carotid Findings:  +----------+--------+--------+--------+------------------------------+---------+           PSV cm/sEDV cm/sStenosisPlaque Description            Comments  +----------+--------+--------+--------+------------------------------+---------+ CCA Prox  62      10                                                      +----------+--------+--------+--------+------------------------------+---------+ CCA Distal63      8               heterogenous and calcific     Shadowing +----------+--------+--------+--------+------------------------------+---------+ ICA Prox  151     27      1-39%   heterogenous, irregular and   Shadowing                                   calcific                                +----------+--------+--------+--------+------------------------------+---------+  ICA Mid   131     18                                                      +----------+--------+--------+--------+------------------------------+---------+ ICA Distal85      17                                                      +----------+--------+--------+--------+------------------------------+---------+ ECA       144                     heterogenous and irregular              +----------+--------+--------+--------+------------------------------+---------+ +----------+--------+-------+----------------+-------------------+           PSV cm/sEDV cmsDescribe        Arm Pressure (mmHG) +----------+--------+-------+----------------+-------------------+ [id number]            Multiphasic, WNL                    +----------+--------+-------+----------------+-------------------+ +---------+--------+--+--------+-+---------+ VertebralPSV cm/s46EDV cm/s7Antegrade +---------+--------+--+--------+-+---------+  Left Carotid Findings: +----------+--------+--------+--------+------------------------------+---------+           PSV cm/sEDV cm/sStenosisPlaque Description            Comments   +----------+--------+--------+--------+------------------------------+---------+ CCA Prox  64      12                                                      +----------+--------+--------+--------+------------------------------+---------+ CCA Distal76      14              heterogenous and irregular              +----------+--------+--------+--------+------------------------------+---------+ ICA Prox  78      18              heterogenous, irregular and   Shadowing                                   calcific                                +----------+--------+--------+--------+------------------------------+---------+ ICA Distal83      20                                                      +----------+--------+--------+--------+------------------------------+---------+ ECA       108                                                             +----------+--------+--------+--------+------------------------------+---------+ +----------+--------+--------+----------------+-------------------+  PSV cm/sEDV cm/sDescribe        Arm Pressure (mmHG) +----------+--------+--------+----------------+-------------------+ [id number]             Multiphasic, WNL                    +----------+--------+--------+----------------+-------------------+ +---------+--------+--+--------+--+---------+ VertebralPSV cm/s39EDV cm/s12Antegrade +---------+--------+--+--------+--+---------+   Summary: Right Carotid: Velocities in the right ICA are consistent with a 1-39% stenosis. Left Carotid: Velocities in the left ICA are consistent with a 1-39% stenosis. Vertebrals:  Bilateral vertebral arteries demonstrate antegrade flow. Subclavians: Normal flow hemodynamics were seen in bilateral subclavian              arteries. *See table(s) above for measurements and observations.  Electronically signed by Harold Barban MD on 07/21/2022 at 9:07:46 PM.    Final    DG Chest Port 1  View  Result Date: 07/21/2022 CLINICAL DATA:  81 year old male with history of shortness of breath. Weakness. EXAM: PORTABLE CHEST 1 VIEW COMPARISON:  Chest x-ray 07/20/2022. FINDINGS: There is cephalization of the pulmonary vasculature and slight indistinctness of the interstitial markings suggestive of mild pulmonary edema. No definite pleural effusions. No pneumothorax. Mild cardiomegaly. Upper mediastinal contours are within normal limits. Atherosclerotic calcifications are noted in the thoracic aorta. Status post median sternotomy for CABG. IMPRESSION: 1. The appearance of the chest is most suggestive of congestive heart failure, as above. Electronically Signed   By: Vinnie Langton M.D.   On: 07/21/2022 06:15   US RENAL  Result Date: 07/20/2022 CLINICAL DATA:  Acute renal insufficiency EXAM: RENAL / URINARY TRACT ULTRASOUND COMPLETE COMPARISON:  None Available. FINDINGS: Right Kidney: Renal measurements: 11.4 x 5.3 x 4.7 cm = volume: 145 mL. Echogenicity within normal limits. No mass or hydronephrosis visualized. Left Kidney: Renal measurements: 12.5 x 4.6 x 4.5 cm = volume: 136 mL. Echogenicity within normal limits. No mass or hydronephrosis visualized. Bladder: Foley catheter balloon seen within a partially decompressed bladder lumen Other: None. IMPRESSION: Normal renal sonogram. Electronically Signed   By: Fidela Salisbury M.D.   On: 07/20/2022 23:25   DG CHEST PORT 1 VIEW  Result Date: 07/20/2022 CLINICAL DATA:  Hyponatremia. EXAM: PORTABLE CHEST 1 VIEW COMPARISON:  Chest x-ray dated July 18, 2022. FINDINGS: Unchanged borderline cardiomegaly status post CABG. New diffuse interstitial thickening. No focal consolidation, pleural effusion, or pneumothorax. No acute osseous abnormality. IMPRESSION: 1. New mild interstitial pulmonary edema. Electronically Signed   By: Titus Dubin M.D.   On: 07/20/2022 11:00   MR ANGIO HEAD WO CONTRAST  Result Date: 07/20/2022 CLINICAL DATA:  81 year old  male with altered mental status, TIA, evidence of punctate white matter ischemia in the left hemisphere on MRI 2 days ago. EXAM: MRA HEAD WITHOUT CONTRAST TECHNIQUE: Angiographic images of the Circle of Willis were acquired using MRA technique without intravenous contrast. COMPARISON:  Neck MRA today reported separately. Brain MRI 07/18/2022. CTA head and neck 05/20/2020. FINDINGS: Anterior circulation: Both ICA siphons are patent but irregular with evidence of calcified atherosclerosis as demonstrated on the prior CTA. Moderate to severe right siphon stenosis suspected in the supraclinoid segment on series 5, image 105. No significant left siphon stenosis. Both carotid termini remain patent. Dominant left and diminutive or absent right ACA A1 segments. No left A1 stenosis. Anterior communicating artery and visible ACA branches are stable and within normal limits. Right MCA M1 segment and bifurcation remain patent. But multifocal Severe right MCA M2 and M3 branch stenoses appear progressed (series 1047, image 16). "Duplicated" left MCA M1  segment again noted. Chronic severe irregularity and stenoses in the region of the left MCA bifurcation appears similar to the 2021 CTA. Left MCA branches appear stable since that time. Posterior circulation: Antegrade flow in the posterior circulation with chronic severe distal right vertebral artery irregularity and stenosis (series 5, image 12). Less pronounced but up to moderate left vertebral V4 segment stenosis. Both distal vertebral arteries, vertebrobasilar junction, and basilar artery remain patent. No significant basilar stenosis. Both PICA origins appear patent.SCA origins are patent. Multifocal mild to moderate left PCA irregularity and stenosis appears stable since the 2021 CTA. But right P1 and P2 segment tandem severe stenoses have progressed since that time (series 1035, image 10). Distal right PCA branches remain patent. Anatomic variants: "Duplicated" left MCA M1  segment. Dominant left and diminutive or absent right ACA A1. Other: No intracranial mass effect or ventriculomegaly. IMPRESSION: 1. Chronic severe intracranial atherosclerosis, stable since a 2021 CTA (see #2) with the following exceptions: - new tandem Severe tandem stenoses of the Right PCA. - new multifocal Severe Right MCA M2 and M3 branch stenoses. 2. Moderate to severe chronic Left MCA M2 stenoses. Moderate (Left) and severe (Right) distal vertebral artery stenoses. Moderate to severe supraclinoid Right ICA stenosis. Electronically Signed   By: Genevie Ann M.D.   On: 07/20/2022 08:46   MR ANGIO NECK WO CONTRAST  Result Date: 07/20/2022 CLINICAL DATA:  81 year old male with altered mental status, TIA, evidence of punctate white matter ischemia in the left hemisphere on MRI 2 days ago. EXAM: MRA NECK WITHOUT CONTRAST TECHNIQUE: Angiographic images of the neck were acquired using MRA technique without intravenous contrast. Carotid stenosis measurements (when applicable) are obtained utilizing NASCET criteria, using the distal internal carotid diameter as the denominator. COMPARISON:  Brain MRI 07/18/2022.  CTA head and neck 05/20/2020. FINDINGS: Aortic arch: Calcified aortic atherosclerosis. 3 vessel arch configuration demonstrated on the prior CTA. Right carotid system: Patent with antegrade flow signal to the skull base. Irregularity at the right carotid bifurcation corresponds to bulky atherosclerosis demonstrated by CTA in 2021. Hemodynamically significant stenosis at that time (estimated at 75%). No definite flow gap on MRA today. Left carotid system: Antegrade flow to the skull base. Mild irregularity at the left carotid bifurcation corresponding to atherosclerosis demonstrated on prior CTA. No evidence of hemodynamically significant stenosis. Vertebral arteries: Codominant as on the prior CTA. Antegrade flow signal in both vertebral arteries to the skull base. Both vertebral artery origins were better  demonstrated by CTA. Mild to moderate right V2 segment irregularity and stenosis redemonstrated on series 6, image 117. No evidence of left V2 stenosis. Other: None. IMPRESSION: 1. Chronic right carotid bifurcation atherosclerosis with hemodynamically significant proximal Right ICA stenosis (estimated at 75% by CTA in 2021). The right ICA remains patent. 2. Chronic left carotid atherosclerosis without evidence of hemodynamically significant stenosis. 3. Chronic vertebral artery atherosclerosis, more pronounced on the right and up to moderate in the right V2 segment. Both vertebral arteries remain patent to the skull base. Electronically Signed   By: Genevie Ann M.D.   On: 07/20/2022 08:37   VAS [place] LOWER EXTREMITY ARTERIAL DUPLEX  Result Date: 07/19/2022 LOWER EXTREMITY ARTERIAL DUPLEX STUDY Patient Name:  Melquiades Kovar.  Date of Exam:   07/18/2022 Medical Rec #: [id number]         Accession #:    [id number] Date of Birth: [birth year]          Patient Gender: M Patient Age:   59 years  Exam Location:  [facility] Procedure:      VAS [place] LOWER EXTREMITY ARTERIAL DUPLEX Referring Phys: Jamelle Haring --------------------------------------------------------------------------------  Indications: Cellulitis about left calf incision. High Risk Factors: Hypertension, hyperlipidemia, Diabetes.  Current ABI: Limitations: Incision, staples Comparison Study: No prior studies. Performing Technologist: Oliver Hum RVT  Examination Guidelines: A complete evaluation includes B-mode imaging, spectral Doppler, color Doppler, and power Doppler as needed of all accessible portions of each vessel. Bilateral testing is considered an integral part of a complete examination. Limited examinations for reoccurring indications may be performed as noted.   Left Graft #1: +--------------------+--------+--------+--------+--------+                     PSV cm/sStenosisWaveformComments  +--------------------+--------+--------+--------+--------+ Inflow                                               +--------------------+--------+--------+--------+--------+ Proximal Anastomosis                                 +--------------------+--------+--------+--------+--------+ Proximal Graft                                       +--------------------+--------+--------+--------+--------+ Mid Graft                                            +--------------------+--------+--------+--------+--------+ Distal Graft        55                               +--------------------+--------+--------+--------+--------+ Distal Anastomosis  192                              +--------------------+--------+--------+--------+--------+ Outflow                                              +--------------------+--------+--------+--------+--------+ There is an area of mixed echogenicity adjacent to the bypass graft in the mid calf.  Summary: Left: Widely patent graft. There is an area of mixed echogenicity adjacent to the bypass graft in the mid calf.  See table(s) above for measurements and observations. Electronically signed by Jamelle Haring on 07/19/2022 at 11:06:40 AM.    Final    MR BRAIN WO CONTRAST  Result Date: 07/18/2022 CLINICAL DATA:  Initial evaluation for acute TIA. EXAM: MRI HEAD WITHOUT CONTRAST TECHNIQUE: Multiplanar, multiecho pulse sequences of the brain and surrounding structures were obtained without intravenous contrast. COMPARISON:  Prior CT from earlier the same day. FINDINGS: Brain: Examination degraded by motion artifact. Diffuse prominence of the CSF containing spaces compatible generalized cerebral atrophy. Mild for age chronic microvascular ischemic disease noted involving the periventricular white matter. Few small remote lacunar infarcts noted about the bilateral basal ganglia and cerebellum. Two distinct punctate foci of restricted diffusion are seen  involving the white matter of the left corona radiata (series 5, images 29, 32). Associated signal loss  on ADC map (series 6, image 29). Findings consistent with small acute ischemic infarcts. No associated hemorrhage or mass effect. No other evidence for acute or subacute ischemia. Gray-white matter differentiation otherwise maintained. No areas of chronic cortical infarction. No other acute or chronic intracranial blood products. No mass lesion, midline shift or mass effect. No hydrocephalus or extra-axial fluid collection. Pituitary gland and suprasellar region within normal limits. Vascular: Major intracranial vascular flow voids are maintained. Skull and upper cervical spine: Craniocervical junction within normal limits. Bone marrow signal intensity grossly normal. No scalp soft tissue abnormality. Sinuses/Orbits: Sequelae of prior ocular lens replacement on the right. Susceptibility artifact emanates from the lateral aspect of the left orbit. Mild scattered mucosal thickening noted about the ethmoidal air cells and maxillary sinuses. Small bilateral mastoid effusions noted, of doubtful significance. Negative nasopharynx. Other: None. IMPRESSION: 1. Two distinct punctate foci of restricted diffusion involving the white matter of the left corona radiata, consistent with small acute ischemic infarcts. No associated hemorrhage or mass effect. 2. No other acute intracranial abnormality. 3. Underlying age-related cerebral atrophy with mild chronic small vessel ischemic disease. Electronically Signed   By: Jeannine Boga M.D.   On: 07/18/2022 19:43   CT HEAD WO CONTRAST  Result Date: 07/18/2022 CLINICAL DATA:  Mental status change, unknown cause EXAM: CT HEAD WITHOUT CONTRAST TECHNIQUE: Contiguous axial images were obtained from the base of the skull through the vertex without intravenous contrast. RADIATION DOSE REDUCTION: This exam was performed according to the departmental dose-optimization program  which includes automated exposure control, adjustment of the mA and/or kV according to patient size and/or use of iterative reconstruction technique. COMPARISON:  05/20/2020 FINDINGS: Brain: No evidence of acute infarction, hemorrhage, hydrocephalus, extra-axial collection or mass lesion/mass effect. Scattered low-density changes within the periventricular and subcortical white matter compatible with chronic microvascular ischemic change. Mild diffuse cerebral volume loss. Vascular: Atherosclerotic calcifications involving the large vessels of the skull base. No unexpected hyperdense vessel. Skull: Normal. Negative for fracture or focal lesion. Sinuses/Orbits: Mild mucosal thickening within the paranasal sinuses. Other: None. IMPRESSION: 1. No acute intracranial abnormality. 2. Chronic microvascular ischemic change and cerebral volume loss. Electronically Signed   By: Davina Poke D.O.   On: 07/18/2022 13:43   DG Chest 2 View  Result Date: 07/18/2022 CLINICAL DATA:  Suspected sepsis. EXAM: CHEST - 2 VIEW COMPARISON:  January 08, 2022 FINDINGS: The patient is status post CABG. Heart size is mildly enlarged but stable. The hila and mediastinum are normal. No pneumothorax. No nodules or masses. No focal infiltrates. IMPRESSION: No cause for sepsis identified. Electronically Signed   By: Dorise Bullion III M.D.   On: 07/18/2022 13:27

## 2022-07-22 NOTE — TOC Initial Note (Addendum)
Transition of Care (TOC) - Initial/Assessment Note    Patient Details  Name: Steve [name]. MRN: [id number] Date of Birth: May 05, 1941  Transition of Care [facility]) CM/SW Contact:    Benard Halsted, LCSW Phone Number: 07/22/2022, 11:28 AM  Clinical Narrative:                 11:25am-CSW received consult for possible SNF placement at time of discharge. CSW spoke with patient. Patient reported that he resides at [facility]. Patient expressed understanding of PT recommendation and is agreeable to SNF placement at time of discharge. He requested CSW call his son, Steve [name], to discuss options. They are arranging for patient to move into the ALF side after rehab but a bed is not available yet. CSW discussed insurance authorization process and will provide Medicare SNF ratings list. CSW left voicemail for patient's son.   11:42 AM-Son returned CSW's call. CSW emailing SNF list to him at [email or web address]  3pm-Patient's son toured and has selected [facility]. They have a bed available for patient tomorrow if ready.    Skilled Nursing Rehab Facilities-   [email or web address]   Ratings out of 5 stars (the highest)   Name Address  Phone # [place] Inspection Overall  [facility] [address], [place] 5 5 2 4   [facility]  [address] [place], [place] [phone] 4 2 5 5   [facility] [place], [place] 1 1 1 1   [place], [place] 2 2 4 4   [facility] [address], [place] 1 1 2 1   [place] [address] [place] [phone] 3 2 4 4   [facility] [address] [place] 5 1 3 3   [facility] [address] [place] 5 2 3 4   [address] ([place]) [place], [place] [phone] 4 1 2 1   [facility] [phone] [address], [place]  [phone] 4 1 1 1   [facility] [address] [facility] [phone] 3 1 2 1   [facility] ([place]) [place]. [place], [place] [phone] 4 1 1 1   [facility]ust[facility] Flock [address] [place] [phone] 4 2 4 4           [facility] [address] [phone]      [facility] [place] 4 1 3 2   [facility] [place] [address], [place] 3 1 5 4   [place], [place] [phone] 1 1 1 1   [facility] [address], [place] [phone] 2 2 3 3           [place] (no [facility]) [place] [facility], [place] [phone] 5 5 5 5   [place] (No Humana) [address], [place] 4 1 4 3   [place] (No UHC) [place], [place] 5 5 5 5   [facility] [address] [facility] [phone] 2 3 5 5   [place] [address], [place] 1 1 2 1   [place] [address] 3 1 1 1   [place], [place] 5 2 4 5   [facility]  [address] [place] 2 2 1 1   [facility] [address] [place] 3 2 1 1   [facility] [address] [facility]  [phone] 2 2 2 2           [place], [place] [phone] 1 1 1 1   [place] [address], [place]  [phone] 2 4 2 2   [facility] [address], [place] [phone] 4 2 3 3   [place] [address] [place] 2 1 1 1   [place] (No Humana) [address], [place] [phone] 2 2 3 3   [facility] [address] [place] [phone] 2 1 1 1           [facility], [place] 5 4 5 5   [facility])  [address], [place] 2 1 2 1   [facility]) [place] [address], [place] 3 1 4 3   [place] [address], [place] 3 3 4 4   [address] [place], [place] 2 3 1 1   [facility]) [address] [place] [phone] 2 1 4 3      Expected Discharge Plan: [place] Barriers to Discharge: SNF Pending bed offer, Continued Medical Work up   Patient Goals and CMS Choice Patient states their goals for this hospitalization and ongoing recovery are:: Rehab and then move into ALF CMS Medicare.gov Compare Post Acute Care list provided to:: Patient Choice offered to / list presented to : Patient  Expected Discharge Plan and Services Expected Discharge Plan: [place] In-house Referral: Clinical Social Work   Post Acute Care Choice: [place] Living arrangements for the past 2 months: [place]                                      Prior Living Arrangements/Services Living arrangements for the past 2 months: [place] Lives with:: Facility Resident Patient language and need for interpreter reviewed:: Yes Do you feel safe going back to the place where you live?: Yes      Need for Family Participation in Patient Care: Yes (Comment) Care giver support system in place?: Yes (comment)   Criminal Activity/Legal Involvement Pertinent to Current Situation/Hospitalization: No - Comment as needed  Activities of Daily Living Home Assistive Devices/Equipment: Environmental consultant (specify type), Eyeglasses, CBG Meter, Built-in shower seat ADL Screening (condition at time of admission) Patient's cognitive ability adequate to safely complete daily activities?: Yes Is the patient deaf or have difficulty hearing?: No Does the patient have difficulty seeing, even when wearing glasses/contacts?: No Does the patient have difficulty concentrating, remembering, or making decisions?:  No Patient able to express need for assistance with ADLs?: Yes Does the patient have difficulty dressing or bathing?: No Independently performs ADLs?: Yes (appropriate for developmental age) Does the patient have difficulty walking or climbing stairs?: Yes Weakness of Legs: Both Weakness of Arms/Hands: None  Permission Sought/Granted Permission sought to share information with : Facility Sport and exercise psychologist, Family Supports Permission granted to share information with : Yes, Verbal Permission Granted  Share Information with NAME: Steve [name]  Permission granted to share info w AGENCY: SNFs  Permission granted to share info w Relationship: Son  Permission granted to share info w Contact Information: [phone]  Emotional Assessment Appearance:: Appears stated age Attitude/Demeanor/Rapport: Engaged, Gracious Affect (typically observed): Accepting, Appropriate, [place] Orientation: : Oriented to Self, Oriented to Place, Oriented to  Time, Oriented to Situation  Alcohol / Substance Use: Not Applicable Psych Involvement: No (comment)  Admission diagnosis:  Transient alteration of awareness [R40.4] [place]ena [K92.1] Acute cystitis with hematuria [N30.01] Cellulitis of left lower extremity [L03.116] AKI (acute kidney injury) ([place]) [N17.9] Sepsis, due to unspecified organism, unspecified whether acute organ dysfunction present [facility]) [A41.9] Patient Active Problem List   Diagnosis Date Noted   Cellulitis of left lower extremity 07/18/2022   Presbyopia 07/18/2022   Acute blood loss anemia 07/18/2022   Moderate protein malnutrition ([place]) 07/18/2022   GI bleed 07/18/2022   Hypoglycemia 07/18/2022   Chronic combined systolic and diastolic CHF (congestive heart failure) ([place]) 07/08/2022   Prolonged QT interval 07/03/2022   Sepsis due to undetermined organism ([place]) 06/25/2022   Dehydration with hyponatremia 06/25/2022   DKA (diabetic ketoacidoses) 04/16/2019   Atrial fibrillation ([place])  12/15/2018   CKD (chronic kidney disease), symptom management only, stage 4 (severe) ([place]) 09/08/2018   Sepsis ([place]) 09/07/2018   Diabetic ulcer of toe of left foot associated with type 2 diabetes mellitus ([place])    Cellulitis of left anterior lower leg 07/25/2018   Iron deficiency anemia 07/24/2018   Acute kidney injury superimposed on chronic kidney disease ([place]) 07/24/2018   Hyponatremia 07/24/2018   Hypokalemia 99/77/4142   Metabolic acidosis 39/53/2023   Acute renal failure (ARF) ([place]) 07/24/2018   Normocytic anemia 07/24/2018   Thrombocytopenia ([place]) 07/24/2018   Ischemic cardiomyopathy    CAD (coronary artery disease)    PAF (paroxysmal atrial fibrillation) ([place])    Essential hypertension    Type II diabetes mellitus ([place])    Near syncope 04/01/2015   Dizziness and giddiness 04/01/2015   Fatigue 04/01/2015   Chronic renal disease, stage II 04/01/2015   RBBB 04/01/2015   Angina pectoris ([place]) 04/01/2015   PAT (paroxysmal atrial tachycardia) ([place]) 12/16/2011   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 [place] ([place]) [place] [address] Phone: [phone] Fax: [phone]     Social Determinants of Health (SDOH) Interventions    Readmission Risk Interventions    07/22/2022   11:13 AM 07/13/2022   10:22 AM  Readmission Risk Prevention Plan  Transportation Screening Complete Complete  PCP or Specialist Appt within 3-5 Days  Complete  HRI or [place]  Complete  Social Work Consult for [place] Planning/Counseling  Complete  Palliative Care Screening  Not Applicable  Medication Review Press photographer) Complete Complete  PCP or Specialist appointment within 3-5 days of discharge Complete   HRI or Home Care Consult Complete    SW Recovery Care/Counseling Consult Complete   Palliative Care Screening Not Applicable   Skilled Nursing Facility Complete

## 2022-07-22 NOTE — Progress Notes (Signed)
PT Cancellation Note  Patient Details Name: Steve [name]. MRN: [id number] DOB: [birth year]   Cancelled Treatment:    Reason Eval/Treat Not Completed: Fatigue/lethargy limiting ability to participate - pt reports he had a bath and busy morning, requests PT hold. Will check back as schedule allows.   Stacie Glaze, PT DPT Acute Rehabilitation Services Pager [phone]  Office [phone]   Louis Matte 07/22/2022, 2:24 PM

## 2022-07-22 NOTE — Progress Notes (Signed)
Mobility Specialist Criteria Algorithm Info.   07/22/22 1440  Mobility  Activity Refused mobility   Will f/u as tim permits.  Steve [name], [place], [place]  WYSOR:[phone] Office: [phone]

## 2022-07-22 NOTE — Care Management Important Message (Signed)
Important Message  Patient Details  Name: [employer] MRN: [id number] Date of Birth: Mar 12, 1941   Medicare Important Message Given:  Yes     Nikki Glanzer 07/22/2022, 3:11 PM

## 2022-07-23 ENCOUNTER — Encounter (HOSPITAL_COMMUNITY): Payer: Self-pay | Admitting: Gastroenterology

## 2022-07-23 DIAGNOSIS — L03116 Cellulitis of left lower limb: Secondary | ICD-10-CM | POA: Diagnosis not present

## 2022-07-23 LAB — CULTURE, BLOOD (ROUTINE X 2): Culture: NO GROWTH

## 2022-07-23 LAB — CBC WITH DIFFERENTIAL/PLATELET
Abs Immature Granulocytes: 0 10*3/uL (ref 0.00–0.07)
Basophils Absolute: 0.2 10*3/uL — ABNORMAL HIGH (ref 0.0–0.1)
Basophils Relative: 3 %
Eosinophils Absolute: 0 10*3/uL (ref 0.0–0.5)
Eosinophils Relative: 0 %
HCT: 29.8 % — ABNORMAL LOW (ref 39.0–52.0)
Hemoglobin: 10.2 g/dL — ABNORMAL LOW (ref 13.0–17.0)
Lymphocytes Relative: 16 %
Lymphs Abs: 0.9 10*3/uL (ref 0.7–4.0)
MCH: 30.7 pg (ref 26.0–34.0)
MCHC: 34.2 g/dL (ref 30.0–36.0)
MCV: 89.8 fL (ref 80.0–100.0)
Monocytes Absolute: 0.3 10*3/uL (ref 0.1–1.0)
Monocytes Relative: 5 %
Neutro Abs: 4.2 10*3/uL (ref 1.7–7.7)
Neutrophils Relative %: 76 %
Platelets: 147 10*3/uL — ABNORMAL LOW (ref 150–400)
RBC: 3.32 MIL/uL — ABNORMAL LOW (ref 4.22–5.81)
RDW: 15.3 % (ref 11.5–15.5)
WBC: 5.5 10*3/uL (ref 4.0–10.5)
nRBC: 0 % (ref 0.0–0.2)
nRBC: 0 /100 WBC

## 2022-07-23 LAB — GLUCOSE, CAPILLARY
Glucose-Capillary: 144 mg/dL — ABNORMAL HIGH (ref 70–99)
Glucose-Capillary: 132 mg/dL — ABNORMAL HIGH (ref 70–99)
Glucose-Capillary: 128 mg/dL — ABNORMAL HIGH (ref 70–99)
Glucose-Capillary: 165 mg/dL — ABNORMAL HIGH (ref 70–99)

## 2022-07-23 LAB — BASIC METABOLIC PANEL
Anion gap: 12 (ref 5–15)
BUN: 21 mg/dL (ref 8–23)
CO2: 23 mmol/L (ref 22–32)
Calcium: 8.7 mg/dL — ABNORMAL LOW (ref 8.9–10.3)
Chloride: 98 mmol/L (ref 98–111)
Creatinine, Ser: 1.71 mg/dL — ABNORMAL HIGH (ref 0.61–1.24)
GFR, Estimated: 39 mL/min — ABNORMAL LOW (ref >60)
Glucose, Bld: 134 mg/dL — ABNORMAL HIGH (ref 70–99)
Potassium: 3.6 mmol/L (ref 3.5–5.1)
Sodium: 133 mmol/L — ABNORMAL LOW (ref 135–145)

## 2022-07-23 LAB — SURGICAL PATHOLOGY

## 2022-07-23 LAB — MAGNESIUM: Magnesium: 1.4 mg/dL — ABNORMAL LOW (ref 1.7–2.4)

## 2022-07-23 LAB — C-REACTIVE PROTEIN: CRP: 1.6 mg/dL — ABNORMAL HIGH (ref <1.0)

## 2022-07-23 LAB — BRAIN NATRIURETIC PEPTIDE: B Natriuretic Peptide: 1239.2 pg/mL — ABNORMAL HIGH (ref 0.0–100.0)

## 2022-07-23 MED ORDER — EZETIMIBE 10 MG PO TABS
10.0000 mg | ORAL_TABLET | Freq: Every day | ORAL | Status: DC
  Administered 2022-07-23 – 2022-07-26 (×4): 10 mg via ORAL
  Filled 2022-07-23 (×4): qty 1

## 2022-07-23 MED ORDER — PRAVASTATIN SODIUM 40 MG PO TABS
40.0000 mg | ORAL_TABLET | Freq: Every day | ORAL | Status: DC
  Administered 2022-07-23 – 2022-07-25 (×3): 40 mg via ORAL
  Filled 2022-07-23 (×3): qty 1

## 2022-07-23 MED ORDER — CHLORHEXIDINE GLUCONATE CLOTH 2 % EX PADS
6.0000 | MEDICATED_PAD | Freq: Every day | CUTANEOUS | Status: DC
  Administered 2022-07-24 – 2022-07-26 (×3): 6 via TOPICAL

## 2022-07-23 MED ORDER — FUROSEMIDE 10 MG/ML IJ SOLN
40.0000 mg | Freq: Two times a day (BID) | INTRAMUSCULAR | Status: AC
  Administered 2022-07-23 (×2): 40 mg via INTRAVENOUS
  Filled 2022-07-23 (×2): qty 4

## 2022-07-23 MED ORDER — PANTOPRAZOLE SODIUM 40 MG PO TBEC
40.0000 mg | DELAYED_RELEASE_TABLET | Freq: Two times a day (BID) | ORAL | Status: DC
  Administered 2022-07-23 – 2022-07-26 (×7): 40 mg via ORAL
  Filled 2022-07-23 (×7): qty 1

## 2022-07-23 MED ORDER — MAGNESIUM SULFATE 4 GM/100ML IV SOLN
4.0000 g | Freq: Once | INTRAVENOUS | Status: AC
  Administered 2022-07-23: 4 g via INTRAVENOUS
  Filled 2022-07-23: qty 100

## 2022-07-23 MED ORDER — POTASSIUM CHLORIDE CRYS ER 20 MEQ PO TBCR
40.0000 meq | EXTENDED_RELEASE_TABLET | Freq: Once | ORAL | Status: AC
  Administered 2022-07-23: 40 meq via ORAL
  Filled 2022-07-23: qty 2

## 2022-07-23 MED ORDER — SPIRONOLACTONE 25 MG PO TABS
25.0000 mg | ORAL_TABLET | Freq: Once | ORAL | Status: AC
  Administered 2022-07-23: 25 mg via ORAL
  Filled 2022-07-23: qty 1

## 2022-07-23 MED ORDER — MAGNESIUM SULFATE IN D5W 1-5 GM/100ML-% IV SOLN
1.0000 g | Freq: Once | INTRAVENOUS | Status: AC
  Administered 2022-07-23: 1 g via INTRAVENOUS
  Filled 2022-07-23: qty 100

## 2022-07-23 MED ORDER — DOXAZOSIN MESYLATE 2 MG PO TABS
2.0000 mg | ORAL_TABLET | Freq: Every day | ORAL | Status: DC
  Administered 2022-07-23 – 2022-07-26 (×4): 2 mg via ORAL
  Filled 2022-07-23 (×4): qty 1

## 2022-07-23 NOTE — Progress Notes (Signed)
Physical Therapy Treatment Patient Details Name: Steve [name]. MRN: [id number] DOB: 08-13-1941 Today's Date: 07/23/2022   History of Present Illness Pt is an 81 y.o. male recently admitted from [facility] on 06/25/22 with AKI, hyponatremia, sepsis, L foot nonhealing wound. S/p aortogram, LLE angiogram 9/8. 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 WBAT RLE in Post-op shoe. Pt returned home on 9/19. Pt [facility] ED on 9/24 after son found pt in his chair with AMS. MRI revealved "Two distinct punctate foci of restricted diffusion involving the  white matter of the left corona radiata, consistent with small acute  ischemic infarcts. No associated hemorrhage or mass effect." and pt also found to have UTI and BGL in the 50s.  PMH includes CAD s/p CABG, HTN, HLD, DM, PAD, afib.    PT Comments    Pt demonstrating min activity progression today, tolerating hallway distance gait with use of RW and L post-op shoe. Pt requiring light physical assist, especially during bed mobility, and pt agrees SNF is appropriate d/c plan at this time. VSS during mobility, PT to continue to follow acutely.      Recommendations for follow up therapy are one component of a multi-disciplinary discharge planning process, led by the attending physician.  Recommendations may be updated based on patient status, additional functional criteria and insurance authorization.  Follow Up Recommendations  Skilled nursing-short term rehab (<3 hours/day) Can patient physically be transported by private vehicle: Yes   Assistance Recommended at Discharge Intermittent Supervision/Assistance  Patient can return home with the following Assistance with cooking/housework;Assist for transportation;Help with stairs or ramp for entrance;A little help with walking and/or transfers;A little help with bathing/dressing/bathroom   Equipment Recommendations  None recommended by PT     Recommendations for Other Services       Precautions / Restrictions Precautions Precautions: Fall Required Braces or Orthoses: Other Brace Other Brace: post op shoe - per podiatry via secure chat with Marcille Blanco last admission, pt okay to WB through heel for gait Restrictions Weight Bearing Restrictions: No LLE Weight Bearing: Weight bearing as tolerated Other Position/Activity Restrictions: in post op shoe, heel WB during gait     Mobility  Bed Mobility Overal bed mobility: Needs Assistance Bed Mobility: Supine to Sit, Sit to Supine     Supine to sit: Min assist Sit to supine: Supervision   General bed mobility comments: assist for trunk elevation and LE translation to EOB    Transfers Overall transfer level: Needs assistance Equipment used: Rolling walker (2 wheels) Transfers: Sit to/from Stand Sit to Stand: Min guard           General transfer comment: close guard for safety    Ambulation/Gait Ambulation/Gait assistance: Min guard Gait Distance (Feet): 100 Feet Assistive device: Rolling walker (2 wheels) Gait Pattern/deviations: Step-through pattern, Decreased stride length, Antalgic, Trunk flexed Gait velocity: decr     General Gait Details: cues for heel WB LLE, upright posture, and placement in RW   Stairs             Wheelchair Mobility    Modified Rankin (Stroke Patients Only)       Balance Overall balance assessment: History of Falls, Needs assistance Sitting-balance support: No upper extremity supported, Feet supported Sitting balance-Leahy Scale: Fair     Standing balance support: Bilateral upper extremity supported, Reliant on assistive device for balance Standing balance-Leahy Scale: Poor Standing balance comment: reliant on external support  Cognition Arousal/Alertness: Awake/alert Behavior During Therapy: WFL for tasks assessed/performed Overall Cognitive Status: Within Functional  Limits for tasks assessed                                          Exercises General Exercises - Lower Extremity Heel Slides: AROM, Both, 15 reps, Seated Straight Leg Raises: AROM, Both, 10 reps, Supine    General Comments        Pertinent Vitals/Pain Pain Assessment Pain Assessment: Faces Faces Pain Scale: Hurts a little bit Pain Location: LLE Pain Descriptors / Indicators: Sore Pain Intervention(s): Limited activity within patient's tolerance, Monitored during session, Repositioned    Home Living                          Prior Function            PT Goals (current goals can now be found in the care plan section) Acute Rehab PT Goals PT Goal Formulation: With patient Time For Goal Achievement: 07/30/22 Potential to Achieve Goals: Good Progress towards PT goals: Progressing toward goals    Frequency    Min 3X/week      PT Plan Current plan remains appropriate    Co-evaluation              AM-PAC PT "6 Clicks" Mobility   Outcome Measure  Help needed turning from your back to your side while in a flat bed without using bedrails?: A Little Help needed moving from lying on your back to sitting on the side of a flat bed without using bedrails?: A Little Help needed moving to and from a bed to a chair (including a wheelchair)?: A Little Help needed standing up from a chair using your arms (e.g., wheelchair or bedside chair)?: A Little Help needed to walk in hospital room?: A Little Help needed climbing 3-5 steps with a railing? : A Lot 6 Click Score: 17    End of Session Equipment Utilized During Treatment: Other (comment) (post op shoe on L foot) Activity Tolerance: Patient tolerated treatment well Patient left: with call bell/phone within reach;in bed;with bed alarm set;Other (comment) Nurse Communication: Mobility status PT Visit Diagnosis: Difficulty in walking, not elsewhere classified (R26.2)     Time: 6270-3500 PT  Time Calculation (min) (ACUTE ONLY): 19 min  Charges:  $Therapeutic Activity: 8-22 mins                     Stacie Glaze, PT DPT Acute Rehabilitation Services Pager [phone]  Office [phone]    Roxine Caddy E Ruffin Pyo 07/23/2022, 3:13 PM

## 2022-07-23 NOTE — Progress Notes (Signed)
PROGRESS NOTE                                                                                                                                                                                                             Patient Demographics:    Steve [name], is a 81 y.o. male, DOB - 30-Mar-1941, VBT:[id number]  Outpatient Primary MD for the patient is System, Provider Not In    LOS - 5  Admit date - 07/18/2022    Chief Complaint  Patient presents with   Code Sepsis       Brief Narrative (HPI from H&P)   81 y.o. male with PMH significant for DM2, HTN, HLD, PAD with intermittent claudication, CAD/CABG/stents, ischemic cardiomyopathy, paroxysmal A-fib on Eliquis, right bundle branch block. Recently hospitalized 9/1 to 9/19 for sepsis secondary to left foot cellulitis/osteomyelitis 07/07/22, underwent left common femoral to peroneal bypass with PTFE by vascular surgeon Dr. Stanford Breed, 07/09/22, underwent fifth toe amputation by podiatry 07/13/22.   He was discharged back to independent living at [facility] on 07/13/22 with an indwelling Foley catheter for urinary retention.  He had received IV antibiotics during the stay and was discharged on oral doxycycline. Post discharge, patient continued to have pain at the surgical site.  He presented back from independent living to ER on 07/18/2022 with fever, shortness of breath, was diagnosed with left leg surgical site cellulitis, possible new CVA, hypoglycemia and was admitted to the hospital.   Subjective:   Patient in bed, appears comfortable, denies any headache, no fever, no chest pain or pressure, no shortness of breath , no abdominal pain. No new focal weakness.   Assessment  & Plan :    Left leg cellulitis with recent left common femoral, peroneal artery bypass with 6 mm PTFE subcutaneous tunnel, recent left foot fifth ray amputation - HE has been seen by vascular surgery, currently  on IV empiric antibiotics with improvement in cellulitis.  Vascular surgery wants prolonged IV antibiotic course to avoid any extension of infection to the vascular graft, appreciate ID input.  Per ID switch to Doxy 100 mg bid and cefadroxil 500 bid on 07/24/22 - plan total abx 4 more weeks from 9/24 - 10/22   Acute stroke with MRI showing 2 distinct punctate foci of restricted diffusion involving white matter of the left coronary radiata  -  SEEN by stroke team, MRI noted with diffuse intravascular disease, LDL stable A1c was 7.9, seen by stroke team.  Case discussed with GI okay to resume Eliquis.  Acute GI blood loss related anemia.  Getting packed RBC transfusion on 07/21/2022, on IV PPI, Eliquis as above.  GI on board.  EGD unrevealing on 07/21/2022.  Defer further work-up to GI.  CBC currently stable continue to monitor.  Acute on chronic combined systolic and diastolic heart failure.  EF 35 to 40%.  Continue beta-blocker, Imdur, IV Lasix for diuresis, underlying CKD precludes the use of ACE/ARB.  Continue to monitor.  Acute decompensation due to GI bleed.  CAD history of CABG, stents, PAD .  Continue beta-blocker, and statin for secondary prevention.  Paroxysmal atrial fibrillation.  Mali vas 2 score of greater than 5.  On beta-blocker, Eliquis as tolerated.  AKI on CKD 3B.  Baseline creatinine 1.5 - 1.8, decompensation due to combination of CHF and blood loss anemia.  Transfused and diuresis.  Monitor.  Recent urinary retention.  Had indwelling Foley catheter placed last admission, continued, Flomax added on 07/21/2022, post discharge outpatient urology follow-up.    Dyslipidemia.  On statin.  Hypokalemia.  Replaced.   DM type II.  On Lantus and sliding scale monitor and adjust.  Lab Results  Component Value Date   HGBA1C 7.9 (H) 06/25/2022   CBG (last 3)  Recent Labs    07/22/22 1539 07/22/22 2121 07/23/22 0812  GLUCAP 108* 150* 144*        Condition - Extremely  Guarded  Family Communication  :  None  Code Status :  Full  Consults  : [facility], vascular surgery, stroke team, ID  PUD Prophylaxis : PPI   Procedures  :     EGD -  Impression:               - Normal esophagus.                           - Z-line regular, 38 cm from the incisors.                           - Acute gastritis. Biopsied.                           - Normal examined duodenum. Recommendation:           - Advance diet as tolerated and full liquid diet.                           - Await pathology results.                           - Observe patient's clinical course.      Disposition Plan  :    Status is: Inpatient   DVT Prophylaxis  :    apixaban (ELIQUIS) tablet 2.5 mg Start: 07/22/22 1000 SCDs Start: 07/18/22 1541 apixaban (ELIQUIS) tablet 2.5 mg   Lab Results  Component Value Date   PLT 147 (L) 07/23/2022    Diet :  Diet Order             DIET SOFT Room service appropriate? Yes; Fluid consistency: Thin  Diet effective now  Inpatient Medications  Scheduled Meds:  apixaban  2.5 mg Oral BID   cefadroxil  500 mg Oral BID   Chlorhexidine Gluconate Cloth  6 each Topical Daily   doxazosin  2 mg Oral Daily   doxycycline  100 mg Oral Q12H   ezetimibe  10 mg Oral Daily   furosemide  40 mg Intravenous BID   insulin aspart  0-5 Units Subcutaneous QHS   insulin aspart  0-6 Units Subcutaneous TID WC   isosorbide mononitrate  30 mg Oral Daily   metoprolol succinate  100 mg Oral Q supper   pantoprazole  40 mg Oral BID   pravastatin  40 mg Oral q1800   spironolactone  25 mg Oral Once   tamsulosin  0.4 mg Oral Daily   Continuous Infusions:  magnesium sulfate bolus IVPB     PRN Meds:.acetaminophen **OR** acetaminophen, diphenhydrAMINE, LORazepam, prochlorperazine  Antibiotics  :    Anti-infectives (From admission, onward)    Start     Dose/Rate Route Frequency Ordered Stop   07/23/22 1000  doxycycline (VIBRA-TABS) tablet  100 mg        100 mg Oral Every 12 hours 07/21/22 1207 08/15/22 2359   07/23/22 1000  cefadroxil (DURICEF) capsule 500 mg        500 mg Oral 2 times daily 07/21/22 1207 08/15/22 2359   07/21/22 1300  ceFAZolin (ANCEF) IVPB 2g/100 mL premix        2 g 200 mL/hr over 30 Minutes Intravenous Every 12 hours 07/21/22 1207 07/23/22 0024   07/20/22 1500  vancomycin (VANCOREADY) IVPB 1500 mg/300 mL        1,500 mg 150 mL/hr over 120 Minutes Intravenous Every 48 hours 07/18/22 1851 07/22/22 1918   07/19/22 1400  cefTRIAXone (ROCEPHIN) 1 g in sodium chloride 0.9 % 100 mL IVPB  Status:  Discontinued        1 g 200 mL/hr over 30 Minutes Intravenous Every 24 hours 07/18/22 1840 07/21/22 1207   07/18/22 1445  vancomycin (VANCOREADY) IVPB 1500 mg/300 mL        1,500 mg 150 mL/hr over 120 Minutes Intravenous  Once 07/18/22 1430 07/18/22 1843   07/18/22 1430  cefTRIAXone (ROCEPHIN) 2 g in sodium chloride 0.9 % 100 mL IVPB        2 g 200 mL/hr over 30 Minutes Intravenous  Once 07/18/22 1425 07/18/22 1534        Time Spent in minutes  30   Lala Lund M.D on 07/23/2022 at 10:54 AM  To page go to [email or web address]   [facility] -  Office  [phone]  See all Orders from today for further details    Objective:   Vitals:   07/22/22 2356 07/23/22 0346 07/23/22 0500 07/23/22 0800  BP: (!) 148/72 (!) 156/75  (!) 157/65  Pulse: 79 75  72  Resp: 19 13  14   Temp:  97.8 F (36.6 C)  (!) 97 F (36.1 C)  TempSrc:  Oral  Axillary  SpO2: 99% 94%    Weight:   79.6 kg   Height:        Wt Readings from Last 3 Encounters:  07/23/22 79.6 kg  07/14/22 85.3 kg  07/13/22 85.1 kg     Intake/Output Summary (Last 24 hours) at 07/23/2022 1054 Last data filed at 07/23/2022 0300 Gross per 24 hour  Intake --  Output 2000 ml  Net -2000 ml     Physical Exam  Awake Alert, No new F.N deficits,  Normal affect [place].AT,PERRAL Supple Neck, No JVD,   Symmetrical Chest wall movement, Good air movement  bilaterally, fine rales RRR,No Gallops, Rubs or new Murmurs,  +ve B.Sounds, Abd Soft, No tenderness,    Left lower extremity mid calf & lateral foot/ 5th ray amputation incision sites with staples and stable,  resolved surrounding cellulitis,   Data Review:   Recent Labs  Lab 07/18/22 1252 07/18/22 2036 07/19/22 0500 07/20/22 0329 07/20/22 2207 07/21/22 0442 07/22/22 0505 07/23/22 0419  WBC 8.5  --  8.2 7.8  --  6.2 5.8 5.5  HGB 8.0*   < > 7.2* 7.6* 7.1* 7.3* 8.9* 10.2*  HCT 23.9*   < > 21.7* 21.8* 20.3* 21.2* 25.9* 29.8*  PLT 111*  --  94* 95*  --  99* 110* 147*  MCV 90.9  --  91.6 88.6  --  89.8 89.6 89.8  MCH 30.4  --  30.4 30.9  --  30.9 30.8 30.7  MCHC 33.5  --  33.2 34.9  --  34.4 34.4 34.2  RDW 16.3*  --  16.6* 16.2*  --  16.3* 15.5 15.3  LYMPHSABS 0.5*  --   --  0.8  --  1.1 1.2 0.9  MONOABS 0.2  --   --  0.3  --  0.3 0.4 0.3  EOSABS 0.1  --   --  0.2  --  0.2 0.2 0.0  BASOSABS 0.0  --   --  0.0  --  0.0 0.0 0.2*   < > = values in this interval not displayed.    Recent Labs  Lab 07/18/22 1252 07/18/22 1532 07/18/22 1841 07/19/22 0500 07/20/22 0329 07/21/22 0442 07/21/22 1053 07/22/22 0505 07/23/22 0419  NA 126*  --   --  128* 126* 130*  --  132* 133*  K 4.5  --   --  3.5 3.6 3.4*  --  3.8 3.6  CL 96*  --   --  99 101 102  --  98 98  CO2 20*  --   --  18* 16* 18*  --  19* 23  GLUCOSE 62*  --   --  127* 153* 132*  --  136* 134*  BUN 40*  --   --  39* 36* 29*  --  25* 21  CREATININE 2.38*  --   --  2.36* 2.30* 2.04*  --  1.80* 1.71*  CALCIUM 7.6*  --   --  7.2* 7.5* 7.9*  --  8.8* 8.7*  AST 53*  --   --  25  --  27  --   --   --   ALT 21  --   --  18  --  22  --   --   --   ALKPHOS 56  --   --  44  --  49  --   --   --   BILITOT 1.5*  --   --  0.7  --  0.9  --   --   --   ALBUMIN 2.7*  --   --  2.4*  --  2.2*  --   --   --   MG  --   --   --   --   --  1.7  --  1.9 1.4*  PHOS  --   --   --   --   --  3.7  --   --   --   CRP  --   --   --   --   --   --  3.8* 2.5* 1.6*  LATICACIDVEN 1.7 1.0  --   --   --   --   --   --   --   INR 1.4*  --   --   --   --   --   --   --   --   TSH  --   --   --   --  2.527  --   --   --   --   BNP  --   --  809.5*  --   --   --   --  1,916.7* 1,239.2*    Radiology Reports DG Chest Port 1 View  Result Date: 07/22/2022 CLINICAL DATA:  81 year old male with history of shortness of breath. EXAM: PORTABLE CHEST 1 VIEW COMPARISON:  Chest x-ray 07/21/2022. FINDINGS: There is cephalization of the pulmonary vasculature and slight indistinctness of the interstitial markings suggestive of mild pulmonary edema. No definite pleural effusions. No pneumothorax. Heart size is mildly enlarged. Upper mediastinal contours are within normal limits. Atherosclerotic calcifications are noted in the thoracic aorta. Status post median sternotomy for CABG. IMPRESSION: 1. The appearance of the chest is most suggestive of congestive heart failure, as above. Electronically Signed   By: Vinnie Langton M.D.   On: 07/22/2022 06:37   VAS US CAROTID  Result Date: 07/21/2022 Carotid Arterial Duplex Study Patient Name:  Drewey Begue.  Date of Exam:   07/21/2022 Medical Rec #: [id number]         Accession #:    [id number] Date of Birth: 10/11/1941          Patient Gender: M Patient Age:   38 years Exam Location:  [facility] Procedure:      VAS US CAROTID Referring Phys: Elwin Sleight [place] --------------------------------------------------------------------------------  Indications:       CVA. Risk Factors:      Hypertension, hyperlipidemia, Diabetes, past history of                    smoking, coronary artery disease. Comparison Study:  No prior study Performing Technologist: Maudry Mayhew MHA, RDMS, RVT, RDCS  Examination Guidelines: A complete evaluation includes B-mode imaging, spectral Doppler, color Doppler, and power Doppler as needed of all accessible portions of each vessel. Bilateral testing is considered an integral part of a  complete examination. Limited examinations for reoccurring indications may be performed as noted.  Right Carotid Findings: +----------+--------+--------+--------+------------------------------+---------+           PSV cm/sEDV cm/sStenosisPlaque Description            Comments  +----------+--------+--------+--------+------------------------------+---------+ CCA Prox  62      10                                                      +----------+--------+--------+--------+------------------------------+---------+ CCA Distal63      8               heterogenous and calcific     Shadowing +----------+--------+--------+--------+------------------------------+---------+ ICA Prox  151     27      1-39%   heterogenous, irregular and   Shadowing  calcific                                +----------+--------+--------+--------+------------------------------+---------+ ICA Mid   131     18                                                      +----------+--------+--------+--------+------------------------------+---------+ ICA Distal85      17                                                      +----------+--------+--------+--------+------------------------------+---------+ ECA       144                     heterogenous and irregular              +----------+--------+--------+--------+------------------------------+---------+ +----------+--------+-------+----------------+-------------------+           PSV cm/sEDV cmsDescribe        Arm Pressure (mmHG) +----------+--------+-------+----------------+-------------------+ [id number]            Multiphasic, WNL                    +----------+--------+-------+----------------+-------------------+ +---------+--------+--+--------+-+---------+ VertebralPSV cm/s46EDV cm/s7Antegrade +---------+--------+--+--------+-+---------+  Left Carotid Findings:  +----------+--------+--------+--------+------------------------------+---------+           PSV cm/sEDV cm/sStenosisPlaque Description            Comments  +----------+--------+--------+--------+------------------------------+---------+ CCA Prox  64      12                                                      +----------+--------+--------+--------+------------------------------+---------+ CCA Distal76      14              heterogenous and irregular              +----------+--------+--------+--------+------------------------------+---------+ ICA Prox  78      18              heterogenous, irregular and   Shadowing                                   calcific                                +----------+--------+--------+--------+------------------------------+---------+ ICA Distal83      20                                                      +----------+--------+--------+--------+------------------------------+---------+ ECA       108                                                             +----------+--------+--------+--------+------------------------------+---------+ +----------+--------+--------+----------------+-------------------+  PSV cm/sEDV cm/sDescribe        Arm Pressure (mmHG) +----------+--------+--------+----------------+-------------------+ [id number]             Multiphasic, WNL                    +----------+--------+--------+----------------+-------------------+ +---------+--------+--+--------+--+---------+ VertebralPSV cm/s39EDV cm/s12Antegrade +---------+--------+--+--------+--+---------+   Summary: Right Carotid: Velocities in the right ICA are consistent with a 1-39% stenosis. Left Carotid: Velocities in the left ICA are consistent with a 1-39% stenosis. Vertebrals:  Bilateral vertebral arteries demonstrate antegrade flow. Subclavians: Normal flow hemodynamics were seen in bilateral subclavian              arteries.  *See table(s) above for measurements and observations.  Electronically signed by Harold Barban MD on 07/21/2022 at 9:07:46 PM.    Final    DG Chest Port 1 View  Result Date: 07/21/2022 CLINICAL DATA:  81 year old male with history of shortness of breath. Weakness. EXAM: PORTABLE CHEST 1 VIEW COMPARISON:  Chest x-ray 07/20/2022. FINDINGS: There is cephalization of the pulmonary vasculature and slight indistinctness of the interstitial markings suggestive of mild pulmonary edema. No definite pleural effusions. No pneumothorax. Mild cardiomegaly. Upper mediastinal contours are within normal limits. Atherosclerotic calcifications are noted in the thoracic aorta. Status post median sternotomy for CABG. IMPRESSION: 1. The appearance of the chest is most suggestive of congestive heart failure, as above. Electronically Signed   By: Vinnie Langton M.D.   On: 07/21/2022 06:15   US RENAL  Result Date: 07/20/2022 CLINICAL DATA:  Acute renal insufficiency EXAM: RENAL / URINARY TRACT ULTRASOUND COMPLETE COMPARISON:  None Available. FINDINGS: Right Kidney: Renal measurements: 11.4 x 5.3 x 4.7 cm = volume: 145 mL. Echogenicity within normal limits. No mass or hydronephrosis visualized. Left Kidney: Renal measurements: 12.5 x 4.6 x 4.5 cm = volume: 136 mL. Echogenicity within normal limits. No mass or hydronephrosis visualized. Bladder: Foley catheter balloon seen within a partially decompressed bladder lumen Other: None. IMPRESSION: Normal renal sonogram. Electronically Signed   By: Fidela Salisbury M.D.   On: 07/20/2022 23:25   DG CHEST PORT 1 VIEW  Result Date: 07/20/2022 CLINICAL DATA:  Hyponatremia. EXAM: PORTABLE CHEST 1 VIEW COMPARISON:  Chest x-ray dated July 18, 2022. FINDINGS: Unchanged borderline cardiomegaly status post CABG. New diffuse interstitial thickening. No focal consolidation, pleural effusion, or pneumothorax. No acute osseous abnormality. IMPRESSION: 1. New mild interstitial pulmonary edema.  Electronically Signed   By: Titus Dubin M.D.   On: 07/20/2022 11:00   MR ANGIO HEAD WO CONTRAST  Result Date: 07/20/2022 CLINICAL DATA:  81 year old male with altered mental status, TIA, evidence of punctate white matter ischemia in the left hemisphere on MRI 2 days ago. EXAM: MRA HEAD WITHOUT CONTRAST TECHNIQUE: Angiographic images of the Circle of Willis were acquired using MRA technique without intravenous contrast. COMPARISON:  Neck MRA today reported separately. Brain MRI 07/18/2022. CTA head and neck 05/20/2020. FINDINGS: Anterior circulation: Both ICA siphons are patent but irregular with evidence of calcified atherosclerosis as demonstrated on the prior CTA. Moderate to severe right siphon stenosis suspected in the supraclinoid segment on series 5, image 105. No significant left siphon stenosis. Both carotid termini remain patent. Dominant left and diminutive or absent right ACA A1 segments. No left A1 stenosis. Anterior communicating artery and visible ACA branches are stable and within normal limits. Right MCA M1 segment and bifurcation remain patent. But multifocal Severe right MCA M2 and M3 branch stenoses appear progressed (series 1047, image 16). "Duplicated" left MCA M1  segment again noted. Chronic severe irregularity and stenoses in the region of the left MCA bifurcation appears similar to the 2021 CTA. Left MCA branches appear stable since that time. Posterior circulation: Antegrade flow in the posterior circulation with chronic severe distal right vertebral artery irregularity and stenosis (series 5, image 12). Less pronounced but up to moderate left vertebral V4 segment stenosis. Both distal vertebral arteries, vertebrobasilar junction, and basilar artery remain patent. No significant basilar stenosis. Both PICA origins appear patent.SCA origins are patent. Multifocal mild to moderate left PCA irregularity and stenosis appears stable since the 2021 CTA. But right P1 and P2 segment tandem  severe stenoses have progressed since that time (series 1035, image 10). Distal right PCA branches remain patent. Anatomic variants: "Duplicated" left MCA M1 segment. Dominant left and diminutive or absent right ACA A1. Other: No intracranial mass effect or ventriculomegaly. IMPRESSION: 1. Chronic severe intracranial atherosclerosis, stable since a 2021 CTA (see #2) with the following exceptions: - new tandem Severe tandem stenoses of the Right PCA. - new multifocal Severe Right MCA M2 and M3 branch stenoses. 2. Moderate to severe chronic Left MCA M2 stenoses. Moderate (Left) and severe (Right) distal vertebral artery stenoses. Moderate to severe supraclinoid Right ICA stenosis. Electronically Signed   By: Genevie Ann M.D.   On: 07/20/2022 08:46   MR ANGIO NECK WO CONTRAST  Result Date: 07/20/2022 CLINICAL DATA:  81 year old male with altered mental status, TIA, evidence of punctate white matter ischemia in the left hemisphere on MRI 2 days ago. EXAM: MRA NECK WITHOUT CONTRAST TECHNIQUE: Angiographic images of the neck were acquired using MRA technique without intravenous contrast. Carotid stenosis measurements (when applicable) are obtained utilizing NASCET criteria, using the distal internal carotid diameter as the denominator. COMPARISON:  Brain MRI 07/18/2022.  CTA head and neck 05/20/2020. FINDINGS: Aortic arch: Calcified aortic atherosclerosis. 3 vessel arch configuration demonstrated on the prior CTA. Right carotid system: Patent with antegrade flow signal to the skull base. Irregularity at the right carotid bifurcation corresponds to bulky atherosclerosis demonstrated by CTA in 2021. Hemodynamically significant stenosis at that time (estimated at 75%). No definite flow gap on MRA today. Left carotid system: Antegrade flow to the skull base. Mild irregularity at the left carotid bifurcation corresponding to atherosclerosis demonstrated on prior CTA. No evidence of hemodynamically significant stenosis.  Vertebral arteries: Codominant as on the prior CTA. Antegrade flow signal in both vertebral arteries to the skull base. Both vertebral artery origins were better demonstrated by CTA. Mild to moderate right V2 segment irregularity and stenosis redemonstrated on series 6, image 117. No evidence of left V2 stenosis. Other: None. IMPRESSION: 1. Chronic right carotid bifurcation atherosclerosis with hemodynamically significant proximal Right ICA stenosis (estimated at 75% by CTA in 2021). The right ICA remains patent. 2. Chronic left carotid atherosclerosis without evidence of hemodynamically significant stenosis. 3. Chronic vertebral artery atherosclerosis, more pronounced on the right and up to moderate in the right V2 segment. Both vertebral arteries remain patent to the skull base. Electronically Signed   By: Genevie Ann M.D.   On: 07/20/2022 08:37

## 2022-07-23 NOTE — TOC Progression Note (Signed)
Transition of Care (TOC) - Progression Note    Patient Details  Name: Steve [name]. MRN: [id number] Date of Birth: 07/16/1941  Transition of Care [facility]) CM/SW [place], LCSW Phone Number: 07/23/2022, 11:26 AM  Clinical Narrative:    CSW notified [facility] that patient will be ready tomorrow. Per [employer], they do not have staff to admit patient tomorrow but can accept on Sunday.    Expected Discharge Plan: Skilled Nursing Facility Barriers to Discharge: SNF Pending bed offer, Continued Medical Work up  Expected Discharge Plan and Services Expected Discharge Plan: [place] In-house Referral: Clinical Social Work   Post Acute Care Choice: [place] Living arrangements for the past 2 months: [place]                                       Social Determinants of Health (SDOH) Interventions    Readmission Risk Interventions    07/22/2022   11:13 AM 07/13/2022   10:22 AM  Readmission Risk Prevention Plan  Transportation Screening Complete Complete  PCP or Specialist Appt within 3-5 Days  Complete  HRI or [place]  Complete  Social Work Consult for [place] Planning/Counseling  Complete  Palliative Care Screening  Not Applicable  Medication Review Press photographer) Complete Complete  PCP or Specialist appointment within 3-5 days of discharge Complete   HRI or [place] Complete   SW Recovery Care/Counseling Consult Complete   Palliative Care Screening Not [place] Complete

## 2022-07-23 NOTE — Discharge Instructions (Signed)

## 2022-07-24 DIAGNOSIS — L03116 Cellulitis of left lower limb: Secondary | ICD-10-CM | POA: Diagnosis not present

## 2022-07-24 LAB — CBC WITH DIFFERENTIAL/PLATELET
Abs Immature Granulocytes: 0.02 10*3/uL (ref 0.00–0.07)
Basophils Absolute: 0.1 10*3/uL (ref 0.0–0.1)
Basophils Relative: 1 %
Eosinophils Absolute: 0.1 10*3/uL (ref 0.0–0.5)
Eosinophils Relative: 2 %
HCT: 30.3 % — ABNORMAL LOW (ref 39.0–52.0)
Hemoglobin: 10.2 g/dL — ABNORMAL LOW (ref 13.0–17.0)
Immature Granulocytes: 0 %
Lymphocytes Relative: 21 %
Lymphs Abs: 1 10*3/uL (ref 0.7–4.0)
MCH: 30.5 pg (ref 26.0–34.0)
MCHC: 33.7 g/dL (ref 30.0–36.0)
MCV: 90.7 fL (ref 80.0–100.0)
Monocytes Absolute: 0.4 10*3/uL (ref 0.1–1.0)
Monocytes Relative: 7 %
Neutro Abs: 3.4 10*3/uL (ref 1.7–7.7)
Neutrophils Relative %: 69 %
Platelets: 153 10*3/uL (ref 150–400)
RBC: 3.34 MIL/uL — ABNORMAL LOW (ref 4.22–5.81)
RDW: 15.7 % — ABNORMAL HIGH (ref 11.5–15.5)
WBC: 5 10*3/uL (ref 4.0–10.5)
nRBC: 0 % (ref 0.0–0.2)

## 2022-07-24 LAB — BASIC METABOLIC PANEL
Anion gap: 15 (ref 5–15)
BUN: 21 mg/dL (ref 8–23)
CO2: 21 mmol/L — ABNORMAL LOW (ref 22–32)
Calcium: 9.3 mg/dL (ref 8.9–10.3)
Chloride: 97 mmol/L — ABNORMAL LOW (ref 98–111)
Creatinine, Ser: 1.82 mg/dL — ABNORMAL HIGH (ref 0.61–1.24)
GFR, Estimated: 36 mL/min — ABNORMAL LOW (ref >60)
Glucose, Bld: 159 mg/dL — ABNORMAL HIGH (ref 70–99)
Potassium: 4.2 mmol/L (ref 3.5–5.1)
Sodium: 133 mmol/L — ABNORMAL LOW (ref 135–145)

## 2022-07-24 LAB — GLUCOSE, CAPILLARY
Glucose-Capillary: 140 mg/dL — ABNORMAL HIGH (ref 70–99)
Glucose-Capillary: 147 mg/dL — ABNORMAL HIGH (ref 70–99)
Glucose-Capillary: 115 mg/dL — ABNORMAL HIGH (ref 70–99)
Glucose-Capillary: 138 mg/dL — ABNORMAL HIGH (ref 70–99)

## 2022-07-24 LAB — CULTURE, BLOOD (ROUTINE X 2)
Culture: NO GROWTH
Special Requests: ADEQUATE

## 2022-07-24 LAB — MAGNESIUM: Magnesium: 2.2 mg/dL (ref 1.7–2.4)

## 2022-07-24 LAB — BRAIN NATRIURETIC PEPTIDE: B Natriuretic Peptide: 534.5 pg/mL — ABNORMAL HIGH (ref 0.0–100.0)

## 2022-07-24 LAB — C-REACTIVE PROTEIN: CRP: 0.8 mg/dL (ref <1.0)

## 2022-07-24 MED ORDER — FUROSEMIDE 40 MG PO TABS
40.0000 mg | ORAL_TABLET | Freq: Every day | ORAL | Status: DC
  Administered 2022-07-24 – 2022-07-26 (×3): 40 mg via ORAL
  Filled 2022-07-24 (×3): qty 1

## 2022-07-24 MED ORDER — POLYETHYLENE GLYCOL 3350 17 G PO PACK: 17.0000 g | PACK | Freq: Every day | ORAL | 0 refills | Status: DC | PRN

## 2022-07-24 MED ORDER — FUROSEMIDE 40 MG PO TABS: 40.0000 mg | ORAL_TABLET | Freq: Every day | ORAL | Status: DC

## 2022-07-24 MED ORDER — PANTOPRAZOLE SODIUM 40 MG PO TBEC: 40.0000 mg | DELAYED_RELEASE_TABLET | Freq: Two times a day (BID) | ORAL | Status: AC

## 2022-07-24 MED ORDER — SPIRONOLACTONE 25 MG PO TABS
25.0000 mg | ORAL_TABLET | Freq: Every day | ORAL | Status: DC
  Administered 2022-07-24 – 2022-07-26 (×3): 25 mg via ORAL
  Filled 2022-07-24 (×3): qty 1

## 2022-07-24 MED ORDER — EZETIMIBE 10 MG PO TABS: 10.0000 mg | ORAL_TABLET | Freq: Every day | ORAL | 3 refills | Status: AC

## 2022-07-24 MED ORDER — SPIRONOLACTONE 25 MG PO TABS: 12.5000 mg | ORAL_TABLET | Freq: Every day | ORAL | Status: DC

## 2022-07-24 MED ORDER — INSULIN ASPART 100 UNIT/ML FLEXPEN: PEN_INJECTOR | SUBCUTANEOUS | 0 refills | Status: DC

## 2022-07-24 MED ORDER — DOXYCYCLINE HYCLATE 100 MG PO TABS: 100.0000 mg | ORAL_TABLET | Freq: Two times a day (BID) | ORAL | Status: DC

## 2022-07-24 MED ORDER — CEFADROXIL 500 MG PO CAPS: 500.0000 mg | ORAL_CAPSULE | Freq: Two times a day (BID) | ORAL | 0 refills | Status: AC

## 2022-07-24 NOTE — Plan of Care (Signed)
Problem: Fluid Volume: Goal: Hemodynamic stability will improve 07/24/2022 1125 by Vonna Kotyk, RN Outcome: Adequate for Discharge 07/24/2022 0756 by Vonna Kotyk, RN Outcome: Progressing   Problem: Clinical Measurements: Goal: Diagnostic test results will improve 07/24/2022 1125 by Vonna Kotyk, RN Outcome: Adequate for Discharge 07/24/2022 0756 by Vonna Kotyk, RN Outcome: Progressing Goal: Signs and symptoms of infection will decrease 07/24/2022 1125 by Vonna Kotyk, RN Outcome: Adequate for Discharge 07/24/2022 0756 by Vonna Kotyk, RN Outcome: Progressing   Problem: Respiratory: Goal: Ability to maintain adequate ventilation will improve 07/24/2022 1125 by Vonna Kotyk, RN Outcome: Adequate for Discharge 07/24/2022 0756 by Vonna Kotyk, RN Outcome: Progressing   Problem: Education: Goal: Understanding of CV disease, CV risk reduction, and recovery process will improve 07/24/2022 1125 by Vonna Kotyk, RN Outcome: Adequate for Discharge 07/24/2022 0756 by Vonna Kotyk, RN Outcome: Progressing Goal: Individualized Educational Video(s) 07/24/2022 1125 by Vonna Kotyk, RN Outcome: Adequate for Discharge 07/24/2022 0756 by Vonna Kotyk, RN Outcome: Progressing   Problem: Activity: Goal: Ability to return to baseline activity level will improve 07/24/2022 1125 by Vonna Kotyk, RN Outcome: Adequate for Discharge 07/24/2022 0756 by Vonna Kotyk, RN Outcome: Progressing   Problem: Cardiovascular: Goal: Ability to achieve and maintain adequate cardiovascular perfusion will improve 07/24/2022 1125 by Vonna Kotyk, RN Outcome: Adequate for Discharge 07/24/2022 0756 by Vonna Kotyk, RN Outcome: Progressing Goal: Vascular access site(s) Level 0-1 will be maintained 07/24/2022 1125 by Vonna Kotyk, RN Outcome: Adequate for Discharge 07/24/2022 0756 by Vonna Kotyk, RN Outcome: Progressing   Problem: Health Behavior/Discharge Planning: Goal: Ability to safely manage  health-related needs after discharge will improve 07/24/2022 1125 by Vonna Kotyk, RN Outcome: Adequate for Discharge 07/24/2022 0756 by Vonna Kotyk, RN Outcome: Progressing   Problem: Education: Goal: Knowledge of General Education information will improve Description: Including pain rating scale, medication(s)/side effects and non-pharmacologic comfort measures 07/24/2022 1125 by Vonna Kotyk, RN Outcome: Adequate for Discharge 07/24/2022 0756 by Vonna Kotyk, RN Outcome: Progressing   Problem: Health Behavior/Discharge Planning: Goal: Ability to manage health-related needs will improve 07/24/2022 1125 by Vonna Kotyk, RN Outcome: Adequate for Discharge 07/24/2022 0756 by Vonna Kotyk, RN Outcome: Progressing   Problem: Clinical Measurements: Goal: Ability to maintain clinical measurements within normal limits will improve 07/24/2022 1125 by Vonna Kotyk, RN Outcome: Adequate for Discharge 07/24/2022 0756 by Vonna Kotyk, RN Outcome: Progressing Goal: Will remain free from infection 07/24/2022 1125 by Vonna Kotyk, RN Outcome: Adequate for Discharge 07/24/2022 0756 by Vonna Kotyk, RN Outcome: Progressing Goal: Diagnostic test results will improve 07/24/2022 1125 by Vonna Kotyk, RN Outcome: Adequate for Discharge 07/24/2022 0756 by Vonna Kotyk, RN Outcome: Progressing Goal: Respiratory complications will improve 07/24/2022 1125 by Vonna Kotyk, RN Outcome: Adequate for Discharge 07/24/2022 0756 by Vonna Kotyk, RN Outcome: Progressing Goal: Cardiovascular complication will be avoided 07/24/2022 1125 by Vonna Kotyk, RN Outcome: Adequate for Discharge 07/24/2022 0756 by Vonna Kotyk, RN Outcome: Progressing   Problem: Activity: Goal: Risk for activity intolerance will decrease 07/24/2022 1125 by Vonna Kotyk, RN Outcome: Adequate for Discharge 07/24/2022 0756 by Vonna Kotyk, RN Outcome: Progressing   Problem: Nutrition: Goal: Adequate nutrition will be  maintained 07/24/2022 1125 by Vonna Kotyk, RN Outcome: Adequate for Discharge 07/24/2022 0756 by Vonna Kotyk, RN Outcome: Progressing   Problem: Coping: Goal: Level  of anxiety will decrease 07/24/2022 1125 by Vonna Kotyk, RN Outcome: Adequate for Discharge 07/24/2022 0756 by Vonna Kotyk, RN Outcome: Progressing   Problem: Elimination: Goal: Will not experience complications related to bowel motility 07/24/2022 1125 by Vonna Kotyk, RN Outcome: Adequate for Discharge 07/24/2022 0756 by Vonna Kotyk, RN Outcome: Progressing Goal: Will not experience complications related to urinary retention 07/24/2022 1125 by Vonna Kotyk, RN Outcome: Adequate for Discharge 07/24/2022 0756 by Vonna Kotyk, RN Outcome: Progressing   Problem: Pain Managment: Goal: General experience of comfort will improve 07/24/2022 1125 by Vonna Kotyk, RN Outcome: Adequate for Discharge 07/24/2022 0756 by Vonna Kotyk, RN Outcome: Progressing   Problem: Safety: Goal: Ability to remain free from injury will improve 07/24/2022 1125 by Vonna Kotyk, RN Outcome: Adequate for Discharge 07/24/2022 0756 by Vonna Kotyk, RN Outcome: Progressing   Problem: Skin Integrity: Goal: Risk for impaired skin integrity will decrease 07/24/2022 1125 by Vonna Kotyk, RN Outcome: Adequate for Discharge 07/24/2022 0756 by Vonna Kotyk, RN Outcome: Progressing   Problem: Clinical Measurements: Goal: Ability to avoid or minimize complications of infection will improve 07/24/2022 1125 by Vonna Kotyk, RN Outcome: Adequate for Discharge 07/24/2022 0756 by Vonna Kotyk, RN Outcome: Progressing   Problem: Skin Integrity: Goal: Skin integrity will improve 07/24/2022 1125 by Vonna Kotyk, RN Outcome: Adequate for Discharge 07/24/2022 0756 by Vonna Kotyk, RN Outcome: Progressing   Problem: Education: Goal: Knowledge of disease or condition will improve 07/24/2022 1125 by Vonna Kotyk, RN Outcome: Adequate for  Discharge 07/24/2022 0756 by Vonna Kotyk, RN Outcome: Progressing Goal: Knowledge of secondary prevention will improve (SELECT ALL) 07/24/2022 1125 by Vonna Kotyk, RN Outcome: Adequate for Discharge 07/24/2022 0756 by Vonna Kotyk, RN Outcome: Progressing Goal: Knowledge of patient specific risk factors will improve (INDIVIDUALIZE FOR PATIENT) 07/24/2022 1125 by Vonna Kotyk, RN Outcome: Adequate for Discharge 07/24/2022 0756 by Vonna Kotyk, RN Outcome: Progressing Goal: Individualized Educational Video(s) 07/24/2022 1125 by Vonna Kotyk, RN Outcome: Adequate for Discharge 07/24/2022 0756 by Vonna Kotyk, RN Outcome: Progressing   Problem: Coping: Goal: Will verbalize positive feelings about self 07/24/2022 1125 by Vonna Kotyk, RN Outcome: Adequate for Discharge 07/24/2022 0756 by Vonna Kotyk, RN Outcome: Progressing Goal: Will identify appropriate support needs 07/24/2022 1125 by Vonna Kotyk, RN Outcome: Adequate for Discharge 07/24/2022 0756 by Vonna Kotyk, RN Outcome: Progressing   Problem: Health Behavior/Discharge Planning: Goal: Ability to manage health-related needs will improve 07/24/2022 1125 by Vonna Kotyk, RN Outcome: Adequate for Discharge 07/24/2022 0756 by Vonna Kotyk, RN Outcome: Progressing   Problem: Self-Care: Goal: Ability to participate in self-care as condition permits will improve 07/24/2022 1125 by Vonna Kotyk, RN Outcome: Adequate for Discharge 07/24/2022 0756 by Vonna Kotyk, RN Outcome: Progressing Goal: Verbalization of feelings and concerns over difficulty with self-care will improve 07/24/2022 1125 by Vonna Kotyk, RN Outcome: Adequate for Discharge 07/24/2022 0756 by Vonna Kotyk, RN Outcome: Progressing Goal: Ability to communicate needs accurately will improve 07/24/2022 1125 by Vonna Kotyk, RN Outcome: Adequate for Discharge 07/24/2022 0756 by Vonna Kotyk, RN Outcome: Progressing   Problem: Nutrition: Goal: Risk of  aspiration will decrease 07/24/2022 1125 by Vonna Kotyk, RN Outcome: Adequate for Discharge 07/24/2022 0756 by Vonna Kotyk, RN Outcome: Progressing Goal: Dietary intake will improve 07/24/2022 1125 by Kayla Deshaies A, RN Outcome: Adequate for  Discharge 07/24/2022 0756 by Vonna Kotyk, RN Outcome: Progressing   Problem: Intracerebral Hemorrhage Tissue Perfusion: Goal: Complications of Intracerebral Hemorrhage will be minimized 07/24/2022 1125 by Vonna Kotyk, RN Outcome: Adequate for Discharge 07/24/2022 0756 by Vonna Kotyk, RN Outcome: Progressing   Problem: Ischemic Stroke/TIA Tissue Perfusion: Goal: Complications of ischemic stroke/TIA will be minimized 07/24/2022 1125 by Tiersa Dayley A, RN Outcome: Adequate for Discharge 07/24/2022 0756 by Vonna Kotyk, RN Outcome: Progressing   Problem: Spontaneous Subarachnoid Hemorrhage Tissue Perfusion: Goal: Complications of Spontaneous Subarachnoid Hemorrhage will be minimized 07/24/2022 1125 by Vonna Kotyk, RN Outcome: Adequate for Discharge 07/24/2022 0756 by Vonna Kotyk, RN Outcome: Progressing   Problem: Education: Goal: Ability to describe self-care measures that may prevent or decrease complications (Diabetes Survival Skills Education) will improve 07/24/2022 1125 by Vonna Kotyk, RN Outcome: Adequate for Discharge 07/24/2022 0756 by Vonna Kotyk, RN Outcome: Progressing Goal: Individualized Educational Video(s) 07/24/2022 1125 by Vonna Kotyk, RN Outcome: Adequate for Discharge 07/24/2022 0756 by Vonna Kotyk, RN Outcome: Progressing   Problem: Coping: Goal: Ability to adjust to condition or change in health will improve 07/24/2022 1125 by Vonna Kotyk, RN Outcome: Adequate for Discharge 07/24/2022 0756 by Vonna Kotyk, RN Outcome: Progressing   Problem: Fluid Volume: Goal: Ability to maintain a balanced intake and output will improve 07/24/2022 1125 by Vonna Kotyk, RN Outcome: Adequate for Discharge 07/24/2022  0756 by Vonna Kotyk, RN Outcome: Progressing

## 2022-07-24 NOTE — Discharge Summary (Cosign Needed Addendum)
[name] ZOX:[id number] DOB: 24-Jul-1941 DOA: 07/18/2022  PCP: System, Provider Not In  Admit date: 07/18/2022  Discharge date: 07/26/2022  Admitted From: SNF   Disposition:  SNF    Recommendations for Outpatient Follow-up:   Follow up with PCP in 1-2 weeks  PCP Please obtain BMP/CBC, 2 view CXR in 1week,  (see Discharge instructions)   PCP Please follow up on the following pending results: Close outpatient follow-up with urology and vascular surgery within a week.   DC SUMMARY ADDENDUM 08/06/2022 [facility] called in regards to statin medication and reported allergy. Pt last presented to the pharmacy with an atorvastatin prescription but our dc meds stated that med had been stopped in favor of pravastatin prior to admission. The pharmacist has no record of that script. I recommended that the patient return to his PCP to clarify risk and benefits of continued statin use before filling script for statins.  Erin Hearing, ANP  Home Health: None   Equipment/Devices: None  Consultations: VVS, GI Discharge Condition: Fair   CODE STATUS: Full    Diet Recommendation: Heart Healthy low carbohydrate with 1.5 L fluid restriction per day  Chief Complaint  Patient presents with   Code Sepsis     Brief history of present illness from the day of admission and additional interim summary     81 y.o. male with PMH significant for DM2, HTN, HLD, PAD with intermittent claudication, CAD/CABG/stents, ischemic cardiomyopathy, paroxysmal A-fib on Eliquis, right bundle branch block. Recently hospitalized 9/1 to 9/19 for sepsis secondary to left foot cellulitis/osteomyelitis 07/07/22, underwent left common femoral to peroneal bypass with PTFE by vascular surgeon Dr. Stanford Breed, 07/09/22, underwent fifth toe amputation by  podiatry 07/13/22.    He was discharged back to independent living at [facility] on 07/13/22 with an indwelling Foley catheter for urinary retention.  He had received IV antibiotics during the stay and was discharged on oral doxycycline. Post discharge, patient continued to have pain at the surgical site.   He presented back from independent living to ER on 07/18/2022 with fever, shortness of breath, was diagnosed with left leg surgical site cellulitis, possible new CVA, hypoglycemia and was admitted to the hospital.                                                                   Hospital Course    Left leg cellulitis with recent left common femoral, peroneal artery bypass with 6 mm PTFE subcutaneous tunnel, recent left foot fifth ray amputation - HE has been seen by vascular surgery, currently on IV empiric antibiotics with improvement in cellulitis.  Vascular surgery wants prolonged IV antibiotic course to avoid any extension of infection to the vascular graft, appreciate ID input.  Stable for discharge with outpatient vascular surgery follow-up within  1 week postdischarge.   Per ID switch to Doxy 100 mg bid and cefadroxil 500 bid on 07/24/22 - plan total abx 4 more weeks from with stop date 08/15/2022      Acute stroke with MRI showing 2 distinct punctate foci of restricted diffusion involving white matter of the left coronary radiata  - SEEN by stroke team, MRI noted with diffuse intravascular disease, LDL stable A1c was 7.9, seen by stroke team.  Case discussed with GI okay to resume Eliquis.   Acute GI blood loss related anemia.  Getting packed RBC transfusion on 07/21/2022, on IV PPI, Eliquis as above.  GI on board.  EGD unrevealing on 07/21/2022.  Defer further work-up to GI.  CBC currently stable continue to monitor.   Acute on chronic combined systolic and diastolic heart failure.  EF 35 to 40%.  Continue beta-blocker, Imdur, IV Lasix for diuresis, underlying CKD precludes the use of  ACE/ARB.  Continue to monitor.  Acute decompensation due to GI bleed.   CAD history of CABG, stents, PAD .  Continue beta-blocker, and statin for secondary prevention.   Paroxysmal atrial fibrillation.  Mali vas 2 score of greater than 5.  On beta-blocker, Eliquis as tolerated.   AKI on CKD 3B.  Baseline creatinine 1.5 - 1.8, decompensation due to combination of CHF and blood loss anemia. Transfused and diuresed, now back to baseline.   Recent urinary retention.  Had indwelling Foley catheter placed last admission, continued, Flomax added on 07/21/2022, post discharge outpatient urology follow-up, to be arranged by SNF.     Dyslipidemia.  On statin.   DM type II.  on ISS only with stable blood sugars, continue Glucophage and sliding scale combination check CBGs q. ACH S at SNF and adjust as needed.  Did not require Lantus here.  CBG (last 3)  Recent Labs    07/25/22 1208 07/25/22 2137 07/26/22 0750  GLUCAP 121* 132* 164*     Discharge diagnosis     Principal Problem:   Cellulitis of left lower extremity Active Problems:   Hypertension   Hyperlipidemia   Diabetes mellitus, type 2 (HCC)   CAD (coronary artery disease)   PAF (paroxysmal atrial fibrillation) (HCC)   Normocytic anemia   Chronic combined systolic and diastolic CHF (congestive heart failure) (HCC)   Acute blood loss anemia   Moderate protein malnutrition (HCC)   GI bleed   Hypoglycemia    Discharge instructions    Discharge Instructions     Discharge instructions   Complete by: As directed    Follow with Primary MD  in 7 days   Get CBC, CMP, magnesium, 2 view Chest X ray -  checked next visit within 1 week by SNF MD    Activity: As tolerated with Full fall precautions use walker/cane & assistance as needed  Disposition SNF  Diet: Heart Healthy low carbohydrate diet, 1.5 L fluid restriction per day, with feeding assistance and aspiration precautions.  Check CBGs q. [place].   Special Instructions: If  you have smoked or chewed Tobacco  in the last 2 yrs please stop smoking, stop any regular Alcohol  and or any Recreational drug use.  On your next visit with your primary care physician please Get Medicines reviewed and adjusted.  Please request your Prim.MD to go over all Hospital Tests and Procedure/Radiological results at the follow up, please get all Hospital records sent to your Prim MD by signing hospital release before you go home.  If you experience  worsening of your admission symptoms, develop shortness of breath, life threatening emergency, suicidal or homicidal thoughts you must seek medical attention immediately by calling 911 or calling your MD immediately  if symptoms less severe.  You Must read complete instructions/literature along with all the possible adverse reactions/side effects for all the Medicines you take and that have been prescribed to you. Take any new Medicines after you have completely understood and accpet all the possible adverse reactions/side effects.   Discharge wound care:   Complete by: As directed    Left leg incision sites clean and dry.  Follow-up with vascular surgeon within a week of discharge.   Increase activity slowly   Complete by: As directed    Type and screen   Complete by: As directed    [place]        Discharge Medications   Allergies as of 07/26/2022       Reactions   Januvia [sitagliptin] Other (See Comments)   Soreness to stomach area. Intolerance    Statins Other (See Comments)   Myalgias, muscle weakness, swelling, pain- prescribed Lipitor in 06/2022 (??)        Medication List     STOP taking these medications    aspirin EC 81 MG tablet   atorvastatin 40 MG tablet Commonly known as: LIPITOR   hydrALAZINE 50 MG tablet Commonly known as: APRESOLINE   insulin detemir 100 UNIT/ML injection Commonly known as: LEVEMIR   loperamide 2 MG capsule Commonly known as: IMODIUM       TAKE these  medications    acetaminophen 500 MG tablet Commonly known as: TYLENOL Take 500 mg by mouth every 6 (six) hours as needed for moderate pain.   blood glucose meter kit and supplies Dispense based on patient and insurance preference. Use up to four times daily as directed. (FOR ICD-10 E10.9, E11.9).   cefadroxil 500 MG capsule Commonly known as: DURICEF Take 1 capsule (500 mg total) by mouth 2 (two) times daily for 22 days. What changed: how much to take   cyanocobalamin 500 MCG tablet Commonly known as: VITAMIN B12 Take 500 mcg by mouth daily with breakfast.   doxazosin 2 MG tablet Commonly known as: CARDURA Take 2 mg by mouth daily.   doxycycline 100 MG tablet Commonly known as: VIBRA-TABS Take 1 tablet (100 mg total) by mouth every 12 (twelve) hours. What changed: when to take this   Eliquis 2.5 MG Tabs tablet Generic drug: apixaban Take 2.5 mg by mouth 2 (two) times daily.   ezetimibe 10 MG tablet Commonly known as: ZETIA Take 1 tablet (10 mg total) by mouth daily.   furosemide 40 MG tablet Commonly known as: LASIX Take 1 tablet (40 mg total) by mouth daily.   insulin aspart 100 UNIT/ML FlexPen Commonly known as: NOVOLOG Before each meal 3 times a day, 140-199 - 2 units, 200-250 - 4 units, 251-299 - 6 units,  300-349 - 8 units,  350 or above 10 units. Insulin PEN if approved, provide syringes and needles if needed.Please switch to any approved short acting Insulin if needed.   INSULIN SYRINGE .3CC/31GX5/16" 31G X 5/16" 0.3 ML Misc Use with insulin   isosorbide mononitrate 30 MG 24 hr tablet Commonly known as: IMDUR Take 1 tablet (30 mg total) by mouth daily.   metFORMIN 1000 MG tablet Commonly known as: GLUCOPHAGE Take 1,000 mg by mouth at bedtime.   metoprolol succinate 100 MG 24 hr tablet Commonly known as: TOPROL-XL  Take 1 tablet (100 mg total) by mouth daily. Take with or immediately following a meal. What changed: when to take this   pantoprazole 40  MG tablet Commonly known as: PROTONIX Take 1 tablet (40 mg total) by mouth 2 (two) times daily.   polyethylene glycol 17 g packet Commonly known as: MIRALAX / GLYCOLAX Take 17 g by mouth daily as needed for mild constipation.   pravastatin 40 MG tablet Commonly known as: PRAVACHOL Take 40 mg by mouth at bedtime.   spironolactone 25 MG tablet Commonly known as: ALDACTONE Take 0.5 tablets (12.5 mg total) by mouth daily.   Vitamin D3 50 MCG (2000 UT) capsule Take 2,000 Units by mouth daily.               Discharge Care Instructions  (From admission, onward)           Start     Ordered   07/24/22 0000  Discharge wound care:       Comments: Left leg incision sites clean and dry.  Follow-up with vascular surgeon within a week of discharge.   07/24/22 1040             Contact information for follow-up providers     Martin[place], Peter M, MD. Schedule an appointment as soon as possible for a visit in 1 week(s).   Specialty: Cardiology Contact information: [address] [place] [address] [phone]         Cherre Robins, MD. Schedule an appointment as soon as possible for a visit in 1 week(s).   Specialties: Vascular Surgery, Interventional Cardiology Contact information: [address] [place]  [address] [phone]              Contact information for after-discharge care     Destination     [facility] .   Service: Skilled Nursing Contact information: [address] [place] [phone]                     Major procedures and Radiology Reports - PLEASE review detailed and final reports thoroughly  -      DG Chest [facility] 1 View  Result Date: 07/22/2022 CLINICAL DATA:  81 year old male with history of shortness of breath. EXAM: PORTABLE CHEST 1 VIEW COMPARISON:  Chest x-ray 07/21/2022. FINDINGS: There is cephalization of the pulmonary vasculature and  slight indistinctness of the interstitial markings suggestive of mild pulmonary edema. No definite pleural effusions. No pneumothorax. Heart size is mildly enlarged. Upper mediastinal contours are within normal limits. Atherosclerotic calcifications are noted in the thoracic aorta. Status post median sternotomy for CABG. IMPRESSION: 1. The appearance of the chest is most suggestive of congestive heart failure, as above. Electronically Signed   By: Vinnie Langton M.D.   On: 07/22/2022 06:37   VAS US CAROTID  Result Date: 07/21/2022 Carotid Arterial Duplex Study Patient Name:  Delaney Schnick.  Date of Exam:   07/21/2022 Medical Rec #: [id number]         Accession #:    [id number] Date of Birth: 25-Jun-1941          Patient Gender: M Patient Age:   20 years Exam Location:  [facility] Procedure:      VAS US CAROTID Referring Phys: Elwin Sleight DE LA TORRE --------------------------------------------------------------------------------  Indications:       CVA. Risk Factors:      Hypertension, hyperlipidemia, Diabetes, past history  of                    smoking, coronary artery disease. Comparison Study:  No prior study Performing Technologist: Maudry Mayhew MHA, RDMS, RVT, RDCS  Examination Guidelines: A complete evaluation includes B-mode imaging, spectral Doppler, color Doppler, and power Doppler as needed of all accessible portions of each vessel. Bilateral testing is considered an integral part of a complete examination. Limited examinations for reoccurring indications may be performed as noted.  Right Carotid Findings: +----------+--------+--------+--------+------------------------------+---------+           PSV cm/sEDV cm/sStenosisPlaque Description            Comments  +----------+--------+--------+--------+------------------------------+---------+ CCA Prox  62      10                                                       +----------+--------+--------+--------+------------------------------+---------+ CCA Distal63      8               heterogenous and calcific     Shadowing +----------+--------+--------+--------+------------------------------+---------+ ICA Prox  151     27      1-39%   heterogenous, irregular and   Shadowing                                   calcific                                +----------+--------+--------+--------+------------------------------+---------+ ICA Mid   131     18                                                      +----------+--------+--------+--------+------------------------------+---------+ ICA Distal85      17                                                      +----------+--------+--------+--------+------------------------------+---------+ ECA       144                     heterogenous and irregular              +----------+--------+--------+--------+------------------------------+---------+ +----------+--------+-------+----------------+-------------------+           PSV cm/sEDV cmsDescribe        Arm Pressure (mmHG) +----------+--------+-------+----------------+-------------------+ [id number]            Multiphasic, WNL                    +----------+--------+-------+----------------+-------------------+ +---------+--------+--+--------+-+---------+ VertebralPSV cm/s46EDV cm/s7Antegrade +---------+--------+--+--------+-+---------+  Left Carotid Findings: +----------+--------+--------+--------+------------------------------+---------+           PSV cm/sEDV cm/sStenosisPlaque Description            Comments  +----------+--------+--------+--------+------------------------------+---------+ CCA Prox  64      12                                                      +----------+--------+--------+--------+------------------------------+---------+  CCA Distal76      14              heterogenous and irregular               +----------+--------+--------+--------+------------------------------+---------+ ICA Prox  78      18              heterogenous, irregular and   Shadowing                                   calcific                                +----------+--------+--------+--------+------------------------------+---------+ ICA Distal83      20                                                      +----------+--------+--------+--------+------------------------------+---------+ ECA       108                                                             +----------+--------+--------+--------+------------------------------+---------+ +----------+--------+--------+----------------+-------------------+           PSV cm/sEDV cm/sDescribe        Arm Pressure (mmHG) +----------+--------+--------+----------------+-------------------+ [id number]             Multiphasic, WNL                    +----------+--------+--------+----------------+-------------------+ +---------+--------+--+--------+--+---------+ VertebralPSV cm/s39EDV cm/s12Antegrade +---------+--------+--+--------+--+---------+   Summary: Right Carotid: Velocities in the right ICA are consistent with a 1-39% stenosis. Left Carotid: Velocities in the left ICA are consistent with a 1-39% stenosis. Vertebrals:  Bilateral vertebral arteries demonstrate antegrade flow. Subclavians: Normal flow hemodynamics were seen in bilateral subclavian              arteries. *See table(s) above for measurements and observations.  Electronically signed by Harold Barban MD on 07/21/2022 at 9:07:46 PM.    Final    DG Chest Port 1 View  Result Date: 07/21/2022 CLINICAL DATA:  81 year old male with history of shortness of breath. Weakness. EXAM: PORTABLE CHEST 1 VIEW COMPARISON:  Chest x-ray 07/20/2022. FINDINGS: There is cephalization of the pulmonary vasculature and slight indistinctness of the interstitial markings suggestive of mild pulmonary edema.  No definite pleural effusions. No pneumothorax. Mild cardiomegaly. Upper mediastinal contours are within normal limits. Atherosclerotic calcifications are noted in the thoracic aorta. Status post median sternotomy for CABG. IMPRESSION: 1. The appearance of the chest is most suggestive of congestive heart failure, as above. Electronically Signed   By: Vinnie Langton M.D.   On: 07/21/2022 06:15   US RENAL  Result Date: 07/20/2022 CLINICAL DATA:  Acute renal insufficiency EXAM: RENAL / URINARY TRACT ULTRASOUND COMPLETE COMPARISON:  None Available. FINDINGS: Right Kidney: Renal measurements: 11.4 x 5.3 x 4.7 cm = volume: 145 mL. Echogenicity within normal limits. No mass or hydronephrosis visualized. Left Kidney: Renal measurements: 12.5 x 4.6 x 4.5 cm = volume: 136 mL. Echogenicity within normal limits. No mass or hydronephrosis visualized.  Bladder: Foley catheter balloon seen within a partially decompressed bladder lumen Other: None. IMPRESSION: Normal renal sonogram. Electronically Signed   By: Fidela Salisbury M.D.   On: 07/20/2022 23:25   DG CHEST PORT 1 VIEW  Result Date: 07/20/2022 CLINICAL DATA:  Hyponatremia. EXAM: PORTABLE CHEST 1 VIEW COMPARISON:  Chest x-ray dated July 18, 2022. FINDINGS: Unchanged borderline cardiomegaly status post CABG. New diffuse interstitial thickening. No focal consolidation, pleural effusion, or pneumothorax. No acute osseous abnormality. IMPRESSION: 1. New mild interstitial pulmonary edema. Electronically Signed   By: Titus Dubin M.D.   On: 07/20/2022 11:00   MR ANGIO HEAD WO CONTRAST  Result Date: 07/20/2022 CLINICAL DATA:  81 year old male with altered mental status, TIA, evidence of punctate white matter ischemia in the left hemisphere on MRI 2 days ago. EXAM: MRA HEAD WITHOUT CONTRAST TECHNIQUE: Angiographic images of the Circle of Willis were acquired using MRA technique without intravenous contrast. COMPARISON:  Neck MRA today reported separately. Brain  MRI 07/18/2022. CTA head and neck 05/20/2020. FINDINGS: Anterior circulation: Both ICA siphons are patent but irregular with evidence of calcified atherosclerosis as demonstrated on the prior CTA. Moderate to severe right siphon stenosis suspected in the supraclinoid segment on series 5, image 105. No significant left siphon stenosis. Both carotid termini remain patent. Dominant left and diminutive or absent right ACA A1 segments. No left A1 stenosis. Anterior communicating artery and visible ACA branches are stable and within normal limits. Right MCA M1 segment and bifurcation remain patent. But multifocal Severe right MCA M2 and M3 branch stenoses appear progressed (series 1047, image 16). "Duplicated" left MCA M1 segment again noted. Chronic severe irregularity and stenoses in the region of the left MCA bifurcation appears similar to the 2021 CTA. Left MCA branches appear stable since that time. Posterior circulation: Antegrade flow in the posterior circulation with chronic severe distal right vertebral artery irregularity and stenosis (series 5, image 12). Less pronounced but up to moderate left vertebral V4 segment stenosis. Both distal vertebral arteries, vertebrobasilar junction, and basilar artery remain patent. No significant basilar stenosis. Both PICA origins appear patent.SCA origins are patent. Multifocal mild to moderate left PCA irregularity and stenosis appears stable since the 2021 CTA. But right P1 and P2 segment tandem severe stenoses have progressed since that time (series 1035, image 10). Distal right PCA branches remain patent. Anatomic variants: "Duplicated" left MCA M1 segment. Dominant left and diminutive or absent right ACA A1. Other: No intracranial mass effect or ventriculomegaly. IMPRESSION: 1. Chronic severe intracranial atherosclerosis, stable since a 2021 CTA (see #2) with the following exceptions: - new tandem Severe tandem stenoses of the Right PCA. - new multifocal Severe Right MCA  M2 and M3 branch stenoses. 2. Moderate to severe chronic Left MCA M2 stenoses. Moderate (Left) and severe (Right) distal vertebral artery stenoses. Moderate to severe supraclinoid Right ICA stenosis. Electronically Signed   By: Genevie Ann M.D.   On: 07/20/2022 08:46   MR ANGIO NECK WO CONTRAST  Result Date: 07/20/2022 CLINICAL DATA:  81 year old male with altered mental status, TIA, evidence of punctate white matter ischemia in the left hemisphere on MRI 2 days ago. EXAM: MRA NECK WITHOUT CONTRAST TECHNIQUE: Angiographic images of the neck were acquired using MRA technique without intravenous contrast. Carotid stenosis measurements (when applicable) are obtained utilizing NASCET criteria, using the distal internal carotid diameter as the denominator. COMPARISON:  Brain MRI 07/18/2022.  CTA head and neck 05/20/2020. FINDINGS: Aortic arch: Calcified aortic atherosclerosis. 3 vessel arch configuration demonstrated on  the prior CTA. Right carotid system: Patent with antegrade flow signal to the skull base. Irregularity at the right carotid bifurcation corresponds to bulky atherosclerosis demonstrated by CTA in 2021. Hemodynamically significant stenosis at that time (estimated at 75%). No definite flow gap on MRA today. Left carotid system: Antegrade flow to the skull base. Mild irregularity at the left carotid bifurcation corresponding to atherosclerosis demonstrated on prior CTA. No evidence of hemodynamically significant stenosis. Vertebral arteries: Codominant as on the prior CTA. Antegrade flow signal in both vertebral arteries to the skull base. Both vertebral artery origins were better demonstrated by CTA. Mild to moderate right V2 segment irregularity and stenosis redemonstrated on series 6, image 117. No evidence of left V2 stenosis. Other: None. IMPRESSION: 1. Chronic right carotid bifurcation atherosclerosis with hemodynamically significant proximal Right ICA stenosis (estimated at 75% by CTA in 2021). The  right ICA remains patent. 2. Chronic left carotid atherosclerosis without evidence of hemodynamically significant stenosis. 3. Chronic vertebral artery atherosclerosis, more pronounced on the right and up to moderate in the right V2 segment. Both vertebral arteries remain patent to the skull base. Electronically Signed   By: Genevie Ann M.D.   On: 07/20/2022 08:37   VAS [place] LOWER EXTREMITY ARTERIAL DUPLEX  Result Date: 07/19/2022 LOWER EXTREMITY ARTERIAL DUPLEX STUDY Patient Name:  Sigurd Pugh.  Date of Exam:   07/18/2022 Medical Rec #: [id number]         Accession #:    [id number] Date of Birth: 20-Jan-1941          Patient Gender: M Patient Age:   59 years Exam Location:  [facility] Procedure:      VAS [place] LOWER EXTREMITY ARTERIAL DUPLEX Referring Phys: Jamelle Haring --------------------------------------------------------------------------------  Indications: Cellulitis about left calf incision. High Risk Factors: Hypertension, hyperlipidemia, Diabetes.  Current ABI: Limitations: Incision, staples Comparison Study: No prior studies. Performing Technologist: Oliver Hum RVT  Examination Guidelines: A complete evaluation includes B-mode imaging, spectral Doppler, color Doppler, and power Doppler as needed of all accessible portions of each vessel. Bilateral testing is considered an integral part of a complete examination. Limited examinations for reoccurring indications may be performed as noted.   Left Graft #1: +--------------------+--------+--------+--------+--------+                     PSV cm/sStenosisWaveformComments +--------------------+--------+--------+--------+--------+ Inflow                                               +--------------------+--------+--------+--------+--------+ Proximal Anastomosis                                 +--------------------+--------+--------+--------+--------+ Proximal Graft                                        +--------------------+--------+--------+--------+--------+ Mid Graft                                            +--------------------+--------+--------+--------+--------+ Distal Graft        55                               +--------------------+--------+--------+--------+--------+  Distal Anastomosis  192                              +--------------------+--------+--------+--------+--------+ Outflow                                              +--------------------+--------+--------+--------+--------+ There is an area of mixed echogenicity adjacent to the bypass graft in the mid calf.  Summary: Left: Widely patent graft. There is an area of mixed echogenicity adjacent to the bypass graft in the mid calf.  See table(s) above for measurements and observations. Electronically signed by Jamelle Haring on 07/19/2022 at 11:06:40 AM.    Final    MR BRAIN WO CONTRAST  Result Date: 07/18/2022 CLINICAL DATA:  Initial evaluation for acute TIA. EXAM: MRI HEAD WITHOUT CONTRAST TECHNIQUE: Multiplanar, multiecho pulse sequences of the brain and surrounding structures were obtained without intravenous contrast. COMPARISON:  Prior CT from earlier the same day. FINDINGS: Brain: Examination degraded by motion artifact. Diffuse prominence of the CSF containing spaces compatible generalized cerebral atrophy. Mild for age chronic microvascular ischemic disease noted involving the periventricular white matter. Few small remote lacunar infarcts noted about the bilateral basal ganglia and cerebellum. Two distinct punctate foci of restricted diffusion are seen involving the white matter of the left corona radiata (series 5, images 29, 32). Associated signal loss on ADC map (series 6, image 29). Findings consistent with small acute ischemic infarcts. No associated hemorrhage or mass effect. No other evidence for acute or subacute ischemia. Gray-white matter differentiation otherwise maintained. No areas of chronic  cortical infarction. No other acute or chronic intracranial blood products. No mass lesion, midline shift or mass effect. No hydrocephalus or extra-axial fluid collection. Pituitary gland and suprasellar region within normal limits. Vascular: Major intracranial vascular flow voids are maintained. Skull and upper cervical spine: Craniocervical junction within normal limits. Bone marrow signal intensity grossly normal. No scalp soft tissue abnormality. Sinuses/Orbits: Sequelae of prior ocular lens replacement on the right. Susceptibility artifact emanates from the lateral aspect of the left orbit. Mild scattered mucosal thickening noted about the ethmoidal air cells and maxillary sinuses. Small bilateral mastoid effusions noted, of doubtful significance. Negative nasopharynx. Other: None. IMPRESSION: 1. Two distinct punctate foci of restricted diffusion involving the white matter of the left corona radiata, consistent with small acute ischemic infarcts. No associated hemorrhage or mass effect. 2. No other acute intracranial abnormality. 3. Underlying age-related cerebral atrophy with mild chronic small vessel ischemic disease. Electronically Signed   By: Jeannine Boga M.D.   On: 07/18/2022 19:43   CT HEAD WO CONTRAST  Result Date: 07/18/2022 CLINICAL DATA:  Mental status change, unknown cause EXAM: CT HEAD WITHOUT CONTRAST TECHNIQUE: Contiguous axial images were obtained from the base of the skull through the vertex without intravenous contrast. RADIATION DOSE REDUCTION: This exam was performed according to the departmental dose-optimization program which includes automated exposure control, adjustment of the mA and/or kV according to patient size and/or use of iterative reconstruction technique. COMPARISON:  05/20/2020 FINDINGS: Brain: No evidence of acute infarction, hemorrhage, hydrocephalus, extra-axial collection or mass lesion/mass effect. Scattered low-density changes within the periventricular and  subcortical white matter compatible with chronic microvascular ischemic change. Mild diffuse cerebral volume loss. Vascular: Atherosclerotic calcifications involving the large vessels of the skull base. No unexpected hyperdense vessel. Skull: Normal. Negative  for fracture or focal lesion. Sinuses/Orbits: Mild mucosal thickening within the paranasal sinuses. Other: None. IMPRESSION: 1. No acute intracranial abnormality. 2. Chronic microvascular ischemic change and cerebral volume loss. Electronically Signed   By: Davina Poke D.O.   On: 07/18/2022 13:43   DG Chest 2 View  Result Date: 07/18/2022 CLINICAL DATA:  Suspected sepsis. EXAM: CHEST - 2 VIEW COMPARISON:  January 08, 2022 FINDINGS: The patient is status post CABG. Heart size is mildly enlarged but stable. The hila and mediastinum are normal. No pneumothorax. No nodules or masses. No focal infiltrates. IMPRESSION: No cause for sepsis identified. Electronically Signed   By: Dorise Bullion III M.D.   On: 07/18/2022 13:27   DG Foot Complete Left  Result Date: 07/09/2022 CLINICAL DATA:  Postop amputation fifth digit EXAM: LEFT FOOT - COMPLETE 3+ VIEW COMPARISON:  06/26/2022 FINDINGS: Frontal, oblique, and lateral views of the left foot are obtained. 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 Postsurgical changes are seen within the overlying soft tissues. No other acute bony abnormalities. IMPRESSION: 1. Postoperative changes from amputation at the level of the mid fifth metatarsal. Electronically Signed   By: Randa Ngo M.D.   On: 07/09/2022 19:46   ECHOCARDIOGRAM COMPLETE  Result Date: 07/05/2022    ECHOCARDIOGRAM REPORT   Patient Name:   Sirius Woodford. Date of Exam: 07/05/2022 Medical Rec #:  [id number]        Height:       69.0 in Accession #:    [id number]       Weight:       193.6 lb Date of Birth:  07-28-1941         BSA:          2.037 m Patient Age:    47 years         BP:           142/50 mmHg  Patient Gender: M                HR:           60 bpm. Exam Location:  Inpatient Procedure: 2D Echo, Cardiac Doppler and Color Doppler Indications:    Preoperative evaluation  History:        Patient has prior history of Echocardiogram examinations, most                 recent 12/15/2018. Cardiomyopathy, CAD, Prior CABG,                 Arrythmias:RBBB and Atrial Fibrillation; Risk Factors:Diabetes,                 Dyslipidemia and Hypertension.  Sonographer:    Eartha Inch Referring Phys: Reino Bellis, B  Sonographer Comments: Image acquisition challenging due to patient body habitus. IMPRESSIONS  1. Left ventricular ejection fraction, by estimation, is 35 to 40%. The left ventricle has moderately decreased function. The left ventricle demonstrates global hypokinesis. The left ventricular internal cavity size was mildly dilated. Left ventricular diastolic parameters are consistent with Grade II diastolic dysfunction (pseudonormalization). Elevated left atrial pressure.  2. Right ventricular systolic function is normal. The right ventricular size is normal. There is normal pulmonary artery systolic pressure. The estimated right ventricular systolic pressure is 06.2 mmHg.  3. Left atrial size was moderately dilated.  4. Secondary ventricular functional mitral regurgitation with regurgitant volume 49 mL. Systolic blunting of the right sided pulmonary vein systolic flow. The  mitral valve is normal in structure. Moderate mitral valve regurgitation. No evidence of mitral stenosis.  5. The aortic valve is tricuspid. Aortic valve regurgitation is not visualized. No aortic stenosis is present. Comparison(s): Prior images reviewed side by side. Slight increase in mitral regurgitation. FINDINGS  Left Ventricle: Left ventricular ejection fraction, by estimation, is 35 to 40%. The left ventricle has moderately decreased function. The left ventricle demonstrates global hypokinesis. The left ventricular internal cavity  size was mildly dilated. There is no left ventricular hypertrophy. Left ventricular diastolic parameters are consistent with Grade II diastolic dysfunction (pseudonormalization). Elevated left atrial pressure. Right Ventricle: The right ventricular size is normal. No increase in right ventricular wall thickness. Right ventricular systolic function is normal. There is normal pulmonary artery systolic pressure. The tricuspid regurgitant velocity is 2.23 m/s, and  with an assumed right atrial pressure of 3 mmHg, the estimated right ventricular systolic pressure is 41.7 mmHg. Left Atrium: Left atrial size was moderately dilated. Right Atrium: Right atrial size was normal in size. Pericardium: There is no evidence of pericardial effusion. Mitral Valve: Secondary ventricular functional mitral regurgitation with regurgitant volume 49 mL. Systolic blunting of the right sided pulmonary vein systolic flow. The mitral valve is normal in structure. Moderate mitral valve regurgitation. No evidence of mitral valve stenosis. Tricuspid Valve: The tricuspid valve is normal in structure. Tricuspid valve regurgitation is mild . No evidence of tricuspid stenosis. Aortic Valve: The aortic valve is tricuspid. Aortic valve regurgitation is not visualized. No aortic stenosis is present. Pulmonic Valve: The pulmonic valve was normal in structure. Pulmonic valve regurgitation is mild. No evidence of pulmonic stenosis. Aorta: The aortic root and ascending aorta are structurally normal, with no evidence of dilitation. IAS/Shunts: No atrial level shunt detected by color flow Doppler.  LEFT VENTRICLE PLAX 2D LVIDd:         6.00 cm      Diastology LVIDs:         4.90 cm      LV e' medial:    5.22 cm/s LV PW:         1.00 cm      LV E/e' medial:  21.6 LV IVS:        1.00 cm      LV e' lateral:   3.57 cm/s LVOT diam:     2.40 cm      LV E/e' lateral: 31.7 LV SV:         124 LV SV Index:   61 LVOT Area:     4.52 cm  LV Volumes (MOD) LV vol d, MOD  A2C: 155.0 ml LV vol d, MOD A4C: 168.0 ml LV vol s, MOD A2C: 96.0 ml LV vol s, MOD A4C: 111.0 ml LV SV MOD A2C:     59.0 ml LV SV MOD A4C:     168.0 ml LV SV MOD BP:      58.5 ml RIGHT VENTRICLE RV S prime:     9.48 cm/s TAPSE (M-mode): 1.6 cm LEFT ATRIUM             Index        RIGHT ATRIUM           Index LA diam:        5.30 cm 2.60 cm/m   RA Area:     13.50 cm LA Vol (A2C):   77.5 ml 38.04 ml/m  RA Volume:   29.10 ml  14.28 ml/m LA Vol (A4C):   82.4  ml 40.44 ml/m LA Biplane Vol: 80.6 ml 39.56 ml/m  AORTIC VALVE LVOT Vmax:   105.00 cm/s LVOT Vmean:  76.100 cm/s LVOT VTI:    0.275 m  AORTA Ao Root diam: 3.30 cm Ao Asc diam:  3.90 cm MITRAL VALVE                  TRICUSPID VALVE MV Area (PHT): 3.50 cm       TR Peak grad:   19.9 mmHg MV Decel Time: 217 msec       TR Vmax:        223.00 cm/s MR Peak grad:    110.2 mmHg MR Mean grad:    74.0 mmHg    SHUNTS MR Vmax:         525.00 cm/s  Systemic VTI:  0.28 m MR Vmean:        402.0 cm/s   Systemic Diam: 2.40 cm MR PISA:         3.08 cm MR PISA Eff ROA: 22 mm MR PISA Radius:  0.70 cm MV E velocity: 113.00 cm/s MV A velocity: 84.60 cm/s MV E/A ratio:  1.34 Rudean Haskell MD Electronically signed by Rudean Haskell MD Signature Date/Time: 07/05/2022/2:35:17 PM    Final    DG Ankle Right Port  Result Date: 07/04/2022 CLINICAL DATA:  Right ankle pain EXAM: PORTABLE RIGHT ANKLE - 2 VIEW COMPARISON:  12/01/2021 FINDINGS: There is no evidence of fracture, dislocation, or joint effusion. There is no evidence of arthropathy or other focal bone abnormality. No focal soft tissue swelling. Advanced atherosclerotic vascular calcifications. IMPRESSION: Negative. Electronically Signed   By: Davina Poke D.O.   On: 07/04/2022 13:08   VAS [place] LOWER EXTREMITY SAPHENOUS VEIN MAPPING  Result Date: 07/03/2022 LOWER EXTREMITY VEIN MAPPING Patient Name:  Nickolaus Bordelon.  Date of Exam:   07/03/2022 Medical Rec #: [id number]         Accession #:    [id number] Date of  Birth: 30-May-1941          Patient Gender: M Patient Age:   47 years Exam Location:  [facility] Procedure:      VAS [place] LOWER EXTREMITY SAPHENOUS VEIN MAPPING Referring Phys: Jamelle Haring --------------------------------------------------------------------------------  Indications:  Pre operative Risk Factors: PAD.  Comparison Study: No prior Performing Technologist: Sharion Dove RVS  Examination Guidelines: A complete evaluation includes B-mode imaging, spectral Doppler, color Doppler, and power Doppler as needed of all accessible portions of each vessel. Bilateral testing is considered an integral part of a complete examination. Limited examinations for reoccurring indications may be performed as noted. +---------------+-----------+----------------------+---------------+-----------+   RT Diameter  RT Findings         GSV            LT Diameter  LT Findings      (cm)                                            (cm)                  +---------------+-----------+----------------------+---------------+-----------+      0.48                     Saphenofemoral         0.39  Junction                                  +---------------+-----------+----------------------+---------------+-----------+      0.49                     Proximal thigh         0.18                  +---------------+-----------+----------------------+---------------+-----------+      0.46                       Mid thigh          0.21/0.18    branching  +---------------+-----------+----------------------+---------------+-----------+      0.44                      Distal thigh          0.19                  +---------------+-----------+----------------------+---------------+-----------+      0.40                          Knee              0.19                  +---------------+-----------+----------------------+---------------+-----------+      0.36                        Prox calf            0.18                  +---------------+-----------+----------------------+---------------+-----------+      0.36                        Mid calf            0.20                  +---------------+-----------+----------------------+---------------+-----------+      0.29                      Distal calf           0.28                  +---------------+-----------+----------------------+---------------+-----------+      0.26                         Ankle              0.20       branching  +---------------+-----------+----------------------+---------------+-----------+ Diagnosing physician: Orlie Pollen Electronically signed by Orlie Pollen on 07/03/2022 at 10:43:33 AM.    Final    PERIPHERAL VASCULAR CATHETERIZATION  Result Date: 07/02/2022 PATIENT:  [name]  81 y.o. male  PRE-OPERATIVE DIAGNOSIS:  Atherosclerosis of native arteries of left lower extremity causing ulceration  POST-OPERATIVE DIAGNOSIS:  Same  PROCEDURE:  1) Ultrasound guided right common femoral artery access 2) Aortogram 3) Left lower extremity angiogram with second order cannulation (38m total contrast) 4) Conscious sedation (24 minutes)  SURGEON:  TYevonne Aline HStanford Breed MD  ASSISTANT: none  ANESTHESIA:   local and IV sedation  ESTIMATED BLOOD LOSS:  minimal  LOCAL MEDICATIONS USED:  LIDOCAINE  COUNTS: confirmed correct.  PATIENT DISPOSITION:  PACU - hemodynamically stable.  Delay start of Pharmacological VTE agent (>24hrs) due to surgical blood loss or risk of bleeding: no  INDICATION FOR PROCEDURE: Guilherme Schwenke. is a 81 y.o. male with left foot ischemic ulceration. After careful discussion of risks, benefits, and alternatives the patient was offered angiography. The patient understood and wished to proceed.  OPERATIVE FINDINGS: Terminal aorta and iliac arteries: Tortuous, heavily calcified. No flow limiting stenosis.  Left lower extremity: Common femoral artery: Heavily  calcified. Moderate distal stenosis prior to bifurcation (~50%). Profunda femoris artery: patent without stenosis Superficial femoral artery: heavily calcified. Occludes at [place]'s canal. Popliteal artery: Occluded. A small island of below knee popliteal artery reconstitutes and then occludes. Anterior tibial artery: occluded Tibioperoneal trunk: occluded Peroneal artery: reconstitutes in proximal calf. Flows to ankle and gives off collateralization to fill the foot Posterior tibial artery: occluded Pedal circulation: disadvantaged.  GLASS score. FP: 4. IP: 3. Stage III disease.  WIfI score. 1 / 3 / 0. Moderate amputation risk. High benefit from revascularization.  DESCRIPTION OF PROCEDURE: After identification of the patient in the pre-operative holding area, the patient was transferred to the operating room. The patient was positioned supine on the operating room table. Anesthesia was induced. The groins was prepped and draped in standard fashion. A surgical pause was performed confirming correct patient, procedure, and operative location.  The right groin was anesthetized with subcutaneous injection of 1% lidocaine. Using ultrasound guidance, the right common femoral artery was accessed with micropuncture technique. Fluoroscopy was used to confirm cannulation over the femoral head. The 460F sheath was upsized to 60F.  A Benson wire was advanced into the distal aorta. Over the wire an omni flush catheter was advanced to the level of L2. Aortogram was performed - see above for details.  The left common iliac artery was selected with an omniflush catheter and glidewire guidewire. The wire was advanced into the common femoral artery. Over the wire the omni flush catheter was advanced into the external iliac artery. Selective angiography was performed - see above for details.  The sheath was left in place to be removed in the holding area.  Conscious sedation was administered with the use of IV fentanyl and midazolam  under continuous physician and nurse monitoring.  Heart rate, blood pressure, and oxygen saturation were continuously monitored.  Total sedation time was 24 minutes  Upon completion of the case instrument and sharps counts were confirmed correct. The patient was transferred to the PACU in good condition. I was present for all portions of the procedure.  PLAN: ASA 42m PO QD. High intensity statin therapy. Resume anticoagulation 8 hours post sheath pull. Only surgical option is common femoral to peroneal artery bypass. He may not tolerate this given his age and comorbidity. Will discuss with Dr. DScot Dock Will check vein mapping.  TYevonne Aline HStanford Breed MD [facility] PLLCPhone Number: [phone]/05/2022 12:29 PM   VAS [place] WITH/WO TBI  Result Date: 06/30/2022  LOWER EXTREMITY DOPPLER STUDY Patient Name:  Tayari C Bordas JR.  Date of Exam:   06/30/2022 Medical Rec #: [id number]        Accession #:    [id number] of Birth: 60Feb 16, 1942         Patient Gender: M Patient Age:   89years [facility] [place] ABI WITH/WO TBI Referring Phys: RALPH NETTEY --------------------------------------------------------------------------------  Indications: Peripheral artery disease. High Risk Factors: Hypertension, hyperlipidemia, coronary artery disease.  Comparison Study: 01/07/2022 ABI/TBI- Right= [place]/0.30, left=0.57/0.20 Performing Technologist: Maudry Mayhew MHA, RVT, RDCS, RDMS  Examination Guidelines: A complete evaluation includes at minimum, Doppler waveform signals and systolic blood pressure reading at the level of bilateral brachial, anterior tibial, and posterior tibial arteries, when vessel segments are accessible. Bilateral testing is considered an integral part of a complete examination. Photoelectric Plethysmograph (PPG) waveforms and toe systolic pressure readings are included as required and additional duplex testing as needed. Limited  examinations for reoccurring indications may be performed as noted.  ABI Findings: +---------+------------------+-----+----------+--------+ Right    Rt Pressure (mmHg)IndexWaveform  Comment  +---------+------------------+-----+----------+--------+ Brachial 144                    triphasic          +---------+------------------+-----+----------+--------+ PTA      255               1.31 monophasic         +---------+------------------+-----+----------+--------+ DP       255               1.31 monophasic         +---------+------------------+-----+----------+--------+ Great Toe58                0.30                    +---------+------------------+-----+----------+--------+ +---------+------------------+-----+----------+-------+ Left     Lt Pressure (mmHg)IndexWaveform  Comment +---------+------------------+-----+----------+-------+ Brachial 195                                      +---------+------------------+-----+----------+-------+ PTA      43                0.22 monophasic        +---------+------------------+-----+----------+-------+ DP       74                0.38 monophasic        +---------+------------------+-----+----------+-------+ Great Toe19                0.10                   +---------+------------------+-----+----------+-------+ +-------+-----------+-----------+------------+------------+ ABI/TBIToday's ABIToday's TBIPrevious ABIPrevious TBI +-------+-----------+-----------+------------+------------+ Right  [place]         0.30       [place]          0.30         +-------+-----------+-----------+------------+------------+ Left   0.38       0.10       0.57        0.20         +-------+-----------+-----------+------------+------------+  Summary: Right: Resting right ankle-brachial index indicates noncompressible right lower extremity arteries. The right toe-brachial index is abnormal. Left: Resting left ankle-brachial index indicates  severe left lower extremity arterial disease. The left toe-brachial index is abnormal. *See table(s) above for measurements and observations.  Electronically signed by Harold Barban MD on 06/30/2022 at 10:31:11 PM.    Final    MR FOOT LEFT WO CONTRAST  Result Date: 06/27/2022 CLINICAL DATA:  Foot swelling, diabetic, osteomyelitis suspected, xray done EXAM: MRI OF THE LEFT FOOT WITHOUT CONTRAST TECHNIQUE: Multiplanar, multisequence MR imaging of the left forefoot was performed.  No intravenous contrast was administered. COMPARISON:  Left radiograph 06/26/2022 FINDINGS: Bones/Joint/Cartilage There is marrow edema and low T1 signal within the distal fifth metatarsal head/neck. There is marrow edema and preserved T1 signal in the fifth toe proximal phalanx. Moderate first MTP osteoarthritis. Additional scattered marrow edema which is likely reactive. Ligaments Intact Lisfranc ligament. Muscles and Tendons Diffuse intramuscular edema and atrophy in the foot as is commonly seen in diabetics. Soft tissues There is a lateral forefoot soft tissue ulcer adjacent to the fifth metatarsal head. There is no well-defined/drainable fluid collection. There is mild generalized soft tissue swelling of the foot. 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 No evidence of soft tissue abscess. Electronically Signed   By: Maurine Simmering M.D.   On: 06/27/2022 14:06   DG Foot Complete Left  Result Date: 06/26/2022 CLINICAL DATA:  Diabetic foot ulcer. EXAM: LEFT FOOT - COMPLETE 3+ VIEW COMPARISON:  None Available. FINDINGS: No fracture. No bone lesion. There is no bone resorption to suggest osteomyelitis. Joints are normally aligned. Soft tissue air projects along the lateral plantar aspect of the forefoot adjacent to the fifth metatarsal head consistent with a diabetic ulcer. IMPRESSION: 1. No fracture.  No evidence  of osteomyelitis. Electronically Signed   By: Lajean Manes M.D.   On: 06/26/2022 12:41     Today   Subjective    [name] today has no headache,no chest abdominal pain,no new weakness tingling or numbness, feels much better   Objective   Blood pressure 139/60, pulse 67, temperature 97.8 F (36.6 C), temperature source Oral, resp. rate 17, height $RemoveBe'5\' 9"'nyBDaftcI$  (1.753 m), weight 76.9 kg, SpO2 99 %.   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 with staples - sutures and stable,  resolved surrounding cellulitis,   Data Review   Recent Labs  Lab 07/20/22 0329 07/20/22 2207 07/21/22 0442 07/22/22 0505 07/23/22 0419 07/24/22 0438  WBC 7.8  --  6.2 5.8 5.5 5.0  HGB 7.6* 7.1* 7.3* 8.9* 10.2* 10.2*  HCT 21.8* 20.3* 21.2* 25.9* 29.8* 30.3*  PLT 95*  --  99* 110* 147* 153  MCV 88.6  --  89.8 89.6 89.8 90.7  MCH 30.9  --  30.9 30.8 30.7 30.5  MCHC 34.9  --  34.4 34.4 34.2 33.7  RDW 16.2*  --  16.3* 15.5 15.3 15.7*  LYMPHSABS 0.8  --  1.1 1.2 0.9 1.0  MONOABS 0.3  --  0.3 0.4 0.3 0.4  EOSABS 0.2  --  0.2 0.2 0.0 0.1  BASOSABS 0.0  --  0.0 0.0 0.2* 0.1    Recent Labs  Lab 07/20/22 0329 07/21/22 0442 07/21/22 1053 07/22/22 0505 07/23/22 0419 07/24/22 0438  NA 126* 130*  --  132* 133* 133*  K 3.6 3.4*  --  3.8 3.6 4.2  CL 101 102  --  98 98 97*  CO2 16* 18*  --  19* 23 21*  GLUCOSE 153* 132*  --  136* 134* 159*  BUN 36* 29*  --  25* 21 21  CREATININE 2.30* 2.04*  --  1.80* 1.71* 1.82*  CALCIUM 7.5* 7.9*  --  8.8* 8.7* 9.3  AST  --  27  --   --   --   --   ALT  --  22  --   --   --   --    ALKPHOS  --  49  --   --   --   --   BILITOT  --  0.9  --   --   --   --   ALBUMIN  --  2.2*  --   --   --   --   MG  --  1.7  --  1.9 1.4* 2.2  PHOS  --  3.7  --   --   --   --   CRP  --   --  3.8* 2.5* 1.6* 0.8  TSH 2.527  --   --   --   --   --   BNP  --   --   --  1,916.7* 1,239.2* 534.5*    Total Time in preparing paper work, data evaluation and todays exam - 35 minutes  Lala Lund M.D on 07/26/2022 at 10:12 AM  [facility]

## 2022-07-24 NOTE — Plan of Care (Signed)
Problem: Fluid Volume: Goal: Hemodynamic stability will improve Outcome: Progressing   Problem: Clinical Measurements: Goal: Diagnostic test results will improve Outcome: Progressing Goal: Signs and symptoms of infection will decrease Outcome: Progressing   Problem: Respiratory: Goal: Ability to maintain adequate ventilation will improve Outcome: Progressing   Problem: Education: Goal: Understanding of CV disease, CV risk reduction, and recovery process will improve Outcome: Progressing Goal: Individualized Educational Video(s) Outcome: Progressing   Problem: Activity: Goal: Ability to return to baseline activity level will improve Outcome: Progressing   Problem: Cardiovascular: Goal: Ability to achieve and maintain adequate cardiovascular perfusion will improve Outcome: Progressing Goal: Vascular access site(s) Level 0-1 will be maintained Outcome: Progressing   Problem: Health Behavior/Discharge Planning: Goal: Ability to safely manage health-related needs after discharge will improve Outcome: Progressing   Problem: Education: Goal: Knowledge of General Education information will improve Description: Including pain rating scale, medication(s)/side effects and non-pharmacologic comfort measures Outcome: Progressing   Problem: Health Behavior/Discharge Planning: Goal: Ability to manage health-related needs will improve Outcome: Progressing   Problem: Clinical Measurements: Goal: Ability to maintain clinical measurements within normal limits will improve Outcome: Progressing Goal: Will remain free from infection Outcome: Progressing Goal: Diagnostic test results will improve Outcome: Progressing Goal: Respiratory complications will improve Outcome: Progressing Goal: Cardiovascular complication will be avoided Outcome: Progressing   Problem: Activity: Goal: Risk for activity intolerance will decrease Outcome: Progressing   Problem: Nutrition: Goal: Adequate  nutrition will be maintained Outcome: Progressing   Problem: Coping: Goal: Level of anxiety will decrease Outcome: Progressing   Problem: Elimination: Goal: Will not experience complications related to bowel motility Outcome: Progressing Goal: Will not experience complications related to urinary retention Outcome: Progressing   Problem: Pain Managment: Goal: General experience of comfort will improve Outcome: Progressing   Problem: Safety: Goal: Ability to remain free from injury will improve Outcome: Progressing   Problem: Skin Integrity: Goal: Risk for impaired skin integrity will decrease Outcome: Progressing   Problem: Clinical Measurements: Goal: Ability to avoid or minimize complications of infection will improve Outcome: Progressing   Problem: Skin Integrity: Goal: Skin integrity will improve Outcome: Progressing   Problem: Education: Goal: Knowledge of disease or condition will improve Outcome: Progressing Goal: Knowledge of secondary prevention will improve (SELECT ALL) Outcome: Progressing Goal: Knowledge of patient specific risk factors will improve (INDIVIDUALIZE FOR PATIENT) Outcome: Progressing Goal: Individualized Educational Video(s) Outcome: Progressing   Problem: Coping: Goal: Will verbalize positive feelings about self Outcome: Progressing Goal: Will identify appropriate support needs Outcome: Progressing   Problem: Health Behavior/Discharge Planning: Goal: Ability to manage health-related needs will improve Outcome: Progressing   Problem: Self-Care: Goal: Ability to participate in self-care as condition permits will improve Outcome: Progressing Goal: Verbalization of feelings and concerns over difficulty with self-care will improve Outcome: Progressing Goal: Ability to communicate needs accurately will improve Outcome: Progressing   Problem: Nutrition: Goal: Risk of aspiration will decrease Outcome: Progressing Goal: Dietary intake  will improve Outcome: Progressing   Problem: Intracerebral Hemorrhage Tissue Perfusion: Goal: Complications of Intracerebral Hemorrhage will be minimized Outcome: Progressing   Problem: Ischemic Stroke/TIA Tissue Perfusion: Goal: Complications of ischemic stroke/TIA will be minimized Outcome: Progressing   Problem: Spontaneous Subarachnoid Hemorrhage Tissue Perfusion: Goal: Complications of Spontaneous Subarachnoid Hemorrhage will be minimized Outcome: Progressing   Problem: Education: Goal: Ability to describe self-care measures that may prevent or decrease complications (Diabetes Survival Skills Education) will improve Outcome: Progressing Goal: Individualized Educational Video(s) Outcome: Progressing   Problem: Coping: Goal: Ability to adjust to condition or  change in health will improve Outcome: Progressing   Problem: Fluid Volume: Goal: Ability to maintain a balanced intake and output will improve Outcome: Progressing

## 2022-07-24 NOTE — Progress Notes (Signed)
Discharge paperwork reviewed with patient at this time. No complaints of pain have been made. [facility] on Sunday.

## 2022-07-25 DIAGNOSIS — L03116 Cellulitis of left lower limb: Secondary | ICD-10-CM | POA: Diagnosis not present

## 2022-07-25 LAB — GLUCOSE, CAPILLARY
Glucose-Capillary: 143 mg/dL — ABNORMAL HIGH (ref 70–99)
Glucose-Capillary: 121 mg/dL — ABNORMAL HIGH (ref 70–99)
Glucose-Capillary: 132 mg/dL — ABNORMAL HIGH (ref 70–99)

## 2022-07-25 NOTE — TOC Progression Note (Addendum)
Transition of Care (TOC) - Initial/Assessment Note    Patient Details  Name: Steve [name]. MRN: [id number] Date of Birth: Aug 14, 1941  Transition of Care [facility]) CM/SW Contact:    Milinda Antis, [place] Phone Number: 07/25/2022, 10:47 AM  Clinical Narrative:                 CSW contacted admissions at [facility] to inquire about the patient admitting today and is awaiting a response.    13:56-  CSW spoke with Freda Munro in admissions at [facility].  The facility can accept the patient tomorrow morning.  TOC will continue to follow.    Expected Discharge Plan: Skilled Nursing Facility Barriers to Discharge: SNF Pending bed offer, Continued Medical Work up   Patient Goals and CMS Choice Patient states their goals for this hospitalization and ongoing recovery are:: Rehab and then move into ALF CMS Medicare.gov Compare Post Acute Care list provided to:: Patient Choice offered to / list presented to : Patient  Expected Discharge Plan and Services Expected Discharge Plan: [place] In-house Referral: Clinical Social Work   Post Acute Care Choice: [place] Living arrangements for the past 2 months: [place] Expected Discharge Date: 07/24/22                                    Prior Living Arrangements/Services Living arrangements for the past 2 months: [place] Lives with:: Facility Resident Patient language and need for interpreter reviewed:: Yes Do you feel safe going back to the place where you live?: Yes      Need for Family Participation in Patient Care: Yes (Comment) Care giver support system in place?: Yes (comment)   Criminal Activity/Legal Involvement Pertinent to Current Situation/Hospitalization: No - Comment as needed  Activities of Daily Living Home Assistive Devices/Equipment: Environmental consultant (specify type), Eyeglasses, CBG Meter, Built-in shower seat ADL Screening (condition at time of  admission) Patient's cognitive ability adequate to safely complete daily activities?: Yes Is the patient deaf or have difficulty hearing?: No Does the patient have difficulty seeing, even when wearing glasses/contacts?: No Does the patient have difficulty concentrating, remembering, or making decisions?: No Patient able to express need for assistance with ADLs?: Yes Does the patient have difficulty dressing or bathing?: No Independently performs ADLs?: Yes (appropriate for developmental age) Does the patient have difficulty walking or climbing stairs?: Yes Weakness of Legs: Both Weakness of Arms/Hands: None  Permission Sought/Granted Permission sought to share information with : Facility Sport and exercise psychologist, Family Supports Permission granted to share information with : Yes, Verbal Permission Granted  Share Information with NAME: [name]  Permission granted to share info w AGENCY: SNFs  Permission granted to share info w Relationship: Son  Permission granted to share info w Contact Information: [phone]  Emotional Assessment Appearance:: Appears stated age Attitude/Demeanor/Rapport: Engaged, Gracious Affect (typically observed): Accepting, Appropriate, Pleasant Orientation: : Oriented to Self, Oriented to Place, Oriented to  Time, Oriented to Situation Alcohol / Substance Use: Not Applicable Psych Involvement: No (comment)  Admission diagnosis:  Transient alteration of awareness [R40.4] Melena [K92.1] Acute cystitis with hematuria [N30.01] Cellulitis of left lower extremity [L03.116] AKI (acute kidney injury) ([place]) [N17.9] Sepsis, due to unspecified organism, unspecified whether acute organ dysfunction present [facility]) [A41.9] Patient Active Problem List   Diagnosis Date Noted   Cellulitis of left lower extremity 07/18/2022   Presbyopia 07/18/2022   Acute blood loss  anemia 07/18/2022   Moderate protein malnutrition ([place]) 07/18/2022   GI bleed 07/18/2022   Hypoglycemia  07/18/2022   Chronic combined systolic and diastolic CHF (congestive heart failure) ([place]) 07/08/2022   Prolonged QT interval 07/03/2022   Sepsis due to undetermined organism ([place]) 06/25/2022   Dehydration with hyponatremia 06/25/2022   DKA (diabetic ketoacidoses) 04/16/2019   Atrial fibrillation ([place]) 12/15/2018   CKD (chronic kidney disease), symptom management only, stage 4 (severe) ([place]) 09/08/2018   Sepsis ([place]) 09/07/2018   Diabetic ulcer of toe of left foot associated with type 2 diabetes mellitus ([place])    Cellulitis of left anterior lower leg 07/25/2018   Iron deficiency anemia 07/24/2018   Acute kidney injury superimposed on chronic kidney disease ([place]) 07/24/2018   Hyponatremia 07/24/2018   Hypokalemia 45/36/4680   Metabolic acidosis 32/09/2481   Acute renal failure (ARF) ([place]) 07/24/2018   Normocytic anemia 07/24/2018   Thrombocytopenia ([place]) 07/24/2018   Ischemic cardiomyopathy    CAD (coronary artery disease)    PAF (paroxysmal atrial fibrillation) ([place])    Essential hypertension    Type II diabetes mellitus ([place])    Near syncope 04/01/2015   Dizziness and giddiness 04/01/2015   Fatigue 04/01/2015   Chronic renal disease, stage II 04/01/2015   RBBB 04/01/2015   Angina pectoris ([place]) 04/01/2015   PAT (paroxysmal atrial tachycardia) ([place]) 12/16/2011   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 ([address]), [place] [address] [place] ([place]) [place] [address] Phone: [phone] Fax: [phone]     Social Determinants of Health (SDOH) Interventions    Readmission Risk Interventions    07/22/2022   11:13 AM 07/13/2022   10:22 AM  Readmission Risk Prevention Plan  Transportation Screening Complete Complete  PCP or Specialist Appt within 3-5 Days  Complete  HRI or  Home Care Consult  Complete  Social Work Consult for [place] Planning/Counseling  Complete  Palliative Care Screening  Not Applicable  Medication Review Press photographer) Complete Complete  PCP or Specialist appointment within 3-5 days of discharge Complete   HRI or Home Care Consult Complete   SW Recovery Care/Counseling Consult Complete   Palliative Care Screening Not Applicable   Skilled Nursing Facility Complete

## 2022-07-25 NOTE — Progress Notes (Signed)
[facility]                                                                                                                                                                         Patient Demographics  Steve [name], is a 81 y.o. male  [id number]  [id number]  DOB - 1941-01-17  Admit date - 07/18/2022  Admitting Physician Reubin Milan, MD  Outpatient Primary MD for the patient is System, Provider Not In  LOS - 7   Chief Complaint  Patient presents with   Code Sepsis        Assessment & Plan    Patient seen briefly today due for discharge soon per Discharge done yesterday by me no further issues, Vital signs stable, patient feels fine.  Await bed at SNF    Medications  Scheduled Meds:  apixaban  2.5 mg Oral BID   cefadroxil  500 mg Oral BID   Chlorhexidine Gluconate Cloth  6 each Topical Daily   doxazosin  2 mg Oral Daily   doxycycline  100 mg Oral Q12H   ezetimibe  10 mg Oral Daily   furosemide  40 mg Oral Daily   insulin aspart  0-5 Units Subcutaneous QHS   insulin aspart  0-6 Units Subcutaneous TID WC   isosorbide mononitrate  30 mg Oral Daily   metoprolol succinate  100 mg Oral Q supper   pantoprazole  40 mg Oral BID   pravastatin  40 mg Oral q1800   spironolactone  25 mg Oral Daily   tamsulosin  0.4 mg Oral Daily   Continuous Infusions: PRN Meds:.acetaminophen **OR** acetaminophen, diphenhydrAMINE, LORazepam, prochlorperazine    Time Spent in minutes   10 minutes   Lala Lund M.D on 07/25/2022 at 8:21 AM  Between 7am to 7pm - Pager - [phone]  After 7pm go to [email or web address] - password [email or web address]  And look for the night coverage person covering for me after hours  [facility] Office  [phone]    Subjective:   Rochelle Nephew today has, No headache, No chest pain, No abdominal pain - No Nausea, No new weakness tingling or numbness, No Cough - SOB.     Objective:   Vitals:   07/24/22 2000 07/25/22 0000 07/25/22 0434 07/25/22 0800  BP: 132/64 (!) 157/73 (!) 151/70 (!) 149/64  Pulse: 80 73 79   Resp: 17 18 (!) 23 16  Temp: 98.2 F (36.8 C) 97.9 F (36.6 C) (!) 97.5 F (36.4 C) 97.8 F (36.6 C)  TempSrc: Oral Oral Oral Oral  SpO2: 90% 97% 97%   Weight:      Height:        Wt Readings from Last 3 Encounters:  07/24/22 76.9  kg  07/14/22 85.3 kg  07/13/22 85.1 kg     Intake/Output Summary (Last 24 hours) at 07/25/2022 5868 Last data filed at 07/25/2022 0218 Gross per 24 hour  Intake 480 ml  Output 800 ml  Net -320 ml    Exam  Awake Alert, No new F.N deficits, Normal affect [place].AT,PERRAL Supple Neck, No JVD,   Symmetrical Chest wall movement, Good air movement bilaterally, CTAB RRR,No Gallops, Rubs or new Murmurs,  +ve B.Sounds, Abd Soft, No tenderness,     Left lower extremity mid calf & lateral foot/ 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

## 2022-07-26 DIAGNOSIS — L03116 Cellulitis of left lower limb: Secondary | ICD-10-CM | POA: Diagnosis not present

## 2022-07-26 LAB — PATHOLOGIST SMEAR REVIEW: Path Review: NEGATIVE

## 2022-07-26 LAB — GLUCOSE, CAPILLARY: Glucose-Capillary: 164 mg/dL — ABNORMAL HIGH (ref 70–99)

## 2022-07-26 NOTE — TOC Transition Note (Signed)
Transition of Care [facility]) - CM/SW Discharge Note   Patient Details  Name: Steve [name]. MRN: [id number] Date of Birth: [birth year]  Transition of Care [facility]) CM/SW Contact:  Coralee Pesa, [place] Phone Number: 07/26/2022, 11:48 AM   Clinical Narrative:    Pt to be transported to [facility] via [facility].  Nurse to call report to [phone].   Final next level of care: Skilled Nursing Facility Barriers to Discharge: Barriers Resolved   Patient Goals and CMS Choice Patient states their goals for this hospitalization and ongoing recovery are:: Rehab and then move into ALF CMS Medicare.gov Compare Post Acute Care list provided to:: Patient Choice offered to / list presented to : Patient  Discharge Placement              Patient chooses bed at: [facility] Patient to be transferred to facility by: [facility] transport Name of family member notified: [name] Patient and family notified of of transfer: 07/26/22  Discharge Plan and Services In-house Referral: Clinical Social Work   Post Acute Care Choice: [place]                               Social Determinants of Health (SDOH) Interventions     Readmission Risk Interventions    07/22/2022   11:13 AM 07/13/2022   10:22 AM  Readmission Risk Prevention Plan  Transportation Screening Complete Complete  PCP or Specialist Appt within 3-5 Days  Complete  HRI or [place]  Complete  Social Work Consult for [place] Planning/Counseling  Complete  Palliative Care Screening  Not Applicable  Medication Review Press photographer) Complete Complete  PCP or Specialist appointment within 3-5 days of discharge Complete   HRI or [place] Complete   SW Recovery Care/Counseling Consult Complete   Palliative Care Screening Not [place] Complete

## 2022-07-26 NOTE — Progress Notes (Signed)
[facility]                                                                                                                                                                         Patient Demographics  Steve [name], is a 81 y.o. male  WVP:[id number]  [id number]:[id number]  DOB - 1941-05-03  Admit date - 07/18/2022  Admitting Physician Reubin Milan, MD  Outpatient Primary MD for the patient is System, Provider Not In  LOS - 8   Chief Complaint  Patient presents with   Code Sepsis        Assessment & Plan    Patient seen briefly today due for discharge soon per Discharge done on 07/24/22 by me no further issues, Vital signs stable, patient feels fine.  Await bed at SNF    Medications  Scheduled Meds:  apixaban  2.5 mg Oral BID   cefadroxil  500 mg Oral BID   Chlorhexidine Gluconate Cloth  6 each Topical Daily   doxazosin  2 mg Oral Daily   doxycycline  100 mg Oral Q12H   ezetimibe  10 mg Oral Daily   furosemide  40 mg Oral Daily   insulin aspart  0-5 Units Subcutaneous QHS   insulin aspart  0-6 Units Subcutaneous TID WC   isosorbide mononitrate  30 mg Oral Daily   metoprolol succinate  100 mg Oral Q supper   pantoprazole  40 mg Oral BID   pravastatin  40 mg Oral q1800   spironolactone  25 mg Oral Daily   tamsulosin  0.4 mg Oral Daily   Continuous Infusions: PRN Meds:.acetaminophen **OR** acetaminophen, diphenhydrAMINE, LORazepam, prochlorperazine    Time Spent in minutes   10 minutes   Lala Lund M.D on 07/26/2022 at 8:50 AM  Between 7am to 7pm - Pager - [phone]  After 7pm go to [email or web address] - password [email or web address]  And look for the night coverage person covering for me after hours  [facility] Office  [phone]    Subjective:   Steve [name] today has, No headache, No chest pain, No abdominal pain - No Nausea, No new weakness tingling or numbness, No Cough - SOB.     Objective:   Vitals:   07/25/22 1919 07/26/22 0000 07/26/22 0452 07/26/22 0749  BP: (!) 123/58  (!) 141/75 139/60  Pulse: 71  67 67  Resp: 19  18 17   Temp: 97.6 F (36.4 C) 98.4 F (36.9 C) 97.8 F (36.6 C) 97.8 F (36.6 C)  TempSrc: Oral Oral Oral Oral  SpO2: 97% 98% 99%   Weight:      Height:        Wt Readings from Last 3 Encounters:  07/24/22 76.9 kg  07/14/22 85.3 kg  07/13/22 85.1 kg     Intake/Output Summary (Last 24 hours) at 07/26/2022 0850 Last data filed at 07/26/2022 0500 Gross per 24 hour  Intake 1000 ml  Output --  Net 1000 ml    Exam  Awake Alert, No new F.N deficits, Normal affect [place].AT,PERRAL Supple Neck, No JVD,   Symmetrical Chest wall movement, Good air movement bilaterally, CTAB RRR,No Gallops, Rubs or new Murmurs,  +ve B.Sounds, Abd Soft, No tenderness,     Left lower extremity mid calf & lateral foot/ 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

## 2022-08-02 NOTE — Telephone Encounter (Signed)
Appt has been scheduled.

## 2022-08-03 ENCOUNTER — Ambulatory Visit (INDEPENDENT_AMBULATORY_CARE_PROVIDER_SITE_OTHER): Payer: Medicare Other | Admitting: Physician Assistant

## 2022-08-03 ENCOUNTER — Telehealth: Payer: Self-pay | Admitting: Podiatry

## 2022-08-03 VITALS — BP 132/66 | HR 86 | Temp 97.0°F | Resp 20 | Ht 69.0 in | Wt 160.0 lb

## 2022-08-03 DIAGNOSIS — I739 Peripheral vascular disease, unspecified: Secondary | ICD-10-CM

## 2022-08-03 NOTE — Telephone Encounter (Signed)
Received a call from [facility] asking for me to call pt to get scheduled to follow up on his amputation. Upon looking pt has not been scheduled for a follow up from surgery on 9.15.2023 left partial amputation.   I spoke to pts son and scheduled pt for 10.18.2023.

## 2022-08-03 NOTE — Progress Notes (Signed)
POST OPERATIVE OFFICE NOTE    CC:  F/u for surgery  HPI:  This is a 81 y.o. male who is s/p Left common femoral - peroneal artery bypass with 22m PTFE in subcutaneous tunnel for  atherosclerosis of native arteries of left lower extremity causing ulceration.  Preoperative angiogram shows popliteal artery occlusion with reconstitution of the peroneal artery in the mid calf.  This was followed by Partial fifth ray amputation left foot by Dr. BEdrick KinsDPM.   Pt returns today for follow up.  Pt states he is walking daily at rehab SNF.  He denies pain or edema.  He denies rest pain, new non healing wounds or claudication.  He is medically managed on Eliquis and Statin daily.   Allergies  Allergen Reactions   Januvia [Sitagliptin] Other (See Comments)    Soreness to stomach area. Intolerance    Statins Other (See Comments)    Myalgias, muscle weakness, swelling, pain- prescribed Lipitor in 06/2022 (??)    Current Outpatient Medications  Medication Sig Dispense Refill   acetaminophen (TYLENOL) 500 MG tablet Take 500 mg by mouth every 6 (six) hours as needed for moderate pain.     blood glucose meter kit and supplies Dispense based on patient and insurance preference. Use up to four times daily as directed. (FOR ICD-10 E10.9, E11.9). 1 each 1   cefadroxil (DURICEF) 500 MG capsule Take 1 capsule (500 mg total) by mouth 2 (two) times daily for 22 days. 44 capsule 0   Cholecalciferol (VITAMIN D3) 50 MCG (2000 UT) capsule Take 2,000 Units by mouth daily.     doxazosin (CARDURA) 2 MG tablet Take 2 mg by mouth daily.     doxycycline (VIBRA-TABS) 100 MG tablet Take 1 tablet (100 mg total) by mouth every 12 (twelve) hours.     ELIQUIS 2.5 MG TABS tablet Take 2.5 mg by mouth 2 (two) times daily.     ezetimibe (ZETIA) 10 MG tablet Take 1 tablet (10 mg total) by mouth daily. 90 tablet 3   furosemide (LASIX) 40 MG tablet Take 1 tablet (40 mg total) by mouth daily. 30 tablet    insulin aspart  (NOVOLOG) 100 UNIT/ML FlexPen Before each meal 3 times a day, 140-199 - 2 units, 200-250 - 4 units, 251-299 - 6 units,  300-349 - 8 units,  350 or above 10 units. Insulin PEN if approved, provide syringes and needles if needed.Please switch to any approved short acting Insulin if needed. 15 mL 0   Insulin Syringe-Needle U-100 (INSULIN SYRINGE .3CC/31GX5/16") 31G X 5/16" 0.3 ML MISC Use with insulin 100 each 2   isosorbide mononitrate (IMDUR) 30 MG 24 hr tablet Take 1 tablet (30 mg total) by mouth daily. 30 tablet 0   metFORMIN (GLUCOPHAGE) 1000 MG tablet Take 1,000 mg by mouth at bedtime.     metoprolol succinate (TOPROL-XL) 100 MG 24 hr tablet Take 1 tablet (100 mg total) by mouth daily. Take with or immediately following a meal. (Patient taking differently: Take 100 mg by mouth daily with supper. Take with or immediately following a meal.) 30 tablet 3   pantoprazole (PROTONIX) 40 MG tablet Take 1 tablet (40 mg total) by mouth 2 (two) times daily.     polyethylene glycol (MIRALAX / GLYCOLAX) 17 g packet Take 17 g by mouth daily as needed for mild constipation. 14 each 0   pravastatin (PRAVACHOL) 40 MG tablet Take 40 mg by mouth at bedtime.     spironolactone (ALDACTONE) 25  MG tablet Take 0.5 tablets (12.5 mg total) by mouth daily.     vitamin B-12 (CYANOCOBALAMIN) 500 MCG tablet Take 500 mcg by mouth daily with breakfast.      No current facility-administered medications for this visit.     ROS:  See HPI  Physical Exam:       Incision:  Lower leg incision healed, staples removed today he tolerated this well Left groin fully healed, soft. Extremities:  Doppler brisk DP/Peroneal and PT Neuro: sensation intact     Assessment/Plan:  This is a 81 y.o. male who is s/p:This is a 81 y.o. male who is s/p Left common femoral - peroneal artery bypass with 59m PTFE in subcutaneous tunnel for  atherosclerosis of native arteries of left lower extremity causing ulceration.  Preoperative angiogram  shows popliteal artery occlusion with reconstitution of the peroneal artery in the mid calf.  This was followed by Partial fifth ray amputation left foot by Dr. BEdrick KinsDPM.   The incisions have healed well and her has brisk left LE doppler signals indicating the bypass is patent.     Dry dressing applied to lower leg post staple removal for 24 hours.  F/U in 3 months for bypass duplex and ABI baseline post bypass.  He will need a f/u with DPM in 1 week.  Our Office will contact [facility].   ERoxy HorsemanPA-C [facility] [phone]  Clinic MD:  HStanford Breed

## 2022-08-06 ENCOUNTER — Other Ambulatory Visit: Payer: Self-pay

## 2022-08-06 DIAGNOSIS — I739 Peripheral vascular disease, unspecified: Secondary | ICD-10-CM

## 2022-08-10 ENCOUNTER — Encounter: Payer: Self-pay | Admitting: Internal Medicine

## 2022-08-10 ENCOUNTER — Other Ambulatory Visit: Payer: Self-pay

## 2022-08-10 ENCOUNTER — Ambulatory Visit (INDEPENDENT_AMBULATORY_CARE_PROVIDER_SITE_OTHER): Payer: Medicare Other | Admitting: Internal Medicine

## 2022-08-10 VITALS — BP 148/73 | HR 67 | Temp 97.5°F

## 2022-08-10 DIAGNOSIS — M8618 Other acute osteomyelitis, other site: Secondary | ICD-10-CM

## 2022-08-10 DIAGNOSIS — E1169 Type 2 diabetes mellitus with other specified complication: Secondary | ICD-10-CM | POA: Diagnosis not present

## 2022-08-10 DIAGNOSIS — Z7984 Long term (current) use of oral hypoglycemic drugs: Secondary | ICD-10-CM | POA: Diagnosis not present

## 2022-08-10 DIAGNOSIS — Z794 Long term (current) use of insulin: Secondary | ICD-10-CM

## 2022-08-10 DIAGNOSIS — L97509 Non-pressure chronic ulcer of other part of unspecified foot with unspecified severity: Secondary | ICD-10-CM | POA: Diagnosis not present

## 2022-08-10 DIAGNOSIS — E11621 Type 2 diabetes mellitus with foot ulcer: Secondary | ICD-10-CM

## 2022-08-10 DIAGNOSIS — Z95828 Presence of other vascular implants and grafts: Secondary | ICD-10-CM

## 2022-08-10 DIAGNOSIS — T8149XA Infection following a procedure, other surgical site, initial encounter: Secondary | ICD-10-CM

## 2022-08-10 NOTE — Patient Instructions (Signed)
Labs today  Stop doxycycline and cefadroxil on 10/22  See me in 4-6 weeks again to make sure you still do ok off antibiotics. If redness/pain recurs in the left foot please see [place] sooner

## 2022-08-10 NOTE — Progress Notes (Signed)
[facility]  Patient Active Problem List   Diagnosis Date Noted   Cellulitis of left lower extremity 07/18/2022   Presbyopia 07/18/2022   Acute blood loss anemia 07/18/2022   Moderate protein malnutrition (HCC) 07/18/2022   GI bleed 07/18/2022   Hypoglycemia 07/18/2022   Chronic combined systolic and diastolic CHF (congestive heart failure) ([place]) 07/08/2022   Prolonged QT interval 07/03/2022   Sepsis due to undetermined organism ([place]) 06/25/2022   Dehydration with hyponatremia 06/25/2022   DKA (diabetic ketoacidoses) 04/16/2019   Atrial fibrillation ([place]) 12/15/2018   CKD (chronic kidney disease), symptom management only, stage 4 (severe) ([place]) 09/08/2018   Sepsis ([place]) 09/07/2018   Diabetic ulcer of toe of left foot associated with type 2 diabetes mellitus ([place])    Cellulitis of left anterior lower leg 07/25/2018   Iron deficiency anemia 07/24/2018   Acute kidney injury superimposed on chronic kidney disease ([place]) 07/24/2018   Hyponatremia 07/24/2018   Hypokalemia 72/82/0601   Metabolic acidosis 56/15/3794   Acute renal failure (ARF) ([place]) 07/24/2018   Normocytic anemia 07/24/2018   Thrombocytopenia ([place]) 07/24/2018   Ischemic cardiomyopathy    CAD (coronary artery disease)    PAF (paroxysmal atrial fibrillation) ([place])    Essential hypertension    Type II diabetes mellitus ([place])    Near syncope 04/01/2015   Dizziness and giddiness 04/01/2015   Fatigue 04/01/2015   Chronic renal disease, stage II 04/01/2015   RBBB 04/01/2015   Angina pectoris ([place]) 04/01/2015   PAT (paroxysmal atrial tachycardia) ([place]) 12/16/2011   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   Diabetes mellitus, type 2 ([place]) 05/31/2011   Hypertension    PVD (peripheral vascular disease) ([place])    Hyperlipidemia       Subjective:    Patient ID: Steve Mask., male    DOB: 1941-08-19, 81 y.o.   MRN: [id number]  Chief Complaint  Patient presents with   Follow-up    Cc- f/u  vascular graft infection  HPI:  Steve Traweek. is a 81 y.o. 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 He was on doxy on discharge during prior admission. We decided to do 4 more weeks doxycycline/cefadroxil to cover for potential OM of mt and this would address surgical site infection as well   No n/v/diarrhea Feels well Good appetite Stitches remain on the foot; staples removed a week prior by vascular surgery for the vascular graft incision site  No pain/dehiscence No f/c I reviewed snf mar and confirm he takes doxy/cefadroxil  Allergies  Allergen Reactions   Januvia [Sitagliptin] Other (See Comments)    Soreness to stomach area. Intolerance    Statins Other (See Comments)    Myalgias, muscle weakness, swelling, pain- prescribed Lipitor in 06/2022 (??)      Outpatient Medications Prior to Visit  Medication Sig Dispense Refill   acetaminophen (TYLENOL) 500 MG tablet Take 500 mg by mouth every 6 (six) hours as needed for moderate pain.     blood glucose meter kit  and supplies Dispense based on patient and insurance preference. Use up to four times daily as directed. (FOR ICD-10 E10.9, E11.9). 1 each 1   cefadroxil (DURICEF) 500 MG capsule Take 1 capsule (500 mg total) by mouth 2 (two) times daily for 22 days. 44 capsule 0   Cholecalciferol (VITAMIN D3) 50 MCG (2000 UT) capsule Take 2,000 Units by mouth daily.     doxazosin (CARDURA) 2 MG tablet Take 2 mg by mouth daily.      doxycycline (VIBRA-TABS) 100 MG tablet Take 1 tablet (100 mg total) by mouth every 12 (twelve) hours.     ELIQUIS 2.5 MG TABS tablet Take 2.5 mg by mouth 2 (two) times daily.     ezetimibe (ZETIA) 10 MG tablet Take 1 tablet (10 mg total) by mouth daily. 90 tablet 3   furosemide (LASIX) 40 MG tablet Take 1 tablet (40 mg total) by mouth daily. 30 tablet    insulin aspart (NOVOLOG) 100 UNIT/ML FlexPen Before each meal 3 times a day, 140-199 - 2 units, 200-250 - 4 units, 251-299 - 6 units,  300-349 - 8 units,  350 or above 10 units. Insulin PEN if approved, provide syringes and needles if needed.Please switch to any approved short acting Insulin if needed. 15 mL 0   Insulin Syringe-Needle U-100 (INSULIN SYRINGE .3CC/31GX5/16") 31G X 5/16" 0.3 ML MISC Use with insulin 100 each 2   isosorbide mononitrate (IMDUR) 30 MG 24 hr tablet Take 1 tablet (30 mg total) by mouth daily. 30 tablet 0   metFORMIN (GLUCOPHAGE) 1000 MG tablet Take 1,000 mg by mouth at bedtime.     metoprolol succinate (TOPROL-XL) 100 MG 24 hr tablet Take 1 tablet (100 mg total) by mouth daily. Take with or immediately following a meal. (Patient taking differently: Take 100 mg by mouth daily with supper. Take with or immediately following a meal.) 30 tablet 3   pantoprazole (PROTONIX) 40 MG tablet Take 1 tablet (40 mg total) by mouth 2 (two) times daily.     polyethylene glycol (MIRALAX / GLYCOLAX) 17 g packet Take 17 g by mouth daily as needed for mild constipation. 14 each 0   pravastatin (PRAVACHOL) 40 MG tablet Take 40 mg by mouth at bedtime.     spironolactone (ALDACTONE) 25 MG tablet Take 0.5 tablets (12.5 mg total) by mouth daily.     vitamin B-12 (CYANOCOBALAMIN) 500 MCG tablet Take 500 mcg by mouth daily with breakfast.      No facility-administered medications prior to visit.     Social History   Socioeconomic History   Marital status: Widowed    Spouse name: Not on file   Number of children: 1   Years of education: Not  on file   Highest education level: Not on file  Occupational History   Occupation: Retail buyer    Comment: retired  Tobacco Use   Smoking status: Former    Packs/day: 1.50    Years: 20.00    Total pack years: 30.00    Types: Cigarettes    Quit date: 05/27/1979    Years since quitting: 43.2   Smokeless tobacco: Former  Scientific laboratory technician Use: Never used  Substance and Sexual Activity   Alcohol use: Yes    Alcohol/week: 4.0 standard drinks of alcohol    Types: 4 Shots of liquor per week    Comment: 4 "canadian clubs each night"   Drug use: No   Sexual activity: Yes  Other Topics Concern  Not on file  Social History Narrative   Not on file   Social Determinants of Health   Financial Resource Strain: Not on file  Food Insecurity: No Food Insecurity (07/01/2022)   Hunger Vital Sign    Worried About Running Out of Food in the Last Year: Never true    Ran Out of Food in the Last Year: Never true  Transportation Needs: No Transportation Needs (07/01/2022)   PRAPARE - Hydrologist (Medical): No    Lack of Transportation (Non-Medical): No  Physical Activity: Not on file  Stress: Not on file  Social Connections: Not on file  Intimate Partner Violence: Not At Risk (07/01/2022)   Humiliation, Afraid, Rape, and Kick questionnaire    Fear of Current or Ex-Partner: No    Emotionally Abused: No    Physically Abused: No    Sexually Abused: No      Review of Systems    All other ros negative  Objective:    BP (!) 148/73   Pulse 67   Temp (!) 97.5 F (36.4 C) (Temporal)   SpO2 100%  Nursing note and vital signs reviewed.  Physical Exam     General/constitutional: no distress, pleasant; conversant; here with son; in wheel chair HEENT: Normocephalic, PER, Conj Clear, EOMI, Oropharynx clear Neck supple CV: rrr no mrg Lungs: clear to auscultation, normal respiratory effort Abd: Soft, Nontender Ext: no edema Skin/msk; see pictures; surgical  site all healed. Old hyperpigmented bruising changes bilateral anterior legs Neuro: nonfocal        Labs:  Micro:  Serology:  Imaging:  Assessment & Plan:   Problem List Items Addressed This Visit   None     No orders of the defined types were placed in this encounter.    Abx: 9/24-c vanc 9/24-c ceftriaxone                                                          Assessment: 81 yo male hx CAD, PAD, ischemic cardiomypoathy, ckd3, pAfib on eliquis, dm2, recent left diabetic foot 5th toe om/cellulitis s/p fem-peroneal bypass then partial 5th ray, readmitted 9/24 with ongoing cellulitis/extension to distal LLE, aki on ckd, and gib/ams, determined to have partially treated 5th mt om and surgical site infection superficial of the graft (u/s at [place] [facility] clinic no fluid collection); patient on doxy on admission 9/24. He is here for hospital f/u for diabetic om/surgical site infection  08/10/22 id assessment Tolerating abx No sign of active infection left LE The stitches of the 5th mt resection site remains to be removed   He has until 10/22 to finish doxy/cefadroxil  Labs today F/u around 5 weeks to make sure he does ok off meds  I have spent a total of 30 minutes of face-to-face and non-face-to-face time, excluding clinical staff time, preparing to see patient, ordering tests and/or medications, and provide counseling the patient   Follow-up: Return in about 5 weeks (around 09/14/2022).      Jabier Mutton, [facility]ious [facility] 08/10/2022, 10:45 AM

## 2022-08-11 ENCOUNTER — Ambulatory Visit (INDEPENDENT_AMBULATORY_CARE_PROVIDER_SITE_OTHER): Payer: Medicare Other | Admitting: Podiatry

## 2022-08-11 DIAGNOSIS — L97522 Non-pressure chronic ulcer of other part of left foot with fat layer exposed: Secondary | ICD-10-CM

## 2022-08-11 LAB — CBC
HCT: 31.9 % — ABNORMAL LOW (ref 38.5–50.0)
Hemoglobin: 10.6 g/dL — ABNORMAL LOW (ref 13.2–17.1)
MCH: 30.6 pg (ref 27.0–33.0)
MCHC: 33.2 g/dL (ref 32.0–36.0)
MCV: 92.2 fL (ref 80.0–100.0)
MPV: 13.8 fL — ABNORMAL HIGH (ref 7.5–12.5)
Platelets: 83 10*3/uL — ABNORMAL LOW (ref 140–400)
RBC: 3.46 10*6/uL — ABNORMAL LOW (ref 4.20–5.80)
RDW: 15.7 % — ABNORMAL HIGH (ref 11.0–15.0)
WBC: 5.3 10*3/uL (ref 3.8–10.8)

## 2022-08-11 LAB — COMPLETE METABOLIC PANEL WITH GFR
AG Ratio: 1.5 (calc) (ref 1.0–2.5)
ALT: 47 U/L — ABNORMAL HIGH (ref 9–46)
AST: 42 U/L — ABNORMAL HIGH (ref 10–35)
Albumin: 4.3 g/dL (ref 3.6–5.1)
Alkaline phosphatase (APISO): 49 U/L (ref 35–144)
BUN/Creatinine Ratio: 22 (calc) (ref 6–22)
BUN: 54 mg/dL — ABNORMAL HIGH (ref 7–25)
CO2: 20 mmol/L (ref 20–32)
Calcium: 9.7 mg/dL (ref 8.6–10.3)
Chloride: 102 mmol/L (ref 98–110)
Creat: 2.51 mg/dL — ABNORMAL HIGH (ref 0.70–1.22)
Globulin: 2.9 g/dL (calc) (ref 1.9–3.7)
Glucose, Bld: 220 mg/dL — ABNORMAL HIGH (ref 65–99)
Potassium: 4.4 mmol/L (ref 3.5–5.3)
Sodium: 132 mmol/L — ABNORMAL LOW (ref 135–146)
Total Bilirubin: 0.8 mg/dL (ref 0.2–1.2)
Total Protein: 7.2 g/dL (ref 6.1–8.1)
eGFR: 25 mL/min/{1.73_m2} — ABNORMAL LOW (ref >=60)

## 2022-08-11 LAB — C-REACTIVE PROTEIN: CRP: 0.4 mg/L (ref <8.0)

## 2022-08-11 MED ORDER — GENTAMICIN SULFATE 0.1 % EX CREA: 1.0000 | TOPICAL_CREAM | Freq: Two times a day (BID) | CUTANEOUS | 1 refills | Status: DC

## 2022-08-11 NOTE — Progress Notes (Signed)
Chief Complaint  Patient presents with   Post-op Follow-up    dos 9.15.2023/PARTIAL LEFT FIFTH RAY AMPUTATION    Subjective:  Patient presents today status post partial fifth ray amputation of the left foot performed inpatient. DOS: 07/09/2022.  Patient states that he is feeling well.  He is currently in a rehab facility.  He is weightbearing in surgical shoe.  Presenting for his first outpatient follow-up here in the office  Past Medical History:  Diagnosis Date   CAD (coronary artery disease)    a. s/p CABG x 29 Nov 2011 with LIMA to LAD, SVG to OM1 and distal LCX, SVG to ramus intermediate   Essential hypertension    Hyperlipidemia    Intermittent claudication ([place])    Ischemic cardiomyopathy    a. 03/2015 Echo: EF45-50%, Gr1 DD, mild MR.   PAF (paroxysmal atrial fibrillation) ([place])    a. post op atrial fib 11/2011; short course of amiodarone; stopped 01/25/12   Peripheral arterial disease ([place])    Right bundle branch block    Type II diabetes mellitus [facility])     Past Surgical History:  Procedure Laterality Date   ABDOMINAL AORTOGRAM W/LOWER EXTREMITY Left 07/02/2022   Procedure: ABDOMINAL AORTOGRAM W/LOWER EXTREMITY;  Surgeon: Cherre Robins, MD;  Location: [facility];  Service: Cardiovascular;  Laterality: Left;   AMPUTATION Left 07/09/2022   Procedure: PARTIAL LEFT FIFTH RAY AMPUTATION;  Surgeon: Edrick Kins, DPM;  Location: [facility];  Service: Podiatry;  Laterality: Left;   BIOPSY  07/21/2022   Procedure: BIOPSY;  Surgeon: Wilford Corner, MD;  Location: [facility];  Service: Gastroenterology;;   Vilinda Blanks GRAFT FEMORAL-PERONEAL Left 07/07/2022   Procedure: LEFT FEMORAL-PERONEAL ARTERY BYPASS;  Surgeon: Cherre Robins, MD;  Location: [facility];  Service: Vascular;  Laterality: Left;   CARDIAC CATHETERIZATION  01/16/1999   Est. EF of 65% -- Nonobstruction atherosclerotic coronary artery disease -- Normal left ventricular function       CARDIAC ELECTROPHYSIOLOGY STUDY AND  ABLATION  05/21/1999   Normal sinus funtion -- Mildly prolonged interatrial conduction times -- Normal A-V node funtion -- Normal His Purkinje system function -- fNo accessory pathway -- No inducible ventricular tachycardia in the presence of the or in the absence of isoproterenol with programmed stimulation or with burst pacing -- Nikki Dom, M.D. [place]   CORONARY ARTERY BYPASS GRAFT  12/10/2011   Procedure: CORONARY ARTERY BYPASS GRAFTING (CABG);  Surgeon: Tharon Aquas Adelene Idler, MD;  Location: [facility];  Service: Open Heart Surgery;  Laterality: N/A;  Coronary Artery bypass graft on pump times four utilizing left internal mammary artery and left saphenous vein harvested endoscopically   ESOPHAGOGASTRODUODENOSCOPY N/A 07/21/2022   Procedure: ESOPHAGOGASTRODUODENOSCOPY (EGD);  Surgeon: Wilford Corner, MD;  Location: [facility];  Service: Gastroenterology;  Laterality: N/A;   LEFT HEART CATH AND CORS/GRAFTS ANGIOGRAPHY N/A 12/18/2018   Procedure: LEFT HEART CATH AND CORS/GRAFTS ANGIOGRAPHY;  Surgeon: Belva Crome, MD;  Location: [facility];  Service: Cardiovascular;  Laterality: N/A;   LEFT HEART CATHETERIZATION WITH CORONARY ANGIOGRAM N/A 12/09/2011   Procedure: LEFT HEART CATHETERIZATION WITH CORONARY ANGIOGRAM;  Surgeon: Peter M Martinique, MD;  Location: [facility] CATH LAB;  Service: Cardiovascular;  Laterality: N/A;   LOWER EXTREMITY ANGIOGRAM Left 07/28/2018   Procedure: Lower Extremity Angiogram;  Surgeon: Angelia Mould, MD;  Location: [facility];  Service: Cardiovascular;  Laterality: Left;  Allergies  Allergen Reactions   Januvia [Sitagliptin] Other (See Comments)    Soreness to stomach area. Intolerance    Statins Other (See Comments)    Myalgias, muscle weakness, swelling, pain- prescribed Lipitor in 06/2022 (??)     Objective/Physical Exam Neurovascular status intact.  Skin incisions appear to be well coapted with  sutures intact. No sign of infectious process noted. No dehiscence. No active bleeding noted. Moderate edema noted to the surgical extremity. There is a small wound noted to the plantar aspect of the fifth MTP of the left foot.  Measures approximately 0.5 x 0.5 x 0.2 cm.  Granular wound base.  Clinically there is no indication of infection and no erythema or drainage.  There is no exposed bone muscle tendon ligament or joint please see above noted photo   Assessment: 1. s/p partial fifth ray amputation left. DOS: 07/09/2022   Plan of Care:  1. Patient was evaluated.  2.  Sutures removed 3.  Prescription for gentamicin cream applied to the wound daily with a light Band-Aid 4.  Patient may discontinue the postsurgical shoe.  Recommend good supportive house slippers 5.  Return to clinic in 3 weeks  Edrick Kins, DPM [facility]  Dr. Edrick Kins, DPM    [address], [place] [address]                Office [phone]  Fax [phone]

## 2022-08-12 ENCOUNTER — Telehealth: Payer: Self-pay

## 2022-08-12 NOTE — Telephone Encounter (Signed)
Patient is currently in SNF, Results given to [place], Therapist, sports at [facility]. [place] stated that patient did have a nosebleed this morning around 7 AM but it stopped. There is a doctor at SNF and they are advised to keep an eye on him due to low platelet count.   [place], CMA

## 2022-08-12 NOTE — Telephone Encounter (Signed)
-----   Message from Jabier Mutton, MD sent at 08/12/2022 11:33 AM EDT ----- Hi team. His platelet is a little low but looks like it has been bouncing around   Please let him know otherwise his lab is good and we can just recheck all in the 5 weeks follow up   If he starts bruising or having bleeding let his pcp know (let should let them know too about the low platelet)  thanks

## 2022-09-01 ENCOUNTER — Other Ambulatory Visit: Payer: Medicare Other

## 2022-09-01 ENCOUNTER — Encounter: Payer: Medicare Other | Admitting: Podiatry

## 2022-09-22 ENCOUNTER — Ambulatory Visit: Payer: Medicare Other | Admitting: Internal Medicine

## 2022-11-09 ENCOUNTER — Other Ambulatory Visit (HOSPITAL_COMMUNITY): Payer: Medicare Other

## 2022-11-09 ENCOUNTER — Encounter (HOSPITAL_COMMUNITY): Payer: Medicare Other

## 2022-11-09 ENCOUNTER — Ambulatory Visit: Payer: Medicare Other

## 2023-01-03 ENCOUNTER — Ambulatory Visit (INDEPENDENT_AMBULATORY_CARE_PROVIDER_SITE_OTHER)
Admission: RE | Admit: 2023-01-03 | Discharge: 2023-01-03 | Disposition: A | Discharge status: Home / Self Care | Payer: Medicare Other | Source: Ambulatory Visit | Attending: Surgery | Admitting: Surgery

## 2023-01-03 ENCOUNTER — Ambulatory Visit (HOSPITAL_COMMUNITY)
Admission: RE | Admit: 2023-01-03 | Discharge: 2023-01-03 | Disposition: A | Discharge status: Home / Self Care | Payer: Medicare Other | Source: Ambulatory Visit | Attending: Surgery | Admitting: Surgery

## 2023-01-03 DIAGNOSIS — I739 Peripheral vascular disease, unspecified: Secondary | ICD-10-CM

## 2023-01-03 LAB — VAS US ABI WITH/WO TBI: Left ABI: 0.5

## 2023-01-05 ENCOUNTER — Ambulatory Visit (INDEPENDENT_AMBULATORY_CARE_PROVIDER_SITE_OTHER): Payer: Medicare Other | Admitting: Physician Assistant

## 2023-01-05 ENCOUNTER — Other Ambulatory Visit: Payer: Self-pay

## 2023-01-05 VITALS — BP 128/59 | HR 71 | Temp 97.6°F | Wt 186.0 lb

## 2023-01-05 DIAGNOSIS — I739 Peripheral vascular disease, unspecified: Secondary | ICD-10-CM | POA: Diagnosis not present

## 2023-01-05 DIAGNOSIS — I7035 Atherosclerosis of unspecified type of bypass graft(s) of other extremity with ulceration: Secondary | ICD-10-CM

## 2023-01-05 DIAGNOSIS — T82599A Other mechanical complication of unspecified cardiac and vascular devices and implants, initial encounter: Secondary | ICD-10-CM

## 2023-01-05 NOTE — Progress Notes (Signed)
Office Note     CC:  follow up Requesting Provider:  No ref. provider found  HPI: Steve [name]. is a 82 y.o. (1941-02-25) male who presents for follow up of PAD. He is s/p  Left common femoral - peroneal artery bypass with 75m PTFE in subcutaneous tunnel by Dr. HStanford Breedon 07/07/22 for atherosclerosis of native arteries of left lower extremity causing ulceration of left 4th toe. This was followed by Partial fifth ray amputation left foot by Dr. BEdrick KinsDPM on 07/09/22. His 4th toe subsequently healed however he reports that starting in October he started to have significant pain in his left foot and since has developed wounds on his foot. He also has some wounds on his right toes as well. These have been taken care of by the wound care nurses at his assisted living facility. He says that he has had trouble sleeping at night and only relief he gets is if he sleeps in his recliner with his feet in dependent position on the floor. He does have significant swelling in both of his legs but he says he is unable to elevate his legs because it causes them to hurt too much. He otherwise says he gets around fairly well. He uses rolling walker to ambulate. He says he did not follow up earlier because he says he has just been dealing with the pain and figured his nurses would tell him he needed to if there was any concern.  The pt is on a statin for cholesterol management.  The pt  on a daily aspirin.   Other AC:  Eliquis The pt is on BB for hypertension.   The pt is diabetic Tobacco hx:  Former  Past Medical History:  Diagnosis Date   CAD (coronary artery disease)    a. s/p CABG x 29 Nov 2011 with LIMA to LAD, SVG to OM1 and distal LCX, SVG to ramus intermediate   Essential hypertension    Hyperlipidemia    Intermittent claudication ([facility]    Ischemic cardiomyopathy    a. 03/2015 Echo: EF45-50%, Gr1 DD, mild MR.   PAF (paroxysmal atrial fibrillation) ([facility]    a. post op atrial fib 11/2011; short  course of amiodarone; stopped 01/25/12   Peripheral arterial disease ([facility]    Right bundle branch block    Type II diabetes mellitus ([facility]     Past Surgical History:  Procedure Laterality Date   ABDOMINAL AORTOGRAM W/LOWER EXTREMITY Left 07/02/2022   Procedure: ABDOMINAL AORTOGRAM W/LOWER EXTREMITY;  Surgeon: HCherre Robins MD;  [facility];  Service: Cardiovascular;  Laterality: Left;   AMPUTATION Left 07/09/2022   Procedure: PARTIAL LEFT FIFTH RAY AMPUTATION;  Surgeon: EEdrick Kins DPM;  [facility]  Service: Podiatry;  Laterality: Left;   BIOPSY  07/21/2022   Procedure: BIOPSY;  Surgeon: SWilford Corner MD;  [facility]  Service: Gastroenterology;;   BVilinda BlanksGRAFT FEMORAL-PERONEAL Left 07/07/2022   Procedure: LEFT FEMORAL-PERONEAL ARTERY BYPASS;  Surgeon: HCherre Robins MD;  [facility]  Service: Vascular;  Laterality: Left;   CARDIAC CATHETERIZATION  01/16/1999   Est. EF of 65% -- Nonobstruction atherosclerotic coronary artery disease -- Normal left ventricular function       CARDIAC ELECTROPHYSIOLOGY STUDY AND ABLATION  05/21/1999   Normal sinus funtion -- Mildly prolonged interatrial conduction times -- Normal A-V node funtion -- Normal His Purkinje system function -- fNo accessory pathway -- No inducible ventricular tachycardia in the  presence of the or in the absence of isoproterenol with programmed stimulation or with burst pacing -- Nikki Dom, M.D. [place]   CORONARY ARTERY BYPASS GRAFT  12/10/2011   Procedure: CORONARY ARTERY BYPASS GRAFTING (CABG);  Surgeon: Tharon Aquas Adelene Idler, MD;  Location: [place];  Service: Open Heart Surgery;  Laterality: N/A;  Coronary Artery bypass graft on pump times four utilizing left internal mammary artery and left saphenous vein harvested endoscopically   ESOPHAGOGASTRODUODENOSCOPY N/A 07/21/2022   Procedure: ESOPHAGOGASTRODUODENOSCOPY (EGD);  Surgeon: Wilford Corner, MD;  Location: [place];  Service: Gastroenterology;  Laterality: N/A;   LEFT HEART CATH AND CORS/GRAFTS ANGIOGRAPHY N/A 12/18/2018   Procedure: LEFT HEART CATH AND CORS/GRAFTS ANGIOGRAPHY;  Surgeon: Belva Crome, MD;  Location: [place] [facility];  Service: Cardiovascular;  Laterality: N/A;   LEFT HEART CATHETERIZATION WITH CORONARY ANGIOGRAM N/A 12/09/2011   Procedure: LEFT HEART CATHETERIZATION WITH CORONARY ANGIOGRAM;  Surgeon: Peter M Martinique, MD;  Location: [facility] C[facility];  Service: Cardiovascular;  Laterality: N/A;   LOWER EXTREMITY ANGIOGRAM Left 07/28/2018   Procedure: Lower Extremity Angiogram;  Surgeon: Angelia Mould, MD;  Location: [place] [facility];  Service: Cardiovascular;  Laterality: Left;    Social History   Socioeconomic History   Marital status: Widowed    Spouse name: Not on file   Number of children: 1   Years of education: Not on file   Highest education level: Not on file  Occupational History   Occupation: Retail buyer    Comment: retired  Tobacco Use   Smoking status: Former    Packs/day: 1.50    Years: 20.00    Total pack years: 30.00    Types: Cigarettes    Quit date: 05/27/1979    Years since quitting: 43.6   Smokeless tobacco: Former  Scientific laboratory technician Use: Never used  Substance and Sexual Activity   Alcohol use: Yes    Alcohol/week: 4.0 standard drinks of alcohol    Types: 4 Shots of liquor per week    Comment: 4 "canadian clubs each night"   Drug use: No   Sexual activity: Yes  Other Topics Concern   Not on file  Social History Narrative   Not on file   Social Determinants of Health   Financial Resource Strain: Not on file  Food Insecurity: No Food Insecurity (07/01/2022)   Hunger Vital Sign    Worried About Running Out of Food in the Last Year: Never true    Ran Out of Food in the Last Year: Never true  Transportation Needs: No Transportation Needs (07/01/2022)   PRAPARE - Hydrologist  (Medical): No    Lack of Transportation (Non-Medical): No  Physical Activity: Not on file  Stress: Not on file  Social Connections: Not on file  Intimate Partner Violence: Not At Risk (07/01/2022)   Humiliation, Afraid, Rape, and Kick questionnaire    Fear of Current or Ex-Partner: No    Emotionally Abused: No    Physically Abused: No    Sexually Abused: No    Family History  Problem Relation Age of Onset   Stroke Mother 29       Cerebellar hemorrhage    Current Outpatient Medications  Medication Sig Dispense Refill   acetaminophen (TYLENOL) 500 MG tablet Take 500 mg by mouth every 6 (six) hours as needed for moderate pain.  blood glucose meter kit and supplies Dispense based on patient and insurance preference. Use up to four times daily as directed. (FOR ICD-10 E10.9, E11.9). 1 each 1   Cholecalciferol (VITAMIN D3) 50 MCG (2000 UT) capsule Take 2,000 Units by mouth daily.     doxazosin (CARDURA) 2 MG tablet Take 2 mg by mouth daily.     doxycycline (VIBRA-TABS) 100 MG tablet Take 1 tablet (100 mg total) by mouth every 12 (twelve) hours.     ELIQUIS 2.5 MG TABS tablet Take 2.5 mg by mouth 2 (two) times daily.     ezetimibe (ZETIA) 10 MG tablet Take 1 tablet (10 mg total) by mouth daily. 90 tablet 3   furosemide (LASIX) 40 MG tablet Take 1 tablet (40 mg total) by mouth daily. 30 tablet    gentamicin cream (GARAMYCIN) 0.1 % Apply 1 Application topically 2 (two) times daily. 30 g 1   insulin aspart (NOVOLOG) 100 UNIT/ML FlexPen Before each meal 3 times a day, 140-199 - 2 units, 200-250 - 4 units, 251-299 - 6 units,  300-349 - 8 units,  350 or above 10 units. Insulin PEN if approved, provide syringes and needles if needed.Please switch to any approved short acting Insulin if needed. 15 mL 0   Insulin Syringe-Needle U-100 (INSULIN SYRINGE .3CC/31GX5/16") 31G X 5/16" 0.3 ML MISC Use with insulin 100 each 2   isosorbide mononitrate (IMDUR) 30 MG 24 hr tablet Take 1 tablet (30 mg total) by  mouth daily. 30 tablet 0   metFORMIN (GLUCOPHAGE) 1000 MG tablet Take 1,000 mg by mouth at bedtime.     metoprolol succinate (TOPROL-XL) 100 MG 24 hr tablet Take 1 tablet (100 mg total) by mouth daily. Take with or immediately following a meal. (Patient taking differently: Take 100 mg by mouth daily with supper. Take with or immediately following a meal.) 30 tablet 3   pantoprazole (PROTONIX) 40 MG tablet Take 1 tablet (40 mg total) by mouth 2 (two) times daily.     polyethylene glycol (MIRALAX / GLYCOLAX) 17 g packet Take 17 g by mouth daily as needed for mild constipation. 14 each 0   pravastatin (PRAVACHOL) 40 MG tablet Take 40 mg by mouth at bedtime.     spironolactone (ALDACTONE) 25 MG tablet Take 0.5 tablets (12.5 mg total) by mouth daily.     vitamin B-12 (CYANOCOBALAMIN) 500 MCG tablet Take 500 mcg by mouth daily with breakfast.      No current facility-administered medications for this visit.    Allergies  Allergen Reactions   Januvia [Sitagliptin] Other (See Comments)    Soreness to stomach area. Intolerance    Statins Other (See Comments)    Myalgias, muscle weakness, swelling, pain- prescribed Lipitor in 06/2022 (??)     REVIEW OF SYSTEMS:  '[X]'$  denotes positive finding, '[ ]'$  denotes negative finding Cardiac  Comments:  Chest pain or chest pressure:    Shortness of breath upon exertion:    Short of breath when lying flat:    Irregular heart rhythm:        Vascular    Pain in calf, thigh, or hip brought on by ambulation:    Pain in feet at night that wakes you up from your sleep:     Blood clot in your veins:    Leg swelling:  X       Pulmonary    Oxygen at home:    Productive cough:     Wheezing:  Neurologic    Sudden weakness in arms or legs:     Sudden numbness in arms or legs:     Sudden onset of difficulty speaking or slurred speech:    Temporary loss of vision in one eye:     Problems with dizziness:         Gastrointestinal    Blood in stool:      Vomited blood:         Genitourinary    Burning when urinating:     Blood in urine:        Psychiatric    Major depression:         Hematologic    Bleeding problems:    Problems with blood clotting too easily:        Skin    Rashes or ulcers:        Constitutional    Fever or chills:      PHYSICAL EXAMINATION:  Vitals:   01/05/23 1047  BP: (!) 128/59  Pulse: 71  Temp: 97.6 F (36.4 C)  TempSrc: Temporal  SpO2: 99%  Weight: 186 lb (84.4 kg)    General:  WDWN in NAD; vital signs documented above Gait: uses rolling walker HENT: WNL, normocephalic Pulmonary: normal non-labored breathing, without wheezing Cardiac: regular HR, without  Murmurs without carotid bruit Abdomen: soft Vascular Exam/Pulses: 2+ femoral pulses bilaterally, no palpable distal pulses Bilateral lower extremities edematous Extremities: with ischemic changes, without Gangrene , without cellulitis; with open wound on left great toe and dorsum of sleep. Right 3rd toe dorsal small ulceration Musculoskeletal: no muscle wasting or atrophy  Neurologic: A&O X 3;  No focal weakness or paresthesias are detected Psychiatric:  The pt has Normal affect.   Non-Invasive Vascular Imaging:   +----------+--------+-----+---------------+----------+---------+  LEFT     PSV cm/sRatioStenosis       Waveform  Comments   +----------+--------+-----+---------------+----------+---------+  CFA Distal136                         triphasic            +----------+--------+-----+---------------+----------+---------+  SFA Prox  89                          triphasic Turbulent  +----------+--------+-----+---------------+----------+---------+  SFA Mid   201          50-74% stenosistriphasic Turbulent  +----------+--------+-----+---------------+----------+---------+  SFA Distal45                          monophasic           +----------+--------+-----+---------------+----------+---------+  POP  Prox               occluded                            +----------+--------+-----+---------------+----------+---------+  POP Mid                occluded                            +----------+--------+-----+---------------+----------+---------+  POP Distal             occluded                            +----------+--------+-----+---------------+----------+---------+  ATA Distal27  monophasic           +----------+--------+-----+---------------+----------+---------+  PTA Distal28                          monophasic           +----------+--------+-----+---------------+----------+---------+     Left Graft #1: Left femoral-Peroneal artery bypass  +--------------------+--------+--------+----------+------------------+                     PSV cm/sStenosisWaveform  Comments            +--------------------+--------+--------+----------+------------------+  Inflow             136             triphasic                     +--------------------+--------+--------+----------+------------------+  Proximal Anastomosis28      occluded          bidirectional flow  +--------------------+--------+--------+----------+------------------+  Proximal Graft              occluded                              +--------------------+--------+--------+----------+------------------+  Mid Graft                   occluded                              +--------------------+--------+--------+----------+------------------+  Distal Graft                occluded                              +--------------------+--------+--------+----------+------------------+  Distal Anastomosis          occluded                              +--------------------+--------+--------+----------+------------------+  Outflow            18              monophasic                     +--------------------+--------+--------+----------+------------------+   Occluded Left fem-peroneal bypass.   Summary:  Left: 50-74% stenosis noted in the superficial femoral artery. Total occlusion noted in the popliteal artery. Occluded Left fem-peroneal bypass.   +-------+-----------+-----------+------------+------------+  ABI/TBIToday's ABIToday's TBIPrevious ABIPrevious TBI  +-------+-----------+-----------+------------+------------+  Right [place]         0                    0.30          +-------+-----------+-----------+------------+------------+  Left  0.50       Bandage    0.38        0.10          +-------+-----------+-----------+------------+------------+    ASSESSMENT/PLAN:: 82 y.o. male here for follow up for PAD. He has new wounds on his left foot and also some ulcerations on his right toes. He additionally has been having significant rest pain in his left leg for several months now. Unfortunately his duplex shows occlusion of his femoral to peroneal PTFE bypass.  - He understands that he is very high risk for amputation of his LLE  given his wounds and limited revascularization options with single peroneal runoff - I have recommended Angiogram of BLE to evaluate to see if he has any bypass redo options for LLE and also to evaluate perfusion of RLE - He needs 72 hour notice for his transportation and he is also on Eliquis which will need to be held - I will arrange for Aortogram, Arteriogram of BLE with Dr. Donzetta Matters on Monday 01/10/23   Karoline Caldwell, PA-C [facility] [phone]  Clinic MD: Yetta Barre

## 2023-01-05 NOTE — H&P (View-Only) (Signed)
Office Note     CC:  follow up Requesting Provider:  No ref. provider found  HPI: [name] Mccamish Jr. is a 81 y.o. (10/12/1941) male who presents for follow up of PAD. He is s/p  Left common femoral - peroneal artery bypass with 6mm PTFE in subcutaneous tunnel by Dr. Hawken on 07/07/22 for atherosclerosis of native arteries of left lower extremity causing ulceration of left 4th toe. This was followed by Partial fifth ray amputation left foot by Dr. Brent M. Evans DPM on 07/09/22. His 4th toe subsequently healed however he reports that starting in October he started to have significant pain in his left foot and since has developed wounds on his foot. He also has some wounds on his right toes as well. These have been taken care of by the wound care nurses at his assisted living facility. He says that he has had trouble sleeping at night and only relief he gets is if he sleeps in his recliner with his feet in dependent position on the floor. He does have significant swelling in both of his legs but he says he is unable to elevate his legs because it causes them to hurt too much. He otherwise says he gets around fairly well. He uses rolling walker to ambulate. He says he did not follow up earlier because he says he has just been dealing with the pain and figured his nurses would tell him he needed to if there was any concern.  The pt is on a statin for cholesterol management.  The pt  on a daily aspirin.   Other AC:  Eliquis The pt is on BB for hypertension.   The pt is diabetic Tobacco hx:  Former  Past Medical History:  Diagnosis Date   CAD (coronary artery disease)    a. s/p CABG x 29 Nov 2011 with LIMA to LAD, SVG to OM1 and distal LCX, SVG to ramus intermediate   Essential hypertension    Hyperlipidemia    Intermittent claudication (HCC)    Ischemic cardiomyopathy    a. 03/2015 Echo: EF45-50%, Gr1 DD, mild MR.   PAF (paroxysmal atrial fibrillation) (HCC)    a. post op atrial fib 11/2011; short  course of amiodarone; stopped 01/25/12   Peripheral arterial disease (HCC)    Right bundle branch block    Type II diabetes mellitus (HCC)     Past Surgical History:  Procedure Laterality Date   ABDOMINAL AORTOGRAM W/LOWER EXTREMITY Left 07/02/2022   Procedure: ABDOMINAL AORTOGRAM W/LOWER EXTREMITY;  Surgeon: Hawken, Thomas N, MD;  Location: [facility];  Service: Cardiovascular;  Laterality: Left;   AMPUTATION Left 07/09/2022   Procedure: PARTIAL LEFT FIFTH RAY AMPUTATION;  Surgeon: Evans, Brent M, DPM;  Location: [facility];  Service: Podiatry;  Laterality: Left;   BIOPSY  07/21/2022   Procedure: BIOPSY;  Surgeon: Schooler, Vincent, MD;  Location: [facility];  Service: Gastroenterology;;   BYPASS GRAFT FEMORAL-PERONEAL Left 07/07/2022   Procedure: LEFT FEMORAL-PERONEAL ARTERY BYPASS;  Surgeon: Hawken, Thomas N, MD;  Location: [facility];  Service: Vascular;  Laterality: Left;   CARDIAC CATHETERIZATION  01/16/1999   Est. EF of 65% -- Nonobstruction atherosclerotic coronary artery disease -- Normal left ventricular function       CARDIAC ELECTROPHYSIOLOGY STUDY AND ABLATION  05/21/1999   Normal sinus funtion -- Mildly prolonged interatrial conduction times -- Normal A-V node funtion -- Normal His Purkinje system function -- fNo accessory pathway -- No inducible ventricular tachycardia in the   presence of the or in the absence of isoproterenol with programmed stimulation or with burst pacing -- Steven Cochran Klein, M.D. FACC surgan           CHOLECYSTECTOMY  1999   CORONARY ARTERY BYPASS GRAFT  12/10/2011   Procedure: CORONARY ARTERY BYPASS GRAFTING (CABG);  Surgeon: Peter [name], MD;  Location: [facility];  Service: Open Heart Surgery;  Laterality: N/A;  Coronary Artery bypass graft on pump times four utilizing left internal mammary artery and left saphenous vein harvested endoscopically   ESOPHAGOGASTRODUODENOSCOPY N/A 07/21/2022   Procedure: ESOPHAGOGASTRODUODENOSCOPY (EGD);  Surgeon: Schooler,  Vincent, MD;  [facility]: [facility];  Service: Gastroenterology;  Laterality: N/A;   LEFT HEART CATH AND CORS/GRAFTS ANGIOGRAPHY N/A 12/18/2018   Procedure: LEFT HEART CATH AND CORS/GRAFTS ANGIOGRAPHY;  Surgeon: Smith, Henry W, MD;  [facility]: [facility];  Service: Cardiovascular;  Laterality: N/A;   LEFT HEART CATHETERIZATION WITH CORONARY ANGIOGRAM N/A 12/09/2011   Procedure: LEFT HEART CATHETERIZATION WITH CORONARY ANGIOGRAM;  Surgeon: Peter M Jordan, MD;  Location: [facility];  Service: Cardiovascular;  Laterality: N/A;   LOWER EXTREMITY ANGIOGRAM Left 07/28/2018   Procedure: Lower Extremity Angiogram;  Surgeon: Dickson, Christopher S, MD;  [facility]: [facility];  Service: Cardiovascular;  Laterality: Left;    Social History   Socioeconomic History   Marital status: Widowed    Spouse name: Not on file   Number of children: 1   Years of education: Not on file   Highest education level: Not on file  Occupational History   Occupation: mailroom manager    Comment: retired  Tobacco Use   Smoking status: Former    Packs/day: 1.50    Years: 20.00    Total pack years: 30.00    Types: Cigarettes    Quit date: 05/27/1979    Years since quitting: 43.6   Smokeless tobacco: Former  Vaping Use   Vaping Use: Never used  Substance and Sexual Activity   Alcohol use: Yes    Alcohol/week: 4.0 standard drinks of alcohol    Types: 4 Shots of liquor per week    Comment: 4 "canadian clubs each night"   Drug use: No   Sexual activity: Yes  Other Topics Concern   Not on file  Social History Narrative   Not on file   Social Determinants of Health   Financial Resource Strain: Not on file  Food Insecurity: No Food Insecurity (07/01/2022)   Hunger Vital Sign    Worried About Running Out of Food in the Last Year: Never true    Ran Out of Food in the Last Year: Never true  Transportation Needs: No Transportation Needs (07/01/2022)   PRAPARE - Transportation    Lack of Transportation  (Medical): No    Lack of Transportation (Non-Medical): No  Physical Activity: Not on file  Stress: Not on file  Social Connections: Not on file  Intimate Partner Violence: Not At Risk (07/01/2022)   Humiliation, Afraid, Rape, and Kick questionnaire    Fear of Current or Ex-Partner: No    Emotionally Abused: No    Physically Abused: No    Sexually Abused: No    Family History  Problem Relation Age of Onset   Stroke Mother 34       Cerebellar hemorrhage    Current Outpatient Medications  Medication Sig Dispense Refill   acetaminophen (TYLENOL) 500 MG tablet Take 500 mg by mouth every 6 (six) hours as needed for moderate pain.       blood glucose meter kit and supplies Dispense based on patient and insurance preference. Use up to four times daily as directed. (FOR ICD-10 E10.9, E11.9). 1 each 1   Cholecalciferol (VITAMIN D3) 50 MCG (2000 UT) capsule Take 2,000 Units by mouth daily.     doxazosin (CARDURA) 2 MG tablet Take 2 mg by mouth daily.     doxycycline (VIBRA-TABS) 100 MG tablet Take 1 tablet (100 mg total) by mouth every 12 (twelve) hours.     ELIQUIS 2.5 MG TABS tablet Take 2.5 mg by mouth 2 (two) times daily.     ezetimibe (ZETIA) 10 MG tablet Take 1 tablet (10 mg total) by mouth daily. 90 tablet 3   furosemide (LASIX) 40 MG tablet Take 1 tablet (40 mg total) by mouth daily. 30 tablet    gentamicin cream (GARAMYCIN) 0.1 % Apply 1 Application topically 2 (two) times daily. 30 g 1   insulin aspart (NOVOLOG) 100 UNIT/ML FlexPen Before each meal 3 times a day, 140-199 - 2 units, 200-250 - 4 units, 251-299 - 6 units,  300-349 - 8 units,  350 or above 10 units. Insulin PEN if approved, provide syringes and needles if needed.Please switch to any approved short acting Insulin if needed. 15 mL 0   Insulin Syringe-Needle U-100 (INSULIN SYRINGE .3CC/31GX5/16") 31G X 5/16" 0.3 ML MISC Use with insulin 100 each 2   isosorbide mononitrate (IMDUR) 30 MG 24 hr tablet Take 1 tablet (30 mg total) by  mouth daily. 30 tablet 0   metFORMIN (GLUCOPHAGE) 1000 MG tablet Take 1,000 mg by mouth at bedtime.     metoprolol succinate (TOPROL-XL) 100 MG 24 hr tablet Take 1 tablet (100 mg total) by mouth daily. Take with or immediately following a meal. (Patient taking differently: Take 100 mg by mouth daily with supper. Take with or immediately following a meal.) 30 tablet 3   pantoprazole (PROTONIX) 40 MG tablet Take 1 tablet (40 mg total) by mouth 2 (two) times daily.     polyethylene glycol (MIRALAX / GLYCOLAX) 17 g packet Take 17 g by mouth daily as needed for mild constipation. 14 each 0   pravastatin (PRAVACHOL) 40 MG tablet Take 40 mg by mouth at bedtime.     spironolactone (ALDACTONE) 25 MG tablet Take 0.5 tablets (12.5 mg total) by mouth daily.     vitamin B-12 (CYANOCOBALAMIN) 500 MCG tablet Take 500 mcg by mouth daily with breakfast.      No current facility-administered medications for this visit.    Allergies  Allergen Reactions   Januvia [Sitagliptin] Other (See Comments)    Soreness to stomach area. Intolerance    Statins Other (See Comments)    Myalgias, muscle weakness, swelling, pain- prescribed Lipitor in 06/2022 (??)     REVIEW OF SYSTEMS:  [X] denotes positive finding, [ ] denotes negative finding Cardiac  Comments:  Chest pain or chest pressure:    Shortness of breath upon exertion:    Short of breath when lying flat:    Irregular heart rhythm:        Vascular    Pain in calf, thigh, or hip brought on by ambulation:    Pain in feet at night that wakes you up from your sleep:     Blood clot in your veins:    Leg swelling:  X       Pulmonary    Oxygen at home:    Productive cough:     Wheezing:           Neurologic    Sudden weakness in arms or legs:     Sudden numbness in arms or legs:     Sudden onset of difficulty speaking or slurred speech:    Temporary loss of vision in one eye:     Problems with dizziness:         Gastrointestinal    Blood in stool:      Vomited blood:         Genitourinary    Burning when urinating:     Blood in urine:        Psychiatric    Major depression:         Hematologic    Bleeding problems:    Problems with blood clotting too easily:        Skin    Rashes or ulcers:        Constitutional    Fever or chills:      PHYSICAL EXAMINATION:  Vitals:   01/05/23 1047  BP: (!) 128/59  Pulse: 71  Temp: 97.6 F (36.4 C)  TempSrc: Temporal  SpO2: 99%  Weight: 186 lb (84.4 kg)    General:  WDWN in NAD; vital signs documented above Gait: uses rolling walker HENT: WNL, normocephalic Pulmonary: normal non-labored breathing, without wheezing Cardiac: regular HR, without  Murmurs without carotid bruit Abdomen: soft Vascular Exam/Pulses: 2+ femoral pulses bilaterally, no palpable distal pulses Bilateral lower extremities edematous Extremities: with ischemic changes, without Gangrene , without cellulitis; with open wound on left great toe and dorsum of sleep. Right 3rd toe dorsal small ulceration Musculoskeletal: no muscle wasting or atrophy  Neurologic: A&O X 3;  No focal weakness or paresthesias are detected Psychiatric:  The pt has Normal affect.   Non-Invasive Vascular Imaging:   +----------+--------+-----+---------------+----------+---------+  LEFT     PSV cm/sRatioStenosis       Waveform  Comments   +----------+--------+-----+---------------+----------+---------+  CFA Distal136                         triphasic            +----------+--------+-----+---------------+----------+---------+  SFA Prox  89                          triphasic Turbulent  +----------+--------+-----+---------------+----------+---------+  SFA Mid   201          50-74% stenosistriphasic Turbulent  +----------+--------+-----+---------------+----------+---------+  SFA Distal45                          monophasic           +----------+--------+-----+---------------+----------+---------+  POP  Prox               occluded                            +----------+--------+-----+---------------+----------+---------+  POP Mid                occluded                            +----------+--------+-----+---------------+----------+---------+  POP Distal             occluded                            +----------+--------+-----+---------------+----------+---------+  ATA Distal27                            monophasic           +----------+--------+-----+---------------+----------+---------+  PTA Distal28                          monophasic           +----------+--------+-----+---------------+----------+---------+     Left Graft #1: Left femoral-Peroneal artery bypass  +--------------------+--------+--------+----------+------------------+                     PSV cm/sStenosisWaveform  Comments            +--------------------+--------+--------+----------+------------------+  Inflow             136             triphasic                     +--------------------+--------+--------+----------+------------------+  Proximal Anastomosis28      occluded          bidirectional flow  +--------------------+--------+--------+----------+------------------+  Proximal Graft              occluded                              +--------------------+--------+--------+----------+------------------+  Mid Graft                   occluded                              +--------------------+--------+--------+----------+------------------+  Distal Graft                occluded                              +--------------------+--------+--------+----------+------------------+  Distal Anastomosis          occluded                              +--------------------+--------+--------+----------+------------------+  Outflow            18              monophasic                     +--------------------+--------+--------+----------+------------------+   Occluded Left fem-peroneal bypass.   Summary:  Left: 50-74% stenosis noted in the superficial femoral artery. Total occlusion noted in the popliteal artery. Occluded Left fem-peroneal bypass.   +-------+-----------+-----------+------------+------------+  ABI/TBIToday's ABIToday's TBIPrevious ABIPrevious TBI  +-------+-----------+-----------+------------+------------+  Right [place]         0          [place]          0.30          +-------+-----------+-----------+------------+------------+  Left  0.50       Bandage    0.38        0.10          +-------+-----------+-----------+------------+------------+    ASSESSMENT/PLAN:: 81 y.o. male here for follow up for PAD. He has new wounds on his left foot and also some ulcerations on his right toes. He additionally has been having significant rest pain in his left leg for several months now. Unfortunately his duplex shows occlusion of his femoral to peroneal PTFE bypass.  - He understands that he is very high risk for amputation of his LLE   given his wounds and limited revascularization options with single peroneal runoff - I have recommended Angiogram of BLE to evaluate to see if he has any bypass redo options for LLE and also to evaluate perfusion of RLE - He needs 72 hour notice for his transportation and he is also on Eliquis which will need to be held - I will arrange for Aortogram, Arteriogram of BLE with Dr. Cain on Monday 01/10/23   Alexie Samson, PA-C [facility] [phone]  Clinic MD: Cain/ Dickson 

## 2023-01-10 ENCOUNTER — Other Ambulatory Visit: Payer: Self-pay

## 2023-01-10 ENCOUNTER — Encounter (HOSPITAL_COMMUNITY)
Admission: RE | Disposition: A | Discharge status: Home / Self Care | Payer: Self-pay | Source: Ambulatory Visit | Attending: Vascular Surgery

## 2023-01-10 ENCOUNTER — Observation Stay (HOSPITAL_COMMUNITY)
Admission: RE | Admit: 2023-01-10 | Discharge: 2023-01-11 | Disposition: A | Discharge status: Home / Self Care | Payer: Medicare Other | Source: Ambulatory Visit | Attending: Vascular Surgery | Admitting: Vascular Surgery

## 2023-01-10 DIAGNOSIS — E119 Type 2 diabetes mellitus without complications: Secondary | ICD-10-CM | POA: Insufficient documentation

## 2023-01-10 DIAGNOSIS — I480 Paroxysmal atrial fibrillation: Secondary | ICD-10-CM | POA: Diagnosis not present

## 2023-01-10 DIAGNOSIS — I2510 Atherosclerotic heart disease of native coronary artery without angina pectoris: Secondary | ICD-10-CM | POA: Insufficient documentation

## 2023-01-10 DIAGNOSIS — I70249 Atherosclerosis of native arteries of left leg with ulceration of unspecified site: Secondary | ICD-10-CM | POA: Diagnosis not present

## 2023-01-10 DIAGNOSIS — Z951 Presence of aortocoronary bypass graft: Secondary | ICD-10-CM | POA: Diagnosis not present

## 2023-01-10 DIAGNOSIS — I70221 Atherosclerosis of native arteries of extremities with rest pain, right leg: Secondary | ICD-10-CM | POA: Diagnosis not present

## 2023-01-10 DIAGNOSIS — Z79899 Other long term (current) drug therapy: Secondary | ICD-10-CM | POA: Insufficient documentation

## 2023-01-10 DIAGNOSIS — I739 Peripheral vascular disease, unspecified: Secondary | ICD-10-CM | POA: Diagnosis not present

## 2023-01-10 DIAGNOSIS — I70223 Atherosclerosis of native arteries of extremities with rest pain, bilateral legs: Principal | ICD-10-CM | POA: Insufficient documentation

## 2023-01-10 DIAGNOSIS — Z87891 Personal history of nicotine dependence: Secondary | ICD-10-CM | POA: Diagnosis not present

## 2023-01-10 DIAGNOSIS — Z7984 Long term (current) use of oral hypoglycemic drugs: Secondary | ICD-10-CM | POA: Diagnosis not present

## 2023-01-10 DIAGNOSIS — T82599A Other mechanical complication of unspecified cardiac and vascular devices and implants, initial encounter: Secondary | ICD-10-CM

## 2023-01-10 DIAGNOSIS — Z794 Long term (current) use of insulin: Secondary | ICD-10-CM | POA: Diagnosis not present

## 2023-01-10 DIAGNOSIS — Z7901 Long term (current) use of anticoagulants: Secondary | ICD-10-CM | POA: Insufficient documentation

## 2023-01-10 DIAGNOSIS — I7035 Atherosclerosis of unspecified type of bypass graft(s) of other extremity with ulceration: Secondary | ICD-10-CM

## 2023-01-10 HISTORY — PX: ABDOMINAL AORTOGRAM W/LOWER EXTREMITY: CATH118223

## 2023-01-10 LAB — POCT I-STAT, CHEM 8
BUN: 20 mg/dL (ref 8–23)
Calcium, Ion: 1.23 mmol/L (ref 1.15–1.40)
Chloride: 103 mmol/L (ref 98–111)
Creatinine, Ser: 1.9 mg/dL — ABNORMAL HIGH (ref 0.61–1.24)
Glucose, Bld: 98 mg/dL (ref 70–99)
HCT: 29 % — ABNORMAL LOW (ref 39.0–52.0)
Hemoglobin: 9.9 g/dL — ABNORMAL LOW (ref 13.0–17.0)
Potassium: 3.8 mmol/L (ref 3.5–5.1)
Sodium: 137 mmol/L (ref 135–145)
TCO2: 20 mmol/L — ABNORMAL LOW (ref 22–32)

## 2023-01-10 LAB — GLUCOSE, CAPILLARY: Glucose-Capillary: 135 mg/dL — ABNORMAL HIGH (ref 70–99)

## 2023-01-10 SURGERY — ABDOMINAL AORTOGRAM W/LOWER EXTREMITY
Anesthesia: LOCAL

## 2023-01-10 MED ORDER — IODIXANOL 320 MG/ML IV SOLN
INTRAVENOUS | Status: DC | PRN
  Administered 2023-01-10: 25 mL

## 2023-01-10 MED ORDER — SODIUM CHLORIDE 0.9 % IV SOLN: INTRAVENOUS | Status: DC

## 2023-01-10 MED ORDER — LABETALOL HCL 5 MG/ML IV SOLN: 10.0000 mg | INTRAVENOUS | Status: DC | PRN

## 2023-01-10 MED ORDER — HEPARIN SODIUM (PORCINE) 1000 UNIT/ML IJ SOLN
INTRAMUSCULAR | Status: AC
  Filled 2023-01-10: qty 10

## 2023-01-10 MED ORDER — HYDROCODONE-ACETAMINOPHEN 5-325 MG PO TABS
1.0000 | ORAL_TABLET | Freq: Once | ORAL | Status: AC
  Administered 2023-01-10: 1 via ORAL
  Filled 2023-01-10: qty 1

## 2023-01-10 MED ORDER — HYDRALAZINE HCL 20 MG/ML IJ SOLN
INTRAMUSCULAR | Status: AC
  Filled 2023-01-10: qty 1

## 2023-01-10 MED ORDER — FENTANYL CITRATE (PF) 100 MCG/2ML IJ SOLN
INTRAMUSCULAR | Status: DC | PRN
  Administered 2023-01-10 (×2): 50 ug via INTRAVENOUS

## 2023-01-10 MED ORDER — HYDRALAZINE HCL 20 MG/ML IJ SOLN: 5.0000 mg | INTRAMUSCULAR | Status: DC | PRN

## 2023-01-10 MED ORDER — SODIUM CHLORIDE 0.9% FLUSH: 3.0000 mL | INTRAVENOUS | Status: DC | PRN

## 2023-01-10 MED ORDER — HEPARIN SODIUM (PORCINE) 1000 UNIT/ML IJ SOLN
INTRAMUSCULAR | Status: DC | PRN
  Administered 2023-01-10: 9000 [IU] via INTRAVENOUS

## 2023-01-10 MED ORDER — LIDOCAINE HCL (PF) 1 % IJ SOLN
INTRAMUSCULAR | Status: DC | PRN
  Administered 2023-01-10: 15 mL

## 2023-01-10 MED ORDER — MIDAZOLAM HCL 2 MG/2ML IJ SOLN
INTRAMUSCULAR | Status: DC | PRN
  Administered 2023-01-10: 1 mg via INTRAVENOUS

## 2023-01-10 MED ORDER — SODIUM CHLORIDE 0.9 % IV SOLN: 250.0000 mL | INTRAVENOUS | Status: DC | PRN

## 2023-01-10 MED ORDER — HYDRALAZINE HCL 20 MG/ML IJ SOLN
INTRAMUSCULAR | Status: DC | PRN
  Administered 2023-01-10: 10 mg via INTRAVENOUS

## 2023-01-10 MED ORDER — MIDAZOLAM HCL 2 MG/2ML IJ SOLN
INTRAMUSCULAR | Status: AC
  Filled 2023-01-10: qty 2

## 2023-01-10 MED ORDER — SODIUM CHLORIDE 0.9% FLUSH
3.0000 mL | Freq: Two times a day (BID) | INTRAVENOUS | Status: DC
  Administered 2023-01-11: 3 mL via INTRAVENOUS

## 2023-01-10 MED ORDER — SODIUM CHLORIDE 0.9 % WEIGHT BASED INFUSION: 1.0000 mL/kg/h | INTRAVENOUS | Status: AC

## 2023-01-10 MED ORDER — ACETAMINOPHEN 325 MG PO TABS: 650.0000 mg | ORAL_TABLET | ORAL | Status: DC | PRN

## 2023-01-10 MED ORDER — LIDOCAINE HCL (PF) 1 % IJ SOLN
INTRAMUSCULAR | Status: AC
  Filled 2023-01-10: qty 30

## 2023-01-10 MED ORDER — FENTANYL CITRATE (PF) 100 MCG/2ML IJ SOLN
INTRAMUSCULAR | Status: AC
  Filled 2023-01-10: qty 2

## 2023-01-10 MED ORDER — MORPHINE SULFATE (PF) 2 MG/ML IV SOLN: 2.0000 mg | INTRAVENOUS | Status: DC | PRN

## 2023-01-10 MED ORDER — OXYCODONE HCL 5 MG PO TABS
5.0000 mg | ORAL_TABLET | ORAL | Status: DC | PRN
  Administered 2023-01-10 – 2023-01-11 (×2): 10 mg via ORAL
  Filled 2023-01-10 (×2): qty 2

## 2023-01-10 MED ORDER — HEPARIN (PORCINE) IN NACL 1000-0.9 UT/500ML-% IV SOLN
INTRAVENOUS | Status: DC | PRN
  Administered 2023-01-10 (×2): 500 mL

## 2023-01-10 SURGICAL SUPPLY — 20 items
CATH NAVICROSS ST .035X135CM (MICROCATHETER) IMPLANT
CATH OMNI FLUSH 5F 65CM (CATHETERS) IMPLANT
CATH QUICKCROSS .035X135CM (MICROCATHETER) IMPLANT
CATH QUICKCROSS SUPP .035X90CM (MICROCATHETER) IMPLANT
CLOSURE MYNX CONTROL 6F/7F (Vascular Products) IMPLANT
DEVICE TORQUE .025-.038 (MISCELLANEOUS) IMPLANT
GLIDEWIRE ADV .035X260CM (WIRE) IMPLANT
GUIDEWIRE ANGLED .035X260CM (WIRE) IMPLANT
KIT ANGIASSIST CO2 SYSTEM (KITS) IMPLANT
KIT MICROPUNCTURE NIT STIFF (SHEATH) IMPLANT
KIT PV (KITS) ×1 IMPLANT
SHEATH CATAPULT 6FR 60 (SHEATH) IMPLANT
SHEATH PINNACLE 5F 10CM (SHEATH) IMPLANT
SHEATH PINNACLE 6F 10CM (SHEATH) IMPLANT
SHEATH PINNACLE 7F 10CM (SHEATH) IMPLANT
SHEATH PROBE COVER 6X72 (BAG) IMPLANT
STOPCOCK MORSE 400PSI 3WAY (MISCELLANEOUS) IMPLANT
TRANSDUCER W/STOPCOCK (MISCELLANEOUS) ×1 IMPLANT
TRAY PV CATH (CUSTOM PROCEDURE TRAY) ×1 IMPLANT
WIRE STARTER BENTSON 035X150 (WIRE) IMPLANT

## 2023-01-10 NOTE — Op Note (Signed)
Patient name: Steve [name]. 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 Donzetta Matters, MD Procedure Performed: 1.  Ultrasound-guided cannulation right common femoral artery 2.  CO2 aortogram and right lower extremity angiogram and left lower extremity angiogram with contrast used below the knee and the left lower extremity 3.  Attempted crossing left SFA occlusion 4.  Moderate sedation with fentanyl and Versed for 55 minutes 5.  Mynx device closure right common femoral artery    Indications: 82 year old male with history of a left femoral to peroneal artery bypass with PTFE last September.  This is now known to be occluded after he healed a left fifth toe amputation.  He now has a wound at the left first metatarsal head also has rest pain in the right lower extremity and elevated creatinine to 1.9 on arrival and is indicated for CO2 angiogram with possible intervention.  Findings: The aorta and iliac segments are heavily calcified although free of flow-limiting stenosis.  On the right side there is at least 50% stenosis of the right common femoral artery and the SFA is occluded he could not tolerate CO2 to evaluate for any reconstitution but was heavily calcified throughout.  On the left side the SFA is calcified but patent until the adductor canal where it is flush occluded and reconstitutes below the knee popliteal island and there is a very distal thread of the peroneal artery reconstituted as well.  Attempted to cross the heavily calcified SFA but was unable to get any catheter to pass and ultimately given his significant disease and long segment occlusions with wound I elected to terminate the procedure.  Patient will need left above-knee amputation for wound possibly right above-knee amputation for rest  pain.   Procedure:  The patient was identified in the holding area and taken to room 8.  The patient was then placed supine on the table and prepped and draped in the usual sterile fashion.  A time out was called.  Ultrasound was used to evaluate the right common femoral artery which was heavily calcified.  The area was anesthetized with 1% lidocaine and cannulated with a micropuncture needle followed by wire and a sheath.  And images saved to the permanent record.  We placed a Bentson wire followed by 5 [place] sheath and Omni catheter to the level of L1 performed CO2 aortogram.  I then crossed the bifurcation with Omni cath and ultimately used Glidewire and quick cross catheter to cross and perform left lower extremity angiography.  I then placed a quick cross catheter down distally in the SFA and attempted contrast angiography which demonstrated the popliteal Allen possibly a distal peroneal artery reconstitution.  I then placed a stiff wire and a 6 French sheath up and over the bifurcation patient was given 9000 units of heparin.  I then attempted to cross the occluded SFA which was heavily calcified using both Glidewire, Glidewire advantage and the back of a Glidewire and I was able to get approximately 6 cm into the occluded area but no catheter including Navi cross would pass.  Patient was having significant pain on the table and I elected that attempted crossing was futile.  I removed the catheter wire and exchanged for a short 6 [place] sheath.  I then performed a limited right lower extremity angiography with CO2 but there was no  reconstitution identified and patient did not tolerate the CO2 well.  With this I placed a minx device and deployed which deployed well.  There was a small hematoma in the right groin but was not expanding.  Patient will be discharged today with plans for discussion of above-knee amputation in the office in the near future.   Contrast: 25cc  Stpehen Petitjean C. Donzetta Matters, MD [facility] [place] Office: [phone] Pager: [phone]

## 2023-01-10 NOTE — Progress Notes (Signed)
Pt transferred to [place] room 2 via personal wheelchair with belongings. Report called to [facility]

## 2023-01-10 NOTE — Interval H&P Note (Signed)
History and Physical Interval Note:  01/10/2023 12:29 PM  Steve [name].  has presented today for surgery, with the diagnosis of pad w/ ulcer - acculated left lower extremity bypass graft.  The various methods of treatment have been discussed with the patient and family. After consideration of risks, benefits and other options for treatment, the patient has consented to  Procedure(s): ABDOMINAL AORTOGRAM W/LOWER EXTREMITY (N/A) as a surgical intervention.  The patient's history has been reviewed, patient examined, no change in status, stable for surgery.  I have reviewed the patient's chart and labs.  Questions were answered to the patient's satisfaction.     Servando Snare

## 2023-01-10 NOTE — Progress Notes (Signed)
Pt ambulated to and from bathroom to void with no signs of oozing from groin site  

## 2023-01-10 NOTE — Progress Notes (Signed)
Notified Eliezer Champagne of creat 1.9.

## 2023-01-11 ENCOUNTER — Encounter (HOSPITAL_COMMUNITY): Payer: Self-pay | Admitting: Vascular Surgery

## 2023-01-11 DIAGNOSIS — I70223 Atherosclerosis of native arteries of extremities with rest pain, bilateral legs: Secondary | ICD-10-CM | POA: Diagnosis not present

## 2023-01-11 NOTE — Discharge Instructions (Signed)
° °  [facility]  ° °Discharge Instructions ° °Lower Extremity Angiogram; Angioplasty/Stenting ° °Please refer to the following instructions for your post-procedure care. Your surgeon or physician assistant will discuss any changes with you. ° °Activity ° °Avoid lifting more than 8 pounds (1 gallons of milk) for 72 hours (3 days) after your procedure. You may walk as much as you can tolerate. It's OK to drive after 72 hours. ° °Bathing/Showering ° °You may shower the day after your procedure. If you have a bandage, you may remove it at 24- 48 hours. Clean your incision site with mild soap and water. Pat the area dry with a clean towel. ° °Diet ° °Resume your pre-procedure diet. There are no special food restrictions following this procedure. All patients with peripheral vascular disease should follow a low fat/low cholesterol diet. In order to heal from your surgery, it is CRITICAL to get adequate nutrition. Your body requires vitamins, minerals, and protein. Vegetables are the best source of vitamins and minerals. Vegetables also provide the perfect balance of protein. Processed food has little nutritional value, so try to avoid this. ° °Medications ° °Resume taking all of your medications unless your doctor tells you not to. If your incision is causing pain, you may take over-the-counter pain relievers such as acetaminophen (Tylenol) ° °Follow Up ° °Follow up will be arranged at the time of your procedure. You may have an office visit scheduled or may be scheduled for surgery. Ask your surgeon if you have any questions. ° °Please call us immediately for any of the following conditions: °•Severe or worsening pain your legs or feet at rest or with walking. °•Increased pain, redness, drainage at your groin puncture site. °•Fever of 101 degrees or higher. °•If you have any mild or slow bleeding from your puncture site: lie down, apply firm constant pressure over the area with a piece of  gauze or a clean wash cloth for 30 minutes- no peeking!, call 911 right away if you are still bleeding after 30 minutes, or if the bleeding is heavy and unmanageable. ° °Reduce your risk factors of vascular disease: ° °Stop smoking. If you would like help call [facility] at 1-800-QUIT-NOW (1-800-784-8669) or [place] at [phone]. °Manage your cholesterol °Maintain a desired weight °Control your diabetes °Keep your blood pressure down ° °If you have any questions, please call the office at [phone] ° °

## 2023-01-11 NOTE — Progress Notes (Signed)
[facility] [place]  Subjective  - He want to go home.  He will discuss his options with him family.   Objective (!) 144/53 74 98.1 F (36.7 C) (Oral) 16 94%  Intake/Output Summary (Last 24 hours) at 01/11/2023 0739 Last data filed at 01/11/2023 0524 Gross per 24 hour  Intake 200 ml  Output 650 ml  Net -450 ml   No change in open wounds Lungs non labored breathing   Assessment/Planning: PAD B LE with tissue loss s/p diagnostic angiogram without endovascular options.  Findings: The aorta and iliac segments are heavily calcified although free of flow-limiting stenosis.  On the right side there is at least 50% stenosis of the right common femoral artery and the SFA is occluded he could not tolerate CO2 to evaluate for any reconstitution but was heavily calcified throughout.  On the left side the SFA is calcified but patent until the adductor canal where it is flush occluded and reconstitutes below the knee popliteal island and there is a very distal thread of the peroneal artery reconstituted as well.  Attempted to cross the heavily calcified SFA but was unable to get any catheter to pass and ultimately given his significant disease and long segment occlusions with wound I elected to terminate the procedure.   Patient will need left above-knee amputation for wound possibly right above-knee amputation for rest pain.  F/U with Dr. Stanford Breed arranged to discuss options and make plans for intervention.  Roxy Horseman 01/11/2023 7:39 AM --  Laboratory Lab Results: Recent Labs    01/10/23 1113  HGB 9.9*  HCT 29.0*   BMET Recent Labs    01/10/23 1113  NA 137  K 3.8  CL 103  GLUCOSE 98  BUN 20  CREATININE 1.90*    COAG Lab Results  Component Value Date   INR 1.4 (H) 07/18/2022   INR 1.06 12/17/2018   INR 1.30 07/24/2018   No results found for: "PTT"

## 2023-01-12 NOTE — Discharge Summary (Signed)
Physician Discharge Summary  Patient ID: Steve [name]. MRN: [id number] DOB/AGE: Mar 09, 1941 82 y.o.  Admit date: 01/10/2023 Discharge date: 01/11/2023  Admission Diagnosis: BLE CLTI  Discharge Diagnoses:  same  Secondary Diagnoses: Principal Problem:   PAD (peripheral artery disease) ([place])   Procedures: 1.  Ultrasound-guided cannulation right common femoral artery 2.  CO2 aortogram and right lower extremity angiogram and left lower extremity angiogram with contrast used below the knee and the left lower extremity 3.  Attempted crossing left SFA occlusion 4.  Moderate sedation with fentanyl and Versed for 55 minutes 5.  Mynx device closure right common femoral artery  Discharged Condition: stable  Hospital Course: Patient was admitted after attempted endovascular revascularization left lower extremity as he had no one to stay with him at his assisted living facility.  He was discharged the following day prior to MD evaluation given the timing of his ride availability.  Patient likely to require bilateral above-knee amputations with left being the most pressing.   Significant Diagnostic Studies: CBC    Latest Ref Rng & Units 01/10/2023   11:13 AM 08/10/2022   12:22 PM 07/24/2022    4:38 AM  CBC  WBC 3.8 - 10.8 Thousand/uL  5.3  5.0   Hemoglobin 13.0 - 17.0 g/dL 9.9  10.6  10.2   Hematocrit 39.0 - 52.0 % 29.0  31.9  30.3   Platelets 140 - 400 Thousand/uL  83  153      BMET @LASTCHEMISTRY @  COAG Lab Results  Component Value Date   INR 1.4 (H) 07/18/2022   INR 1.06 12/17/2018   INR 1.30 07/24/2018   No results found for: "PTT"  Disposition: Discharge disposition: 01-Home or Self Care       Discharge Instructions     Call MD for:  redness, tenderness, or signs of infection (pain, swelling, bleeding, redness, odor or green/yellow discharge around incision site)   Complete by: As directed    Call MD for:  severe or increased pain, loss or decreased  feeling  in affected limb(s)   Complete by: As directed    Call MD for:  temperature >100.5   Complete by: As directed    Resume previous diet   Complete by: As directed       Allergies as of 01/11/2023       Reactions   Januvia [sitagliptin] Other (See Comments)   Soreness to stomach area. Intolerance    Statins Other (See Comments)   Myalgias, muscle weakness, swelling, pain- prescribed Lipitor in 06/2022 (??)        Medication List     TAKE these medications    acetaminophen 500 MG tablet Commonly known as: TYLENOL Take 500 mg by mouth every 6 (six) hours as needed for moderate pain.   apixaban 2.5 MG Tabs tablet Commonly known as: ELIQUIS Take 2.5 mg by mouth 2 (two) times daily.   blood glucose meter kit and supplies Dispense based on patient and insurance preference. Use up to four times daily as directed. (FOR ICD-10 E10.9, E11.9).   cyanocobalamin 1000 MCG tablet Commonly known as: VITAMIN B12 Take 1,000 mcg by mouth daily.   doxazosin 2 MG tablet Commonly known as: CARDURA Take 2 mg by mouth daily.   ezetimibe 10 MG tablet Commonly known as: ZETIA Take 1 tablet (10 mg total) by mouth daily.   HYDROcodone-acetaminophen 5-325 MG tablet Commonly known as: NORCO/VICODIN Take 1 tablet by mouth every 8 (eight) hours as needed for  moderate pain.   hydrOXYzine 10 MG tablet Commonly known as: ATARAX Take 10 mg by mouth 2 (two) times daily as needed for itching.   INSULIN SYRINGE .3CC/31GX5/16" 31G X 5/16" 0.3 ML Misc Use with insulin   isosorbide mononitrate 30 MG 24 hr tablet Commonly known as: IMDUR Take 1 tablet (30 mg total) by mouth daily.   Levemir 100 UNIT/ML injection Generic drug: insulin detemir Inject 10 Units into the skin every morning. Hold if BS is LESS than 70 or GREATER than 300   metoprolol succinate 100 MG 24 hr tablet Commonly known as: TOPROL-XL Take 1 tablet (100 mg total) by mouth daily. Take with or immediately following a  meal.   pantoprazole 40 MG tablet Commonly known as: PROTONIX Take 1 tablet (40 mg total) by mouth 2 (two) times daily.   pravastatin 40 MG tablet Commonly known as: PRAVACHOL Take 40 mg by mouth at bedtime.   tamsulosin 0.4 MG Caps capsule Commonly known as: FLOMAX Take 0.4 mg by mouth daily.   traMADol 50 MG tablet Commonly known as: ULTRAM Take 50 mg by mouth every 8 (eight) hours as needed for moderate pain.   Vitamin D3 50 MCG (2000 UT) Caps Generic drug: Cholecalciferol Take 2,000 Units by mouth daily.         Signed: Eda Paschal. Donzetta Matters, MD [facility] [place] Office: [phone] Pager: [phone]   01/12/2023, 8:18 AM

## 2023-01-19 ENCOUNTER — Telehealth: Payer: Self-pay | Admitting: *Deleted

## 2023-01-19 ENCOUNTER — Emergency Department (HOSPITAL_COMMUNITY): Payer: Medicare Other

## 2023-01-19 ENCOUNTER — Observation Stay (HOSPITAL_COMMUNITY)
Admission: EM | Admit: 2023-01-19 | Discharge: 2023-01-20 | Disposition: A | Discharge status: Home / Self Care | Payer: Medicare Other | Attending: Internal Medicine | Admitting: Internal Medicine

## 2023-01-19 ENCOUNTER — Telehealth: Payer: Self-pay | Admitting: Vascular Surgery

## 2023-01-19 ENCOUNTER — Other Ambulatory Visit: Payer: Self-pay

## 2023-01-19 ENCOUNTER — Encounter (HOSPITAL_COMMUNITY): Payer: Self-pay

## 2023-01-19 DIAGNOSIS — Z87891 Personal history of nicotine dependence: Secondary | ICD-10-CM | POA: Insufficient documentation

## 2023-01-19 DIAGNOSIS — I480 Paroxysmal atrial fibrillation: Secondary | ICD-10-CM | POA: Diagnosis present

## 2023-01-19 DIAGNOSIS — I130 Hypertensive heart and chronic kidney disease with heart failure and stage 1 through stage 4 chronic kidney disease, or unspecified chronic kidney disease: Secondary | ICD-10-CM | POA: Diagnosis not present

## 2023-01-19 DIAGNOSIS — Z95820 Peripheral vascular angioplasty status with implants and grafts: Secondary | ICD-10-CM | POA: Insufficient documentation

## 2023-01-19 DIAGNOSIS — Z89422 Acquired absence of other left toe(s): Secondary | ICD-10-CM | POA: Diagnosis not present

## 2023-01-19 DIAGNOSIS — L089 Local infection of the skin and subcutaneous tissue, unspecified: Secondary | ICD-10-CM | POA: Diagnosis not present

## 2023-01-19 DIAGNOSIS — R52 Pain, unspecified: Secondary | ICD-10-CM | POA: Diagnosis present

## 2023-01-19 DIAGNOSIS — I70223 Atherosclerosis of native arteries of extremities with rest pain, bilateral legs: Secondary | ICD-10-CM

## 2023-01-19 DIAGNOSIS — E1122 Type 2 diabetes mellitus with diabetic chronic kidney disease: Secondary | ICD-10-CM | POA: Insufficient documentation

## 2023-01-19 DIAGNOSIS — Z79899 Other long term (current) drug therapy: Secondary | ICD-10-CM | POA: Insufficient documentation

## 2023-01-19 DIAGNOSIS — I5042 Chronic combined systolic (congestive) and diastolic (congestive) heart failure: Secondary | ICD-10-CM | POA: Diagnosis present

## 2023-01-19 DIAGNOSIS — M79672 Pain in left foot: Secondary | ICD-10-CM | POA: Diagnosis not present

## 2023-01-19 DIAGNOSIS — I2510 Atherosclerotic heart disease of native coronary artery without angina pectoris: Secondary | ICD-10-CM | POA: Diagnosis not present

## 2023-01-19 DIAGNOSIS — E119 Type 2 diabetes mellitus without complications: Secondary | ICD-10-CM

## 2023-01-19 DIAGNOSIS — I25118 Atherosclerotic heart disease of native coronary artery with other forms of angina pectoris: Secondary | ICD-10-CM

## 2023-01-19 DIAGNOSIS — I998 Other disorder of circulatory system: Secondary | ICD-10-CM | POA: Diagnosis not present

## 2023-01-19 DIAGNOSIS — Z951 Presence of aortocoronary bypass graft: Secondary | ICD-10-CM | POA: Diagnosis not present

## 2023-01-19 DIAGNOSIS — N184 Chronic kidney disease, stage 4 (severe): Secondary | ICD-10-CM | POA: Diagnosis present

## 2023-01-19 DIAGNOSIS — I872 Venous insufficiency (chronic) (peripheral): Secondary | ICD-10-CM | POA: Diagnosis not present

## 2023-01-19 DIAGNOSIS — E785 Hyperlipidemia, unspecified: Secondary | ICD-10-CM | POA: Diagnosis present

## 2023-01-19 DIAGNOSIS — E782 Mixed hyperlipidemia: Secondary | ICD-10-CM

## 2023-01-19 DIAGNOSIS — D649 Anemia, unspecified: Secondary | ICD-10-CM | POA: Diagnosis not present

## 2023-01-19 DIAGNOSIS — Z794 Long term (current) use of insulin: Secondary | ICD-10-CM | POA: Insufficient documentation

## 2023-01-19 DIAGNOSIS — R9431 Abnormal electrocardiogram [ECG] [EKG]: Secondary | ICD-10-CM | POA: Diagnosis present

## 2023-01-19 DIAGNOSIS — I739 Peripheral vascular disease, unspecified: Secondary | ICD-10-CM | POA: Diagnosis present

## 2023-01-19 DIAGNOSIS — I10 Essential (primary) hypertension: Secondary | ICD-10-CM | POA: Diagnosis present

## 2023-01-19 DIAGNOSIS — Z7189 Other specified counseling: Secondary | ICD-10-CM

## 2023-01-19 DIAGNOSIS — Z515 Encounter for palliative care: Secondary | ICD-10-CM

## 2023-01-19 LAB — CBC
HCT: 24.7 % — ABNORMAL LOW (ref 39.0–52.0)
Hemoglobin: 8.1 g/dL — ABNORMAL LOW (ref 13.0–17.0)
MCH: 31.4 pg (ref 26.0–34.0)
MCHC: 32.8 g/dL (ref 30.0–36.0)
MCV: 95.7 fL (ref 80.0–100.0)
Platelets: 165 10*3/uL (ref 150–400)
RBC: 2.58 MIL/uL — ABNORMAL LOW (ref 4.22–5.81)
RDW: 13.1 % (ref 11.5–15.5)
WBC: 8.6 10*3/uL (ref 4.0–10.5)
nRBC: 0 % (ref 0.0–0.2)

## 2023-01-19 LAB — COMPREHENSIVE METABOLIC PANEL
ALT: 9 U/L (ref 0–44)
AST: 14 U/L — ABNORMAL LOW (ref 15–41)
Albumin: 2.9 g/dL — ABNORMAL LOW (ref 3.5–5.0)
Alkaline Phosphatase: 63 U/L (ref 38–126)
Anion gap: 13 (ref 5–15)
BUN: 23 mg/dL (ref 8–23)
CO2: 18 mmol/L — ABNORMAL LOW (ref 22–32)
Calcium: 8.5 mg/dL — ABNORMAL LOW (ref 8.9–10.3)
Chloride: 101 mmol/L (ref 98–111)
Creatinine, Ser: 2.09 mg/dL — ABNORMAL HIGH (ref 0.61–1.24)
GFR, Estimated: 31 mL/min — ABNORMAL LOW (ref >60)
Glucose, Bld: 87 mg/dL (ref 70–99)
Potassium: 3.5 mmol/L (ref 3.5–5.1)
Sodium: 132 mmol/L — ABNORMAL LOW (ref 135–145)
Total Bilirubin: 0.8 mg/dL (ref 0.3–1.2)
Total Protein: 6.8 g/dL (ref 6.5–8.1)

## 2023-01-19 LAB — LACTIC ACID, PLASMA: Lactic Acid, Venous: 1.8 mmol/L (ref 0.5–1.9)

## 2023-01-19 LAB — ACETAMINOPHEN LEVEL: Acetaminophen (Tylenol), Serum: 12 ug/mL (ref 10–30)

## 2023-01-19 LAB — GLUCOSE, CAPILLARY: Glucose-Capillary: 111 mg/dL — ABNORMAL HIGH (ref 70–99)

## 2023-01-19 LAB — CBG MONITORING, ED: Glucose-Capillary: 102 mg/dL — ABNORMAL HIGH (ref 70–99)

## 2023-01-19 MED ORDER — ACETAMINOPHEN 325 MG PO TABS: 650.0000 mg | ORAL_TABLET | Freq: Four times a day (QID) | ORAL | Status: DC | PRN

## 2023-01-19 MED ORDER — BIOTENE DRY MOUTH MT LIQD: 15.0000 mL | OROMUCOSAL | Status: DC | PRN

## 2023-01-19 MED ORDER — DIPHENHYDRAMINE HCL 50 MG/ML IJ SOLN: 12.5000 mg | INTRAMUSCULAR | Status: DC | PRN

## 2023-01-19 MED ORDER — INFLUENZA VAC A&B SA ADJ QUAD 0.5 ML IM PRSY: 0.5000 mL | PREFILLED_SYRINGE | INTRAMUSCULAR | Status: DC | PRN

## 2023-01-19 MED ORDER — ACETAMINOPHEN 650 MG RE SUPP: 650.0000 mg | Freq: Four times a day (QID) | RECTAL | Status: DC | PRN

## 2023-01-19 MED ORDER — ONDANSETRON 4 MG PO TBDP: 4.0000 mg | ORAL_TABLET | Freq: Four times a day (QID) | ORAL | Status: DC | PRN

## 2023-01-19 MED ORDER — POLYVINYL ALCOHOL 1.4 % OP SOLN: 1.0000 [drp] | Freq: Four times a day (QID) | OPHTHALMIC | Status: DC | PRN

## 2023-01-19 MED ORDER — SODIUM CHLORIDE 0.9% FLUSH
3.0000 mL | Freq: Two times a day (BID) | INTRAVENOUS | Status: DC
  Administered 2023-01-19: 3 mL via INTRAVENOUS

## 2023-01-19 MED ORDER — LORAZEPAM 2 MG/ML PO CONC: 1.0000 mg | ORAL | Status: DC | PRN

## 2023-01-19 MED ORDER — MORPHINE SULFATE (PF) 2 MG/ML IV SOLN
2.0000 mg | Freq: Once | INTRAVENOUS | Status: AC
  Administered 2023-01-19: 2 mg via INTRAVENOUS
  Filled 2023-01-19: qty 1

## 2023-01-19 MED ORDER — OXYCODONE HCL 5 MG PO TABS
10.0000 mg | ORAL_TABLET | ORAL | Status: DC | PRN
  Administered 2023-01-20: 10 mg via ORAL
  Filled 2023-01-19: qty 2

## 2023-01-19 MED ORDER — METOPROLOL SUCCINATE ER 100 MG PO TB24
100.0000 mg | ORAL_TABLET | Freq: Every day | ORAL | Status: DC
  Administered 2023-01-20: 100 mg via ORAL
  Filled 2023-01-19: qty 1

## 2023-01-19 MED ORDER — INSULIN ASPART 100 UNIT/ML IJ SOLN: 0.0000 [IU] | Freq: Three times a day (TID) | INTRAMUSCULAR | Status: DC

## 2023-01-19 MED ORDER — EZETIMIBE 10 MG PO TABS
10.0000 mg | ORAL_TABLET | Freq: Every day | ORAL | Status: DC
  Administered 2023-01-20: 10 mg via ORAL
  Filled 2023-01-19: qty 1

## 2023-01-19 MED ORDER — PIPERACILLIN-TAZOBACTAM 3.375 G IVPB 30 MIN
3.3750 g | Freq: Once | INTRAVENOUS | Status: DC
  Filled 2023-01-19: qty 50

## 2023-01-19 MED ORDER — MORPHINE SULFATE (PF) 4 MG/ML IV SOLN
4.0000 mg | Freq: Once | INTRAVENOUS | Status: AC
  Administered 2023-01-19: 4 mg via INTRAVENOUS
  Filled 2023-01-19: qty 1

## 2023-01-19 MED ORDER — APIXABAN 2.5 MG PO TABS
2.5000 mg | ORAL_TABLET | Freq: Two times a day (BID) | ORAL | Status: DC
  Administered 2023-01-19 – 2023-01-20 (×2): 2.5 mg via ORAL
  Filled 2023-01-19 (×2): qty 1

## 2023-01-19 MED ORDER — PRAVASTATIN SODIUM 40 MG PO TABS: 40.0000 mg | ORAL_TABLET | Freq: Every day | ORAL | Status: DC

## 2023-01-19 MED ORDER — PIPERACILLIN-TAZOBACTAM 3.375 G IVPB 30 MIN
3.3750 g | Freq: Once | INTRAVENOUS | Status: AC
  Administered 2023-01-19: 3.375 g via INTRAVENOUS

## 2023-01-19 MED ORDER — ONDANSETRON HCL 4 MG/2ML IJ SOLN: 4.0000 mg | Freq: Four times a day (QID) | INTRAMUSCULAR | Status: DC | PRN

## 2023-01-19 MED ORDER — ISOSORBIDE MONONITRATE ER 30 MG PO TB24
30.0000 mg | ORAL_TABLET | Freq: Every day | ORAL | Status: DC
  Administered 2023-01-20: 30 mg via ORAL
  Filled 2023-01-19: qty 1

## 2023-01-19 MED ORDER — LORAZEPAM 1 MG PO TABS
1.0000 mg | ORAL_TABLET | ORAL | Status: DC | PRN
  Administered 2023-01-20: 1 mg via ORAL
  Filled 2023-01-19: qty 1

## 2023-01-19 MED ORDER — MORPHINE SULFATE (PF) 2 MG/ML IV SOLN
2.0000 mg | INTRAVENOUS | Status: DC | PRN
  Administered 2023-01-19 – 2023-01-20 (×2): 2 mg via INTRAVENOUS
  Filled 2023-01-19 (×2): qty 1

## 2023-01-19 NOTE — Telephone Encounter (Signed)
Patient called and left a message on Triage line stating he is in extreme pain and needs to be seen immediately. Tried to call patient back and got no answer. Called patients emergency contact number and spoke to patients son he states they called EMS to take patient to [facility] ER. Son states patient needs to be admitted to hospital for pain control. Sent message to Dr Nicole Cella pager since he is on call to let him know patient was in route. Also paged Corrie PA as well. We then received a call from paramedic stating patient is threatening suicide if not seen in our office . It was communicated to EMS patient needs to be seen in ER and our on call provider has been notified. We can't treat patient in office. Ems states they will transport patient to ER.

## 2023-01-19 NOTE — ED Provider Notes (Addendum)
[facility] Provider Note   CSN: [id number] Arrival date & time: 01/19/23  1134     History  No chief complaint on file.   Steve [name]. is a 82 y.o. male.  HPI  82 year old man history of peripheral vascular disease with recent admission for attempted revascularization presents today complaining of left foot pain.  He states pain has gotten worse over the past several days.  He has open wounds to the left foot and history of diabetes.  Patient ported today is to be scheduled for amputation.  He reports that he did tell his son that he might not want to live.  He does not report current suicidal ideation or any attempts.    Home Medications Prior to Admission medications   Medication Sig Start Date End Date Taking? Authorizing Provider  acetaminophen (TYLENOL) 500 MG tablet Take 500 mg by mouth every 6 (six) hours as needed for moderate pain.    [provider]  apixaban (ELIQUIS) 2.5 MG TABS tablet Take 2.5 mg by mouth 2 (two) times daily. 11/23/21   [provider]  blood glucose meter kit and supplies Dispense based on patient and insurance preference. Use up to four times daily as directed. (FOR ICD-10 E10.9, E11.9). 04/18/19   Cristal Ford, DO  Cholecalciferol (VITAMIN D3) 50 MCG (2000 UT) capsule Take 2,000 Units by mouth daily. 11/23/21   [provider]  cyanocobalamin (VITAMIN B12) 1000 MCG tablet Take 1,000 mcg by mouth daily.    [provider]  doxazosin (CARDURA) 2 MG tablet Take 2 mg by mouth daily. 11/30/21   [provider]  ezetimibe (ZETIA) 10 MG tablet Take 1 tablet (10 mg total) by mouth daily. 07/24/22   Thurnell Lose, MD  HYDROcodone-acetaminophen (NORCO/VICODIN) 5-325 MG tablet Take 1 tablet by mouth every 8 (eight) hours as needed for moderate pain. 12/27/22   [provider]  hydrOXYzine (ATARAX) 10 MG tablet Take 10 mg by mouth 2 (two) times daily as needed  for itching. 11/08/22   [provider]  insulin detemir (LEVEMIR) 100 UNIT/ML injection Inject 10 Units into the skin every morning. Hold if BS is LESS than 70 or GREATER than 300    [provider]  Insulin Syringe-Needle U-100 (INSULIN SYRINGE .3CC/31GX5/16") 31G X 5/16" 0.3 ML MISC Use with insulin 04/18/19   Mikhail, Velta Addison, DO  isosorbide mononitrate (IMDUR) 30 MG 24 hr tablet Take 1 tablet (30 mg total) by mouth daily. 07/13/22   Kathie Dike, MD  metoprolol succinate (TOPROL-XL) 100 MG 24 hr tablet Take 1 tablet (100 mg total) by mouth daily. Take with or immediately following a meal. 12/19/18   Reino Bellis B, NP  pantoprazole (PROTONIX) 40 MG tablet Take 1 tablet (40 mg total) by mouth 2 (two) times daily. 07/24/22   Thurnell Lose, MD  pravastatin (PRAVACHOL) 40 MG tablet Take 40 mg by mouth at bedtime.    [provider]  tamsulosin (FLOMAX) 0.4 MG CAPS capsule Take 0.4 mg by mouth daily.    [provider]  traMADol (ULTRAM) 50 MG tablet Take 50 mg by mouth every 8 (eight) hours as needed for moderate pain. 11/01/22   [provider]  amLODipine (NORVASC) 10 MG tablet TAKE 1 TABLET DAILY Patient not taking: Reported on 05/20/2020 11/09/19 05/20/20  Lendon Colonel, NP      Allergies    Januvia [sitagliptin] and Statins    Review of  Systems   Review of Systems  Physical Exam Updated Vital Signs BP (!) 142/57   Pulse 61   Temp 97.8 F (36.6 C) (Oral)   Resp 20   SpO2 100%  Physical Exam Vitals and nursing note reviewed.  Constitutional:      Appearance: Normal appearance.  HENT:     Head: Normocephalic.     Right Ear: External ear normal.     Left Ear: External ear normal.     Nose: Nose normal.     Mouth/Throat:     Pharynx: Oropharynx is clear.  Eyes:     Conjunctiva/sclera: Conjunctivae normal.  Cardiovascular:     Rate and Rhythm: Normal rate and regular rhythm.  Pulmonary:     Effort: Pulmonary effort is  normal.  Abdominal:     Palpations: Abdomen is soft.  Musculoskeletal:     Cervical back: Normal range of motion.     Comments: Left foot with erythema spreading proximally to lower leg   Wounds of left foot noted Unable to palpate DP pulses Previous fifth toe amputation noted  Neurological:     Mental Status: He is alert.     ED Results / Procedures / Treatments   Labs (all labs ordered are listed, but only abnormal results are displayed) Labs Reviewed  CBC - Abnormal; Notable for the following components:      Result Value   RBC 2.58 (*)    Hemoglobin 8.1 (*)    HCT 24.7 (*)    All other components within normal limits  COMPREHENSIVE METABOLIC PANEL - Abnormal; Notable for the following components:   Sodium 132 (*)    CO2 18 (*)    Creatinine, Ser 2.09 (*)    Calcium 8.5 (*)    Albumin 2.9 (*)    AST 14 (*)    GFR, Estimated 31 (*)    All other components within normal limits  CULTURE, BLOOD (ROUTINE X 2)  CULTURE, BLOOD (ROUTINE X 2)  LACTIC ACID, PLASMA  ACETAMINOPHEN LEVEL    EKG None  Radiology DG Foot 2 Views Left  Result Date: 01/19/2023 CLINICAL DATA:  wounds known poor circulation EXAM: LEFT FOOT - 2 VIEW COMPARISON:  07/09/2022 FINDINGS: Remote amputation of the fifth Steve [name] at the distal metatarsal diaphyseal level. Nonspecific heterogeneous density along the distal flap margin is probably incidental and less likely to represent gas in the soft tissues. Substantial vascular calcification noted. Faint density along the medial margin of the great toe and fifth metatarsal, probably from bandaging. No bony destructive findings characteristic of active osteomyelitis identified. IMPRESSION: 1. Remote amputation of the fifth Steve [name] at the distal metatarsal diaphyseal level. No bony destructive findings characteristic of active osteomyelitis. 2. Substantial vascular calcification. 3. Probable bandaging along the medial distal foot and great toe. Electronically Signed   By:  Van Clines M.D.   On: 01/19/2023 12:17    Procedures .Critical Care  Performed by: Pattricia Boss, MD Authorized by: Pattricia Boss, MD   Critical care provider statement:    Critical care time (minutes):  80   Critical care was time spent personally by me on the following activities:  Development of treatment plan with patient or surrogate, discussions with consultants, evaluation of patient's response to treatment, examination of patient, ordering and review of laboratory studies, ordering and review of radiographic studies, ordering and performing treatments and interventions, pulse oximetry, re-evaluation of patient's condition and review of old charts     Medications Ordered in ED Medications  morphine (PF) 2 MG/ML injection 2 mg (has no administration in time range)  morphine (PF) 4 MG/ML injection 4 mg (has no administration in time range)  oxyCODONE (Oxy IR/ROXICODONE) immediate release tablet 10 mg (has no administration in time range)  acetaminophen (TYLENOL) tablet 650 mg (has no administration in time range)    Or  acetaminophen (TYLENOL) suppository 650 mg (has no administration in time range)  ondansetron (ZOFRAN-ODT) disintegrating tablet 4 mg (has no administration in time range)    Or  ondansetron (ZOFRAN) injection 4 mg (has no administration in time range)  antiseptic oral rinse (BIOTENE) solution 15 mL (has no administration in time range)  polyvinyl alcohol (LIQUIFILM TEARS) 1.4 % ophthalmic solution 1 drop (has no administration in time range)  LORazepam (ATIVAN) tablet 1 mg (has no administration in time range)    Or  LORazepam (ATIVAN) 2 MG/ML concentrated solution 1 mg (has no administration in time range)  diphenhydrAMINE (BENADRYL) injection 12.5 mg (has no administration in time range)  morphine (PF) 2 MG/ML injection 2 mg (2 mg Intravenous Given 01/19/23 1425)  piperacillin-tazobactam (ZOSYN) IVPB 3.375 g (0 g Intravenous Stopped 01/19/23 1559)    ED  Course/ Medical Decision Making/ A&P Clinical Course as of 01/19/23 1609  Wed Jan 19, 2023  1535 Comprehensive metabolic panel reviewed interpreted significant for hyponatremia with sodium 132, CO2 decreased at 18, creatinine increased to 2.09 Creatinine has been increased above 2 in the past but most recently was down to 1.5 Lactic acid reviewed interpreted within normal limits [DR]    Clinical Course User Index [DR] Pattricia Boss, MD                             Medical Decision Making Amount and/or Complexity of Data Reviewed Labs: ordered. Radiology: ordered.  Risk Prescription drug management. Decision regarding hospitalization.   1-left foot pain with worsening infection although no evidence of osteomyelitis noted 2 peripheral vascular disease which has failed intervention with likely next steps of amputation.  However, it is not clear if patient wishes to proceed with amputation 3 patient reports having some depression and thoughts of harming himself 4 plan consult to psych as inpatient secondary to #3-consult pending 5 Discussed with vascular surgery and they are aware the patient will be admitted to medicine 6 will consult palliative care- consult note pending  With Dr. Trilby Drummer who will see for admission Long conversation with son, Jiovany Schnick Plan admission for pain management and psychiatric consultation Discussed with Dr. Dwyane Dee and Dr. Trilby Drummer aware that inpatient psych consult will need to be placed.         Final Clinical Impression(s) / ED Diagnoses Final diagnoses:  Vascular insufficiency of extremity  Left foot infection  Pain    Rx / DC Orders ED Discharge Orders     None         Pattricia Boss, MD 01/19/23 1554    Pattricia Boss, MD 01/19/23 1609    Pattricia Boss, MD 01/19/23 1609

## 2023-01-19 NOTE — Progress Notes (Addendum)
Addend: 4:09 pm Patient can return to ALF once DC with Home Hospice.    Patient is set up with Home Hospice, however the provider in the ED will have the patient admitted at this time for pain control. The Patient stays at [facility] at this time. The contact person for this patient is the son. Please continue to update son with the patient's status.

## 2023-01-19 NOTE — Consult Note (Signed)
ASSESSMENT & PLAN   CRITICAL LIMB ISCHEMIA BILATERALLY: This patient has severe rest pain of the right foot and nonhealing wounds on the left.  He has undergone arteriography and has not unreconstructable vascular disease.  He has no further options for revascularization. He will need bilateral above-the-knee amputations.  The earliest we could schedule this is next week.  He states that he feels comfortable going home and we can arrange to bring him in next week for surgery.  REASON FOR CONSULT:    Severe pain in both legs.  HPI:   Steve Mcvoy. is a 82 y.o. male who underwent a left common femoral artery to peroneal artery bypass with a 6 mm PTFE by Dr. Stanford Breed on 07/07/2022.  Most recently, on 01/10/2023 he underwent arteriography of both lower extremities.  He had not unreconstructable disease bilaterally.  It was felt that he would require bilateral above-the-knee amputations.  He was scheduled to see Dr. Stanford Breed on 02/01/2023.  He was sent to the emergency department today because of severe pain in both feet.  He states that his pain is now better.  He has wounds on the left foot which have not improved.  On the right side he has rest pain.  The pain feels much better when he is sitting up with his foot dependent.  He has multiple medical issues including type 2 diabetes, hypertension, hyperlipidemia, coronary artery disease, and ischemic cardiomyopathy.  Past Medical History:  Diagnosis Date   CAD (coronary artery disease)    a. s/p CABG x 29 Nov 2011 with LIMA to LAD, SVG to OM1 and distal LCX, SVG to ramus intermediate   Essential hypertension    Hyperlipidemia    Intermittent claudication ([place])    Ischemic cardiomyopathy    a. 03/2015 Echo: EF45-50%, Gr1 DD, mild MR.   PAF (paroxysmal atrial fibrillation) ([facility])    a. post op atrial fib 11/2011; short course of amiodarone; stopped 01/25/12   Peripheral arterial disease ([place])    Right bundle branch block    Type II diabetes mellitus  ([place])     Family History  Problem Relation Age of Onset   Stroke Mother 90       Cerebellar hemorrhage    SOCIAL HISTORY: Social History   Tobacco Use   Smoking status: Former    Packs/day: 1.50    Years: 20.00    Additional pack years: 0.00    Total pack years: 30.00    Types: Cigarettes    Quit date: 05/27/1979    Years since quitting: 43.6   Smokeless tobacco: Former  Substance Use Topics   Alcohol use: Yes    Alcohol/week: 4.0 standard drinks of alcohol    Types: 4 Shots of liquor per week    Comment: 4 "canadian clubs each night"    Allergies  Allergen Reactions   Januvia [Sitagliptin] Other (See Comments)    Soreness to stomach area. Intolerance    Statins Other (See Comments)    Myalgias, muscle weakness, swelling, pain- prescribed Lipitor in 06/2022 (??)    No current facility-administered medications for this encounter.   Current Outpatient Medications  Medication Sig Dispense Refill   acetaminophen (TYLENOL) 500 MG tablet Take 500 mg by mouth every 6 (six) hours as needed for moderate pain.     apixaban (ELIQUIS) 2.5 MG TABS tablet Take 2.5 mg by mouth 2 (two) times daily.     blood glucose meter kit and supplies Dispense based on patient and  insurance preference. Use up to four times daily as directed. (FOR ICD-10 E10.9, E11.9). 1 each 1   Cholecalciferol (VITAMIN D3) 50 MCG (2000 UT) capsule Take 2,000 Units by mouth daily.     cyanocobalamin (VITAMIN B12) 1000 MCG tablet Take 1,000 mcg by mouth daily.     doxazosin (CARDURA) 2 MG tablet Take 2 mg by mouth daily.     ezetimibe (ZETIA) 10 MG tablet Take 1 tablet (10 mg total) by mouth daily. 90 tablet 3   HYDROcodone-acetaminophen (NORCO/VICODIN) 5-325 MG tablet Take 1 tablet by mouth every 8 (eight) hours as needed for moderate pain.     hydrOXYzine (ATARAX) 10 MG tablet Take 10 mg by mouth 2 (two) times daily as needed for itching.     insulin detemir (LEVEMIR) 100 UNIT/ML injection Inject 10 Units into  the skin every morning. Hold if BS is LESS than 70 or GREATER than 300     Insulin Syringe-Needle U-100 (INSULIN SYRINGE .3CC/31GX5/16") 31G X 5/16" 0.3 ML MISC Use with insulin 100 each 2   isosorbide mononitrate (IMDUR) 30 MG 24 hr tablet Take 1 tablet (30 mg total) by mouth daily. 30 tablet 0   metoprolol succinate (TOPROL-XL) 100 MG 24 hr tablet Take 1 tablet (100 mg total) by mouth daily. Take with or immediately following a meal. 30 tablet 3   pantoprazole (PROTONIX) 40 MG tablet Take 1 tablet (40 mg total) by mouth 2 (two) times daily.     pravastatin (PRAVACHOL) 40 MG tablet Take 40 mg by mouth at bedtime.     tamsulosin (FLOMAX) 0.4 MG CAPS capsule Take 0.4 mg by mouth daily.     traMADol (ULTRAM) 50 MG tablet Take 50 mg by mouth every 8 (eight) hours as needed for moderate pain.      REVIEW OF SYSTEMS:  [X]  denotes positive finding, [ ]  denotes negative finding Cardiac  Comments:  Chest pain or chest pressure:    Shortness of breath upon exertion: x   Short of breath when lying flat:    Irregular heart rhythm:        Vascular    Pain in calf, thigh, or hip brought on by ambulation: x   Pain in feet at night that wakes you up from your sleep:  x   Blood clot in your veins:    Leg swelling:         Pulmonary    Oxygen at home:    Productive cough:     Wheezing:         Neurologic    Sudden weakness in arms or legs:     Sudden numbness in arms or legs:     Sudden onset of difficulty speaking or slurred speech:    Temporary loss of vision in one eye:     Problems with dizziness:         Gastrointestinal    Blood in stool:     Vomited blood:         Genitourinary    Burning when urinating:     Blood in urine:        Psychiatric    Major depression:         Hematologic    Bleeding problems:    Problems with blood clotting too easily:        Skin    Rashes or ulcers: x       Constitutional    Fever or chills:    -  PHYSICAL  EXAM:   Vitals:   01/19/23  1136 01/19/23 1139 01/19/23 1141  BP: (!) 150/59    Pulse: 70 67   Resp: (!) 26 15   Temp:   98.1 F (36.7 C)  TempSrc:   Oral  SpO2: 100% 99%    There is no height or weight on file to calculate BMI. GENERAL: The patient is a well-nourished male, in no acute distress. The vital signs are documented above. CARDIAC: There is a regular rate and rhythm.  VASCULAR: I do not detect carotid bruits. On the right side he has a palpable femoral pulse.  He has a barely audible posterior tibial signal with the Doppler and a dampened monophasic dorsalis pedis signal. On the left side he has a palpable femoral pulse.  He has a barely audible posterior tibial signal and dorsalis pedis signal. PULMONARY: There is good air exchange bilaterally without wheezing or rales. ABDOMEN: Soft and non-tender with normal pitched bowel sounds.  MUSCULOSKELETAL: There are no major deformities. NEUROLOGIC: No focal weakness or paresthesias are detected. SKIN: He has extensive wounds on the left foot.  He has some superficial wounds on the right.  PSYCHIATRIC: The patient has a normal affect.  DATA:    LABS: His white blood cell count is 8.6.  Hemoglobin is 8.1.  Platelets are 165,000.  [name] [facility]

## 2023-01-19 NOTE — Progress Notes (Signed)
[facility] ED 11 [facility])  Hospital Liaison RN note  Received request from [facility] for hospice services at [facility] after discharge. Chart and patient information under review by Hospice physician.   Left message for son and will plan to meet patient in room to initiate education related to hospice philosophy, services, and team approach to care.   Per discussion, the plan is for discharge home is unclear at this time.  Will follow up on DME needs tomorrow.   Please send signed and completed DNR with patient/family. Please provide prescriptions at discharge as needed for ongoing symptom management.   Please call with any hospice related questions or concerns.  Thank you for the opportunity to participate in this patient's care.  Jhonnie Garner, Therapist, sports, BSN [employer] [phone]

## 2023-01-19 NOTE — Telephone Encounter (Signed)
EMS called in to see if we can see pt in office before scheduled 4/9 appt with Dr. Stanford Breed. Stated the pt was in pain and was initially supposed to go to [facility]. When EMS arrived, pt stated he was no longer in pain and only wanted to be seen in [facility] [place] office. EMS stated the pt was threatening SI if taken to hospital. Vinnie Level had already paged on call doctor and PA at [facility] to be expecting pt's arrival. EMS said they will try to convince pt to go to [facility] before disconnecting the line.

## 2023-01-19 NOTE — Progress Notes (Signed)
[facility] did return this CSW call back. The patient may return once DC.

## 2023-01-19 NOTE — ED Triage Notes (Signed)
Pt bib ems form [facility]; c/o bilateral LE pain, worse today; Dx DM; wound to L foot; per ems, pt has scheduled appointment to discuss bilateral leg amputation on 4/9, no circulation in LE; vascular and vein aware of pt visit to ED today; pt also endorsing SI due to pain, asking ems "if I take all of my eliquis and ASA how long will it take to die"; no prior attempts; took prescribed dose of tylenol and norco at home PTA; capnography 20s, HR 70s, BP 130/62, T 97.1 F, RR 20

## 2023-01-19 NOTE — H&P (Signed)
History and Physical   Steve [name]. [id number] DOB: 1940/12/04 DOA: 01/19/2023  PCP: System, Provider Not In   [facility]  Chief Complaint: Leg pain / foot pain  HPI: Steve [name]. is a 82 y.o. male with medical history significant of diabetes, PAD status post femoropopliteal bypass, hypertension, hyperlipidemia, systolic and diastolic CHF, chronic QTc, atrial fibrillation, PAT, GERD, CAD status post CABG, CKD 4 presenting with ongoing intractable left foot/leg pain.  Patient has significant arterial disease and is status post femoropopliteal bypass in 2023.  Has been followed by vascular surgery and had recent arteriogram with results showing not a candidate for further revascularization intervention.  Likely a candidate for bilateral AKA only.  Has had significant increase in his pain and presented to the ED from his ALF for further evaluation.  While in the ED he had stated initially that at times he felt like he does not want to live but later denied suicidal ideation.  And denies any history of attempts.   While in the ED he was seen by palliative care and on further discussion with them has been made DNR/DNI with plans for transition to hospice care.  Is still having pain in the ED despite morphine.  Hospice care would not be able to be arranged this evening.  EDP has also discussed his case with psychiatry who are recommending evaluation for suicidality prior to discharge.  Unlikely to happen until late this evening in the ED and would not see psychiatry unless admitted.   Patient initially was declining antibiotics but was agreeable to 1 dose while in the ED.  He reports he would like to work on getting his pain controlled and then proceeding with focusing on comfort as his pain is affecting his quality life but he feels that ultimately a bilateral leg amputation at age 54 would be even worse for his quality of life.  He denies fevers, chills, chest  pain, shortness of breath, abdominal pain, constipation, diarrhea, nausea, vomiting.  ED Course: Vital signs significant for blood pressure in the 140s, heart rate in the 50s to 60s.  Lab workup included CMP with sodium 132, bicarb 18, creatinine stable 2.09, calcium 8.5, albumin 2.9.  CBC with hemoglobin of 8.1 from a baseline in the 9-10 range.  Received morphine and Zosyn in the ED.  Vascular surgery consulted in the ED and reiterated he is not a candidate for further revascularization and would be a candidate for bilateral AKA but this would need to be next week.  Palliative care consulted in the ED and after discussion with patient and family have transition patient to hospice/comfort care.  Plan to arrange hospice at facility tomorrow.  Psychiatry consulted in the ED and request consulted and patient to clear patient from possible suicidality standpoint.  Review of Systems: As per HPI otherwise all other systems reviewed and are negative.  Past Medical History:  Diagnosis Date   Atrial fibrillation ([place]) 12/15/2018   CAD (coronary artery disease)    a. s/p CABG x 29 Nov 2011 with LIMA to LAD, SVG to OM1 and distal LCX, SVG to ramus intermediate   Dehydration with hyponatremia 06/25/2022   Essential hypertension    Hyperlipidemia    Intermittent claudication ([place])    Ischemic cardiomyopathy    a. 03/2015 Echo: EF45-50%, Gr1 DD, mild MR.   Metabolic acidosis [id number]   Near syncope 04/01/2015   PAF (paroxysmal atrial fibrillation) ([place])    a. post  op atrial fib 11/2011; short course of amiodarone; stopped 01/25/12   Peripheral arterial disease ([place])    Right bundle branch block    Type II diabetes mellitus [facility])     Past Surgical History:  Procedure Laterality Date   ABDOMINAL AORTOGRAM W/LOWER EXTREMITY Left 07/02/2022   Procedure: ABDOMINAL AORTOGRAM W/LOWER EXTREMITY;  Surgeon: Cherre Robins, MD;  Location: [place] CV LAB;  Service: Cardiovascular;  Laterality: Left;    ABDOMINAL AORTOGRAM W/LOWER EXTREMITY N/A 01/10/2023   Procedure: ABDOMINAL AORTOGRAM W/LOWER EXTREMITY;  Surgeon: Waynetta Sandy, MD;  Location: [place] CV LAB;  Service: Cardiovascular;  Laterality: N/A;   AMPUTATION Left 07/09/2022   Procedure: PARTIAL LEFT FIFTH RAY AMPUTATION;  Surgeon: Edrick Kins, DPM;  Location: [place];  Service: Podiatry;  Laterality: Left;   BIOPSY  07/21/2022   Procedure: BIOPSY;  Surgeon: Wilford Corner, MD;  Location: [place];  Service: Gastroenterology;;   Vilinda Blanks GRAFT FEMORAL-PERONEAL Left 07/07/2022   Procedure: LEFT FEMORAL-PERONEAL ARTERY BYPASS;  Surgeon: Cherre Robins, MD;  Location: [place];  Service: Vascular;  Laterality: Left;   CARDIAC CATHETERIZATION  01/16/1999   Est. EF of 65% -- Nonobstruction atherosclerotic coronary artery disease -- Normal left ventricular function       CARDIAC ELECTROPHYSIOLOGY STUDY AND ABLATION  05/21/1999   Normal sinus funtion -- Mildly prolonged interatrial conduction times -- Normal A-V node funtion -- Normal His Purkinje system function -- fNo accessory pathway -- No inducible ventricular tachycardia in the presence of the or in the absence of isoproterenol with programmed stimulation or with burst pacing -- Nikki Dom, M.D. [place]   CORONARY ARTERY BYPASS GRAFT  12/10/2011   Procedure: CORONARY ARTERY BYPASS GRAFTING (CABG);  Surgeon: Tharon Aquas Adelene Idler, MD;  Location: [place];  Service: Open Heart Surgery;  Laterality: N/A;  Coronary Artery bypass graft on pump times four utilizing left internal mammary artery and left saphenous vein harvested endoscopically   ESOPHAGOGASTRODUODENOSCOPY N/A 07/21/2022   Procedure: ESOPHAGOGASTRODUODENOSCOPY (EGD);  Surgeon: Wilford Corner, MD;  Location: [place];  Service: Gastroenterology;  Laterality: N/A;   LEFT HEART CATH AND CORS/GRAFTS ANGIOGRAPHY N/A 12/18/2018   Procedure: LEFT HEART CATH AND CORS/GRAFTS  ANGIOGRAPHY;  Surgeon: Belva Crome, MD;  Location: [place] CV LAB;  Service: Cardiovascular;  Laterality: N/A;   LEFT HEART CATHETERIZATION WITH CORONARY ANGIOGRAM N/A 12/09/2011   Procedure: LEFT HEART CATHETERIZATION WITH CORONARY ANGIOGRAM;  Surgeon: Peter M Martinique, MD;  Location: [facility] CATH LAB;  Service: Cardiovascular;  Laterality: N/A;   LOWER EXTREMITY ANGIOGRAM Left 07/28/2018   Procedure: Lower Extremity Angiogram;  Surgeon: Angelia Mould, MD;  Location: [place] CV LAB;  Service: Cardiovascular;  Laterality: Left;    Social History  reports that he quit smoking about 43 years ago. His smoking use included cigarettes. He has a 30.00 pack-year smoking history. He has quit using smokeless tobacco. He reports current alcohol use of about 4.0 standard drinks of alcohol per week. He reports that he does not use drugs.  Allergies  Allergen Reactions   Januvia [Sitagliptin] Other (See Comments)    Soreness to stomach area. Intolerance    Statins Other (See Comments)    Myalgias, muscle weakness, swelling, pain- prescribed Lipitor in 06/2022 (??)    Family History  Problem Relation Age of Onset   Stroke Mother 39       Cerebellar hemorrhage  Reviewed  on admission  Prior to Admission medications   Medication Sig Start Date End Date Taking? Authorizing Provider  acetaminophen (TYLENOL) 500 MG tablet Take 500 mg by mouth every 6 (six) hours as needed for moderate pain.    [provider]  apixaban (ELIQUIS) 2.5 MG TABS tablet Take 2.5 mg by mouth 2 (two) times daily. 11/23/21   [provider]  blood glucose meter kit and supplies Dispense based on patient and insurance preference. Use up to four times daily as directed. (FOR ICD-10 E10.9, E11.9). 04/18/19   Cristal Ford, DO  Cholecalciferol (VITAMIN D3) 50 MCG (2000 UT) capsule Take 2,000 Units by mouth daily. 11/23/21   [provider]  cyanocobalamin (VITAMIN B12) 1000 MCG tablet Take 1,000  mcg by mouth daily.    [provider]  doxazosin (CARDURA) 2 MG tablet Take 2 mg by mouth daily. 11/30/21   [provider]  ezetimibe (ZETIA) 10 MG tablet Take 1 tablet (10 mg total) by mouth daily. 07/24/22   Thurnell Lose, MD  HYDROcodone-acetaminophen (NORCO/VICODIN) 5-325 MG tablet Take 1 tablet by mouth every 8 (eight) hours as needed for moderate pain. 12/27/22   [provider]  hydrOXYzine (ATARAX) 10 MG tablet Take 10 mg by mouth 2 (two) times daily as needed for itching. 11/08/22   [provider]  insulin detemir (LEVEMIR) 100 UNIT/ML injection Inject 10 Units into the skin every morning. Hold if BS is LESS than 70 or GREATER than 300    [provider]  Insulin Syringe-Needle U-100 (INSULIN SYRINGE .3CC/31GX5/16") 31G X 5/16" 0.3 ML MISC Use with insulin 04/18/19   Mikhail, Velta Addison, DO  isosorbide mononitrate (IMDUR) 30 MG 24 hr tablet Take 1 tablet (30 mg total) by mouth daily. 07/13/22   Kathie Dike, MD  metoprolol succinate (TOPROL-XL) 100 MG 24 hr tablet Take 1 tablet (100 mg total) by mouth daily. Take with or immediately following a meal. 12/19/18   Reino Bellis B, NP  pantoprazole (PROTONIX) 40 MG tablet Take 1 tablet (40 mg total) by mouth 2 (two) times daily. 07/24/22   Thurnell Lose, MD  pravastatin (PRAVACHOL) 40 MG tablet Take 40 mg by mouth at bedtime.    [provider]  tamsulosin (FLOMAX) 0.4 MG CAPS capsule Take 0.4 mg by mouth daily.    [provider]  traMADol (ULTRAM) 50 MG tablet Take 50 mg by mouth every 8 (eight) hours as needed for moderate pain. 11/01/22   [provider]  amLODipine (NORVASC) 10 MG tablet TAKE 1 TABLET DAILY Patient not taking: Reported on 05/20/2020 11/09/19 05/20/20  Lendon Colonel, NP    Physical Exam: Vitals:   01/19/23 1542 01/19/23 1600 01/19/23 1615 01/19/23 1623  BP:  (!) 157/64 (!) 137/55   Pulse:  71 65   Resp:  17 20   Temp: 97.8 F (36.6 C)      TempSrc: Oral     SpO2:  (!) 89% 99%   Weight:    83.5 kg  Height:    5\' 9"  (1.753 m)    Physical Exam Constitutional:      General: He is not in acute distress.    Appearance: Normal appearance.  HENT:     Head: Normocephalic and atraumatic.     Mouth/Throat:     Mouth: Mucous membranes are moist.     Pharynx: Oropharynx is clear.  Eyes:     Extraocular Movements: Extraocular movements intact.     Pupils: Pupils  are equal, round, and reactive to light.  Cardiovascular:     Rate and Rhythm: Normal rate and regular rhythm.     Pulses: Normal pulses.     Heart sounds: Normal heart sounds.  Pulmonary:     Effort: Pulmonary effort is normal. No respiratory distress.     Breath sounds: Normal breath sounds.  Abdominal:     General: Bowel sounds are normal. There is no distension.     Palpations: Abdomen is soft.     Tenderness: There is no abdominal tenderness.  Musculoskeletal:        General: Swelling present.     Comments: Bilateral skin changes consistent with vascular disease.  Left lower extremity with significant wounds, erythema.  Skin:    General: Skin is warm and dry.  Neurological:     General: No focal deficit present.     Mental Status: Mental status is at baseline.    Labs on Admission: I have personally reviewed following labs and imaging studies  CBC: Recent Labs  Lab 01/19/23 1235  WBC 8.6  HGB 8.1*  HCT 24.7*  MCV 95.7  PLT 123XX123    Basic Metabolic Panel: Recent Labs  Lab 01/19/23 1235  NA 132*  K 3.5  CL 101  CO2 18*  GLUCOSE 87  BUN 23  CREATININE 2.09*  CALCIUM 8.5*    GFR: Estimated Creatinine Clearance: 26.8 mL/min (A) (by C-G formula based on SCr of 2.09 mg/dL (H)).  Liver Function Tests: Recent Labs  Lab 01/19/23 1235  AST 14*  ALT 9  ALKPHOS 63  BILITOT 0.8  PROT 6.8  ALBUMIN 2.9*    Urine analysis:    Component Value Date/Time   COLORURINE YELLOW 07/18/2022 1352   APPEARANCEUR HAZY (A) 07/18/2022 1352    LABSPEC 1.015 07/18/2022 1352   PHURINE 5.0 07/18/2022 1352   GLUCOSEU NEGATIVE 07/18/2022 1352   HGBUR MODERATE (A) 07/18/2022 1352   BILIRUBINUR NEGATIVE 07/18/2022 1352   KETONESUR NEGATIVE 07/18/2022 1352   PROTEINUR 100 (A) 07/18/2022 1352   UROBILINOGEN 1.0 04/01/2015 0900   NITRITE NEGATIVE 07/18/2022 1352   LEUKOCYTESUR MODERATE (A) 07/18/2022 1352    Radiological Exams on Admission: DG Foot 2 Views Left  Result Date: 01/19/2023 CLINICAL DATA:  wounds known poor circulation EXAM: LEFT FOOT - 2 VIEW COMPARISON:  07/09/2022 FINDINGS: Remote amputation of the fifth ray at the distal metatarsal diaphyseal level. Nonspecific heterogeneous density along the distal flap margin is probably incidental and less likely to represent gas in the soft tissues. Substantial vascular calcification noted. Faint density along the medial margin of the great toe and fifth metatarsal, probably from bandaging. No bony destructive findings characteristic of active osteomyelitis identified. IMPRESSION: 1. 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 No bony destructive findings characteristic of active osteomyelitis. 2. Substantial vascular calcification. 3. Probable bandaging along the medial distal foot and great toe. Electronically Signed   By: Van Clines M.D.   On: 01/19/2023 12:17    EKG: Not performed in emergency department  Assessment/Plan Principal Problem:   Intractable pain Active Problems:   Hypertension   Hyperlipidemia   Hx of CABG   CAD (coronary artery disease)   PAF (paroxysmal atrial fibrillation) (HCC)   Essential hypertension   Type II diabetes mellitus (H[facility])   Normocytic anemia   CKD (chronic kidney disease), symptom management only, stage 4 (severe) (H[facility])   Prolonged QT interval   Chronic combined systolic and diastolic CHF (congestive heart  failure) ([place])   PAD (peripheral artery disease) (H[facility])   Vascular insufficiency of extremity    Left foot infection   Pain   Goals of care, counseling/discussion   Palliative care by specialist   Intractable leg pain ?Wound infection Peripheral arterial disease with bilateral critical limb ischemia ?Suicidal ideation Transition to comfort care > Patient presenting with worsening leg pain in the setting of known severe PAD no longer amenable to further revascularization. > Pain not controlled with initial IV pain medication.  Concern for possible infection though there is no leukocytosis at this time and no evidence of osteomyelitis on x-ray.  Could be a degree of wound infection/cellulitis with surrounding erythema however this could be secondary to vascular changes only. > Vascular surgery consulted and currently only option remains bilateral AKA's. > Palliative care consulted and patient opted to transition to comfort care. > Psych consulted in the ED and recommended consult clearance and sitter due to concerns for initial possible suicidality. - Monitor on MedSurg unit overnight - Appreciate recommendations of vascular surgery, palliative care, psychiatry - Will get formal inpatient psychiatry consult to clear patient to return to facility with hospice - Social work and palliative care team assisting with arranging hospice at facility which hopefully will be available tomorrow - Will continue with home Eliquis, Zetia, pravastatin for now - Received 1 dose of antibiotics - Continue with pain control as ordered by palliative care team as needed - Continue with further comfort measures ordered by count of care team to include as needed Ativan, Zofran, Benadryl  Diabetes - SSI  CAD > Status post CABG - Continue with home Imdur, metoprolol, Zetia, pravastatin, Eliquis for now  Chronic combined systolic and diastolic CHF > Last echo was in 2023 with EF of 35-40%, G2 DD, normal RV function. - Continue with home Imdur, metoprolol for now - Not currently on a diuretic  A-fib -  Continue home metoprolol and Eliquis  Anemia > Hemoglobin remains near baseline at 8.1. - Trend CBC  CKD 4 > Creatinine stable 2.09. - Continue to trend renal function and electrolytes.  DVT prophylaxis: Eliquis Code Status:   DNR/DNI, comfort measures Family Communication:  Son has been updated by palliative care and EDP.  I was sitting next to EDP during part of her discussion with the son as well. Disposition Plan:   Patient is from:  [employer]  Anticipated DC to:  Same as above, with hospice  Anticipated DC date:  Tomorrow  Anticipated DC barriers: Arrangement of hospice at facility  Consults called:  In the ED: Vascular surgery, palliative care, psychiatry (repeat consult placed once inpatient) Admission status:  Observation, MedSurg  Severity of Illness: The appropriate patient status for this patient is OBSERVATION. Observation status is judged to be reasonable and necessary in order to provide the required intensity of service to ensure the patient's safety. The patient's presenting symptoms, physical exam findings, and initial radiographic and laboratory data in the context of their medical condition is felt to place them at decreased risk for further clinical deterioration. Furthermore, it is anticipated that the patient will be medically stable for discharge from the hospital within 2 midnights of admission.    Marcelyn Bruins MD [facility]  How to contact the [facility] Attending or Consulting provider [place] or covering provider during after hours [place], for this patient?   Check the care team in [facility] and look for a) attending/consulting [facility] provider listed and b) the [facility] team listed  Log into [email or web address] and use [place] universal password to access. If you do not have the password, please contact the hospital operator. Locate the [facility] provider you are looking for under [facility] and page to a number that you can be directly reached. If you still  have difficulty reaching the provider, please page the [facility] (Director on Call) for the Hospitalists listed on amion for assistance.  01/19/2023, 4:30 PM

## 2023-01-19 NOTE — Consult Note (Addendum)
Consultation Note Date: 01/19/2023   Patient Name: Steve [name].  DOB: Oct 20, 1941  MRN: [id number]  Age / Sex: 82 y.o., male  PCP: System, Provider Not In Referring Physician: Pattricia Boss, MD  Reason for Consultation: Establishing goals of care and Pain control  HPI/Patient Profile: 82 y.o. 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 Stanford Breed on 07/07/2022, arteriogram on 01/10/2023 indicating unreconstructable disease bilaterally, admitted on 01/19/2023 with severe pain in both legs.   Vascular surgery has seen the patient and advised need for bilateral AKA's.  Patient has also made suicidal comments in the ED upon presentation due to severe pain. PMT has been consulted to assist with pain management and goals of care conversation.  Clinical Assessment and Goals of Care:  I have reviewed medical records including EPIC notes, labs and imaging, received report from RN, assessed the patient and then had a phone conversation with patient's son [name] to discuss diagnosis prognosis, [place], EOL wishes, disposition and options.  I introduced Palliative Medicine as specialized medical care for people living with serious illness. It focuses on providing relief from the symptoms and stress of a serious illness. The goal is to improve quality of life for both the patient and the family.  We discussed a brief life review of the patient and then focused on their current illness.  The natural disease trajectory and expectations at EOL were discussed.  I attempted to elicit values and goals of care important to the patient.    Medical History Review and Understanding:  Patient understands the severity of his illness and treatment options being  presented.  He is very hesitant about proceeding with AKA's.  Social History: Patient is widowed after being married to his wife for 65 years.  He has resided at [facility] since October 2023.  He was in the ILF side of the facility before then.  He has 1 living son.  He was previously in the [employer].  He enjoys playing bingo but lately it has been hard to enjoy anything due to his pain. He believes in God.  Functional and Nutritional State: Patient reports eating breakfast of eggs and sausage, most days skipping lunch and dinner due to lack of appetite.  Albumin of 2.2 noted in September 2023.  He reports he is not able to get up or do anything at all.  He is sleeping in his recliner now due to leg pain while laying flat in bed.    Palliative Symptoms: Pain, denies other symptoms including anxiety or depression  Code Status: Concepts specific to code status, artifical feeding and hydration, and rehospitalization were considered and discussed. Recommended consideration of DNR/DNI status, understanding evidenced-based poor outcomes in similar hospitalized patients, as the cause of the arrest is likely associated with chronic/terminal disease rather than a reversible acute cardio-pulmonary event.  Patient is agreeable.  Discussion: Met with patient at the bedside, who initially states "I know why you are here, it's because I said those things will kill myself."  Provided reassurance that palliative care was asked to join his care team as an extra layer of support as he is faced with difficult decision making and poorly controlled symptoms. Patient reports his bilateral leg pain was 5-8/10 at baseline until yesterday.  Today it is 10/10.  He is hesitant about morphine, as he feels that both  of his parents were killed with excessive amounts of morphine used.  He does also state he is willing to try anything that would help him find relief from this pain.  Upon revisit to the bedside later on, he tells me the  first dose of PRN IV morphine has helped pain decrease by about 40%. We discussed his options moving forward including bilateral amputations with risk of poor wound healing and poor tolerance of rehabilitation given his age and debility, as well as poor functional and nutritional status.  We discussed the importance of prioritizing what he is trying to get out of his treatment.  His sole goal is to relieve the pain.  We discussed alternative options to achieve this including hospice care, as hospice could manage his pain at his facility with emphasis on his peace, dignity, and quality of life.  He tells me he would prefer this option over surgery, as long as his pain is actually controlled.  I offered to discuss with his son as well and he is appreciative. Patient refused antibiotics when RN attempted to hang.  I then reviewed the option of comfort care given his feeling that antibiotics cause him more GI distress than benefit. Counseled that patient would no longer receive aggressive medical interventions such as continuous vital signs, lab work, radiology testing, or medications not focused on comfort. All care would focus on how the patient is looking and feeling. This would include management of any symptoms that may cause discomfort, pain, shortness of breath, cough, nausea, agitation, anxiety, and/or secretions etc. Symptoms would be managed with medications and other non-pharmacological interventions such as spiritual support if requested, repositioning, music therapy, or therapeutic listening. After my conversation with patient's son, I reviewed the above information and shared my recommendations for consideration of hospice.  Patient's son thinks that this would be a good support for him and emphasizes that he is agreeable with any of patient's decisions, as he is still in his sound mind.  Reviewed the hospice referral process.  I then returned the bedside to update the patient and he is appreciative.   He shares that he is not willing to try 1 dose of antibiotics.    The difference between aggressive medical intervention and comfort care was considered in light of the patient's goals of care. Hospice and Palliative Care services outpatient were explained and offered.   Discussed the importance of continued conversation with family and the medical providers regarding overall plan of care and treatment options, ensuring decisions are within the context of the patient's values and GOCs.   Questions and concerns were addressed.  The family was encouraged to call with questions or concerns.  PMT will continue to support holistically.   SUMMARY OF RECOMMENDATIONS   -Code status updated to DNR/DNI -Transition to comfort care after discussion with patient and his son -NO amputations -Patient's goal is to focus on his quality of life and pain management, return back to his facility soon as he can -May have comfort feeding -Psychosocial and emotional support provided -PMT will continue to follow and support  Symptom management: Morphine 2mg  IV PRN Q2H for severe pain/air hunger/comfort Oxycodone 10mg  PO PRN Q4H for moderate pain Ativan PRN for agitation/anxiety Zofran PRN for nausea Liquifilm tears PRN for dry eyes   Prognosis:  < 6 months  Discharge Planning:  ALF with hospice        Primary Diagnoses: Present on Admission: **None**  Physical Exam Vitals and nursing note reviewed.  Cardiovascular:  Rate and Rhythm: Normal rate.  Pulmonary:     Effort: Pulmonary effort is normal. No respiratory distress.  Skin:    General: Skin is warm and dry.     Findings: Wound present.     Comments: L foot  Neurological:     Mental Status: He is alert and oriented to person, place, and time.  Psychiatric:        Thought Content: Thought content includes suicidal ideation.        Cognition and Memory: Cognition and memory normal.    Vital Signs: BP (!) 150/59   Pulse 67   Temp  98.1 F (36.7 C) (Oral)   Resp 15   SpO2 99%  Pain Scale: 0-10   Pain Score: 10-Worst pain ever   SpO2: SpO2: 99 % O2 Device:SpO2: 99 % O2 Flow Rate: .    Palliative Assessment/Data: 50%      Total time: I spent 93 minutes in the care of the patient today in the above activities and documenting the encounter.  MDM: High   Jhayden Demuro Johnnette Litter, PA-C  Palliative Medicine Team Team phone # [phone]  Thank you for allowing the Palliative Medicine Team to assist in the care of this patient. Please utilize secure chat with additional questions, if there is no response within 30 minutes please call the above phone number.  Palliative Medicine Team providers are available by phone from 7am to 7pm daily and can be reached through the team cell phone.  Should this patient require assistance outside of these hours, please call the patient's attending physician.

## 2023-01-19 NOTE — Progress Notes (Signed)
CSW reached out to [place] with [facility] who will reach out to patient and son about home hospice. CSW also spoke to patients son Steve [name] who agrees with the plan of discharging with home hospice. Patient will be returning to [facility] assisted living.

## 2023-01-20 DIAGNOSIS — R45851 Suicidal ideations: Secondary | ICD-10-CM

## 2023-01-20 DIAGNOSIS — I70221 Atherosclerosis of native arteries of extremities with rest pain, right leg: Secondary | ICD-10-CM

## 2023-01-20 DIAGNOSIS — E119 Type 2 diabetes mellitus without complications: Secondary | ICD-10-CM | POA: Diagnosis not present

## 2023-01-20 DIAGNOSIS — L089 Local infection of the skin and subcutaneous tissue, unspecified: Secondary | ICD-10-CM | POA: Diagnosis not present

## 2023-01-20 DIAGNOSIS — Z789 Other specified health status: Secondary | ICD-10-CM

## 2023-01-20 DIAGNOSIS — R52 Pain, unspecified: Secondary | ICD-10-CM | POA: Diagnosis not present

## 2023-01-20 DIAGNOSIS — M79672 Pain in left foot: Secondary | ICD-10-CM | POA: Diagnosis not present

## 2023-01-20 DIAGNOSIS — I70249 Atherosclerosis of native arteries of left leg with ulceration of unspecified site: Secondary | ICD-10-CM

## 2023-01-20 DIAGNOSIS — Z66 Do not resuscitate: Secondary | ICD-10-CM

## 2023-01-20 DIAGNOSIS — Z794 Long term (current) use of insulin: Secondary | ICD-10-CM

## 2023-01-20 DIAGNOSIS — I998 Other disorder of circulatory system: Secondary | ICD-10-CM | POA: Diagnosis not present

## 2023-01-20 LAB — GLUCOSE, CAPILLARY
Glucose-Capillary: 114 mg/dL — ABNORMAL HIGH (ref 70–99)
Glucose-Capillary: 162 mg/dL — ABNORMAL HIGH (ref 70–99)

## 2023-01-20 NOTE — TOC Transition Note (Signed)
Transition of Care [facility]) - CM/SW Discharge Note   Patient Details  Name: Steve [name]. MRN: [id number] Date of Birth: 06-29-1941  [facility] CM/SW Contact:  Coralee Pesa, [place] Phone Number: 01/20/2023, 10:42 AM   Clinical Narrative:     Pt to be transported to [place] via [place]. Nurse to call report to [phone]. [facility] to follow for hospice services.   Final next level of care: Assisted Living Barriers to Discharge: Barriers Resolved   Patient Goals and CMS Choice      Discharge Placement                Patient chooses bed at: [facility] Patient to be transferred to facility by: [place] Name of family member notified: N/A Patient and family notified of of transfer: 01/20/23  Discharge Plan and Services Additional resources added to the After Visit Summary for                                       Social Determinants of Health (SDOH) Interventions SDOH Screenings   Food Insecurity: No Food Insecurity (01/19/2023)  Housing: Low Risk  (01/19/2023)  Transportation Needs: No Transportation Needs (01/19/2023)  Utilities: Not At Risk (01/19/2023)  Tobacco Use: Medium Risk (01/19/2023)     Readmission Risk Interventions    07/22/2022   11:13 AM 07/13/2022   10:22 AM  Readmission Risk Prevention Plan  Transportation Screening Complete Complete  PCP or Specialist Appt within 3-5 Days  Complete  HRI or [place]  Complete  Social Work Consult for [place] Planning/Counseling  Complete  Palliative Care Screening  Not Applicable  Medication Review Press photographer) Complete Complete  PCP or Specialist appointment within 3-5 days of discharge Complete   HRI or [place] Complete   SW Recovery Care/Counseling Consult Complete   Palliative Care Screening Not Applicable   Skilled Nursing Facility Complete

## 2023-01-20 NOTE — Discharge Summary (Signed)
Discharge Summary  [name] [id number] DOB: [birth year]  PCP: System, Provider Not In  Admit date: 01/19/2023 Discharge date: 01/20/2023    Time spent: 65mins, more than 50% time spent on coordination of care. Son updated over the phone  Recommendations for Outpatient Follow-up:  F/u with hospice at ALF    Discharge Diagnoses:  Active Hospital Problems   Diagnosis Date Noted   Intractable pain 01/19/2023   Vascular insufficiency of extremity 01/19/2023   Left foot infection 01/19/2023   Pain 01/19/2023   Goals of care, counseling/discussion 01/19/2023   Palliative care by specialist 01/19/2023   PAD (peripheral artery disease) ([place]) 01/10/2023   Chronic combined systolic and diastolic CHF (congestive heart failure) ([place]) 07/08/2022   Prolonged QT interval 07/03/2022   CKD (chronic kidney disease), symptom management only, stage 4 (severe) ([place]) 09/08/2018   Normocytic anemia 07/24/2018   Type II diabetes mellitus (HCC)    PAF (paroxysmal atrial fibrillation) (HCC)    Essential hypertension    CAD (coronary artery disease)    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    Discharge Condition: stable  Diet recommendation: carb modified  Filed Weights   01/19/23 1623  Weight: 83.5 kg    History of present illness:  Patient coming from: [facility]   Chief Complaint: Leg pain / foot pain   HPI: Rees Fatemi. is a 82 y.o. male with medical history significant of diabetes, PAD status post femoropopliteal bypass, hypertension, hyperlipidemia, systolic and diastolic CHF, chronic QTc, atrial fibrillation, PAT, GERD, CAD status post CABG, CKD 4 presenting with ongoing intractable left foot/leg pain.   Patient has significant arterial disease and is status post femoropopliteal bypass in 2023.  Has been followed by vascular surgery and had recent arteriogram with results  showing not a candidate for further revascularization intervention.  Likely a candidate for bilateral AKA only.   Has had significant increase in his pain and presented to the ED from his ALF for further evaluation.  While in the ED he had stated initially that at times he felt like he does not want to live but later denied suicidal ideation.  And denies any history of attempts.    While in the ED he was seen by palliative care and on further discussion with them has been made DNR/DNI with plans for transition to hospice care.  Is still having pain in the ED despite morphine.  Hospice care would not be able to be arranged this evening.   EDP has also discussed his case with psychiatry who are recommending evaluation for suicidality prior to discharge.  Unlikely to happen until late this evening in the ED and would not see psychiatry unless admitted.    Patient initially was declining antibiotics but was agreeable to 1 dose while in the ED.  He reports he would like to work on getting his pain controlled and then proceeding with focusing on comfort as his pain is affecting his quality life but he feels that ultimately a bilateral leg amputation at age 25 would be even worse for his quality of life.   He denies fevers, chills, chest pain, shortness of breath, abdominal pain, constipation, diarrhea, nausea, vomiting.   ED Course: Vital signs significant for blood pressure in the 140s, heart rate in the 50s to 60s.  Lab workup included CMP with sodium 132, bicarb 18, creatinine  stable 2.09, calcium 8.5, albumin 2.9.  CBC with hemoglobin of 8.1 from a baseline in the 9-10 range.  Received morphine and Zosyn in the ED.   Vascular surgery consulted in the ED and reiterated he is not a candidate for further revascularization and would be a candidate for bilateral AKA   Palliative care consulted in the ED and after discussion with patient and family have transition patient to hospice/comfort care.  Plan to  arrange hospice at facility tomorrow.  Hospital Course:  Principal Problem:   Intractable pain Active Problems:   Hypertension   Hyperlipidemia   Hx of CABG   CAD (coronary artery disease)   PAF (paroxysmal atrial fibrillation) (HCC)   Essential hypertension   Type II diabetes mellitus (HCC)   Normocytic anemia   CKD (chronic kidney disease), symptom management only, stage 4 (severe) (HCC)   Prolonged QT interval   Chronic combined systolic and diastolic CHF (congestive heart failure) (HCC)   PAD (peripheral artery disease) (HCC)   Vascular insufficiency of extremity   Left foot infection   Pain   Goals of care, counseling/discussion   Palliative care by specialist   Assessment and Plan:  Critical limb ischemia multiple nonhealing wounds of the left foot and rest pain in the right foot  Seen by vascular surgery who recommended not a candidate for further revascularization and would be a candidate for bilateral AKA  , patient declined surgery, multiple conversation with EDP, palliative care, vascular surgery , admitting MD and me , patient is clear that he does not want to have surgery, he wants to treat pain and wants to be on hospice service, there was some comment of him being suicidal, he denies being SI, he denies being suicidal, He states" I said that due to pain, I wants to be treated for the pain."  on full comfort measures, focus on pain control Dry dressings to left leg wounds daily.  Please use 4 x 4's Curlex and a 4 inch Ace.  [facility]  IDDM2 CAD s/p CABG Chronic combined CHF AFib CKD4/anemia of chronic disease Continue all home meds Follow up with hospice service     Discharge Exam: BP (!) 148/55 (BP Location: Left Arm)   Pulse 66   Temp 98.3 F (36.8 C) (Oral)   Resp 20   Ht 5\' 9"  (1.753 m)   Wt 83.5 kg   SpO2 100%   BMI 27.18 kg/m   General: NAD, AAOX4 Cardiovascular: RRR Respiratory: normal respiratory effort      Discharge Instructions     Diet Carb Modified   Complete by: As directed    Discharge wound care:   Complete by: As directed    Dry dressings to left leg wounds daily.  Please use 4 x 4's Curlex and a 4 inch Ace.   Increase activity slowly   Complete by: As directed       Allergies as of 01/20/2023       Reactions   Januvia [sitagliptin] Other (See Comments)   Abdominal pain   Statins Other (See Comments)   Myalgias Muscle weakness Swelling Pain   Keflex [cephalexin] Itching        Medication List     TAKE these medications    acetaminophen 500 MG tablet Commonly known as: TYLENOL Take 500 mg by mouth every 6 (six) hours as needed for moderate pain.   apixaban 2.5 MG Tabs tablet Commonly known as: ELIQUIS Take 2.5 mg by  mouth 2 (two) times daily.   cyanocobalamin 1000 MCG tablet Commonly known as: VITAMIN B12 Take 1,000 mcg by mouth daily.   doxazosin 2 MG tablet Commonly known as: CARDURA Take 2 mg by mouth daily.   ezetimibe 10 MG tablet Commonly known as: ZETIA Take 1 tablet (10 mg total) by mouth daily.   gabapentin 300 MG capsule Commonly known as: NEURONTIN Take 300 mg by mouth 2 (two) times daily.   HYDROcodone-acetaminophen 5-325 MG tablet Commonly known as: NORCO/VICODIN Take 1 tablet by mouth every 8 (eight) hours as needed (pain).   hydrOXYzine 10 MG tablet Commonly known as: ATARAX Take 10 mg by mouth 2 (two) times daily as needed for itching.   isosorbide mononitrate 30 MG 24 hr tablet Commonly known as: IMDUR Take 1 tablet (30 mg total) by mouth daily.   Lantus SoloStar 100 UNIT/ML Solostar Pen Generic drug: insulin glargine Inject 10 Units into the skin daily.   loperamide 2 MG tablet Commonly known as: IMODIUM A-D Take 2 mg by mouth every 6 (six) hours as needed for diarrhea or loose stools.   metoprolol succinate 100 MG 24 hr tablet Commonly known as: TOPROL-XL Take 1 tablet (100 mg total) by mouth daily. Take with  or immediately following a meal.   pantoprazole 20 MG tablet Commonly known as: PROTONIX Take 20 mg by mouth 2 (two) times daily.   pantoprazole 40 MG tablet Commonly known as: PROTONIX Take 1 tablet (40 mg total) by mouth 2 (two) times daily.   pravastatin 40 MG tablet Commonly known as: PRAVACHOL Take 40 mg by mouth at bedtime.   tamsulosin 0.4 MG Caps capsule Commonly known as: FLOMAX Take 0.4 mg by mouth daily.   traMADol 50 MG tablet Commonly known as: ULTRAM Take 50 mg by mouth every 8 (eight) hours as needed (pain).   Vitamin D3 50 MCG (2000 UT) Caps Generic drug: Cholecalciferol Take 2,000 Units by mouth daily.               Discharge Care Instructions  (From admission, onward)           Start     Ordered   01/20/23 0000  Discharge wound care:       Comments: Dry dressings to left leg wounds daily.  Please use 4 x 4's Curlex and a 4 inch Ace.   01/20/23 0843           Allergies  Allergen Reactions   Januvia [Sitagliptin] Other (See Comments)    Abdominal pain   Statins Other (See Comments)    Myalgias Muscle weakness Swelling Pain   Keflex [Cephalexin] Itching      The results of significant diagnostics from this hospitalization (including imaging, microbiology, ancillary and laboratory) are listed below for reference.    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 Nonspecific heterogeneous density along the distal flap margin is probably incidental and less likely to represent gas in the soft tissues. Substantial vascular calcification noted. Faint density along the medial margin of the great toe and fifth metatarsal, probably from bandaging. No bony destructive findings characteristic of active osteomyelitis identified.  IMPRESSION: 1. 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 No bony destructive findings characteristic of active osteomyelitis. 2. Substantial vascular calcification. 3.  Probable bandaging along the medial distal foot and great toe. Electronically Signed   By: Van Clines M.D.   On: 01/19/2023 12:17   PERIPHERAL VASCULAR CATHETERIZATION  Result Date: 01/10/2023 Images from the original result were not included. Patient name: Juvens Matton. 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 Donzetta Matters, MD Procedure Performed: 1.  Ultrasound-guided cannulation right common femoral artery 2.  CO2 aortogram and right lower extremity angiogram and left lower extremity angiogram with contrast used below the knee and the left lower extremity 3.  Attempted crossing left SFA occlusion 4.  Moderate sedation with fentanyl and Versed for 55 minutes 5.  Mynx device closure right common femoral artery Indications: 82 year old male with history of a left femoral to peroneal artery bypass with PTFE last September.  This is now known to be occluded after he healed a left fifth toe amputation.  He now has a wound at the left first metatarsal head also has rest pain in the right lower extremity and elevated creatinine to 1.9 on arrival and is indicated for CO2 angiogram with possible intervention. Findings: The aorta and iliac segments are heavily calcified although free of flow-limiting stenosis.  On the right side there is at least 50% stenosis of the right common femoral artery and the SFA is occluded he could not tolerate CO2 to evaluate for any reconstitution but was heavily calcified throughout.  On the left side the SFA is calcified but patent until the adductor canal where it is flush occluded and  reconstitutes below the knee popliteal island and there is a very distal thread of the peroneal artery reconstituted as well.  Attempted to cross the heavily calcified SFA but was unable to get any catheter to pass and ultimately given his significant disease and long segment occlusions with wound I elected to terminate the procedure. Patient will need left above-knee amputation for wound possibly right above-knee amputation for rest pain.  Procedure:  The patient was identified in the holding area and taken to room 8.  The patient was then placed supine on the table and prepped and draped in the usual sterile fashion.  A time out was called.  Ultrasound was used to evaluate the right common femoral artery which was heavily calcified.  The area was anesthetized with 1% lidocaine and cannulated with a micropuncture needle followed by wire and a sheath.  And images saved to the permanent record.  We placed a Bentson wire followed by 5 [place] sheath and Omni catheter to the level of L1 performed CO2 aortogram.  I then crossed the bifurcation with Omni cath and ultimately used Glidewire and quick cross catheter to cross and perform left lower extremity angiography.  I then placed a quick cross catheter down distally in the SFA and attempted contrast angiography which demonstrated the popliteal Allen possibly a distal peroneal artery reconstitution.  I then placed a stiff wire and a 6 French sheath up and over the bifurcation patient was given 9000 units of heparin.  I then attempted to cross the occluded SFA which was heavily calcified using both Glidewire, Glidewire advantage and the back of a Glidewire and I was able to get approximately 6 cm into the occluded area but no catheter including Navi cross would pass.  Patient was having significant pain on the table and I elected that attempted crossing  was futile.  I removed the catheter wire and exchanged for a short 6 [place] sheath.  I then performed a limited right  lower extremity angiography with CO2 but there was no reconstitution identified and patient did not tolerate the CO2 well.  With this I placed a minx device and deployed which deployed well.  There was a small hematoma in the right groin but was not expanding.  Patient will be discharged today with plans for discussion of above-knee amputation in the office in the near future. Contrast: 25cc Brandon C. Donzetta Matters, MD [facility] Office: [phone] Pager: [phone]   VAS [place] ABI WITH/WO TBI  Result Date: 01/03/2023  LOWER EXTREMITY DOPPLER STUDY Patient Name:  Sire C Ortloff JR.  Date of Exam:   01/03/2023 Medical Rec #: [id number]         Accession #:    [id number] Date of Birth: 05-30-1941          Patient Gender: M Patient Age:   32 years Exam Location:  [facility] Procedure:      VAS [place] ABI WITH/WO TBI Referring Phys: CHRISTOPHER DICKSON --------------------------------------------------------------------------------  Indications: Claudication, rest pain, ulceration, and peripheral artery disease.              07/09/22 Left 5th ray amputation High Risk Factors: Hypertension, hyperlipidemia, Diabetes, past history of                    smoking, coronary artery disease.  Vascular Interventions: 07/08/23 Left femoral peroneal artery bypass by Dr Stanford Breed                         07/02/22 Left LE angiogram. Limitations: Today's exam was limited due to bandages. Comparison Study: 07/18/22 Left LE arterial                   06/30/22 Prior ABI Performing Technologist: Elta Guadeloupe RVT, RDMS  Examination Guidelines: A complete evaluation includes at minimum, Doppler waveform signals and systolic blood pressure reading at the level of bilateral brachial, anterior tibial, and posterior tibial arteries, when vessel segments are accessible. Bilateral testing is considered an integral part of a complete examination. Photoelectric Plethysmograph (PPG) waveforms and toe systolic pressure  readings are included as required and additional duplex testing as needed. Limited examinations for reoccurring indications may be performed as noted.  ABI Findings: +---------+------------------+-----+----------+--------+ Right    Rt Pressure (mmHg)IndexWaveform  Comment  +---------+------------------+-----+----------+--------+ Brachial 177                                       +---------+------------------+-----+----------+--------+ PTA                             monophasicCNO      +---------+------------------+-----+----------+--------+ DP                              monophasicCNO      +---------+------------------+-----+----------+--------+ Great Toe0                 0.00                    +---------+------------------+-----+----------+--------+ +--------+------------------+-----+----------+-------+ Left    Lt Pressure (mmHg)IndexWaveform  Comment +--------+------------------+-----+----------+-------+ [id number]                                      +--------+------------------+-----+----------+-------+  PTA     89                0.50 monophasic        +--------+------------------+-----+----------+-------+ DP      83                0.47 monophasic        +--------+------------------+-----+----------+-------+ +-------+-----------+-----------+------------+------------+ ABI/TBIToday's ABIToday's TBIPrevious ABIPrevious TBI +-------+-----------+-----------+------------+------------+ Right  [place]         0          [place]          0.30         +-------+-----------+-----------+------------+------------+ Left   0.50       Bandage    0.38        0.10         +-------+-----------+-----------+------------+------------+  Arterial wall calcification precludes accurate ankle pressures and ABIs. Right ABIs appear essentially unchanged. Right TBIs appear decreased. Left ABI is increased.  Summary: Right: Resting right ankle-brachial index indicates  noncompressible right lower extremity arteries. The right toe-brachial index is abnormal. Left: Resting left ankle-brachial index indicates moderate left lower extremity arterial disease. Left TBI unable to be obtained due to bandages.  *See table(s) above for measurements and observations.  Electronically signed by Harold Barban MD on 01/03/2023 at 4:01:17 PM.    Final    VAS [place] LOWER EXTREMITY BYPASS GRAFT DUPLEX  Result Date: 01/03/2023 LOWER EXTREMITY ARTERIAL DUPLEX STUDY Patient Name:  Christiano Ledman.  Date of Exam:   01/03/2023 Medical Rec #: [id number]         Accession #:    [id number] Date of Birth: 1941-03-09          Patient Gender: M Patient Age:   23 years Exam Location:  [facility] Procedure:      VAS [place] LOWER EXTREMITY BYPASS GRAFT DUPLEX Referring Phys: Harrell Gave DICKSON --------------------------------------------------------------------------------  Indications: Claudication, rest pain, ulceration, peripheral artery disease, and              Patient complains of knee and foot pain. High Risk Factors: Hypertension, hyperlipidemia, Diabetes, past history of                    smoking, coronary artery disease.  Vascular Interventions: 07/08/23 Left femoral peroneal artery bypass by Dr Stanford Breed                         07/02/22 Left LE angiogram. Current ABI:            R [place] L 0.50 Comparison Study: 07/18/22 Left LE arterial                   06/30/22 Prior ABI Performing Technologist: Elta Guadeloupe RVT, RDMS  Examination Guidelines: A complete evaluation includes B-mode imaging, spectral Doppler, color Doppler, and power Doppler as needed of all accessible portions of each vessel. Bilateral testing is considered an integral part of a complete examination. Limited examinations for reoccurring indications may be performed as noted.   +----------+--------+-----+---------------+----------+---------+ LEFT      PSV cm/sRatioStenosis       Waveform  Comments   +----------+--------+-----+---------------+----------+---------+ CFA Distal136                         triphasic           +----------+--------+-----+---------------+----------+---------+ SFA Prox  89  triphasic Turbulent +----------+--------+-----+---------------+----------+---------+ SFA Mid   201          50-74% stenosistriphasic Turbulent +----------+--------+-----+---------------+----------+---------+ SFA Distal45                          monophasic          +----------+--------+-----+---------------+----------+---------+ POP Prox               occluded                           +----------+--------+-----+---------------+----------+---------+ POP Mid                occluded                           +----------+--------+-----+---------------+----------+---------+ POP Distal             occluded                           +----------+--------+-----+---------------+----------+---------+ ATA Distal27                          monophasic          +----------+--------+-----+---------------+----------+---------+ PTA Distal28                          monophasic          +----------+--------+-----+---------------+----------+---------+  Left Graft #1: Left femoral-Peroneal artery bypass +--------------------+--------+--------+----------+------------------+                     PSV cm/sStenosisWaveform  Comments           +--------------------+--------+--------+----------+------------------+ Inflow              136             triphasic                    +--------------------+--------+--------+----------+------------------+ Proximal Anastomosis28      occluded          bidirectional flow +--------------------+--------+--------+----------+------------------+ Proximal Graft              occluded                             +--------------------+--------+--------+----------+------------------+ Mid Graft                    occluded                             +--------------------+--------+--------+----------+------------------+ Distal Graft                occluded                             +--------------------+--------+--------+----------+------------------+ Distal Anastomosis          occluded                             +--------------------+--------+--------+----------+------------------+ Outflow             18              monophasic                   +--------------------+--------+--------+----------+------------------+  Occluded Left fem-peroneal bypass.  Summary: Left: 50-74% stenosis noted in the superficial femoral artery. Total occlusion noted in the popliteal artery. Occluded Left fem-peroneal bypass.  See table(s) above for measurements and observations. Electronically signed by Harold Barban MD on 01/03/2023 at 4:00:58 PM.    Final     Microbiology: Recent Results (from the past 240 hour(s))  Blood Cultures x 2 sites     Status: None (Preliminary result)   Collection Time: 01/19/23 12:21 PM   Specimen: BLOOD  Result Value Ref Range Status   Specimen Description BLOOD RIGHT ANTECUBITAL  Final   Special Requests   Final    BOTTLES DRAWN AEROBIC AND ANAEROBIC Blood Culture adequate volume   Culture   Final    NO GROWTH < 24 HOURS Performed at [facility], [address] [place], [place] [address]    Report Status PENDING  Incomplete  Blood Cultures x 2 sites     Status: None (Preliminary result)   Collection Time: 01/19/23 12:26 PM   Specimen: BLOOD RIGHT FOREARM  Result Value Ref Range Status   Specimen Description BLOOD RIGHT FOREARM  Final   Special Requests   Final    BOTTLES DRAWN AEROBIC AND ANAEROBIC Blood Culture adequate volume   Culture   Final    NO GROWTH < 24 HOURS Performed at [facility], [place] [address], [place], [place] [address]    Report Status PENDING  Incomplete     Labs: Basic Metabolic Panel: Recent Labs  Lab 01/19/23 1235   NA 132*  K 3.5  CL 101  CO2 18*  GLUCOSE 87  BUN 23  CREATININE 2.09*  CALCIUM 8.5*   Liver Function Tests: Recent Labs  Lab 01/19/23 1235  AST 14*  ALT 9  ALKPHOS 63  BILITOT 0.8  PROT 6.8  ALBUMIN 2.9*   No results for input(s): "LIPASE", "AMYLASE" in the last 168 hours. No results for input(s): "AMMONIA" in the last 168 hours. CBC: Recent Labs  Lab 01/19/23 1235  WBC 8.6  HGB 8.1*  HCT 24.7*  MCV 95.7  PLT 165   Cardiac Enzymes: No results for input(s): "CKTOTAL", "CKMB", "CKMBINDEX", "TROPONINI" in the last 168 hours. BNP: BNP (last 3 results) Recent Labs    07/22/22 0505 07/23/22 0419 07/24/22 0438  BNP 1,916.7* 1,239.2* 534.5*    ProBNP (last 3 results) No results for input(s): "PROBNP" in the last 8760 hours.  CBG: Recent Labs  Lab 01/19/23 1738 01/19/23 2113  GLUCAP 102* 111*    FURTHER DISCHARGE INSTRUCTIONS:   Get Medicines reviewed and adjusted: Please take all your medications with you for your next visit with your Primary MD   Laboratory/radiological data: Please request your Primary MD to go over all hospital tests and procedure/radiological results at the follow up, please ask your Primary MD to get all Hospital records sent to his/her office.   In some cases, they will be blood work, cultures and biopsy results pending at the time of your discharge. Please request that your primary care M.D. goes through all the records of your hospital data and follows up on these results.   Also Note the following: If you experience worsening of your admission symptoms, develop shortness of breath, life threatening emergency, suicidal or homicidal thoughts you must seek medical attention immediately by calling 911 or calling your MD immediately  if symptoms less severe.   You must read complete instructions/literature along with all the possible adverse reactions/side effects for  all the Medicines you take and that have been prescribed to you.  Take any new Medicines after you have completely understood and accpet all the possible adverse reactions/side effects.    Do not drive when taking Pain medications or sleeping medications (Benzodaizepines)   Do not take more than prescribed Pain, Sleep and Anxiety Medications. It is not advisable to combine anxiety,sleep and pain medications without talking with your primary care practitioner   Special Instructions: If you have smoked or chewed Tobacco  in the last 2 yrs please stop smoking, stop any regular Alcohol  and or any Recreational drug use.   Wear Seat belts while driving.   Please note: You were cared for by a hospitalist during your hospital stay. Once you are discharged, your primary care physician will handle any further medical issues. Please note that NO REFILLS for any discharge medications will be authorized once you are discharged, as it is imperative that you return to your primary care physician (or establish a relationship with a primary care physician if you do not have one) for your post hospital discharge needs so that they can reassess your need for medications and monitor your lab values.     Signed:  Florencia Reasons MD, PhD, FACP  [facility] 01/20/2023, 8:48 AM

## 2023-01-20 NOTE — Progress Notes (Signed)
Daily Progress Note   Patient Name: Steve [name].       Date: 01/20/2023 DOB: [birth year]  Age: 82 y.o. MRN#: [id number] Attending Physician: Florencia Reasons, MD Primary Care Physician: System, Provider Not In Admit Date: 01/19/2023  Reason for Consultation/Follow-up: Non pain symptom management, Pain control, Psychosocial/spiritual support, and Terminal Care  Subjective: I have reviewed medical records including EPIC notes, MAR, and labs. Received report from primary RN - no acute concerns.   Went to visit patient at bedside - no family/visitors present. Patient was lying in bed asleep - I did not attempt to wake him to preserve comfort. No signs or non-verbal gestures of pain or discomfort noted. No respiratory distress, increased work of breathing, or secretions noted.  Spoke with NT at bedside - patient is not eating, but is drinking. Patient wishes to have salt with food - will adjust diet to reflect comfort feeds, no restrictions.   Per documentation, plan is for patient's discharge back to [facility] with hospice.    Length of Stay: 0  Current Medications: Scheduled Meds:   apixaban  2.5 mg Oral BID   ezetimibe  10 mg Oral Daily   insulin aspart  0-15 Units Subcutaneous TID WC   isosorbide mononitrate  30 mg Oral Daily   metoprolol succinate  100 mg Oral Daily   pravastatin  40 mg Oral QHS   sodium chloride flush  3 mL Intravenous Q12H    Continuous Infusions:   PRN Meds: acetaminophen **OR** acetaminophen, antiseptic oral rinse, diphenhydrAMINE, influenza vaccine adjuvanted, LORazepam **OR** LORazepam, morphine injection, ondansetron **OR** ondansetron (ZOFRAN) IV, oxyCODONE, polyvinyl alcohol  Physical Exam Vitals and nursing note reviewed.  Constitutional:      General:  He is not in acute distress.    Appearance: He is ill-appearing.  Pulmonary:     Effort: No respiratory distress.  Skin:    General: Skin is warm and dry.     Findings: Wound (bilateral feet) present.  Neurological:     Comments: asleep             Vital Signs: BP (!) 171/63 (BP Location: Left Arm)   Pulse 66   Temp 98.2 F (36.8 C) (Oral)   Resp (!) 22   Ht 5\' 9"  (1.753 m)   Wt 83.5 kg  SpO2 100%   BMI 27.18 kg/m  SpO2: SpO2: 100 % O2 Device: O2 Device: Room Air O2 Flow Rate:    Intake/output summary:  Intake/Output Summary (Last 24 hours) at 01/20/2023 1120 Last data filed at 01/20/2023 1030 Gross per 24 hour  Intake 290 ml  Output 1450 ml  Net -1160 ml   LBM: Last BM Date : 01/19/23 Baseline Weight: Weight: 83.5 kg Most recent weight: Weight: 83.5 kg       Palliative Assessment/Data: PPS 40-50%      Patient Active Problem List   Diagnosis Date Noted   Vascular insufficiency of extremity 01/19/2023   Left foot infection 01/19/2023   Pain 01/19/2023   Goals of care, counseling/discussion 01/19/2023   Palliative care by specialist 01/19/2023   Intractable pain 01/19/2023   PAD (peripheral artery disease) ([place]) 01/10/2023   Cellulitis of left lower extremity 07/18/2022   Presbyopia 07/18/2022   Moderate protein malnutrition ([place]) 07/18/2022   GI bleed 07/18/2022   Chronic combined systolic and diastolic CHF (congestive heart failure) ([place]) 07/08/2022   Prolonged QT interval 07/03/2022   Sepsis due to undetermined organism ([place]) 06/25/2022   CKD (chronic kidney disease), symptom management only, stage 4 (severe) ([place]) 09/08/2018   Sepsis ([place]) 09/07/2018   Diabetic ulcer of toe of left foot associated with type 2 diabetes mellitus ([place])    Cellulitis of left anterior lower leg 07/25/2018   Iron deficiency anemia 07/24/2018   Hyponatremia 07/24/2018   Hypokalemia 07/24/2018   Normocytic anemia 07/24/2018   Thrombocytopenia ([place]) 07/24/2018   Ischemic  cardiomyopathy    CAD (coronary artery disease)    PAF (paroxysmal atrial fibrillation) ([place])    Essential hypertension    Type II diabetes mellitus ([place])    Dizziness and giddiness 04/01/2015   Chronic renal disease, stage II 04/01/2015   RBBB 04/01/2015   PAT (paroxysmal atrial tachycardia) ([place]) 12/16/2011   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 Stanford Breed on 07/07/2022, arteriogram on 01/10/2023 indicating unreconstructable disease bilaterally, admitted on 01/19/2023 with severe pain in both legs.    Vascular surgery has seen the patient and advised need for bilateral AKA's.  Patient has also made suicidal comments in the ED upon presentation due to severe pain. PMT has been consulted to assist with pain management and goals of care conversation.  Assessment: Principal Problem:   Intractable pain Active Problems:   Hypertension   Hyperlipidemia   Hx of CABG   CAD (coronary artery disease)   PAF (paroxysmal atrial fibrillation) ([facility])   Essential hypertension   Type II diabetes mellitus ([facility])   Normocytic anemia   CKD (chronic kidney disease), symptom management only, stage 4 (severe) ([facility])   Prolonged QT interval   Chronic combined systolic and diastolic CHF (congestive heart failure) ([facility])   PAD (peripheral artery disease) ([facility])   Vascular insufficiency of extremity   Left foot infection   Pain   Goals of care, counseling/discussion   Palliative care by specialist   Terminal care  Recommendations/Plan: Continue full comfort measures Continue DNR/DNI as previously documented - durable DNR form completed and placed in  shadow chart. Copy was  made and will be scanned into Vynca/ACP tab Patient's goal is to return back to [facility] with hospice - he does not wish to pursue amputation Regular diet ordered, no restrictions Continue current comfort focused medication regimen - no changes PMT will continue to follow and support holistically   Goals of Care and Additional Recommendations: Limitations on Scope of Treatment: Full Comfort Care  Code Status:    Code Status Orders  (From admission, onward)           Start     Ordered   01/19/23 1620  Do not attempt resuscitation (DNR)  Continuous       Question Answer Comment  If patient has no pulse and is not breathing Do Not Attempt Resuscitation   If patient has a pulse and/or is breathing: Medical Treatment Goals COMFORT MEASURES: Keep clean/warm/dry, use medication by any route; positioning, wound care and other measures to relieve pain/suffering; use oxygen, suction/manual treatment of airway obstruction for comfort; do not transfer unless for comfort needs.   Consent: Discussion documented in EHR or advanced directives reviewed      01/19/23 1626           Code Status History     Date Active Date Inactive Code Status Order ID Comments User Context   01/19/2023 1515 01/19/2023 1626 DNR [id number]  Norberta Keens, PA-C ED   01/10/2023 1616 01/11/2023 1355 Full Code [id number]  Waynetta Sandy, MD Inpatient   07/18/2022 1541 07/26/2022 1742 Full Code [id number]  Reubin Milan, MD ED   06/25/2022 1819 07/13/2022 2045 Full Code [id number]  Karmen Bongo, MD ED   04/16/2019 1737 04/18/2019 1633 DNR [id number]  Aline August, MD ED   04/16/2019 1649 04/16/2019 1736 Full Code [id number]  Aline August, MD ED   12/18/2018 1148 12/19/2018 1326 Full Code [id number]  Belva Crome, MD Inpatient   12/15/2018 1155 12/18/2018 1148 Full Code [id number]  Ina Homes, MD ED   09/07/2018 1123 09/10/2018 1751 Full Code [id number]  Guilford Shi,  MD ED   07/24/2018 2242 07/29/2018 1912 Full Code [id number]  Welford Roche, MD ED   04/01/2015 0152 04/01/2015 1919 Full Code [id number]  Lavina Hamman, MD ED   12/12/2011 1030 12/17/2011 1256 Full Code [id number]  Rexene Alberts, MD Inpatient   12/10/2011 1943 12/12/2011 1030 Full Code [id number]  Absher, Nat Math, RN Inpatient      Advance Directive Documentation    Flowsheet Row Most Recent Value  Type of Advance Directive Out of facility DNR (pink MOST or yellow form), Healthcare Power of Attorney, Living will  Pre-existing out of facility DNR order (yellow form or pink MOST form) Pink Most/Yellow Form available - Physician notified to receive inpatient order  "MOST" Form in Place? --       Prognosis:  < 6 months  Discharge Planning: [place] with Hospice  Care plan was discussed with primary RN, primary NT  Thank you for allowing the Palliative Medicine Team to assist in the care of this patient.   Lin Landsman, NP  Please contact Palliative Medicine Team phone at [phone] for questions and concerns.   *Portions of this note are a verbal dictation therefore any spelling and/or grammatical errors are due to the "[employer]" system interpretation.

## 2023-01-20 NOTE — Progress Notes (Addendum)
  Progress Note  VASCULAR SURGERY ASSESSMENT & PLAN:   CRITICAL LIMB ISCHEMIA BILATERALLY: This patient has severe rest pain of the right foot and nonhealing wounds on the left.  He has undergone arteriography has not unreconstructable vascular disease.  The plan was for bilateral above-the-knee amputations and he had been scheduled to see Dr. Luan Pulling on 02/01/2023.  He has decided that he does not want to have amputation and hospice has been consulted.  Vascular surgery will be available as needed.  If there are any issues over the weekend Dr. Trula Slade is on-call.  Gae Gallop, MD 8:22 AM   01/20/2023 7:49 AM * No surgery found *  Subjective:  pain is not as bad in his legs this morning    Vitals:   01/19/23 1801 01/19/23 2020  BP: 138/75 (!) 148/55  Pulse: 72 66  Resp: (!) 22 20  Temp:  98.3 F (36.8 C)  SpO2: 99% 100%    Physical Exam: General:  resting comfortably Cardiac:  regular Lungs:  nonlabored Extremities:  nonhealing wounds on the left foot  CBC    Component Value Date/Time   WBC 8.6 01/19/2023 1235   RBC 2.58 (L) 01/19/2023 1235   HGB 8.1 (L) 01/19/2023 1235   HGB 9.9 (L) 07/24/2018 1423   HCT 24.7 (L) 01/19/2023 1235   PLT 165 01/19/2023 1235   PLT 128 (L) 07/24/2018 1423   MCV 95.7 01/19/2023 1235   MCH 31.4 01/19/2023 1235   MCHC 32.8 01/19/2023 1235   RDW 13.1 01/19/2023 1235   LYMPHSABS 1.0 07/24/2022 0438   MONOABS 0.4 07/24/2022 0438   EOSABS 0.1 07/24/2022 0438   BASOSABS 0.1 07/24/2022 0438    BMET    Component Value Date/Time   NA 132 (L) 01/19/2023 1235   K 3.5 01/19/2023 1235   CL 101 01/19/2023 1235   CO2 18 (L) 01/19/2023 1235   GLUCOSE 87 01/19/2023 1235   BUN 23 01/19/2023 1235   CREATININE 2.09 (H) 01/19/2023 1235   CREATININE 2.51 (H) 08/10/2022 1222   CALCIUM 8.5 (L) 01/19/2023 1235   GFRNONAA 31 (L) 01/19/2023 1235   GFRAA 46 (L) 05/19/2020 1311    INR    Component Value Date/Time   INR 1.4 (H) 07/18/2022 1252      Intake/Output Summary (Last 24 hours) at 01/20/2023 0749 Last data filed at 01/20/2023 0600 Gross per 24 hour  Intake 50 ml  Output 1150 ml  Net -1100 ml      Assessment/Plan:  82 y.o. male with multiple nonhealing wounds of the left foot and rest pain in the right foot   -The patient has undergone angiography which demonstrated no good options for revascularization -Unfortunately he would require bilateral AKAs for his wounds on the left and rest pain on the right. The patient currently does not want to schedule this surgery since he is in the process of going to hospice -We will be available to schedule surgery for the patient if his plans change   Vicente Serene, PA-C [facility] [phone] 01/20/2023 7:49 AM

## 2023-01-20 NOTE — Consult Note (Signed)
Face-to-Face Psychiatry Consult   Reason for Consult: Suicidal statements Referring Physician:  Dr. Erlinda Hong Patient Identification: Steve [name]. MRN:  [id number] Principal Diagnosis: Intractable pain Diagnosis:  Principal Problem:   Intractable pain Active Problems:   Hypertension   Hyperlipidemia   Hx of CABG   CAD (coronary artery disease)   PAF (paroxysmal atrial fibrillation) (HCC)   Essential hypertension   Type II diabetes mellitus (HCC)   Normocytic anemia   CKD (chronic kidney disease), symptom management only, stage 4 (severe) (HCC)   Prolonged QT interval   Chronic combined systolic and diastolic CHF (congestive heart failure) (HCC)   PAD (peripheral artery disease) (HCC)   Vascular insufficiency of extremity   Left foot infection   Pain   Goals of care, counseling/discussion   Palliative care by specialist  Total Time Spent in Direct Patient Care:  I personally spent 45 minutes on the unit in direct patient care. The direct patient care time included face-to-face time with the patient, reviewing the patient's chart, communicating with other professionals, and coordinating care. Greater than 50% of this time was spent in counseling or coordinating care with the patient regarding goals of hospitalization, psycho-education, and discharge planning needs.  Subjective: "I said I would rather kill myself than have my foot amputated."  HPI:   Steve [name]. is a 82 y.o. male patient admitted with left leg and foot pain.  Per chart review, he has a medical history significant of diabetes, PAD status post femoropopliteal bypass, hypertension, hyperlipidemia, systolic and diastolic CHF, chronic QTc, atrial fibrillation, PAT, GERD, CAD status post CABG, CKD 4. Patient has significant arterial disease and is status post femoropopliteal bypass in 2023. Has been followed by vascular surgery and had recent arteriogram with results showing not a candidate for further revascularization  intervention.   Psychiatry consult was requested for concerns for suicidal ideation.  On evaluation today, patient denies any past psychiatric history.  He denies any past self harm.  He specifically denies any suicidal or homicidal ideation, plan, or intent.  He denies any history or present auditory or visual hallucinations.  At 1 time, patient owned weapons, but he no longer has these in his possession.  Patient states that he an assisted living facility, [facility].  Patient states that he feels safe at this facility and has assistance as needed to include for medication management.  Patient is understanding that he has been recommended for hospice.  He states he does not feel ready to die, and hopes to live to be 82 years old, but is also hopeful for support and pain relief.  Patient states his pain has been manageable while in the hospital.  Patient reports normal appetite and sleep.  He describes a normal level of energy and concentration.  He denies anhedonia guilt he is awake, alert, and oriented other than he cannot recall the name of the current president.  He notes that he believes both candidates.  The presidency are too old, and politicians should be unable to work past 84 years old and have to and have term limits. Patient is able to easily contract for safety.  His medications are reviewed, and patient does not desire further medication particularly in regards to mental health treatment.  Past Psychiatric History: None  Risk to Self:  Denies Risk to Others:  Denies Prior Inpatient Therapy:  No Prior Outpatient Therapy:  No  Past Medical History:  Past Medical History:  Diagnosis Date   Atrial fibrillation (  [place]) 12/15/2018   CAD (coronary artery disease)    a. s/p CABG x 29 Nov 2011 with LIMA to LAD, SVG to OM1 and distal LCX, SVG to ramus intermediate   Dehydration with hyponatremia 06/25/2022   Essential hypertension    Hyperlipidemia    Intermittent claudication ([place])     Ischemic cardiomyopathy    a. 03/2015 Echo: EF45-50%, Gr1 DD, mild MR.   Metabolic acidosis [id number]   Near syncope 04/01/2015   PAF (paroxysmal atrial fibrillation) ([place])    a. post op atrial fib 11/2011; short course of amiodarone; stopped 01/25/12   Peripheral arterial disease ([place])    Right bundle branch block    Type II diabetes mellitus [facility])     Past Surgical History:  Procedure Laterality Date   ABDOMINAL AORTOGRAM W/LOWER EXTREMITY Left 07/02/2022   Procedure: ABDOMINAL AORTOGRAM W/LOWER EXTREMITY;  Surgeon: Cherre Robins, MD;  Location: [place] [facility];  Service: Cardiovascular;  Laterality: Left;   ABDOMINAL AORTOGRAM W/LOWER EXTREMITY N/A 01/10/2023   Procedure: ABDOMINAL AORTOGRAM W/LOWER EXTREMITY;  Surgeon: Waynetta Sandy, MD;  Location: [place] [facility];  Service: Cardiovascular;  Laterality: N/A;   AMPUTATION Left 07/09/2022   Procedure: PARTIAL LEFT FIFTH RAY AMPUTATION;  Surgeon: Edrick Kins, DPM;  Location: [facility];  Service: Podiatry;  Laterality: Left;   BIOPSY  07/21/2022   Procedure: BIOPSY;  Surgeon: Wilford Corner, MD;  Location: [place];  Service: Gastroenterology;;   Vilinda Blanks GRAFT FEMORAL-PERONEAL Left 07/07/2022   Procedure: LEFT FEMORAL-PERONEAL ARTERY BYPASS;  Surgeon: Cherre Robins, MD;  Location: [place] [facility];  Service: Vascular;  Laterality: Left;   CARDIAC CATHETERIZATION  01/16/1999   Est. EF of 65% -- Nonobstruction atherosclerotic coronary artery disease -- Normal left ventricular function       CARDIAC ELECTROPHYSIOLOGY STUDY AND ABLATION  05/21/1999   Normal sinus funtion -- Mildly prolonged interatrial conduction times -- Normal A-V node funtion -- Normal His Purkinje system function -- fNo accessory pathway -- No inducible ventricular tachycardia in the presence of the or in the absence of isoproterenol with programmed stimulation or with burst pacing -- Nikki Dom, M.D. [place]    CORONARY ARTERY BYPASS GRAFT  12/10/2011   Procedure: CORONARY ARTERY BYPASS GRAFTING (CABG);  Surgeon: Tharon Aquas Adelene Idler, MD;  Location: [facility];  Service: Open Heart Surgery;  Laterality: N/A;  Coronary Artery bypass graft on pump times four utilizing left internal mammary artery and left saphenous vein harvested endoscopically   ESOPHAGOGASTRODUODENOSCOPY N/A 07/21/2022   Procedure: ESOPHAGOGASTRODUODENOSCOPY (EGD);  Surgeon: Wilford Corner, MD;  Location: [place];  Service: Gastroenterology;  Laterality: N/A;   LEFT HEART CATH AND CORS/GRAFTS ANGIOGRAPHY N/A 12/18/2018   Procedure: LEFT HEART CATH AND CORS/GRAFTS ANGIOGRAPHY;  Surgeon: Belva Crome, MD;  Location: [place];  Service: Cardiovascular;  Laterality: N/A;   LEFT HEART CATHETERIZATION WITH CORONARY ANGIOGRAM N/A 12/09/2011   Procedure: LEFT HEART CATHETERIZATION WITH CORONARY ANGIOGRAM;  Surgeon: Peter M Martinique, MD;  Location: [facility] C[facility] LAB;  Service: Cardiovascular;  Laterality: N/A;   LOWER EXTREMITY ANGIOGRAM Left 07/28/2018   Procedure: Lower Extremity Angiogram;  Surgeon: Angelia Mould, MD;  Location: [place] [facility];  Service: Cardiovascular;  Laterality: Left;   Family History:  Family History  Problem Relation Age of Onset   Stroke Mother 18       Cerebellar hemorrhage   Family Psychiatric  History: None   Social History:  Social History   Substance and Sexual Activity  Alcohol Use Yes   Alcohol/week: 4.0 standard drinks of alcohol   Types: 4 Shots of liquor per week   Comment: 4 "canadian clubs each night"     Social History   Substance and Sexual Activity  Drug Use No    Social History   Socioeconomic History   Marital status: Widowed    Spouse name: Not on file   Number of children: 1   Years of education: Not on file   Highest education level: Not on file  Occupational History   Occupation: Retail buyer    Comment: retired  Tobacco Use   Smoking status: Former     Packs/day: 1.50    Years: 20.00    Additional pack years: 0.00    Total pack years: 30.00    Types: Cigarettes    Quit date: 05/27/1979    Years since quitting: 43.6   Smokeless tobacco: Former  Scientific laboratory technician Use: Never used  Substance and Sexual Activity   Alcohol use: Yes    Alcohol/week: 4.0 standard drinks of alcohol    Types: 4 Shots of liquor per week    Comment: 4 "canadian clubs each night"   Drug use: No   Sexual activity: Yes  Other Topics Concern   Not on file  Social History Narrative   Not on file   Social Determinants of Health   Financial Resource Strain: Not on file  Food Insecurity: No Food Insecurity (01/19/2023)   Hunger Vital Sign    Worried About Running Out of Food in the Last Year: Never true    Ran Out of Food in the Last Year: Never true  Transportation Needs: No Transportation Needs (01/19/2023)   PRAPARE - Hydrologist (Medical): No    Lack of Transportation (Non-Medical): No  Physical Activity: Not on file  Stress: Not on file  Social Connections: Not on file   Additional Social History:    Allergies:   Allergies  Allergen Reactions   Januvia [Sitagliptin] Other (See Comments)    Abdominal pain   Statins Other (See Comments)    Myalgias Muscle weakness Swelling Pain   Keflex [Cephalexin] Itching    Labs:  Results for orders placed or performed during the hospital encounter of 01/19/23 (from the past 48 hour(s))  Blood Cultures x 2 sites     Status: None (Preliminary result)   Collection Time: 01/19/23 12:21 PM   Specimen: BLOOD  Result Value Ref Range   Specimen Description BLOOD RIGHT ANTECUBITAL    Special Requests      BOTTLES DRAWN AEROBIC AND ANAEROBIC Blood Culture adequate volume   Culture      NO GROWTH < 24 HOURS Performed at [facility] [address], [place], [place] [address]    Report Status PENDING   Blood Cultures x 2 sites     Status: None (Preliminary result)    Collection Time: 01/19/23 12:26 PM   Specimen: BLOOD RIGHT FOREARM  Result Value Ref Range   Specimen Description BLOOD RIGHT FOREARM    Special Requests      BOTTLES DRAWN AEROBIC AND ANAEROBIC Blood Culture adequate volume   Culture      NO GROWTH < 24 HOURS Performed at [facility], [place] [address] [place], [place] [address]    Report Status PENDING   CBC  Status: Abnormal   Collection Time: 01/19/23 12:35 PM  Result Value Ref Range   WBC 8.6 4.0 - 10.5 K/uL   RBC 2.58 (L) 4.22 - 5.81 MIL/uL   Hemoglobin 8.1 (L) 13.0 - 17.0 g/dL   HCT 24.7 (L) 39.0 - 52.0 %   MCV 95.7 80.0 - 100.0 fL   MCH 31.4 26.0 - 34.0 pg   MCHC 32.8 30.0 - 36.0 g/dL   RDW 13.1 11.5 - 15.5 %   Platelets 165 150 - 400 K/uL   nRBC 0.0 0.0 - 0.2 %    Comment: Performed at [facility], [place] [address] [place], [place] [address]  Comprehensive metabolic panel     Status: Abnormal   Collection Time: 01/19/23 12:35 PM  Result Value Ref Range   Sodium 132 (L) 135 - 145 mmol/L   Potassium 3.5 3.5 - 5.1 mmol/L   Chloride 101 98 - 111 mmol/L   CO2 18 (L) 22 - 32 mmol/L   Glucose, Bld 87 70 - 99 mg/dL    Comment: Glucose reference range applies only to samples taken after fasting for at least 8 hours.   BUN 23 8 - 23 mg/dL   Creatinine, Ser 2.09 (H) 0.61 - 1.24 mg/dL   Calcium 8.5 (L) 8.9 - 10.3 mg/dL   Total Protein 6.8 6.5 - 8.1 g/dL   Albumin 2.9 (L) 3.5 - 5.0 g/dL   AST 14 (L) 15 - 41 U/L   ALT 9 0 - 44 U/L   Alkaline Phosphatase 63 38 - 126 U/L   Total Bilirubin 0.8 0.3 - 1.2 mg/dL   GFR, Estimated 31 (L) >60 mL/min    Comment: (NOTE) Calculated using the CKD-EPI Creatinine Equation (2021)    Anion gap 13 5 - 15    Comment: Performed at [facility], [place] [address], [place], [place] [address]  Lactic acid, plasma     Status: None   Collection Time: 01/19/23 12:35 PM  Result Value Ref Range   Lactic Acid, Venous 1.8 0.5 - 1.9 mmol/L    Comment: Performed at [facility] [address], [place], [place] [address]  Acetaminophen level     Status: None   Collection Time: 01/19/23  2:05 PM  Result Value Ref Range   Acetaminophen (Tylenol), Serum 12 10 - 30 ug/mL    Comment: (NOTE) Therapeutic concentrations vary significantly. A range of 10-30 ug/mL  may be an effective concentration for many patients. However, some  are best treated at concentrations outside of this range. Acetaminophen concentrations >150 ug/mL at 4 hours after ingestion  and >50 ug/mL at 12 hours after ingestion are often associated with  toxic reactions.  Performed at [facility], [place] [address], [place], [place] [address]   CBG monitoring, ED     Status: Abnormal   Collection Time: 01/19/23  5:38 PM  Result Value Ref Range   Glucose-Capillary 102 (H) 70 - 99 mg/dL    Comment: Glucose reference range applies only to samples taken after fasting for at least 8 hours.  Glucose, capillary     Status: Abnormal   Collection Time: 01/19/23  9:13 PM  Result Value Ref Range   Glucose-Capillary 111 (H) 70 - 99 mg/dL    Comment: Glucose reference range applies only to samples taken after fasting for at least 8 hours.  Glucose, capillary     Status: Abnormal   Collection Time: 01/20/23  9:07 AM  Result Value Ref Range   Glucose-Capillary 114 (H) 70 - 99 mg/dL    Comment: Glucose reference range applies only to samples taken after fasting for at least 8 hours.  Glucose, capillary     Status: Abnormal   Collection Time: 01/20/23 11:35 AM  Result Value Ref Range   Glucose-Capillary 162 (H) 70 - 99 mg/dL    Comment: Glucose reference range applies only to samples taken after fasting for at least 8 hours.    Current Facility-Administered Medications  Medication Dose Route Frequency Provider Last Rate Last Admin   acetaminophen (TYLENOL) tablet 650 mg  650 mg Oral Q6H PRN Marcelyn Bruins, MD       Or   acetaminophen (TYLENOL) suppository 650 mg  650 mg Rectal Q6H PRN  Marcelyn Bruins, MD       antiseptic oral rinse (BIOTENE) solution 15 mL  15 mL Topical PRN Marcelyn Bruins, MD       apixaban [name]) tablet 2.5 mg  2.5 mg Oral BID Marcelyn Bruins, MD   2.5 mg at 01/20/23 0900   diphenhydrAMINE (BENADRYL) injection 12.5 mg  12.5 mg Intravenous Q4H PRN Marcelyn Bruins, MD       ezetimibe (ZETIA) tablet 10 mg  10 mg Oral Daily Marcelyn Bruins, MD   10 mg at 01/20/23 0900   influenza vaccine adjuvanted (FLUAD) injection 0.5 mL  0.5 mL Intramuscular Prior to discharge Marcelyn Bruins, MD       insulin aspart (novoLOG) injection 0-15 Units  0-15 Units Subcutaneous TID WC Marcelyn Bruins, MD       isosorbide mononitrate (IMDUR) 24 hr tablet 30 mg  30 mg Oral Daily Marcelyn Bruins, MD   30 mg at 01/20/23 0900   LORazepam (ATIVAN) tablet 1 mg  1 mg Oral Q4H PRN Marcelyn Bruins, MD   1 mg at 01/20/23 0127   Or   LORazepam (ATIVAN) 2 MG/ML concentrated solution 1 mg  1 mg Sublingual Q4H PRN Marcelyn Bruins, MD       metoprolol succinate (TOPROL-XL) 24 hr tablet 100 mg  100 mg Oral Daily Marcelyn Bruins, MD   100 mg at 01/20/23 0900   morphine (PF) 2 MG/ML injection 2 mg  2 mg Intravenous Q2H PRN Marcelyn Bruins, MD   2 mg at 01/20/23 0127   ondansetron (ZOFRAN-ODT) disintegrating tablet 4 mg  4 mg Oral Q6H PRN Marcelyn Bruins, MD       Or   ondansetron [facility]) injection 4 mg  4 mg Intravenous Q6H PRN Marcelyn Bruins, MD       oxyCODONE (Oxy IR/ROXICODONE) immediate release tablet 10 mg  10 mg Oral Q4H PRN Marcelyn Bruins, MD   10 mg at 01/20/23 0900   polyvinyl alcohol (LIQUIFILM TEARS) 1.4 % ophthalmic solution 1 drop  1 drop Both Eyes QID PRN Marcelyn Bruins, MD       pravastatin (PRAVACHOL) tablet 40 mg  40 mg Oral QHS Marcelyn Bruins, MD       sodium chloride flush (NS) 0.9 % injection 3 mL  3 mL Intravenous Q12H Marcelyn Bruins, MD   3 mL at 01/19/23 2200    Musculoskeletal: Strength &  Muscle Tone: decreased Gait & Station:  Not assessed, patient in bed with inflammation and sores to feet bilaterally Patient leans: N/A            Psychiatric Specialty Exam:  Presentation  General Appearance: Appropriate for Environment; Fairly Groomed  Eye Contact:Good  Speech:Clear and Coherent; Normal Rate  Speech Volume:Normal  Handedness:No data recorded  Mood and Affect  Mood:Euthymic  Affect:Appropriate   Thought Process  Thought Processes:Goal Directed; Linear  Descriptions of Associations:Intact  Orientation:Full (Time, Place and Person)  Thought Content:Logical  History of Schizophrenia/Schizoaffective disorder:No data recorded No Duration of Psychotic Symptoms:No data recorded n/a Hallucinations:Hallucinations: None  Ideas of Reference:None  Suicidal Thoughts:Suicidal Thoughts: No  Homicidal Thoughts:Homicidal Thoughts: No   Sensorium  Memory:Immediate Good; Recent Fair; Remote Good  Judgment:Good  Insight:Good   Executive Functions  Concentration:Good  Attention Span:Good  [place] of Knowledge:Good  Language:Good   Psychomotor Activity  Psychomotor Activity:Psychomotor Activity: Normal   Assets  Assets:Communication Skills; Desire for Improvement; Financial Resources/Insurance; Housing; Resilience; Social Support   Sleep  Sleep:Sleep: Good   Physical Exam: Physical Exam Vitals and nursing note reviewed.  Constitutional:      Appearance: Normal appearance. He is normal weight.  HENT:     Head: Normocephalic.     Nose: No congestion.  Eyes:     Extraocular Movements: Extraocular movements intact.  Cardiovascular:     Rate and Rhythm: Normal rate.  Pulmonary:     Effort: Pulmonary effort is normal. No respiratory distress.  Neurological:     General: No focal deficit present.     Mental Status: He is alert and oriented to person, place, and time.  Psychiatric:        Mood and Affect: Mood  normal.        Behavior: Behavior normal.        Thought Content: Thought content normal.        Judgment: Judgment normal.    Review of Systems  Constitutional:  Negative for chills, fever and malaise/fatigue.  Respiratory:  Negative for cough and shortness of breath.   Cardiovascular:  Negative for chest pain and palpitations.  Musculoskeletal:        Leg and foot pain  Psychiatric/Behavioral:  Negative for depression, hallucinations, memory loss, substance abuse and suicidal ideas. The patient is not nervous/anxious and does not have insomnia.    Blood pressure (!) 145/50, pulse 66, temperature 97.8 F (36.6 C), temperature source Oral, resp. rate 19, height 5\' 9"  (1.753 m), weight 83.5 kg, SpO2 99 %. Body mass index is 27.18 kg/m.  Treatment Plan Summary: Steve [name]. is a 82 y.o. male with no past psychiatric history.  Patient made a suicidal remark in response to consideration of foot amputation.  Patient specifically denies any SI, HI, AVH.  He has no history of self-harm, past psychiatric diagnosis or hospitalization.  He lives in an assisted living facility in which he feels safe assisted living facility in which he feels safe.  He denies having access to methods for self-harm.  I am unable to speak to his medical conditions and life expectancy.  Patient is interested in going on hospice for pain management, however he is hopeful to live in another couple of years.  I would strongly recommend palliative care consult be considered instead of hospice care.  There are no indications for adding psychotropic medications at this time.  Patient is made aware that should he begin to have symptoms of depression or anxiety, he can reach out for further consultation or requests medication from his primary care or palliative care team.  Allow time for patient to ask questions to his satisfaction.  Disposition: No evidence of imminent risk to self  or others at present.   Patient does not meet  criteria for psychiatric inpatient admission. Supportive therapy provided about ongoing stressors. Discussed crisis plan, support from social network, calling 911, coming to the Emergency Department, and calling Suicide Hotline.  Psychiatry will sign off.  Please re-consult for any future acute psychiatric concerns.  While future psychiatric events cannot be accurately predicted, the patient does not currently require acute inpatient psychiatric care and does not currently meet [facility] involuntary commitment criteria.   Lavella Hammock, MD 01/20/2023 12:22 PM

## 2023-01-20 NOTE — NC FL2 (Signed)
[place] LEVEL OF CARE FORM     IDENTIFICATION  Patient Name: Steve [name]. Birthdate: [birth year] Sex: male Admission Date (Current Location): 01/19/2023  [facility] and [place] Number:  Herbalist and Address:  The . [facility], [place] [address] [place], [place] [address]      Provider Number: [id number]  Attending Physician Name and Address:  Florencia Reasons, MD  Relative Name and Phone Number:  [place], [place]  [phone]    Current Level of Care: Hospital Recommended Level of Care: [place] Prior Approval Number:    Date Approved/Denied:   PASRR Number:    Discharge Plan: Other (Comment) [facility])    Current Diagnoses: Patient Active Problem List   Diagnosis Date Noted   Vascular insufficiency of extremity 01/19/2023   Left foot infection 01/19/2023   Pain 01/19/2023   Goals of care, counseling/discussion 01/19/2023   Palliative care by specialist 01/19/2023   Intractable pain 01/19/2023   PAD (peripheral artery disease) ([place]) 01/10/2023   Cellulitis of left lower extremity 07/18/2022   Presbyopia 07/18/2022   Moderate protein malnutrition ([place]) 07/18/2022   GI bleed 07/18/2022   Chronic combined systolic and diastolic CHF (congestive heart failure) ([place]) 07/08/2022   Prolonged QT interval 07/03/2022   Sepsis due to undetermined organism ([place]) 06/25/2022   CKD (chronic kidney disease), symptom management only, stage 4 (severe) ([place]) 09/08/2018   Sepsis ([place]) 09/07/2018   Diabetic ulcer of toe of left foot associated with type 2 diabetes mellitus ([place])    Cellulitis of left anterior lower leg 07/25/2018   Iron deficiency anemia 07/24/2018   Hyponatremia 07/24/2018   Hypokalemia 07/24/2018   Normocytic anemia 07/24/2018   Thrombocytopenia ([place]) 07/24/2018   Ischemic cardiomyopathy    CAD (coronary artery disease)    PAF (paroxysmal atrial fibrillation) ([place])    Essential hypertension    Type II  diabetes mellitus ([place])    Dizziness and giddiness 04/01/2015   Chronic renal disease, stage II 04/01/2015   RBBB 04/01/2015   PAT (paroxysmal atrial tachycardia) ([place]) 12/16/2011   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   Hypertension    PVD (peripheral vascular disease) (HCC)    Hyperlipidemia     Orientation RESPIRATION BLADDER Height & Weight     Self, Time, Situation, Place  Normal Continent Weight: 184 lb 1.4 oz (83.5 kg) Height:  5\' 9"  (175.3 cm)  BEHAVIORAL SYMPTOMS/MOOD NEUROLOGICAL BOWEL NUTRITION STATUS      Incontinent Diet (Carb Modified)  AMBULATORY STATUS COMMUNICATION OF NEEDS Skin   Limited Assist Verbally Skin abrasions (Foot and leg wounds)                       Personal Care Assistance Level of Assistance  Bathing, Feeding, Dressing Bathing Assistance: Limited assistance Feeding assistance: Independent Dressing Assistance: Limited assistance     Functional Limitations Info  Sight, Hearing, Speech Sight Info: Adequate Hearing Info: Adequate Speech Info: Adequate    SPECIAL CARE FACTORS FREQUENCY                       Contractures Contractures Info: Not present    Additional Factors Info  Code Status, Allergies, Insulin Sliding Scale Code Status Info: DNR Allergies Info: Januvia (Sitagliptin)  Statins  Keflex (Cephalexin)           Current Medications (01/20/2023):  This is the current hospital active medication list Current Facility-Administered Medications  Medication Dose  Route Frequency Provider Last Rate Last Admin   acetaminophen (TYLENOL) tablet 650 mg  650 mg Oral Q6H PRN Marcelyn Bruins, MD       Or   acetaminophen (TYLENOL) suppository 650 mg  650 mg Rectal Q6H PRN Marcelyn Bruins, MD       antiseptic oral rinse (BIOTENE) solution 15 mL  15 mL Topical PRN Marcelyn Bruins, MD       apixaban [name]) tablet 2.5 mg  2.5 mg Oral BID Marcelyn Bruins, MD   2.5 mg at 01/20/23 0900   diphenhydrAMINE (BENADRYL) injection 12.5 mg   12.5 mg Intravenous Q4H PRN Marcelyn Bruins, MD       ezetimibe (ZETIA) tablet 10 mg  10 mg Oral Daily Marcelyn Bruins, MD   10 mg at 01/20/23 0900   influenza vaccine adjuvanted (FLUAD) injection 0.5 mL  0.5 mL Intramuscular Prior to discharge Marcelyn Bruins, MD       insulin aspart (novoLOG) injection 0-15 Units  0-15 Units Subcutaneous TID WC Marcelyn Bruins, MD       isosorbide mononitrate (IMDUR) 24 hr tablet 30 mg  30 mg Oral Daily Marcelyn Bruins, MD   30 mg at 01/20/23 0900   LORazepam (ATIVAN) tablet 1 mg  1 mg Oral Q4H PRN Marcelyn Bruins, MD   1 mg at 01/20/23 0127   Or   LORazepam (ATIVAN) 2 MG/ML concentrated solution 1 mg  1 mg Sublingual Q4H PRN Marcelyn Bruins, MD       metoprolol succinate (TOPROL-XL) 24 hr tablet 100 mg  100 mg Oral Daily Marcelyn Bruins, MD   100 mg at 01/20/23 0900   morphine (PF) 2 MG/ML injection 2 mg  2 mg Intravenous Q2H PRN Marcelyn Bruins, MD   2 mg at 01/20/23 0127   ondansetron (ZOFRAN-ODT) disintegrating tablet 4 mg  4 mg Oral Q6H PRN Marcelyn Bruins, MD       Or   ondansetron [facility]) injection 4 mg  4 mg Intravenous Q6H PRN Marcelyn Bruins, MD       oxyCODONE (Oxy IR/ROXICODONE) immediate release tablet 10 mg  10 mg Oral Q4H PRN Marcelyn Bruins, MD   10 mg at 01/20/23 0900   polyvinyl alcohol (LIQUIFILM TEARS) 1.4 % ophthalmic solution 1 drop  1 drop Both Eyes QID PRN Marcelyn Bruins, MD       pravastatin (PRAVACHOL) tablet 40 mg  40 mg Oral QHS Marcelyn Bruins, MD       sodium chloride flush (NS) 0.9 % injection 3 mL  3 mL Intravenous Q12H Marcelyn Bruins, MD   3 mL at 01/19/23 2200     Discharge Medications:  acetaminophen 500 MG tablet Commonly known as: TYLENOL Take 500 mg by mouth every 6 (six) hours as needed for moderate pain.    apixaban 2.5 MG Tabs tablet Commonly known as: ELIQUIS Take 2.5 mg by mouth 2 (two) times daily.    cyanocobalamin 1000 MCG tablet Commonly  known as: VITAMIN B12 Take 1,000 mcg by mouth daily.    doxazosin 2 MG tablet Commonly known as: CARDURA Take 2 mg by mouth daily.    ezetimibe 10 MG tablet Commonly known as: ZETIA Take 1 tablet (10 mg total) by mouth daily.    gabapentin 300 MG capsule Commonly known as: NEURONTIN Take 300 mg by mouth 2 (two) times daily.    HYDROcodone-acetaminophen 5-325 MG tablet Commonly known as:  NORCO/VICODIN Take 1 tablet by mouth every 8 (eight) hours as needed (pain).    hydrOXYzine 10 MG tablet Commonly known as: ATARAX Take 10 mg by mouth 2 (two) times daily as needed for itching.    isosorbide mononitrate 30 MG 24 hr tablet Commonly known as: IMDUR Take 1 tablet (30 mg total) by mouth daily.    Lantus SoloStar 100 UNIT/ML Solostar Pen Generic drug: insulin glargine Inject 10 Units into the skin daily.    loperamide 2 MG tablet Commonly known as: IMODIUM A-D Take 2 mg by mouth every 6 (six) hours as needed for diarrhea or loose stools.    metoprolol succinate 100 MG 24 hr tablet Commonly known as: TOPROL-XL Take 1 tablet (100 mg total) by mouth daily. Take with or immediately following a meal.    pantoprazole 20 MG tablet Commonly known as: PROTONIX Take 20 mg by mouth 2 (two) times daily.    pantoprazole 40 MG tablet Commonly known as: PROTONIX Take 1 tablet (40 mg total) by mouth 2 (two) times daily.    pravastatin 40 MG tablet Commonly known as: PRAVACHOL Take 40 mg by mouth at bedtime.    tamsulosin 0.4 MG Caps capsule Commonly known as: FLOMAX Take 0.4 mg by mouth daily.    traMADol 50 MG tablet Commonly known as: ULTRAM Take 50 mg by mouth every 8 (eight) hours as needed (pain).    Vitamin D3 50 MCG (2000 UT) Caps Generic drug: Cholecalciferol Take 2,000 Units by mouth daily.      Relevant Imaging Results:  Relevant Lab Results:   Additional Information SSN: [id number]. [facility] with [place]  Meiya Wisler, LCSWA

## 2023-01-21 ENCOUNTER — Encounter (HOSPITAL_COMMUNITY): Payer: Self-pay | Admitting: Emergency Medicine

## 2023-01-21 ENCOUNTER — Emergency Department (HOSPITAL_COMMUNITY)
Admission: EM | Admit: 2023-01-21 | Discharge: 2023-01-21 | Disposition: A | Discharge status: Skilled Nursing Facility | Attending: Emergency Medicine | Admitting: Emergency Medicine

## 2023-01-21 ENCOUNTER — Other Ambulatory Visit: Payer: Self-pay

## 2023-01-21 DIAGNOSIS — Z7901 Long term (current) use of anticoagulants: Secondary | ICD-10-CM | POA: Insufficient documentation

## 2023-01-21 DIAGNOSIS — M79672 Pain in left foot: Secondary | ICD-10-CM | POA: Diagnosis present

## 2023-01-21 DIAGNOSIS — L97529 Non-pressure chronic ulcer of other part of left foot with unspecified severity: Secondary | ICD-10-CM | POA: Insufficient documentation

## 2023-01-21 DIAGNOSIS — R58 Hemorrhage, not elsewhere classified: Secondary | ICD-10-CM

## 2023-01-21 NOTE — Discharge Instructions (Signed)
Please keep dressing in place Elevate foot when not ambulating. Use pressure dressing if worsening bleeding

## 2023-01-21 NOTE — ED Triage Notes (Signed)
Pt BIB [facility] from [facility] due to bleeding left foot were sores are located.  Pt does have cellulitis that he is taking prescription medicine for.  Pt does takes Eliquis.  Pt is diabetic.  VS BP 150/90, Pulse 82, Spo2 94%, CBG 216

## 2023-01-21 NOTE — ED Provider Notes (Signed)
[facility] Provider Note   CSN: [id number] Arrival date & time: 01/21/23  1012     History  Chief Complaint  Patient presents with   Foot Pain    Steve [name]. is a 82 y.o. male.  HPI 82 year old male history of severe peripheral vascular disease, on hospice care, presents today with concern for foot bleeding.  Patient's history is well-known to me as I cared for him several days ago.  He reports that now he is back at his assisted living facility and has had good pain control.  He has transition to hospice care.  Today he was walking and they noted a trail of blood from his left foot.  They thought it was bleeding from his heel.  Dressing was placed and patient was transported here for evaluation  Patient does not feel lightheaded, chest pain, or dyspnea  Home Medications Prior to Admission medications   Medication Sig Start Date End Date Taking? Authorizing Provider  acetaminophen (TYLENOL) 500 MG tablet Take 500 mg by mouth every 6 (six) hours as needed for moderate pain.    [provider]  apixaban (ELIQUIS) 2.5 MG TABS tablet Take 2.5 mg by mouth 2 (two) times daily. 11/23/21   [provider]  Cholecalciferol (VITAMIN D3) 50 MCG (2000 UT) capsule Take 2,000 Units by mouth daily. 11/23/21   [provider]  cyanocobalamin (VITAMIN B12) 1000 MCG tablet Take 1,000 mcg by mouth daily.    [provider]  doxazosin (CARDURA) 2 MG tablet Take 2 mg by mouth daily. 11/30/21   [provider]  ezetimibe (ZETIA) 10 MG tablet Take 1 tablet (10 mg total) by mouth daily. 07/24/22   Thurnell Lose, MD  gabapentin (NEURONTIN) 300 MG capsule Take 300 mg by mouth 2 (two) times daily.    [provider]  HYDROcodone-acetaminophen (NORCO/VICODIN) 5-325 MG tablet Take 1 tablet by mouth every 8 (eight) hours as needed (pain). 12/27/22   [provider]  hydrOXYzine (ATARAX) 10 MG tablet Take  10 mg by mouth 2 (two) times daily as needed for itching. 11/08/22   [provider]  insulin glargine (LANTUS SOLOSTAR) 100 UNIT/ML Solostar Pen Inject 10 Units into the skin daily.    [provider]  isosorbide mononitrate (IMDUR) 30 MG 24 hr tablet Take 1 tablet (30 mg total) by mouth daily. 07/13/22   Kathie Dike, MD  loperamide (IMODIUM A-D) 2 MG tablet Take 2 mg by mouth every 6 (six) hours as needed for diarrhea or loose stools.    [provider]  metoprolol succinate (TOPROL-XL) 100 MG 24 hr tablet Take 1 tablet (100 mg total) by mouth daily. Take with or immediately following a meal. 12/19/18   Reino Bellis B, NP  pantoprazole (PROTONIX) 20 MG tablet Take 20 mg by mouth 2 (two) times daily.    [provider]  pantoprazole (PROTONIX) 40 MG tablet Take 1 tablet (40 mg total) by mouth 2 (two) times daily. Patient not taking: Reported on 01/19/2023 07/24/22   Thurnell Lose, MD  pravastatin (PRAVACHOL) 40 MG tablet Take 40 mg by mouth at bedtime.    [provider]  tamsulosin (FLOMAX) 0.4 MG CAPS capsule Take 0.4 mg by mouth daily.    [provider]  traMADol (ULTRAM) 50 MG tablet Take 50 mg by mouth every 8 (eight) hours as needed (pain). 11/01/22   [provider]  amLODipine (NORVASC) 10 MG tablet  TAKE 1 TABLET DAILY Patient not taking: Reported on 05/20/2020 11/09/19 05/20/20  Lendon Colonel, NP      Allergies    Januvia [sitagliptin], Statins, and Keflex [cephalexin]    Review of Systems   Review of Systems  Physical Exam Updated Vital Signs BP (!) 142/57 (BP Location: Right Arm)   Pulse (!) 50   Temp 97.9 F (36.6 C) (Oral)   Resp 20   Ht 1.753 m (5\' 9" )   Wt 83.5 kg   SpO2 97%   BMI 27.17 kg/m  Physical Exam Vitals reviewed.  HENT:     Head: Normocephalic.     Right Ear: External ear normal.     Left Ear: External ear normal.     Mouth/Throat:     Mouth: Mucous membranes are dry.   Cardiovascular:     Rate and Rhythm: Normal rate.  Pulmonary:     Effort: Pulmonary effort is normal.  Musculoskeletal:     Comments: Left foot examined with chronic wounds and no active bleeding noted. Wounds to top of foot and heel could have been source of bleeding are not actively bleeding have  Skin:    Capillary Refill: Capillary refill takes less than 2 seconds.  Neurological:     Mental Status: He is alert.  Psychiatric:        Mood and Affect: Mood normal.     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 Nonspecific heterogeneous density along the distal flap margin is probably incidental and less likely to represent gas in the soft tissues. Substantial vascular calcification noted. Faint density along the medial margin of the great toe and fifth metatarsal, probably from bandaging. No bony destructive findings characteristic of active osteomyelitis identified. IMPRESSION: 1. 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 No bony destructive findings characteristic of active osteomyelitis. 2. Substantial vascular calcification. 3. Probable bandaging along the medial distal foot and great toe. Electronically Signed   By: Van Clines M.D.   On: 01/19/2023 12:17    Procedures Procedures    Medications Ordered in ED Medications - No data to display  ED Course/ Medical Decision Making/ A&P                             Medical Decision Making  82 year old man with peripheral vascular disease, now on hospice care presents today with bleeding from chronic wound on left foot.  Patient  appears hemodynamically stable and is not tachycardic and has normal blood pressure.  I doubt he has had significant blood loss.  Wounds were examined and there is no evidence of active bleeding at this time.  Plan nonstick dressing,. Patient advised regarding keeping foot elevated as much as possible.  He is instructed regarding placing pressure if there is any return of bleeding. He is advised he can return for reevaluation anytime       Final Clinical Impression(s) / ED Diagnoses Final diagnoses:  Bleeding    Rx / DC Orders ED Discharge Orders     None  Pattricia Boss, MD 01/21/23 [phone]

## 2023-01-24 LAB — CULTURE, BLOOD (ROUTINE X 2)
Culture: NO GROWTH
Culture: NO GROWTH
Special Requests: ADEQUATE
Special Requests: ADEQUATE

## 2023-02-01 ENCOUNTER — Encounter: Payer: Medicare Other | Admitting: Vascular Surgery

## 2023-02-23 DEATH — deceased
